# Patient Record
Sex: Female | Born: 1955 | Race: White | Hispanic: No | Smoking: Current every day smoker
Health system: Southern US, Community
[De-identification: ages and names within clinical notes are randomized; demographics above are authoritative.]

## PROBLEM LIST (undated history)

## (undated) DIAGNOSIS — D509 Iron deficiency anemia, unspecified: Secondary | ICD-10-CM

## (undated) DIAGNOSIS — J449 Chronic obstructive pulmonary disease, unspecified: Secondary | ICD-10-CM

## (undated) DIAGNOSIS — K589 Irritable bowel syndrome without diarrhea: Secondary | ICD-10-CM

## (undated) DIAGNOSIS — M545 Low back pain, unspecified: Secondary | ICD-10-CM

## (undated) DIAGNOSIS — R51 Headache: Secondary | ICD-10-CM

## (undated) DIAGNOSIS — F429 Obsessive-compulsive disorder, unspecified: Secondary | ICD-10-CM

## (undated) DIAGNOSIS — G8929 Other chronic pain: Secondary | ICD-10-CM

## (undated) DIAGNOSIS — K635 Polyp of colon: Secondary | ICD-10-CM

## (undated) DIAGNOSIS — M1990 Unspecified osteoarthritis, unspecified site: Secondary | ICD-10-CM

## (undated) DIAGNOSIS — F419 Anxiety disorder, unspecified: Secondary | ICD-10-CM

## (undated) DIAGNOSIS — Z9981 Dependence on supplemental oxygen: Secondary | ICD-10-CM

## (undated) DIAGNOSIS — R519 Headache, unspecified: Secondary | ICD-10-CM

## (undated) DIAGNOSIS — Z8489 Family history of other specified conditions: Secondary | ICD-10-CM

## (undated) DIAGNOSIS — F329 Major depressive disorder, single episode, unspecified: Secondary | ICD-10-CM

## (undated) DIAGNOSIS — K7460 Unspecified cirrhosis of liver: Secondary | ICD-10-CM

## (undated) DIAGNOSIS — J9610 Chronic respiratory failure, unspecified whether with hypoxia or hypercapnia: Secondary | ICD-10-CM

## (undated) DIAGNOSIS — I5033 Acute on chronic diastolic (congestive) heart failure: Secondary | ICD-10-CM

## (undated) DIAGNOSIS — F32A Depression, unspecified: Secondary | ICD-10-CM

## (undated) DIAGNOSIS — E119 Type 2 diabetes mellitus without complications: Secondary | ICD-10-CM

## (undated) DIAGNOSIS — F410 Panic disorder [episodic paroxysmal anxiety] without agoraphobia: Secondary | ICD-10-CM

## (undated) DIAGNOSIS — R011 Cardiac murmur, unspecified: Secondary | ICD-10-CM

## (undated) DIAGNOSIS — J189 Pneumonia, unspecified organism: Secondary | ICD-10-CM

## (undated) DIAGNOSIS — Z87891 Personal history of nicotine dependence: Secondary | ICD-10-CM

## (undated) DIAGNOSIS — E7800 Pure hypercholesterolemia, unspecified: Secondary | ICD-10-CM

## (undated) DIAGNOSIS — J42 Unspecified chronic bronchitis: Secondary | ICD-10-CM

## (undated) DIAGNOSIS — Z9289 Personal history of other medical treatment: Secondary | ICD-10-CM

## (undated) DIAGNOSIS — J45909 Unspecified asthma, uncomplicated: Secondary | ICD-10-CM

## (undated) DIAGNOSIS — K219 Gastro-esophageal reflux disease without esophagitis: Secondary | ICD-10-CM

## (undated) DIAGNOSIS — G4730 Sleep apnea, unspecified: Secondary | ICD-10-CM

## (undated) DIAGNOSIS — F4310 Post-traumatic stress disorder, unspecified: Secondary | ICD-10-CM

## (undated) DIAGNOSIS — D649 Anemia, unspecified: Secondary | ICD-10-CM

## (undated) DIAGNOSIS — I10 Essential (primary) hypertension: Secondary | ICD-10-CM

## (undated) HISTORY — DX: Depression, unspecified: F32.A

## (undated) HISTORY — DX: Anemia, unspecified: D64.9

## (undated) HISTORY — PX: UPPER GASTROINTESTINAL ENDOSCOPY: SHX188

## (undated) HISTORY — DX: Iron deficiency anemia, unspecified: D50.9

## (undated) HISTORY — DX: Irritable bowel syndrome, unspecified: K58.9

## (undated) HISTORY — DX: Personal history of nicotine dependence: Z87.891

## (undated) HISTORY — DX: Gastro-esophageal reflux disease without esophagitis: K21.9

## (undated) HISTORY — DX: Chronic obstructive pulmonary disease, unspecified: J44.9

## (undated) HISTORY — PX: BACK SURGERY: SHX140

## (undated) HISTORY — DX: Polyp of colon: K63.5

## (undated) HISTORY — DX: Unspecified cirrhosis of liver: K74.60

## (undated) HISTORY — DX: Essential (primary) hypertension: I10

## (undated) HISTORY — DX: Major depressive disorder, single episode, unspecified: F32.9

## (undated) HISTORY — PX: DILATION AND CURETTAGE OF UTERUS: SHX78

---

## 1977-02-23 HISTORY — PX: TUBAL LIGATION: SHX77

## 2003-02-24 HISTORY — PX: CATARACT EXTRACTION W/ INTRAOCULAR LENS  IMPLANT, BILATERAL: SHX1307

## 2003-11-25 ENCOUNTER — Emergency Department: Payer: Self-pay | Admitting: Emergency Medicine

## 2003-12-18 ENCOUNTER — Ambulatory Visit: Payer: Self-pay | Admitting: Ophthalmology

## 2003-12-25 ENCOUNTER — Ambulatory Visit: Payer: Self-pay

## 2004-01-12 ENCOUNTER — Emergency Department: Payer: Self-pay | Admitting: Emergency Medicine

## 2004-01-13 ENCOUNTER — Emergency Department: Payer: Self-pay | Admitting: Unknown Physician Specialty

## 2004-01-22 ENCOUNTER — Ambulatory Visit: Payer: Self-pay | Admitting: Ophthalmology

## 2007-02-24 HISTORY — PX: INCISION AND DRAINAGE OF WOUND: SHX1803

## 2007-05-26 ENCOUNTER — Other Ambulatory Visit: Payer: Self-pay

## 2007-05-26 ENCOUNTER — Inpatient Hospital Stay: Payer: Self-pay | Admitting: Internal Medicine

## 2009-04-04 ENCOUNTER — Emergency Department: Payer: Self-pay | Admitting: Unknown Physician Specialty

## 2009-11-07 ENCOUNTER — Emergency Department: Payer: Self-pay | Admitting: Unknown Physician Specialty

## 2009-12-24 ENCOUNTER — Ambulatory Visit: Payer: Self-pay | Admitting: Internal Medicine

## 2010-04-16 ENCOUNTER — Ambulatory Visit: Payer: Self-pay | Admitting: Gastroenterology

## 2010-05-16 ENCOUNTER — Emergency Department: Payer: Self-pay | Admitting: Emergency Medicine

## 2010-06-15 ENCOUNTER — Inpatient Hospital Stay: Payer: Self-pay | Admitting: Internal Medicine

## 2010-06-25 ENCOUNTER — Emergency Department: Payer: Self-pay | Admitting: Emergency Medicine

## 2010-07-09 ENCOUNTER — Ambulatory Visit: Payer: Self-pay | Admitting: Gastroenterology

## 2011-05-21 ENCOUNTER — Ambulatory Visit: Payer: Self-pay | Admitting: Internal Medicine

## 2011-08-13 ENCOUNTER — Other Ambulatory Visit: Payer: Self-pay | Admitting: Internal Medicine

## 2011-08-13 LAB — BASIC METABOLIC PANEL
Creatinine: 1.05 mg/dL (ref 0.60–1.30)
EGFR (African American): >60
Glucose: 203 mg/dL — ABNORMAL HIGH (ref 65–99)
Osmolality: 256 (ref 275–301)

## 2011-08-14 ENCOUNTER — Other Ambulatory Visit: Payer: Self-pay | Admitting: Internal Medicine

## 2011-08-14 LAB — BASIC METABOLIC PANEL
BUN: 16 mg/dL (ref 7–18)
Calcium, Total: 8.2 mg/dL — ABNORMAL LOW (ref 8.5–10.1)
Chloride: 87 mmol/L — ABNORMAL LOW (ref 98–107)
Creatinine: 1.13 mg/dL (ref 0.60–1.30)
EGFR (Non-African Amer.): 54 — ABNORMAL LOW
Glucose: 244 mg/dL — ABNORMAL HIGH (ref 65–99)
Osmolality: 255 (ref 275–301)
Potassium: 4.7 mmol/L (ref 3.5–5.1)

## 2011-08-17 ENCOUNTER — Inpatient Hospital Stay: Payer: Self-pay | Admitting: Internal Medicine

## 2011-08-17 LAB — SEDIMENTATION RATE: Erythrocyte Sed Rate: 58 mm/hr — ABNORMAL HIGH (ref 0–30)

## 2011-08-17 LAB — TSH: Thyroid Stimulating Horm: 1.9 u[IU]/mL

## 2011-08-17 LAB — URINALYSIS, COMPLETE
Bilirubin,UR: NEGATIVE
Blood: NEGATIVE
Ketone: NEGATIVE
Nitrite: NEGATIVE
Ph: 7 (ref 4.5–8.0)
Protein: NEGATIVE
RBC,UR: 1 /HPF (ref 0–5)
Squamous Epithelial: 2

## 2011-08-17 LAB — CBC
MCH: 29.4 pg (ref 26.0–34.0)
MCHC: 33.1 g/dL (ref 32.0–36.0)
MCV: 89 fL (ref 80–100)
Platelet: 208 10*3/uL (ref 150–440)
RBC: 3.77 10*6/uL — ABNORMAL LOW (ref 3.80–5.20)

## 2011-08-17 LAB — HEPATIC FUNCTION PANEL A (ARMC)
Albumin: 3.1 g/dL — ABNORMAL LOW (ref 3.4–5.0)
Bilirubin,Total: 0.7 mg/dL (ref 0.2–1.0)
SGOT(AST): 27 U/L (ref 15–37)
SGPT (ALT): 17 U/L

## 2011-08-17 LAB — BASIC METABOLIC PANEL
Anion Gap: 7 (ref 7–16)
Calcium, Total: 8.8 mg/dL (ref 8.5–10.1)
Co2: 28 mmol/L (ref 21–32)
Creatinine: 1.2 mg/dL (ref 0.60–1.30)
EGFR (African American): 58 — ABNORMAL LOW
Potassium: 4.2 mmol/L (ref 3.5–5.1)

## 2011-08-17 LAB — TROPONIN I: Troponin-I: <0.02 ng/mL

## 2011-08-17 LAB — CK TOTAL AND CKMB (NOT AT ARMC)
CK, Total: 30 U/L (ref 21–215)
CK-MB: 1 ng/mL (ref 0.5–3.6)

## 2011-08-18 LAB — MAGNESIUM: Magnesium: 1.4 mg/dL — ABNORMAL LOW

## 2011-08-18 LAB — BASIC METABOLIC PANEL
Anion Gap: 4 — ABNORMAL LOW (ref 7–16)
BUN: 14 mg/dL (ref 7–18)
Calcium, Total: 8 mg/dL — ABNORMAL LOW (ref 8.5–10.1)
Chloride: 91 mmol/L — ABNORMAL LOW (ref 98–107)
Co2: 28 mmol/L (ref 21–32)
Creatinine: 1.01 mg/dL (ref 0.60–1.30)
EGFR (African American): >60
EGFR (Non-African Amer.): >60
Glucose: 138 mg/dL — ABNORMAL HIGH (ref 65–99)
Osmolality: 250 (ref 275–301)
Potassium: 4.1 mmol/L (ref 3.5–5.1)
Sodium: 123 mmol/L — ABNORMAL LOW (ref 136–145)

## 2011-08-18 LAB — CBC WITH DIFFERENTIAL/PLATELET
Basophil #: 0 10*3/uL (ref 0.0–0.1)
Basophil %: 0.3 %
Eosinophil #: 0.1 10*3/uL (ref 0.0–0.7)
Eosinophil %: 1.2 %
HCT: 28.2 % — ABNORMAL LOW (ref 35.0–47.0)
Lymphocyte #: 0.5 10*3/uL — ABNORMAL LOW (ref 1.0–3.6)
Lymphocyte %: 12.4 %
MCHC: 34.2 g/dL (ref 32.0–36.0)
MCV: 87 fL (ref 80–100)
Monocyte #: 0.2 x10 3/mm (ref 0.2–0.9)
Monocyte %: 4.4 %
Neutrophil #: 3.6 10*3/uL (ref 1.4–6.5)
Neutrophil %: 81.7 %
WBC: 4.4 10*3/uL (ref 3.6–11.0)

## 2011-08-18 LAB — TSH: Thyroid Stimulating Horm: 0.984 u[IU]/mL

## 2011-08-18 LAB — POTASSIUM, URINE RANDOM: Potassium, Urine Random: 3 mmol/L — ABNORMAL LOW (ref 55–125)

## 2011-08-18 LAB — OSMOLALITY, URINE: Osmolality: 145 mOsm/kg

## 2011-08-18 LAB — CHLORIDE, URINE, RANDOM: Chloride, Urine Random: 42 mmol/L — ABNORMAL LOW (ref 55–125)

## 2011-08-19 LAB — BASIC METABOLIC PANEL
Anion Gap: 8 (ref 7–16)
BUN: 11 mg/dL (ref 7–18)
Calcium, Total: 8.4 mg/dL — ABNORMAL LOW (ref 8.5–10.1)
Creatinine: 0.92 mg/dL (ref 0.60–1.30)
EGFR (African American): >60
EGFR (Non-African Amer.): >60
Glucose: 128 mg/dL — ABNORMAL HIGH (ref 65–99)
Potassium: 4.6 mmol/L (ref 3.5–5.1)

## 2011-08-19 LAB — MAGNESIUM: Magnesium: 1.4 mg/dL — ABNORMAL LOW

## 2011-08-19 LAB — IRON AND TIBC: Iron Saturation: 7 %

## 2011-08-19 LAB — URIC ACID: Uric Acid: 3.4 mg/dL (ref 2.6–6.0)

## 2011-08-21 LAB — PROTEIN ELECTROPHORESIS(ARMC)

## 2011-08-21 LAB — KAPPA/LAMBDA FREE LIGHT CHAINS (ARMC)

## 2011-10-16 ENCOUNTER — Other Ambulatory Visit: Payer: Self-pay | Admitting: Internal Medicine

## 2011-10-16 ENCOUNTER — Ambulatory Visit: Payer: Self-pay | Admitting: Internal Medicine

## 2011-10-16 ENCOUNTER — Inpatient Hospital Stay: Payer: Self-pay | Admitting: Internal Medicine

## 2011-10-16 LAB — CBC WITH DIFFERENTIAL/PLATELET
Eosinophil #: 0.1 10*3/uL (ref 0.0–0.7)
HCT: 24.5 % — ABNORMAL LOW (ref 35.0–47.0)
Lymphocyte #: 1 10*3/uL (ref 1.0–3.6)
Lymphocyte %: 22.5 %
MCH: 22.7 pg — ABNORMAL LOW (ref 26.0–34.0)
MCV: 74 fL — ABNORMAL LOW (ref 80–100)
Monocyte #: 0.3 x10 3/mm (ref 0.2–0.9)
Monocyte %: 6.2 %
RBC: 3.32 10*6/uL — ABNORMAL LOW (ref 3.80–5.20)
RDW: 17.5 % — ABNORMAL HIGH (ref 11.5–14.5)

## 2011-10-16 LAB — IRON AND TIBC
Iron Bind.Cap.(Total): 412 ug/dL (ref 250–450)
Iron Saturation: 5 %
Iron: 22 ug/dL — ABNORMAL LOW (ref 50–170)
Unbound Iron-Bind.Cap.: 390 ug/dL

## 2011-10-16 LAB — RETICULOCYTES: Absolute Retic Count: 0.0809 10*6/uL (ref 0.023–0.096)

## 2011-10-17 LAB — HEMOGLOBIN
HGB: 8.2 g/dL — ABNORMAL LOW (ref 12.0–16.0)
HGB: 9.3 g/dL — ABNORMAL LOW (ref 12.0–16.0)

## 2011-10-22 ENCOUNTER — Ambulatory Visit: Payer: Self-pay | Admitting: Internal Medicine

## 2011-10-22 LAB — CREATININE, SERUM
Creatinine: 1.04 mg/dL (ref 0.60–1.30)
EGFR (Non-African Amer.): 60 — ABNORMAL LOW

## 2011-10-22 LAB — CANCER CENTER HEMOGLOBIN: HGB: 9.6 g/dL — ABNORMAL LOW (ref 12.0–16.0)

## 2011-10-23 LAB — SODIUM: Sodium: 137 mmol/L (ref 136–145)

## 2011-10-25 ENCOUNTER — Ambulatory Visit: Payer: Self-pay | Admitting: Internal Medicine

## 2011-11-24 ENCOUNTER — Ambulatory Visit: Payer: Self-pay | Admitting: Internal Medicine

## 2011-12-14 ENCOUNTER — Ambulatory Visit: Payer: Self-pay | Admitting: Internal Medicine

## 2011-12-14 LAB — CANCER CENTER HEMOGLOBIN: HGB: 9.9 g/dL — ABNORMAL LOW (ref 12.0–16.0)

## 2011-12-22 ENCOUNTER — Other Ambulatory Visit: Payer: Self-pay | Admitting: Internal Medicine

## 2011-12-22 LAB — BASIC METABOLIC PANEL
BUN: 12 mg/dL (ref 7–18)
Chloride: 99 mmol/L (ref 98–107)
Co2: 25 mmol/L (ref 21–32)
Creatinine: 1.01 mg/dL (ref 0.60–1.30)
Osmolality: 271 (ref 275–301)
Potassium: 4.6 mmol/L (ref 3.5–5.1)
Sodium: 134 mmol/L — ABNORMAL LOW (ref 136–145)

## 2011-12-25 ENCOUNTER — Ambulatory Visit: Payer: Self-pay

## 2011-12-25 ENCOUNTER — Ambulatory Visit: Payer: Self-pay | Admitting: Internal Medicine

## 2012-01-24 ENCOUNTER — Ambulatory Visit: Payer: Self-pay | Admitting: Internal Medicine

## 2012-02-08 LAB — CANCER CENTER HEMOGLOBIN: HGB: 12.4 g/dL (ref 12.0–16.0)

## 2012-02-08 LAB — FERRITIN: Ferritin (ARMC): 18 ng/mL (ref 8–388)

## 2012-02-24 ENCOUNTER — Ambulatory Visit: Payer: Self-pay | Admitting: Internal Medicine

## 2012-02-24 HISTORY — PX: COLONOSCOPY: SHX174

## 2012-02-28 ENCOUNTER — Emergency Department: Payer: Self-pay | Admitting: Internal Medicine

## 2012-02-28 LAB — CK TOTAL AND CKMB (NOT AT ARMC): CK-MB: 1.6 ng/mL (ref 0.5–3.6)

## 2012-02-28 LAB — CBC
HGB: 11.3 g/dL — ABNORMAL LOW (ref 12.0–16.0)
MCH: 28.8 pg (ref 26.0–34.0)
MCHC: 32.7 g/dL (ref 32.0–36.0)
MCV: 88 fL (ref 80–100)
Platelet: 137 10*3/uL — ABNORMAL LOW (ref 150–440)
RBC: 3.91 10*6/uL (ref 3.80–5.20)
RDW: 14.1 % (ref 11.5–14.5)
WBC: 4.3 10*3/uL (ref 3.6–11.0)

## 2012-02-28 LAB — BASIC METABOLIC PANEL
BUN: 9 mg/dL (ref 7–18)
Calcium, Total: 8.3 mg/dL — ABNORMAL LOW (ref 8.5–10.1)
EGFR (Non-African Amer.): >60
Glucose: 190 mg/dL — ABNORMAL HIGH (ref 65–99)
Osmolality: 272 (ref 275–301)

## 2012-02-28 LAB — TROPONIN I: Troponin-I: <0.02 ng/mL

## 2012-03-21 LAB — CBC CANCER CENTER
Basophil #: 0 x10 3/mm (ref 0.0–0.1)
Basophil %: 1 %
Eosinophil %: 1.1 %
HCT: 34.7 % — ABNORMAL LOW (ref 35.0–47.0)
HGB: 11.3 g/dL — ABNORMAL LOW (ref 12.0–16.0)
Lymphocyte #: 0.6 x10 3/mm — ABNORMAL LOW (ref 1.0–3.6)
Lymphocyte %: 13.1 %
MCH: 27.4 pg (ref 26.0–34.0)
MCV: 84 fL (ref 80–100)
Monocyte #: 0.2 x10 3/mm (ref 0.2–0.9)
Monocyte %: 3.9 %
Neutrophil #: 3.9 x10 3/mm (ref 1.4–6.5)
Platelet: 133 x10 3/mm — ABNORMAL LOW (ref 150–440)
RDW: 14.6 % — ABNORMAL HIGH (ref 11.5–14.5)
WBC: 4.8 x10 3/mm (ref 3.6–11.0)

## 2012-03-21 LAB — FERRITIN: Ferritin (ARMC): 11 ng/mL (ref 8–388)

## 2012-03-26 ENCOUNTER — Ambulatory Visit: Payer: Self-pay | Admitting: Internal Medicine

## 2012-04-12 ENCOUNTER — Ambulatory Visit: Payer: Self-pay | Admitting: Vascular Surgery

## 2012-04-18 LAB — CBC CANCER CENTER
Basophil %: 0.8 %
Eosinophil #: 0 x10 3/mm (ref 0.0–0.7)
Eosinophil %: 0.8 %
HCT: 39.3 % (ref 35.0–47.0)
Lymphocyte #: 0.8 x10 3/mm — ABNORMAL LOW (ref 1.0–3.6)
Lymphocyte %: 14.8 %
MCHC: 32.7 g/dL (ref 32.0–36.0)
MCV: 88 fL (ref 80–100)
Monocyte #: 0.3 x10 3/mm (ref 0.2–0.9)
Neutrophil #: 4 x10 3/mm (ref 1.4–6.5)
Platelet: 140 x10 3/mm — ABNORMAL LOW (ref 150–440)
RDW: 19 % — ABNORMAL HIGH (ref 11.5–14.5)
WBC: 5.2 x10 3/mm (ref 3.6–11.0)

## 2012-04-23 ENCOUNTER — Ambulatory Visit: Payer: Self-pay | Admitting: Internal Medicine

## 2012-05-24 ENCOUNTER — Ambulatory Visit: Payer: Self-pay | Admitting: Internal Medicine

## 2012-05-25 LAB — CBC CANCER CENTER
Eosinophil #: 0 x10 3/mm (ref 0.0–0.7)
HCT: 31.3 % — ABNORMAL LOW (ref 35.0–47.0)
Lymphocyte #: 0.7 x10 3/mm — ABNORMAL LOW (ref 1.0–3.6)
Lymphocyte %: 17.8 %
MCH: 27 pg (ref 26.0–34.0)
MCHC: 32 g/dL (ref 32.0–36.0)
MCV: 84 fL (ref 80–100)
Monocyte #: 0.2 x10 3/mm (ref 0.2–0.9)
Neutrophil #: 2.9 x10 3/mm (ref 1.4–6.5)
Neutrophil %: 74.7 %
Platelet: 115 x10 3/mm — ABNORMAL LOW (ref 150–440)
RDW: 17.1 % — ABNORMAL HIGH (ref 11.5–14.5)
WBC: 3.9 x10 3/mm (ref 3.6–11.0)

## 2012-05-25 LAB — FERRITIN: Ferritin (ARMC): 10 ng/mL (ref 8–388)

## 2012-05-26 ENCOUNTER — Emergency Department: Payer: Self-pay | Admitting: Emergency Medicine

## 2012-05-26 LAB — URINALYSIS, COMPLETE
Blood: NEGATIVE
Nitrite: NEGATIVE
Ph: 7 (ref 4.5–8.0)
Protein: 30
RBC,UR: 1 /HPF (ref 0–5)
Specific Gravity: 1.004 (ref 1.003–1.030)
WBC UR: <1 /HPF (ref 0–5)

## 2012-05-26 LAB — COMPREHENSIVE METABOLIC PANEL
Albumin: 3.1 g/dL — ABNORMAL LOW (ref 3.4–5.0)
Alkaline Phosphatase: 187 U/L — ABNORMAL HIGH (ref 50–136)
Anion Gap: 4 — ABNORMAL LOW (ref 7–16)
BUN: 10 mg/dL (ref 7–18)
Calcium, Total: 8.6 mg/dL (ref 8.5–10.1)
EGFR (Non-African Amer.): >60
Osmolality: 270 (ref 275–301)
Potassium: 4.2 mmol/L (ref 3.5–5.1)
SGOT(AST): 38 U/L — ABNORMAL HIGH (ref 15–37)
SGPT (ALT): 20 U/L (ref 12–78)
Sodium: 135 mmol/L — ABNORMAL LOW (ref 136–145)
Total Protein: 7.1 g/dL (ref 6.4–8.2)

## 2012-05-26 LAB — CBC
HCT: 34.7 % — ABNORMAL LOW (ref 35.0–47.0)
HGB: 11.3 g/dL — ABNORMAL LOW (ref 12.0–16.0)
MCH: 27.4 pg (ref 26.0–34.0)
MCV: 84 fL (ref 80–100)
Platelet: 133 10*3/uL — ABNORMAL LOW (ref 150–440)
RBC: 4.11 10*6/uL (ref 3.80–5.20)

## 2012-05-26 LAB — LIPASE, BLOOD: Lipase: 143 U/L (ref 73–393)

## 2012-06-22 LAB — CBC CANCER CENTER
Basophil %: 1 %
Eosinophil #: 0 x10 3/mm (ref 0.0–0.7)
Eosinophil %: 1.1 %
HCT: 38.5 % (ref 35.0–47.0)
Lymphocyte #: 0.6 x10 3/mm — ABNORMAL LOW (ref 1.0–3.6)
MCH: 28.4 pg (ref 26.0–34.0)
MCHC: 32.3 g/dL (ref 32.0–36.0)
MCV: 88 fL (ref 80–100)
Monocyte %: 5.8 %
Neutrophil #: 3 x10 3/mm (ref 1.4–6.5)
RBC: 4.38 10*6/uL (ref 3.80–5.20)
RDW: 19.5 % — ABNORMAL HIGH (ref 11.5–14.5)

## 2012-06-22 LAB — FERRITIN: Ferritin (ARMC): 24 ng/mL (ref 8–388)

## 2012-06-23 ENCOUNTER — Ambulatory Visit: Payer: Self-pay | Admitting: Internal Medicine

## 2012-07-13 ENCOUNTER — Ambulatory Visit: Payer: Self-pay | Admitting: Internal Medicine

## 2012-07-13 LAB — CANCER CENTER HEMOGLOBIN: HGB: 11.5 g/dL — ABNORMAL LOW (ref 12.0–16.0)

## 2012-07-24 ENCOUNTER — Ambulatory Visit: Payer: Self-pay | Admitting: Internal Medicine

## 2012-08-03 ENCOUNTER — Ambulatory Visit: Payer: Self-pay | Admitting: Internal Medicine

## 2012-08-03 LAB — FERRITIN: Ferritin (ARMC): 9 ng/mL (ref 8–388)

## 2012-08-10 ENCOUNTER — Ambulatory Visit: Payer: Self-pay | Admitting: Gastroenterology

## 2012-08-10 LAB — CBC WITH DIFFERENTIAL/PLATELET
Basophil #: 0 10*3/uL (ref 0.0–0.1)
Basophil %: 0.8 %
Eosinophil #: 0 10*3/uL (ref 0.0–0.7)
Eosinophil %: 0.9 %
HGB: 10.8 g/dL — ABNORMAL LOW (ref 12.0–16.0)
Lymphocyte #: 0.7 10*3/uL — ABNORMAL LOW (ref 1.0–3.6)
Lymphocyte %: 20.2 %
MCHC: 33.1 g/dL (ref 32.0–36.0)
MCV: 87 fL (ref 80–100)
Neutrophil #: 2.6 10*3/uL (ref 1.4–6.5)
RDW: 17.5 % — ABNORMAL HIGH (ref 11.5–14.5)
WBC: 3.5 10*3/uL — ABNORMAL LOW (ref 3.6–11.0)

## 2012-08-10 LAB — FERRITIN: Ferritin (ARMC): 140 ng/mL (ref 8–388)

## 2012-08-10 LAB — HEPATIC FUNCTION PANEL A (ARMC)
Albumin: 3 g/dL — ABNORMAL LOW (ref 3.4–5.0)
Alkaline Phosphatase: 139 U/L — ABNORMAL HIGH (ref 50–136)
Bilirubin, Direct: 0.1 mg/dL (ref 0.00–0.20)
Bilirubin,Total: 0.4 mg/dL (ref 0.2–1.0)
SGPT (ALT): 17 U/L (ref 12–78)
Total Protein: 6.2 g/dL — ABNORMAL LOW (ref 6.4–8.2)

## 2012-08-10 LAB — IRON AND TIBC
Iron Bind.Cap.(Total): 287 ug/dL (ref 250–450)
Iron Saturation: 17 %
Iron: 50 ug/dL (ref 50–170)
Unbound Iron-Bind.Cap.: 237 ug/dL

## 2012-08-15 ENCOUNTER — Ambulatory Visit: Payer: Self-pay | Admitting: Gastroenterology

## 2012-08-23 ENCOUNTER — Ambulatory Visit: Payer: Self-pay | Admitting: Internal Medicine

## 2012-08-31 ENCOUNTER — Ambulatory Visit: Payer: Self-pay | Admitting: Specialist

## 2012-08-31 LAB — BASIC METABOLIC PANEL
Anion Gap: 8 (ref 7–16)
BUN: 9 mg/dL (ref 7–18)
Chloride: 101 mmol/L (ref 98–107)
Creatinine: 1.06 mg/dL (ref 0.60–1.30)
EGFR (African American): >60
EGFR (Non-African Amer.): 58 — ABNORMAL LOW
Glucose: 176 mg/dL — ABNORMAL HIGH (ref 65–99)
Osmolality: 275 (ref 275–301)
Potassium: 3.8 mmol/L (ref 3.5–5.1)
Sodium: 136 mmol/L (ref 136–145)

## 2012-08-31 LAB — HEMOGLOBIN: HGB: 11.7 g/dL — ABNORMAL LOW (ref 12.0–16.0)

## 2012-09-05 ENCOUNTER — Ambulatory Visit: Payer: Self-pay | Admitting: Internal Medicine

## 2012-09-05 LAB — CANCER CENTER HEMOGLOBIN: HGB: 11.1 g/dL — ABNORMAL LOW (ref 12.0–16.0)

## 2012-09-05 LAB — FERRITIN: Ferritin (ARMC): 38 ng/mL (ref 8–388)

## 2012-09-06 ENCOUNTER — Ambulatory Visit: Payer: Self-pay | Admitting: Specialist

## 2012-09-23 ENCOUNTER — Ambulatory Visit: Payer: Self-pay | Admitting: Internal Medicine

## 2012-10-12 LAB — FERRITIN: Ferritin (ARMC): 10 ng/mL (ref 8–388)

## 2012-10-12 LAB — CANCER CENTER HEMOGLOBIN: HGB: 11.1 g/dL — ABNORMAL LOW (ref 12.0–16.0)

## 2012-10-24 ENCOUNTER — Ambulatory Visit: Payer: Self-pay | Admitting: Internal Medicine

## 2012-11-09 LAB — CANCER CENTER HEMOGLOBIN: HGB: 12.3 g/dL (ref 12.0–16.0)

## 2012-11-09 LAB — FERRITIN: Ferritin (ARMC): 24 ng/mL (ref 8–388)

## 2012-11-23 ENCOUNTER — Ambulatory Visit: Payer: Self-pay | Admitting: Internal Medicine

## 2012-12-05 DIAGNOSIS — F332 Major depressive disorder, recurrent severe without psychotic features: Secondary | ICD-10-CM | POA: Insufficient documentation

## 2012-12-05 DIAGNOSIS — F4310 Post-traumatic stress disorder, unspecified: Secondary | ICD-10-CM | POA: Insufficient documentation

## 2012-12-07 LAB — CANCER CENTER HEMOGLOBIN: HGB: 10.5 g/dL — ABNORMAL LOW (ref 12.0–16.0)

## 2012-12-07 LAB — FERRITIN: Ferritin (ARMC): 11 ng/mL (ref 8–388)

## 2012-12-24 ENCOUNTER — Ambulatory Visit: Payer: Self-pay | Admitting: Internal Medicine

## 2013-01-04 LAB — FERRITIN: Ferritin (ARMC): 20 ng/mL (ref 8–388)

## 2013-01-04 LAB — CANCER CENTER HEMOGLOBIN: HGB: 11.4 g/dL — ABNORMAL LOW (ref 12.0–16.0)

## 2013-01-23 ENCOUNTER — Ambulatory Visit: Payer: Self-pay | Admitting: Internal Medicine

## 2013-02-01 ENCOUNTER — Ambulatory Visit: Payer: Self-pay | Admitting: Internal Medicine

## 2013-02-01 LAB — FERRITIN: Ferritin (ARMC): 9 ng/mL (ref 8–388)

## 2013-02-01 LAB — CANCER CENTER HEMOGLOBIN: HGB: 9.5 g/dL — ABNORMAL LOW (ref 12.0–16.0)

## 2013-02-14 ENCOUNTER — Emergency Department: Payer: Self-pay | Admitting: Emergency Medicine

## 2013-02-18 ENCOUNTER — Emergency Department: Payer: Self-pay | Admitting: Emergency Medicine

## 2013-02-23 ENCOUNTER — Ambulatory Visit: Payer: Self-pay | Admitting: Internal Medicine

## 2013-02-23 HISTORY — PX: POSTERIOR FUSION CERVICAL SPINE: SUR628

## 2013-03-01 ENCOUNTER — Ambulatory Visit: Payer: Self-pay | Admitting: Internal Medicine

## 2013-03-01 LAB — FERRITIN: Ferritin (ARMC): 26 ng/mL (ref 8–388)

## 2013-03-01 LAB — CANCER CENTER HEMOGLOBIN: HGB: 9.7 g/dL — ABNORMAL LOW (ref 12.0–16.0)

## 2013-03-06 ENCOUNTER — Emergency Department: Payer: Self-pay | Admitting: Emergency Medicine

## 2013-03-06 LAB — COMPREHENSIVE METABOLIC PANEL
ALBUMIN: 2.6 g/dL — AB (ref 3.4–5.0)
Alkaline Phosphatase: 224 U/L — ABNORMAL HIGH
Anion Gap: 0 — ABNORMAL LOW (ref 7–16)
BUN: 8 mg/dL (ref 7–18)
Bilirubin,Total: 0.3 mg/dL (ref 0.2–1.0)
CALCIUM: 7.7 mg/dL — AB (ref 8.5–10.1)
CREATININE: 0.88 mg/dL (ref 0.60–1.30)
Chloride: 99 mmol/L (ref 98–107)
Co2: 38 mmol/L — ABNORMAL HIGH (ref 21–32)
EGFR (African American): >60
GLUCOSE: 61 mg/dL — AB (ref 65–99)
OSMOLALITY: 270 (ref 275–301)
Potassium: 2.7 mmol/L — ABNORMAL LOW (ref 3.5–5.1)
SGOT(AST): 30 U/L (ref 15–37)
SGPT (ALT): 13 U/L (ref 12–78)
Sodium: 137 mmol/L (ref 136–145)
Total Protein: 6.1 g/dL — ABNORMAL LOW (ref 6.4–8.2)

## 2013-03-11 DIAGNOSIS — M4802 Spinal stenosis, cervical region: Secondary | ICD-10-CM | POA: Insufficient documentation

## 2013-03-26 ENCOUNTER — Ambulatory Visit: Payer: Self-pay | Admitting: Internal Medicine

## 2013-04-24 ENCOUNTER — Ambulatory Visit: Payer: Self-pay | Admitting: Internal Medicine

## 2013-04-24 LAB — FERRITIN: FERRITIN (ARMC): 16 ng/mL (ref 8–388)

## 2013-04-24 LAB — CANCER CENTER HEMOGLOBIN: HGB: 10.6 g/dL — AB (ref 12.0–16.0)

## 2013-05-22 LAB — CBC CANCER CENTER
BASOS ABS: 0 x10 3/mm (ref 0.0–0.1)
Basophil %: 1 %
EOS ABS: 0 x10 3/mm (ref 0.0–0.7)
EOS PCT: 0.6 %
HCT: 37.1 % (ref 35.0–47.0)
HGB: 11.9 g/dL — ABNORMAL LOW (ref 12.0–16.0)
LYMPHS ABS: 0.8 x10 3/mm — AB (ref 1.0–3.6)
Lymphocyte %: 18.4 %
MCH: 30.6 pg (ref 26.0–34.0)
MCHC: 32 g/dL (ref 32.0–36.0)
MCV: 96 fL (ref 80–100)
MONOS PCT: 4.8 %
Monocyte #: 0.2 x10 3/mm (ref 0.2–0.9)
NEUTROS ABS: 3.2 x10 3/mm (ref 1.4–6.5)
Neutrophil %: 75.2 %
PLATELETS: 122 x10 3/mm — AB (ref 150–440)
RBC: 3.88 10*6/uL (ref 3.80–5.20)
RDW: 21 % — AB (ref 11.5–14.5)
WBC: 4.3 x10 3/mm (ref 3.6–11.0)

## 2013-05-24 ENCOUNTER — Ambulatory Visit: Payer: Self-pay | Admitting: Internal Medicine

## 2013-05-26 ENCOUNTER — Emergency Department: Payer: Self-pay | Admitting: Emergency Medicine

## 2013-05-27 ENCOUNTER — Ambulatory Visit: Payer: Self-pay | Admitting: Internal Medicine

## 2013-06-07 ENCOUNTER — Emergency Department: Payer: Self-pay | Admitting: Emergency Medicine

## 2013-06-21 LAB — HEMOGLOBIN: HGB: 11.7 g/dL — ABNORMAL LOW (ref 12.0–16.0)

## 2013-06-21 LAB — FERRITIN: Ferritin (ARMC): 16 ng/mL (ref 8–388)

## 2013-06-23 ENCOUNTER — Ambulatory Visit: Payer: Self-pay | Admitting: Internal Medicine

## 2013-07-04 ENCOUNTER — Ambulatory Visit: Payer: Self-pay | Admitting: Gastroenterology

## 2013-07-14 ENCOUNTER — Ambulatory Visit: Payer: Self-pay | Admitting: Internal Medicine

## 2013-07-19 LAB — CANCER CENTER HEMOGLOBIN: HGB: 12.1 g/dL (ref 12.0–16.0)

## 2013-07-19 LAB — FERRITIN: FERRITIN (ARMC): 35 ng/mL (ref 8–388)

## 2013-07-24 ENCOUNTER — Ambulatory Visit: Payer: Self-pay | Admitting: Internal Medicine

## 2013-08-14 ENCOUNTER — Ambulatory Visit: Payer: Self-pay | Admitting: Family Medicine

## 2013-08-30 ENCOUNTER — Ambulatory Visit: Payer: Self-pay | Admitting: Internal Medicine

## 2013-08-30 LAB — CANCER CENTER HEMOGLOBIN: HGB: 10.7 g/dL — ABNORMAL LOW (ref 12.0–16.0)

## 2013-08-30 LAB — FERRITIN: Ferritin (ARMC): 18 ng/mL (ref 8–388)

## 2013-09-07 ENCOUNTER — Ambulatory Visit: Payer: Self-pay | Admitting: Internal Medicine

## 2013-09-23 ENCOUNTER — Ambulatory Visit: Payer: Self-pay | Admitting: Internal Medicine

## 2013-10-21 ENCOUNTER — Emergency Department: Payer: Self-pay | Admitting: Emergency Medicine

## 2013-10-26 ENCOUNTER — Ambulatory Visit: Payer: Self-pay | Admitting: Internal Medicine

## 2013-10-26 LAB — FERRITIN: Ferritin (ARMC): 13 ng/mL (ref 8–388)

## 2013-10-26 LAB — CANCER CENTER HEMOGLOBIN: HGB: 9.5 g/dL — AB (ref 12.0–16.0)

## 2013-11-10 LAB — FERRITIN: FERRITIN (ARMC): 42 ng/mL (ref 8–388)

## 2013-11-10 LAB — CANCER CENTER HEMOGLOBIN: HGB: 9.8 g/dL — ABNORMAL LOW (ref 12.0–16.0)

## 2013-11-23 ENCOUNTER — Ambulatory Visit: Payer: Self-pay | Admitting: Internal Medicine

## 2013-12-22 LAB — COMPREHENSIVE METABOLIC PANEL
ALT: 14 U/L
ANION GAP: 6 — AB (ref 7–16)
AST: 24 U/L (ref 15–37)
Albumin: 2.8 g/dL — ABNORMAL LOW (ref 3.4–5.0)
Alkaline Phosphatase: 214 U/L — ABNORMAL HIGH
BUN: 14 mg/dL (ref 7–18)
Bilirubin,Total: 0.3 mg/dL (ref 0.2–1.0)
CHLORIDE: 95 mmol/L — AB (ref 98–107)
CREATININE: 0.98 mg/dL (ref 0.60–1.30)
Calcium, Total: 8.2 mg/dL — ABNORMAL LOW (ref 8.5–10.1)
Co2: 32 mmol/L (ref 21–32)
EGFR (African American): >60
EGFR (Non-African Amer.): >60
Glucose: 142 mg/dL — ABNORMAL HIGH (ref 65–99)
OSMOLALITY: 269 (ref 275–301)
Potassium: 3.8 mmol/L (ref 3.5–5.1)
SODIUM: 133 mmol/L — AB (ref 136–145)
Total Protein: 6.4 g/dL (ref 6.4–8.2)

## 2013-12-22 LAB — CBC CANCER CENTER
BASOS PCT: 1 %
Basophil #: 0 x10 3/mm (ref 0.0–0.1)
EOS ABS: 0.1 x10 3/mm (ref 0.0–0.7)
EOS PCT: 1.7 %
HCT: 30.1 % — AB (ref 35.0–47.0)
HGB: 9.6 g/dL — AB (ref 12.0–16.0)
LYMPHS ABS: 0.6 x10 3/mm — AB (ref 1.0–3.6)
LYMPHS PCT: 19.4 %
MCH: 30.3 pg (ref 26.0–34.0)
MCHC: 32 g/dL (ref 32.0–36.0)
MCV: 95 fL (ref 80–100)
MONOS PCT: 5.1 %
Monocyte #: 0.2 x10 3/mm (ref 0.2–0.9)
NEUTROS ABS: 2.2 x10 3/mm (ref 1.4–6.5)
Neutrophil %: 72.8 %
Platelet: 128 x10 3/mm — ABNORMAL LOW (ref 150–440)
RBC: 3.18 10*6/uL — ABNORMAL LOW (ref 3.80–5.20)
RDW: 16.9 % — AB (ref 11.5–14.5)
WBC: 3 x10 3/mm — ABNORMAL LOW (ref 3.6–11.0)

## 2013-12-22 LAB — FERRITIN: FERRITIN (ARMC): 14 ng/mL (ref 8–388)

## 2013-12-24 ENCOUNTER — Ambulatory Visit: Payer: Self-pay | Admitting: Internal Medicine

## 2014-01-12 LAB — CBC CANCER CENTER
BASOS ABS: 0 x10 3/mm (ref 0.0–0.1)
Basophil %: 0.7 %
Eosinophil #: 0 x10 3/mm (ref 0.0–0.7)
Eosinophil %: 1 %
HCT: 26.3 % — AB (ref 35.0–47.0)
HGB: 8.3 g/dL — ABNORMAL LOW (ref 12.0–16.0)
LYMPHS ABS: 0.5 x10 3/mm — AB (ref 1.0–3.6)
Lymphocyte %: 17.6 %
MCH: 28.5 pg (ref 26.0–34.0)
MCHC: 31.6 g/dL — ABNORMAL LOW (ref 32.0–36.0)
MCV: 90 fL (ref 80–100)
Monocyte #: 0.1 x10 3/mm — ABNORMAL LOW (ref 0.2–0.9)
Monocyte %: 5.4 %
Neutrophil #: 2.1 x10 3/mm (ref 1.4–6.5)
Neutrophil %: 75.3 %
PLATELETS: 113 x10 3/mm — AB (ref 150–440)
RBC: 2.91 10*6/uL — ABNORMAL LOW (ref 3.80–5.20)
RDW: 16.8 % — ABNORMAL HIGH (ref 11.5–14.5)
WBC: 2.8 x10 3/mm — AB (ref 3.6–11.0)

## 2014-01-12 LAB — FERRITIN: Ferritin (ARMC): 11 ng/mL (ref 8–388)

## 2014-01-20 ENCOUNTER — Inpatient Hospital Stay: Payer: Self-pay | Admitting: Internal Medicine

## 2014-01-20 LAB — COMPREHENSIVE METABOLIC PANEL
ALBUMIN: 2.7 g/dL — AB (ref 3.4–5.0)
AST: 17 U/L (ref 15–37)
Alkaline Phosphatase: 204 U/L — ABNORMAL HIGH
Anion Gap: 4 — ABNORMAL LOW (ref 7–16)
BILIRUBIN TOTAL: 0.5 mg/dL (ref 0.2–1.0)
BUN: 10 mg/dL (ref 7–18)
CALCIUM: 7.9 mg/dL — AB (ref 8.5–10.1)
CHLORIDE: 96 mmol/L — AB (ref 98–107)
CREATININE: 0.96 mg/dL (ref 0.60–1.30)
Co2: 34 mmol/L — ABNORMAL HIGH (ref 21–32)
Glucose: 107 mg/dL — ABNORMAL HIGH (ref 65–99)
Osmolality: 268 (ref 275–301)
Potassium: 3.4 mmol/L — ABNORMAL LOW (ref 3.5–5.1)
SGPT (ALT): 10 U/L — ABNORMAL LOW
Sodium: 134 mmol/L — ABNORMAL LOW (ref 136–145)
TOTAL PROTEIN: 6.5 g/dL (ref 6.4–8.2)

## 2014-01-20 LAB — CBC WITH DIFFERENTIAL/PLATELET
BASOS PCT: 0.9 %
Basophil #: 0.1 10*3/uL (ref 0.0–0.1)
EOS ABS: 0 10*3/uL (ref 0.0–0.7)
EOS PCT: 0.1 %
HCT: 32 % — AB (ref 35.0–47.0)
HGB: 10.1 g/dL — ABNORMAL LOW (ref 12.0–16.0)
LYMPHS PCT: 9.5 %
Lymphocyte #: 0.7 10*3/uL — ABNORMAL LOW (ref 1.0–3.6)
MCH: 28.9 pg (ref 26.0–34.0)
MCHC: 31.5 g/dL — ABNORMAL LOW (ref 32.0–36.0)
MCV: 92 fL (ref 80–100)
MONOS PCT: 4.7 %
Monocyte #: 0.4 x10 3/mm (ref 0.2–0.9)
Neutrophil #: 6.6 10*3/uL — ABNORMAL HIGH (ref 1.4–6.5)
Neutrophil %: 84.8 %
Platelet: 125 10*3/uL — ABNORMAL LOW (ref 150–440)
RBC: 3.49 10*6/uL — ABNORMAL LOW (ref 3.80–5.20)
RDW: 20.4 % — ABNORMAL HIGH (ref 11.5–14.5)
WBC: 7.7 10*3/uL (ref 3.6–11.0)

## 2014-01-20 LAB — URINALYSIS, COMPLETE
Bacteria: NONE SEEN
Bilirubin,UR: NEGATIVE
Blood: NEGATIVE
GLUCOSE, UR: NEGATIVE mg/dL (ref 0–75)
Ketone: NEGATIVE
Nitrite: NEGATIVE
PH: 8 (ref 4.5–8.0)
Protein: 30
Specific Gravity: 1.005 (ref 1.003–1.030)
WBC UR: 7 /HPF (ref 0–5)

## 2014-01-20 LAB — PRO B NATRIURETIC PEPTIDE: B-TYPE NATIURETIC PEPTID: 3900 pg/mL — AB (ref 0–125)

## 2014-01-20 LAB — TROPONIN I
Troponin-I: 0.06 ng/mL — ABNORMAL HIGH
Troponin-I: 0.07 ng/mL — ABNORMAL HIGH

## 2014-01-21 LAB — CBC WITH DIFFERENTIAL/PLATELET
Basophil #: 0 10*3/uL (ref 0.0–0.1)
Basophil %: 0.8 %
Eosinophil #: 0 10*3/uL (ref 0.0–0.7)
Eosinophil %: 0.3 %
HCT: 28.6 % — ABNORMAL LOW (ref 35.0–47.0)
HGB: 9.1 g/dL — ABNORMAL LOW (ref 12.0–16.0)
LYMPHS ABS: 0.7 10*3/uL — AB (ref 1.0–3.6)
Lymphocyte %: 15.9 %
MCH: 29.1 pg (ref 26.0–34.0)
MCHC: 32 g/dL (ref 32.0–36.0)
MCV: 91 fL (ref 80–100)
MONO ABS: 0.3 x10 3/mm (ref 0.2–0.9)
MONOS PCT: 6.4 %
NEUTROS PCT: 76.6 %
Neutrophil #: 3.4 10*3/uL (ref 1.4–6.5)
PLATELETS: 105 10*3/uL — AB (ref 150–440)
RBC: 3.14 10*6/uL — ABNORMAL LOW (ref 3.80–5.20)
RDW: 20.5 % — AB (ref 11.5–14.5)
WBC: 4.5 10*3/uL (ref 3.6–11.0)

## 2014-01-21 LAB — CK-MB
CK-MB: 0.6 ng/mL (ref 0.5–3.6)
CK-MB: 0.5 ng/mL (ref 0.5–3.6)
CK-MB: 0.6 ng/mL (ref 0.5–3.6)

## 2014-01-21 LAB — TROPONIN I: Troponin-I: 0.06 ng/mL — ABNORMAL HIGH

## 2014-01-22 LAB — BASIC METABOLIC PANEL
Anion Gap: 5 — ABNORMAL LOW (ref 7–16)
BUN: 12 mg/dL (ref 7–18)
CALCIUM: 8 mg/dL — AB (ref 8.5–10.1)
CHLORIDE: 99 mmol/L (ref 98–107)
CO2: 33 mmol/L — AB (ref 21–32)
GLUCOSE: 93 mg/dL (ref 65–99)
Osmolality: 273 (ref 275–301)
Potassium: 3.9 mmol/L (ref 3.5–5.1)
Sodium: 137 mmol/L (ref 136–145)

## 2014-01-22 LAB — CREATININE, SERUM
Creatinine: 0.95 mg/dL (ref 0.60–1.30)
EGFR (African American): >60

## 2014-01-23 ENCOUNTER — Ambulatory Visit: Payer: Self-pay | Admitting: Oncology

## 2014-01-23 ENCOUNTER — Ambulatory Visit: Payer: Self-pay | Admitting: Internal Medicine

## 2014-01-23 LAB — URINE CULTURE

## 2014-01-24 LAB — FERRITIN: Ferritin (ARMC): 82 ng/mL (ref 8–388)

## 2014-01-24 LAB — CANCER CENTER HEMOGLOBIN: HGB: 10.6 g/dL — ABNORMAL LOW (ref 12.0–16.0)

## 2014-01-25 LAB — CULTURE, BLOOD (SINGLE)

## 2014-02-07 LAB — CANCER CENTER HEMOGLOBIN: HGB: 10.7 g/dL — AB (ref 12.0–16.0)

## 2014-02-07 LAB — FERRITIN: Ferritin (ARMC): 25 ng/mL (ref 8–388)

## 2014-02-23 ENCOUNTER — Ambulatory Visit: Payer: Self-pay | Admitting: Internal Medicine

## 2014-02-28 LAB — FERRITIN: Ferritin (ARMC): 52 ng/mL (ref 8–388)

## 2014-02-28 LAB — CANCER CENTER HEMOGLOBIN: HGB: 12.1 g/dL (ref 12.0–16.0)

## 2014-03-05 ENCOUNTER — Encounter: Payer: Self-pay | Admitting: *Deleted

## 2014-03-15 ENCOUNTER — Emergency Department: Payer: Self-pay | Admitting: Emergency Medicine

## 2014-03-15 ENCOUNTER — Ambulatory Visit (INDEPENDENT_AMBULATORY_CARE_PROVIDER_SITE_OTHER): Payer: Medicare Other | Admitting: General Surgery

## 2014-03-15 ENCOUNTER — Encounter: Payer: Self-pay | Admitting: General Surgery

## 2014-03-15 VITALS — BP 132/62 | HR 82 | Resp 20 | Ht 64.0 in | Wt 222.0 lb

## 2014-03-15 DIAGNOSIS — K649 Unspecified hemorrhoids: Secondary | ICD-10-CM

## 2014-03-15 DIAGNOSIS — K7460 Unspecified cirrhosis of liver: Secondary | ICD-10-CM

## 2014-03-15 LAB — COMPREHENSIVE METABOLIC PANEL
ANION GAP: 9 (ref 7–16)
Albumin: 2.1 g/dL — ABNORMAL LOW (ref 3.4–5.0)
Alkaline Phosphatase: 221 U/L — ABNORMAL HIGH
BUN: 8 mg/dL (ref 7–18)
Bilirubin,Total: 0.4 mg/dL (ref 0.2–1.0)
CHLORIDE: 98 mmol/L (ref 98–107)
Calcium, Total: 7.7 mg/dL — ABNORMAL LOW (ref 8.5–10.1)
Co2: 29 mmol/L (ref 21–32)
Creatinine: 1.05 mg/dL (ref 0.60–1.30)
EGFR (Non-African Amer.): 57 — ABNORMAL LOW
Glucose: 57 mg/dL — ABNORMAL LOW (ref 65–99)
OSMOLALITY: 268 (ref 275–301)
POTASSIUM: 3.1 mmol/L — AB (ref 3.5–5.1)
SGOT(AST): 40 U/L — ABNORMAL HIGH (ref 15–37)
SGPT (ALT): 13 U/L — ABNORMAL LOW
Sodium: 136 mmol/L (ref 136–145)
TOTAL PROTEIN: 5.9 g/dL — AB (ref 6.4–8.2)

## 2014-03-15 LAB — CBC WITH DIFFERENTIAL/PLATELET
BASOS PCT: 0.5 %
Basophil #: 0 10*3/uL (ref 0.0–0.1)
Eosinophil #: 0 10*3/uL (ref 0.0–0.7)
Eosinophil %: 0.5 %
HCT: 37.3 % (ref 35.0–47.0)
HGB: 11.9 g/dL — ABNORMAL LOW (ref 12.0–16.0)
Lymphocyte #: 0.7 10*3/uL — ABNORMAL LOW (ref 1.0–3.6)
Lymphocyte %: 14.1 %
MCH: 30.3 pg (ref 26.0–34.0)
MCHC: 32 g/dL (ref 32.0–36.0)
MCV: 95 fL (ref 80–100)
MONO ABS: 0.2 x10 3/mm (ref 0.2–0.9)
Monocyte %: 4.7 %
NEUTROS ABS: 4 10*3/uL (ref 1.4–6.5)
Neutrophil %: 80.2 %
Platelet: 142 10*3/uL — ABNORMAL LOW (ref 150–440)
RBC: 3.93 10*6/uL (ref 3.80–5.20)
RDW: 21 % — ABNORMAL HIGH (ref 11.5–14.5)
WBC: 5 10*3/uL (ref 3.6–11.0)

## 2014-03-15 LAB — URINALYSIS, COMPLETE
Bilirubin,UR: NEGATIVE
Glucose,UR: NEGATIVE mg/dL (ref 0–75)
KETONE: NEGATIVE
Nitrite: NEGATIVE
Ph: 6 (ref 4.5–8.0)
Specific Gravity: 1.005 (ref 1.003–1.030)
Squamous Epithelial: 5
WBC UR: 9 /HPF (ref 0–5)

## 2014-03-15 LAB — TROPONIN I: Troponin-I: <0.02 ng/mL

## 2014-03-15 LAB — PRO B NATRIURETIC PEPTIDE: B-Type Natriuretic Peptide: 1311 pg/mL — ABNORMAL HIGH (ref 0–125)

## 2014-03-15 LAB — MAGNESIUM: MAGNESIUM: 1.6 mg/dL — AB

## 2014-03-15 LAB — LIPASE, BLOOD: Lipase: 74 U/L (ref 73–393)

## 2014-03-15 MED ORDER — HYDROCORTISONE ACE-PRAMOXINE 2.5-1 % EX CREA: 1.0000 "application " | TOPICAL_CREAM | Freq: Two times a day (BID) | CUTANEOUS | Status: DC

## 2014-03-15 NOTE — Progress Notes (Signed)
Patient ID: Samantha Mcmillan, female   DOB: 1955/05/08, 59 y.o.   MRN: [id number]  Chief Complaint  Patient presents with  . Rectal Bleeding    hemorrhoids    HPI Samantha Mcmillan is a 59 y.o. female.  Here today for evaluation of hemorrhoids. She states she has had hemorrhoids for many years. She noticed that around Christmas she started having some bleeding for about 4 days then it stopped. She did have bleeding at the first part of the month. She sees blood on the toilet paper. Seems to be worse with constipation. She uses Dulcolax occasionally for constipation. She occasionally has loose stools as well. She has a history of IBS in the past. She has tried preparation H and it helps some.   HPI  Past Medical History  Diagnosis Date  . GERD (gastroesophageal reflux disease)   . Bronchitis   . Anemia   . Colon polyp   . Cirrhosis of liver   . Diabetes mellitus without complication   . Hypertension   . Thyroid disease   . Depression   . COPD (chronic obstructive pulmonary disease)   . IBS (irritable bowel syndrome)     Past Surgical History  Procedure Laterality Date  . Cesarean section  1979  . Cataract extraction  2005  . Back surgery  2015    neck  . Colonoscopy  2014  . Upper gastrointestinal endoscopy      Family History  Problem Relation Age of Onset  . Cancer Father     pancreatic    Social History History  Substance Use Topics  . Smoking status: Current Every Day Smoker -- 0.50 packs/day for 49 years    Types: Cigarettes  . Smokeless tobacco: Never Used  . Alcohol Use: No    Allergies  Allergen Reactions  . Iodine Shortness Of Breath  . Latex Anaphylaxis  . Oxycodone Shortness Of Breath  . Percocet [Oxycodone-Acetaminophen] Shortness Of Breath  . Shellfish Allergy Anaphylaxis  . Amitriptyline Other (See Comments)    Mental changes  . Wellbutrin [Bupropion] Other (See Comments)    Mental changes  . Augmentin [Amoxicillin-Pot Clavulanate] Rash   . Codeine Rash  . Cortisone Rash  . Diphenhydramine Rash  . Other Rash    darvocet  . Vicodin [Hydrocodone-Acetaminophen] Rash    dizzy    Current Outpatient Prescriptions  Medication Sig Dispense Refill  . albuterol-ipratropium (COMBIVENT) 18-103 MCG/ACT inhaler Inhale 2 puffs into the lungs 2 (two) times daily.    Marland Kitchen allopurinol (ZYLOPRIM) 100 MG tablet Take 100 mg by mouth daily.    . ARIPiprazole (ABILIFY) 5 MG tablet Take 7.5 mg by mouth daily.    Marland Kitchen atorvastatin (LIPITOR) 10 MG tablet Take 10 mg by mouth daily.    . budesonide-formoterol (SYMBICORT) 80-4.5 MCG/ACT inhaler Inhale 2 puffs into the lungs 2 (two) times daily.    . cyclobenzaprine (FLEXERIL) 10 MG tablet Take 10 mg by mouth 3 (three) times daily.    . furosemide (LASIX) 40 MG tablet Take 40 mg by mouth 2 (two) times daily.    Marland Kitchen gabapentin (NEURONTIN) 300 MG capsule Take 300 mg by mouth at bedtime.    Marland Kitchen glipiZIDE (GLUCOTROL XL) 10 MG 24 hr tablet Take 10 mg by mouth 2 (two) times daily.    Marland Kitchen HYDROmorphone (DILAUDID) 2 MG tablet Take 2 mg by mouth every 6 (six) hours as needed for severe pain.    Marland Kitchen lamoTRIgine (LAMICTAL) 150 MG tablet Take 150 mg  by mouth daily.    Marland Kitchen levothyroxine (SYNTHROID, LEVOTHROID) 75 MCG tablet Take 75 mcg by mouth 2 (two) times daily.    Marland Kitchen lisinopril (PRINIVIL,ZESTRIL) 20 MG tablet Take 20 mg by mouth daily.    . metoprolol succinate (TOPROL-XL) 25 MG 24 hr tablet Take 25 mg by mouth 2 (two) times daily.    . montelukast (SINGULAIR) 10 MG tablet Take 10 mg by mouth at bedtime.    . pantoprazole (PROTONIX) 40 MG tablet Take 40 mg by mouth daily.    . potassium chloride (K-DUR,KLOR-CON) 10 MEQ tablet Take 10 mEq by mouth daily.    . saxagliptin HCl (ONGLYZA) 2.5 MG TABS tablet Take 2.5 mg by mouth 2 (two) times daily.    . sertraline (ZOLOFT) 100 MG tablet Take 100 mg by mouth daily.    . Pramoxine-HC (HYDROCORTISONE ACE-PRAMOXINE) 2.5-1 % CREA Apply 1 application topically 2 (two) times daily. 1  Tube 0   No current facility-administered medications for this visit.    Review of Systems Review of Systems  Constitutional: Negative.   Respiratory: Positive for cough and shortness of breath.   Cardiovascular: Negative.   Gastrointestinal: Positive for constipation and rectal pain.    Blood pressure 132/62, pulse 82, resp. rate 20, height 5\' 4"  (1.626 m), weight 222 lb (100.699 kg).  Physical Exam Physical Exam  Constitutional: She is oriented to person, place, and time. She appears well-developed and well-nourished.  Neck: Neck supple.  Cardiovascular: Normal rate, regular rhythm and normal heart sounds.   Pulmonary/Chest: Effort normal. She has wheezes.  Scattered wheezes bilaterally.  Abdominal: She exhibits distension.  Abdomen dull to percussion.  Genitourinary: Rectal exam shows external hemorrhoid and internal hemorrhoid.  Multiple internal and external hemorrhoids.  Lymphadenopathy:    She has no cervical adenopathy.  Neurological: She is alert and oriented to person, place, and time.  Skin: Skin is warm and dry.    Data Reviewed Office notes.  Assessment    Multiple internal and external hemorrhoids. Pt has cirrhosis and likely ascites suggesting portal hypertension. Not an ideal candidate for multiple hemorrhoidectomy. Based on her history she has had some bleeding off and on for years.     Plan    Check LFTs and Protime. Trial of Analpram cream 2.5 %. Recheck in 1 mo.        Cristiano Capri G 03/16/2014, 3:59 PM

## 2014-03-15 NOTE — Patient Instructions (Signed)
Patient to return in one month. 

## 2014-03-16 ENCOUNTER — Encounter: Payer: Self-pay | Admitting: General Surgery

## 2014-03-16 LAB — HEPATIC FUNCTION PANEL
ALBUMIN: 2.9 g/dL — AB (ref 3.6–4.8)
ALK PHOS: 212 IU/L — AB (ref 39–117)
ALT: 11 IU/L (ref 0–32)
AST: 32 IU/L (ref 0–40)
Bilirubin, Direct: 0.16 mg/dL (ref 0.00–0.40)
Total Bilirubin: 0.3 mg/dL (ref 0.0–1.2)
Total Protein: 5.7 g/dL — ABNORMAL LOW (ref 6.0–8.5)

## 2014-03-16 LAB — PT AND PTT
INR: 1 (ref 0.8–1.2)
PROTHROMBIN TIME: 10.8 s (ref 9.1–12.0)
aPTT: 31 s (ref 24–33)

## 2014-03-21 LAB — CANCER CENTER HEMOGLOBIN: HGB: 11.6 g/dL — ABNORMAL LOW (ref 12.0–16.0)

## 2014-03-21 LAB — FERRITIN: Ferritin (ARMC): 39 ng/mL (ref 8–388)

## 2014-03-26 ENCOUNTER — Ambulatory Visit: Payer: Self-pay | Admitting: Internal Medicine

## 2014-03-29 ENCOUNTER — Ambulatory Visit: Payer: Medicare Other | Admitting: General Surgery

## 2014-04-11 ENCOUNTER — Inpatient Hospital Stay: Payer: Self-pay | Admitting: Internal Medicine

## 2014-04-12 ENCOUNTER — Ambulatory Visit: Payer: Medicare Other | Admitting: General Surgery

## 2014-04-25 ENCOUNTER — Ambulatory Visit
Admit: 2014-04-25 | Disposition: A | Discharge status: Home / Self Care | Payer: Self-pay | Attending: Internal Medicine | Admitting: Internal Medicine

## 2014-05-04 ENCOUNTER — Inpatient Hospital Stay (HOSPITAL_COMMUNITY)
Admission: EM | Admit: 2014-05-04 | Discharge: 2014-05-09 | DRG: 194 | Disposition: A | Discharge status: Home Health | Payer: Medicare Other | Attending: Internal Medicine | Admitting: Internal Medicine

## 2014-05-04 ENCOUNTER — Emergency Department (HOSPITAL_COMMUNITY): Payer: Medicare Other

## 2014-05-04 ENCOUNTER — Encounter (HOSPITAL_COMMUNITY): Payer: Self-pay

## 2014-05-04 DIAGNOSIS — J189 Pneumonia, unspecified organism: Secondary | ICD-10-CM | POA: Diagnosis present

## 2014-05-04 DIAGNOSIS — R0902 Hypoxemia: Secondary | ICD-10-CM | POA: Diagnosis present

## 2014-05-04 DIAGNOSIS — E785 Hyperlipidemia, unspecified: Secondary | ICD-10-CM | POA: Diagnosis present

## 2014-05-04 DIAGNOSIS — I10 Essential (primary) hypertension: Secondary | ICD-10-CM | POA: Diagnosis present

## 2014-05-04 DIAGNOSIS — N179 Acute kidney failure, unspecified: Secondary | ICD-10-CM | POA: Diagnosis present

## 2014-05-04 DIAGNOSIS — D6959 Other secondary thrombocytopenia: Secondary | ICD-10-CM | POA: Diagnosis present

## 2014-05-04 DIAGNOSIS — M549 Dorsalgia, unspecified: Secondary | ICD-10-CM | POA: Diagnosis present

## 2014-05-04 DIAGNOSIS — Z981 Arthrodesis status: Secondary | ICD-10-CM | POA: Diagnosis not present

## 2014-05-04 DIAGNOSIS — Z888 Allergy status to other drugs, medicaments and biological substances status: Secondary | ICD-10-CM

## 2014-05-04 DIAGNOSIS — J449 Chronic obstructive pulmonary disease, unspecified: Secondary | ICD-10-CM | POA: Diagnosis present

## 2014-05-04 DIAGNOSIS — K760 Fatty (change of) liver, not elsewhere classified: Secondary | ICD-10-CM | POA: Diagnosis present

## 2014-05-04 DIAGNOSIS — E871 Hypo-osmolality and hyponatremia: Secondary | ICD-10-CM | POA: Diagnosis present

## 2014-05-04 DIAGNOSIS — I959 Hypotension, unspecified: Secondary | ICD-10-CM | POA: Diagnosis present

## 2014-05-04 DIAGNOSIS — D649 Anemia, unspecified: Secondary | ICD-10-CM | POA: Diagnosis present

## 2014-05-04 DIAGNOSIS — Z23 Encounter for immunization: Secondary | ICD-10-CM

## 2014-05-04 DIAGNOSIS — Z91013 Allergy to seafood: Secondary | ICD-10-CM | POA: Diagnosis not present

## 2014-05-04 DIAGNOSIS — Z7951 Long term (current) use of inhaled steroids: Secondary | ICD-10-CM | POA: Diagnosis not present

## 2014-05-04 DIAGNOSIS — R339 Retention of urine, unspecified: Secondary | ICD-10-CM | POA: Diagnosis present

## 2014-05-04 DIAGNOSIS — IMO0001 Reserved for inherently not codable concepts without codable children: Secondary | ICD-10-CM

## 2014-05-04 DIAGNOSIS — N390 Urinary tract infection, site not specified: Secondary | ICD-10-CM | POA: Diagnosis present

## 2014-05-04 DIAGNOSIS — Y95 Nosocomial condition: Secondary | ICD-10-CM | POA: Diagnosis present

## 2014-05-04 DIAGNOSIS — Z79891 Long term (current) use of opiate analgesic: Secondary | ICD-10-CM

## 2014-05-04 DIAGNOSIS — B9620 Unspecified Escherichia coli [E. coli] as the cause of diseases classified elsewhere: Secondary | ICD-10-CM | POA: Diagnosis present

## 2014-05-04 DIAGNOSIS — R112 Nausea with vomiting, unspecified: Secondary | ICD-10-CM | POA: Diagnosis not present

## 2014-05-04 DIAGNOSIS — R4182 Altered mental status, unspecified: Secondary | ICD-10-CM | POA: Diagnosis present

## 2014-05-04 DIAGNOSIS — G8929 Other chronic pain: Secondary | ICD-10-CM | POA: Diagnosis present

## 2014-05-04 DIAGNOSIS — Z79899 Other long term (current) drug therapy: Secondary | ICD-10-CM | POA: Diagnosis not present

## 2014-05-04 DIAGNOSIS — F17210 Nicotine dependence, cigarettes, uncomplicated: Secondary | ICD-10-CM | POA: Diagnosis present

## 2014-05-04 DIAGNOSIS — Z885 Allergy status to narcotic agent status: Secondary | ICD-10-CM

## 2014-05-04 DIAGNOSIS — E119 Type 2 diabetes mellitus without complications: Secondary | ICD-10-CM

## 2014-05-04 DIAGNOSIS — Z880 Allergy status to penicillin: Secondary | ICD-10-CM | POA: Diagnosis not present

## 2014-05-04 DIAGNOSIS — Z91040 Latex allergy status: Secondary | ICD-10-CM

## 2014-05-04 DIAGNOSIS — Z9981 Dependence on supplemental oxygen: Secondary | ICD-10-CM

## 2014-05-04 DIAGNOSIS — K7460 Unspecified cirrhosis of liver: Secondary | ICD-10-CM | POA: Diagnosis present

## 2014-05-04 DIAGNOSIS — E11649 Type 2 diabetes mellitus with hypoglycemia without coma: Secondary | ICD-10-CM | POA: Diagnosis present

## 2014-05-04 DIAGNOSIS — A419 Sepsis, unspecified organism: Secondary | ICD-10-CM | POA: Insufficient documentation

## 2014-05-04 DIAGNOSIS — E039 Hypothyroidism, unspecified: Secondary | ICD-10-CM | POA: Diagnosis present

## 2014-05-04 HISTORY — DX: Low back pain: M54.5

## 2014-05-04 HISTORY — DX: Low back pain, unspecified: M54.50

## 2014-05-04 HISTORY — DX: Anxiety disorder, unspecified: F41.9

## 2014-05-04 HISTORY — DX: Unspecified asthma, uncomplicated: J45.909

## 2014-05-04 HISTORY — DX: Personal history of other medical treatment: Z92.89

## 2014-05-04 HISTORY — DX: Unspecified chronic bronchitis: J42

## 2014-05-04 HISTORY — DX: Pure hypercholesterolemia, unspecified: E78.00

## 2014-05-04 HISTORY — DX: Sleep apnea, unspecified: G47.30

## 2014-05-04 HISTORY — DX: Dependence on supplemental oxygen: Z99.81

## 2014-05-04 HISTORY — DX: Headache, unspecified: R51.9

## 2014-05-04 HISTORY — DX: Family history of other specified conditions: Z84.89

## 2014-05-04 HISTORY — DX: Iron deficiency anemia, unspecified: D50.9

## 2014-05-04 HISTORY — DX: Cardiac murmur, unspecified: R01.1

## 2014-05-04 HISTORY — DX: Other chronic pain: G89.29

## 2014-05-04 HISTORY — DX: Unspecified osteoarthritis, unspecified site: M19.90

## 2014-05-04 HISTORY — DX: Headache: R51

## 2014-05-04 HISTORY — DX: Pneumonia, unspecified organism: J18.9

## 2014-05-04 HISTORY — DX: Type 2 diabetes mellitus without complications: E11.9

## 2014-05-04 LAB — CBC WITH DIFFERENTIAL/PLATELET
Basophils Absolute: 0 10*3/uL (ref 0.0–0.1)
Basophils Relative: 0 % (ref 0–1)
Eosinophils Absolute: 0 10*3/uL (ref 0.0–0.7)
Eosinophils Relative: 0 % (ref 0–5)
HCT: 30.7 % — ABNORMAL LOW (ref 36.0–46.0)
HEMOGLOBIN: 10.1 g/dL — AB (ref 12.0–15.0)
LYMPHS ABS: 1.7 10*3/uL (ref 0.7–4.0)
LYMPHS PCT: 30 % (ref 12–46)
MCH: 32.1 pg (ref 26.0–34.0)
MCHC: 32.9 g/dL (ref 30.0–36.0)
MCV: 97.5 fL (ref 78.0–100.0)
MONO ABS: 0.3 10*3/uL (ref 0.1–1.0)
Monocytes Relative: 5 % (ref 3–12)
NEUTROS ABS: 3.7 10*3/uL (ref 1.7–7.7)
Neutrophils Relative %: 65 % (ref 43–77)
Platelets: 110 10*3/uL — ABNORMAL LOW (ref 150–400)
RBC: 3.15 MIL/uL — ABNORMAL LOW (ref 3.87–5.11)
RDW: 14.9 % (ref 11.5–15.5)
WBC: 5.6 10*3/uL (ref 4.0–10.5)

## 2014-05-04 LAB — BASIC METABOLIC PANEL
ANION GAP: 8 (ref 5–15)
BUN: 24 mg/dL — ABNORMAL HIGH (ref 6–23)
CALCIUM: 8.2 mg/dL — AB (ref 8.4–10.5)
CO2: 33 mmol/L — ABNORMAL HIGH (ref 19–32)
Chloride: 91 mmol/L — ABNORMAL LOW (ref 96–112)
Creatinine, Ser: 1.57 mg/dL — ABNORMAL HIGH (ref 0.50–1.10)
GFR calc Af Amer: 40 mL/min — ABNORMAL LOW (ref >90)
GFR calc non Af Amer: 35 mL/min — ABNORMAL LOW (ref >90)
Glucose, Bld: 160 mg/dL — ABNORMAL HIGH (ref 70–99)
POTASSIUM: 4.8 mmol/L (ref 3.5–5.1)
Sodium: 132 mmol/L — ABNORMAL LOW (ref 135–145)

## 2014-05-04 LAB — URINALYSIS, ROUTINE W REFLEX MICROSCOPIC
BILIRUBIN URINE: NEGATIVE
Glucose, UA: NEGATIVE mg/dL
HGB URINE DIPSTICK: NEGATIVE
KETONES UR: NEGATIVE mg/dL
Nitrite: POSITIVE — AB
Protein, ur: NEGATIVE mg/dL
SPECIFIC GRAVITY, URINE: 1.012 (ref 1.005–1.030)
Urobilinogen, UA: 0.2 mg/dL (ref 0.0–1.0)
pH: 7 (ref 5.0–8.0)

## 2014-05-04 LAB — URINE MICROSCOPIC-ADD ON

## 2014-05-04 LAB — HEPATIC FUNCTION PANEL
ALT: 17 U/L (ref 0–35)
AST: 38 U/L — AB (ref 0–37)
Albumin: 2.2 g/dL — ABNORMAL LOW (ref 3.5–5.2)
Alkaline Phosphatase: 198 U/L — ABNORMAL HIGH (ref 39–117)
Bilirubin, Direct: 0.2 mg/dL (ref 0.0–0.5)
Indirect Bilirubin: 0.3 mg/dL (ref 0.3–0.9)
Total Bilirubin: 0.5 mg/dL (ref 0.3–1.2)
Total Protein: 5.3 g/dL — ABNORMAL LOW (ref 6.0–8.3)

## 2014-05-04 LAB — I-STAT TROPONIN, ED: TROPONIN I, POC: 0.01 ng/mL (ref 0.00–0.08)

## 2014-05-04 LAB — GLUCOSE, CAPILLARY
Glucose-Capillary: 124 mg/dL — ABNORMAL HIGH (ref 70–99)
Glucose-Capillary: 223 mg/dL — ABNORMAL HIGH (ref 70–99)

## 2014-05-04 LAB — I-STAT CG4 LACTIC ACID, ED
Lactic Acid, Venous: 1.64 mmol/L (ref 0.5–2.0)
Lactic Acid, Venous: 1.88 mmol/L (ref 0.5–2.0)

## 2014-05-04 LAB — I-STAT ARTERIAL BLOOD GAS, ED
ACID-BASE EXCESS: 8 mmol/L — AB (ref 0.0–2.0)
Bicarbonate: 34.5 mEq/L — ABNORMAL HIGH (ref 20.0–24.0)
O2 SAT: 98 %
PH ART: 7.41 (ref 7.350–7.450)
PO2 ART: 104 mmHg — AB (ref 80.0–100.0)
TCO2: 36 mmol/L (ref 0–100)
pCO2 arterial: 54.5 mmHg — ABNORMAL HIGH (ref 35.0–45.0)

## 2014-05-04 LAB — AMMONIA: Ammonia: 20 umol/L (ref 11–32)

## 2014-05-04 LAB — BRAIN NATRIURETIC PEPTIDE: B NATRIURETIC PEPTIDE 5: 210.9 pg/mL — AB (ref 0.0–100.0)

## 2014-05-04 LAB — PROCALCITONIN: PROCALCITONIN: 0.18 ng/mL

## 2014-05-04 MED ORDER — NALOXONE HCL 0.4 MG/ML IJ SOLN
0.4000 mg | Freq: Once | INTRAMUSCULAR | Status: DC
Start: 2014-05-04 — End: 2014-05-04

## 2014-05-04 MED ORDER — NALOXONE HCL 1 MG/ML IJ SOLN
INTRAMUSCULAR | Status: AC
Start: 2014-05-04 — End: 2014-05-05
  Filled 2014-05-04: qty 2

## 2014-05-04 MED ORDER — INSULIN ASPART 100 UNIT/ML ~~LOC~~ SOLN
0.0000 [IU] | Freq: Three times a day (TID) | SUBCUTANEOUS | Status: DC
  Administered 2014-05-04: 1 [IU] via SUBCUTANEOUS
  Administered 2014-05-05: 2 [IU] via SUBCUTANEOUS
  Administered 2014-05-05: 3 [IU] via SUBCUTANEOUS
  Administered 2014-05-06 (×2): 2 [IU] via SUBCUTANEOUS

## 2014-05-04 MED ORDER — SODIUM CHLORIDE 0.9 % IV SOLN
INTRAVENOUS | Status: DC
  Administered 2014-05-04: 19:00:00 via INTRAVENOUS

## 2014-05-04 MED ORDER — INSULIN ASPART 100 UNIT/ML ~~LOC~~ SOLN
0.0000 [IU] | Freq: Every day | SUBCUTANEOUS | Status: DC
  Administered 2014-05-05: 2 [IU] via SUBCUTANEOUS

## 2014-05-04 MED ORDER — BUDESONIDE-FORMOTEROL FUMARATE 80-4.5 MCG/ACT IN AERO
2.0000 | INHALATION_SPRAY | Freq: Two times a day (BID) | RESPIRATORY_TRACT | Status: DC
  Administered 2014-05-04 – 2014-05-09 (×8): 2 via RESPIRATORY_TRACT
  Filled 2014-05-04: qty 6.9

## 2014-05-04 MED ORDER — ATORVASTATIN CALCIUM 10 MG PO TABS
10.0000 mg | ORAL_TABLET | Freq: Every day | ORAL | Status: DC
  Administered 2014-05-04 – 2014-05-06 (×3): 10 mg via ORAL
  Filled 2014-05-04 (×4): qty 1

## 2014-05-04 MED ORDER — VANCOMYCIN HCL IN DEXTROSE 750-5 MG/150ML-% IV SOLN
750.0000 mg | Freq: Two times a day (BID) | INTRAVENOUS | Status: DC
  Administered 2014-05-05 – 2014-05-06 (×3): 750 mg via INTRAVENOUS
  Filled 2014-05-04 (×4): qty 150

## 2014-05-04 MED ORDER — VANCOMYCIN HCL IN DEXTROSE 1-5 GM/200ML-% IV SOLN
1000.0000 mg | Freq: Once | INTRAVENOUS | Status: AC
Start: 2014-05-04 — End: 2014-05-04
  Administered 2014-05-04: 1000 mg via INTRAVENOUS
  Filled 2014-05-04: qty 200

## 2014-05-04 MED ORDER — SODIUM CHLORIDE 0.9 % IV SOLN
250.0000 mL | INTRAVENOUS | Status: DC | PRN
Start: 2014-05-04 — End: 2014-05-09

## 2014-05-04 MED ORDER — LEVOTHYROXINE SODIUM 75 MCG PO TABS
75.0000 ug | ORAL_TABLET | Freq: Every day | ORAL | Status: DC
  Administered 2014-05-05 – 2014-05-09 (×5): 75 ug via ORAL
  Filled 2014-05-04 (×7): qty 1

## 2014-05-04 MED ORDER — SODIUM CHLORIDE 0.9 % IV BOLUS (SEPSIS)
1000.0000 mL | Freq: Once | INTRAVENOUS | Status: AC
  Administered 2014-05-04: 1000 mL via INTRAVENOUS

## 2014-05-04 MED ORDER — BUPRENORPHINE 10 MCG/HR TD PTWK: 10.0000 ug | MEDICATED_PATCH | TRANSDERMAL | Status: DC

## 2014-05-04 MED ORDER — SERTRALINE HCL 100 MG PO TABS
100.0000 mg | ORAL_TABLET | Freq: Every day | ORAL | Status: DC
  Administered 2014-05-04 – 2014-05-09 (×6): 100 mg via ORAL
  Filled 2014-05-04 (×6): qty 1

## 2014-05-04 MED ORDER — ARIPIPRAZOLE 5 MG PO TABS
5.0000 mg | ORAL_TABLET | Freq: Every day | ORAL | Status: DC
  Administered 2014-05-04 – 2014-05-09 (×6): 5 mg via ORAL
  Filled 2014-05-04 (×6): qty 1

## 2014-05-04 MED ORDER — ENOXAPARIN SODIUM 30 MG/0.3ML ~~LOC~~ SOLN
30.0000 mg | SUBCUTANEOUS | Status: DC
  Administered 2014-05-04: 30 mg via SUBCUTANEOUS
  Filled 2014-05-04 (×2): qty 0.3

## 2014-05-04 MED ORDER — NOREPINEPHRINE BITARTRATE 1 MG/ML IV SOLN
0.0000 ug/min | Freq: Once | INTRAVENOUS | Status: AC
  Administered 2014-05-04: 10 ug/min via INTRAVENOUS
  Filled 2014-05-04: qty 4

## 2014-05-04 MED ORDER — IPRATROPIUM-ALBUTEROL 0.5-2.5 (3) MG/3ML IN SOLN
3.0000 mL | Freq: Two times a day (BID) | RESPIRATORY_TRACT | Status: DC
  Administered 2014-05-04 – 2014-05-09 (×9): 3 mL via RESPIRATORY_TRACT
  Filled 2014-05-04 (×10): qty 3

## 2014-05-04 MED ORDER — LAMOTRIGINE 150 MG PO TABS
150.0000 mg | ORAL_TABLET | Freq: Every day | ORAL | Status: DC
  Administered 2014-05-05 – 2014-05-08 (×5): 150 mg via ORAL
  Filled 2014-05-04 (×7): qty 1

## 2014-05-04 MED ORDER — DEXTROSE 5 % IV SOLN
2.0000 g | INTRAVENOUS | Status: DC
  Administered 2014-05-05: 2 g via INTRAVENOUS
  Filled 2014-05-04 (×3): qty 2

## 2014-05-04 MED ORDER — NALOXONE HCL 1 MG/ML IJ SOLN
2.0000 mg | Freq: Once | INTRAMUSCULAR | Status: AC
  Administered 2014-05-04: 2 mg via INTRAVENOUS

## 2014-05-04 MED ORDER — SODIUM CHLORIDE 0.9 % IV BOLUS (SEPSIS)
250.0000 mL | Freq: Once | INTRAVENOUS | Status: AC
  Administered 2014-05-04: 250 mL via INTRAVENOUS

## 2014-05-04 MED ORDER — DEXTROSE 5 % IV SOLN
2.0000 g | Freq: Once | INTRAVENOUS | Status: AC
  Administered 2014-05-04: 2 g via INTRAVENOUS
  Filled 2014-05-04: qty 2

## 2014-05-04 MED ORDER — PANTOPRAZOLE SODIUM 40 MG PO TBEC
40.0000 mg | DELAYED_RELEASE_TABLET | Freq: Every day | ORAL | Status: DC
  Administered 2014-05-04 – 2014-05-09 (×6): 40 mg via ORAL
  Filled 2014-05-04 (×5): qty 1

## 2014-05-04 NOTE — ED Notes (Signed)
Pt has had no change after receiving the narcan

## 2014-05-04 NOTE — Progress Notes (Signed)
ANTIBIOTIC CONSULT NOTE - INITIAL  Pharmacy Consult for cefepime and vancomycin Indication: rule out sepsis  Allergies  Allergen Reactions  . Iodine Shortness Of Breath  . Latex Anaphylaxis  . Oxycodone Shortness Of Breath  . Percocet [Oxycodone-Acetaminophen] Shortness Of Breath  . Shellfish Allergy Anaphylaxis  . Amitriptyline Other (See Comments)    Mental changes  . Wellbutrin [Bupropion] Other (See Comments)    Mental changes  . Augmentin [Amoxicillin-Pot Clavulanate] Rash  . Codeine Rash  . Cortisone Rash  . Diphenhydramine Rash  . Other Rash    darvocet  . Vicodin [Hydrocodone-Acetaminophen] Rash    dizzy    Patient Measurements: Height: 5\' 3"  (160 cm) Weight: 185 lb 12.8 oz (84.278 kg) IBW/kg (Calculated) : 52.4   Vital Signs: Temp: 99.5 F (37.5 C) (03/11 1122) Temp Source: Rectal (03/11 1122) BP: 161/68 mmHg (03/11 1300) Pulse Rate: 75 (03/11 1300) Intake/Output from previous day:   Intake/Output from this shift: Total I/O In: 250 [I.V.:250] Out: -   Labs:  Recent Labs  05/04/14 1140  WBC 5.6  HGB 10.1*  PLT PENDING  CREATININE 1.57*   Estimated Creatinine Clearance: 39.2 mL/min (by C-G formula based on Cr of 1.57). No results for input(s): VANCOTROUGH, VANCOPEAK, VANCORANDOM, GENTTROUGH, GENTPEAK, GENTRANDOM, TOBRATROUGH, TOBRAPEAK, TOBRARND, AMIKACINPEAK, AMIKACINTROU, AMIKACIN in the last 72 hours.   Microbiology: No results found for this or any previous visit (from the past 720 hour(s)).  Medical History: Past Medical History  Diagnosis Date  . GERD (gastroesophageal reflux disease)   . Bronchitis   . Anemia   . Colon polyp   . Cirrhosis of liver   . Diabetes mellitus without complication   . Hypertension   . Thyroid disease   . Depression   . COPD (chronic obstructive pulmonary disease)   . IBS (irritable bowel syndrome)   . On home oxygen therapy      Assessment: 59 yo F in ED, brought from home by [facility] for hypotension.   On levophed drip and 4 L O2.   Pharmacy consulted to dose cefepime and vancomycin for sepsis.   Wt 84.3, creat 1.57 (was 0.75 on 2/16) WBC 5.6. Temp 99.5. LA 1.64;  Cefepime 3/11>> vanc 3/11>>  3/11 BCx2>> 3/11 Ucx>>   Goal of Therapy:  Vancomycin trough level 15-20 mcg/ml  Plan:  -cefepime 2 gm IV q24 -vancomycin 1 gm IV x 1 done, followed by vancomycin 750 mg IV q12h -f/u renal fxn, wbc, temp, culture data -steady-state vancomycin level as needed  Eudelia Bunch, Pharm.D. [phone] 05/04/2014 1:24 PM

## 2014-05-04 NOTE — ED Notes (Signed)
Per [facility], pt from home for hypotension.  BP was 100/58 by fire dept. EMS arrival it was 84/50. Pt has rales throughout and started on an Epi gtt for BP. BP spiked to 180/90 and gtt stopped. Upon arrival to the ED pressure dropped back to 84/30. 24g to LW. Oral temp was 99.4 after drinking something at home. Pt is on home O2 at 2 liters with 50 ft of tubing in the house per EMS. She was placed on 4 liters and sats came up to 97 percent.

## 2014-05-04 NOTE — ED Notes (Signed)
Dr. Tawnya Crook back in to speak with family member and pt.

## 2014-05-04 NOTE — ED Notes (Addendum)
Pt had a pain patch on her right forearm. Pain patch was removed from pt's right arm and left small skin tear. Dr. Tawnya Crook made aware. Pt very sleepy at this time.

## 2014-05-04 NOTE — H&P (Signed)
PULMONARY / CRITICAL CARE MEDICINE   Name: Samantha Mcmillan MRN: [id number] DOB: [birth year]    ADMISSION DATE:  05/04/2014 CONSULTATION DATE:  05/04/14  REFERRING MD :  EDP Tawnya Crook)  CHIEF COMPLAINT:  Abdominal pain, lethargy  INITIAL PRESENTATION: 59 y/o F w/ PMHx of DM type II, HTN, COPD, chronic back pain, and cirrhosis 2/2 NAFLD, admitted for hypotension and acute encephalopathy.  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 Per patient and significant other, patient had hard time getting out of bed this morning, had hard time sitting up, holding objects, pronunciation, etc. Per chart review, EMS was called, found patient to have low BP, started on Epi gtt in the field which was stopped prior to ED arrival. Patient states she has not been feeling that well recently, has chronic cough and subjective fevers at home. Denies SOB, chest pain, or dysuria. Does endorse some mild abdominal pain.   PAST MEDICAL HISTORY :   has a past medical history of GERD (gastroesophageal reflux disease); Bronchitis; Anemia; Colon polyp; Cirrhosis of liver; Diabetes mellitus without complication; Hypertension; Thyroid disease; Depression; COPD (chronic obstructive pulmonary disease); IBS (irritable bowel syndrome); and On home oxygen therapy.  has past surgical history that includes Cesarean section (1979); Cataract extraction (2005); Back surgery (2015); Colonoscopy (2014); and Upper gastrointestinal endoscopy.   Prior to Admission medications   Medication Sig Start Date End Date Taking? Authorizing Provider  albuterol-ipratropium (COMBIVENT) 18-103 MCG/ACT  inhaler Inhale 2 puffs into the lungs 2 (two) times daily.   Yes Historical Provider, MD  allopurinol (ZYLOPRIM) 100 MG tablet Take 100 mg by mouth 2 (two) times daily.    Yes Historical Provider, MD  ARIPiprazole (ABILIFY) 5 MG tablet Take 5 mg by mouth daily.    Yes Historical Provider, MD  atorvastatin (LIPITOR) 10 MG tablet Take 10 mg by mouth daily.   Yes Historical Provider, MD  budesonide-formoterol (SYMBICORT) 80-4.5 MCG/ACT inhaler Inhale 2 puffs into the lungs 2 (two) times daily as needed (shortness of breath).    Yes Historical Provider, MD  buprenorphine (BUTRANS - DOSED MCG/HR) 10 MCG/HR PTWK patch Place 10 mcg onto the skin once a week.   Yes Historical Provider, MD  clonazePAM (KLONOPIN) 0.5 MG tablet Take 0.5 mg by mouth daily.   Yes Historical Provider, MD  cyclobenzaprine (FLEXERIL) 10 MG tablet Take 10 mg by mouth 4 (four) times daily.    Yes Historical Provider, MD  gabapentin (NEURONTIN) 300 MG capsule Take 300 mg by mouth at bedtime.   Yes Historical Provider, MD  glipiZIDE (GLUCOTROL XL) 10 MG 24 hr tablet Take 10 mg by mouth 2 (two) times daily.   Yes Historical Provider, MD  HYDROmorphone (DILAUDID) 2 MG tablet Take 2 mg by mouth every 6 (six) hours as needed for severe pain.   Yes Historical Provider, MD  lamoTRIgine (LAMICTAL) 150 MG tablet Take 150 mg by mouth at bedtime.    Yes Historical Provider, MD  levothyroxine (SYNTHROID, LEVOTHROID) 75 MCG tablet Take 75 mcg by mouth daily.    Yes Historical Provider, MD  lisinopril (PRINIVIL,ZESTRIL) 20 MG tablet Take 10 mg by mouth 2 (two)  times daily.    Yes Historical Provider, MD  magnesium oxide (MAG-OX) 400 MG tablet Take 400 mg by mouth 2 (two) times daily.   Yes Historical Provider, MD  metoprolol tartrate (LOPRESSOR) 25 MG tablet Take 25 mg by mouth 2 (two) times daily.   Yes Historical Provider, MD  montelukast (SINGULAIR) 10 MG tablet Take 10 mg by mouth at bedtime.   Yes Historical Provider, MD  pantoprazole  (PROTONIX) 40 MG tablet Take 40 mg by mouth daily.   Yes Historical Provider, MD  potassium chloride (K-DUR) 10 MEQ tablet Take 10 mEq by mouth daily.   Yes Historical Provider, MD  Saxagliptin-Metformin 2.06-998 MG TB24 Take 1 tablet by mouth 2 (two) times daily.   Yes Historical Provider, MD  sertraline (ZOLOFT) 100 MG tablet Take 100 mg by mouth daily.   Yes Historical Provider, MD  spironolactone (ALDACTONE) 50 MG tablet Take 50 mg by mouth daily. 03/31/14 03/31/15 Yes Historical Provider, MD  Vitamin D, Ergocalciferol, (DRISDOL) 50000 UNITS CAPS capsule Take 50,000 Units by mouth every 7 (seven) days.   Yes Historical Provider, MD  Pramoxine-HC (HYDROCORTISONE ACE-PRAMOXINE) 2.5-1 % CREA Apply 1 application topically 2 (two) times daily. 03/15/14   Seeplaputhur Robinette Haines, MD   Allergies  Allergen Reactions  . Iodine Shortness Of Breath  . Latex Anaphylaxis  . Oxycodone Shortness Of Breath  . Percocet [Oxycodone-Acetaminophen] Shortness Of Breath  . Shellfish Allergy Anaphylaxis  . Amitriptyline Other (See Comments)    Mental changes  . Wellbutrin [Bupropion] Other (See Comments)    Mental changes  . Augmentin [Amoxicillin-Pot Clavulanate] Rash  . Codeine Rash  . Cortisone Rash  . Diphenhydramine Rash  . Other Rash    darvocet  . Vicodin [Hydrocodone-Acetaminophen] Rash    dizzy    FAMILY HISTORY:  indicated that her mother is deceased. She indicated that her father is deceased.  SOCIAL HISTORY:  reports that she has been smoking Cigarettes.  She has a 24.5 pack-year smoking history. She has never used smokeless tobacco. She reports that she does not drink alcohol or use illicit drugs.  Review of Systems  General: Positive for subjective fever and fatigue. Denies diaphoresis, appetite change.  Respiratory: Positive for cough. Denies SOB and wheezing.   Cardiovascular: Denies chest pain and palpitations.  Gastrointestinal: Denies nausea, vomiting, abdominal pain, and  diarrhea Musculoskeletal: Positive for back pain. Denies myalgias, arthralgias, and gait problem.  Neurological: Positive for weakness and lightheadedness. Denies dizziness, syncope, and headaches.  Psychiatric/Behavioral: Denies mood changes, sleep disturbance, and agitation.   SUBJECTIVE:   VITAL SIGNS: Temp:  [99.5 F (37.5 C)] 99.5 F (37.5 C) (03/11 1122) Pulse Rate:  [65-87] 72 (03/11 1412) Resp:  [6-27] 11 (03/11 1400) BP: (68-168)/(34-70) 81/41 mmHg (03/11 1412) SpO2:  [95 %-100 %] 98 % (03/11 1412) Weight:  [185 lb 12.8 oz (84.278 kg)] 185 lb 12.8 oz (84.278 kg) (03/11 1122) HEMODYNAMICS:   VENTILATOR SETTINGS:   INTAKE / OUTPUT:  Intake/Output Summary (Last 24 hours) at 05/04/14 1507 Last data filed at 05/04/14 1408  Gross per 24 hour  Intake   1300 ml  Output     14 ml  Net   1286 ml    PHYSICAL EXAMINATION: General: Elderly white female, alert, cooperative, NAD. HEENT: PERRL, EOMI. Moist mucus membranes Neck: Full range of motion without pain, supple, no lymphadenopathy or carotid bruits Lungs: Air entry equal bilaterally. Coarse breath sounds throughout. No wheezes or rales.  Heart: RRR, no murmurs, gallops, or rubs  Abdomen: Soft, obese, mild tenderness, non-distended, BS + Extremities: Erythema over calves, may reflect venous stasis. Trace pitting edema. Pulses intact.  Neurologic: Alert & oriented x3, cranial nerves II-XII intact, strength grossly intact, sensation intact to light touch   LABS:  CBC  Recent Labs Lab 05/04/14 1140  WBC 5.6  HGB 10.1*  HCT 30.7*  PLT 110*   BMET  Recent Labs Lab 05/04/14 1140  NA 132*  K 4.8  CL 91*  CO2 33*  BUN 24*  CREATININE 1.57*  GLUCOSE 160*   Electrolytes  Recent Labs Lab 05/04/14 1140  CALCIUM 8.2*   Sepsis Markers  Recent Labs Lab 05/04/14 1202  LATICACIDVEN 1.64   ABG  Recent Labs Lab 05/04/14 1204  PHART 7.410  PCO2ART 54.5*  PO2ART 104.0*     ASSESSMENT /  PLAN:  PULMONARY A: Bilateral infiltrates; rule out flu.  ?HCAP; unlikely. Lactic acid 1.88 COPD on Home O2 P:   Supplemental O2 Flu screen Continue HCAP coverage for now; likely deescalate tomorrow Symbicort, combivent  CARDIOVASCULAR A:  Hypotension; improved w/ fluids HTN HLD ?CHF P:  Hold HTN medications, hold diuretics IVF: NS @ 50 cc/hr Lipitor  RENAL A:   AKI (baseline ~0.75); likely pre-renal in setting of HoTN Chronic Hyponatremia; 2/2 cirrhosis P:   IVF as above Hold diuretic Repeat BMP in AM  GASTROINTESTINAL A:   GERD Cirrhosis 2/2 NAFLD P:   Protonix LFT's pending  HEMATOLOGIC A:   Chronic Normocytic Anemia; likely related to liver disease Thrombocytopenia; liver disease DVT ppx P:  Repeat CBC in AM Lovenox [place]  INFECTIOUS A:   ?HCAP; unlikely as above. No sputum production ?Flu UTI SBP unlikely given absence of abdominal tenderness Afebrile, no leukocytosis P:   BCx2 3/11 >> UC 3/11 >> Abx: Cefepime, start date 3/11 >> Abx: Vancomycin, start date 3/11 >> Flu panel pending Low threshold to deescalate ABx   ENDOCRINE A:   DM type II   Hypothyroidism P:   ISS Continue Synthroid  NEUROLOGIC A:   Mild Acute Encephalopathy; resolving Depression, PTSD Chronic Back Pain P:   Hold home Dilaudid, Clonopin, Neurontin Continue Zoloft, Abilify, Lamictal Continue Buprenorphine patch    FAMILY  - Updates: Updated family at bedside 3/11   Natasha Bence, MD PGY-2, Internal Medicine Pager: [phone]

## 2014-05-04 NOTE — ED Notes (Signed)
Dr. Docherty at the bedside.  

## 2014-05-04 NOTE — Progress Notes (Signed)
Admission note:  Arrival Method: Stretcher from ED Mental Orientation:A&O X4 Telemetry: [phone] CCMD notified Assessment: See doc flowsheet Skin: dry;intact IV: L wrist & R wrist Pain: Denies Tubes: N/A Safety Measures: bed in lowest position, call light with in reach Admission Screening: To be completed 6700 Orientation: Patient has been oriented to the unit, staff and to the room.

## 2014-05-04 NOTE — ED Notes (Signed)
Admitting MD speaking with ex-husband.

## 2014-05-04 NOTE — ED Notes (Signed)
RT and family at the bedside.

## 2014-05-05 DIAGNOSIS — N3000 Acute cystitis without hematuria: Secondary | ICD-10-CM

## 2014-05-05 DIAGNOSIS — N390 Urinary tract infection, site not specified: Secondary | ICD-10-CM | POA: Diagnosis present

## 2014-05-05 DIAGNOSIS — J441 Chronic obstructive pulmonary disease with (acute) exacerbation: Secondary | ICD-10-CM

## 2014-05-05 DIAGNOSIS — E039 Hypothyroidism, unspecified: Secondary | ICD-10-CM | POA: Diagnosis present

## 2014-05-05 DIAGNOSIS — N179 Acute kidney failure, unspecified: Secondary | ICD-10-CM | POA: Diagnosis present

## 2014-05-05 LAB — CBC
HCT: 25.9 % — ABNORMAL LOW (ref 36.0–46.0)
Hemoglobin: 8.5 g/dL — ABNORMAL LOW (ref 12.0–15.0)
MCH: 31.5 pg (ref 26.0–34.0)
MCHC: 32.8 g/dL (ref 30.0–36.0)
MCV: 95.9 fL (ref 78.0–100.0)
PLATELETS: 87 10*3/uL — AB (ref 150–400)
RBC: 2.7 MIL/uL — AB (ref 3.87–5.11)
RDW: 14.8 % (ref 11.5–15.5)
WBC: 3.1 10*3/uL — AB (ref 4.0–10.5)

## 2014-05-05 LAB — MAGNESIUM: Magnesium: 2.1 mg/dL (ref 1.5–2.5)

## 2014-05-05 LAB — GLUCOSE, CAPILLARY
GLUCOSE-CAPILLARY: 106 mg/dL — AB (ref 70–99)
Glucose-Capillary: 205 mg/dL — ABNORMAL HIGH (ref 70–99)
Glucose-Capillary: 160 mg/dL — ABNORMAL HIGH (ref 70–99)
Glucose-Capillary: 145 mg/dL — ABNORMAL HIGH (ref 70–99)

## 2014-05-05 LAB — BASIC METABOLIC PANEL
ANION GAP: 8 (ref 5–15)
BUN: 22 mg/dL (ref 6–23)
CHLORIDE: 98 mmol/L (ref 96–112)
CO2: 28 mmol/L (ref 19–32)
Calcium: 8 mg/dL — ABNORMAL LOW (ref 8.4–10.5)
Creatinine, Ser: 1.22 mg/dL — ABNORMAL HIGH (ref 0.50–1.10)
GFR calc Af Amer: 55 mL/min — ABNORMAL LOW (ref >90)
GFR, EST NON AFRICAN AMERICAN: 47 mL/min — AB (ref >90)
GLUCOSE: 78 mg/dL (ref 70–99)
POTASSIUM: 4.8 mmol/L (ref 3.5–5.1)
Sodium: 134 mmol/L — ABNORMAL LOW (ref 135–145)

## 2014-05-05 LAB — PHOSPHORUS: Phosphorus: 3.3 mg/dL (ref 2.3–4.6)

## 2014-05-05 LAB — PROCALCITONIN: Procalcitonin: 0.14 ng/mL

## 2014-05-05 LAB — INFLUENZA PANEL BY PCR (TYPE A & B)
H1N1 flu by pcr: NOT DETECTED
INFLAPCR: NEGATIVE
Influenza B By PCR: NEGATIVE

## 2014-05-05 MED ORDER — CETYLPYRIDINIUM CHLORIDE 0.05 % MT LIQD
7.0000 mL | Freq: Two times a day (BID) | OROMUCOSAL | Status: DC
  Administered 2014-05-05 – 2014-05-09 (×9): 7 mL via OROMUCOSAL

## 2014-05-05 MED ORDER — ENOXAPARIN SODIUM 40 MG/0.4ML ~~LOC~~ SOLN
40.0000 mg | SUBCUTANEOUS | Status: DC
  Administered 2014-05-05 – 2014-05-08 (×4): 40 mg via SUBCUTANEOUS
  Filled 2014-05-05 (×5): qty 0.4

## 2014-05-05 NOTE — Progress Notes (Signed)
Inserted 14 fr foley cath per MD order. Pt tolerated well. Urine output was 316ml after foley insertion. Will continue to monitor pt.   K.Ayyub Krall, RN

## 2014-05-05 NOTE — ED Provider Notes (Signed)
CSN: [id number]     Arrival date & time 05/04/14  1112 History   First MD Initiated Contact with Patient 05/04/14 1120     Chief Complaint  Patient presents with  . Hypotension     (Consider location/radiation/quality/duration/timing/severity/associated sxs/prior Treatment) HPI Comments: Pt from home w/ less than 24 hrs of AMS. Pt found to be hypotensive by EMS, was started on peripheral epi en route. Pt is listless, confused upon arrival. Denies CP, SOB, fever, chills, dysuria. She has had some d/a (at baseline).    Past Medical History  Diagnosis Date  . GERD (gastroesophageal reflux disease)   . Colon polyp   . Cirrhosis of liver   . Hypertension   . Depression   . COPD (chronic obstructive pulmonary disease)   . IBS (irritable bowel syndrome)   . On home oxygen therapy     "2L; 24/7" (05/04/2014)  . Family history of adverse reaction to anesthesia     "my sister doesn't wake up good when she's put deep to sleep"  . High cholesterol   . Heart murmur   . CHF (congestive heart failure)   . Asthma   . Pneumonia "several times"  . Chronic bronchitis     "get it pretty much q yr"   . Sleep apnea     "can't tolerate CPAP" (05/04/2014)  . Type II diabetes mellitus   . History of blood transfusion     "related to my anemia"  . Anemia   . Iron deficiency anemia   . Headache     "more than a couple times/wk" (05/04/2014)  . Arthritis     "some in my feet" (05/04/2014)  . Chronic lower back pain   . Anxiety    Past Surgical History  Procedure Laterality Date  . Cesarean section  1979  . Cataract extraction w/ intraocular lens  implant, bilateral Bilateral 2005  . Back surgery    . Colonoscopy  2014  . Upper gastrointestinal endoscopy    . Posterior fusion cervical spine  2015    "rebuilt 3 of my neck vertebrae"  . Incision and drainage of wound Right 2009    "leg mauled  by dog"  . Tubal ligation  1979  . Dilation and curettage of uterus     Family History  Problem  Relation Age of Onset  . Cancer Father     pancreatic   History  Substance Use Topics  . Smoking status: Current Every Day Smoker -- 0.50 packs/day for 48 years    Types: Cigarettes  . Smokeless tobacco: Never Used  . Alcohol Use: No   OB History    Gravida Para Term Preterm AB TAB SAB Ectopic Multiple Living   3 3              Obstetric Comments   1st Menstrual Cycle:  10  1st Pregnancy:  14     Review of Systems  Constitutional: Positive for activity change. Negative for fever, chills, diaphoresis, appetite change and fatigue.  HENT: Negative for congestion, facial swelling, rhinorrhea and sore throat.   Eyes: Negative for photophobia and discharge.  Respiratory: Negative for cough, chest tightness and shortness of breath.   Cardiovascular: Negative for chest pain, palpitations and leg swelling.  Gastrointestinal: Positive for diarrhea. Negative for nausea, vomiting and abdominal pain.  Endocrine: Negative for polydipsia and polyuria.  Genitourinary: Negative for dysuria, frequency, difficulty urinating and pelvic pain.  Musculoskeletal: Negative for back pain, arthralgias, neck pain and  neck stiffness.  Skin: Negative for color change and wound.  Allergic/Immunologic: Negative for immunocompromised state.  Neurological: Negative for facial asymmetry, weakness, numbness and headaches.  Hematological: Does not bruise/bleed easily.  Psychiatric/Behavioral: Positive for confusion. Negative for agitation.      Allergies  Iodine; Latex; Oxycodone; Percocet; Shellfish allergy; Amitriptyline; Wellbutrin; Augmentin; Codeine; Cortisone; Diphenhydramine; Other; and Vicodin  Home Medications   Prior to Admission medications   Medication Sig Start Date End Date Taking? Authorizing Provider  albuterol-ipratropium (COMBIVENT) 18-103 MCG/ACT inhaler Inhale 2 puffs into the lungs 2 (two) times daily.   Yes Historical Provider, MD  allopurinol (ZYLOPRIM) 100 MG tablet Take 100 mg by  mouth 2 (two) times daily.    Yes Historical Provider, MD  ARIPiprazole (ABILIFY) 5 MG tablet Take 5 mg by mouth daily.    Yes Historical Provider, MD  atorvastatin (LIPITOR) 10 MG tablet Take 10 mg by mouth daily.   Yes Historical Provider, MD  budesonide-formoterol (SYMBICORT) 80-4.5 MCG/ACT inhaler Inhale 2 puffs into the lungs 2 (two) times daily as needed (shortness of breath).    Yes Historical Provider, MD  buprenorphine (BUTRANS - DOSED MCG/HR) 10 MCG/HR PTWK patch Place 10 mcg onto the skin once a week.   Yes Historical Provider, MD  clonazePAM (KLONOPIN) 0.5 MG tablet Take 0.5 mg by mouth daily.   Yes Historical Provider, MD  cyclobenzaprine (FLEXERIL) 10 MG tablet Take 10 mg by mouth 4 (four) times daily.    Yes Historical Provider, MD  gabapentin (NEURONTIN) 300 MG capsule Take 300 mg by mouth at bedtime.   Yes Historical Provider, MD  glipiZIDE (GLUCOTROL XL) 10 MG 24 hr tablet Take 10 mg by mouth 2 (two) times daily.   Yes Historical Provider, MD  HYDROmorphone (DILAUDID) 2 MG tablet Take 2 mg by mouth every 6 (six) hours as needed for severe pain.   Yes Historical Provider, MD  lamoTRIgine (LAMICTAL) 150 MG tablet Take 150 mg by mouth at bedtime.    Yes Historical Provider, MD  levothyroxine (SYNTHROID, LEVOTHROID) 75 MCG tablet Take 75 mcg by mouth daily.    Yes Historical Provider, MD  lisinopril (PRINIVIL,ZESTRIL) 20 MG tablet Take 10 mg by mouth 2 (two) times daily.    Yes Historical Provider, MD  magnesium oxide (MAG-OX) 400 MG tablet Take 400 mg by mouth 2 (two) times daily.   Yes Historical Provider, MD  metoprolol tartrate (LOPRESSOR) 25 MG tablet Take 25 mg by mouth 2 (two) times daily.   Yes Historical Provider, MD  montelukast (SINGULAIR) 10 MG tablet Take 10 mg by mouth at bedtime.   Yes Historical Provider, MD  pantoprazole (PROTONIX) 40 MG tablet Take 40 mg by mouth daily.   Yes Historical Provider, MD  potassium chloride (K-DUR) 10 MEQ tablet Take 10 mEq by mouth daily.    Yes Historical Provider, MD  Saxagliptin-Metformin 2.06-998 MG TB24 Take 1 tablet by mouth 2 (two) times daily.   Yes Historical Provider, MD  sertraline (ZOLOFT) 100 MG tablet Take 100 mg by mouth daily.   Yes Historical Provider, MD  spironolactone (ALDACTONE) 50 MG tablet Take 50 mg by mouth daily. 03/31/14 03/31/15 Yes Historical Provider, MD  Vitamin D, Ergocalciferol, (DRISDOL) 50000 UNITS CAPS capsule Take 50,000 Units by mouth every 7 (seven) days.   Yes Historical Provider, MD  Pramoxine-HC (HYDROCORTISONE ACE-PRAMOXINE) 2.5-1 % CREA Apply 1 application topically 2 (two) times daily. 03/15/14   Seeplaputhur Robinette Haines, MD   BP 117/56 mmHg  Pulse 80  Temp(Src) 98.4 F (36.9 C) (Oral)  Resp 19  Ht 5\' 3"  (1.6 m)  Wt 194 lb 1.6 oz (88.043 kg)  BMI 34.39 kg/m2  SpO2 96% Physical Exam  Constitutional: She is oriented to person, place, and time. She appears listless. She is easily aroused. She appears ill. No distress.  HENT:  Head: Normocephalic and atraumatic.  Mouth/Throat: No oropharyngeal exudate.  Eyes: Pupils are equal, round, and reactive to light.  Neck: Normal range of motion. Neck supple.  Cardiovascular: Normal rate, regular rhythm and normal heart sounds.  Exam reveals no gallop and no friction rub.   No murmur heard. Pulmonary/Chest: Effort normal. No respiratory distress. She has no wheezes. She has rales in the right middle field, the right lower field, the left middle field and the left lower field.  Abdominal: Soft. Bowel sounds are normal. She exhibits ascites. She exhibits no distension and no mass. There is no tenderness. There is no rebound and no guarding.  Musculoskeletal: Normal range of motion. She exhibits edema. She exhibits no tenderness.  Neurological: She is oriented to person, place, and time and easily aroused. She appears listless.  Skin: Skin is warm and dry.  Psychiatric: She has a normal mood and affect.    ED Course  Procedures (including critical  care time) Labs Review Labs Reviewed  CBC WITH DIFFERENTIAL/PLATELET - Abnormal; Notable for the following:    RBC 3.15 (*)    Hemoglobin 10.1 (*)    HCT 30.7 (*)    Platelets 110 (*)    All other components within normal limits  BASIC METABOLIC PANEL - Abnormal; Notable for the following:    Sodium 132 (*)    Chloride 91 (*)    CO2 33 (*)    Glucose, Bld 160 (*)    BUN 24 (*)    Creatinine, Ser 1.57 (*)    Calcium 8.2 (*)    GFR calc non Af Amer 35 (*)    GFR calc Af Amer 40 (*)    All other components within normal limits  BRAIN NATRIURETIC PEPTIDE - Abnormal; Notable for the following:    B Natriuretic Peptide 210.9 (*)    All other components within normal limits  URINALYSIS, ROUTINE W REFLEX MICROSCOPIC - Abnormal; Notable for the following:    APPearance HAZY (*)    Nitrite POSITIVE (*)    Leukocytes, UA SMALL (*)    All other components within normal limits  URINE MICROSCOPIC-ADD ON - Abnormal; Notable for the following:    Squamous Epithelial / LPF FEW (*)    Bacteria, UA MANY (*)    All other components within normal limits  HEPATIC FUNCTION PANEL - Abnormal; Notable for the following:    Total Protein 5.3 (*)    Albumin 2.2 (*)    AST 38 (*)    Alkaline Phosphatase 198 (*)    All other components within normal limits  BASIC METABOLIC PANEL - Abnormal; Notable for the following:    Sodium 134 (*)    Creatinine, Ser 1.22 (*)    Calcium 8.0 (*)    GFR calc non Af Amer 47 (*)    GFR calc Af Amer 55 (*)    All other components within normal limits  CBC - Abnormal; Notable for the following:    WBC 3.1 (*)    RBC 2.70 (*)    Hemoglobin 8.5 (*)    HCT 25.9 (*)    Platelets 87 (*)    All other  components within normal limits  GLUCOSE, CAPILLARY - Abnormal; Notable for the following:    Glucose-Capillary 124 (*)    All other components within normal limits  GLUCOSE, CAPILLARY - Abnormal; Notable for the following:    Glucose-Capillary 223 (*)    All other  components within normal limits  GLUCOSE, CAPILLARY - Abnormal; Notable for the following:    Glucose-Capillary 106 (*)    All other components within normal limits  I-STAT ARTERIAL BLOOD GAS, ED - Abnormal; Notable for the following:    pCO2 arterial 54.5 (*)    pO2, Arterial 104.0 (*)    Bicarbonate 34.5 (*)    Acid-Base Excess 8.0 (*)    All other components within normal limits  CULTURE, BLOOD (ROUTINE X 2)  CULTURE, BLOOD (ROUTINE X 2)  URINE CULTURE  AMMONIA  PROCALCITONIN  MAGNESIUM  PHOSPHORUS  PROCALCITONIN  INFLUENZA PANEL BY PCR (TYPE A & B, H1N1)  I-STAT TROPOININ, ED  I-STAT CG4 LACTIC ACID, ED  I-STAT CG4 LACTIC ACID, ED    Imaging Review Dg Chest Port 1 View  05/04/2014   CLINICAL DATA:  Chest pain and hypotension.  EXAM: PORTABLE CHEST - 1 VIEW  COMPARISON:  03/15/2014  FINDINGS: Heart size is moderately enlarged. There is no pleural effusion identified. The lung volumes are low. Bilateral interstitial and airspace opacities are identified.  IMPRESSION: 1. Bilateral interstitial and airspace opacities may reflect pulmonary edema or multifocal infection.   Electronically Signed   By: Kerby Moors M.D.   On: 05/04/2014 12:19     EKG Interpretation   Date/Time:  Friday May 04 2014 11:25:13 EST Ventricular Rate:  85 PR Interval:  165 QRS Duration: 95 QT Interval:  392 QTC Calculation: 466 R Axis:   9 Text Interpretation:  Sinus rhythm no prior Confirmed by DOCHERTY  MD,  MEGAN ([id number]) on 05/04/2014 11:39:49 AM     CRITICAL CARE Performed by: Ernestina Patches, E Total critical care time: 40 Critical care time was exclusive of separately billable procedures and treating other patients. Critical care was necessary to treat or prevent imminent or life-threatening deterioration. Critical care was time spent personally by me on the following activities: development of treatment plan with patient and/or surrogate as well as nursing, discussions with consultants,  evaluation of patient's response to treatment, examination of patient, obtaining history from patient or surrogate, ordering and performing treatments and interventions, ordering and review of laboratory studies, ordering and review of radiographic studies, pulse oximetry and re-evaluation of patient's condition.  MDM   Final diagnoses:  Hypotension  Sepsis, due to unspecified organism    Pt is a 59 y.o. female with Pmhx as above who presents with AMS and hypotension starting this morning. Caregiver reports she was fine yesterday. Pt feels sleepy, Denies pain, fever, chills, cough, though has had some d/a. On PE, she has rales throughout, abdominal scites, 2+ BLLE edema. Initial BP in the 70's. I had concern for acute fluid overload & hypercarbia. 250cc fluid challenge did not improve BP.  Pt briefly started on peripheral levophed by myseself.  ABG without significantly elevated CO2. WBC nml, LA not elevated, CXR with BL interstitial and airspace opacities (edema vs multifocal infection). Though pt did not have hx suggestive of pna, given hypotension, broad spectrum abx started. Ammonia not elevated. I spoke w/ CCM who rec IVF boluses to be given despite my concern for fluid overload. UA returned and is possibly infected. She did have some improvement of BP with IVF boluses  and resp statues remained stable. CCM consulted again for admission.      Ernestina Patches, MD 05/05/14 1027

## 2014-05-05 NOTE — Progress Notes (Signed)
PROGRESS NOTE  Samantha Mcmillan YKD:[id number] DOB: Mar 24, 1955 DOA: 05/04/2014 PCP: Casilda Carls, MD  Assessment/Plan: PNA- HCAP- recent admit at [facility] per patient-- de-escalate abx in AM if still improving R/o flu -droplet precautions  UTI -urine culture -on abx  COPD- nebs/O2  Hypotension- resolved  HTN/HLD- home meds  AKI- gently IVF  Cirrhosis secondary to NAFLD  DM -SSI  Hypothyroid -synthroid  Weakness PT/OT eval   Code Status: full Family Communication:  Disposition Plan:    Consultants: PCCM admit  Procedures:      HPI/Subjective: Feeling better than yesterday Having trouble peeing  Objective: Filed Vitals:   05/05/14 0748  BP: 117/56  Pulse: 80  Temp: 98.4 F (36.9 C)  Resp: 19    Intake/Output Summary (Last 24 hours) at 05/05/14 1104 Last data filed at 05/05/14 0700  Gross per 24 hour  Intake 2294.17 ml  Output    514 ml  Net 1780.17 ml   Filed Weights   05/04/14 1122 05/04/14 2213  Weight: 84.278 kg (185 lb 12.8 oz) 88.043 kg (194 lb 1.6 oz)    Exam:   General:  awake  Cardiovascular: rrr  Respiratory: diminshed, no wheezing  Abdomen: +BS, obese, no fluid wvae  Musculoskeletal: no edema    Data Reviewed: Basic Metabolic Panel:  Recent Labs Lab 05/04/14 1140 05/05/14 0545  NA 132* 134*  K 4.8 4.8  CL 91* 98  CO2 33* 28  GLUCOSE 160* 78  BUN 24* 22  CREATININE 1.57* 1.22*  CALCIUM 8.2* 8.0*  MG  --  2.1  PHOS  --  3.3   Liver Function Tests:  Recent Labs Lab 05/04/14 1825  AST 38*  ALT 17  ALKPHOS 198*  BILITOT 0.5  PROT 5.3*  ALBUMIN 2.2*   No results for input(s): LIPASE, AMYLASE in the last 168 hours.  Recent Labs Lab 05/04/14 1219  AMMONIA 20   CBC:  Recent Labs Lab 05/04/14 1140 05/05/14 0545  WBC 5.6 3.1*  NEUTROABS 3.7  --   HGB 10.1* 8.5*  HCT 30.7* 25.9*  MCV 97.5 95.9  PLT 110* 87*   Cardiac Enzymes: No results for input(s): CKTOTAL, CKMB, CKMBINDEX,  TROPONINI in the last 168 hours. BNP (last 3 results)  Recent Labs  05/04/14 1140  BNP 210.9*    ProBNP (last 3 results) No results for input(s): PROBNP in the last 8760 hours.  CBG:  Recent Labs Lab 05/04/14 1714 05/04/14 2211 05/05/14 0746  GLUCAP 124* 223* 106*    Recent Results (from the past 240 hour(s))  Blood culture (routine x 2)     Status: None (Preliminary result)   Collection Time: 05/04/14 11:40 AM  Result Value Ref Range Status   Specimen Description BLOOD RIGHT WRIST  Final   Special Requests BOTTLES DRAWN AEROBIC AND ANAEROBIC 5MLS  Final   Culture   Final           BLOOD CULTURE RECEIVED NO GROWTH TO DATE CULTURE WILL BE HELD FOR 5 DAYS BEFORE ISSUING A FINAL NEGATIVE REPORT Performed at [facility]    Report Status PENDING  Incomplete  Blood culture (routine x 2)     Status: None (Preliminary result)   Collection Time: 05/04/14 12:19 PM  Result Value Ref Range Status   Specimen Description BLOOD BLOOD RIGHT FOREARM  Final   Special Requests BOTTLES DRAWN AEROBIC AND ANAEROBIC 5CC  Final   Culture   Final           BLOOD CULTURE  RECEIVED NO GROWTH TO DATE CULTURE WILL BE HELD FOR 5 DAYS BEFORE ISSUING A FINAL NEGATIVE REPORT Performed at [facility]    Report Status PENDING  Incomplete     Studies: Dg Chest Port 1 View  05/04/2014   CLINICAL DATA:  Chest pain and hypotension.  EXAM: PORTABLE CHEST - 1 VIEW  COMPARISON:  03/15/2014  FINDINGS: Heart size is moderately enlarged. There is no pleural effusion identified. The lung volumes are low. Bilateral interstitial and airspace opacities are identified.  IMPRESSION: 1. Bilateral interstitial and airspace opacities may reflect pulmonary edema or multifocal infection.   Electronically Signed   By: Kerby Moors M.D.   On: 05/04/2014 12:19    Scheduled Meds: . ARIPiprazole  5 mg Oral Daily  . atorvastatin  10 mg Oral Daily  . budesonide-formoterol  2 puff Inhalation BID  . ceFEPime  (MAXIPIME) IV  2 g Intravenous Q24H  . enoxaparin (LOVENOX) injection  30 mg Subcutaneous Q24H  . insulin aspart  0-5 Units Subcutaneous QHS  . insulin aspart  0-9 Units Subcutaneous TID WC  . ipratropium-albuterol  3 mL Inhalation BID  . lamoTRIgine  150 mg Oral QHS  . levothyroxine  75 mcg Oral QAC breakfast  . pantoprazole  40 mg Oral Daily  . sertraline  100 mg Oral Daily  . vancomycin  750 mg Intravenous Q12H   Continuous Infusions: . sodium chloride 50 mL/hr at 05/04/14 1855   Antibiotics Given (last 72 hours)    Date/Time Action Medication Dose Rate   05/05/14 0604 Given   vancomycin (VANCOCIN) IVPB 750 mg/150 ml premix 750 mg 150 mL/hr      Active Problems:   Hypotension   UTI (urinary tract infection)   AKI (acute kidney injury)   Hypothyroidism    Time spent: 35 min    Ethaniel Garfield  [facility] Pager [phone]. If 7PM-7AM, please contact night-coverage at [email or web address], password [facility] 05/05/2014, 11:04 AM  LOS: 1 day

## 2014-05-05 NOTE — Progress Notes (Signed)
Pt has not voided. Bladder scan showed 999 ml. Pt put on bedside commode and unable to void. MD notified. Awaiting orders.   K.Bonni Neuser, RN

## 2014-05-06 DIAGNOSIS — J189 Pneumonia, unspecified organism: Principal | ICD-10-CM

## 2014-05-06 LAB — CBC
HCT: 25.6 % — ABNORMAL LOW (ref 36.0–46.0)
Hemoglobin: 8.5 g/dL — ABNORMAL LOW (ref 12.0–15.0)
MCH: 31.6 pg (ref 26.0–34.0)
MCHC: 33.2 g/dL (ref 30.0–36.0)
MCV: 95.2 fL (ref 78.0–100.0)
Platelets: 80 10*3/uL — ABNORMAL LOW (ref 150–400)
RBC: 2.69 MIL/uL — ABNORMAL LOW (ref 3.87–5.11)
RDW: 14.7 % (ref 11.5–15.5)
WBC: 3.7 10*3/uL — AB (ref 4.0–10.5)

## 2014-05-06 LAB — BASIC METABOLIC PANEL
Anion gap: 3 — ABNORMAL LOW (ref 5–15)
BUN: 15 mg/dL (ref 6–23)
CO2: 30 mmol/L (ref 19–32)
CREATININE: 1.07 mg/dL (ref 0.50–1.10)
Calcium: 8 mg/dL — ABNORMAL LOW (ref 8.4–10.5)
Chloride: 98 mmol/L (ref 96–112)
GFR, EST AFRICAN AMERICAN: 64 mL/min — AB (ref >90)
GFR, EST NON AFRICAN AMERICAN: 55 mL/min — AB (ref >90)
GLUCOSE: 132 mg/dL — AB (ref 70–99)
Potassium: 4.6 mmol/L (ref 3.5–5.1)
Sodium: 131 mmol/L — ABNORMAL LOW (ref 135–145)

## 2014-05-06 LAB — GLUCOSE, CAPILLARY
GLUCOSE-CAPILLARY: 115 mg/dL — AB (ref 70–99)
Glucose-Capillary: 156 mg/dL — ABNORMAL HIGH (ref 70–99)
Glucose-Capillary: 158 mg/dL — ABNORMAL HIGH (ref 70–99)
Glucose-Capillary: 103 mg/dL — ABNORMAL HIGH (ref 70–99)

## 2014-05-06 MED ORDER — LINAGLIPTIN 5 MG PO TABS
5.0000 mg | ORAL_TABLET | Freq: Every day | ORAL | Status: DC
  Filled 2014-05-06 (×2): qty 1

## 2014-05-06 MED ORDER — GLIPIZIDE ER 10 MG PO TB24
10.0000 mg | ORAL_TABLET | Freq: Two times a day (BID) | ORAL | Status: DC
  Administered 2014-05-06: 10 mg via ORAL
  Filled 2014-05-06 (×4): qty 1

## 2014-05-06 MED ORDER — METFORMIN HCL 500 MG PO TABS
1000.0000 mg | ORAL_TABLET | Freq: Two times a day (BID) | ORAL | Status: DC
Start: 2014-05-06 — End: 2014-05-07
  Administered 2014-05-06: 1000 mg via ORAL
  Filled 2014-05-06 (×4): qty 2

## 2014-05-06 MED ORDER — ONDANSETRON HCL 4 MG/2ML IJ SOLN
4.0000 mg | Freq: Four times a day (QID) | INTRAMUSCULAR | Status: AC | PRN
  Administered 2014-05-06 – 2014-05-07 (×2): 4 mg via INTRAVENOUS
  Filled 2014-05-06 (×2): qty 2

## 2014-05-06 MED ORDER — LEVOFLOXACIN 750 MG PO TABS
750.0000 mg | ORAL_TABLET | Freq: Every day | ORAL | Status: DC
  Administered 2014-05-06 – 2014-05-07 (×2): 750 mg via ORAL
  Filled 2014-05-06 (×2): qty 1

## 2014-05-06 MED ORDER — SPIRONOLACTONE 50 MG PO TABS
50.0000 mg | ORAL_TABLET | Freq: Every day | ORAL | Status: DC
  Administered 2014-05-06 – 2014-05-09 (×4): 50 mg via ORAL
  Filled 2014-05-06 (×4): qty 1

## 2014-05-06 MED ORDER — MONTELUKAST SODIUM 10 MG PO TABS
10.0000 mg | ORAL_TABLET | Freq: Every day | ORAL | Status: DC
  Administered 2014-05-06 – 2014-05-08 (×3): 10 mg via ORAL
  Filled 2014-05-06 (×4): qty 1

## 2014-05-06 MED ORDER — SAXAGLIPTIN-METFORMIN ER 2.5-1000 MG PO TB24: 1.0000 | ORAL_TABLET | Freq: Two times a day (BID) | ORAL | Status: DC

## 2014-05-06 MED ORDER — MAGNESIUM OXIDE 400 (241.3 MG) MG PO TABS
400.0000 mg | ORAL_TABLET | Freq: Two times a day (BID) | ORAL | Status: DC
  Administered 2014-05-06 – 2014-05-07 (×3): 400 mg via ORAL
  Filled 2014-05-06 (×4): qty 1

## 2014-05-06 MED ORDER — GABAPENTIN 300 MG PO CAPS
300.0000 mg | ORAL_CAPSULE | Freq: Every day | ORAL | Status: DC
  Administered 2014-05-06 – 2014-05-08 (×3): 300 mg via ORAL
  Filled 2014-05-06 (×5): qty 1

## 2014-05-06 NOTE — Progress Notes (Signed)
UR Completed.  [phone]  

## 2014-05-06 NOTE — Progress Notes (Signed)
PROGRESS NOTE  Samantha Mcmillan IRC:[id number] DOB: 1955/05/31 DOA: 05/04/2014 PCP: Casilda Carls, MD  Assessment/Plan: PNA- HCAP- recent admit at [facility] per patient-- de-escalate abx  This AM  flu negative  UTI- gram neg rods -urine culture -on abx  COPD- nebs/O2  Hypotension- resolved  HTN/HLD- home meds  AKI- gently IVF  Cirrhosis secondary to NAFLD  DM -SSI  Hypothyroid -synthroid  Weakness PT/OT eval  Acute urinary retention Foley place 3/13  Code Status: full Family Communication: patient Disposition Plan:    Consultants: PCCM admit  Procedures:      HPI/Subjective: Asking about going home  Objective: Filed Vitals:   05/06/14 0502  BP: 144/58  Pulse: 95  Temp: 98.2 F (36.8 C)  Resp: 17    Intake/Output Summary (Last 24 hours) at 05/06/14 1103 Last data filed at 05/06/14 0600  Gross per 24 hour  Intake   1900 ml  Output   2901 ml  Net  -1001 ml   Filed Weights   05/04/14 1122 05/04/14 2213 05/05/14 2044  Weight: 84.278 kg (185 lb 12.8 oz) 88.043 kg (194 lb 1.6 oz) 88.497 kg (195 lb 1.6 oz)    Exam:   General:  awake  Cardiovascular: rrr  Respiratory: diminshed, no wheezing  Abdomen: +BS, obese, less distended  Musculoskeletal: no edema    Data Reviewed: Basic Metabolic Panel:  Recent Labs Lab 05/04/14 1140 05/05/14 0545 05/06/14 0039  NA 132* 134* 131*  K 4.8 4.8 4.6  CL 91* 98 98  CO2 33* 28 30  GLUCOSE 160* 78 132*  BUN 24* 22 15  CREATININE 1.57* 1.22* 1.07  CALCIUM 8.2* 8.0* 8.0*  MG  --  2.1  --   PHOS  --  3.3  --    Liver Function Tests:  Recent Labs Lab 05/04/14 1825  AST 38*  ALT 17  ALKPHOS 198*  BILITOT 0.5  PROT 5.3*  ALBUMIN 2.2*   No results for input(s): LIPASE, AMYLASE in the last 168 hours.  Recent Labs Lab 05/04/14 1219  AMMONIA 20   CBC:  Recent Labs Lab 05/04/14 1140 05/05/14 0545 05/06/14 0039  WBC 5.6 3.1* 3.7*  NEUTROABS 3.7  --   --   HGB 10.1* 8.5*  8.5*  HCT 30.7* 25.9* 25.6*  MCV 97.5 95.9 95.2  PLT 110* 87* 80*   Cardiac Enzymes: No results for input(s): CKTOTAL, CKMB, CKMBINDEX, TROPONINI in the last 168 hours. BNP (last 3 results)  Recent Labs  05/04/14 1140  BNP 210.9*    ProBNP (last 3 results) No results for input(s): PROBNP in the last 8760 hours.  CBG:  Recent Labs Lab 05/05/14 0746 05/05/14 1209 05/05/14 1710 05/05/14 2040 05/06/14 0838  GLUCAP 106* 205* 160* 145* 156*    Recent Results (from the past 240 hour(s))  Blood culture (routine x 2)     Status: None (Preliminary result)   Collection Time: 05/04/14 11:40 AM  Result Value Ref Range Status   Specimen Description BLOOD RIGHT WRIST  Final   Special Requests BOTTLES DRAWN AEROBIC AND ANAEROBIC 5MLS  Final   Culture   Final           BLOOD CULTURE RECEIVED NO GROWTH TO DATE CULTURE WILL BE HELD FOR 5 DAYS BEFORE ISSUING A FINAL NEGATIVE REPORT Performed at [facility]    Report Status PENDING  Incomplete  Blood culture (routine x 2)     Status: None (Preliminary result)   Collection Time: 05/04/14 12:19 PM  Result  Value Ref Range Status   Specimen Description BLOOD BLOOD RIGHT FOREARM  Final   Special Requests BOTTLES DRAWN AEROBIC AND ANAEROBIC 5CC  Final   Culture   Final           BLOOD CULTURE RECEIVED NO GROWTH TO DATE CULTURE WILL BE HELD FOR 5 DAYS BEFORE ISSUING A FINAL NEGATIVE REPORT Performed at [facility]    Report Status PENDING  Incomplete  Urine culture     Status: None (Preliminary result)   Collection Time: 05/04/14  1:37 PM  Result Value Ref Range Status   Specimen Description URINE, RANDOM  Final   Special Requests Normal  Final   Colony Count   Final    >=100,000 COLONIES/ML Performed at [facility]    Culture   Final    [place] Performed at [facility]    Report Status PENDING  Incomplete     Studies: Dg Chest Port 1 View  05/04/2014   CLINICAL DATA:  Chest pain  and hypotension.  EXAM: PORTABLE CHEST - 1 VIEW  COMPARISON:  03/15/2014  FINDINGS: Heart size is moderately enlarged. There is no pleural effusion identified. The lung volumes are low. Bilateral interstitial and airspace opacities are identified.  IMPRESSION: 1. Bilateral interstitial and airspace opacities may reflect pulmonary edema or multifocal infection.   Electronically Signed   By: Kerby Moors M.D.   On: 05/04/2014 12:19    Scheduled Meds: . antiseptic oral rinse  7 mL Mouth Rinse BID  . ARIPiprazole  5 mg Oral Daily  . atorvastatin  10 mg Oral Daily  . budesonide-formoterol  2 puff Inhalation BID  . ceFEPime (MAXIPIME) IV  2 g Intravenous Q24H  . enoxaparin (LOVENOX) injection  40 mg Subcutaneous Q24H  . insulin aspart  0-5 Units Subcutaneous QHS  . insulin aspart  0-9 Units Subcutaneous TID WC  . ipratropium-albuterol  3 mL Inhalation BID  . lamoTRIgine  150 mg Oral QHS  . levothyroxine  75 mcg Oral QAC breakfast  . pantoprazole  40 mg Oral Daily  . sertraline  100 mg Oral Daily  . vancomycin  750 mg Intravenous Q12H   Continuous Infusions: . sodium chloride 50 mL/hr at 05/04/14 1855   Antibiotics Given (last 72 hours)    Date/Time Action Medication Dose Rate   05/05/14 0604 Given   vancomycin (VANCOCIN) IVPB 750 mg/150 ml premix 750 mg 150 mL/hr   05/05/14 1330 Given   ceFEPIme (MAXIPIME) 2 g in dextrose 5 % 50 mL IVPB 2 g 100 mL/hr   05/05/14 1728 Given   vancomycin (VANCOCIN) IVPB 750 mg/150 ml premix 750 mg 150 mL/hr   05/06/14 0549 Given   vancomycin (VANCOCIN) IVPB 750 mg/150 ml premix 750 mg 150 mL/hr      Active Problems:   Hypotension   UTI (urinary tract infection)   AKI (acute kidney injury)   Hypothyroidism    Time spent: 25 min    Hillari Zumwalt, [facility] Pager [phone]. If 7PM-7AM, please contact night-coverage at [email or web address], password [facility] 05/06/2014, 11:03 AM  LOS: 2 days

## 2014-05-06 NOTE — Evaluation (Signed)
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  Clinical Impression  Pt admitted with above diagnosis. Pt currently with functional limitations due to the deficits listed below (see PT Problem List). At the time of PT eval pt was able to perform transfers and ambulation with min guard assist. Increased time/attempts to achieve full standing. Pt will benefit from skilled PT to increase their independence and safety with mobility to allow discharge to the venue listed below.  Pt states that she has been working with [facility] prior to admission, and would like to return home with continuation of these services.      Follow Up Recommendations Home health PT;Supervision/Assistance - 24 hour    Equipment Recommendations  None recommended by PT    Recommendations for Other Services       Precautions / Restrictions Precautions Precautions: Fall Restrictions Weight Bearing Restrictions: No      Mobility  Bed Mobility Overal bed mobility: Needs Assistance Bed Mobility: Supine to Sit     Supine to sit: Supervision     General bed mobility comments: Supervision for safety  Transfers Overall transfer level: Needs assistance Equipment used: Rolling walker (2 wheeled) Transfers: Sit to/from Stand Sit to Stand: Min guard         General transfer comment: x3 attempts to achieve full standing position. VC's for hand placement on seated surface for safety. Was able to power-up to full stand on third attempt without assistance.   Ambulation/Gait Ambulation/Gait assistance: Min guard Ambulation Distance (Feet): 30 Feet Assistive device: Rolling walker (2 wheeled) Gait Pattern/deviations: Step-through pattern;Decreased stride length;Trunk  flexed Gait velocity: Decreased Gait velocity interpretation: Below normal speed for age/gender General Gait Details: Pt was able to ambulate in room with RW and O2 donned. Unsteady at times however did not require physical assist to prevent a fall. Close guard only.   Stairs            Wheelchair Mobility    Modified Rankin (Stroke Patients Only)       Balance Overall balance assessment: Needs assistance Sitting-balance support: Feet supported;No upper extremity supported Sitting balance-Leahy Scale: Fair     Standing balance support: Bilateral upper extremity supported;During functional activity Standing balance-Leahy Scale: Fair                               Pertinent Vitals/Pain Pain Assessment: No/denies pain    Home Living Family/patient expects to be discharged to:: Private residence Living Arrangements: Non-relatives/Friends Available Help at Discharge: Family;Available 24 hours/day Type of Home: House Home Access: Stairs to enter Entrance Stairs-Rails: Right;Left;Can reach both Entrance Stairs-Number of Steps: 3 Home Layout: One level Home Equipment: Walker - 2 wheels;Bedside commode      Prior Function Level of Independence: Needs assistance   Gait / Transfers Assistance Needed: Used walker occasionally.  ADL's / Homemaking Assistance Needed: Pt states her potty chair is not a 3-in-1 and does not have a shower seat. She needs assist to wash.        Hand Dominance   Dominant Hand: Right    Extremity/Trunk Assessment   Upper Extremity Assessment: Defer to OT evaluation           Lower  Extremity Assessment: Generalized weakness      Cervical / Trunk Assessment: Normal  Communication   Communication: No difficulties  Cognition Arousal/Alertness: Lethargic Behavior During Therapy: Flat affect Overall Cognitive Status: Within Functional Limits for tasks assessed                      General Comments       Exercises        Assessment/Plan    PT Assessment Patient needs continued PT services  PT Diagnosis Difficulty walking;Generalized weakness   PT Problem List Decreased strength;Decreased range of motion;Decreased activity tolerance;Decreased balance;Decreased mobility;Decreased knowledge of use of DME;Decreased safety awareness;Decreased knowledge of precautions  PT Treatment Interventions DME instruction;Gait training;Stair training;Functional mobility training;Therapeutic activities;Therapeutic exercise;Neuromuscular re-education;Patient/family education   PT Goals (Current goals can be found in the Care Plan section) Acute Rehab PT Goals Patient Stated Goal: Home PT Goal Formulation: With patient Time For Goal Achievement: 05/13/14 Potential to Achieve Goals: Good    Frequency Min 3X/week   Barriers to discharge        Co-evaluation               End of Session Equipment Utilized During Treatment: Gait belt Activity Tolerance: Patient tolerated treatment well Patient left: in chair;with call bell/phone within reach Nurse Communication: Mobility status         Time: 1100-1130 PT Time Calculation (min) (ACUTE ONLY): 30 min   Charges:   PT Evaluation $Initial PT Evaluation Tier I: 1 Procedure PT Treatments $Therapeutic Activity: 8-22 mins   PT G Codes:        Rolinda Roan 05/15/2014, 12:59 PM   Rolinda Roan, PT, DPT Acute Rehabilitation Services Pager: [phone]

## 2014-05-07 DIAGNOSIS — R112 Nausea with vomiting, unspecified: Secondary | ICD-10-CM | POA: Diagnosis not present

## 2014-05-07 DIAGNOSIS — IMO0001 Reserved for inherently not codable concepts without codable children: Secondary | ICD-10-CM

## 2014-05-07 DIAGNOSIS — N390 Urinary tract infection, site not specified: Secondary | ICD-10-CM

## 2014-05-07 LAB — BASIC METABOLIC PANEL
Anion gap: 4 — ABNORMAL LOW (ref 5–15)
BUN: 9 mg/dL (ref 6–23)
CO2: 29 mmol/L (ref 19–32)
Calcium: 8.4 mg/dL (ref 8.4–10.5)
Chloride: 99 mmol/L (ref 96–112)
Creatinine, Ser: 0.86 mg/dL (ref 0.50–1.10)
GFR calc Af Amer: 83 mL/min — ABNORMAL LOW (ref >90)
GFR calc non Af Amer: 72 mL/min — ABNORMAL LOW (ref >90)
GLUCOSE: 53 mg/dL — AB (ref 70–99)
Potassium: 4.6 mmol/L (ref 3.5–5.1)
SODIUM: 132 mmol/L — AB (ref 135–145)

## 2014-05-07 LAB — GLUCOSE, CAPILLARY
GLUCOSE-CAPILLARY: 95 mg/dL (ref 70–99)
GLUCOSE-CAPILLARY: 52 mg/dL — AB (ref 70–99)
GLUCOSE-CAPILLARY: 55 mg/dL — AB (ref 70–99)
GLUCOSE-CAPILLARY: 132 mg/dL — AB (ref 70–99)
Glucose-Capillary: 67 mg/dL — ABNORMAL LOW (ref 70–99)
Glucose-Capillary: 112 mg/dL — ABNORMAL HIGH (ref 70–99)
Glucose-Capillary: 95 mg/dL (ref 70–99)

## 2014-05-07 LAB — CBC
HCT: 27.1 % — ABNORMAL LOW (ref 36.0–46.0)
Hemoglobin: 8.9 g/dL — ABNORMAL LOW (ref 12.0–15.0)
MCH: 31 pg (ref 26.0–34.0)
MCHC: 32.8 g/dL (ref 30.0–36.0)
MCV: 94.4 fL (ref 78.0–100.0)
Platelets: 95 10*3/uL — ABNORMAL LOW (ref 150–400)
RBC: 2.87 MIL/uL — ABNORMAL LOW (ref 3.87–5.11)
RDW: 14.6 % (ref 11.5–15.5)
WBC: 3.8 10*3/uL — ABNORMAL LOW (ref 4.0–10.5)

## 2014-05-07 LAB — URINE CULTURE
Colony Count: >=100000
SPECIAL REQUESTS: NORMAL

## 2014-05-07 MED ORDER — LEVOFLOXACIN IN D5W 500 MG/100ML IV SOLN
500.0000 mg | INTRAVENOUS | Status: DC
  Filled 2014-05-07: qty 100

## 2014-05-07 MED ORDER — METOPROLOL TARTRATE 12.5 MG HALF TABLET
12.5000 mg | ORAL_TABLET | Freq: Two times a day (BID) | ORAL | Status: DC
  Administered 2014-05-07 – 2014-05-09 (×4): 12.5 mg via ORAL
  Filled 2014-05-07 (×5): qty 1

## 2014-05-07 MED ORDER — INSULIN ASPART 100 UNIT/ML ~~LOC~~ SOLN: 0.0000 [IU] | Freq: Three times a day (TID) | SUBCUTANEOUS | Status: DC

## 2014-05-07 MED ORDER — ONDANSETRON HCL 4 MG/2ML IJ SOLN
4.0000 mg | Freq: Three times a day (TID) | INTRAMUSCULAR | Status: DC
  Administered 2014-05-07: 4 mg via INTRAVENOUS
  Filled 2014-05-07 (×2): qty 2

## 2014-05-07 MED ORDER — CLONAZEPAM 0.5 MG PO TABS
0.5000 mg | ORAL_TABLET | Freq: Every day | ORAL | Status: DC | PRN
Start: 2014-05-07 — End: 2014-05-09

## 2014-05-07 NOTE — Progress Notes (Signed)
PROGRESS NOTE  Samantha Mcmillan MCN:[id number] DOB: 06-30-1955 DOA: 05/04/2014 PCP: Casilda Carls, MD  Assessment/Plan: PNA- HCAP- now having nausea and vomiting since PO levaquin started. Will change to IV levation  UTI, e coli, pansensitive.  levaquin covers.  Nausea vomiting.  Schedule zofran. Change levaquin to IV.  COPD- nebs/O2. Still smoking. Counseled against  Chronic hypoxia: on home oxygen  Hypotension- resolved  HTN/HLD- home meds  AKI- improving  Cirrhosis secondary to NAFLD  DM with hypoglycemia. Hold po med and hs coverage. SSI only for now  Hypothyroid -synthroid  Weakness Home health  Acute urinary retention Foley place 3/13. Voiding trial soon  Code Status: full Family Communication: patient Disposition Plan:    Consultants: PCCM admit  Procedures:      HPI/Subjective: C/o nausea. Vomited last night worse after po levaquin. No dyspnea. Not eating  Objective: Filed Vitals:   05/07/14 0900  BP: 116/57  Pulse: 101  Temp: 98.2 F (36.8 C)  Resp: 16    Intake/Output Summary (Last 24 hours) at 05/07/14 1511 Last data filed at 05/07/14 1403  Gross per 24 hour  Intake    600 ml  Output   3001 ml  Net  -2401 ml   Filed Weights   05/04/14 2213 05/05/14 2044 05/06/14 2031  Weight: 88.043 kg (194 lb 1.6 oz) 88.497 kg (195 lb 1.6 oz) 88.678 kg (195 lb 8 oz)    Exam:   General:  In chair. A and o. Breathing nonlabored  Cardiovascular: rrr without mgr  Respiratory: cta without wrr  Abdomen: +BS, s, nt, nd  Musculoskeletal: no edema    Data Reviewed: Basic Metabolic Panel:  Recent Labs Lab 05/04/14 1140 05/05/14 0545 05/06/14 0039 05/07/14 0601  NA 132* 134* 131* 132*  K 4.8 4.8 4.6 4.6  CL 91* 98 98 99  CO2 33* 28 30 29   GLUCOSE 160* 78 132* 53*  BUN 24* 22 15 9   CREATININE 1.57* 1.22* 1.07 0.86  CALCIUM 8.2* 8.0* 8.0* 8.4  MG  --  2.1  --   --   PHOS  --  3.3  --   --    Liver Function Tests:  Recent  Labs Lab 05/04/14 1825  AST 38*  ALT 17  ALKPHOS 198*  BILITOT 0.5  PROT 5.3*  ALBUMIN 2.2*   No results for input(s): LIPASE, AMYLASE in the last 168 hours.  Recent Labs Lab 05/04/14 1219  AMMONIA 20   CBC:  Recent Labs Lab 05/04/14 1140 05/05/14 0545 05/06/14 0039 05/07/14 0601  WBC 5.6 3.1* 3.7* 3.8*  NEUTROABS 3.7  --   --   --   HGB 10.1* 8.5* 8.5* 8.9*  HCT 30.7* 25.9* 25.6* 27.1*  MCV 97.5 95.9 95.2 94.4  PLT 110* 87* 80* 95*   Cardiac Enzymes: No results for input(s): CKTOTAL, CKMB, CKMBINDEX, TROPONINI in the last 168 hours. BNP (last 3 results)  Recent Labs  05/04/14 1140  BNP 210.9*    ProBNP (last 3 results) No results for input(s): PROBNP in the last 8760 hours.  CBG:  Recent Labs Lab 05/06/14 1713 05/06/14 2019 05/07/14 0739 05/07/14 0837 05/07/14 1158  GLUCAP 103* 115* 67* 112* 95    Recent Results (from the past 240 hour(s))  Blood culture (routine x 2)     Status: None (Preliminary result)   Collection Time: 05/04/14 11:40 AM  Result Value Ref Range Status   Specimen Description BLOOD RIGHT WRIST  Final   Special Requests BOTTLES DRAWN AEROBIC AND  ANAEROBIC 5MLS  Final   Culture   Final           BLOOD CULTURE RECEIVED NO GROWTH TO DATE CULTURE WILL BE HELD FOR 5 DAYS BEFORE ISSUING A FINAL NEGATIVE REPORT Performed at [facility]    Report Status PENDING  Incomplete  Blood culture (routine x 2)     Status: None (Preliminary result)   Collection Time: 05/04/14 12:19 PM  Result Value Ref Range Status   Specimen Description BLOOD BLOOD RIGHT FOREARM  Final   Special Requests BOTTLES DRAWN AEROBIC AND ANAEROBIC 5CC  Final   Culture   Final           BLOOD CULTURE RECEIVED NO GROWTH TO DATE CULTURE WILL BE HELD FOR 5 DAYS BEFORE ISSUING A FINAL NEGATIVE REPORT Performed at [facility]    Report Status PENDING  Incomplete  Urine culture     Status: None   Collection Time: 05/04/14  1:37 PM  Result Value Ref  Range Status   Specimen Description URINE, RANDOM  Final   Special Requests Normal  Final   Colony Count   Final    >=100,000 COLONIES/ML Performed at [facility]    Culture   Final    ESCHERICHIA COLI Performed at [facility]    Report Status 05/07/2014 FINAL  Final   Organism ID, Bacteria ESCHERICHIA COLI  Final      Susceptibility   Escherichia coli - MIC*    AMPICILLIN <=2 SENSITIVE Sensitive     CEFAZOLIN <=4 SENSITIVE Sensitive     CEFTRIAXONE <=1 SENSITIVE Sensitive     CIPROFLOXACIN <=0.25 SENSITIVE Sensitive     GENTAMICIN <=1 SENSITIVE Sensitive     LEVOFLOXACIN <=0.12 SENSITIVE Sensitive     NITROFURANTOIN <=16 SENSITIVE Sensitive     TOBRAMYCIN <=1 SENSITIVE Sensitive     TRIMETH/SULFA <=20 SENSITIVE Sensitive     PIP/TAZO <=4 SENSITIVE Sensitive     * ESCHERICHIA COLI     Studies: No results found.  Scheduled Meds: . antiseptic oral rinse  7 mL Mouth Rinse BID  . ARIPiprazole  5 mg Oral Daily  . budesonide-formoterol  2 puff Inhalation BID  . enoxaparin (LOVENOX) injection  40 mg Subcutaneous Q24H  . gabapentin  300 mg Oral QHS  . insulin aspart  0-9 Units Subcutaneous TID WC  . ipratropium-albuterol  3 mL Inhalation BID  . lamoTRIgine  150 mg Oral QHS  . levofloxacin  750 mg Oral Daily  . levothyroxine  75 mcg Oral QAC breakfast  . metoprolol tartrate  12.5 mg Oral BID  . montelukast  10 mg Oral QHS  . ondansetron (ZOFRAN) IV  4 mg Intravenous TID AC  . pantoprazole  40 mg Oral Daily  . sertraline  100 mg Oral Daily  . spironolactone  50 mg Oral Daily   Continuous Infusions:   Antibiotics Given (last 72 hours)    Date/Time Action Medication Dose Rate   05/05/14 0604 Given   vancomycin (VANCOCIN) IVPB 750 mg/150 ml premix 750 mg 150 mL/hr   05/05/14 1330 Given   ceFEPIme (MAXIPIME) 2 g in dextrose 5 % 50 mL IVPB 2 g 100 mL/hr   05/05/14 1728 Given   vancomycin (VANCOCIN) IVPB 750 mg/150 ml premix 750 mg 150 mL/hr   05/06/14  0549 Given   vancomycin (VANCOCIN) IVPB 750 mg/150 ml premix 750 mg 150 mL/hr   05/06/14 1252 Given   levofloxacin (LEVAQUIN) tablet 750 mg 750 mg  05/07/14 1020 Given   levofloxacin (LEVAQUIN) tablet 750 mg 750 mg       Time spent: 25 min  [facility] [email or web address], password [facility] 05/07/2014, 3:11 PM  LOS: 3 days

## 2014-05-07 NOTE — Progress Notes (Signed)
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    PT Comments    Pt progressing towards physical therapy goals. Nausea was limiting mobility, however pt motivated to get OOB and transfer to the chair. Encouarged HEP throughout the day, and pt/caregiver were educated on safety and home set-up at d/c. Will continue to follow and progress as able per POC.   Follow Up Recommendations  Home health PT;Supervision/Assistance - 24 hour     Equipment Recommendations  None recommended by PT    Recommendations for Other Services       Precautions / Restrictions Precautions Precautions: Fall Restrictions Weight Bearing Restrictions: No    Mobility  Bed Mobility Overal bed mobility: Needs Assistance Bed Mobility: Supine to Sit     Supine to sit: Supervision     General bed mobility comments: Supervision for safety. Increased time to complete full scoot to edge of bed.   Transfers Overall transfer level: Needs assistance Equipment used: Rolling walker (2 wheeled) Transfers: Sit to/from [employer] Sit to Stand: Min guard Stand pivot transfers: Min guard       General transfer comment: x3 attempts to achieve full standing position. VC's for hand placement on seated surface for safety. Was able to power-up to full stand on third attempt with increased time but without assistance. Min guard assist to SPT to recliner.   Ambulation/Gait             General Gait Details: Deferred this date due to nausea. Pt had been vomiting reportedly throughout the morning. RN provided nausea medication.    Stairs            Wheelchair Mobility    Modified Rankin (Stroke Patients Only)       Balance Overall  balance assessment: Needs assistance Sitting-balance support: Feet supported;No upper extremity supported Sitting balance-Leahy Scale: Fair     Standing balance support: Bilateral upper extremity supported;During functional activity Standing balance-Leahy Scale: Poor                      Cognition Arousal/Alertness: Lethargic Behavior During Therapy: Flat affect Overall Cognitive Status: Within Functional Limits for tasks assessed                      Exercises Total Joint Exercises Quad Sets:  (12 reps bilaterally) Gluteal Sets:  (12 reps bilaterally) Hip ABduction/ADduction:  (12 reps bilaterally) Long Arc Quad:  (12 reps bilaterally)    General Comments        Pertinent Vitals/Pain Pain Assessment: No/denies pain    Home Living                      Prior Function            PT Goals (current goals can now be found in the care plan section) Acute Rehab PT Goals Patient Stated Goal: Home PT Goal Formulation: With patient Time For Goal Achievement: 05/13/14 Potential to Achieve Goals: Good Progress towards PT goals: Progressing toward goals    Frequency  Min 3X/week    PT Plan Current plan remains appropriate    Co-evaluation             End of  Session Equipment Utilized During Treatment: Gait belt;Oxygen Activity Tolerance: Treatment limited secondary to medical complications (Comment) (Signficant nausea) Patient left: in chair;with call bell/phone within reach     Time: 0936-1005 PT Time Calculation (min) (ACUTE ONLY): 29 min  Charges:  $Therapeutic Exercise: 8-22 mins $Therapeutic Activity: 8-22 mins                    G Codes:      Rolinda Roan 06-04-2014, 10:44 AM  Rolinda Roan, PT, DPT Acute Rehabilitation Services Pager: [phone]

## 2014-05-07 NOTE — Clinical Documentation Improvement (Signed)
Conflicting documentation is seen in chart regarding sepsis and encephalopathy. Neither diagnoses noted in Hospital Problem List.  Please clarify and document in your next progress note if Sepsis: ruled in or ruled out                          Encephalopathy: ruled in or ruled out  Thank You, Zoila Shutter ,RN Clinical Documentation Specialist:  [facility]

## 2014-05-07 NOTE — Progress Notes (Signed)
Pt CBG 67.  Pt asymptomatic.  Alert and oriented.  Skin dry.  Breakfast on floor, advised pt to eat breakfast and will re-check CBG.

## 2014-05-07 NOTE — Evaluation (Signed)
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   Clinical Impression   This 59 yo female admitted with above presents to acute OT with decreased endurance and decreased mobility all affecting her ability to do as much for herself as pta. She will benefit from acute OT with follow up [place] to get back to PLOF.   Follow Up Recommendations  Home health OT    Equipment Recommendations  3 in 1 bedside comode       Precautions / Restrictions Precautions Precautions: Fall Restrictions Weight Bearing Restrictions: No      Mobility Bed Mobility Overal bed mobility: Needs Assistance Bed Mobility: Supine to Sit     Supine to sit: Supervision     General bed mobility comments: Supervision for safety. Increased time to complete full scoot to edge of bed.      Balance Overall balance assessment: Needs assistance Sitting-balance support: Feet supported;No upper extremity supported Sitting balance-Leahy Scale: Fair                                      ADL Overall ADL's : Needs assistance/impaired Eating/Feeding: Independent;Sitting   Grooming: Set up;Sitting   Upper Body Bathing: Set up;Sitting   Lower Body Bathing: Total assistance (with min A sit<>stand)   Upper Body Dressing : Minimal assistance;Sitting   Lower Body Dressing: Total assistance (with min A sit<>stand)                                 Pertinent Vitals/Pain Pain Assessment: No/denies pain     Hand Dominance Right   Extremity/Trunk Assessment Upper Extremity Assessment Upper Extremity Assessment: Generalized weakness           Communication Communication Communication: No difficulties   Cognition  Arousal/Alertness: Awake/alert Behavior During Therapy: Flat affect Overall Cognitive Status: Within Functional Limits for tasks assessed                                Home Living Family/patient expects to be discharged to:: Private residence Living Arrangements: Non-relatives/Friends Available Help at Discharge: Family;Available 24 hours/day (and has a PCA who A her with bathing and some dressing) Type of Home: House Home Access: Stairs to enter [employer] of Steps: 3 Entrance Stairs-Rails: Right;Left;Can reach both Home Layout: One level     Bathroom Shower/Tub: Occupational psychologist: Standard     Home Equipment: Environmental consultant - 2 wheels (02 at 2 liters)          Prior Functioning/Environment Level of Independence: Needs assistance  Gait / Transfers Assistance Needed: Used walker occasionally. ADL's / Homemaking Assistance Needed: Pt reports her aid A her getting into and out of shower as well as bathing; aid also A with socks and sometimes underwear        OT Diagnosis: Generalized weakness   OT Problem List: Decreased strength;Decreased activity tolerance;Impaired balance (sitting and/or standing);Cardiopulmonary status limiting activity   OT Treatment/Interventions: Self-care/ADL training;Balance training;Energy conservation;DME and/or AE instruction;Patient/family education;Therapeutic activities    OT Goals(Current goals can be  found in the care plan section) Acute Rehab OT Goals Patient Stated Goal: home OT Goal Formulation: With patient Time For Goal Achievement: 05/21/14 Potential to Achieve Goals: Good  OT Frequency: Min 2X/week              End of Session Equipment Utilized During Treatment: Oxygen (2 liters)  Activity Tolerance: Patient limited by fatigue Patient left: in bed;with call bell/phone within reach;with bed alarm set   Time: 8185-6314 OT Time Calculation (min): 11 min Charges:  OT General Charges $OT  Visit: 1 Procedure OT Evaluation $Initial OT Evaluation Tier I: 1 Procedure  Almon Register [phone] 05/07/2014, 4:48 PM

## 2014-05-07 NOTE — Care Management Note (Signed)
CARE MANAGEMENT NOTE 05/07/2014  Patient:  Samantha Mcmillan, Samantha Mcmillan   Account Number:  [id number]  Date Initiated:  05/07/2014  Documentation initiated by:  Indianna Boran  Subjective/Objective Assessment:   CM following for progression and d/c planning.     Action/Plan:   Anticipated DC Date:  05/09/2014   Anticipated DC Plan:  HOME/SELF CARE         Choice offered to / List presented to:             Status of service:   Medicare Important Message given?  YES (If response is "NO", the following Medicare IM given date fields will be blank) Date Medicare IM given:  05/07/2014 Medicare IM given by:  Sabin Gibeault Date Additional Medicare IM given:   Additional Medicare IM given by:    Discharge Disposition:    Per UR Regulation:    If discussed at Long Length of Stay Meetings, dates discussed:    Comments:

## 2014-05-07 NOTE — Progress Notes (Signed)
Pt CBG 55 paged Dr. Tollie Pizza, pt eating dinner and will re-check.

## 2014-05-07 NOTE — Progress Notes (Signed)
Pt CBG 132 re-check

## 2014-05-08 LAB — GLUCOSE, CAPILLARY
GLUCOSE-CAPILLARY: 93 mg/dL (ref 70–99)
Glucose-Capillary: 76 mg/dL (ref 70–99)
Glucose-Capillary: 112 mg/dL — ABNORMAL HIGH (ref 70–99)
Glucose-Capillary: 101 mg/dL — ABNORMAL HIGH (ref 70–99)

## 2014-05-08 MED ORDER — ACETAMINOPHEN 325 MG PO TABS: 650.0000 mg | ORAL_TABLET | Freq: Four times a day (QID) | ORAL | Status: DC | PRN

## 2014-05-08 MED ORDER — CEFUROXIME AXETIL 500 MG PO TABS
500.0000 mg | ORAL_TABLET | Freq: Two times a day (BID) | ORAL | Status: DC
  Administered 2014-05-08 – 2014-05-09 (×2): 500 mg via ORAL
  Filled 2014-05-08 (×4): qty 1

## 2014-05-08 MED ORDER — AZITHROMYCIN 500 MG PO TABS
500.0000 mg | ORAL_TABLET | Freq: Every day | ORAL | Status: DC
  Administered 2014-05-08 – 2014-05-09 (×2): 500 mg via ORAL
  Filled 2014-05-08 (×2): qty 1

## 2014-05-08 MED ORDER — ONDANSETRON HCL 4 MG/2ML IJ SOLN: 4.0000 mg | Freq: Four times a day (QID) | INTRAMUSCULAR | Status: DC | PRN

## 2014-05-08 NOTE — Progress Notes (Signed)
PROGRESS NOTE  Samantha Mcmillan:[id number] DOB: 30-Mar-1955 DOA: 05/04/2014 PCP: Casilda Carls, MD  Assessment/Plan: PNA- HCAP- vomiting improved. Change zofran to PRN.  Change abx to ceftin and azithro and home tomorrow if stable  UTI, e coli, pansensitive.  Sensitive to cephalosporin  Nausea vomiting.  Likely related to PO levaquin.  Improved see above.   COPD- nebs/O2. Still smoking. Counseled against  Chronic hypoxia: on home oxygen  Hypotension- resolved  HTN/HLD- home meds  AKI- improving  Cirrhosis secondary to NAFLD  DM with hypoglycemia. Hold po med and hs coverage. SSI only for now  Hypothyroid -synthroid  Weakness Home health  Acute urinary retention Voiding trial  Code Status: full Family Communication: ex husband at bedside Disposition Plan:    Consultants: PCCM admit  Procedures:    HPI/Subjective: Vomiting resolved. Ate breakfast  Objective: Filed Vitals:   05/08/14 0912  BP:   Pulse: 95  Temp:   Resp: 20    Intake/Output Summary (Last 24 hours) at 05/08/14 1013 Last data filed at 05/08/14 0649  Gross per 24 hour  Intake    480 ml  Output   1550 ml  Net  -1070 ml   Filed Weights   05/05/14 2044 05/06/14 2031 05/07/14 2129  Weight: 88.497 kg (195 lb 1.6 oz) 88.678 kg (195 lb 8 oz) 88.498 kg (195 lb 1.6 oz)    Exam:   General:  In chair. A and o. Breathing nonlabored  Cardiovascular: rrr without mgr  Respiratory: cta without wrr  Abdomen: +BS, s, nt, nd  Musculoskeletal: no edema    Data Reviewed: Basic Metabolic Panel:  Recent Labs Lab 05/04/14 1140 05/05/14 0545 05/06/14 0039 05/07/14 0601  NA 132* 134* 131* 132*  K 4.8 4.8 4.6 4.6  CL 91* 98 98 99  CO2 33* 28 30 29   GLUCOSE 160* 78 132* 53*  BUN 24* 22 15 9   CREATININE 1.57* 1.22* 1.07 0.86  CALCIUM 8.2* 8.0* 8.0* 8.4  MG  --  2.1  --   --   PHOS  --  3.3  --   --    Liver Function Tests:  Recent Labs Lab 05/04/14 1825  AST 38*  ALT 17   ALKPHOS 198*  BILITOT 0.5  PROT 5.3*  ALBUMIN 2.2*   No results for input(s): LIPASE, AMYLASE in the last 168 hours.  Recent Labs Lab 05/04/14 1219  AMMONIA 20   CBC:  Recent Labs Lab 05/04/14 1140 05/05/14 0545 05/06/14 0039 05/07/14 0601  WBC 5.6 3.1* 3.7* 3.8*  NEUTROABS 3.7  --   --   --   HGB 10.1* 8.5* 8.5* 8.9*  HCT 30.7* 25.9* 25.6* 27.1*  MCV 97.5 95.9 95.2 94.4  PLT 110* 87* 80* 95*   Cardiac Enzymes: No results for input(s): CKTOTAL, CKMB, CKMBINDEX, TROPONINI in the last 168 hours. BNP (last 3 results)  Recent Labs  05/04/14 1140  BNP 210.9*    ProBNP (last 3 results) No results for input(s): PROBNP in the last 8760 hours.  CBG:  Recent Labs Lab 05/07/14 1647 05/07/14 1720 05/07/14 1759 05/07/14 2128 05/08/14 0756  GLUCAP 52* 55* 132* 95 76    Recent Results (from the past 240 hour(s))  Blood culture (routine x 2)     Status: None (Preliminary result)   Collection Time: 05/04/14 11:40 AM  Result Value Ref Range Status   Specimen Description BLOOD RIGHT WRIST  Final   Special Requests BOTTLES DRAWN AEROBIC AND ANAEROBIC 5MLS  Final  Culture   Final           BLOOD CULTURE RECEIVED NO GROWTH TO DATE CULTURE WILL BE HELD FOR 5 DAYS BEFORE ISSUING A FINAL NEGATIVE REPORT Performed at [facility]    Report Status PENDING  Incomplete  Blood culture (routine x 2)     Status: None (Preliminary result)   Collection Time: 05/04/14 12:19 PM  Result Value Ref Range Status   Specimen Description BLOOD BLOOD RIGHT FOREARM  Final   Special Requests BOTTLES DRAWN AEROBIC AND ANAEROBIC 5CC  Final   Culture   Final           BLOOD CULTURE RECEIVED NO GROWTH TO DATE CULTURE WILL BE HELD FOR 5 DAYS BEFORE ISSUING A FINAL NEGATIVE REPORT Performed at [facility]    Report Status PENDING  Incomplete  Urine culture     Status: None   Collection Time: 05/04/14  1:37 PM  Result Value Ref Range Status   Specimen Description URINE,  RANDOM  Final   Special Requests Normal  Final   Colony Count   Final    >=100,000 COLONIES/ML Performed at [facility]    Culture   Final    ESCHERICHIA COLI Performed at [facility]    Report Status 05/07/2014 FINAL  Final   Organism ID, Bacteria ESCHERICHIA COLI  Final      Susceptibility   Escherichia coli - MIC*    AMPICILLIN <=2 SENSITIVE Sensitive     CEFAZOLIN <=4 SENSITIVE Sensitive     CEFTRIAXONE <=1 SENSITIVE Sensitive     CIPROFLOXACIN <=0.25 SENSITIVE Sensitive     GENTAMICIN <=1 SENSITIVE Sensitive     LEVOFLOXACIN <=0.12 SENSITIVE Sensitive     NITROFURANTOIN <=16 SENSITIVE Sensitive     TOBRAMYCIN <=1 SENSITIVE Sensitive     TRIMETH/SULFA <=20 SENSITIVE Sensitive     PIP/TAZO <=4 SENSITIVE Sensitive     * ESCHERICHIA COLI     Studies: No results found.  Scheduled Meds: . antiseptic oral rinse  7 mL Mouth Rinse BID  . ARIPiprazole  5 mg Oral Daily  . budesonide-formoterol  2 puff Inhalation BID  . enoxaparin (LOVENOX) injection  40 mg Subcutaneous Q24H  . gabapentin  300 mg Oral QHS  . insulin aspart  0-9 Units Subcutaneous TID WC  . ipratropium-albuterol  3 mL Inhalation BID  . lamoTRIgine  150 mg Oral QHS  . levofloxacin (LEVAQUIN) IV  500 mg Intravenous Q24H  . levothyroxine  75 mcg Oral QAC breakfast  . metoprolol tartrate  12.5 mg Oral BID  . montelukast  10 mg Oral QHS  . ondansetron (ZOFRAN) IV  4 mg Intravenous TID AC  . pantoprazole  40 mg Oral Daily  . sertraline  100 mg Oral Daily  . spironolactone  50 mg Oral Daily   Continuous Infusions:   Antibiotics Given (last 72 hours)    Date/Time Action Medication Dose Rate   05/05/14 1330 Given   ceFEPIme (MAXIPIME) 2 g in dextrose 5 % 50 mL IVPB 2 g 100 mL/hr   05/05/14 1728 Given   vancomycin (VANCOCIN) IVPB 750 mg/150 ml premix 750 mg 150 mL/hr   05/06/14 0549 Given   vancomycin (VANCOCIN) IVPB 750 mg/150 ml premix 750 mg 150 mL/hr   05/06/14 1252 Given   levofloxacin  (LEVAQUIN) tablet 750 mg 750 mg    05/07/14 1020 Given   levofloxacin (LEVAQUIN) tablet 750 mg 750 mg       Time  spent: 25 min  [facility] [email or web address], password [facility] 05/08/2014, 10:13 AM  LOS: 4 days

## 2014-05-09 DIAGNOSIS — E119 Type 2 diabetes mellitus without complications: Secondary | ICD-10-CM

## 2014-05-09 DIAGNOSIS — J189 Pneumonia, unspecified organism: Secondary | ICD-10-CM | POA: Diagnosis present

## 2014-05-09 DIAGNOSIS — R339 Retention of urine, unspecified: Secondary | ICD-10-CM | POA: Diagnosis present

## 2014-05-09 LAB — GLUCOSE, CAPILLARY
GLUCOSE-CAPILLARY: 96 mg/dL (ref 70–99)
Glucose-Capillary: 180 mg/dL — ABNORMAL HIGH (ref 70–99)

## 2014-05-09 MED ORDER — SPIRONOLACTONE 25 MG PO TABS: 25.0000 mg | ORAL_TABLET | Freq: Every day | ORAL | Status: DC

## 2014-05-09 MED ORDER — BUMETANIDE 1 MG PO TABS: ORAL_TABLET | ORAL | Status: DC

## 2014-05-09 MED ORDER — SAXAGLIPTIN-METFORMIN ER 2.5-1000 MG PO TB24: 1.0000 | ORAL_TABLET | Freq: Two times a day (BID) | ORAL | Status: DC

## 2014-05-09 MED ORDER — PROMETHAZINE HCL 12.5 MG PO TABS: 12.5000 mg | ORAL_TABLET | Freq: Four times a day (QID) | ORAL | Status: DC | PRN

## 2014-05-09 MED ORDER — CEFUROXIME AXETIL 500 MG PO TABS: 500.0000 mg | ORAL_TABLET | Freq: Two times a day (BID) | ORAL | Status: DC

## 2014-05-09 MED ORDER — AZITHROMYCIN 500 MG PO TABS
500.0000 mg | ORAL_TABLET | Freq: Every day | ORAL | Status: DC
Start: 2014-05-10 — End: 2014-05-18

## 2014-05-09 NOTE — Patient Instructions (Signed)
Hip Flexion / Knee Extension: Straight-Leg Raise (Eccentric) Mini Squat: Double Leg   With feet shoulder width apart, reach forward for balance and do a mini squat. Keep knees in line with second toe. Knees do not go past toes. Repeat ___ times per set. Rest ___ seconds after set. Do ___ sets per session.  http://plyo.exer.us/70   Copyright  VHI. All rights reserved.    Lie on back. Lift leg with knee straight. Slowly lower leg for 3-5 seconds. ___ reps per set, ___ sets per day, ___ days per week. Lower like elevator, stopping at each floor. Add ___ lbs when you achieve ___ repetitions. Rest on elbows. Rest on straight arms.  Copyright  VHI. All rights reserved.  ABDUCTION: Supine (Active)   Lie on back, legs straight. Draw right leg out to the side as far as possible. Use ___ lbs. Complete ___ sets of ___ repetitions. Perform ___ sessions per day.  Copyright  VHI. All rights reserved.  ADDUCTION: Isometric   With ball between knees, squeeze them inward. Hold ___ seconds. Complete ___ sets of ___ repetitions. Perform ___ sessions per day.  http://gtsc.exer.us/125   Copyright  VHI. All rights reserved.  KNEE: Extension, Long Arc Quads - Sitting   Raise leg until knee is straight. ___ reps per set, ___ sets per day, ___ days per week  Copyright  VHI. All rights reserved.  Quad Sets   Squeeze pelvic floor and hold. Tighten top of left thigh. Hold for ___ seconds. Relax for ___ seconds. Repeat ___ times. Do ___ times a day. Repeat with other leg.    Copyright  VHI. All rights reserved.  Quad Sets   Squeeze pelvic floor and hold. Tighten top of left thigh. Hold for ___ seconds. Relax for ___ seconds. Repeat ___ times. Do ___ times a day. Repeat with other leg.    Copyright  VHI. All rights reserved.

## 2014-05-09 NOTE — Discharge Summary (Signed)
Physician Discharge Summary  Samantha Mcmillan QXI:[id number] DOB: 1955/06/17 DOA: 05/04/2014  PCP: Casilda Carls, MD  Admit date: 05/04/2014 Discharge date: 05/09/2014  Time spent: greater than 30 minutes  Recommendations for Outpatient Follow-up:  1.   Discharge Diagnoses:  Active Problems:   Hypotension   UTI (urinary tract infection)   AKI (acute kidney injury)   Hypothyroidism   Nausea with vomiting   HCAP (healthcare-associated pneumonia)   Urinary retention   DM2 (diabetes mellitus, type 2)   Discharge Condition: stable  Diet recommendation: diabetic  Filed Weights   05/06/14 2031 05/07/14 2129 05/08/14 2115  Weight: 88.678 kg (195 lb 8 oz) 88.498 kg (195 lb 1.6 oz) 90.03 kg (198 lb 7.7 oz)    History of present illness:  59 y/o F w/ PMHx of DM type II, HTN, COPD, chronic back pain, and cirrhosis 2/2 NAFLD, presented to the ED after her significant other found her difficult to arouse this AM. Per patient and significant other, patient had hard time getting out of bed this morning, had hard time sitting up, holding objects, pronunciation, etc. Per chart review, EMS was called, found patient to have low BP, started on Epi gtt in the field which was stopped prior to ED arrival. Patient states she has not been feeling that well recently, has chronic cough and subjective fevers at home. Denies SOB, chest pain, or dysuria. Does endorse some mild abdominal pain.  Diagnosed with HCAP and UTI  Hospital Course:  Initially admitted to ICU under PCCM. Hypotension responded to fluids and empiric antibiotics. Transferred to hospitalists following day.  PNA- HCAP- initially started on broad spectrum antibiotics. Narrowed to levaquin, which caused intractable nausea and vomiting. Switched to ceftin and azithro with improvement in GI side effects.   UTI, e coli, pansensitive. Sensitive to cephalosporin  Nausea vomiting. Likely related to PO levaquin. Improved see above.   COPD-  nebs/O2. Still smoking. Counseled against  Chronic hypoxia: on home oxygen  Hypotension- resolved  AKI- on admission. At discharge, creatinine 0.8  DM type 2. Developed hypoglycemia which improved once given only SSI.  Weakness Home health arranged  devepoped Acute urinary retention necessitating foley catheter. It has been removed and patient voiding fine   Procedures:  none  Consultations:  PCCM  Discharge Exam: Filed Vitals:   05/09/14 0930  BP: 149/64  Pulse: 89  Temp: 98.5 F (36.9 C)  Resp: 18    General: comfortable Cardiovascular: RRR Respiratory: CTA  Discharge Instructions   Discharge Instructions    Diet - low sodium heart healthy    Complete by:  As directed      Diet Carb Modified    Complete by:  As directed      Discharge instructions    Complete by:  As directed   Hold diabetes medicine until glucose greater than 150     Increase activity slowly    Complete by:  As directed           Current Discharge Medication List    START taking these medications   Details  azithromycin (ZITHROMAX) 500 MG tablet Take 1 tablet (500 mg total) by mouth daily. Qty: 2 tablet, Refills: 0    cefUROXime (CEFTIN) 500 MG tablet Take 1 tablet (500 mg total) by mouth 2 (two) times daily with a meal. Qty: 5 tablet, Refills: 0    promethazine (PHENERGAN) 12.5 MG tablet Take 1 tablet (12.5 mg total) by mouth every 6 (six) hours as needed for nausea or vomiting.  Qty: 10 tablet, Refills: 0      CONTINUE these medications which have CHANGED   Details  bumetanide (BUMEX) 1 MG tablet 1 tablet daily or as directed. Qty: 30 tablet, Refills: 0    Saxagliptin-Metformin 2.06-998 MG TB24 Take 1 tablet by mouth 2 (two) times daily. Hold until blood glucose greater than 150 Qty: 30 tablet    spironolactone (ALDACTONE) 25 MG tablet Take 1 tablet (25 mg total) by mouth daily. Qty: 30 tablet, Refills: 0      CONTINUE these medications which have NOT CHANGED    Details  albuterol-ipratropium (COMBIVENT) 18-103 MCG/ACT inhaler Inhale 2 puffs into the lungs 2 (two) times daily.    allopurinol (ZYLOPRIM) 100 MG tablet Take 100 mg by mouth 2 (two) times daily.     ARIPiprazole (ABILIFY) 5 MG tablet Take 5 mg by mouth daily.     atorvastatin (LIPITOR) 10 MG tablet Take 10 mg by mouth daily at 6 PM.     budesonide-formoterol (SYMBICORT) 80-4.5 MCG/ACT inhaler Inhale 2 puffs into the lungs 2 (two) times daily as needed (shortness of breath).     buprenorphine (BUTRANS - DOSED MCG/HR) 10 MCG/HR PTWK patch Place 10 mcg onto the skin once a week.    clonazePAM (KLONOPIN) 0.5 MG tablet Take 0.5 mg by mouth daily.    cyclobenzaprine (FLEXERIL) 10 MG tablet Take 10 mg by mouth 3 (three) times daily as needed for muscle spasms.     gabapentin (NEURONTIN) 300 MG capsule Take 300 mg by mouth at bedtime.    HYDROmorphone (DILAUDID) 2 MG tablet Take 2 mg by mouth every 6 (six) hours as needed for severe pain.    lamoTRIgine (LAMICTAL) 150 MG tablet Take 150 mg by mouth at bedtime.     levothyroxine (SYNTHROID, LEVOTHROID) 75 MCG tablet Take 75 mcg by mouth daily.     lisinopril (PRINIVIL,ZESTRIL) 20 MG tablet Take 10 mg by mouth 2 (two) times daily.     magnesium oxide (MAG-OX) 400 MG tablet Take 400 mg by mouth 2 (two) times daily.    metoprolol tartrate (LOPRESSOR) 25 MG tablet Take 25 mg by mouth 2 (two) times daily.    montelukast (SINGULAIR) 10 MG tablet Take 10 mg by mouth at bedtime.    pantoprazole (PROTONIX) 40 MG tablet Take 40 mg by mouth daily.    potassium chloride (K-DUR) 10 MEQ tablet Take 10 mEq by mouth daily.    sertraline (ZOLOFT) 100 MG tablet Take 100 mg by mouth daily.    Pramoxine-HC (HYDROCORTISONE ACE-PRAMOXINE) 2.5-1 % CREA Apply 1 application topically 2 (two) times daily. Qty: 1 Tube, Refills: 0   Associated Diagnoses: Hemorrhoids, unspecified hemorrhoid type    Vitamin D, Ergocalciferol, (DRISDOL) 50000 UNITS CAPS  capsule Take 50,000 Units by mouth every 7 (seven) days.       Allergies  Allergen Reactions  . Iodine Shortness Of Breath  . Latex Anaphylaxis  . Oxycodone Shortness Of Breath  . Percocet [Oxycodone-Acetaminophen] Shortness Of Breath  . Shellfish Allergy Anaphylaxis  . Amitriptyline Other (See Comments)    Mental changes  . Wellbutrin [Bupropion] Other (See Comments)    Mental changes  . Augmentin [Amoxicillin-Pot Clavulanate] Rash  . Codeine Rash  . Cortisone Rash  . Diphenhydramine Rash  . Other Rash    darvocet  . Vicodin [Hydrocodone-Acetaminophen] Rash    dizzy   Follow-up Information    Follow up with [facility], MD In 1 week.   Specialty:  Internal Medicine  Contact information:   [place] [id number] [phone]        The results of significant diagnostics from this hospitalization (including imaging, microbiology, ancillary and laboratory) are listed below for reference.    Significant Diagnostic Studies: Dg Chest Port 1 View  05/04/2014   CLINICAL DATA:  Chest pain and hypotension.  EXAM: PORTABLE CHEST - 1 VIEW  COMPARISON:  03/15/2014  FINDINGS: Heart size is moderately enlarged. There is no pleural effusion identified. The lung volumes are low. Bilateral interstitial and airspace opacities are identified.  IMPRESSION: 1. Bilateral interstitial and airspace opacities may reflect pulmonary edema or multifocal infection.   Electronically Signed   By: Kerby Moors M.D.   On: 05/04/2014 12:19    Microbiology: Recent Results (from the past 240 hour(s))  Blood culture (routine x 2)     Status: None (Preliminary result)   Collection Time: 05/04/14 11:40 AM  Result Value Ref Range Status   Specimen Description BLOOD RIGHT WRIST  Final   Special Requests BOTTLES DRAWN AEROBIC AND ANAEROBIC 5MLS  Final   Culture   Final           BLOOD CULTURE RECEIVED NO GROWTH TO DATE CULTURE WILL BE HELD FOR 5 DAYS BEFORE ISSUING A FINAL NEGATIVE  REPORT Performed at [facility]    Report Status PENDING  Incomplete  Blood culture (routine x 2)     Status: None (Preliminary result)   Collection Time: 05/04/14 12:19 PM  Result Value Ref Range Status   Specimen Description BLOOD BLOOD RIGHT FOREARM  Final   Special Requests BOTTLES DRAWN AEROBIC AND ANAEROBIC 5CC  Final   Culture   Final           BLOOD CULTURE RECEIVED NO GROWTH TO DATE CULTURE WILL BE HELD FOR 5 DAYS BEFORE ISSUING A FINAL NEGATIVE REPORT Performed at [facility]    Report Status PENDING  Incomplete  Urine culture     Status: None   Collection Time: 05/04/14  1:37 PM  Result Value Ref Range Status   Specimen Description URINE, RANDOM  Final   Special Requests Normal  Final   Colony Count   Final    >=100,000 COLONIES/ML Performed at [facility]    Culture   Final    ESCHERICHIA COLI Performed at [facility]    Report Status 05/07/2014 FINAL  Final   Organism ID, Bacteria ESCHERICHIA COLI  Final      Susceptibility   Escherichia coli - MIC*    AMPICILLIN <=2 SENSITIVE Sensitive     CEFAZOLIN <=4 SENSITIVE Sensitive     CEFTRIAXONE <=1 SENSITIVE Sensitive     CIPROFLOXACIN <=0.25 SENSITIVE Sensitive     GENTAMICIN <=1 SENSITIVE Sensitive     LEVOFLOXACIN <=0.12 SENSITIVE Sensitive     NITROFURANTOIN <=16 SENSITIVE Sensitive     TOBRAMYCIN <=1 SENSITIVE Sensitive     TRIMETH/SULFA <=20 SENSITIVE Sensitive     PIP/TAZO <=4 SENSITIVE Sensitive     * ESCHERICHIA COLI     Labs: Basic Metabolic Panel:  Recent Labs Lab 05/04/14 1140 05/05/14 0545 05/06/14 0039 05/07/14 0601  NA 132* 134* 131* 132*  K 4.8 4.8 4.6 4.6  CL 91* 98 98 99  CO2 33* 28 30 29   GLUCOSE 160* 78 132* 53*  BUN 24* 22 15 9   CREATININE 1.57* 1.22* 1.07 0.86  CALCIUM 8.2* 8.0* 8.0* 8.4  MG  --  2.1  --   --  PHOS  --  3.3  --   --    Liver Function Tests:  Recent Labs Lab 05/04/14 1825  AST 38*  ALT 17  ALKPHOS 198*  BILITOT  0.5  PROT 5.3*  ALBUMIN 2.2*   No results for input(s): LIPASE, AMYLASE in the last 168 hours.  Recent Labs Lab 05/04/14 1219  AMMONIA 20   CBC:  Recent Labs Lab 05/04/14 1140 05/05/14 0545 05/06/14 0039 05/07/14 0601  WBC 5.6 3.1* 3.7* 3.8*  NEUTROABS 3.7  --   --   --   HGB 10.1* 8.5* 8.5* 8.9*  HCT 30.7* 25.9* 25.6* 27.1*  MCV 97.5 95.9 95.2 94.4  PLT 110* 87* 80* 95*   Cardiac Enzymes: No results for input(s): CKTOTAL, CKMB, CKMBINDEX, TROPONINI in the last 168 hours. BNP: BNP (last 3 results)  Recent Labs  05/04/14 1140  BNP 210.9*    ProBNP (last 3 results) No results for input(s): PROBNP in the last 8760 hours.  CBG:  Recent Labs Lab 05/08/14 0756 05/08/14 1201 05/08/14 1636 05/08/14 2112 05/09/14 0759  GLUCAP 76 112* 93 101* 96       Signed:  Zaydee Aina L  [facility] 05/09/2014, 11:41 AM

## 2014-05-09 NOTE — Progress Notes (Signed)
Samantha Mcmillan to be D/C'd Home per MD order.  Discussed prescriptions and follow up appointments with the patient. Prescriptions given to patient, medication list explained in detail. Pt verbalized understanding.    Medication List    TAKE these medications        albuterol-ipratropium 18-103 MCG/ACT inhaler  Commonly known as:  COMBIVENT  Inhale 2 puffs into the lungs 2 (two) times daily.     allopurinol 100 MG tablet  Commonly known as:  ZYLOPRIM  Take 100 mg by mouth 2 (two) times daily.     ARIPiprazole 5 MG tablet  Commonly known as:  ABILIFY  Take 5 mg by mouth daily.     atorvastatin 10 MG tablet  Commonly known as:  LIPITOR  Take 10 mg by mouth daily at 6 PM.     azithromycin 500 MG tablet  Commonly known as:  ZITHROMAX  Take 1 tablet (500 mg total) by mouth daily.  Start taking on:  05/10/2014     budesonide-formoterol 80-4.5 MCG/ACT inhaler  Commonly known as:  SYMBICORT  Inhale 2 puffs into the lungs 2 (two) times daily as needed (shortness of breath).     bumetanide 1 MG tablet  Commonly known as:  BUMEX  1 tablet daily or as directed.     buprenorphine 10 MCG/HR Ptwk patch  Commonly known as:  BUTRANS - dosed mcg/hr  Place 10 mcg onto the skin once a week.     cefUROXime 500 MG tablet  Commonly known as:  CEFTIN  Take 1 tablet (500 mg total) by mouth 2 (two) times daily with a meal.     clonazePAM 0.5 MG tablet  Commonly known as:  KLONOPIN  Take 0.5 mg by mouth daily.     cyclobenzaprine 10 MG tablet  Commonly known as:  FLEXERIL  Take 10 mg by mouth 3 (three) times daily as needed for muscle spasms.     gabapentin 300 MG capsule  Commonly known as:  NEURONTIN  Take 300 mg by mouth at bedtime.     Hydrocortisone Ace-Pramoxine 2.5-1 % Crea  Apply 1 application topically 2 (two) times daily.     HYDROmorphone 2 MG tablet  Commonly known as:  DILAUDID  Take 2 mg by mouth every 6 (six) hours as needed for severe pain.     lamoTRIgine 150 MG  tablet  Commonly known as:  LAMICTAL  Take 150 mg by mouth at bedtime.     levothyroxine 75 MCG tablet  Commonly known as:  SYNTHROID, LEVOTHROID  Take 75 mcg by mouth daily.     lisinopril 20 MG tablet  Commonly known as:  PRINIVIL,ZESTRIL  Take 10 mg by mouth 2 (two) times daily.     magnesium oxide 400 MG tablet  Commonly known as:  MAG-OX  Take 400 mg by mouth 2 (two) times daily.     metoprolol tartrate 25 MG tablet  Commonly known as:  LOPRESSOR  Take 25 mg by mouth 2 (two) times daily.     montelukast 10 MG tablet  Commonly known as:  SINGULAIR  Take 10 mg by mouth at bedtime.     pantoprazole 40 MG tablet  Commonly known as:  PROTONIX  Take 40 mg by mouth daily.     potassium chloride 10 MEQ tablet  Commonly known as:  K-DUR  Take 10 mEq by mouth daily.     promethazine 12.5 MG tablet  Commonly known as:  PHENERGAN  Take 1 tablet (  12.5 mg total) by mouth every 6 (six) hours as needed for nausea or vomiting.     Saxagliptin-Metformin 2.06-998 MG Tb24  Take 1 tablet by mouth 2 (two) times daily. Hold until blood glucose greater than 150     sertraline 100 MG tablet  Commonly known as:  ZOLOFT  Take 100 mg by mouth daily.     spironolactone 25 MG tablet  Commonly known as:  ALDACTONE  Take 1 tablet (25 mg total) by mouth daily.     Vitamin D (Ergocalciferol) 50000 UNITS Caps capsule  Commonly known as:  DRISDOL  Take 50,000 Units by mouth every 7 (seven) days.        Filed Vitals:   05/09/14 0930  BP: 149/64  Pulse: 89  Temp: 98.5 F (36.9 C)  Resp: 18    Skin clean, dry and intact without evidence of skin break down, no evidence of skin tears noted. IV catheter discontinued intact. Site without signs and symptoms of complications. Dressing and pressure applied. Pt denies pain at this time. No complaints noted.  An After Visit Summary was printed and given to the patient. Patient escorted via [place], and D/C home via private auto.  Samantha Mcmillan  A 05/09/2014 1:26 PM

## 2014-05-09 NOTE — Care Management Note (Signed)
CARE MANAGEMENT NOTE 05/09/2014  Patient:  Samantha Mcmillan, Samantha Mcmillan   Account Number:  [id number]  Date Initiated:  05/07/2014  Documentation initiated by:  Emile Ringgenberg  Subjective/Objective Assessment:   CM following for progression and d/c planning.     Action/Plan:   05/09/2014 Met with pt and husband , pt active with [facility] [place] and [place] aide. They wish those services to be continued, orders faxed to [facility] and call placed to resume services.   Anticipated DC Date:  05/09/2014   Anticipated DC Plan:  [place]         Choice offered to / List presented to:          [facility] arranged  HH-1 RN  [place] agency  [place]   Status of service:  Completed, signed off Medicare Important Message given?  YES (If response is "NO", the following Medicare IM given date fields will be blank) Date Medicare IM given:  05/07/2014 Medicare IM given by:  Massimiliano Rohleder Date Additional Medicare IM given:   Additional Medicare IM given by:    Discharge Disposition:    Per UR Regulation:    If discussed at Long Length of Stay Meetings, dates discussed:    Comments:

## 2014-05-09 NOTE — Progress Notes (Signed)
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   OT comments  Pt with improved endurance and ability to perform BADLs.  She will have 24 hour supervision, and is eager to discharge home.   Follow Up Recommendations  Home health OT    Equipment Recommendations  3 in 1 bedside comode    Recommendations for Other Services      Precautions / Restrictions Precautions Precautions: Fall Restrictions Weight Bearing Restrictions: No       Mobility Bed Mobility                  Transfers Overall transfer level: Needs assistance Equipment used: Rolling walker (2 wheeled) Transfers: Sit to/from Stand;Stand Pivot Transfers Sit to Stand: Min guard Stand pivot transfers: Min guard            Balance Overall balance assessment: Needs assistance Sitting-balance support: Feet supported Sitting balance-Leahy Scale: Good     Standing balance support: During functional activity Standing balance-Leahy Scale: Fair                     ADL Overall ADL's : Needs assistance/impaired Eating/Feeding: Independent;Sitting   Grooming: Wash/dry hands;Wash/dry face;Oral care;Brushing hair;Supervision/safety;Standing   Upper Body Bathing: Set up;Sitting   Lower Body Bathing: Sit to/from stand;Min guard   Upper Body Dressing : Set up;Sitting   Lower Body Dressing: Sit to/from stand;Min guard   Toilet Transfer: Ambulation;Comfort height toilet;Min guard   Toileting- Clothing Manipulation and Hygiene: Sit to/from stand;Min Child psychotherapist Details (indicate cue type and reason): discussed options for tub seats  Functional mobility during ADLs: Supervision/safety General ADL Comments: Pt fatigues quickly  with activity requiring multiple rest breaks with ADL tasks.  02 sats 97% on 3.5L supplemental 02. HR 103      Vision                     Perception     Praxis      Cognition   Behavior During Therapy: WFL for tasks assessed/performed Overall Cognitive Status: Within Functional Limits for tasks assessed                       Extremity/Trunk Assessment               Exercises     Shoulder Instructions       General Comments      Pertinent Vitals/ Pain       Pain Assessment: No/denies pain  Home Living                                          Prior Functioning/Environment              Frequency Min 2X/week     Progress Toward Goals  OT Goals(current goals can now be found in the care plan section)     ADL Goals Pt Will Perform Grooming: with set-up;sitting (EOB 2 tasks) Pt Will Transfer to Toilet: with min guard assist;ambulating;bedside commode (over toilet) Pt Will Perform Toileting - Clothing Manipulation and hygiene: with min assist;sit to/from stand  Additional ADL Goal #1: Pt will be aware of energy conservation strategies from handout that will benefit her  Plan Discharge plan remains appropriate    Co-evaluation                 End of Session Equipment Utilized During Treatment: Oxygen;Rolling walker   Activity Tolerance Patient limited by fatigue   Patient Left in chair;with call bell/phone within reach   Nurse Communication Mobility status        Time: 1137-1200 OT Time Calculation (min): 23 min  Charges: OT General Charges $OT Visit: 1 Procedure OT Treatments $Self Care/Home Management : 23-37 mins  Mariko Nowakowski M 05/09/2014, 2:22 PM

## 2014-05-09 NOTE — Progress Notes (Signed)
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    PT Comments    Pt progressing towards physical therapy goals. Was able to ambulate in room with RW and min guard assist. Per RN, staff has been using [employer] for transfers overnight, however I feel that pt is doing well enough to transfer without the use of equipment, and therapist encouraged pt to walk into bathroom here and at home (with supervision) for increased activity. HEP handout given and pt educated on technique/frequency/reps. Will continue to follow and progress as able per POC.   Follow Up Recommendations  Home health PT;Supervision/Assistance - 24 hour     Equipment Recommendations  None recommended by PT    Recommendations for Other Services       Precautions / Restrictions Precautions Precautions: Fall Restrictions Weight Bearing Restrictions: No    Mobility  Bed Mobility Overal bed mobility: Needs Assistance Bed Mobility: Supine to Sit     Supine to sit: Min assist     General bed mobility comments: HOB flat and pt educated on log roll for ease of transition to sitting. Min assist for shoulder elevation. Pt reports that caregiver usually assists her with getting in/out of bed at baseline.   Transfers Overall transfer level: Needs assistance Equipment used: Rolling walker (2 wheeled) Transfers: Sit to/from Stand Sit to Stand: Min guard         General transfer comment: Pt able to power-up to full standing with 1 attempt this session. No physical assist was required however close guard provided for safety.   Ambulation/Gait Ambulation/Gait assistance: Min guard Ambulation Distance (Feet): 20 Feet Assistive device: Rolling walker (2 wheeled) Gait  Pattern/deviations: Step-through pattern;Decreased stride length;Trunk flexed Gait velocity: Decreased Gait velocity interpretation: Below normal speed for age/gender General Gait Details: Pt was able to ambulate around the room with RW and supplemental O2 donned. Pt mildly dyspneic after gait training and sats at 94%. Pt states that at home she would not have to ambulate any farther than that for basic needs or entrance into home.    Stairs            Wheelchair Mobility    Modified Rankin (Stroke Patients Only)       Balance Overall balance assessment: Needs assistance Sitting-balance support: Feet supported;No upper extremity supported Sitting balance-Leahy Scale: Fair     Standing balance support: Bilateral upper extremity supported Standing balance-Leahy Scale: Poor Standing balance comment: Pt requires UE support                    Cognition Arousal/Alertness: Awake/alert Behavior During Therapy: Flat affect Overall Cognitive Status: Within Functional Limits for tasks assessed                      Exercises Total Joint Exercises Quad Sets: 10 reps Hip ABduction/ADduction: 10 reps Long Arc Quad: 10 reps    General Comments General comments (skin integrity, edema, etc.): Pt was issued a HEP handout and discussed technique/frequency/reps.       Pertinent Vitals/Pain Pain Assessment: No/denies pain    Home Living                      Prior Function  PT Goals (current goals can now be found in the care plan section) Acute Rehab PT Goals Patient Stated Goal: home PT Goal Formulation: With patient Time For Goal Achievement: 05/13/14 Potential to Achieve Goals: Good Progress towards PT goals: Progressing toward goals    Frequency  Min 3X/week    PT Plan Current plan remains appropriate    Co-evaluation             End of Session Equipment Utilized During Treatment: Gait belt;Oxygen Activity Tolerance:  Patient tolerated treatment well Patient left: in chair;with call bell/phone within reach     Time: 0822-0900 PT Time Calculation (min) (ACUTE ONLY): 38 min  Charges:  $Gait Training: 8-22 mins $Therapeutic Exercise: 8-22 mins $Therapeutic Activity: 8-22 mins                    G Codes:      Rolinda Roan 2014/06/07, 9:10 AM   Rolinda Roan, PT, DPT Acute Rehabilitation Services Pager: [phone]

## 2014-05-10 LAB — CULTURE, BLOOD (ROUTINE X 2)
CULTURE: NO GROWTH
Culture: NO GROWTH

## 2014-05-15 ENCOUNTER — Inpatient Hospital Stay (HOSPITAL_COMMUNITY)
Admission: EM | Admit: 2014-05-15 | Discharge: 2014-05-18 | DRG: 193 | Disposition: A | Discharge status: Skilled Nursing Facility | Payer: Medicare Other | Attending: Internal Medicine | Admitting: Internal Medicine

## 2014-05-15 ENCOUNTER — Encounter (HOSPITAL_COMMUNITY): Payer: Self-pay | Admitting: Emergency Medicine

## 2014-05-15 ENCOUNTER — Emergency Department (HOSPITAL_COMMUNITY): Payer: Medicare Other

## 2014-05-15 DIAGNOSIS — I959 Hypotension, unspecified: Secondary | ICD-10-CM | POA: Diagnosis present

## 2014-05-15 DIAGNOSIS — R339 Retention of urine, unspecified: Secondary | ICD-10-CM | POA: Diagnosis present

## 2014-05-15 DIAGNOSIS — F42 Obsessive-compulsive disorder: Secondary | ICD-10-CM | POA: Diagnosis present

## 2014-05-15 DIAGNOSIS — Z6831 Body mass index (BMI) 31.0-31.9, adult: Secondary | ICD-10-CM | POA: Diagnosis not present

## 2014-05-15 DIAGNOSIS — F329 Major depressive disorder, single episode, unspecified: Secondary | ICD-10-CM | POA: Diagnosis present

## 2014-05-15 DIAGNOSIS — E871 Hypo-osmolality and hyponatremia: Secondary | ICD-10-CM | POA: Diagnosis not present

## 2014-05-15 DIAGNOSIS — F17210 Nicotine dependence, cigarettes, uncomplicated: Secondary | ICD-10-CM | POA: Diagnosis present

## 2014-05-15 DIAGNOSIS — Z9841 Cataract extraction status, right eye: Secondary | ICD-10-CM | POA: Diagnosis not present

## 2014-05-15 DIAGNOSIS — K760 Fatty (change of) liver, not elsewhere classified: Secondary | ICD-10-CM | POA: Diagnosis present

## 2014-05-15 DIAGNOSIS — M549 Dorsalgia, unspecified: Secondary | ICD-10-CM | POA: Diagnosis not present

## 2014-05-15 DIAGNOSIS — Z91013 Allergy to seafood: Secondary | ICD-10-CM | POA: Diagnosis not present

## 2014-05-15 DIAGNOSIS — J9611 Chronic respiratory failure with hypoxia: Secondary | ICD-10-CM | POA: Diagnosis present

## 2014-05-15 DIAGNOSIS — E780 Pure hypercholesterolemia: Secondary | ICD-10-CM | POA: Diagnosis present

## 2014-05-15 DIAGNOSIS — Z888 Allergy status to other drugs, medicaments and biological substances status: Secondary | ICD-10-CM

## 2014-05-15 DIAGNOSIS — R627 Adult failure to thrive: Secondary | ICD-10-CM | POA: Diagnosis present

## 2014-05-15 DIAGNOSIS — Z9842 Cataract extraction status, left eye: Secondary | ICD-10-CM | POA: Diagnosis not present

## 2014-05-15 DIAGNOSIS — J45909 Unspecified asthma, uncomplicated: Secondary | ICD-10-CM | POA: Diagnosis present

## 2014-05-15 DIAGNOSIS — E1142 Type 2 diabetes mellitus with diabetic polyneuropathy: Secondary | ICD-10-CM

## 2014-05-15 DIAGNOSIS — G9341 Metabolic encephalopathy: Secondary | ICD-10-CM | POA: Diagnosis not present

## 2014-05-15 DIAGNOSIS — R200 Anesthesia of skin: Secondary | ICD-10-CM

## 2014-05-15 DIAGNOSIS — Y95 Nosocomial condition: Secondary | ICD-10-CM | POA: Diagnosis present

## 2014-05-15 DIAGNOSIS — E785 Hyperlipidemia, unspecified: Secondary | ICD-10-CM | POA: Diagnosis present

## 2014-05-15 DIAGNOSIS — K589 Irritable bowel syndrome without diarrhea: Secondary | ICD-10-CM | POA: Diagnosis present

## 2014-05-15 DIAGNOSIS — Z91040 Latex allergy status: Secondary | ICD-10-CM

## 2014-05-15 DIAGNOSIS — J449 Chronic obstructive pulmonary disease, unspecified: Secondary | ICD-10-CM | POA: Diagnosis present

## 2014-05-15 DIAGNOSIS — Z961 Presence of intraocular lens: Secondary | ICD-10-CM | POA: Diagnosis present

## 2014-05-15 DIAGNOSIS — Z9981 Dependence on supplemental oxygen: Secondary | ICD-10-CM

## 2014-05-15 DIAGNOSIS — D61818 Other pancytopenia: Secondary | ICD-10-CM | POA: Diagnosis present

## 2014-05-15 DIAGNOSIS — G8929 Other chronic pain: Secondary | ICD-10-CM | POA: Diagnosis present

## 2014-05-15 DIAGNOSIS — K7290 Hepatic failure, unspecified without coma: Secondary | ICD-10-CM | POA: Diagnosis present

## 2014-05-15 DIAGNOSIS — G4730 Sleep apnea, unspecified: Secondary | ICD-10-CM | POA: Diagnosis present

## 2014-05-15 DIAGNOSIS — M1990 Unspecified osteoarthritis, unspecified site: Secondary | ICD-10-CM | POA: Diagnosis present

## 2014-05-15 DIAGNOSIS — R531 Weakness: Secondary | ICD-10-CM

## 2014-05-15 DIAGNOSIS — K7460 Unspecified cirrhosis of liver: Secondary | ICD-10-CM | POA: Diagnosis present

## 2014-05-15 DIAGNOSIS — E039 Hypothyroidism, unspecified: Secondary | ICD-10-CM | POA: Diagnosis present

## 2014-05-15 DIAGNOSIS — Z9851 Tubal ligation status: Secondary | ICD-10-CM | POA: Diagnosis not present

## 2014-05-15 DIAGNOSIS — F4310 Post-traumatic stress disorder, unspecified: Secondary | ICD-10-CM | POA: Diagnosis present

## 2014-05-15 DIAGNOSIS — F410 Panic disorder [episodic paroxysmal anxiety] without agoraphobia: Secondary | ICD-10-CM | POA: Diagnosis present

## 2014-05-15 DIAGNOSIS — E119 Type 2 diabetes mellitus without complications: Secondary | ICD-10-CM

## 2014-05-15 DIAGNOSIS — E43 Unspecified severe protein-calorie malnutrition: Secondary | ICD-10-CM | POA: Diagnosis present

## 2014-05-15 DIAGNOSIS — I5033 Acute on chronic diastolic (congestive) heart failure: Secondary | ICD-10-CM | POA: Diagnosis present

## 2014-05-15 DIAGNOSIS — Z86010 Personal history of colonic polyps: Secondary | ICD-10-CM

## 2014-05-15 DIAGNOSIS — G92 Toxic encephalopathy: Secondary | ICD-10-CM | POA: Diagnosis present

## 2014-05-15 DIAGNOSIS — Z886 Allergy status to analgesic agent status: Secondary | ICD-10-CM

## 2014-05-15 DIAGNOSIS — J189 Pneumonia, unspecified organism: Secondary | ICD-10-CM | POA: Diagnosis present

## 2014-05-15 DIAGNOSIS — Z79899 Other long term (current) drug therapy: Secondary | ICD-10-CM

## 2014-05-15 DIAGNOSIS — J441 Chronic obstructive pulmonary disease with (acute) exacerbation: Secondary | ICD-10-CM | POA: Diagnosis present

## 2014-05-15 DIAGNOSIS — I10 Essential (primary) hypertension: Secondary | ICD-10-CM | POA: Diagnosis present

## 2014-05-15 DIAGNOSIS — K7682 Hepatic encephalopathy: Secondary | ICD-10-CM | POA: Diagnosis present

## 2014-05-15 DIAGNOSIS — K219 Gastro-esophageal reflux disease without esophagitis: Secondary | ICD-10-CM | POA: Diagnosis present

## 2014-05-15 DIAGNOSIS — F40240 Claustrophobia: Secondary | ICD-10-CM | POA: Diagnosis present

## 2014-05-15 HISTORY — DX: Obsessive-compulsive disorder, unspecified: F42.9

## 2014-05-15 HISTORY — DX: Acute on chronic diastolic (congestive) heart failure: I50.33

## 2014-05-15 HISTORY — DX: Panic disorder (episodic paroxysmal anxiety): F41.0

## 2014-05-15 HISTORY — DX: Chronic respiratory failure, unspecified whether with hypoxia or hypercapnia: J96.10

## 2014-05-15 HISTORY — DX: Post-traumatic stress disorder, unspecified: F43.10

## 2014-05-15 LAB — CBC WITH DIFFERENTIAL/PLATELET
Basophils Absolute: 0 10*3/uL (ref 0.0–0.1)
Basophils Relative: 0 % (ref 0–1)
EOS PCT: 1 % (ref 0–5)
Eosinophils Absolute: 0 10*3/uL (ref 0.0–0.7)
HEMATOCRIT: 26.8 % — AB (ref 36.0–46.0)
HEMOGLOBIN: 8.9 g/dL — AB (ref 12.0–15.0)
LYMPHS ABS: 0.8 10*3/uL (ref 0.7–4.0)
LYMPHS PCT: 27 % (ref 12–46)
MCH: 31.7 pg (ref 26.0–34.0)
MCHC: 33.2 g/dL (ref 30.0–36.0)
MCV: 95.4 fL (ref 78.0–100.0)
Monocytes Absolute: 0.2 10*3/uL (ref 0.1–1.0)
Monocytes Relative: 7 % (ref 3–12)
Neutro Abs: 1.9 10*3/uL (ref 1.7–7.7)
Neutrophils Relative %: 65 % (ref 43–77)
Platelets: 142 10*3/uL — ABNORMAL LOW (ref 150–400)
RBC: 2.81 MIL/uL — AB (ref 3.87–5.11)
RDW: 13.8 % (ref 11.5–15.5)
WBC: 3 10*3/uL — ABNORMAL LOW (ref 4.0–10.5)

## 2014-05-15 LAB — CBC
HCT: 26.9 % — ABNORMAL LOW (ref 36.0–46.0)
Hemoglobin: 9 g/dL — ABNORMAL LOW (ref 12.0–15.0)
MCH: 32.1 pg (ref 26.0–34.0)
MCHC: 33.5 g/dL (ref 30.0–36.0)
MCV: 96.1 fL (ref 78.0–100.0)
PLATELETS: 129 10*3/uL — AB (ref 150–400)
RBC: 2.8 MIL/uL — ABNORMAL LOW (ref 3.87–5.11)
RDW: 13.8 % (ref 11.5–15.5)
WBC: 3.1 10*3/uL — AB (ref 4.0–10.5)

## 2014-05-15 LAB — COMPREHENSIVE METABOLIC PANEL
ALT: 26 U/L (ref 0–35)
AST: 37 U/L (ref 0–37)
Albumin: 2.9 g/dL — ABNORMAL LOW (ref 3.5–5.2)
Alkaline Phosphatase: 292 U/L — ABNORMAL HIGH (ref 39–117)
Anion gap: 9 (ref 5–15)
BUN: 18 mg/dL (ref 6–23)
CO2: 27 mmol/L (ref 19–32)
CREATININE: 1.07 mg/dL (ref 0.50–1.10)
Calcium: 8.3 mg/dL — ABNORMAL LOW (ref 8.4–10.5)
Chloride: 90 mmol/L — ABNORMAL LOW (ref 96–112)
GFR calc Af Amer: 64 mL/min — ABNORMAL LOW (ref >90)
GFR, EST NON AFRICAN AMERICAN: 55 mL/min — AB (ref >90)
GLUCOSE: 95 mg/dL (ref 70–99)
Potassium: 4.4 mmol/L (ref 3.5–5.1)
SODIUM: 126 mmol/L — AB (ref 135–145)
TOTAL PROTEIN: 6.1 g/dL (ref 6.0–8.3)
Total Bilirubin: 0.5 mg/dL (ref 0.3–1.2)

## 2014-05-15 LAB — CREATININE, SERUM
Creatinine, Ser: 1.02 mg/dL (ref 0.50–1.10)
GFR calc non Af Amer: 59 mL/min — ABNORMAL LOW (ref >90)
GFR, EST AFRICAN AMERICAN: 68 mL/min — AB (ref >90)

## 2014-05-15 LAB — BRAIN NATRIURETIC PEPTIDE: B NATRIURETIC PEPTIDE 5: 139.1 pg/mL — AB (ref 0.0–100.0)

## 2014-05-15 LAB — URINALYSIS, ROUTINE W REFLEX MICROSCOPIC
Bilirubin Urine: NEGATIVE
Glucose, UA: NEGATIVE mg/dL
Hgb urine dipstick: NEGATIVE
KETONES UR: NEGATIVE mg/dL
Leukocytes, UA: NEGATIVE
Nitrite: NEGATIVE
Protein, ur: NEGATIVE mg/dL
Specific Gravity, Urine: 1.01 (ref 1.005–1.030)
UROBILINOGEN UA: 0.2 mg/dL (ref 0.0–1.0)
pH: 6 (ref 5.0–8.0)

## 2014-05-15 LAB — GLUCOSE, CAPILLARY: Glucose-Capillary: 101 mg/dL — ABNORMAL HIGH (ref 70–99)

## 2014-05-15 LAB — I-STAT TROPONIN, ED: TROPONIN I, POC: 0.01 ng/mL (ref 0.00–0.08)

## 2014-05-15 MED ORDER — FOLIC ACID 1 MG PO TABS
1.0000 mg | ORAL_TABLET | Freq: Every day | ORAL | Status: DC
  Administered 2014-05-16 – 2014-05-18 (×3): 1 mg via ORAL
  Filled 2014-05-15 (×3): qty 1

## 2014-05-15 MED ORDER — SODIUM CHLORIDE 0.9 % IV BOLUS (SEPSIS)
1000.0000 mL | Freq: Once | INTRAVENOUS | Status: AC
  Administered 2014-05-15: 1000 mL via INTRAVENOUS

## 2014-05-15 MED ORDER — IPRATROPIUM-ALBUTEROL 18-103 MCG/ACT IN AERO
2.0000 | INHALATION_SPRAY | Freq: Two times a day (BID) | RESPIRATORY_TRACT | Status: DC
Start: 2014-05-15 — End: 2014-05-15

## 2014-05-15 MED ORDER — CLONAZEPAM 0.5 MG PO TABS
0.5000 mg | ORAL_TABLET | Freq: Every day | ORAL | Status: DC
  Administered 2014-05-17 – 2014-05-18 (×2): 0.5 mg via ORAL
  Filled 2014-05-15 (×3): qty 1

## 2014-05-15 MED ORDER — MONTELUKAST SODIUM 10 MG PO TABS
10.0000 mg | ORAL_TABLET | Freq: Every day | ORAL | Status: DC
  Administered 2014-05-15 – 2014-05-17 (×3): 10 mg via ORAL
  Filled 2014-05-15 (×3): qty 1

## 2014-05-15 MED ORDER — HYDROMORPHONE HCL 2 MG PO TABS
2.0000 mg | ORAL_TABLET | Freq: Four times a day (QID) | ORAL | Status: DC | PRN
  Administered 2014-05-15: 2 mg via ORAL
  Filled 2014-05-15: qty 1

## 2014-05-15 MED ORDER — ALLOPURINOL 100 MG PO TABS
100.0000 mg | ORAL_TABLET | Freq: Two times a day (BID) | ORAL | Status: DC
  Administered 2014-05-15 – 2014-05-18 (×6): 100 mg via ORAL
  Filled 2014-05-15 (×6): qty 1

## 2014-05-15 MED ORDER — ACETAMINOPHEN 325 MG PO TABS
650.0000 mg | ORAL_TABLET | Freq: Four times a day (QID) | ORAL | Status: DC | PRN
  Administered 2014-05-16: 650 mg via ORAL
  Filled 2014-05-15: qty 2

## 2014-05-15 MED ORDER — ADULT MULTIVITAMIN W/MINERALS CH
1.0000 | ORAL_TABLET | Freq: Every day | ORAL | Status: DC
  Administered 2014-05-16 – 2014-05-18 (×3): 1 via ORAL
  Filled 2014-05-15 (×3): qty 1

## 2014-05-15 MED ORDER — ONDANSETRON HCL 4 MG/2ML IJ SOLN
4.0000 mg | Freq: Four times a day (QID) | INTRAMUSCULAR | Status: DC | PRN
  Administered 2014-05-17: 4 mg via INTRAVENOUS
  Filled 2014-05-15: qty 2

## 2014-05-15 MED ORDER — VANCOMYCIN HCL IN DEXTROSE 750-5 MG/150ML-% IV SOLN
750.0000 mg | Freq: Two times a day (BID) | INTRAVENOUS | Status: DC
  Administered 2014-05-16 (×2): 750 mg via INTRAVENOUS
  Filled 2014-05-15 (×2): qty 150

## 2014-05-15 MED ORDER — METFORMIN HCL ER 500 MG PO TB24
1000.0000 mg | ORAL_TABLET | Freq: Every day | ORAL | Status: DC
  Administered 2014-05-16 – 2014-05-18 (×3): 1000 mg via ORAL
  Filled 2014-05-15 (×3): qty 2

## 2014-05-15 MED ORDER — ACETAMINOPHEN 650 MG RE SUPP: 650.0000 mg | Freq: Four times a day (QID) | RECTAL | Status: DC | PRN

## 2014-05-15 MED ORDER — DOCUSATE SODIUM 100 MG PO CAPS
100.0000 mg | ORAL_CAPSULE | Freq: Two times a day (BID) | ORAL | Status: DC
  Administered 2014-05-15 – 2014-05-18 (×6): 100 mg via ORAL
  Filled 2014-05-15 (×7): qty 1

## 2014-05-15 MED ORDER — SODIUM CHLORIDE 0.9 % IV SOLN
INTRAVENOUS | Status: DC
  Administered 2014-05-15: via INTRAVENOUS

## 2014-05-15 MED ORDER — VITAMIN B-1 100 MG PO TABS
100.0000 mg | ORAL_TABLET | Freq: Every day | ORAL | Status: DC
  Administered 2014-05-16 – 2014-05-18 (×3): 100 mg via ORAL
  Filled 2014-05-15 (×3): qty 1

## 2014-05-15 MED ORDER — ARIPIPRAZOLE 5 MG PO TABS
5.0000 mg | ORAL_TABLET | Freq: Every day | ORAL | Status: DC
  Administered 2014-05-16 – 2014-05-18 (×3): 5 mg via ORAL
  Filled 2014-05-15 (×3): qty 1

## 2014-05-15 MED ORDER — ONDANSETRON HCL 4 MG PO TABS: 4.0000 mg | ORAL_TABLET | Freq: Four times a day (QID) | ORAL | Status: DC | PRN

## 2014-05-15 MED ORDER — SAXAGLIPTIN-METFORMIN ER 2.5-1000 MG PO TB24: 1.0000 | ORAL_TABLET | Freq: Two times a day (BID) | ORAL | Status: DC

## 2014-05-15 MED ORDER — METOPROLOL TARTRATE 25 MG PO TABS
25.0000 mg | ORAL_TABLET | Freq: Two times a day (BID) | ORAL | Status: DC
  Administered 2014-05-15 – 2014-05-18 (×6): 25 mg via ORAL
  Filled 2014-05-15 (×6): qty 1

## 2014-05-15 MED ORDER — SERTRALINE HCL 100 MG PO TABS
100.0000 mg | ORAL_TABLET | Freq: Every day | ORAL | Status: DC
  Administered 2014-05-15 – 2014-05-17 (×3): 100 mg via ORAL
  Filled 2014-05-15 (×3): qty 1

## 2014-05-15 MED ORDER — GABAPENTIN 300 MG PO CAPS
300.0000 mg | ORAL_CAPSULE | Freq: Every day | ORAL | Status: DC
  Administered 2014-05-15: 300 mg via ORAL
  Filled 2014-05-15: qty 1

## 2014-05-15 MED ORDER — SODIUM CHLORIDE 0.9 % IJ SOLN
3.0000 mL | Freq: Two times a day (BID) | INTRAMUSCULAR | Status: DC
  Administered 2014-05-15 – 2014-05-18 (×5): 3 mL via INTRAVENOUS

## 2014-05-15 MED ORDER — LAMOTRIGINE 150 MG PO TABS
150.0000 mg | ORAL_TABLET | Freq: Every day | ORAL | Status: DC
  Administered 2014-05-15 – 2014-05-17 (×3): 150 mg via ORAL
  Filled 2014-05-15 (×3): qty 1

## 2014-05-15 MED ORDER — MAGNESIUM OXIDE 400 (241.3 MG) MG PO TABS
400.0000 mg | ORAL_TABLET | Freq: Two times a day (BID) | ORAL | Status: DC
  Administered 2014-05-15 – 2014-05-18 (×6): 400 mg via ORAL
  Filled 2014-05-15 (×6): qty 1

## 2014-05-15 MED ORDER — ATORVASTATIN CALCIUM 10 MG PO TABS
10.0000 mg | ORAL_TABLET | Freq: Every day | ORAL | Status: DC
  Administered 2014-05-16 – 2014-05-17 (×2): 10 mg via ORAL
  Filled 2014-05-15 (×3): qty 1

## 2014-05-15 MED ORDER — CYCLOBENZAPRINE HCL 10 MG PO TABS: 10.0000 mg | ORAL_TABLET | Freq: Three times a day (TID) | ORAL | Status: DC | PRN

## 2014-05-15 MED ORDER — BUPRENORPHINE 10 MCG/HR TD PTWK: 10.0000 ug | MEDICATED_PATCH | TRANSDERMAL | Status: DC

## 2014-05-15 MED ORDER — BUDESONIDE-FORMOTEROL FUMARATE 80-4.5 MCG/ACT IN AERO
2.0000 | INHALATION_SPRAY | Freq: Two times a day (BID) | RESPIRATORY_TRACT | Status: DC | PRN
  Filled 2014-05-15: qty 6.9

## 2014-05-15 MED ORDER — LEVOTHYROXINE SODIUM 75 MCG PO TABS
75.0000 ug | ORAL_TABLET | Freq: Every day | ORAL | Status: DC
  Administered 2014-05-16 – 2014-05-18 (×3): 75 ug via ORAL
  Filled 2014-05-15 (×3): qty 1

## 2014-05-15 MED ORDER — DEXTROSE 5 % IV SOLN
2.0000 g | Freq: Three times a day (TID) | INTRAVENOUS | Status: DC
  Administered 2014-05-15 – 2014-05-16 (×2): 2 g via INTRAVENOUS
  Filled 2014-05-15 (×4): qty 2

## 2014-05-15 MED ORDER — IPRATROPIUM-ALBUTEROL 0.5-2.5 (3) MG/3ML IN SOLN
3.0000 mL | Freq: Two times a day (BID) | RESPIRATORY_TRACT | Status: DC
  Administered 2014-05-16 – 2014-05-18 (×5): 3 mL via RESPIRATORY_TRACT
  Filled 2014-05-15 (×6): qty 3

## 2014-05-15 MED ORDER — HEPARIN SODIUM (PORCINE) 5000 UNIT/ML IJ SOLN
5000.0000 [IU] | Freq: Three times a day (TID) | INTRAMUSCULAR | Status: DC
  Administered 2014-05-15 – 2014-05-18 (×8): 5000 [IU] via SUBCUTANEOUS
  Filled 2014-05-15 (×9): qty 1

## 2014-05-15 MED ORDER — PANTOPRAZOLE SODIUM 40 MG PO TBEC
40.0000 mg | DELAYED_RELEASE_TABLET | Freq: Every day | ORAL | Status: DC
  Administered 2014-05-16 – 2014-05-18 (×3): 40 mg via ORAL
  Filled 2014-05-15 (×3): qty 1

## 2014-05-15 MED ORDER — LINAGLIPTIN 5 MG PO TABS
5.0000 mg | ORAL_TABLET | Freq: Every day | ORAL | Status: DC
  Administered 2014-05-16 – 2014-05-18 (×3): 5 mg via ORAL
  Filled 2014-05-15 (×3): qty 1

## 2014-05-15 NOTE — ED Provider Notes (Signed)
CSN: [id number]     Arrival date & time 05/15/14  1608 History   First MD Initiated Contact with Patient 05/15/14 1631     Chief Complaint  Patient presents with  . Urinary Retention     (Consider location/radiation/quality/duration/timing/severity/associated sxs/prior Treatment) The history is provided by the patient and a significant other. No language interpreter was used.   Samantha Mcmillan is a 59 year old white female with a history of liver cirrhosis, CHF, DM, HTN, and urinary retention presents with weakness, urinary retention, and fall on 3/16. She does not remember how she fell or if she passed out. She reports increased leg swelling and weight gain. Her home health nurse decided to call the ambulance because the patient was leaning to the side without any knowledge of what was happening to her.  Her and her ex-husband state that she did not fall or hit her head today. She denies any fever, chest pain, abdominal pain, vomiting, diarrhea.  Past Medical History  Diagnosis Date  . GERD (gastroesophageal reflux disease)   . Colon polyp   . Cirrhosis of liver   . Hypertension   . Depression   . COPD (chronic obstructive pulmonary disease)   . IBS (irritable bowel syndrome)   . On home oxygen therapy     "2L; 24/7" (05/04/2014)  . Family history of adverse reaction to anesthesia     "my sister doesn't wake up good when she's put deep to sleep"  . High cholesterol   . Heart murmur   . CHF (congestive heart failure)   . Asthma   . Pneumonia "several times"  . Chronic bronchitis     "get it pretty much q yr"   . Sleep apnea     "can't tolerate CPAP" (05/04/2014)  . Type II diabetes mellitus   . History of blood transfusion     "related to my anemia"  . Anemia   . Iron deficiency anemia   . Headache     "more than a couple times/wk" (05/04/2014)  . Arthritis     "some in my feet" (05/04/2014)  . Chronic lower back pain   . Anxiety    Past Surgical History  Procedure Laterality  Date  . Cesarean section  1979  . Cataract extraction w/ intraocular lens  implant, bilateral Bilateral 2005  . Back surgery    . Colonoscopy  2014  . Upper gastrointestinal endoscopy    . Posterior fusion cervical spine  2015    "rebuilt 3 of my neck vertebrae"  . Incision and drainage of wound Right 2009    "leg mauled  by dog"  . Tubal ligation  1979  . Dilation and curettage of uterus     Family History  Problem Relation Age of Onset  . Cancer Father     pancreatic   History  Substance Use Topics  . Smoking status: Current Every Day Smoker -- 0.50 packs/day for 48 years    Types: Cigarettes  . Smokeless tobacco: Never Used  . Alcohol Use: No   OB History    Gravida Para Term Preterm AB TAB SAB Ectopic Multiple Living   3 3              Obstetric Comments   1st Menstrual Cycle:  10  1st Pregnancy:  14     Review of Systems  Constitutional: Negative for chills.  Gastrointestinal: Negative for blood in stool.  Neurological: Negative for seizures and syncope.  All other  systems reviewed and are negative.     Allergies  Iodine; Latex; Oxycodone; Percocet; Shellfish allergy; Amitriptyline; Wellbutrin; Augmentin; Codeine; Cortisone; Diphenhydramine; Other; and Vicodin  Home Medications   Prior to Admission medications   Medication Sig Start Date End Date Taking? Authorizing Provider  acetaminophen (TYLENOL) 500 MG tablet Take 1,000 mg by mouth every 6 (six) hours as needed for moderate pain or headache.   Yes Historical Provider, MD  albuterol-ipratropium (COMBIVENT) 18-103 MCG/ACT inhaler Inhale 2 puffs into the lungs 2 (two) times daily.   Yes Historical Provider, MD  allopurinol (ZYLOPRIM) 100 MG tablet Take 100 mg by mouth 2 (two) times daily.    Yes Historical Provider, MD  ARIPiprazole (ABILIFY) 5 MG tablet Take 5 mg by mouth daily.    Yes Historical Provider, MD  atorvastatin (LIPITOR) 10 MG tablet Take 10 mg by mouth daily at 6 PM.    Yes Historical  Provider, MD  budesonide-formoterol (SYMBICORT) 80-4.5 MCG/ACT inhaler Inhale 2 puffs into the lungs 2 (two) times daily as needed (shortness of breath).    Yes Historical Provider, MD  bumetanide (BUMEX) 1 MG tablet 1 tablet daily or as directed. 05/09/14  Yes Delfina Redwood, MD  buprenorphine (BUTRANS - DOSED MCG/HR) 10 MCG/HR PTWK patch Place 10 mcg onto the skin once a week.   Yes Historical Provider, MD  clonazePAM (KLONOPIN) 0.5 MG tablet Take 0.5 mg by mouth daily.   Yes Historical Provider, MD  cyclobenzaprine (FLEXERIL) 10 MG tablet Take 10 mg by mouth 3 (three) times daily as needed for muscle spasms.    Yes Historical Provider, MD  gabapentin (NEURONTIN) 300 MG capsule Take 300 mg by mouth at bedtime.   Yes Historical Provider, MD  HYDROmorphone (DILAUDID) 2 MG tablet Take 2 mg by mouth every 6 (six) hours as needed for severe pain.   Yes Historical Provider, MD  lamoTRIgine (LAMICTAL) 150 MG tablet Take 150 mg by mouth at bedtime.    Yes Historical Provider, MD  levothyroxine (SYNTHROID, LEVOTHROID) 75 MCG tablet Take 75 mcg by mouth daily.    Yes Historical Provider, MD  lisinopril (PRINIVIL,ZESTRIL) 20 MG tablet Take 10 mg by mouth 2 (two) times daily.    Yes Historical Provider, MD  magnesium oxide (MAG-OX) 400 MG tablet Take 400 mg by mouth 2 (two) times daily.   Yes Historical Provider, MD  metoprolol tartrate (LOPRESSOR) 25 MG tablet Take 25 mg by mouth 2 (two) times daily.   Yes Historical Provider, MD  montelukast (SINGULAIR) 10 MG tablet Take 10 mg by mouth at bedtime.   Yes Historical Provider, MD  pantoprazole (PROTONIX) 40 MG tablet Take 40 mg by mouth daily.   Yes Historical Provider, MD  potassium chloride (K-DUR) 10 MEQ tablet Take 10 mEq by mouth at bedtime.    Yes Historical Provider, MD  Pramoxine-HC (HYDROCORTISONE ACE-PRAMOXINE) 2.5-1 % CREA Apply 1 application topically 2 (two) times daily. Patient taking differently: Apply 1 application topically 2 (two) times  daily as needed.  03/15/14  Yes Seeplaputhur Robinette Haines, MD  promethazine (PHENERGAN) 12.5 MG tablet Take 1 tablet (12.5 mg total) by mouth every 6 (six) hours as needed for nausea or vomiting. 05/09/14  Yes Delfina Redwood, MD  Saxagliptin-Metformin 2.06-998 MG TB24 Take 1 tablet by mouth 2 (two) times daily. Hold until blood glucose greater than 150 05/09/14  Yes Delfina Redwood, MD  sertraline (ZOLOFT) 100 MG tablet Take 100 mg by mouth at bedtime.    Yes Historical  Provider, MD  spironolactone (ALDACTONE) 25 MG tablet Take 1 tablet (25 mg total) by mouth daily. 05/09/14 05/09/15 Yes Delfina Redwood, MD  Vitamin D, Ergocalciferol, (DRISDOL) 50000 UNITS CAPS capsule Take 50,000 Units by mouth every 7 (seven) days. Saturday   Yes Historical Provider, MD  azithromycin (ZITHROMAX) 500 MG tablet Take 1 tablet (500 mg total) by mouth daily. Patient not taking: Reported on 05/15/2014 05/10/14   Delfina Redwood, MD  cefUROXime (CEFTIN) 500 MG tablet Take 1 tablet (500 mg total) by mouth 2 (two) times daily with a meal. Patient not taking: Reported on 05/15/2014 05/09/14   Delfina Redwood, MD   BP 142/60 mmHg  Pulse 67  Temp(Src) 97.9 F (36.6 C) (Oral)  Resp 16  Ht 5\' 4"  (1.626 m)  Wt 182 lb 8.7 oz (82.8 kg)  BMI 31.32 kg/m2  SpO2 100% Physical Exam  Constitutional: She is oriented to person, place, and time. She appears well-developed and well-nourished.  Obese. On 2L of oxygen at bedside.  HENT:  Head: Normocephalic and atraumatic.  Eyes: Conjunctivae are normal.  Neck: Normal range of motion. Neck supple.  Cardiovascular: Normal rate, regular rhythm and normal heart sounds.   Pulmonary/Chest: Effort normal and breath sounds normal.  She has some petechiae on the upper chest as well as an ecchymotic area on the right side of the abdomen.   Abdominal: Soft. There is no tenderness.  Musculoskeletal: Normal range of motion.  She has both upper and lower extremity weakness bilaterally.   1+ pitting edema lower extremities bilaterally  Neurological: She is alert and oriented to person, place, and time. No sensory deficit.  Skin: Skin is warm and dry.  She has a 7cm hematoma over the left tibia.  She is able flex and extend the leg without difficulty.   Nursing note and vitals reviewed.   ED Course  Procedures (including critical care time) Labs Review Labs Reviewed  URINALYSIS, ROUTINE W REFLEX MICROSCOPIC - Abnormal; Notable for the following:    APPearance CLOUDY (*)    All other components within normal limits  COMPREHENSIVE METABOLIC PANEL - Abnormal; Notable for the following:    Sodium 126 (*)    Chloride 90 (*)    Calcium 8.3 (*)    Albumin 2.9 (*)    Alkaline Phosphatase 292 (*)    GFR calc non Af Amer 55 (*)    GFR calc Af Amer 64 (*)    All other components within normal limits  CBC WITH DIFFERENTIAL/PLATELET - Abnormal; Notable for the following:    WBC 3.0 (*)    RBC 2.81 (*)    Hemoglobin 8.9 (*)    HCT 26.8 (*)    Platelets 142 (*)    All other components within normal limits  BRAIN NATRIURETIC PEPTIDE - Abnormal; Notable for the following:    B Natriuretic Peptide 139.1 (*)    All other components within normal limits  CBC - Abnormal; Notable for the following:    WBC 3.1 (*)    RBC 2.80 (*)    Hemoglobin 9.0 (*)    HCT 26.9 (*)    Platelets 129 (*)    All other components within normal limits  CREATININE, SERUM - Abnormal; Notable for the following:    GFR calc non Af Amer 59 (*)    GFR calc Af Amer 68 (*)    All other components within normal limits  GLUCOSE, CAPILLARY - Abnormal; Notable for the following:  Glucose-Capillary 101 (*)    All other components within normal limits  CULTURE, BLOOD (ROUTINE X 2)  CULTURE, BLOOD (ROUTINE X 2)  TSH  CBC WITH DIFFERENTIAL/PLATELET  HEMOGLOBIN A1C  COMPREHENSIVE METABOLIC PANEL  CBC  I-STAT TROPOININ, ED    Imaging Review Dg Chest 2 View  05/15/2014   CLINICAL DATA:  Urinary  retention, urinary tract infection, asthma  EXAM: CHEST  2 VIEW  COMPARISON:  Radiograph 05/04/2014  FINDINGS: Normal cardiac silhouette. There is patchy bilateral airspace opacities not improved compared to prior. No pleural fluid. No pneumothorax.  IMPRESSION: Patchy bilateral airspace opacity representing pulmonary edema or multifocal infection. Favor edema. Minimal improvement.   Electronically Signed   By: Suzy Bouchard M.D.   On: 05/15/2014 17:50     EKG Interpretation None     ED ECG REPORT   Date: 05/16/2014  Rate: 64  Rhythm: Sinus rhythm  QRS Axis: 100 ms  Intervals:    PR 159 ms  ST/T Wave abnormalities: none  Conduction Disturbances  Narrative Interpretation:   Old EKG Reviewed: yes I have personally reviewed the EKG tracing and agree with the computerized printout as noted.  MDM   Final diagnoses:  Weakness  Urinary retention  Hyponatremia  Patient is more weak since she left the hospital on 3/16 and unable to urinate since 11pm last night. Her cath bag has 700cc's of urine at bedside.  On exam she has some upper and lower extremity weakness bilaterally.  She has 1+pitting edema in her lower extremities.    Her vitals are stable and CBC is comparable to previous on discharge 3/16. No urinary infection. Her chest xray shows pulmonary edema or multifocal infection that is minimally improved from 3/11. She is hyponatremic today. Troponin is negative.   I have discussed this patient with Dr. Alvino Chapel who spoke to Dr. Humphrey Rolls regarding admission to tele for weakness, urinary retention, and hyponatremia.     Ottie Glazier, PA-C 05/17/14 [place], MD 05/18/14 2350

## 2014-05-15 NOTE — H&P (Signed)
[facility] History and Physical  ARIELL GUNNELS WFU:[id number] DOB: [birth year] DOA: 05/15/2014  Referring physician: Ottie Glazier, PA PCP: [facility], MD   Chief Complaint: Weakness  HPI: Samantha Mcmillan is a 59 y.o. female presents with weakness and inability to ambulate. Patient was recently in the hospital for sepsis at [facility]. She was discharged on March 16th and states taht she has not really been able to do anything since she went home. She has difficulty walking even with a walker. She has not been able to eat and she states her appetite has been very poor. She states taht she has not had any fevers and no headaches. She has chest pain Which she states is always there. She also has not had a bowel movements since Friday. In addition she has not been urinating. Her appetite has been poor. In the ED she was noted to have hyponatremia and had a low blood pressures on presentation which is responding to fluids. Patient also has a history of cirrhosis and NAFLD. During her last admission she required ICU admission and was treated with antibiotics as it was felt she had HCAP and UTI  Review of Systems:  Complete ROS performed and is unremarkable other than noted above in HPI  Past Medical History  Diagnosis Date  . GERD (gastroesophageal reflux disease)   . Colon polyp   . Cirrhosis of liver   . Hypertension   . Depression   . COPD (chronic obstructive pulmonary disease)   . IBS (irritable bowel syndrome)   . On home oxygen therapy     "2L; 24/7" (05/04/2014)  . Family history of adverse reaction to anesthesia     "my sister doesn't wake up good when she's put deep to sleep"  . High cholesterol   . Heart murmur   . CHF (congestive heart failure)   . Asthma   . Pneumonia "several times"  . Chronic bronchitis     "get it pretty much q yr"   . Sleep apnea     "can't tolerate CPAP" (05/04/2014)  . Type II diabetes mellitus   . History of blood  transfusion     "related to my anemia"  . Anemia   . Iron deficiency anemia   . Headache     "more than a couple times/wk" (05/04/2014)  . Arthritis     "some in my feet" (05/04/2014)  . Chronic lower back pain   . Anxiety    Past Surgical History  Procedure Laterality Date  . Cesarean section  1979  . Cataract extraction w/ intraocular lens  implant, bilateral Bilateral 2005  . Back surgery    . Colonoscopy  2014  . Upper gastrointestinal endoscopy    . Posterior fusion cervical spine  2015    "rebuilt 3 of my neck vertebrae"  . Incision and drainage of wound Right 2009    "leg mauled  by dog"  . Tubal ligation  1979  . Dilation and curettage of uterus     Social History:  reports that she has been smoking Cigarettes.  She has a 24 pack-year smoking history. She has never used smokeless tobacco. She reports that she does not drink alcohol or use illicit drugs.  Allergies  Allergen Reactions  . Iodine Shortness Of Breath  . Latex Anaphylaxis  . Oxycodone Shortness Of Breath  . Percocet [Oxycodone-Acetaminophen] Shortness Of Breath  . Shellfish Allergy Anaphylaxis  . Amitriptyline Other (See Comments)    Mental  changes  . Wellbutrin [Bupropion] Other (See Comments)    Mental changes  . Augmentin [Amoxicillin-Pot Clavulanate] Rash  . Codeine Rash  . Cortisone Rash  . Diphenhydramine Rash  . Other Rash    darvocet  . Vicodin [Hydrocodone-Acetaminophen] Rash    dizzy    Family History  Problem Relation Age of Onset  . Cancer Father     pancreatic     Prior to Admission medications   Medication Sig Start Date End Date Taking? Authorizing Provider  acetaminophen (TYLENOL) 500 MG tablet Take 1,000 mg by mouth every 6 (six) hours as needed for moderate pain or headache.   Yes Historical Provider, MD  albuterol-ipratropium (COMBIVENT) 18-103 MCG/ACT inhaler Inhale 2 puffs into the lungs 2 (two) times daily.   Yes Historical Provider, MD  allopurinol (ZYLOPRIM) 100 MG  tablet Take 100 mg by mouth 2 (two) times daily.    Yes Historical Provider, MD  ARIPiprazole (ABILIFY) 5 MG tablet Take 5 mg by mouth daily.    Yes Historical Provider, MD  atorvastatin (LIPITOR) 10 MG tablet Take 10 mg by mouth daily at 6 PM.    Yes Historical Provider, MD  budesonide-formoterol (SYMBICORT) 80-4.5 MCG/ACT inhaler Inhale 2 puffs into the lungs 2 (two) times daily as needed (shortness of breath).    Yes Historical Provider, MD  bumetanide (BUMEX) 1 MG tablet 1 tablet daily or as directed. 05/09/14  Yes Delfina Redwood, MD  buprenorphine (BUTRANS - DOSED MCG/HR) 10 MCG/HR PTWK patch Place 10 mcg onto the skin once a week.   Yes Historical Provider, MD  clonazePAM (KLONOPIN) 0.5 MG tablet Take 0.5 mg by mouth daily.   Yes Historical Provider, MD  cyclobenzaprine (FLEXERIL) 10 MG tablet Take 10 mg by mouth 3 (three) times daily as needed for muscle spasms.    Yes Historical Provider, MD  gabapentin (NEURONTIN) 300 MG capsule Take 300 mg by mouth at bedtime.   Yes Historical Provider, MD  HYDROmorphone (DILAUDID) 2 MG tablet Take 2 mg by mouth every 6 (six) hours as needed for severe pain.   Yes Historical Provider, MD  lamoTRIgine (LAMICTAL) 150 MG tablet Take 150 mg by mouth at bedtime.    Yes Historical Provider, MD  levothyroxine (SYNTHROID, LEVOTHROID) 75 MCG tablet Take 75 mcg by mouth daily.    Yes Historical Provider, MD  lisinopril (PRINIVIL,ZESTRIL) 20 MG tablet Take 10 mg by mouth 2 (two) times daily.    Yes Historical Provider, MD  magnesium oxide (MAG-OX) 400 MG tablet Take 400 mg by mouth 2 (two) times daily.   Yes Historical Provider, MD  metoprolol tartrate (LOPRESSOR) 25 MG tablet Take 25 mg by mouth 2 (two) times daily.   Yes Historical Provider, MD  montelukast (SINGULAIR) 10 MG tablet Take 10 mg by mouth at bedtime.   Yes Historical Provider, MD  pantoprazole (PROTONIX) 40 MG tablet Take 40 mg by mouth daily.   Yes Historical Provider, MD  potassium chloride  (K-DUR) 10 MEQ tablet Take 10 mEq by mouth at bedtime.    Yes Historical Provider, MD  Pramoxine-HC (HYDROCORTISONE ACE-PRAMOXINE) 2.5-1 % CREA Apply 1 application topically 2 (two) times daily. Patient taking differently: Apply 1 application topically 2 (two) times daily as needed.  03/15/14  Yes Seeplaputhur Robinette Haines, MD  promethazine (PHENERGAN) 12.5 MG tablet Take 1 tablet (12.5 mg total) by mouth every 6 (six) hours as needed for nausea or vomiting. 05/09/14  Yes Delfina Redwood, MD  Saxagliptin-Metformin 2.06-998 MG [phone]  Take 1 tablet by mouth 2 (two) times daily. Hold until blood glucose greater than 150 05/09/14  Yes Delfina Redwood, MD  sertraline (ZOLOFT) 100 MG tablet Take 100 mg by mouth at bedtime.    Yes Historical Provider, MD  spironolactone (ALDACTONE) 25 MG tablet Take 1 tablet (25 mg total) by mouth daily. 05/09/14 05/09/15 Yes Delfina Redwood, MD  Vitamin D, Ergocalciferol, (DRISDOL) 50000 UNITS CAPS capsule Take 50,000 Units by mouth every 7 (seven) days. Saturday   Yes Historical Provider, MD  azithromycin (ZITHROMAX) 500 MG tablet Take 1 tablet (500 mg total) by mouth daily. Patient not taking: Reported on 05/15/2014 05/10/14   Delfina Redwood, MD  cefUROXime (CEFTIN) 500 MG tablet Take 1 tablet (500 mg total) by mouth 2 (two) times daily with a meal. Patient not taking: Reported on 05/15/2014 05/09/14   Delfina Redwood, MD   Physical Exam: Filed Vitals:   05/15/14 1835 05/15/14 1900 05/15/14 2030 05/15/14 2100  BP: 101/54 125/63 141/58 135/59  Pulse: 57 67 69 66  Temp:      TempSrc:      Resp: 10 13 10 10   SpO2: 100% 98% 100% 100%    Wt Readings from Last 3 Encounters:  05/08/14 90.03 kg (198 lb 7.7 oz)  03/15/14 100.699 kg (222 lb)    General:  Appears calm and comfortable Eyes: PERRL, normal lids, irises & conjunctiva ENT: grossly normal hearing, lips & tongue Neck: no LAD, masses or thyromegaly Cardiovascular: RRR, no m/r/g. ++LE edema. Respiratory:  CTA bilaterally, no w/r/r. Normal respiratory effort. Abdomen: soft, ntnd Skin: bilateral LE erythema and has a blister noted on the left anterior lower leg from a fall Musculoskeletal: grossly normal tone BUE/BLE Psychiatric: grossly normal mood and affect Neurologic: grossly non-focal.          Labs on Admission:  Basic Metabolic Panel:  Recent Labs Lab 05/15/14 1729  NA 126*  K 4.4  CL 90*  CO2 27  GLUCOSE 95  BUN 18  CREATININE 1.07  CALCIUM 8.3*   Liver Function Tests:  Recent Labs Lab 05/15/14 1729  AST 37  ALT 26  ALKPHOS 292*  BILITOT 0.5  PROT 6.1  ALBUMIN 2.9*   No results for input(s): LIPASE, AMYLASE in the last 168 hours. No results for input(s): AMMONIA in the last 168 hours. CBC:  Recent Labs Lab 05/15/14 1845  WBC 3.0*  NEUTROABS 1.9  HGB 8.9*  HCT 26.8*  MCV 95.4  PLT 142*   Cardiac Enzymes: No results for input(s): CKTOTAL, CKMB, CKMBINDEX, TROPONINI in the last 168 hours.  BNP (last 3 results)  Recent Labs  05/04/14 1140 05/15/14 1845  BNP 210.9* 139.1*    ProBNP (last 3 results) No results for input(s): PROBNP in the last 8760 hours.  CBG:  Recent Labs Lab 05/09/14 0759 05/09/14 1153  GLUCAP 96 180*    Radiological Exams on Admission: Dg Chest 2 View  05/15/2014   CLINICAL DATA:  Urinary retention, urinary tract infection, asthma  EXAM: CHEST  2 VIEW  COMPARISON:  Radiograph 05/04/2014  FINDINGS: Normal cardiac silhouette. There is patchy bilateral airspace opacities not improved compared to prior. No pleural fluid. No pneumothorax.  IMPRESSION: Patchy bilateral airspace opacity representing pulmonary edema or multifocal infection. Favor edema. Minimal improvement.   Electronically Signed   By: Suzy Bouchard M.D.   On: 05/15/2014 17:50     Assessment/Plan Active Problems:   HCAP (healthcare-associated pneumonia)   DM2 (diabetes mellitus, type  2)   Weakness   Hyponatremia   1. Possible HCAP -will start on  empiric antibiotics as she has not really improved to baseline and there is ongoing infiltrate noted on CXR -will get cultures now -will monitor CXR  2. Type 2 DM -will monitor FSBS -will continue with oral agents  3. Hyponatremia -started on IVF with NS -repeat labs in am  4. Weakness/Hypotension -appears to be responding to fluids will continue -will hold her diuretics for now and also antihypertensive and reassess in am  5. LE erythema possible Cellulitis -will start empiric antibiotics  6. Social -she may need placement -will get social services consult  Code Status: Full Code (must indicate code status--if unknown or must be presumed, indicate so) DVT Prophylaxis:Heparin Family Communication: Ex Husband (indicate person spoken with, if applicable, with phone number if by telephone) Disposition Plan: May need placement (indicate anticipated LOS)  Time spent: 33min  Kaspian Muccio A [facility] Pager [phone]

## 2014-05-15 NOTE — ED Notes (Signed)
Bed: WA21 Expected date:  Expected time:  Means of arrival:  Comments: EMS-UTI 

## 2014-05-15 NOTE — ED Notes (Signed)
Per ems pt from home, pt co urinary retention, pt has hx UTI, was seen on 3/11. Pt denies back pain or nauea. . Pt is alert and oriented. Pt denies pain at this time.

## 2014-05-16 ENCOUNTER — Encounter (HOSPITAL_COMMUNITY): Payer: Self-pay | Admitting: Internal Medicine

## 2014-05-16 ENCOUNTER — Inpatient Hospital Stay (HOSPITAL_COMMUNITY): Payer: Medicare Other

## 2014-05-16 DIAGNOSIS — J441 Chronic obstructive pulmonary disease with (acute) exacerbation: Secondary | ICD-10-CM | POA: Diagnosis present

## 2014-05-16 DIAGNOSIS — E039 Hypothyroidism, unspecified: Secondary | ICD-10-CM

## 2014-05-16 DIAGNOSIS — M549 Dorsalgia, unspecified: Secondary | ICD-10-CM

## 2014-05-16 DIAGNOSIS — E43 Unspecified severe protein-calorie malnutrition: Secondary | ICD-10-CM | POA: Diagnosis present

## 2014-05-16 DIAGNOSIS — G8929 Other chronic pain: Secondary | ICD-10-CM | POA: Diagnosis present

## 2014-05-16 DIAGNOSIS — K7469 Other cirrhosis of liver: Secondary | ICD-10-CM

## 2014-05-16 DIAGNOSIS — E1142 Type 2 diabetes mellitus with diabetic polyneuropathy: Secondary | ICD-10-CM

## 2014-05-16 DIAGNOSIS — R627 Adult failure to thrive: Secondary | ICD-10-CM | POA: Diagnosis present

## 2014-05-16 DIAGNOSIS — D61818 Other pancytopenia: Secondary | ICD-10-CM | POA: Diagnosis present

## 2014-05-16 DIAGNOSIS — I5033 Acute on chronic diastolic (congestive) heart failure: Secondary | ICD-10-CM

## 2014-05-16 DIAGNOSIS — K7460 Unspecified cirrhosis of liver: Secondary | ICD-10-CM | POA: Diagnosis present

## 2014-05-16 HISTORY — DX: Acute on chronic diastolic (congestive) heart failure: I50.33

## 2014-05-16 LAB — TSH: TSH: 3.475 u[IU]/mL (ref 0.350–4.500)

## 2014-05-16 LAB — COMPREHENSIVE METABOLIC PANEL
ALK PHOS: 273 U/L — AB (ref 39–117)
ALT: 24 U/L (ref 0–35)
AST: 38 U/L — ABNORMAL HIGH (ref 0–37)
Albumin: 2.7 g/dL — ABNORMAL LOW (ref 3.5–5.2)
Anion gap: 9 (ref 5–15)
BUN: 16 mg/dL (ref 6–23)
CO2: 26 mmol/L (ref 19–32)
Calcium: 7.8 mg/dL — ABNORMAL LOW (ref 8.4–10.5)
Chloride: 87 mmol/L — ABNORMAL LOW (ref 96–112)
Creatinine, Ser: 0.99 mg/dL (ref 0.50–1.10)
GFR calc Af Amer: 70 mL/min — ABNORMAL LOW (ref >90)
GFR calc non Af Amer: 61 mL/min — ABNORMAL LOW (ref >90)
GLUCOSE: 125 mg/dL — AB (ref 70–99)
POTASSIUM: 4.4 mmol/L (ref 3.5–5.1)
Sodium: 122 mmol/L — ABNORMAL LOW (ref 135–145)
TOTAL PROTEIN: 5.8 g/dL — AB (ref 6.0–8.3)
Total Bilirubin: 0.6 mg/dL (ref 0.3–1.2)

## 2014-05-16 LAB — CBC
HCT: 28.9 % — ABNORMAL LOW (ref 36.0–46.0)
Hemoglobin: 9.6 g/dL — ABNORMAL LOW (ref 12.0–15.0)
MCH: 31.7 pg (ref 26.0–34.0)
MCHC: 33.2 g/dL (ref 30.0–36.0)
MCV: 95.4 fL (ref 78.0–100.0)
PLATELETS: DECREASED 10*3/uL (ref 150–400)
RBC: 3.03 MIL/uL — ABNORMAL LOW (ref 3.87–5.11)
RDW: 14 % (ref 11.5–15.5)
WBC: 2.8 10*3/uL — ABNORMAL LOW (ref 4.0–10.5)

## 2014-05-16 LAB — BLOOD GAS, ARTERIAL
ACID-BASE EXCESS: 4.4 mmol/L — AB (ref 0.0–2.0)
Bicarbonate: 28.9 mEq/L — ABNORMAL HIGH (ref 20.0–24.0)
DRAWN BY: 103701
O2 CONTENT: 2 L/min
O2 SAT: 93 %
PATIENT TEMPERATURE: 98.6
TCO2: 26.7 mmol/L (ref 0–100)
pCO2 arterial: 45 mmHg (ref 35.0–45.0)
pH, Arterial: 7.423 (ref 7.350–7.450)
pO2, Arterial: 64.5 mmHg — ABNORMAL LOW (ref 80.0–100.0)

## 2014-05-16 LAB — GLUCOSE, CAPILLARY
Glucose-Capillary: 149 mg/dL — ABNORMAL HIGH (ref 70–99)
Glucose-Capillary: 161 mg/dL — ABNORMAL HIGH (ref 70–99)
Glucose-Capillary: 158 mg/dL — ABNORMAL HIGH (ref 70–99)

## 2014-05-16 LAB — CK: Total CK: 38 U/L (ref 7–177)

## 2014-05-16 LAB — BRAIN NATRIURETIC PEPTIDE: B Natriuretic Peptide: 282.5 pg/mL — ABNORMAL HIGH (ref 0.0–100.0)

## 2014-05-16 MED ORDER — SPIRONOLACTONE 25 MG PO TABS
25.0000 mg | ORAL_TABLET | Freq: Every day | ORAL | Status: DC
Start: 2014-05-16 — End: 2014-05-18
  Administered 2014-05-16 – 2014-05-18 (×3): 25 mg via ORAL
  Filled 2014-05-16 (×3): qty 1

## 2014-05-16 MED ORDER — FUROSEMIDE 10 MG/ML IJ SOLN
40.0000 mg | Freq: Once | INTRAMUSCULAR | Status: AC
  Administered 2014-05-16: 40 mg via INTRAVENOUS
  Filled 2014-05-16: qty 4

## 2014-05-16 MED ORDER — VANCOMYCIN HCL IN DEXTROSE 1-5 GM/200ML-% IV SOLN
1000.0000 mg | Freq: Two times a day (BID) | INTRAVENOUS | Status: DC
Start: 2014-05-16 — End: 2014-05-16
  Filled 2014-05-16: qty 200

## 2014-05-16 MED ORDER — GLUCERNA SHAKE PO LIQD
237.0000 mL | Freq: Three times a day (TID) | ORAL | Status: DC
Start: 2014-05-16 — End: 2014-05-18
  Administered 2014-05-16 – 2014-05-18 (×6): 237 mL via ORAL
  Filled 2014-05-16 (×8): qty 237

## 2014-05-16 MED ORDER — LACTULOSE 10 GM/15ML PO SOLN
20.0000 g | Freq: Two times a day (BID) | ORAL | Status: DC
  Administered 2014-05-16 – 2014-05-18 (×5): 20 g via ORAL
  Filled 2014-05-16 (×5): qty 30

## 2014-05-16 MED ORDER — BUMETANIDE 1 MG PO TABS
1.0000 mg | ORAL_TABLET | Freq: Every day | ORAL | Status: DC
  Administered 2014-05-17 – 2014-05-18 (×2): 1 mg via ORAL
  Filled 2014-05-16 (×2): qty 1

## 2014-05-16 MED ORDER — DEXTROSE 5 % IV SOLN
1.0000 g | Freq: Three times a day (TID) | INTRAVENOUS | Status: DC
  Administered 2014-05-16: 1 g via INTRAVENOUS
  Filled 2014-05-16 (×2): qty 1

## 2014-05-16 NOTE — Progress Notes (Signed)
INITIAL NUTRITION ASSESSMENT  DOCUMENTATION CODES Per approved criteria  -Severe malnutrition in the context of acute illness or injury -Obesity unspecified  Pt meets criteria for severe MALNUTRITION in the context of acute illness as evidenced by energy intake <50% for >/=5 days and 18% weight loss x 2 months.  INTERVENTION: -Provide Glucerna Shake po TID, each supplement provides 220 kcal and 10 grams of protein -Provide Magic cup TID with meals, each supplement provides 290 kcal and 9 grams of protein -Placed pt on order with assist -Encourage PO intake -RD to continue to monitor PO intake and supplement acceptance  NUTRITION DIAGNOSIS: Malnutrition related to failure to thrive as evidenced by energy intake <50% for >/=5 days and 18% weight loss x 2 months.  Goal: Pt to meet >/= 90% of their estimated nutrition needs   Monitor:  PO and supplemental intake, weight, labs, I/O's  Reason for Assessment: Pt identified as at nutrition risk on the Malnutrition Screen Tool  Admitting Dx: Failure to thrive in adult and weakness  ASSESSMENT:  59 y.o. female with a PMH of cirrhosis, COPD, hypertension and depression, recent hospitalization 05/04/14-05/09/14 for evaluation of altered mental status and hypotension secondary to HCAP. She has not been able to eat and she states her appetite has been very poor.   Pt in room with brother at bedside. Per pt's brother, pt has not been communicating that well. Pt was able to state her UBW but RD was unable to get much information from her. Pt's brother states that after her discharge from [facility], pt fell twice. She has not been eating.  When asked what she would like for lunch, pt answers "nothing". Pt is not feeling hungry. Pt's brother does not trust pt to order meals for herself. RD to place pt on order with assist.   Pt has lost 40 lb since 1/21 (18% weight loss x 2 months, significant for time frame).  Nutrition focused physical exam shows no sign  of depletion of muscle mass or body fat.  Discussed nutritional supplementation options with pt's brother. Wants pt to try Glucerna shakes and magic cups. RD to order.  Labs reviewed: Low Na  Height: Ht Readings from Last 1 Encounters:  05/15/14 5\' 4"  (1.626 m)    Weight: Wt Readings from Last 1 Encounters:  05/15/14 182 lb 8.7 oz (82.8 kg)    Ideal Body Weight: 120 lb  % Ideal Body Weight: 152%  Wt Readings from Last 10 Encounters:  05/15/14 182 lb 8.7 oz (82.8 kg)  05/08/14 198 lb 7.7 oz (90.03 kg)  03/15/14 222 lb (100.699 kg)    Usual Body Weight: 215 lb -per pt  % Usual Body Weight: 85%  BMI:  Body mass index is 31.32 kg/(m^2).  Estimated Nutritional Needs: Kcal: 1600-1800 Protein: 75-85g Fluid: 1.7L/day  Skin: intact  Diet Order: Diet Carb Modified  EDUCATION NEEDS: -No education needs identified at this time   Intake/Output Summary (Last 24 hours) at 05/16/14 0909 Last data filed at 05/16/14 0700  Gross per 24 hour  Intake 609.17 ml  Output   1500 ml  Net -890.83 ml    Last BM: 3/18  Labs:   Recent Labs Lab 05/15/14 1729 05/15/14 2305 05/16/14 0055  NA 126*  --  122*  K 4.4  --  4.4  CL 90*  --  87*  CO2 27  --  26  BUN 18  --  16  CREATININE 1.07 1.02 0.99  CALCIUM 8.3*  --  7.8*  GLUCOSE 95  --  125*    CBG (last 3)   Recent Labs  05/15/14 2326 05/16/14 0735  GLUCAP 101* 149*    Scheduled Meds: . allopurinol  100 mg Oral BID  . ARIPiprazole  5 mg Oral Daily  . atorvastatin  10 mg Oral q1800  . [START ON 05/19/2014] buprenorphine  10 mcg Transdermal Weekly  . ceFEPime (MAXIPIME) IV  2 g Intravenous 3 times per day  . clonazePAM  0.5 mg Oral Daily  . docusate sodium  100 mg Oral BID  . folic acid  1 mg Oral Daily  . gabapentin  300 mg Oral QHS  . heparin  5,000 Units Subcutaneous 3 times per day  . ipratropium-albuterol  3 mL Nebulization BID  . lamoTRIgine  150 mg Oral QHS  . levothyroxine  75 mcg Oral QAC  breakfast  . linagliptin  5 mg Oral Daily  . magnesium oxide  400 mg Oral BID  . metFORMIN  1,000 mg Oral Q breakfast  . metoprolol tartrate  25 mg Oral BID  . montelukast  10 mg Oral QHS  . multivitamin with minerals  1 tablet Oral Daily  . pantoprazole  40 mg Oral Daily  . sertraline  100 mg Oral QHS  . sodium chloride  3 mL Intravenous Q12H  . thiamine  100 mg Oral Daily  . vancomycin  750 mg Intravenous BID    Continuous Infusions: . sodium chloride 50 mL/hr at 05/15/14 2349    Past Medical History  Diagnosis Date  . GERD (gastroesophageal reflux disease)   . Colon polyp   . Cirrhosis of liver   . Hypertension   . Depression   . COPD (chronic obstructive pulmonary disease)   . IBS (irritable bowel syndrome)   . On home oxygen therapy     "2L; 24/7" (05/04/2014)  . Family history of adverse reaction to anesthesia     "my sister doesn't wake up good when she's put deep to sleep"  . High cholesterol   . Heart murmur   . CHF (congestive heart failure)   . Asthma   . Pneumonia "several times"  . Chronic bronchitis     "get it pretty much q yr"   . Sleep apnea     "can't tolerate CPAP" (05/04/2014)  . Type II diabetes mellitus   . History of blood transfusion     "related to my anemia"  . Anemia   . Iron deficiency anemia   . Headache     "more than a couple times/wk" (05/04/2014)  . Arthritis     "some in my feet" (05/04/2014)  . Chronic lower back pain   . Anxiety     Past Surgical History  Procedure Laterality Date  . Cesarean section  1979  . Cataract extraction w/ intraocular lens  implant, bilateral Bilateral 2005  . Back surgery    . Colonoscopy  2014  . Upper gastrointestinal endoscopy    . Posterior fusion cervical spine  2015    "rebuilt 3 of my neck vertebrae"  . Incision and drainage of wound Right 2009    "leg mauled  by dog"  . Tubal ligation  1979  . Dilation and curettage of uterus      Clayton Bibles, MS, RD, LDN Pager: [phone] After  Hours Pager: [phone]

## 2014-05-16 NOTE — Evaluation (Signed)
Physical Therapy Evaluation Patient Details Name: Samantha Mcmillan MRN: [id number] DOB: [birth year] Today's Date: 05/16/2014   History of Present Illness  Pt is a 59 y/o F w/ PMHx of DM type II, HTN, COPD, chronic back pain, and cirrhosis admitted  05/15/14  with progressive weakness , DC from [facility] 05/09/14 after admission for sepsis.  Clinical Impression  Patient is able to get up and  Walk  5 ' to recliner. Sats on 2 l. 100%. Assisted back to bed as order for ABG's placed and RN requested. Patient will benefit from PT to address [problems listed in note below.    Follow Up Recommendations SNF - has not had success at home after last hospital stay.   Equipment Recommendations  None recommended by PT    Recommendations for Other Services       Precautions / Restrictions Precautions Precautions: Fall Precaution Comments: on oxygen      Mobility  Bed Mobility   Bed Mobility: Supine to Sit     Supine to sit: Min assist;HOB elevated     General bed mobility comments: extra time, use of rail, able to get legs over, assist to get legs onto bed.  Transfers Overall transfer level: Needs assistance Equipment used: Rolling walker (2 wheeled) Transfers: Sit to/from Stand Sit to Stand: Mod assist;+2 safety/equipment;From elevated surface Stand pivot transfers: +2 safety/equipment;Mod assist       General transfer comment: staedy assist to rise from bed and recliner, cyues for hand placement. pivot steps from bed to recliner and back to bed with use of RW  Ambulation/Gait                Stairs            Wheelchair Mobility    Modified Rankin (Stroke Patients Only)       Balance                                             Pertinent Vitals/Pain Pain Assessment: 0-10 Pain Score: 2  Pain Location: abdomen  Pain Descriptors / Indicators: Tightness Pain Intervention(s): Monitored during session    Home Living Family/patient  expects to be discharged to:: Private residence Living Arrangements: Non-relatives/Friends Available Help at Discharge: Family;Available 24 hours/day Type of Home: House Home Access: Stairs to enter Entrance Stairs-Rails: Right;Left;Can reach both   Home Layout: One level Home Equipment: Environmental consultant - 2 wheels;Wheelchair - manual;Bedside commode      Prior Function Level of Independence: Needs assistance   Gait / Transfers Assistance Needed: has barely been able to get OOB.           Hand Dominance        Extremity/Trunk Assessment   Upper Extremity Assessment: Generalized weakness           Lower Extremity Assessment: Generalized weakness      Cervical / Trunk Assessment: Normal  Communication   Communication: No difficulties  Cognition Arousal/Alertness: Awake/alert Behavior During Therapy: WFL for tasks assessed/performed Overall Cognitive Status: Within Functional Limits for tasks assessed                      General Comments      Exercises        Assessment/Plan    PT Assessment Patient needs continued PT services  PT Diagnosis Difficulty walking;Generalized weakness   PT  Problem List Decreased strength;Decreased range of motion;Decreased activity tolerance;Decreased balance;Decreased mobility;Decreased knowledge of use of DME;Decreased safety awareness;Decreased knowledge of precautions  PT Treatment Interventions DME instruction;Gait training;Functional mobility training;Therapeutic activities;Therapeutic exercise;Patient/family education   PT Goals (Current goals can be found in the Care Plan section) Acute Rehab PT Goals Patient Stated Goal: to go home and walk PT Goal Formulation: With patient/family Time For Goal Achievement: 05/30/14 Potential to Achieve Goals: Good    Frequency Min 3X/week   Barriers to discharge Decreased caregiver support      Co-evaluation               End of Session Equipment Utilized During  Treatment: Gait belt;Oxygen Activity Tolerance: Patient tolerated treatment well Patient left: in bed;with call bell/phone within reach;with family/visitor present Nurse Communication: Mobility status         Time: 8588-5027 PT Time Calculation (min) (ACUTE ONLY): 32 min   Charges:   PT Evaluation $Initial PT Evaluation Tier I: 1 Procedure PT Treatments $Gait Training: 8-22 mins   PT G Codes:        Claretha Cooper 05/16/2014, 4:39 PM Tresa Endo PT [phone]

## 2014-05-16 NOTE — Progress Notes (Signed)
ANTIBIOTIC CONSULT NOTE - FOLLOW UP  Pharmacy Consult for Vancomycin, Cefepime Indication: pneumonia  Allergies  Allergen Reactions  . Iodine Shortness Of Breath  . Latex Anaphylaxis  . Oxycodone Shortness Of Breath  . Percocet [Oxycodone-Acetaminophen] Shortness Of Breath  . Shellfish Allergy Anaphylaxis  . Amitriptyline Other (See Comments)    Mental changes  . Wellbutrin [Bupropion] Other (See Comments)    Mental changes  . Augmentin [Amoxicillin-Pot Clavulanate] Rash  . Codeine Rash  . Cortisone Rash  . Diphenhydramine Rash  . Other Rash    darvocet  . Vicodin [Hydrocodone-Acetaminophen] Rash    dizzy    Patient Measurements: Height: 5\' 4"  (162.6 cm) Weight: 182 lb 8.7 oz (82.8 kg) IBW/kg (Calculated) : 54.7  Vital Signs:   Intake/Output from previous day: 03/22 0701 - 03/23 0700 In: 609.2 [I.V.:359.2; IV Piggyback:250] Out: 1500 [Urine:1500]  Labs:  Recent Labs  05/15/14 1729 05/15/14 1845 05/15/14 2305 05/16/14 0055  WBC  --  3.0* 3.1* 2.8*  HGB  --  8.9* 9.0* 9.6*  PLT  --  142* 129* PLATELET CLUMPS NOTED ON SMEAR, COUNT APPEARS DECREASED  CREATININE 1.07  --  1.02 0.99   Estimated Creatinine Clearance: 62.9 mL/min (by C-G formula based on Cr of 0.99). No results for input(s): VANCOTROUGH, VANCOPEAK, VANCORANDOM, GENTTROUGH, GENTPEAK, GENTRANDOM, TOBRATROUGH, TOBRAPEAK, TOBRARND, AMIKACINPEAK, AMIKACINTROU, AMIKACIN in the last 72 hours.    Assessment: 59 y.o. female presents with weakness and inability to ambulate. PMH includes cirrhosis, COPD, HTN, depression and recent hospitalization (3/11-3/16) at [facility] for HCAP and Ecoli UTI. She was discharged on ceftin/azithromycin, but has had failure to thrive. Pharmacy is now consulted to dose vancomycin and cefepime HCAP.  05/16/2014 >> vanc >> 05/16/2014 >> cefepime >>  Today, 05/16/2014:  Tmax: afebrile  WBCs: 2.8  Renal: SCr 0.99, CrCl ~ 63 ml/min    Blood culture in process   Goal of Therapy:   Vancomycin trough level 15-20 mcg/ml  Plan:   Cefepime 1g IV q8h  Vancomycin 1g IV q12h.  Measure Vanc trough at steady state.  Follow up renal fxn, culture results, and clinical course.   Gretta Arab PharmD, BCPS Pager [phone] 05/16/2014 1:29 PM

## 2014-05-16 NOTE — Care Management Note (Signed)
    Page 1 of 1   05/16/2014     11:33:38 AM CARE MANAGEMENT NOTE 05/16/2014  Patient:  Samantha Mcmillan, Samantha Mcmillan   Account Number:  [id number]  Date Initiated:  05/16/2014  Documentation initiated by:  Dessa Phi  Subjective/Objective Assessment:   59 y/o f admitted w/PNA.Readmit-3/11-3/16.     Action/Plan:   From home.   Anticipated DC Date:  05/21/2014   Anticipated DC Plan:  [place]  CM consult      Choice offered to / List presented to:             Status of service:  In process, will continue to follow Medicare Important Message given?   (If response is "NO", the following Medicare IM given date fields will be blank) Date Medicare IM given:   Medicare IM given by:   Date Additional Medicare IM given:   Additional Medicare IM given by:    Discharge Disposition:    Per UR Regulation:  Reviewed for med. necessity/level of care/duration of stay  If discussed at [place] of Stay Meetings, dates discussed:    Comments:  05/16/14 Dessa Phi RN BSN NCM [phone] Pt cons-await recommendations.

## 2014-05-16 NOTE — Progress Notes (Signed)
ANTIBIOTIC CONSULT NOTE - INITIAL  Pharmacy Consult for vancomycin, cefepime Indication: HCAP  Allergies  Allergen Reactions  . Iodine Shortness Of Breath  . Latex Anaphylaxis  . Oxycodone Shortness Of Breath  . Percocet [Oxycodone-Acetaminophen] Shortness Of Breath  . Shellfish Allergy Anaphylaxis  . Amitriptyline Other (See Comments)    Mental changes  . Wellbutrin [Bupropion] Other (See Comments)    Mental changes  . Augmentin [Amoxicillin-Pot Clavulanate] Rash  . Codeine Rash  . Cortisone Rash  . Diphenhydramine Rash  . Other Rash    darvocet  . Vicodin [Hydrocodone-Acetaminophen] Rash    dizzy    Patient Measurements: Height: 5\' 4"  (162.6 cm) Weight: 182 lb 8.7 oz (82.8 kg) IBW/kg (Calculated) : 54.7 Adjusted Body Weight:   Vital Signs: Temp: 97.9 F (36.6 C) (03/22 2248) Temp Source: Oral (03/22 2248) BP: 142/60 mmHg (03/22 2248) Pulse Rate: 67 (03/22 2248) Intake/Output from previous day: 03/22 0701 - 03/23 0700 In: 200 [IV Piggyback:200] Out: 1500 [Urine:1500] Intake/Output from this shift: Total I/O In: 200 [IV Piggyback:200] Out: 1500 [Urine:1500]  Labs:  Recent Labs  05/15/14 1729 05/15/14 1845 05/15/14 2305 05/16/14 0055  WBC  --  3.0* 3.1* 2.8*  HGB  --  8.9* 9.0* 9.6*  PLT  --  142* 129* PLATELET CLUMPS NOTED ON SMEAR, COUNT APPEARS DECREASED  CREATININE 1.07  --  1.02 0.99   Estimated Creatinine Clearance: 62.9 mL/min (by C-G formula based on Cr of 0.99). No results for input(s): VANCOTROUGH, VANCOPEAK, VANCORANDOM, GENTTROUGH, GENTPEAK, GENTRANDOM, TOBRATROUGH, TOBRAPEAK, TOBRARND, AMIKACINPEAK, AMIKACINTROU, AMIKACIN in the last 72 hours.   Microbiology: Recent Results (from the past 720 hour(s))  Blood culture (routine x 2)     Status: None   Collection Time: 05/04/14 11:40 AM  Result Value Ref Range Status   Specimen Description BLOOD RIGHT WRIST  Final   Special Requests BOTTLES DRAWN AEROBIC AND ANAEROBIC 5MLS  Final   Culture    Final    NO GROWTH 5 DAYS Performed at [facility]    Report Status 05/10/2014 FINAL  Final  Blood culture (routine x 2)     Status: None   Collection Time: 05/04/14 12:19 PM  Result Value Ref Range Status   Specimen Description BLOOD BLOOD RIGHT FOREARM  Final   Special Requests BOTTLES DRAWN AEROBIC AND ANAEROBIC 5CC  Final   Culture   Final    NO GROWTH 5 DAYS Performed at [facility]    Report Status 05/10/2014 FINAL  Final  Urine culture     Status: None   Collection Time: 05/04/14  1:37 PM  Result Value Ref Range Status   Specimen Description URINE, RANDOM  Final   Special Requests Normal  Final   Colony Count   Final    >=100,000 COLONIES/ML Performed at [facility]    Culture   Final    ESCHERICHIA COLI Performed at [facility]    Report Status 05/07/2014 FINAL  Final   Organism ID, Bacteria ESCHERICHIA COLI  Final      Susceptibility   Escherichia coli - MIC*    AMPICILLIN <=2 SENSITIVE Sensitive     CEFAZOLIN <=4 SENSITIVE Sensitive     CEFTRIAXONE <=1 SENSITIVE Sensitive     CIPROFLOXACIN <=0.25 SENSITIVE Sensitive     GENTAMICIN <=1 SENSITIVE Sensitive     LEVOFLOXACIN <=0.12 SENSITIVE Sensitive     NITROFURANTOIN <=16 SENSITIVE Sensitive     TOBRAMYCIN <=1 SENSITIVE Sensitive     TRIMETH/SULFA <=  20 SENSITIVE Sensitive     PIP/TAZO <=4 SENSITIVE Sensitive     * ESCHERICHIA COLI    Medical History: Past Medical History  Diagnosis Date  . GERD (gastroesophageal reflux disease)   . Colon polyp   . Cirrhosis of liver   . Hypertension   . Depression   . COPD (chronic obstructive pulmonary disease)   . IBS (irritable bowel syndrome)   . On home oxygen therapy     "2L; 24/7" (05/04/2014)  . Family history of adverse reaction to anesthesia     "my sister doesn't wake up good when she's put deep to sleep"  . High cholesterol   . Heart murmur   . CHF (congestive heart failure)   . Asthma   . Pneumonia "several  times"  . Chronic bronchitis     "get it pretty much q yr"   . Sleep apnea     "can't tolerate CPAP" (05/04/2014)  . Type II diabetes mellitus   . History of blood transfusion     "related to my anemia"  . Anemia   . Iron deficiency anemia   . Headache     "more than a couple times/wk" (05/04/2014)  . Arthritis     "some in my feet" (05/04/2014)  . Chronic lower back pain   . Anxiety     Medications:  Anti-infectives    Start     Dose/Rate Route Frequency Ordered Stop   05/15/14 2345  vancomycin (VANCOCIN) IVPB 750 mg/150 ml premix     750 mg 150 mL/hr over 60 Minutes Intravenous 2 times daily 05/15/14 2342     05/15/14 2330  ceFEPIme (MAXIPIME) 2 g in dextrose 5 % 50 mL IVPB     2 g 100 mL/hr over 30 Minutes Intravenous 3 times per day 05/15/14 2329 05/23/14 2159     Assessment: Patient with weakness and HCAP.    Goal of Therapy:  Vancomycin trough level 15-20 mcg/ml  Cefepime dosed based on patient weight and renal function   Plan:  Measure antibiotic drug levels at steady state Follow up culture results vancomycin 750mg  iv q12hr, cefepime 2gm iv q8hr  Tyler Deis, Shekela Goodridge Crowford 05/16/2014,4:51 AM

## 2014-05-16 NOTE — Progress Notes (Addendum)
Progress Note   Samantha Mcmillan IFO:[id number] DOB: 1955/06/02 DOA: 05/15/2014 PCP: Casilda Carls, MD   Brief Narrative:   Samantha Mcmillan is an 59 y.o. female with a PMH of cirrhosis, COPD, hypertension and depression, recent hospitalization 05/04/14-05/09/14 for evaluation of altered mental status and hypotension secondary to HCAP and Escherichia coli UTI, discharged on Ceftin/azithromycin who was readmitted 05/15/14 with a chief complaint of weakness and inability to ambulate, poor appetite and failure to thrive. Labs were significant for mild pancytopenia and mild hyponatremia. She was hemodynamically stable. Chest x-ray showed patchy airspace opacity is favoring edema.  Assessment/Plan:   Principal Problem:   Failure to thrive in adult and weakness in the setting of recent treatment for HCAP and ongoing abnormal CXR - Given findings on chest x-ray and recent hospitalization with sepsis secondary to HCAP, she was placed on empiric antibiotics.  CXR findings more consistent with fluids and has already received adequate treatment for infection, D/C antibiotics. - Check BNP.  2-D Echo done at [facility] (report in care everywhere) 03/28/14, EF > 67% with diastolic dysfunction noted. - Obtain MRI brain given complaints of left-sided paresthesias. - Start lactulose for possible hepatic encephalopathy. Ammonia not elevated, but this does not correlate with encephalopathy. - Given treatment with statin, check CK levels rule out myopathy. - Questionable polypharmacy. Discontinue Neurontin and when necessary Flexeril and hydromorphone.  Active Problems:   Acute on chronic diastolic CHF - Diuresed during February hospital visit at [facility].  D/C'd home on Bumex. - Hydrated during most recent hospitalization here, likely contributing to acute diastolic CHF. - Will diurese and monitor renal function. - Check daily weights. - Resume Spironolactone and Bumex.    Chronic back pain - Appears to be on high  dose chronic narcotics.  D/C hydromorphone. - On buprenorphine patch.    Severe malnutrition - Pt meets criteria for severe MALNUTRITION in the context of acute illness as evidenced by energy intake <50% for >/=5 days and 18% weight loss x 2 months. - Supplements ordered per dietician recommendations.    Hyperlipidemia - Continue statin.chronic     Depression/anxiety/PTSD/panic attacks/OCD - Continue Abilify, Zoloft and Klonopin.    Chronic respiratory failure secondary to advanced COPD - Continue supplemental oxygen. - Continue Symbicort. - Check ABG rule out CO2 narcosis.    Hypothyroidism - Continue Synthroid. TSH WNL.    DM2 (diabetes mellitus, type 2) - Currently on metformin and Trajenta. - CBGs 101-180.    Hyponatremia - Likely secondary to cirrhosis or CHF physiology. Follow-up echocardiogram.    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 - Monitor blood counts. - Was hospitalized 03/26/14-03/31/14 at [facility] for diuresis.    DVT Prophylaxis - Continue heparin.  Code Status: Full. Family Communication: No family at the bedside. Lives with ex-husband. Salli Quarry who was updated by telephone.   Disposition Plan: Home versus SNF when stable.   IV Access:    Peripheral IV   Procedures and diagnostic studies:   Dg Chest 2 View 05/15/2014: Patchy bilateral airspace opacity representing pulmonary edema or multifocal infection. Favor edema. Minimal improvement.     Medical Consultants:    None.  Anti-Infectives:    Cefepime 05/15/14--->05/16/14  Vancomycin 05/15/14--->05/16/14  Subjective:   Samantha Mcmillan seems weak and seems confused. She reports some left-sided numbness. She is very slow to respond to questions. Seems to have some word finding difficulty. Has a blood blister on her left anterior lower leg which  she says she received when she fell.  Her ex-husband tells me she is more  sedated than normal.  Objective:    Filed Vitals:   05/15/14 2248 05/15/14 2301 05/16/14 0446 05/16/14 1032  BP: 142/60     Pulse: 67     Temp: 97.9 F (36.6 C)     TempSrc: Oral     Resp: 16     Height:  5\' 4"  (1.626 m)    Weight:  82.8 kg (182 lb 8.7 oz)    SpO2: 100%  99% 95%    Intake/Output Summary (Last 24 hours) at 05/16/14 1043 Last data filed at 05/16/14 0700  Gross per 24 hour  Intake 609.17 ml  Output   1500 ml  Net -890.83 ml    Exam: Gen:  Weak with psychomotor retardation Cardiovascular:  RRR, No M/R/G Respiratory:  Lungs CTAB Gastrointestinal:  Abdomen soft, NT/ND, + BS Extremities:  Bilateral erythema with a blood blister left anterior lower leg 2 cm   Data Reviewed:    Labs: Basic Metabolic Panel:  Recent Labs Lab 05/15/14 1729 05/15/14 2305 05/16/14 0055  NA 126*  --  122*  K 4.4  --  4.4  CL 90*  --  87*  CO2 27  --  26  GLUCOSE 95  --  125*  BUN 18  --  16  CREATININE 1.07 1.02 0.99  CALCIUM 8.3*  --  7.8*   GFR Estimated Creatinine Clearance: 62.9 mL/min (by C-G formula based on Cr of 0.99). Liver Function Tests:  Recent Labs Lab 05/15/14 1729 05/16/14 0055  AST 37 38*  ALT 26 24  ALKPHOS 292* 273*  BILITOT 0.5 0.6  PROT 6.1 5.8*  ALBUMIN 2.9* 2.7*   CBC:  Recent Labs Lab 05/15/14 1845 05/15/14 2305 05/16/14 0055  WBC 3.0* 3.1* 2.8*  NEUTROABS 1.9  --   --   HGB 8.9* 9.0* 9.6*  HCT 26.8* 26.9* 28.9*  MCV 95.4 96.1 95.4  PLT 142* 129* PLATELET CLUMPS NOTED ON SMEAR, COUNT APPEARS DECREASED  CBG:  Recent Labs Lab 05/09/14 1153 05/15/14 2326 05/16/14 0735  GLUCAP 180* 101* 149*   Thyroid function studies:  Recent Labs  05/15/14 2305  TSH 3.475   Microbiology No results found for this or any previous visit (from the past 240 hour(s)).   Medications:   . allopurinol  100 mg Oral BID  . ARIPiprazole  5 mg Oral Daily  . atorvastatin  10 mg Oral q1800  . [START ON 05/19/2014] buprenorphine  10 mcg  Transdermal Weekly  . ceFEPime (MAXIPIME) IV  2 g Intravenous 3 times per day  . clonazePAM  0.5 mg Oral Daily  . docusate sodium  100 mg Oral BID  . folic acid  1 mg Oral Daily  . gabapentin  300 mg Oral QHS  . heparin  5,000 Units Subcutaneous 3 times per day  . ipratropium-albuterol  3 mL Nebulization BID  . lactulose  20 g Oral BID  . lamoTRIgine  150 mg Oral QHS  . levothyroxine  75 mcg Oral QAC breakfast  . linagliptin  5 mg Oral Daily  . magnesium oxide  400 mg Oral BID  . metFORMIN  1,000 mg Oral Q breakfast  . metoprolol tartrate  25 mg Oral BID  . montelukast  10 mg Oral QHS  . multivitamin with minerals  1 tablet Oral Daily  . pantoprazole  40 mg Oral Daily  . sertraline  100 mg Oral QHS  .  sodium chloride  3 mL Intravenous Q12H  . thiamine  100 mg Oral Daily  . vancomycin  750 mg Intravenous BID   Continuous Infusions: . sodium chloride 50 mL/hr at 05/15/14 2349    Time spent: 35 minutes.  The patient is medically complex and requires high complexity decision making.    LOS: 1 day   RAMA,CHRISTINA  [facility] Pager [phone]. If unable to reach me by pager, please call my cell phone at [phone].  *Please refer to [email or web address], password [email or web address] to get updated schedule on who will round on this patient, as hospitalists switch teams weekly. If 7PM-7AM, please contact night-coverage at [email or web address], password [email or web address] for any overnight needs.  05/16/2014, 10:43 AM

## 2014-05-17 DIAGNOSIS — E43 Unspecified severe protein-calorie malnutrition: Secondary | ICD-10-CM

## 2014-05-17 DIAGNOSIS — G8929 Other chronic pain: Secondary | ICD-10-CM

## 2014-05-17 DIAGNOSIS — J449 Chronic obstructive pulmonary disease, unspecified: Secondary | ICD-10-CM

## 2014-05-17 DIAGNOSIS — I5033 Acute on chronic diastolic (congestive) heart failure: Secondary | ICD-10-CM

## 2014-05-17 DIAGNOSIS — M549 Dorsalgia, unspecified: Secondary | ICD-10-CM

## 2014-05-17 LAB — BASIC METABOLIC PANEL
Anion gap: 10 (ref 5–15)
BUN: 16 mg/dL (ref 6–23)
CALCIUM: 8.6 mg/dL (ref 8.4–10.5)
CHLORIDE: 93 mmol/L — AB (ref 96–112)
CO2: 29 mmol/L (ref 19–32)
CREATININE: 0.86 mg/dL (ref 0.50–1.10)
GFR calc Af Amer: 83 mL/min — ABNORMAL LOW (ref >90)
GFR, EST NON AFRICAN AMERICAN: 72 mL/min — AB (ref >90)
GLUCOSE: 131 mg/dL — AB (ref 70–99)
Potassium: 4.2 mmol/L (ref 3.5–5.1)
Sodium: 132 mmol/L — ABNORMAL LOW (ref 135–145)

## 2014-05-17 LAB — CBC
HEMATOCRIT: 29.3 % — AB (ref 36.0–46.0)
HEMOGLOBIN: 9.8 g/dL — AB (ref 12.0–15.0)
MCH: 32 pg (ref 26.0–34.0)
MCHC: 33.4 g/dL (ref 30.0–36.0)
MCV: 95.8 fL (ref 78.0–100.0)
Platelets: 125 10*3/uL — ABNORMAL LOW (ref 150–400)
RBC: 3.06 MIL/uL — AB (ref 3.87–5.11)
RDW: 14 % (ref 11.5–15.5)
WBC: 4.2 10*3/uL (ref 4.0–10.5)

## 2014-05-17 LAB — HEMOGLOBIN A1C
HEMOGLOBIN A1C: 5.3 % (ref 4.8–5.6)
Mean Plasma Glucose: 105 mg/dL

## 2014-05-17 LAB — GLUCOSE, CAPILLARY: Glucose-Capillary: 138 mg/dL — ABNORMAL HIGH (ref 70–99)

## 2014-05-17 MED ORDER — FUROSEMIDE 10 MG/ML IJ SOLN
40.0000 mg | Freq: Once | INTRAMUSCULAR | Status: AC
  Administered 2014-05-17: 40 mg via INTRAVENOUS
  Filled 2014-05-17: qty 4

## 2014-05-17 NOTE — Progress Notes (Signed)
Clinical Social Work  CSW received consult for [employer]. Pt's son, [name] at bedside who states that he will fill out the forms and contact CSW when complete. CSW also gave family bed offers for short term rehab. Family wants to come together to make a decision, and will notify CSW tomorrow when they have chosen a bed.   Glorious Peach BSW Intern

## 2014-05-17 NOTE — Progress Notes (Signed)
Progress Note   Samantha Mcmillan SEG:[id number] DOB: 07-10-1955 DOA: 05/15/2014 PCP: Casilda Carls, MD   Brief Narrative:   Samantha Mcmillan is an 59 y.o. female with a PMH of cirrhosis, COPD, hypertension and depression, recent hospitalization 05/04/14-05/09/14 for evaluation of altered mental status and hypotension secondary to HCAP and Escherichia coli UTI, discharged on Ceftin/azithromycin who was readmitted 05/15/14 with a chief complaint of weakness and inability to ambulate, poor appetite and failure to thrive. Labs were significant for mild pancytopenia and mild hyponatremia. She was hemodynamically stable. Chest x-ray showed patchy airspace opacity is favoring edema.  Assessment/Plan:   Principal Problem:   Failure to thrive in adult and weakness in the setting of recent treatment for HCAP and ongoing abnormal CXR - Given findings on chest x-ray and recent hospitalization with sepsis secondary to HCAP, she was placed on empiric antibiotics.  CXR findings more consistent with fluids and has already received adequate treatment for infection, D/C antibiotics. - Check BNP.  2-D Echo done at [facility] (report in care everywhere) 03/28/14, EF > 73% with diastolic dysfunction noted. - Unable to obtain MRI brain secondary to claustrophobic reaction. - Lactulose started 05/16/14 for possible hepatic encephalopathy. Ammonia not elevated, but this does not correlate with encephalopathy. - CK levels WNL, ruling out statin induced myopathy. - Questionable polypharmacy. Discontinued Neurontin and when necessary Flexeril and hydromorphone 05/16/14.  Active Problems:   Acute on chronic diastolic CHF - Diuresed during February hospital visit at [facility].  D/C'd home on Bumex. - Hydrated during most recent hospitalization here, likely contributing to acute diastolic CHF. - Renal function stable with diuresis. I/O- by 2.4 L. Weight down 1.7 kg. - Continue Spironolactone and Bumex.    Chronic back pain -  Appears to be on high dose chronic narcotics.  D/C hydromorphone. - On buprenorphine patch.    Severe malnutrition - Pt meets criteria for severe MALNUTRITION in the context of acute illness as evidenced by energy intake <50% for >/=5 days and 18% weight loss x 2 months. - Supplements ordered per dietician recommendations.    Hyperlipidemia - Continue statin.    Depression/anxiety/PTSD/panic attacks/OCD - Continue Abilify, Zoloft and Klonopin.    Chronic respiratory failure with hypoxia secondary to advanced COPD - Continue supplemental oxygen. - Continue Symbicort. - ABG showed pH 7.423, PCO2 45, and PO2 64.5.    Hypothyroidism - Continue Synthroid. TSH WNL.    DM2 (diabetes mellitus, type 2) - Currently on metformin and Trajenta. - CBGs 101-161.    Hyponatremia - Likely secondary to cirrhosis or CHF physiology.     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 - Monitor blood counts. - Was hospitalized 03/26/14-03/31/14 at [facility] for diuresis.    DVT Prophylaxis - Continue heparin.  Code Status: Full. Family Communication: No family at the bedside. Lives with ex-husband. Salli Quarry who was updated by telephone 05/16/14.   Disposition Plan: Home versus SNF when stable.   IV Access:    Peripheral IV   Procedures and diagnostic studies:   Dg Chest 2 View 05/15/2014: Patchy bilateral airspace opacity representing pulmonary edema or multifocal infection. Favor edema. Minimal improvement.     Medical Consultants:    None.  Anti-Infectives:    Cefepime 05/15/14--->05/16/14  Vancomycin 05/15/14--->05/16/14  Subjective:   Samantha Mcmillan is sitting up in the chair today.  Seems more alert.  Complains of some mild back pain.  Had some shortness of breath last night, says it's  not been as bad today.  No nausea or vomiting today but had some last night.  Had a small BM this a.m. And last p.m.     Objective:    Filed Vitals:   05/16/14 1954 05/16/14 2146 05/16/14 2345 05/17/14 0550  BP: 126/63  131/85 151/69  Pulse: 81   79  Temp: 97.3 F (36.3 C)   98 F (36.7 C)  TempSrc: Oral   Oral  Resp: 14   16  Height:      Weight:    81.1 kg (178 lb 12.7 oz)  SpO2: 94% 96%  94%    Intake/Output Summary (Last 24 hours) at 05/17/14 0803 Last data filed at 05/17/14 0700  Gross per 24 hour  Intake    650 ml  Output   3077 ml  Net  -2427 ml    Exam: Gen:  Weak but more alert Cardiovascular:  RRR, No M/R/G Respiratory:  Lungs CTAB Gastrointestinal:  Abdomen soft, NT/ND, + BS Extremities:  Bilateral erythema with a blood blister left anterior lower leg 2 cm   Data Reviewed:    Labs: Basic Metabolic Panel:  Recent Labs Lab 05/15/14 1729 05/15/14 2305 05/16/14 0055 05/17/14 0600  NA 126*  --  122* 132*  K 4.4  --  4.4 4.2  CL 90*  --  87* 93*  CO2 27  --  26 29  GLUCOSE 95  --  125* 131*  BUN 18  --  16 16  CREATININE 1.07 1.02 0.99 0.86  CALCIUM 8.3*  --  7.8* 8.6   GFR Estimated Creatinine Clearance: 71.7 mL/min (by C-G formula based on Cr of 0.86). Liver Function Tests:  Recent Labs Lab 05/15/14 1729 05/16/14 0055  AST 37 38*  ALT 26 24  ALKPHOS 292* 273*  BILITOT 0.5 0.6  PROT 6.1 5.8*  ALBUMIN 2.9* 2.7*   CBC:  Recent Labs Lab 05/15/14 1845 05/15/14 2305 05/16/14 0055 05/17/14 0600  WBC 3.0* 3.1* 2.8* 4.2  NEUTROABS 1.9  --   --   --   HGB 8.9* 9.0* 9.6* 9.8*  HCT 26.8* 26.9* 28.9* 29.3*  MCV 95.4 96.1 95.4 95.8  PLT 142* 129* PLATELET CLUMPS NOTED ON SMEAR, COUNT APPEARS DECREASED 125*  CBG:  Recent Labs Lab 05/15/14 2326 05/16/14 0735 05/16/14 1139 05/16/14 1704 05/17/14 0728  GLUCAP 101* 149* 161* 158* 138*   Thyroid function studies:  Recent Labs  05/15/14 2305  TSH 3.475   Microbiology No results found for this or any previous visit (from the past 240 hour(s)).   Medications:   . allopurinol  100 mg Oral BID   . ARIPiprazole  5 mg Oral Daily  . atorvastatin  10 mg Oral q1800  . bumetanide  1 mg Oral Daily  . [START ON 05/19/2014] buprenorphine  10 mcg Transdermal Weekly  . clonazePAM  0.5 mg Oral Daily  . docusate sodium  100 mg Oral BID  . feeding supplement (GLUCERNA SHAKE)  237 mL Oral TID BM  . folic acid  1 mg Oral Daily  . heparin  5,000 Units Subcutaneous 3 times per day  . ipratropium-albuterol  3 mL Nebulization BID  . lactulose  20 g Oral BID  . lamoTRIgine  150 mg Oral QHS  . levothyroxine  75 mcg Oral QAC breakfast  . linagliptin  5 mg Oral Daily  . magnesium oxide  400 mg Oral BID  . metFORMIN  1,000 mg Oral Q breakfast  . metoprolol tartrate  25 mg Oral BID  . montelukast  10 mg Oral QHS  . multivitamin with minerals  1 tablet Oral Daily  . pantoprazole  40 mg Oral Daily  . sertraline  100 mg Oral QHS  . sodium chloride  3 mL Intravenous Q12H  . spironolactone  25 mg Oral Daily  . thiamine  100 mg Oral Daily   Continuous Infusions:    Time spent: 35 minutes.  The patient is medically complex and requires high complexity decision making.    LOS: 2 days   Mahika Vanvoorhis  [facility] Pager [phone]. If unable to reach me by pager, please call my cell phone at [phone].  *Please refer to [email or web address], password [email or web address] to get updated schedule on who will round on this patient, as hospitalists switch teams weekly. If 7PM-7AM, please contact night-coverage at [email or web address], password [email or web address] for any overnight needs.  05/17/2014, 8:03 AM

## 2014-05-17 NOTE — Progress Notes (Signed)
Clinical Social Work Department BRIEF PSYCHOSOCIAL ASSESSMENT 05/17/2014  Patient:  Samantha Mcmillan, Samantha Mcmillan     Account Number:  [id number]     Admit date:  05/15/2014  Clinical Social Worker:  Glorious Peach, CLINICAL SOCIAL WORKER  Date/Time:  05/17/2014 09:32 AM  Referred by:  Physician  Date Referred:  05/17/2014 Referred for  SNF Placement   Other Referral:   Interview type:  Patient Other interview type:   Ex husband, Samantha Mcmillan at bedside    PSYCHOSOCIAL DATA Living Status:  FAMILY Admitted from facility:   Level of care:   Primary support name:  Samantha Mcmillan Primary support relationship to patient:  SPOUSE Degree of support available:   Adequate    CURRENT CONCERNS Current Concerns  Post-Acute Placement   Other Concerns:    SOCIAL WORK ASSESSMENT / PLAN CSW received consult from PT recommending SNF for short term rehab. CSW completed FL2 and faxed pt out.   Assessment/plan status:  Information/Referral to [employer] Other assessment/ plan:   Information/referral to community resources:   CSW completed FL2 and sent clinicals in [facility]. Pt and ex-husband Samantha Mcmillan wants facilities/bed offers in [employer]. Awaiting bed offers.    PATIENT'S/FAMILY'S RESPONSE TO PLAN OF CARE: CSW met with pt and ex-husband Samantha Mcmillan at bedside. CSW introduced self and explained role. CSW informed pt and Samantha Mcmillan PT recommendation for SNF. Samantha Mcmillan states that would be best for pt being that she was just at the hospital, and declined when returned home. CSW spoke briefly about the benefits of short term rehab and the process. Samantha Mcmillan and pt states they understand and wishes to be faxed out for [facility] only. CSW completed FL2 and faxed pt out, waiting on bed offers to follow up with pt and Samantha Mcmillan. CSW will continue to follow for further DC planning.       Glorious Peach BSW Intern

## 2014-05-18 DIAGNOSIS — K7290 Hepatic failure, unspecified without coma: Secondary | ICD-10-CM

## 2014-05-18 DIAGNOSIS — Z79899 Other long term (current) drug therapy: Secondary | ICD-10-CM

## 2014-05-18 DIAGNOSIS — G9341 Metabolic encephalopathy: Secondary | ICD-10-CM | POA: Diagnosis present

## 2014-05-18 DIAGNOSIS — K7682 Hepatic encephalopathy: Secondary | ICD-10-CM | POA: Diagnosis present

## 2014-05-18 LAB — GLUCOSE, CAPILLARY: GLUCOSE-CAPILLARY: 115 mg/dL — AB (ref 70–99)

## 2014-05-18 LAB — BASIC METABOLIC PANEL
ANION GAP: 9 (ref 5–15)
BUN: 17 mg/dL (ref 6–23)
CALCIUM: 8.3 mg/dL — AB (ref 8.4–10.5)
CO2: 31 mmol/L (ref 19–32)
CREATININE: 1 mg/dL (ref 0.50–1.10)
Chloride: 93 mmol/L — ABNORMAL LOW (ref 96–112)
GFR calc Af Amer: 70 mL/min — ABNORMAL LOW (ref >90)
GFR calc non Af Amer: 60 mL/min — ABNORMAL LOW (ref >90)
Glucose, Bld: 111 mg/dL — ABNORMAL HIGH (ref 70–99)
Potassium: 4.3 mmol/L (ref 3.5–5.1)
Sodium: 133 mmol/L — ABNORMAL LOW (ref 135–145)

## 2014-05-18 MED ORDER — THIAMINE HCL 100 MG PO TABS: 100.0000 mg | ORAL_TABLET | Freq: Every day | ORAL | Status: DC

## 2014-05-18 MED ORDER — CLONAZEPAM 0.5 MG PO TABS: 0.5000 mg | ORAL_TABLET | Freq: Every day | ORAL | Status: DC

## 2014-05-18 MED ORDER — FOLIC ACID 1 MG PO TABS: 1.0000 mg | ORAL_TABLET | Freq: Every day | ORAL | Status: DC

## 2014-05-18 MED ORDER — GLUCERNA SHAKE PO LIQD: 237.0000 mL | Freq: Three times a day (TID) | ORAL | Status: DC

## 2014-05-18 MED ORDER — BUPRENORPHINE 10 MCG/HR TD PTWK: 10.0000 ug | MEDICATED_PATCH | TRANSDERMAL | Status: DC

## 2014-05-18 MED ORDER — LACTULOSE 10 GM/15ML PO SOLN: 20.0000 g | Freq: Two times a day (BID) | ORAL | Status: DC

## 2014-05-18 MED ORDER — ADULT MULTIVITAMIN W/MINERALS CH: 1.0000 | ORAL_TABLET | Freq: Every day | ORAL | Status: AC

## 2014-05-18 NOTE — Progress Notes (Signed)
Report given to [place].

## 2014-05-18 NOTE — Progress Notes (Signed)
Physical Therapy Treatment Patient Details Name: Samantha Mcmillan MRN: [id number] DOB: [birth year] Today's Date: Jun 04, 2014    History of Present Illness Pt is a 59 y/o F w/ PMHx of DM type II, HTN, COPD, chronic back pain, and cirrhosis admitted  05/15/14  with progressive weakness , DC from [facility] 05/09/14 after admission for sepsis.    PT Comments    Pt sitting EOB on arrival and ready to attempt ambulation today.  Pt able to tolerate short distance with cues for rest breaks and breathing, remained on 2L O2 [place].  Pt likely to d/c to SNF today.  Follow Up Recommendations  SNF     Equipment Recommendations  None recommended by PT    Recommendations for Other Services       Precautions / Restrictions Precautions Precautions: Fall Precaution Comments: on oxygen    Mobility  Bed Mobility               General bed mobility comments: sitting EOB on arrival, reports she just received breathing tx  Transfers Overall transfer level: Needs assistance Equipment used: Rolling walker (2 wheeled) Transfers: Sit to/from Stand Sit to Stand: Min assist         General transfer comment: verbal cues for safe technique  Ambulation/Gait Ambulation/Gait assistance: Min guard Ambulation Distance (Feet): 60 Feet Assistive device: Rolling walker (2 wheeled) Gait Pattern/deviations: Step-through pattern;Decreased stride length;Trunk flexed Gait velocity: Decreased   General Gait Details: DOE with occasional standing rest breaks needed, educated on pursed lip breathing technique, remained on 2L O2    Stairs            Wheelchair Mobility    Modified Rankin (Stroke Patients Only)       Balance                                    Cognition Arousal/Alertness: Awake/alert Behavior During Therapy: WFL for tasks assessed/performed Overall Cognitive Status: Within Functional Limits for tasks assessed                      Exercises       General Comments        Pertinent Vitals/Pain Pain Assessment: No/denies pain    Home Living                      Prior Function            PT Goals (current goals can now be found in the care plan section) Progress towards PT goals: Progressing toward goals    Frequency  Min 3X/week    PT Plan Current plan remains appropriate    Co-evaluation             End of Session Equipment Utilized During Treatment: Gait belt;Oxygen Activity Tolerance: Patient tolerated treatment well Patient left: in chair;with call bell/phone within reach;with chair alarm set     Time: 9833-8250 PT Time Calculation (min) (ACUTE ONLY): 17 min  Charges:  $Gait Training: 8-22 mins                    G Codes:      Samantha Mcmillan,Samantha Mcmillan June 04, 2014, 12:22 PM Samantha Mcmillan, PT, DPT 2014-06-04 Pager: [phone]

## 2014-05-18 NOTE — Progress Notes (Signed)
[facility] called for transport.     Melizza Kanode, LCSW [facility] Clinical Social Worker cell #: [phone]  

## 2014-05-18 NOTE — Discharge Summary (Addendum)
Physician Discharge Summary  Samantha Mcmillan EXH:[id number] DOB: 1955/08/23 DOA: 05/15/2014  PCP: Casilda Carls, MD  Admit date: 05/15/2014 Discharge date: 05/18/2014   Recommendations for Outpatient Follow-Up:   1. The patient is being discharged to a SNF for rehab. To prevent exacerbations of your heart failure, it is important that her weight be checked at the same time every day, and that if she gains over 3 pounds, OR she develops worsening swelling to the legs, experiences more shortness of breath or chest pain, that her MD be called for further diuretic adjustment.  She should follow a heart healthy, low salt diet and restrict her fluid intake to 1.5 liters a day or less. Avoid polypharmacy/sedating medications. Continue supplemental oxygen.  Monitor CBC/BMET weekly.  Recommend voiding trial and discontinuation of foley over the next few days at [facility].   Discharge Diagnosis:   Principal Problem:    Metabolic & toxic encephalopathy, multifactorial Active Problems:    Hypothyroidism    Recent HCAP (healthcare-associated pneumonia)    DM2 (diabetes mellitus, type 2)    Weakness    Hyponatremia    Failure to thrive in adult    Other pancytopenia    Hepatic cirrhosis    Protein-calorie malnutrition, severe    Advanced COPD    Acute on chronic diastolic CHF (congestive heart failure)    Chronic back pain    Hepatic encephalopathy    Polypharmacy    Urinary retention   Discharge Condition: Improved.  Diet recommendation: Low sodium, heart healthy.  Carbohydrate-modified.   History of Present Illness:   Samantha Mcmillan is an 59 y.o. female with a PMH of cirrhosis, COPD, hypertension and depression, recent hospitalization 05/04/14-05/09/14 for evaluation of altered mental status and hypotension secondary to HCAP and Escherichia coli UTI, discharged on Ceftin/azithromycin who was readmitted 05/15/14 with a chief complaint of weakness and inability to  ambulate, poor appetite and failure to thrive and lethargy. Labs were significant for mild pancytopenia and mild hyponatremia. She was hemodynamically stable. Chest x-ray showed patchy airspace opacity is favoring edema.   Hospital Course by Problem:   Principal Problem:  Multifactorial toxic/metabolic encephalopathy/Failure to thrive in adult and weakness in the setting of recent treatment for HCAP and ongoing abnormal CXR, thought to be from hepatic encephalopathy and polypharmacy as well as decompensated CHF - Given findings on chest x-ray and recent hospitalization with sepsis secondary to HCAP, she was placed on empiric antibiotics. CXR findings were felt to be more consistent with CHF and since she had already received adequate treatment for infection, empiric antibiotics were D/C'd after 24 hours. - 2-D Echo done at [facility] (report in care everywhere) 03/28/14, EF > 38% with diastolic dysfunction. Diuresed for CHF. - Unable to obtain MRI brain secondary to claustrophobic reaction, but neuro exam non-focal after lethargy resolved. - Lactulose started 05/16/14 for possible hepatic encephalopathy. Ammonia not elevated, but this does not correlate with encephalopathy. Mental status improved with initiation of lactulose therapy. - CK levels WNL, ruling out statin induced myopathy. - Polypharmacy also felt to be contributory. Discontinued Neurontin and when necessary Flexeril and hydromorphone 05/16/14.  Active Problems:   Urinary retention - Maintained on foley catheter.  Voiding trial at SNF.   Acute on chronic diastolic CHF - Diuresed during February hospital visit at [facility]. D/C'd home on Bumex. - Hydrated during most recent hospitalization here, likely contributing to acute diastolic CHF. - Renal function stable with diuresis. I/O- by 3.7 L/48 hours. Weight down 3.6 kg. - Continue  Spironolactone and Bumex. - Needs strict I&O and adjustment of diuretics for fluid retention.   Chronic back  pain - Appears to be on high dose chronic narcotics. Avoid hydromorphone. - On buprenorphine patch.   Severe malnutrition - Pt met criteria for severe MALNUTRITION in the context of acute illness as evidenced by energy intake <50% for >/=5 days and 18% weight loss x 2 months. - Supplements ordered per dietician recommendations.   Hyperlipidemia - Continue statin.   Depression/anxiety/PTSD/panic attacks/OCD - Continue Abilify, Zoloft and Klonopin.   Chronic respiratory failure with hypoxia secondary to advanced COPD - Continue supplemental oxygen. - Continue Symbicort. - ABG showed pH 7.423, PCO2 45, and PO2 64.5.   Hypothyroidism - Continue Synthroid. TSH WNL.   DM2 (diabetes mellitus, type 2) - Currently on metformin and Trajenta. - CBGs 115-161.   Hyponatremia - Likely secondary to cirrhosis or CHF physiology.  - Improved with diuresis.   Cirrhosis secondary to probable NAFLD/Other pancytopenia - Continue Lactulose, Spironolactone. - Monitor blood counts. - Was hospitalized 03/26/14-03/31/14 at [facility] for diuresis.    Medical Consultants:    None.   Discharge Exam:   Filed Vitals:   05/18/14 0441  BP: 105/49  Pulse: 73  Temp: 98 F (36.7 C)  Resp: 18   Filed Vitals:   05/17/14 1027 05/17/14 2018 05/17/14 2052 05/18/14 0441  BP:   98/50 105/49  Pulse:   77 73  Temp:   98.1 F (36.7 C) 98 F (36.7 C)  TempSrc:   Oral Oral  Resp:   16 18  Height:      Weight:    79.2 kg (174 lb 9.7 oz)  SpO2: 98% 98% 99% 99%    Gen:  NAD Cardiovascular:  RRR, No M/R/G Respiratory: Lungs CTAB Gastrointestinal: Abdomen soft, NT/ND with normal active bowel sounds. Extremities: No C/E/C   The results of significant diagnostics from this hospitalization (including imaging, microbiology, ancillary and laboratory) are listed below for reference.     Procedures and Diagnostic Studies:   Dg Chest 2 View 05/15/2014: Patchy bilateral airspace opacity representing  pulmonary edema or multifocal infection. Favor edema. Minimal improvement.    Labs:   Basic Metabolic Panel:  Recent Labs Lab 05/15/14 1729 05/15/14 2305 05/16/14 0055 05/17/14 0600 05/18/14 0526  NA 126*  --  122* 132* 133*  K 4.4  --  4.4 4.2 4.3  CL 90*  --  87* 93* 93*  CO2 27  --  _0 GLUCOSE 95  --  125* 131* 111*  BUN 18  --  _1 CREATININE 1.07 1.02 0.99 0.86 1.00  CALCIUM 8.3*  --  7.8* 8.6 8.3*   GFR Estimated Creatinine Clearance: 60.9 mL/min (by C-G formula based on Cr of 1). Liver Function Tests:  Recent Labs Lab 05/15/14 1729 05/16/14 0055  AST 37 38*  ALT 26 24  ALKPHOS 292* 273*  BILITOT 0.5 0.6  PROT 6.1 5.8*  ALBUMIN 2.9* 2.7*   CBC:  Recent Labs Lab 05/15/14 1845 05/15/14 2305 05/16/14 0055 05/17/14 0600  WBC 3.0* 3.1* 2.8* 4.2  NEUTROABS 1.9  --   --   --   HGB 8.9* 9.0* 9.6* 9.8*  HCT 26.8* 26.9* 28.9* 29.3*  MCV 95.4 96.1 95.4 95.8  PLT 142* 129* PLATELET CLUMPS NOTED ON SMEAR, COUNT APPEARS DECREASED 125*   Cardiac Enzymes:  Recent Labs Lab 05/16/14 1135  CKTOTAL 38   CBG:  Recent Labs Lab 05/16/14 0735 05/16/14 1139  05/16/14 1704 05/17/14 0728 05/18/14 0732  GLUCAP 149* 161* 158* 138* 115*   Hgb A1c  Recent Labs  05/15/14 2305  HGBA1C 5.3   Thyroid function studies  Recent Labs  05/15/14 2305  TSH 3.475   Microbiology Recent Results (from the past 240 hour(s))  Culture, blood (routine x 2) Call MD if unable to obtain prior to antibiotics being given     Status: None (Preliminary result)   Collection Time: 05/16/14 12:50 AM  Result Value Ref Range Status   Specimen Description BLOOD LEFT HAND  Final   Special Requests BOTTLES DRAWN AEROBIC ONLY 2ML  Final   Culture   Final           BLOOD CULTURE RECEIVED NO GROWTH TO DATE CULTURE WILL BE HELD FOR 5 DAYS BEFORE ISSUING A FINAL NEGATIVE REPORT Note: Culture results may be compromised due to an inadequate volume of blood received in  culture bottles. Performed at [facility]    Report Status PENDING  Incomplete  Culture, blood (routine x 2) Call MD if unable to obtain prior to antibiotics being given     Status: None (Preliminary result)   Collection Time: 05/16/14 12:55 AM  Result Value Ref Range Status   Specimen Description BLOOD LEFT ANTECUBITAL  Final   Special Requests BOTTLES DRAWN AEROBIC ONLY 3ML  Final   Culture   Final           BLOOD CULTURE RECEIVED NO GROWTH TO DATE CULTURE WILL BE HELD FOR 5 DAYS BEFORE ISSUING A FINAL NEGATIVE REPORT Performed at [facility]    Report Status PENDING  Incomplete     Discharge Instructions:       Discharge Instructions    ([place]) Call MD:  Anytime you have any of the following symptoms: 1) 3 pound weight gain in 24 hours or 5 pounds in 1 week 2) shortness of breath, with or without a dry hacking cough 3) swelling in the hands, feet or stomach 4) if you have to sleep on extra pillows at night in order to breathe.    Complete by:  As directed      Call MD for:  extreme fatigue    Complete by:  As directed      Diet - low sodium heart healthy    Complete by:  As directed      Diet Carb Modified    Complete by:  As directed      Discharge instructions    Complete by:  As directed   You were treated for heart failure in the hospital.  To prevent exacerbations of your heart failure, it is important that you check your weight at the same time every day, and that if you gain over 2 pounds, OR you develop worsening swelling to the legs, experience more shortness of breath or chest pain, you call your Primary MD immediately.   Follow a heart healthy, low salt diet and restrict your fluid intake to 1.5 liters a day or less.     Increase activity slowly    Complete by:  As directed      Walk with assistance    Complete by:  As directed      Walker     Complete by:  As directed             Medication List    STOP taking these  medications        azithromycin 500 MG  tablet  Commonly known as:  ZITHROMAX     cefUROXime 500 MG tablet  Commonly known as:  CEFTIN     cyclobenzaprine 10 MG tablet  Commonly known as:  FLEXERIL     gabapentin 300 MG capsule  Commonly known as:  NEURONTIN     HYDROmorphone 2 MG tablet  Commonly known as:  DILAUDID     lisinopril 20 MG tablet  Commonly known as:  PRINIVIL,ZESTRIL     metoprolol tartrate 25 MG tablet  Commonly known as:  LOPRESSOR     promethazine 12.5 MG tablet  Commonly known as:  PHENERGAN      TAKE these medications        acetaminophen 500 MG tablet  Commonly known as:  TYLENOL  Take 1,000 mg by mouth every 6 (six) hours as needed for moderate pain or headache.     albuterol-ipratropium 18-103 MCG/ACT inhaler  Commonly known as:  COMBIVENT  Inhale 2 puffs into the lungs 2 (two) times daily.     allopurinol 100 MG tablet  Commonly known as:  ZYLOPRIM  Take 100 mg by mouth 2 (two) times daily.     ARIPiprazole 5 MG tablet  Commonly known as:  ABILIFY  Take 5 mg by mouth daily.     atorvastatin 10 MG tablet  Commonly known as:  LIPITOR  Take 10 mg by mouth daily at 6 PM.     budesonide-formoterol 80-4.5 MCG/ACT inhaler  Commonly known as:  SYMBICORT  Inhale 2 puffs into the lungs 2 (two) times daily as needed (shortness of breath).     bumetanide 1 MG tablet  Commonly known as:  BUMEX  1 tablet daily or as directed.     buprenorphine 10 MCG/HR Ptwk patch  Commonly known as:  BUTRANS - dosed mcg/hr  Place 1 patch (10 mcg total) onto the skin once a week.     clonazePAM 0.5 MG tablet  Commonly known as:  KLONOPIN  Take 1 tablet (0.5 mg total) by mouth daily.     feeding supplement (GLUCERNA SHAKE) Liqd  Take 237 mLs by mouth 3 (three) times daily between meals.     folic acid 1 MG tablet  Commonly known as:  FOLVITE  Take 1 tablet (1 mg total) by mouth daily.     Hydrocortisone Ace-Pramoxine 2.5-1 % Crea  Apply 1 application  topically 2 (two) times daily.     lactulose 10 GM/15ML solution  Commonly known as:  CHRONULAC  Take 30 mLs (20 g total) by mouth 2 (two) times daily.     lamoTRIgine 150 MG tablet  Commonly known as:  LAMICTAL  Take 150 mg by mouth at bedtime.     levothyroxine 75 MCG tablet  Commonly known as:  SYNTHROID, LEVOTHROID  Take 75 mcg by mouth daily.     magnesium oxide 400 MG tablet  Commonly known as:  MAG-OX  Take 400 mg by mouth 2 (two) times daily.     montelukast 10 MG tablet  Commonly known as:  SINGULAIR  Take 10 mg by mouth at bedtime.     multivitamin with minerals Tabs tablet  Take 1 tablet by mouth daily.     pantoprazole 40 MG tablet  Commonly known as:  PROTONIX  Take 40 mg by mouth daily.     potassium chloride 10 MEQ tablet  Commonly known as:  K-DUR  Take 10 mEq by mouth at bedtime.     Saxagliptin-Metformin 2.06-998 MG Tb24  Take 1 tablet by mouth 2 (two) times daily. Hold until blood glucose greater than 150     sertraline 100 MG tablet  Commonly known as:  ZOLOFT  Take 100 mg by mouth at bedtime.     spironolactone 25 MG tablet  Commonly known as:  ALDACTONE  Take 1 tablet (25 mg total) by mouth daily.     thiamine 100 MG tablet  Take 1 tablet (100 mg total) by mouth daily.     Vitamin D (Ergocalciferol) 50000 UNITS Caps capsule  Commonly known as:  DRISDOL  Take 50,000 Units by mouth every 7 (seven) days. Saturday       Follow-up Information    Follow up with [facility], MD. Schedule an appointment as soon as possible for a visit in 3 weeks.   Specialty:  Internal Medicine   Why:  Hospital follow up.   Contact information:   [place] [address] [phone]        Time coordinating discharge: 40 minutes.  Signed:  Tandy Lewin  Pager [phone] [facility] 05/18/2014, 9:26 AM

## 2014-05-18 NOTE — Progress Notes (Signed)
Patient is set to discharge to [facility] SN[facility] today. Patient & ex-husband, Samantha Mcmillan aware. Discharge packet given to RN, Doroteo Bradford. [facility] will be called for transport once done with lunch.   Clinical Social Work Department CLINICAL SOCIAL WORK PLACEMENT NOTE 05/18/2014  Patient:  Samantha Mcmillan, Samantha Mcmillan  Account Number:  [id number] Admit date:  05/15/2014  Clinical Social Worker:  Renold Genta  Date/time:  05/17/2014 10:27 AM  Clinical Social Work is seeking post-discharge placement for this patient at the following level of care:   SKILLED NURSING   (*CSW will update this form in Epic as items are completed)   05/17/2014  Patient/family provided with [place] Department of Clinical Social Work's list of facilities offering this level of care within the geographic area requested by the patient (or if unable, by the patient's family).  05/17/2014  Patient/family informed of their freedom to choose among providers that offer the needed level of care, that participate in Medicare, Medicaid or managed care program needed by the patient, have an available bed and are willing to accept the patient.  05/17/2014  Patient/family informed of [facility]' ownership interest in [facility], as well as of the fact that they are under no obligation to receive care at this facility.  PASARR submitted to EDS on 05/17/2014 PASARR number received on 05/17/2014  FL2 transmitted to all facilities in geographic area requested by pt/family on  05/17/2014 FL2 transmitted to all facilities within larger geographic area on   Patient informed that his/her managed care company has contracts with or will negotiate with  certain facilities, including the following:     Patient/family informed of bed offers received:  05/17/2014 Patient chooses bed at [facility] Physician recommends and patient chooses bed at    Patient to be transferred to [facility] on  05/18/2014 Patient to be transferred to  facility by [facility] Patient and family notified of transfer on 05/18/2014 Name of family member notified:  patient's ex-husband, Samantha Mcmillan at bedside  The following physician request were entered in Epic:   Additional Comments:     Raynaldo Opitz, [place] Social Worker cell #: [phone]

## 2014-05-18 NOTE — Progress Notes (Signed)
CSW assisted patient/family in completing Advance Directives. The patient designated her son, [name] as her primary healthcare agent.  Patient also completed healthcare living will.  The patient designates she does not want life prolonging measures in the situations indicated in her living will.  Clinical Social Worker notarized documents and made copies for patient/family. Clinical Social Worker placed copy on shadow chart to be scanned into patient's chart.   Clinical Social Worker encouraged patient/family to contact with any additional questions or concerns.  Raynaldo Opitz, [facility] Clinical Social Worker cell #: [phone]

## 2014-05-21 ENCOUNTER — Non-Acute Institutional Stay (SKILLED_NURSING_FACILITY): Payer: Medicare Other | Admitting: Registered Nurse

## 2014-05-21 ENCOUNTER — Encounter: Payer: Self-pay | Admitting: Registered Nurse

## 2014-05-21 DIAGNOSIS — I5033 Acute on chronic diastolic (congestive) heart failure: Secondary | ICD-10-CM

## 2014-05-21 DIAGNOSIS — G92 Toxic encephalopathy: Secondary | ICD-10-CM

## 2014-05-21 DIAGNOSIS — E43 Unspecified severe protein-calorie malnutrition: Secondary | ICD-10-CM

## 2014-05-21 DIAGNOSIS — K5900 Constipation, unspecified: Secondary | ICD-10-CM

## 2014-05-21 DIAGNOSIS — G928 Other toxic encephalopathy: Secondary | ICD-10-CM

## 2014-05-21 DIAGNOSIS — R339 Retention of urine, unspecified: Secondary | ICD-10-CM

## 2014-05-21 DIAGNOSIS — E119 Type 2 diabetes mellitus without complications: Secondary | ICD-10-CM | POA: Diagnosis not present

## 2014-05-21 DIAGNOSIS — E039 Hypothyroidism, unspecified: Secondary | ICD-10-CM | POA: Diagnosis not present

## 2014-05-21 DIAGNOSIS — K7460 Unspecified cirrhosis of liver: Secondary | ICD-10-CM

## 2014-05-21 DIAGNOSIS — Z9889 Other specified postprocedural states: Secondary | ICD-10-CM | POA: Diagnosis not present

## 2014-05-21 DIAGNOSIS — E785 Hyperlipidemia, unspecified: Secondary | ICD-10-CM

## 2014-05-21 DIAGNOSIS — J449 Chronic obstructive pulmonary disease, unspecified: Secondary | ICD-10-CM

## 2014-05-21 DIAGNOSIS — M549 Dorsalgia, unspecified: Secondary | ICD-10-CM

## 2014-05-21 DIAGNOSIS — R5381 Other malaise: Secondary | ICD-10-CM | POA: Diagnosis not present

## 2014-05-21 DIAGNOSIS — Z978 Presence of other specified devices: Secondary | ICD-10-CM

## 2014-05-21 DIAGNOSIS — Z960 Presence of urogenital implants: Secondary | ICD-10-CM

## 2014-05-21 DIAGNOSIS — G8929 Other chronic pain: Secondary | ICD-10-CM

## 2014-05-21 DIAGNOSIS — R600 Localized edema: Secondary | ICD-10-CM

## 2014-05-21 DIAGNOSIS — F418 Other specified anxiety disorders: Secondary | ICD-10-CM

## 2014-05-21 NOTE — Progress Notes (Signed)
Patient ID: Samantha Mcmillan, female   DOB: April 29, 1955, 59 y.o.   MRN: [id number]   Place of Service: [facility]  Allergies  Allergen Reactions  . Iodine Shortness Of Breath  . Latex Anaphylaxis  . Oxycodone Shortness Of Breath  . Percocet [Oxycodone-Acetaminophen] Shortness Of Breath  . Shellfish Allergy Anaphylaxis  . Amitriptyline Other (See Comments)    Mental changes  . Wellbutrin [Bupropion] Other (See Comments)    Mental changes  . Augmentin [Amoxicillin-Pot Clavulanate] Rash  . Codeine Rash  . Cortisone Rash  . Diphenhydramine Rash  . Other Rash    darvocet  . Vicodin [Hydrocodone-Acetaminophen] Rash    dizzy    Code Status: Full Code  Goals of Care: Longevity/STR  Chief Complaint  Patient presents with  . Hospitalization Follow-up    HPI 59 y.o. female with PMH of liver cirrohosis, HTN, advanced COPD, oxygen dependent, CHF, depression w/ anxiety among others is being seen for a follow-up visit post hospital admission from 05/15/14 to 05/18/14 for toxic metabolic encephalopathy thought to be from decompensated CHF, recent HCAP, liver cirrhosis and and polypharmacy. She is here for short term rehab and her goal is to return home. Seen in room today. Reported pain is adequately controlled with current regimen. Also reported having issue with constipation, last BM was yesterday. Denies any other concerns.   Review of Systems Constitutional: Negative for fever and chills HENT: Negative for ear pain and sore throat Eyes: Negative for eye pain and eye discharge Cardiovascular: Negative for chest pain and palpitation. Positive for BLE swelling.  Respiratory: Positive for productive cough. Positive for shortness of breath and wheezing (improving).  Gastrointestinal: Negative for nausea and vomiting. Positive for constipation Genitourinary: Negative for  dysuria, frequency, urgency, and hematuria Musculoskeletal: Positive for back pain (chronic) Neurological:  Negative for dizziness and headache. Positive for generalized weakness Skin: Negative for rash and wound.   Psychiatric: Positive for depression. Negative for suicidal ideation.   Past Medical History  Diagnosis Date  . GERD (gastroesophageal reflux disease)   . Colon polyp   . Cirrhosis of liver   . Hypertension   . Depression   . COPD (chronic obstructive pulmonary disease)   . IBS (irritable bowel syndrome)   . On home oxygen therapy     "2L; 24/7" (05/04/2014)  . Family history of adverse reaction to anesthesia     "my sister doesn't wake up good when she's put deep to sleep"  . High cholesterol   . Heart murmur   . Asthma   . Pneumonia "several times"  . Chronic bronchitis     "get it pretty much q yr"   . Sleep apnea     "can't tolerate CPAP" (05/04/2014)  . Type II diabetes mellitus   . History of blood transfusion     "related to my anemia"  . Anemia   . Iron deficiency anemia   . Headache     "more than a couple times/wk" (05/04/2014)  . Arthritis     "some in my feet" (05/04/2014)  . Chronic lower back pain   . Anxiety   . Acute on chronic diastolic CHF (congestive heart failure) 05/16/2014  . OCD (obsessive compulsive disorder)   . Panic attack   . PTSD (post-traumatic stress disorder)   . Chronic respiratory failure     Past Surgical History  Procedure Laterality Date  . Cesarean section  1979  . Cataract extraction w/ intraocular lens  implant, bilateral Bilateral  2005  . Back surgery    . Colonoscopy  2014  . Upper gastrointestinal endoscopy    . Posterior fusion cervical spine  2015    "rebuilt 3 of my neck vertebrae"  . Incision and drainage of wound Right 2009    "leg mauled  by dog"  . Tubal ligation  1979  . Dilation and curettage of uterus      History  Substance Use Topics  . Smoking status: Current Every Day Smoker -- 0.50 packs/day for 48 years    Types: Cigarettes  . Smokeless tobacco: Never Used  . Alcohol Use: No    Family History   Problem Relation Age of Onset  . Cancer Father     pancreatic      Medication List       This list is accurate as of: 05/21/14 11:07 PM.  Always use your most recent med list.               acetaminophen 500 MG tablet  Commonly known as:  TYLENOL  Take 1,000 mg by mouth every 6 (six) hours as needed for moderate pain or headache.     albuterol-ipratropium 18-103 MCG/ACT inhaler  Commonly known as:  COMBIVENT  Inhale 2 puffs into the lungs 2 (two) times daily.     allopurinol 100 MG tablet  Commonly known as:  ZYLOPRIM  Take 100 mg by mouth 2 (two) times daily.     ARIPiprazole 5 MG tablet  Commonly known as:  ABILIFY  Take 5 mg by mouth daily.     atorvastatin 10 MG tablet  Commonly known as:  LIPITOR  Take 10 mg by mouth daily at 6 PM.     budesonide-formoterol 80-4.5 MCG/ACT inhaler  Commonly known as:  SYMBICORT  Inhale 2 puffs into the lungs 2 (two) times daily as needed (shortness of breath).     bumetanide 1 MG tablet  Commonly known as:  BUMEX  1 tablet daily or as directed.     buprenorphine 10 MCG/HR Ptwk patch  Commonly known as:  BUTRANS - dosed mcg/hr  Place 1 patch (10 mcg total) onto the skin once a week.     clonazePAM 0.5 MG tablet  Commonly known as:  KLONOPIN  Take 1 tablet (0.5 mg total) by mouth daily.     feeding supplement (GLUCERNA SHAKE) Liqd  Take 237 mLs by mouth 3 (three) times daily between meals.     folic acid 1 MG tablet  Commonly known as:  FOLVITE  Take 1 tablet (1 mg total) by mouth daily.     Hydrocortisone Ace-Pramoxine 2.5-1 % Crea  Apply 1 application topically 2 (two) times daily.     lactulose 10 GM/15ML solution  Commonly known as:  CHRONULAC  Take 30 mLs (20 g total) by mouth 2 (two) times daily.     lamoTRIgine 150 MG tablet  Commonly known as:  LAMICTAL  Take 150 mg by mouth at bedtime.     levothyroxine 75 MCG tablet  Commonly known as:  SYNTHROID, LEVOTHROID  Take 75 mcg by mouth daily.      magnesium oxide 400 MG tablet  Commonly known as:  MAG-OX  Take 400 mg by mouth 2 (two) times daily.     montelukast 10 MG tablet  Commonly known as:  SINGULAIR  Take 10 mg by mouth at bedtime.     multivitamin with minerals Tabs tablet  Take 1 tablet by mouth daily.  pantoprazole 40 MG tablet  Commonly known as:  PROTONIX  Take 40 mg by mouth daily.     potassium chloride 10 MEQ tablet  Commonly known as:  K-DUR  Take 10 mEq by mouth at bedtime.     Saxagliptin-Metformin 2.06-998 MG Tb24  Take 1 tablet by mouth 2 (two) times daily. Hold until blood glucose greater than 150     sertraline 100 MG tablet  Commonly known as:  ZOLOFT  Take 100 mg by mouth at bedtime.     spironolactone 25 MG tablet  Commonly known as:  ALDACTONE  Take 1 tablet (25 mg total) by mouth daily.     thiamine 100 MG tablet  Take 1 tablet (100 mg total) by mouth daily.     Vitamin D (Ergocalciferol) 50000 UNITS Caps capsule  Commonly known as:  DRISDOL  Take 50,000 Units by mouth every 7 (seven) days. Saturday        Physical Exam  BP 120/62 mmHg  Pulse 76  Temp(Src) 97.9 F (36.6 C)  Resp 16  Ht 5\' 4"  (1.626 m)  Wt 182 lb 1.6 oz (82.6 kg)  BMI 31.24 kg/m2  SpO2 99%  Constitutional: Frail adult female in no acute distress. Appears older than stated age. Conversant and pleasant HEENT: Normocephalic and atraumatic. PERRL. EOM intact. No icterus. No nasal discharge or sinus tenderness. Oral mucosa moist. Posterior pharynx clear of any exudate or lesions.  Neck: Supple and nontender. No lymphadenopathy, masses, or thyromegaly. No JVD or carotid bruits. Cardiac: Normal S1, S2. RRR without appreciable murmurs, rubs, or gallops. Distal pulses intact. 1+ pitting edema of BLE Lungs: No respiratory distress. Breath sounds diminished with wheezes bilaterally. Oxygen at 2lpm via [place] in place. Abdomen: Audible bowel sounds in all quadrants. Soft, nontender, nondistended.  Musculoskeletal: able to  move all extremities with generalized weakness.  Skin: Warm and dry. Bruising noted on BUE. Petechiae noted on chest area. Small hematoma/ecchemotic area noted on LLE.  Neurological: Alert and oriented to person, place, and time. No focal deficits.  Psychiatric: Judgment and insight adequate. Appropriate mood and affect.   Labs Reviewed  CBC Latest Ref Rng 05/17/2014 05/16/2014 05/15/2014  WBC 4.0 - 10.5 K/uL 4.2 2.8(L) 3.1(L)  Hemoglobin 12.0 - 15.0 g/dL 9.8(L) 9.6(L) 9.0(L)  Hematocrit 36.0 - 46.0 % 29.3(L) 28.9(L) 26.9(L)  Platelets 150 - 400 K/uL 125(L) PLATELET CLUMPS NOTED ON SMEAR, COUNT APPEARS DECREASED 129(L)    CMP Latest Ref Rng 05/18/2014 05/17/2014 05/16/2014  Glucose 70 - 99 mg/dL 111(H) 131(H) 125(H)  BUN 6 - 23 mg/dL 17 16 16   Creatinine 0.50 - 1.10 mg/dL 1.00 0.86 0.99  Sodium 135 - 145 mmol/L 133(L) 132(L) 122(L)  Potassium 3.5 - 5.1 mmol/L 4.3 4.2 4.4  Chloride 96 - 112 mmol/L 93(L) 93(L) 87(L)  CO2 19 - 32 mmol/L 31 29 26   Calcium 8.4 - 10.5 mg/dL 8.3(L) 8.6 7.8(L)  Total Protein 6.0 - 8.3 g/dL - - 5.8(L)  Albumin 3.6 - 4.8 g/dL - - -  Total Bilirubin 0.3 - 1.2 mg/dL - - 0.6  Alkaline Phos 39 - 117 U/L - - 273(H)  AST 0 - 37 U/L - - 38(H)  ALT 0 - 35 U/L - - 24    Lab Results  Component Value Date   HGBA1C 5.3 05/15/2014    Lab Results  Component Value Date   TSH 3.475 05/15/2014   Diagnostic Studies Reviewed 05/15/14: CXR: Patchy bilateral airspace opacity representing pulmonary edema or multifocal infection.  Favor edema. Minimal improvement.  Assessment & Plan 1. Acute on chronic diastolic CHF (congestive heart failure) Appears euvolemic on exam. Continue bumex 1mg  daily and aldactone 25mg  daily with potassium 41meQ daily. Continue daily weight and monitor her status.   2. Bilateral leg edema TED hose to BLE while awake. Encourage elevating BLE as much as possible to reduce swelling. Continue bumex and aldactone.   3. Advanced COPD Continue  continuous O2 at 2lpm via [place]. Continue symbicort 80/4.77mcg 2 puffs twice daily. Will discontinue combivent and start duoneb twice daily routinely and every six hours as needed for shortness of breath and wheezing. Start mucinex 600mg  twice daily x 5 days then twice daily as needed for cough and congestion. Continue to monitor her status  4. Hepatic cirrhosis/pancytopenia Secondary to probably NAFLD. Continue lactulose 20g twice daily and aldactone 25mg  daily. Continue to monitor  5. Hypothyroidism, unspecified hypothyroidism type TSH normal. Continue levothyroxine 72mcg daily  6. Type 2 diabetes mellitus with diabetic polyneuropathy Recent a1c 5.3 on 05/15/14. Has order for saxagliptin-metfromin 2.5-1000mg  twice daily but hold until cbg >150. Hold saxagliptin-metformin for now. Continue to monitor cbg twice daily. Will restart as indicated. Continue to monitor her status.   7. Urinary retention Foley cath in place. Will d/c foley for voiding trial. If no voiding after 8 hours, will reinsert foley and initiate urology consult. Continue to monitor her status for now.   8. Constipation Start sennakot-s 8.6/50mg  2 tabs daily and miralax 17g daily as needed. Continue lactulose twice daily. Encourage adequate hydration and ambulation as tolerated.   9. Protein-calorie malnutrition, severe Continue current diet and dietary supplement. Continue to monitor her status.   10. Chronic back pain Stable. Continue tylenol 500mg  every six hours as needed for moderate pain (pt stated that she only uses tylenol when it is necessary due to liver cirrhosis)  with buprenorphine 10cmg/hr patch transdermally once a week.   11. Physical deconditioning Continue to work with PT/OT for gait/strenth/balance training to restore/maximize function. Fall risk precautions  12. Toxic metabolic encephalopathy  Resolved. Multifactorial-thought to be r/t to polypharmacy, decompensated CHF, recent HCAP and liver  encephalopathy.  13. Depression/anxiety/OCD/PTSD Mood stable. Continue lamictal 150mg  daily, abilify 5mg  daily, zoloft 100mg  daily and klonopin 0.5mg  daily. Continue to monitor for change in mood  14. Foley catheter present on admission See #7  15. HLD Continue lipitor 10mg  daily.    Diagnostic Studies/Labs Ordered: cbc, cmp, vit D  Time spent: 50 minutes on care coordination and counseling .  Family/Staff Communication Plan of care discussed with patient and nursing staff. Patient and nursing staff verbalized understanding and agree with plan of care. No additional questions or concerns reported.    Arthur Holms, MSN, AGNP-C [facility] [address] [place], [place] [address] [phone] [8am-5pm] After hours: [phone]

## 2014-05-22 ENCOUNTER — Other Ambulatory Visit: Payer: Self-pay | Admitting: *Deleted

## 2014-05-22 LAB — CULTURE, BLOOD (ROUTINE X 2)
CULTURE: NO GROWTH
Culture: NO GROWTH

## 2014-05-22 MED ORDER — CLONAZEPAM 0.5 MG PO TABS: 0.5000 mg | ORAL_TABLET | Freq: Every day | ORAL | Status: DC

## 2014-05-25 ENCOUNTER — Ambulatory Visit
Admit: 2014-05-25 | Disposition: A | Discharge status: Home / Self Care | Payer: Self-pay | Attending: Internal Medicine | Admitting: Internal Medicine

## 2014-05-25 ENCOUNTER — Non-Acute Institutional Stay (SKILLED_NURSING_FACILITY): Payer: Medicare Other | Admitting: Internal Medicine

## 2014-05-25 DIAGNOSIS — E039 Hypothyroidism, unspecified: Secondary | ICD-10-CM | POA: Diagnosis not present

## 2014-05-25 DIAGNOSIS — K7460 Unspecified cirrhosis of liver: Secondary | ICD-10-CM

## 2014-05-25 DIAGNOSIS — J449 Chronic obstructive pulmonary disease, unspecified: Secondary | ICD-10-CM

## 2014-05-25 DIAGNOSIS — K5900 Constipation, unspecified: Secondary | ICD-10-CM

## 2014-05-25 DIAGNOSIS — M549 Dorsalgia, unspecified: Secondary | ICD-10-CM

## 2014-05-25 DIAGNOSIS — E119 Type 2 diabetes mellitus without complications: Secondary | ICD-10-CM

## 2014-05-25 DIAGNOSIS — E46 Unspecified protein-calorie malnutrition: Secondary | ICD-10-CM | POA: Diagnosis not present

## 2014-05-25 DIAGNOSIS — R600 Localized edema: Secondary | ICD-10-CM

## 2014-05-25 DIAGNOSIS — F418 Other specified anxiety disorders: Secondary | ICD-10-CM | POA: Diagnosis not present

## 2014-05-25 DIAGNOSIS — I5033 Acute on chronic diastolic (congestive) heart failure: Secondary | ICD-10-CM

## 2014-05-25 DIAGNOSIS — K7581 Nonalcoholic steatohepatitis (NASH): Secondary | ICD-10-CM | POA: Diagnosis not present

## 2014-05-25 DIAGNOSIS — R5381 Other malaise: Secondary | ICD-10-CM | POA: Diagnosis not present

## 2014-05-25 DIAGNOSIS — G8929 Other chronic pain: Secondary | ICD-10-CM | POA: Diagnosis not present

## 2014-05-25 NOTE — Progress Notes (Signed)
Patient ID: Samantha Mcmillan, female   DOB: [birth year], 59 y.o.   MRN: [id number]     Facility: [facility]    PCP: [facility], [place]  Code Status: full code  Allergies  Allergen Reactions  . Iodine Shortness Of Breath  . Latex Anaphylaxis  . Oxycodone Shortness Of Breath  . Percocet [Oxycodone-Acetaminophen] Shortness Of Breath  . Shellfish Allergy Anaphylaxis  . Amitriptyline Other (See Comments)    Mental changes  . Wellbutrin [Bupropion] Other (See Comments)    Mental changes  . Augmentin [Amoxicillin-Pot Clavulanate] Rash  . Codeine Rash  . Cortisone Rash  . Diphenhydramine Rash  . Other Rash    darvocet  . Vicodin [Hydrocodone-Acetaminophen] Rash    dizzy    Chief Complaint  Patient presents with  . New Admit To SNF     HPI:  59 year old patient is here for short term rehabilitation post hospital admission from 05/15/14-05/18/14 with complaint of weakness and poor po intake. It was thought to be a multifactorial toxic/ metabolic encephalopathy  from chf exacerbation, polypharmacy and recent hospital admission with HCAP/ e.coli UTI. She has PMH of cirrhosis, chronic respiratory failure, COPD, hypertension, hypothyroidism, DM type 2 and depression. Of note, she was in the hospital recently with HCAP and E.coli UTI. She is seen in her room today. She has generalized weakness and easy fatigue. Denies any concerns this visit. No new concern from staff. Her foley has been removed and she is able to void.   Review of Systems:  Constitutional: Negative for fever, chills, diaphoresis.  HENT: Negative for headache, congestion, nasal discharge Eyes: Negative for blurred vision, double vision and discharge.  Respiratory: Negative for cough, wheezing.  breathing is improving Cardiovascular: Negative for chest pain, palpitations. Positive for chronic bilateral leg swelling.  Gastrointestinal: Negative for heartburn, nausea, vomiting, abdominal  pain Genitourinary: Negative for dysuria Musculoskeletal: Negative for back pain, falls Skin: Negative for itching, rash.  Neurological: Negative for dizziness, tingling, focal weakness Psychiatric/Behavioral: has history of depression.   Past Medical History  Diagnosis Date  . GERD (gastroesophageal reflux disease)   . Colon polyp   . Cirrhosis of liver   . Hypertension   . Depression   . COPD (chronic obstructive pulmonary disease)   . IBS (irritable bowel syndrome)   . On home oxygen therapy     "2L; 24/7" (05/04/2014)  . Family history of adverse reaction to anesthesia     "my sister doesn't wake up good when she's put deep to sleep"  . High cholesterol   . Heart murmur   . Asthma   . Pneumonia "several times"  . Chronic bronchitis     "get it pretty much q yr"   . Sleep apnea     "can't tolerate CPAP" (05/04/2014)  . Type II diabetes mellitus   . History of blood transfusion     "related to my anemia"  . Anemia   . Iron deficiency anemia   . Headache     "more than a couple times/wk" (05/04/2014)  . Arthritis     "some in my feet" (05/04/2014)  . Chronic lower back pain   . Anxiety   . Acute on chronic diastolic CHF (congestive heart failure) 05/16/2014  . OCD (obsessive compulsive disorder)   . Panic attack   . PTSD (post-traumatic stress disorder)   . Chronic respiratory failure    Past Surgical History  Procedure Laterality Date  . Cesarean section  1979  .  Cataract extraction w/ intraocular lens  implant, bilateral Bilateral 2005  . Back surgery    . Colonoscopy  2014  . Upper gastrointestinal endoscopy    . Posterior fusion cervical spine  2015    "rebuilt 3 of my neck vertebrae"  . Incision and drainage of wound Right 2009    "leg mauled  by dog"  . Tubal ligation  1979  . Dilation and curettage of uterus     Social History:   reports that she has been smoking Cigarettes.  She has a 24 pack-year smoking history. She has never used smokeless tobacco.  She reports that she does not drink alcohol or use illicit drugs.  Family History  Problem Relation Age of Onset  . Cancer Father     pancreatic    Medications: Patient's Medications  New Prescriptions   No medications on file  Previous Medications   ACETAMINOPHEN (TYLENOL) 500 MG TABLET    Take 1,000 mg by mouth every 6 (six) hours as needed for moderate pain or headache.   ALLOPURINOL (ZYLOPRIM) 100 MG TABLET    Take 100 mg by mouth 2 (two) times daily.    ARIPIPRAZOLE (ABILIFY) 5 MG TABLET    Take 5 mg by mouth daily.    ATORVASTATIN (LIPITOR) 10 MG TABLET    Take 10 mg by mouth daily at 6 PM.    BUDESONIDE-FORMOTEROL (SYMBICORT) 80-4.5 MCG/ACT INHALER    Inhale 2 puffs into the lungs 2 (two) times daily as needed (shortness of breath).    BUMETANIDE (BUMEX) 1 MG TABLET    1 tablet daily or as directed.   BUPRENORPHINE (BUTRANS - DOSED MCG/HR) 10 MCG/HR PTWK PATCH    Place 1 patch (10 mcg total) onto the skin once a week.   CLONAZEPAM (KLONOPIN) 0.5 MG TABLET    Take 1 tablet (0.5 mg total) by mouth daily.   FEEDING SUPPLEMENT, GLUCERNA SHAKE, (GLUCERNA SHAKE) LIQD    Take 237 mLs by mouth 3 (three) times daily between meals.   FOLIC ACID (FOLVITE) 1 MG TABLET    Take 1 tablet (1 mg total) by mouth daily.   GUAIFENESIN (MUCINEX) 600 MG 12 HR TABLET    Take 600 mg by mouth 2 (two) times daily.   IPRATROPIUM-ALBUTEROL (DUONEB) 0.5-2.5 (3) MG/3ML SOLN    Take 3 mLs by nebulization 2 (two) times daily. And Q6H PRN   LACTULOSE (CHRONULAC) 10 GM/15ML SOLUTION    Take 30 mLs (20 g total) by mouth 2 (two) times daily.   LAMOTRIGINE (LAMICTAL) 150 MG TABLET    Take 150 mg by mouth at bedtime.    LEVOTHYROXINE (SYNTHROID, LEVOTHROID) 75 MCG TABLET    Take 75 mcg by mouth daily.    MAGNESIUM OXIDE (MAG-OX) 400 MG TABLET    Take 400 mg by mouth 2 (two) times daily.   MONTELUKAST (SINGULAIR) 10 MG TABLET    Take 10 mg by mouth at bedtime.   MULTIPLE VITAMIN (MULTIVITAMIN WITH MINERALS) TABS  TABLET    Take 1 tablet by mouth daily.   PANTOPRAZOLE (PROTONIX) 40 MG TABLET    Take 40 mg by mouth daily.   POTASSIUM CHLORIDE (K-DUR) 10 MEQ TABLET    Take 10 mEq by mouth at bedtime.    PRAMOXINE-HC (HYDROCORTISONE ACE-PRAMOXINE) 2.5-1 % CREA    Apply 1 application topically 2 (two) times daily.   SAXAGLIPTIN-METFORMIN 2.06-998 MG TB24    Take 1 tablet by mouth 2 (two) times daily. Hold until blood glucose  greater than 150   SENNOSIDES-DOCUSATE SODIUM (SENOKOT-S) 8.6-50 MG TABLET    Take 2 tablets by mouth daily.   SERTRALINE (ZOLOFT) 100 MG TABLET    Take 100 mg by mouth at bedtime.    SPIRONOLACTONE (ALDACTONE) 25 MG TABLET    Take 1 tablet (25 mg total) by mouth daily.   THIAMINE 100 MG TABLET    Take 1 tablet (100 mg total) by mouth daily.   VITAMIN D, ERGOCALCIFEROL, (DRISDOL) 50000 UNITS CAPS CAPSULE    Take 50,000 Units by mouth every 7 (seven) days. Saturday  Modified Medications   No medications on file  Discontinued Medications   No medications on file     Physical Exam: BP 140/82 mmHg  Pulse 89  Temp(Src) 97.7 F (36.5 C)  Resp 18  SpO2 95%  General- elderly female, thin built, frail, in no acute distress Head- normocephalic, atraumatic Throat- moist mucus membrane Neck- no cervical lymphadenopathy Cardiovascular- normal s1,s2, no murmurs, palpable dorsalis pedis and radial pulses, 2+ leg edema Respiratory- bilateral clear to auscultation, no wheeze, no rhonchi, no crackles, no use of accessory muscles Abdomen- bowel sounds present, soft, non tender, distended Musculoskeletal- able to move all 4 extremities, generalized weakness, no asterexis Neurological- no focal deficit Skin- warm and dry, pallor present, bruising in both arms noted Psychiatry- alert and oriented to person, place and time, normal mood and affect    Labs reviewed: Basic Metabolic Panel:  Recent Labs  05/05/14 0545  05/16/14 0055 05/17/14 0600 05/18/14 0526  NA 134*  < > 122* 132* 133*   K 4.8  < > 4.4 4.2 4.3  CL 98  < > 87* 93* 93*  CO2 28  < > 26 29 31   GLUCOSE 78  < > 125* 131* 111*  BUN 22  < > 16 16 17   CREATININE 1.22*  < > 0.99 0.86 1.00  CALCIUM 8.0*  < > 7.8* 8.6 8.3*  MG 2.1  --   --   --   --   PHOS 3.3  --   --   --   --   < > = values in this interval not displayed. Liver Function Tests:  Recent Labs  05/04/14 1825 05/15/14 1729 05/16/14 0055  AST 38* 37 38*  ALT 17 26 24   ALKPHOS 198* 292* 273*  BILITOT 0.5 0.5 0.6  PROT 5.3* 6.1 5.8*  ALBUMIN 2.2* 2.9* 2.7*   No results for input(s): LIPASE, AMYLASE in the last 8760 hours.  Recent Labs  05/04/14 1219  AMMONIA 20   CBC:  Recent Labs  05/04/14 1140  05/15/14 1845 05/15/14 2305 05/16/14 0055 05/17/14 0600  WBC 5.6  < > 3.0* 3.1* 2.8* 4.2  NEUTROABS 3.7  --  1.9  --   --   --   HGB 10.1*  < > 8.9* 9.0* 9.6* 9.8*  HCT 30.7*  < > 26.8* 26.9* 28.9* 29.3*  MCV 97.5  < > 95.4 96.1 95.4 95.8  PLT 110*  < > 142* 129* PLATELET CLUMPS NOTED ON SMEAR, COUNT APPEARS DECREASED 125*  < > = values in this interval not displayed. Cardiac Enzymes:  Recent Labs  05/16/14 1135  CKTOTAL 38   BNP: Invalid input(s): POCBNP CBG:  Recent Labs  05/16/14 1704 05/17/14 0728 05/18/14 0732  GLUCAP 158* 138* 115*    Assessment/Plan  Physical deconditioning Will have her work with physical therapy and occupational therapy team to help with gait training and muscle strengthening exercises.fall precautions.  Skin care. Encourage to be out of bed.   Acute on chronic diastolic CHF  Appears euvolemic on exam. Continue bumex 1mg  daily and aldactone 25mg  daily with kcl supplement, monitor weight  Hepatic cirrhosis No encephalopathy at present. Continue lactulose 20g twice daily and aldactone 25mg  daily. Monitor her mental status, cbc  Bilateral leg edema Her chf and liver cirrhosis are contributing to this. Chronic, to wear ted hose and keep legs elevated. Continue bumex and aldactone.    Protein calorie malnutrition Monitor weight, encourage po intake, pressure ulcer prophylaxis  Type 2 dm a1c of 5.3. Her oral hypoglycemics are on hold at present, monitor cbg  Constipation Stable, on senna s and miralax with lactulose  COPD Chronic, stable, continue 02 by [place], symbicort and duoneb  Hypothyroidism Continue levothyroxine 42mcg daily  Chronic back pain Stable. Continue tylenol 500mg  every six hours as needed for moderate pain with buprenorphine 10cmg/hr patch transdermally once a week.   Depression with anxiety Mood stable. Continue lamictal 150mg  daily, abilify 5mg  daily, zoloft 100mg  daily and klonopin 0.5mg  daily.    Goals of care: short term rehabilitation   Family/ staff Communication: reviewed care plan with patient and nursing supervisor    Blanchie Serve, MD  [facility] [phone] (Monday-Friday 8 am - 5 pm) [phone] (afterhours)

## 2014-05-30 ENCOUNTER — Non-Acute Institutional Stay (SKILLED_NURSING_FACILITY): Payer: Medicare Other | Admitting: Registered Nurse

## 2014-05-30 DIAGNOSIS — R1030 Lower abdominal pain, unspecified: Secondary | ICD-10-CM

## 2014-05-30 DIAGNOSIS — R112 Nausea with vomiting, unspecified: Secondary | ICD-10-CM

## 2014-05-30 DIAGNOSIS — R000 Tachycardia, unspecified: Secondary | ICD-10-CM | POA: Diagnosis not present

## 2014-05-30 NOTE — Progress Notes (Signed)
Patient ID: Samantha Mcmillan, female   DOB: [birth year], 59 y.o.   MRN: [id number]   Place of Service: [facility]  Allergies  Allergen Reactions  . Iodine Shortness Of Breath  . Latex Anaphylaxis  . Oxycodone Shortness Of Breath  . Percocet [Oxycodone-Acetaminophen] Shortness Of Breath  . Shellfish Allergy Anaphylaxis  . Amitriptyline Other (See Comments)    Mental changes  . Wellbutrin [Bupropion] Other (See Comments)    Mental changes  . Augmentin [Amoxicillin-Pot Clavulanate] Rash  . Codeine Rash  . Cortisone Rash  . Diphenhydramine Rash  . Other Rash    darvocet  . Vicodin [Hydrocodone-Acetaminophen] Rash    dizzy    Code Status: Full Code  Goals of Care: Longevity/STR  Chief Complaint  Patient presents with  . Acute Visit    n/v, abd pain    HPI 59 y.o. female with PMH of liver cirrohosis, HTN, advanced COPD, oxygen dependent, CHF, depression w/ anxiety among others is being seen for an acute visit at the request of PTA and nursing staff for the evaluation of n/v and tachycardia. Seen in room today. Patient reported having n/v x 3 days. N/v usually occur after eating and with activities, especially when she's trying to get up. Stated that zofran has been helpful with n/v. Also reported having lower abdominal pain-unable to describe further detail but stated that n/v and abd pain do not occur together. Denies fever, chills, headache, dizziness, increased shortness of breath, chest pain, palpitation, or diarrhea/constipation. Last bm this morning. Other ROS unremarkable.    Past Medical History  Diagnosis Date  . GERD (gastroesophageal reflux disease)   . Colon polyp   . Cirrhosis of liver   . Hypertension   . Depression   . COPD (chronic obstructive pulmonary disease)   . IBS (irritable bowel syndrome)   . On home oxygen therapy     "2L; 24/7" (05/04/2014)  . Family history of adverse reaction to anesthesia     "my sister doesn't wake up good when she's  put deep to sleep"  . High cholesterol   . Heart murmur   . Asthma   . Pneumonia "several times"  . Chronic bronchitis     "get it pretty much q yr"   . Sleep apnea     "can't tolerate CPAP" (05/04/2014)  . Type II diabetes mellitus   . History of blood transfusion     "related to my anemia"  . Anemia   . Iron deficiency anemia   . Headache     "more than a couple times/wk" (05/04/2014)  . Arthritis     "some in my feet" (05/04/2014)  . Chronic lower back pain   . Anxiety   . Acute on chronic diastolic CHF (congestive heart failure) 05/16/2014  . OCD (obsessive compulsive disorder)   . Panic attack   . PTSD (post-traumatic stress disorder)   . Chronic respiratory failure     Past Surgical History  Procedure Laterality Date  . Cesarean section  1979  . Cataract extraction w/ intraocular lens  implant, bilateral Bilateral 2005  . Back surgery    . Colonoscopy  2014  . Upper gastrointestinal endoscopy    . Posterior fusion cervical spine  2015    "rebuilt 3 of my neck vertebrae"  . Incision and drainage of wound Right 2009    "leg mauled  by dog"  . Tubal ligation  1979  . Dilation and curettage of uterus  History  Substance Use Topics  . Smoking status: Current Every Day Smoker -- 0.50 packs/day for 48 years    Types: Cigarettes  . Smokeless tobacco: Never Used  . Alcohol Use: No    Family History  Problem Relation Age of Onset  . Cancer Father     pancreatic      Medication List       This list is accurate as of: 05/30/14  2:46 PM.  Always use your most recent med list.               acetaminophen 500 MG tablet  Commonly known as:  TYLENOL  Take 1,000 mg by mouth every 6 (six) hours as needed for moderate pain or headache.     allopurinol 100 MG tablet  Commonly known as:  ZYLOPRIM  Take 100 mg by mouth 2 (two) times daily.     ARIPiprazole 5 MG tablet  Commonly known as:  ABILIFY  Take 5 mg by mouth daily.     atorvastatin 10 MG tablet    Commonly known as:  LIPITOR  Take 10 mg by mouth daily at 6 PM.     budesonide-formoterol 80-4.5 MCG/ACT inhaler  Commonly known as:  SYMBICORT  Inhale 2 puffs into the lungs 2 (two) times daily as needed (shortness of breath).     bumetanide 1 MG tablet  Commonly known as:  BUMEX  1 tablet daily or as directed.     buprenorphine 10 MCG/HR Ptwk patch  Commonly known as:  BUTRANS - dosed mcg/hr  Place 1 patch (10 mcg total) onto the skin once a week.     clonazePAM 0.5 MG tablet  Commonly known as:  KLONOPIN  Take 1 tablet (0.5 mg total) by mouth daily.     feeding supplement (GLUCERNA SHAKE) Liqd  Take 237 mLs by mouth 3 (three) times daily between meals.     folic acid 1 MG tablet  Commonly known as:  FOLVITE  Take 1 tablet (1 mg total) by mouth daily.     guaiFENesin 600 MG 12 hr tablet  Commonly known as:  MUCINEX  Take 600 mg by mouth 2 (two) times daily.     Hydrocortisone Ace-Pramoxine 2.5-1 % Crea  Apply 1 application topically 2 (two) times daily.     ipratropium-albuterol 0.5-2.5 (3) MG/3ML Soln  Commonly known as:  DUONEB  Take 3 mLs by nebulization 2 (two) times daily. And Q6H PRN     lactulose 10 GM/15ML solution  Commonly known as:  CHRONULAC  Take 30 mLs (20 g total) by mouth 2 (two) times daily.     lamoTRIgine 150 MG tablet  Commonly known as:  LAMICTAL  Take 150 mg by mouth at bedtime.     levothyroxine 75 MCG tablet  Commonly known as:  SYNTHROID, LEVOTHROID  Take 75 mcg by mouth daily.     magnesium oxide 400 MG tablet  Commonly known as:  MAG-OX  Take 400 mg by mouth 2 (two) times daily.     montelukast 10 MG tablet  Commonly known as:  SINGULAIR  Take 10 mg by mouth at bedtime.     multivitamin with minerals Tabs tablet  Take 1 tablet by mouth daily.     pantoprazole 40 MG tablet  Commonly known as:  PROTONIX  Take 40 mg by mouth daily.     potassium chloride 10 MEQ tablet  Commonly known as:  K-DUR  Take 10 mEq by mouth at  bedtime.     Saxagliptin-Metformin 2.06-998 MG Tb24  Take 1 tablet by mouth 2 (two) times daily. Hold until blood glucose greater than 150     sennosides-docusate sodium 8.6-50 MG tablet  Commonly known as:  SENOKOT-S  Take 2 tablets by mouth daily.     sertraline 100 MG tablet  Commonly known as:  ZOLOFT  Take 100 mg by mouth at bedtime.     spironolactone 25 MG tablet  Commonly known as:  ALDACTONE  Take 1 tablet (25 mg total) by mouth daily.     thiamine 100 MG tablet  Take 1 tablet (100 mg total) by mouth daily.     Vitamin D (Ergocalciferol) 50000 UNITS Caps capsule  Commonly known as:  DRISDOL  Take 50,000 Units by mouth every 7 (seven) days. Saturday        Physical Exam  BP 119/70 mmHg  Pulse 102  Temp(Src) 97.9 F (36.6 C)  Resp 18  SpO2 97%  Constitutional: Frail adult female in no acute distress. Appears older than stated age. Conversant and pleasant HEENT: Normocephalic and atraumatic. PERRL. EOM intact. No icterus. No nasal discharge or sinus tenderness. Oral mucosa moist. Posterior pharynx clear of any exudate or lesions.  Neck: Supple and nontender. No lymphadenopathy, masses, or thyromegaly. No JVD or carotid bruits. Cardiac: Normal S1, S2. Slightly tachycardic, HR 102 without appreciable murmurs, rubs, or gallops. Distal pulses intact. 1+ pitting edema of BLE Lungs: No respiratory distress. Breath sounds diminished with wheezes bilaterally. Oxygen at 2lpm via Jolly in place. Abdomen: Diminished bowel sounds in all quadrants. Mid Lower abdomen tender to palpation. No rebound tenderness.  Musculoskeletal: able to move all extremities with generalized weakness.  Skin: Warm and dry. Bruising noted on BUE. Petechiae noted on chest area.  Neurological: Alert and oriented to person, place, and time. No focal deficits.  Psychiatric: Judgment and insight adequate. Appropriate mood and affect.   Labs Reviewed  CBC Latest Ref Rng 05/17/2014 05/16/2014 05/15/2014  WBC  4.0 - 10.5 K/uL 4.2 2.8(L) 3.1(L)  Hemoglobin 12.0 - 15.0 g/dL 9.8(L) 9.6(L) 9.0(L)  Hematocrit 36.0 - 46.0 % 29.3(L) 28.9(L) 26.9(L)  Platelets 150 - 400 K/uL 125(L) PLATELET CLUMPS NOTED ON SMEAR, COUNT APPEARS DECREASED 129(L)    CMP Latest Ref Rng 05/18/2014 05/17/2014 05/16/2014  Glucose 70 - 99 mg/dL 111(H) 131(H) 125(H)  BUN 6 - 23 mg/dL 17 16 16   Creatinine 0.50 - 1.10 mg/dL 1.00 0.86 0.99  Sodium 135 - 145 mmol/L 133(L) 132(L) 122(L)  Potassium 3.5 - 5.1 mmol/L 4.3 4.2 4.4  Chloride 96 - 112 mmol/L 93(L) 93(L) 87(L)  CO2 19 - 32 mmol/L 31 29 26   Calcium 8.4 - 10.5 mg/dL 8.3(L) 8.6 7.8(L)  Total Protein 6.0 - 8.3 g/dL - - 5.8(L)  Albumin 3.6 - 4.8 g/dL - - -  Total Bilirubin 0.3 - 1.2 mg/dL - - 0.6  Alkaline Phos 39 - 117 U/L - - 273(H)  AST 0 - 37 U/L - - 38(H)  ALT 0 - 35 U/L - - 24    Lab Results  Component Value Date   HGBA1C 5.3 05/15/2014    Lab Results  Component Value Date   TSH 3.475 05/15/2014   Diagnostic Studies Reviewed 05/15/14: CXR: Patchy bilateral airspace opacity representing pulmonary edema or multifocal infection. Favor edema. Minimal improvement.  Assessment & Plan 1. Nausea and vomiting, vomiting of unspecified type 2. Lower abdominal pain  Continue zofran 4mg  twice daily as needed for n/v and protonix  40mg  daily . Check labs cbc w/ diff, cmp and stat KUB for further evaluation. Continue to monitor her status.     3. Tachycardia with 100 - 120 beats per minute HR at baseline 60-80s. Recent TSH normal. Will check cbc w/ dif and cmp. Continue to monitor her status.  Diagnostic Studies/Labs Ordered: cbc w/ diff, cmp, KUB  Family/Staff Communication Plan of care discussed with patient and nursing staff. Patient and nursing staff verbalized understanding and agree with plan of care. No additional questions or concerns reported.    Arthur Holms, MSN, AGNP-C [facility] [address] [place], [place] [address] [phone] [8am-5pm] After  hours: [phone]

## 2014-05-31 LAB — CBC AND DIFFERENTIAL
HCT: 26 % — AB (ref 36–46)
Hemoglobin: 8.6 g/dL — AB (ref 12.0–16.0)
Platelets: 124 10*3/uL — AB (ref 150–399)
WBC: 3.9 10^3/mL

## 2014-05-31 LAB — BASIC METABOLIC PANEL
BUN: 34 mg/dL — AB (ref 4–21)
CREATININE: 1.1 mg/dL (ref 0.5–1.1)
GLUCOSE: 120 mg/dL
Potassium: 5.4 mmol/L — AB (ref 3.4–5.3)
SODIUM: 128 mmol/L — AB (ref 137–147)

## 2014-06-01 ENCOUNTER — Non-Acute Institutional Stay (SKILLED_NURSING_FACILITY): Payer: Medicare Other | Admitting: Registered Nurse

## 2014-06-01 ENCOUNTER — Encounter: Payer: Self-pay | Admitting: Registered Nurse

## 2014-06-01 DIAGNOSIS — E871 Hypo-osmolality and hyponatremia: Secondary | ICD-10-CM | POA: Diagnosis not present

## 2014-06-01 DIAGNOSIS — D649 Anemia, unspecified: Secondary | ICD-10-CM

## 2014-06-01 DIAGNOSIS — R112 Nausea with vomiting, unspecified: Secondary | ICD-10-CM | POA: Diagnosis not present

## 2014-06-01 DIAGNOSIS — E875 Hyperkalemia: Secondary | ICD-10-CM | POA: Diagnosis not present

## 2014-06-01 NOTE — Progress Notes (Signed)
Patient ID: Samantha Mcmillan, female   DOB: Sep 09, 1955, 59 y.o.   MRN: [id number]   Place of Service: [facility]  Allergies  Allergen Reactions  . Iodine Shortness Of Breath  . Latex Anaphylaxis  . Oxycodone Shortness Of Breath  . Percocet [Oxycodone-Acetaminophen] Shortness Of Breath  . Shellfish Allergy Anaphylaxis  . Amitriptyline Other (See Comments)    Mental changes  . Wellbutrin [Bupropion] Other (See Comments)    Mental changes  . Augmentin [Amoxicillin-Pot Clavulanate] Rash  . Codeine Rash  . Cortisone Rash  . Diphenhydramine Rash  . Other Rash    darvocet  . Vicodin [Hydrocodone-Acetaminophen] Rash    dizzy    Code Status: Full Code  Goals of Care: Longevity/STR  Chief Complaint  Patient presents with  . Acute Visit    f/u labs and n/v    HPI 59 y.o. female with PMH of liver cirrohosis, HTN, advanced COPD, oxygen dependent, CHF, depression w/ anxiety among others is being seen for an acute visit for f/u on recent labs and worsening of n/v. Seen in room today. Reported having emesis on and off over the past few days but about 5x last night, brown in color. Labs showed hyperkalemia (K 5.4), hypontremia (Na 128), and worsening of anemia (Hgb 8.6 from 9.8 on 05/17/14).  KUB on 05/30/14 showed nonspecific, but nonobstructive appearing bowel gas pattern; Correlate clinically for possible mild ileus or enteritis type process. Denies fever, chills, headache, dizziness, increased shortness of breath, chest pain, palpitation, or diarrhea/constipation. Last BM yesterday.   Past Medical History  Diagnosis Date  . GERD (gastroesophageal reflux disease)   . Colon polyp   . Cirrhosis of liver   . Hypertension   . Depression   . COPD (chronic obstructive pulmonary disease)   . IBS (irritable bowel syndrome)   . On home oxygen therapy     "2L; 24/7" (05/04/2014)  . Family history of adverse reaction to anesthesia     "my sister doesn't wake up good when she's put  deep to sleep"  . High cholesterol   . Heart murmur   . Asthma   . Pneumonia "several times"  . Chronic bronchitis     "get it pretty much q yr"   . Sleep apnea     "can't tolerate CPAP" (05/04/2014)  . Type II diabetes mellitus   . History of blood transfusion     "related to my anemia"  . Anemia   . Iron deficiency anemia   . Headache     "more than a couple times/wk" (05/04/2014)  . Arthritis     "some in my feet" (05/04/2014)  . Chronic lower back pain   . Anxiety   . Acute on chronic diastolic CHF (congestive heart failure) 05/16/2014  . OCD (obsessive compulsive disorder)   . Panic attack   . PTSD (post-traumatic stress disorder)   . Chronic respiratory failure     Past Surgical History  Procedure Laterality Date  . Cesarean section  1979  . Cataract extraction w/ intraocular lens  implant, bilateral Bilateral 2005  . Back surgery    . Colonoscopy  2014  . Upper gastrointestinal endoscopy    . Posterior fusion cervical spine  2015    "rebuilt 3 of my neck vertebrae"  . Incision and drainage of wound Right 2009    "leg mauled  by dog"  . Tubal ligation  1979  . Dilation and curettage of uterus  History  Substance Use Topics  . Smoking status: Current Every Day Smoker -- 0.50 packs/day for 48 years    Types: Cigarettes  . Smokeless tobacco: Never Used  . Alcohol Use: No    Family History  Problem Relation Age of Onset  . Cancer Father     pancreatic      Medication List       This list is accurate as of: 06/01/14  9:59 AM.  Always use your most recent med list.               acetaminophen 500 MG tablet  Commonly known as:  TYLENOL  Take 1,000 mg by mouth every 6 (six) hours as needed for moderate pain or headache.     allopurinol 100 MG tablet  Commonly known as:  ZYLOPRIM  Take 100 mg by mouth 2 (two) times daily.     ARIPiprazole 5 MG tablet  Commonly known as:  ABILIFY  Take 5 mg by mouth daily.     atorvastatin 10 MG tablet    Commonly known as:  LIPITOR  Take 10 mg by mouth daily at 6 PM.     budesonide-formoterol 80-4.5 MCG/ACT inhaler  Commonly known as:  SYMBICORT  Inhale 2 puffs into the lungs 2 (two) times daily as needed (shortness of breath).     bumetanide 1 MG tablet  Commonly known as:  BUMEX  1 tablet daily or as directed.     buprenorphine 10 MCG/HR Ptwk patch  Commonly known as:  BUTRANS - dosed mcg/hr  Place 1 patch (10 mcg total) onto the skin once a week.     clonazePAM 0.5 MG tablet  Commonly known as:  KLONOPIN  Take 1 tablet (0.5 mg total) by mouth daily.     feeding supplement (GLUCERNA SHAKE) Liqd  Take 237 mLs by mouth 3 (three) times daily between meals.     folic acid 1 MG tablet  Commonly known as:  FOLVITE  Take 1 tablet (1 mg total) by mouth daily.     guaiFENesin 600 MG 12 hr tablet  Commonly known as:  MUCINEX  Take 600 mg by mouth 2 (two) times daily.     Hydrocortisone Ace-Pramoxine 2.5-1 % Crea  Apply 1 application topically 2 (two) times daily.     ipratropium-albuterol 0.5-2.5 (3) MG/3ML Soln  Commonly known as:  DUONEB  Take 3 mLs by nebulization 2 (two) times daily. And Q6H PRN     lactulose 10 GM/15ML solution  Commonly known as:  CHRONULAC  Take 30 mLs (20 g total) by mouth 2 (two) times daily.     lamoTRIgine 150 MG tablet  Commonly known as:  LAMICTAL  Take 150 mg by mouth at bedtime.     levothyroxine 75 MCG tablet  Commonly known as:  SYNTHROID, LEVOTHROID  Take 75 mcg by mouth daily.     magnesium oxide 400 MG tablet  Commonly known as:  MAG-OX  Take 400 mg by mouth 2 (two) times daily.     montelukast 10 MG tablet  Commonly known as:  SINGULAIR  Take 10 mg by mouth at bedtime.     multivitamin with minerals Tabs tablet  Take 1 tablet by mouth daily.     ondansetron 4 MG tablet  Commonly known as:  ZOFRAN  Take 4 mg by mouth 2 (two) times daily as needed for nausea or vomiting.     pantoprazole 40 MG tablet  Commonly known as:  PROTONIX  Take 40 mg by mouth daily.     Saxagliptin-Metformin 2.06-998 MG Tb24  Take 1 tablet by mouth 2 (two) times daily. Hold until blood glucose greater than 150     sennosides-docusate sodium 8.6-50 MG tablet  Commonly known as:  SENOKOT-S  Take 2 tablets by mouth daily.     sertraline 100 MG tablet  Commonly known as:  ZOLOFT  Take 100 mg by mouth at bedtime.     spironolactone 25 MG tablet  Commonly known as:  ALDACTONE  Take 1 tablet (25 mg total) by mouth daily.     thiamine 100 MG tablet  Take 1 tablet (100 mg total) by mouth daily.     Vitamin D (Ergocalciferol) 50000 UNITS Caps capsule  Commonly known as:  DRISDOL  Take 50,000 Units by mouth every 7 (seven) days. Saturday        Physical Exam  BP 129/74 mmHg  Pulse 100  Temp(Src) 98 F (36.7 C)  Resp 18  SpO2 99%  Constitutional: Frail adult female in no acute distress. Appears older than stated age. Conversant and pleasant HEENT: Normocephalic and atraumatic. PERRL. EOM intact. No icterus. No nasal discharge or sinus tenderness. Oral mucosa moist. Posterior pharynx clear of any exudate or lesions.  Neck: Supple and nontender. No lymphadenopathy, masses, or thyromegaly. No JVD or carotid bruits. Cardiac: Normal S1, S2. Slightly tachycardic, HR 104 without appreciable murmurs, rubs, or gallops. Distal pulses intact. 1+ pitting edema of BLE Lungs: No respiratory distress. Breath sounds diminished with wheezes bilaterally. Abdomen: Bowel sounds present in all quadrants. Mid Lower abdomen tender to palpation. No rebound tenderness.  Musculoskeletal: able to move all extremities with generalized weakness.  Skin: Warm and dry. Bruising noted on BUE. Petechiae noted on chest area.  Neurological: Alert and oriented to person, place, and time. No focal deficits.  Psychiatric: Judgment and insight adequate. Appropriate mood and affect.   Labs Reviewed  CBC Latest Ref Rng 05/31/2014 05/17/2014 05/16/2014  WBC - 3.9  4.2 2.8(L)  Hemoglobin 12.0 - 16.0 g/dL 8.6(A) 9.8(L) 9.6(L)  Hematocrit 36 - 46 % 26(A) 29.3(L) 28.9(L)  Platelets 150 - 399 K/L 124(A) 125(L) PLATELET CLUMPS NOTED ON SMEAR, COUNT APPEARS DECREASED    CMP Latest Ref Rng 05/31/2014 05/18/2014 05/17/2014  Glucose 70 - 99 mg/dL - 111(H) 131(H)  BUN 4 - 21 mg/dL 34(A) 17 16  Creatinine 0.5 - 1.1 mg/dL 1.1 1.00 0.86  Sodium 137 - 147 mmol/L 128(A) 133(L) 132(L)  Potassium 3.4 - 5.3 mmol/L 5.4(A) 4.3 4.2  Chloride 96 - 112 mmol/L - 93(L) 93(L)  CO2 19 - 32 mmol/L - 31 29  Calcium 8.4 - 10.5 mg/dL - 8.3(L) 8.6  Total Protein 6.0 - 8.3 g/dL - - -  Albumin 3.6 - 4.8 g/dL - - -  Total Bilirubin 0.3 - 1.2 mg/dL - - -  Alkaline Phos 39 - 117 U/L - - -  AST 0 - 37 U/L - - -  ALT 0 - 35 U/L - - -    Lab Results  Component Value Date   HGBA1C 5.3 05/15/2014    Lab Results  Component Value Date   TSH 3.475 05/15/2014   Diagnostic Studies Reviewed 05/30/14: KUB: Nonspecific, but nonobstructive appearing bowel gas pattern; Correlate clinically for possible mild ileus or enteritis type process  05/15/14: CXR: Patchy bilateral airspace opacity representing pulmonary edema or multifocal infection. Favor edema. Minimal improvement.  Assessment & Plan 1. Hyperkalemia Discontinue kcl supplement. Repeat bmet  in a week. Continue to monitor  2. Hyponatremia Na 128. Continue to monitor for now  3. Anemia, unspecified anemia type Hgb dropped from 9.8 on 05/17/14 to 8.6 on 05/31/14. Guaiac stool and emesis x 3-nursing staff to document results on MAR and notify NP/MD with any positive findings. Repeat cbc in 1 week. Will also check ferritin iron studies, vit b12, folate for further evaluation.   4. Nausea and vomiting, vomiting of unspecified type Continue zofran 4mg  twice daily as needed for n/v and protonix 40mg  daily for GERD. Will start reglan 5mg  four times daily-69min before each meals and at bedtime. Continue to monitor her status.   Diagnostic  Studies/Labs Ordered: cbc, bmet, ferritin, iron panel, b12, folate  Family/Staff Communication Plan of care discussed with patient and nursing staff. Patient and nursing staff verbalized understanding and agree with plan of care. No additional questions or concerns reported.    Arthur Holms, MSN, AGNP-C [facility] [address] [place], [place] [address] [phone] [8am-5pm] After hours: [phone]

## 2014-06-06 ENCOUNTER — Non-Acute Institutional Stay (SKILLED_NURSING_FACILITY): Payer: Medicare Other | Admitting: Registered Nurse

## 2014-06-06 ENCOUNTER — Encounter: Payer: Self-pay | Admitting: Registered Nurse

## 2014-06-06 DIAGNOSIS — E871 Hypo-osmolality and hyponatremia: Secondary | ICD-10-CM

## 2014-06-06 DIAGNOSIS — E875 Hyperkalemia: Secondary | ICD-10-CM

## 2014-06-06 DIAGNOSIS — F418 Other specified anxiety disorders: Secondary | ICD-10-CM

## 2014-06-06 DIAGNOSIS — J449 Chronic obstructive pulmonary disease, unspecified: Secondary | ICD-10-CM | POA: Diagnosis not present

## 2014-06-06 DIAGNOSIS — D649 Anemia, unspecified: Secondary | ICD-10-CM

## 2014-06-06 DIAGNOSIS — R5381 Other malaise: Secondary | ICD-10-CM

## 2014-06-06 DIAGNOSIS — I5033 Acute on chronic diastolic (congestive) heart failure: Secondary | ICD-10-CM | POA: Diagnosis not present

## 2014-06-06 DIAGNOSIS — E119 Type 2 diabetes mellitus without complications: Secondary | ICD-10-CM | POA: Diagnosis not present

## 2014-06-06 DIAGNOSIS — K5900 Constipation, unspecified: Secondary | ICD-10-CM | POA: Diagnosis not present

## 2014-06-06 DIAGNOSIS — R600 Localized edema: Secondary | ICD-10-CM

## 2014-06-06 DIAGNOSIS — K7581 Nonalcoholic steatohepatitis (NASH): Secondary | ICD-10-CM

## 2014-06-06 DIAGNOSIS — E039 Hypothyroidism, unspecified: Secondary | ICD-10-CM

## 2014-06-06 DIAGNOSIS — K7460 Unspecified cirrhosis of liver: Secondary | ICD-10-CM

## 2014-06-06 DIAGNOSIS — M549 Dorsalgia, unspecified: Secondary | ICD-10-CM

## 2014-06-06 DIAGNOSIS — E785 Hyperlipidemia, unspecified: Secondary | ICD-10-CM

## 2014-06-06 DIAGNOSIS — E46 Unspecified protein-calorie malnutrition: Secondary | ICD-10-CM

## 2014-06-06 DIAGNOSIS — K7469 Other cirrhosis of liver: Secondary | ICD-10-CM

## 2014-06-06 DIAGNOSIS — G8929 Other chronic pain: Secondary | ICD-10-CM

## 2014-06-06 DIAGNOSIS — R112 Nausea with vomiting, unspecified: Secondary | ICD-10-CM | POA: Diagnosis not present

## 2014-06-06 NOTE — Progress Notes (Signed)
Patient ID: Samantha Mcmillan, female   DOB: [birth year], 59 y.o.   MRN: [id number]   Place of Service: [facility]  Allergies  Allergen Reactions  . Iodine Shortness Of Breath  . Latex Anaphylaxis  . Oxycodone Shortness Of Breath  . Percocet [Oxycodone-Acetaminophen] Shortness Of Breath  . Shellfish Allergy Anaphylaxis  . Amitriptyline Other (See Comments)    Mental changes  . Wellbutrin [Bupropion] Other (See Comments)    Mental changes  . Augmentin [Amoxicillin-Pot Clavulanate] Rash  . Codeine Rash  . Cortisone Rash  . Diphenhydramine Rash  . Other Rash    darvocet  . Vicodin [Hydrocodone-Acetaminophen] Rash    dizzy    Code Status: Full Code  Goals of Care: Longevity/STR  Chief Complaint  Patient presents with  . Discharge Note    HPI 59 y.o. female with PMH of liver cirrohosis, HTN, advanced COPD, oxygen dependent, CHF, depression w/ anxiety among others is being seen for a discharge visit. She was here for short term rehab post hospital admission from 05/15/14 to 05/18/14 for toxic metabolic encephalopathy thought to be from decompensated CHF, recent HCAP, liver cirrhosis and and polypharmacy. She has worked well with therapy team and is ready to be discharged home with [facility] PT/OT/RN/Aide and DME ([employer] and 3-1). Seen in room today. Denies any concern. Reported nausea/vomiting is much better.    Review of Systems Constitutional: Negative for fever and chills HENT: Negative for ear pain and sore throat Eyes: Negative for eye pain and eye discharge Cardiovascular: Negative for chest pain and palpitation. Positive for BLE swelling.  Respiratory: Positive for shortness of breath and wheezing (chronic, but better than before).  Gastrointestinal: Negative for nausea and vomiting. Positive for constipation Genitourinary: Negative for  dysuria, frequency, urgency, and hematuria Musculoskeletal: Positive for back pain (chronic) Neurological: Negative for dizziness  and headache. Positive for generalized weakness Skin: Negative for rash. Positive for skin tear on Left leg Psychiatric: Positive for depression. Negative for suicidal ideation.   Past Medical History  Diagnosis Date  . GERD (gastroesophageal reflux disease)   . Colon polyp   . Cirrhosis of liver   . Hypertension   . Depression   . COPD (chronic obstructive pulmonary disease)   . IBS (irritable bowel syndrome)   . On home oxygen therapy     "2L; 24/7" (05/04/2014)  . Family history of adverse reaction to anesthesia     "my sister doesn't wake up good when she's put deep to sleep"  . High cholesterol   . Heart murmur   . Asthma   . Pneumonia "several times"  . Chronic bronchitis     "get it pretty much q yr"   . Sleep apnea     "can't tolerate CPAP" (05/04/2014)  . Type II diabetes mellitus   . History of blood transfusion     "related to my anemia"  . Anemia   . Iron deficiency anemia   . Headache     "more than a couple times/wk" (05/04/2014)  . Arthritis     "some in my feet" (05/04/2014)  . Chronic lower back pain   . Anxiety   . Acute on chronic diastolic CHF (congestive heart failure) 05/16/2014  . OCD (obsessive compulsive disorder)   . Panic attack   . PTSD (post-traumatic stress disorder)   . Chronic respiratory failure     Past Surgical History  Procedure Laterality Date  . Cesarean section  1979  . Cataract extraction w/ intraocular  lens  implant, bilateral Bilateral 2005  . Back surgery    . Colonoscopy  2014  . Upper gastrointestinal endoscopy    . Posterior fusion cervical spine  2015    "rebuilt 3 of my neck vertebrae"  . Incision and drainage of wound Right 2009    "leg mauled  by dog"  . Tubal ligation  1979  . Dilation and curettage of uterus      History  Substance Use Topics  . Smoking status: Current Every Day Smoker -- 0.50 packs/day for 48 years    Types: Cigarettes  . Smokeless tobacco: Never Used  . Alcohol Use: No    Family History   Problem Relation Age of Onset  . Cancer Father     pancreatic      Medication List       This list is accurate as of: 06/06/14  5:57 PM.  Always use your most recent med list.               acetaminophen 500 MG tablet  Commonly known as:  TYLENOL  Take 1,000 mg by mouth every 6 (six) hours as needed for moderate pain or headache.     allopurinol 100 MG tablet  Commonly known as:  ZYLOPRIM  Take 100 mg by mouth 2 (two) times daily.     ARIPiprazole 5 MG tablet  Commonly known as:  ABILIFY  Take 5 mg by mouth daily.     atorvastatin 10 MG tablet  Commonly known as:  LIPITOR  Take 10 mg by mouth daily at 6 PM.     budesonide-formoterol 80-4.5 MCG/ACT inhaler  Commonly known as:  SYMBICORT  Inhale 2 puffs into the lungs 2 (two) times daily as needed (shortness of breath).     bumetanide 1 MG tablet  Commonly known as:  BUMEX  1 tablet daily or as directed.     buprenorphine 10 MCG/HR Ptwk patch  Commonly known as:  BUTRANS - dosed mcg/hr  Place 1 patch (10 mcg total) onto the skin once a week.     clonazePAM 0.5 MG tablet  Commonly known as:  KLONOPIN  Take 1 tablet (0.5 mg total) by mouth daily.     feeding supplement (GLUCERNA SHAKE) Liqd  Take 237 mLs by mouth 3 (three) times daily between meals.     folic acid 1 MG tablet  Commonly known as:  FOLVITE  Take 1 tablet (1 mg total) by mouth daily.     guaiFENesin 600 MG 12 hr tablet  Commonly known as:  MUCINEX  Take 600 mg by mouth 2 (two) times daily.     Hydrocortisone Ace-Pramoxine 2.5-1 % Crea  Apply 1 application topically 2 (two) times daily.     ipratropium-albuterol 0.5-2.5 (3) MG/3ML Soln  Commonly known as:  DUONEB  Take 3 mLs by nebulization every 4 (four) hours as needed. And Q6H PRN     lactulose 10 GM/15ML solution  Commonly known as:  CHRONULAC  Take 30 mLs (20 g total) by mouth 2 (two) times daily.     lamoTRIgine 150 MG tablet  Commonly known as:  LAMICTAL  Take 150 mg by mouth at  bedtime.     levothyroxine 75 MCG tablet  Commonly known as:  SYNTHROID, LEVOTHROID  Take 75 mcg by mouth daily.     magnesium oxide 400 MG tablet  Commonly known as:  MAG-OX  Take 400 mg by mouth 2 (two) times daily.  metoCLOPramide 5 MG tablet  Commonly known as:  REGLAN  Take 5 mg by mouth every 6 (six) hours as needed for nausea.     montelukast 10 MG tablet  Commonly known as:  SINGULAIR  Take 10 mg by mouth at bedtime.     multivitamin with minerals Tabs tablet  Take 1 tablet by mouth daily.     ondansetron 4 MG tablet  Commonly known as:  ZOFRAN  Take 4 mg by mouth 2 (two) times daily as needed for nausea or vomiting.     pantoprazole 40 MG tablet  Commonly known as:  PROTONIX  Take 40 mg by mouth daily.     sennosides-docusate sodium 8.6-50 MG tablet  Commonly known as:  SENOKOT-S  Take 2 tablets by mouth daily.     sertraline 100 MG tablet  Commonly known as:  ZOLOFT  Take 100 mg by mouth at bedtime.     spironolactone 25 MG tablet  Commonly known as:  ALDACTONE  Take 1 tablet (25 mg total) by mouth daily.     thiamine 100 MG tablet  Take 1 tablet (100 mg total) by mouth daily.     Vitamin D (Ergocalciferol) 50000 UNITS Caps capsule  Commonly known as:  DRISDOL  Take 50,000 Units by mouth every 7 (seven) days. Saturday        Physical Exam  BP 123/71 mmHg  Pulse 95  Temp(Src) 97 F (36.1 C)  Resp 17  Ht 5\' 9"  (1.753 m)  Wt 179 lb 6.4 oz (81.375 kg)  BMI 26.48 kg/m2  SpO2 99%  Constitutional: Frail adult female in no acute distress. Appears older than stated age. Conversant and pleasant HEENT: Normocephalic and atraumatic. PERRL. EOM intact. No icterus. Oral mucosa moist. Posterior pharynx clear of any exudate or lesions.  Neck: Supple and nontender. No lymphadenopathy, masses, or thyromegaly. No JVD or carotid bruits. Cardiac: Normal S1, S2. RRR without appreciable murmurs, rubs, or gallops. Distal pulses intact. 1-2+ pitting edema of  BLE Lungs: No respiratory distress. Breath sounds diminished with wheezes bilaterally. Oxygen at 2lpm via [place] in place. Abdomen: Audible bowel sounds in all quadrants. Soft, nontender, nondistended.  Musculoskeletal: able to move all extremities with generalized weakness.  Skin: Warm and dry. Bruising noted on BUE and chest area. Small ecchemotic area noted on LLE.  Neurological: Alert and oriented to person, place, and time. No focal deficits.  Psychiatric: Judgment and insight adequate. Appropriate mood and affect.   Labs Reviewed  CBC Latest Ref Rng 05/31/2014 05/17/2014 05/16/2014  WBC - 3.9 4.2 2.8(L)  Hemoglobin 12.0 - 16.0 g/dL 8.6(A) 9.8(L) 9.6(L)  Hematocrit 36 - 46 % 26(A) 29.3(L) 28.9(L)  Platelets 150 - 399 K/L 124(A) 125(L) PLATELET CLUMPS NOTED ON SMEAR, COUNT APPEARS DECREASED    CMP Latest Ref Rng 05/31/2014 05/18/2014 05/17/2014  Glucose 70 - 99 mg/dL - 111(H) 131(H)  BUN 4 - 21 mg/dL 34(A) 17 16  Creatinine 0.5 - 1.1 mg/dL 1.1 1.00 0.86  Sodium 137 - 147 mmol/L 128(A) 133(L) 132(L)  Potassium 3.4 - 5.3 mmol/L 5.4(A) 4.3 4.2  Chloride 96 - 112 mmol/L - 93(L) 93(L)  CO2 19 - 32 mmol/L - 31 29  Calcium 8.4 - 10.5 mg/dL - 8.3(L) 8.6  Total Protein 6.0 - 8.3 g/dL - - -  Albumin 3.6 - 4.8 g/dL - - -  Total Bilirubin 0.3 - 1.2 mg/dL - - -  Alkaline Phos 39 - 117 U/L - - -  AST 0 -  37 U/L - - -  ALT 0 - 35 U/L - - -    Lab Results  Component Value Date   HGBA1C 5.3 05/15/2014    Lab Results  Component Value Date   TSH 3.475 05/15/2014   Diagnostic Studies Reviewed 05/15/14: CXR: Patchy bilateral airspace opacity representing pulmonary edema or multifocal infection. Favor edema. Minimal improvement.  Assessment & Plan 1. Acute on chronic diastolic CHF (congestive heart failure) Appears euvolemic on exam. Continue bumex 1mg  daily and aldactone 25mg  daily. Continue daily weight and f/u with PCP  2. Bilateral leg edema TED hose to BLE while awake. Encourage elevating  BLE as much as possible to reduce swelling. Continue bumex and aldactone.   3. Advanced COPD Continue O2 at 2lpm via [place] as necessary to maintain 02 sat above 88%. Continue symbicort 80/4.54mcg 2 puffs twice daily with duoneb or albuterol hfa every four hours as needed for shortness of breath and wheezing. Continue to f/u with PCP  4. Hepatic cirrhosis/pancytopenia Secondary to probably NAFLD. Continue lactulose 20g twice daily and aldactone 25mg  daily.   5. Hypothyroidism, unspecified hypothyroidism type TSH normal. Continue levothyroxine 59mcg daily  6. Type 2 diabetes mellitus with diabetic polyneuropathy Recent a1c 5.3 on 05/15/14. Cbgs mostly 100s. Saxagliptin-metformin was on hold since admission to facility. Will discontinue saxagliptin-metformin.  7. Hyperkalemia K slightly elevated. Potassium discontinued. PCP to monitor lab  8. Constipation Continue sennakot-s 8.6/50mg  2 tabs daily and miralax 17g daily as needed. Continue lactulose twice daily. Encourage adequate hydration and ambulation as tolerated.   9. Protein-calorie malnutrition, severe Continue current diet and recommended dietary supplement (Glucerna)  10. Chronic back pain Stable. Continue tylenol 500mg  every six hours as needed for moderate pain (pt stated that she only uses tylenol when it is necessary due to liver cirrhosis)  with buprenorphine 10cmg/hr patch transdermally once a week.   11. Physical deconditioning Continue to work with [facility] PT/OT for gait/strenth/balance training to restore/maximize function. Fall risk precautions  12. Hyponatremia Last Na 128. PCP to monitor   13. Depression/anxiety/OCD/PTSD Mood stable. Continue lamictal 150mg  daily, abilify 5mg  daily, zoloft 100mg  daily and klonopin 0.5mg  daily. Continue to monitor for change in mood  14. Nausea and vomiting  Improves. Continue reglan 5mg  four times daily-30 min before each meals and at bedtime as needed or zofran 4mg  twice daily as needed.  Continue to f/u with PCP  15. HLD Continue lipitor 10mg  daily.   16. Anemia, unspecified Last hbg 8.6. Hemodynamically stable. Hemoccult negative. PCP to monitor h&h  Home health services: PT/OT/RN/Aide DME required: [employer], 3-1 PCP follow-up: Dr. Casilda Carls on 06/13/14 @ 10am 30-day supply of prescription medications provided. (#4 butrans 82mcg/hr patch)   Family/Staff Communication Plan of care discussed with patient and nursing staff. Patient and nursing staff verbalized understanding and agree with plan of care. No additional questions or concerns reported.    Arthur Holms, MSN, AGNP-C [facility] [address] [place], [place] [address] [phone] [8am-5pm] After hours: [phone]

## 2014-06-12 NOTE — Consult Note (Signed)
PATIENT NAME:  Samantha Mcmillan, Samantha Mcmillan MR#:  [id number] DATE OF BIRTH:  1955-09-16  DATE OF CONSULTATION:  10/16/2011  REFERRING PHYSICIAN:   CONSULTING PHYSICIAN:  Samantha Come. Kristle Wesch, MD  HISTORY OF PRESENT ILLNESS: [name] is a 59 year old patient who was seen for the first time in the [facility] and admitted to the hospital for packed red blood cell transfusion with symptomatic anemia. The patient has a history of recurring anemia, iron deficiency, and GI bleed. She was hospitalized most recently in June of 2013 with iron deficiency. She was not actively bleeding. The patient has been on oral iron which was recently changed from ferrous sulfate to ferrous gluconate polysaccharide iron. The patient sees Dr. Rosario Jacks for primary care and was evaluated on 10/14/2011. At that point hemoglobin was 8.3 from a prior level of 9.7. Hematology followup was planned. Office followup today found the hemoglobin of 7.5. The patient was referred over to the [facility].   The patient relates to me shortness of breath on walking more than 50 yards, but no shortness of breath at rest, no retrosternal chest pain, no palpitations. The patient also relates orthostasis and dizziness and unsteadiness on standing and walking so that she was fearful of falling and fearful of passing out and reported that the dizziness has been progressive but has been noticeable for at least a week. On the basis of clearly symptomatic anemia and the uncertainty about gastrointestinal blood loss and the uncertainty about how rapid iron supplement might be effective, patient is indicated for hospitalization for packed red blood cell transfusion. I consented patient to blood transfusion, discussed the risks and benefits of blood transfusions with her directly. Also discussed potential risks and benefits and side effects profile of intravenous iron which is planned as well.   Patient did not have fever, chills, or sweats. Has chronic obstructive  pulmonary disease with some intermittent and some old chronic but no significant recent cough. Is not short of breath at rest. No productive cough. No hemoptysis. No ear or jaw pain. No neck pain. Has some chronic arthritic discomfort, no reflux. Has a history of abdominal pain but no current abdominal pain. No dysuria or hematuria. There is some edema in the feet and ankles recently partially relieved by swelling. This is very mild. No dysuria, hematuria. No change in bowel habits. The stools are not dark even on oral iron.   PAST MEDICAL HISTORY: Includes hypothyroid, hyponatremia, hypomagnesemia, chronic obstructive pulmonary disease, gout, and arthritis, diabetes, hypertension. Also prior anemia. Documentation from old records include prior hospitalizations, colonoscopy in 2012, EGD in 2012. Diagnosis of AV malformations on a prior capsule endoscopy was referenced in a prior provider's note. Erosive gastritis by endoscopy in April 2012. Normal colonoscopy in February 2012. History of diverticulosis and irritable bowel and duodenal AVMs in 2009 on an endoscopy. Prior transfusions of blood for symptomatic anemia attributed to blood loss. Depression history, currently compensated. Additional medical history includes sleep apnea and overnight oxygen. Migraine headaches.   ALLERGIES: Listed to codeine, Phenergan, Benadryl, also amitriptyline, Augmentin, cortisone, Darvocet, iodine, Percocet, Wellbutrin, latex, and seafood.   CURRENT MEDICATIONS:  1. Abilify 5 mg daily.  2. Combivent 2 puffs every 6 hours p.r.n.  3. Glyburide 10 mg 1 tablet b.i.d.  4. Kombiglyze  XR 1000 mg/25 mg 1 tablet p.o. b.i.d.  5. Lamictal 150 mg 2 tablets at bedtime.  6. Lipitor 10 mg at bedtime.   7. Lisinopril 10 mg a day. 8. Lopressor 25 mg b.i.d.  9. Protonix 40 mg b.i.d.  10. Singulair 10 mg daily.  11. Symbicort 2 puffs b.i.d.  12. Zoloft 100 mg 1 tablet b.i.d.  13. Prednisone prior courses were tolerated.   SOCIAL  HISTORY: The patient smokes about a pack a day but has smoked for more than 20 years. No alcohol.   FAMILY HISTORY: The patient has 3 aunts, at least one was young in age 11s who had had breast cancer in the past and father had pancreatic cancer.   PHYSICAL EXAMINATION:   GENERAL: The patient was alert and cooperative, not in acute distress. Has pallor.   HEENT: Sclerae no jaundice. Mouth: No thrush.   LYMPH: No palpable lymph nodes in the neck, supraclavicular, or submandibular region.   LUNGS: Clear with decreased air entry symmetrically. No wheezing or rales.   HEART: Regular.   ABDOMEN: Obese, nontender. No palpable mass or organomegaly.   EXTREMITIES: Trace to 1+ edema of the feet and at the ankles. No calf tenderness.   NEUROLOGIC: Grossly nonfocal.    SKIN: No rash or bruising.   CLARIFICATION OF MEDICATIONS. Patient also takes hydromorphone 2 mg tablets at night for chronic back pain, gabapentin 300 mg at night, polysaccharide iron 150 mg daily, Lasix 20 mg either daily or p.r.n., not specified. Also Norvasc 5 mg 1 daily.  Hydromorphone 2 mg at bedtime p.r.n. for back pain.   IMPRESSION AND PLAN:  Patient with iron deficiency anemia and history of GI bleed and prior transfusions. Has hemoglobin of 7.5, dropped from 8.4 four days ago.  Patient is symptomatic with dizziness and orthostasis and shortness of breath on exertion so that the unit of packed red blood cells initially is appropriate. Consider additional transfusion if remains symptomatic. Then plan to give intravenous ferriheme as oral iron is not adequately maintaining patient. Risks and benefits of blood and side effect profile of intravenous iron were discussed with the patient.   PLAN:  1. Transfusion, continue usual medications. Assess the hemoglobin and the clinical status, assess the interval for followup later in the clinic.  2. Patient has history of splenomegaly diagnosed in April 2012 on an ultrasound.  On a  CT scan the spleen was also seen. It was measured larger on the CT. There was fatty liver at that time. There was 1 slightly enlarged lymph node but no significant adenopathy. Likely early cirrhosis from fatty liver or inflammatory spleen, unlikely significant hematology problem given the interval. It would be prudent to repeat a noncontrast CT of the abdomen and pelvis at some point.  3. Long-term smoking greater than 20 pack-years in a patient of appropriate age to screen for lung cancer so that chest CT noncontrast in addition to the abdominal scan will be appropriate at some point.  4. Family history of some malignancies including aunt with breast cancer at a young age, would be appropriate to review the patient's entire medical family history and consider further genetic advice.  5. Chronic obstructive pulmonary disease, currently compensated.  6. Medical issues including anxiety, depression, currently compensated.  7. After transfusion and iron, appropriate followup of the labs. We will plan CT scanning without contrast chest, abdomen and pelvis at some point and reviewing the family history with genetic counselor at some point in the future and establish if patient should get intravenous iron at intervals. Would then reconsult GI, and if this is any indication for additional workup it looks like patient may have been maximally worked up in the past.  ____________________________ Samantha Come Inez Pilgrim, MD rgg:vtd D: 10/16/2011 18:01:05 ET T: 10/17/2011 08:28:46 ET JOB#: [id number]  cc: Samantha Come. Inez Pilgrim, MD, <Dictator> Isla Pence, MD Dallas Schimke MD ELECTRONICALLY SIGNED 10/31/2011 20:50

## 2014-06-15 LAB — CBC CANCER CENTER
BASOS PCT: 1 %
Basophil #: 0 x10 3/mm (ref 0.0–0.1)
EOS PCT: 0.2 %
Eosinophil #: 0 x10 3/mm (ref 0.0–0.7)
HCT: 22.5 % — AB (ref 35.0–47.0)
HGB: 7.2 g/dL — AB (ref 12.0–16.0)
LYMPHS ABS: 0.5 x10 3/mm — AB (ref 1.0–3.6)
Lymphocyte %: 18.5 %
MCH: 28.3 pg (ref 26.0–34.0)
MCHC: 31.9 g/dL — ABNORMAL LOW (ref 32.0–36.0)
MCV: 89 fL (ref 80–100)
MONO ABS: 0.1 x10 3/mm — AB (ref 0.2–0.9)
Monocyte %: 4.2 %
Neutrophil #: 2 x10 3/mm (ref 1.4–6.5)
Neutrophil %: 76.1 %
Platelet: 138 x10 3/mm — ABNORMAL LOW (ref 150–440)
RBC: 2.54 10*6/uL — AB (ref 3.80–5.20)
RDW: 16.3 % — ABNORMAL HIGH (ref 11.5–14.5)
WBC: 2.6 x10 3/mm — AB (ref 3.6–11.0)

## 2014-06-15 LAB — IRON AND TIBC
IRON SATURATION: 3.3
Iron Bind.Cap.(Total): 331 (ref 250–450)
Iron: 11 ug/dL — ABNORMAL LOW
Unbound Iron-Bind.Cap.: 319.8

## 2014-06-15 LAB — FERRITIN: Ferritin (ARMC): 16 ng/mL

## 2014-06-15 NOTE — Op Note (Signed)
PATIENT NAME:  Samantha Mcmillan, Samantha Mcmillan MR#:  [id number] DATE OF BIRTH:  11-Oct-1955  DATE OF PROCEDURE:  09/06/2012  PREOPERATIVE DIAGNOSIS:  Deep volar ganglion, radial side of the left wrist.   POSTOPERATIVE DIAGNOSIS:  Deep volar ganglion, radial side of the left wrist.   PROCEDURE:  Excision of deep volar ganglion, left wrist.   SURGEON:  Park Breed, M.D.   ANESTHESIA:  General, LMA.   COMPLICATIONS:  None.   DRAINS:  None.   ESTIMATED BLOOD LOSS:  Minimal.  REPLACEMENTS:  None.   DESCRIPTION OF PROCEDURE: The patient was brought to the Operating Room where she underwent satisfactory general LMA anesthesia in the supine position. The left arm was prepped and draped in sterile fashion. An Esmarch was applied. The tourniquet was inflated to 250 mmHg. Tourniquet time was 23 minutes initially. It was let down for a short time and then reinflated for 9 minutes at the end. The transverse incision was made over the large volar radial ganglion in the left wrist. Dissection was carried out under loupe magnification carefully to protect vessels. The dissection was carried out very carefully exposing the radial artery, which was draped directly over the ganglion and intimately adherent to it. This was freed up and retracted with a vessel loop drain. The flexor tendon sheath of the radial and flexor carpi radialis was incised. The ganglion was shelled out 360 degrees and followed down to its origin at the scaphoradial joint. It was excised in its entirety. The joint capsule was debrided. The wound was irrigated. Sponges were applied to the wound and the tourniquet deflated with good return of blood flow to the hand. Radial artery was intact. There was mild bleeding, which was cauterized. The arm was elevated and the tourniquet reinflated for 9 minutes. The wound was irrigated and subcutaneous tissue closed with 4-0 Vicryl. The skin was closed with 5-0 nylon and 0.5% Marcaine was placed in the wound. Dry  sterile compression hand dressing and volar splint was applied. The tourniquet was deflated again with good return of blood flow to the hand. The patient was awakened and taken to recovery in good condition.   ____________________________ Park Breed, MD hem:dp D: 09/06/2012 11:48:03 ET T: 09/06/2012 12:27:14 ET JOB#: [id number]  cc: Park Breed, MD, <Dictator> Park Breed MD ELECTRONICALLY SIGNED 09/07/2012 8:58

## 2014-06-16 NOTE — H&P (Signed)
PATIENT NAME:  Samantha Mcmillan, Samantha Mcmillan MR#:  [id number] DATE OF BIRTH:  02-13-1956  DATE OF ADMISSION:  01/20/2014  PRIMARY CARE PHYSICIAN: Isla Pence, MD  REFERRING EMERGENCY ROOM PHYSICIAN: Randall Hiss, [place]    CHIEF COMPLAINT: Bilateral lower extremity swelling and redness.  HISTORY OF PRESENT ILLNESS: The patient is a 59 year old Caucasian female with a past medical history of COPD, diabetes, and multiple medical problems, is presenting to the ED with a chief complaint of bilateral lower extremity swelling and redness for the past 10 days. The pain has been getting worse and the patient is having difficulty with ambulation. She came into the ED, diagnosed with bilateral lower extremity cellulitis. Blood cultures were obtained, IV vancomycin was given, and hospitalist team is called to admit the patient. Bilateral lower extremity venous Dopplers are ordered to rule out DVT, which is pending at this time. No other complaints. Denies any fever. No sick contacts. No injuries or insect bites.   PAST MEDICAL HISTORY: Irritable bowel syndrome; diverticulosis; panic attacks; depression; obstructive sleep apnea, uses oxygen 2 liters at bedtime; COPD; diabetes mellitus,  non-insulin-requiring; hypertension; diagnosis of hypothyroidism.   PAST SURGICAL HISTORY: Bilateral cataract extraction, C-section, status post partial hysterectomy, right leg surgery due to a dog bite.   ALLERGIES: AMITRIPTYLINE, AUGMENTIN, BENADRYL, CODEINE, CORTISONE, DARVOCET IRON, PERCOCET, WELLBUTRIN, LASIX, AND SEAFOOD.   PSYCHOSOCIAL HISTORY: Lives at home, lives alone. Smokes 1 pack a day. Denies alcohol or illicit drug usage.   FAMILY HISTORY: Denies any history of coronary artery disease or stroke. Diabetes runs in her family.  REVIEW OF SYSTEMS: CONSTITUTIONAL: Denies any fever or fatigue. Complaining of bilateral lower extremity pain.  EYES: Denies blurry vision, double vision.  ENT: Denies epistaxis, discharge, ear pain,  hearing loss.  RESPIRATORY: Denies cough. Has chronic history of COPD, on 2 liters of oxygen at bedtime.  CARDIOVASCULAR: No chest pain or palpitations.  GASTROINTESTINAL: Denies any nausea, vomiting, diarrhea, abdominal pain. No hematemesis or melena. Has irritable bowel syndrome.  GENITOURINARY: No dysuria, hematuria, renal colic.  ENDOCRINE: Denies polyuria or nocturia. She has a history of hypothyroidism. HEMATOLOGIC AND LYMPHATICS: No anemia, easy bruising, or bleeding.  INTEGUMENTARY: No rash. Has bilateral lower extremity swelling, redness, and pain.  MUSCULOSKELETAL: No joint effusion. Denies any gout.  NEUROLOGICAL: Denies any vertigo or ataxia. No numbness.  PSYCHIATRIC: Has history of anxiety and depression.   HOME MEDICATIONS: The patient takes Abilify 5 mg 1-1/2 tablets p.o. once daily, allopurinol 100 mg 1 tablet p.o. 2 times a day, atorvastatin 10 mg p.o. at bedtime, buprenorphine transdermal film extended release 1 patch transdermally once a week, calcium with vitamin D once daily, Combivent 1 puff inhalation 2 times a day, cyclobenzaprine 10 mg p.o. once a day at bedtime, Dilaudid 2 mg 1 tablet p.o. every 6 hours as needed for pain, furosemide 40 mg p.o. 2 times a day, gabapentin 300 mg 1 tablet p.o. once daily at bedtime, glipizide 10 mg p.o. 2 times a day, Klor-Con 10 mEq p.o. once daily, kombiglyze 1000/2.5 mg extended release 2 times a day, Lamictal 150 mg p.o. once daily at bedtime, levothyroxine 75 mcg p.o. once daily, lisinopril 20 mg p.o. once daily, magnesium oxide 400 mg p.o. 2 times a day, metoprolol tartrate 50 mg p.o. 2 times a day, montelukast 10 mg p.o. once daily at bedtime,  Zoloft 100 mg p.o. 2 times a day, Symbicort 2 puff inhalation 2 times a day, vitamin D 50,000 international units 1 capsule once weekly.   PHYSICAL  EXAMINATION:  VITAL SIGNS: Temperature 97.9, pulse 86, respirations 16 to 18, blood pressure 140/63, pulse oximetry 96% on room air.  GENERAL  APPEARANCE: Not in acute distress. Moderately built and nourished.  HEENT: Normocephalic, atraumatic. Pupils are equally reacting to light and accommodation. No scleral icterus. No conjunctival injection. No sinus tenderness. No postnasal drip. Moist mucous membranes.  NECK: Supple. No JVD. No thyromegaly. Range of motion is intact.  LUNGS: Clear to auscultation bilaterally. No accessory muscle use. There is no anterior chest wall tenderness on palpation.  CARDIOVASCULAR: S1 and S2 is normal. Regular rate and rhythm. No murmur.  ABDOMEN: Soft. Bowel sounds are positive in all 4 quadrants. Nontender, nondistended. No hepatosplenomegaly. No rebound tenderness. No masses. NEUROLOGIC: Awake, alert, and oriented x 3. Cranial nerves II through XII are grossly intact. Motor and sensory are intact. Reflexes are 2+.  EXTREMITIES: Bilateral lower extremity tenderness. Edema and erythema are present. No weeping ulcers were noticed. PSYCHIATRIC: Normal mood and affect.   LABORATORIES AND IMAGING STUDIES: Chest x-ray: Interval borderline cardiomegaly, stable mild chronic interstitial lung disease. Bilateral lower extremity venous Dopplers are ordered, which are pending. Glucose 107. BNP 3900. Sodium 134, potassium 3.4, chloride 96, CO2 of 34, GFR greater than 60. Anion gap is 4. Serum osmolality normal. Calcium 7.9. LFTs are normal except albumin at 2.7 and alkaline phosphatase elevated at 204. Troponin 0.06 and repeat is 0.07. WBC 7.7, hemoglobin 10.1, hematocrit is 32.0, platelets 125,000. Urinalysis: Yellow in color, clear in appearance, nitrites negative, leukocyte esterase 3+.   ASSESSMENT AND PLAN: A 59 year old Caucasian female who presented to the Emergency Department with a chief complaint of bilateral lower extremity redness with swelling for the past 10 days, getting worse. The patient denies any chest pain or shortness of breath. Initial troponin is at 0.06, repeat 0.07.  1.  Bilateral lower extremity  cellulitis: We will admit her to a medical floor. We will put her on off-unit telemetry. Blood cultures were ordered. The patient will be on intravenous vancomycin. Bilateral lower extremity venous Dopplers were ordered, which are pending at this time. We will rule out deep venous thrombosis.  2.  BLLE edema with elevated BNP and troponin - ? rt sided CHF ? pt denies any chest pain or sob and takes lasix at home for BLLE edema. CXR - no pulm edema . Will get and echo and trend troponins. continue pO Lasix and kcl supplements - her home meds . Will get an ECHO to  evaluate LVEF, wall motion abnnormalities and valvular abnormalities 3.  Hypothyroidism: Continue Synthroid.  4.  Irritable bowel syndrome: We will resume her home medication.  5.  Obstructive sleep apnea: The patient is not on continuous positive airway pressure. We will continue home oxygen 2 liters via nasal cannula at bedtime.  6.  Chronic obstructive pulmonary disease, not in exacerbation: We will provide her nebulizer treatments and resume her home medications.  7.  Diabetes mellitus: We will continue glipizide and the patient will be on sliding scale insulin.  8.  Hypertension: Resume home medications and up titrate as needed basis. 9. Abnml Urinalysis - get urine culture and in the interim  will give levofloxacin  9.  We will provide her deep vein thrombosis prophylaxis.  10.  She is full code. Son is the medical power of attorney.  11.  Plan of care discussed with the patient. She verbalized understanding of the plan.   TOTAL TIME SPENT: 50 minutes.    ____________________________ Nicholes Mango, MD  ag:ts D: 01/20/2014 23:25:44 ET T: 01/21/2014 00:02:25 ET JOB#: [id number]  cc: Nicholes Mango, MD, <Dictator> Isla Pence, MD Nicholes Mango MD ELECTRONICALLY SIGNED 01/21/2014 1:12

## 2014-06-16 NOTE — Discharge Summary (Signed)
Dates of Admission and Diagnosis:  Date of Admission 20-Jan-2014   Date of Discharge 22-Jan-2014   Admitting Diagnosis leg cellulitis   Final Diagnosis leg cellulitis Acute on chronic Diastolic Heart failure Hypertension Diabetes mellitus Chronic obstructive Pulmonary disease    Chief Complaint/History of Present Illness a 59 year old Caucasian female with a past medical history of COPD, diabetes, and multiple medical problems, is presenting to the ED with a chief complaint of bilateral lower extremity swelling and redness for the past 10 days. The pain has been getting worse and the patient is having difficulty with ambulation. She came into the ED, diagnosed with bilateral lower extremity cellulitis. Blood cultures were obtained, IV vancomycin was given, and hospitalist team is called to admit the patient. Bilateral lower extremity venous Dopplers are ordered to rule out DVT, which is pending at this time. No other complaints. Denies any fever. No sick contacts. No injuries or insect bites.   Allergies:  Latex: Anaphylaxis, Rash  Iodine Strong: Wheezing  Benadryl: Rash  Codeine: Rash  Darvocet - N: Rash  Augmentin: Rash  Seafood: Swelling, Resp. Distress, Wheezing  Percocet: Rash, Swelling, Wheezing  Oxycodone: Rash, Wheezing, Swelling  Wellbutrin: Alt Ment Status, Agitation  Amitriptyline: Alt Ment Status, Agitation  Cortisone: Rash  Vicodin: Dizzy/Fainting, Rash  Tape: Other  Pertinent Past History:  Pertinent Past History Irritable bowel syndrome; diverticulosis; panic attacks; depression; obstructive sleep apnea, uses oxygen 2 liters at bedtime; COPD; diabetes mellitus,  non-insulin-requiring; hypertension; diagnosis of hypothyroidism.   Hospital Course:  Hospital Course 27 f with chronic obstructive pulmonary disease, Night O2, hypertension, DIABETES MELLITUS, Tobacco abuse, Anemia of Chronic Disease here with LE cellulitis  * Bilateral LE cellulitis On vancomycin.  Afebrile. negative cx results   will swich to oral on d/c.  * ac on Chronic diastolic congestive heart failure On lasix. Checked Echo   edema better now.  * Elevated troponin Minimal at 0.06 and stable on 3 checks. Likely from chf/copd  * hypertension - stable.  * DIABETES MELLITUS   Councelled aboyut better control. ISS.  * chronic obstructive pulmonary disease   no wheezing, On nebs.  * Anemia of Chronic Disease- stable.   Condition on Discharge Stable   Code Status:  Code Status Full Code   DISCHARGE INSTRUCTIONS HOME MEDS:  Medication Reconciliation: Patient's Home Medications at Discharge:     Medication Instructions  lamictal 150 mg tablet  1 tab(s) orally once a day (at bedtime)   montelukast 10 mg oral tablet  1 tab(s) orally once a day (at bedtime)   atorvastatin 10 mg oral tablet  1 tab(s) orally once a day (at bedtime)   glipizide extended release 10 mg oral tablet, extended release  1 tab(s) orally 2 times a day   lisinopril 20 mg oral tablet  1 tab(s) orally once a day   abilify 5 mg oral tablet  1.5 tab(s) orally once a day   gabapentin 300 mg/24 hours oral tablet, extended release  1 tab(s) orally once a day (at bedtime)   cyclobenzaprine 10 mg oral tablet  1 tab(s) orally once a day (at bedtime)   furosemide 40 mg oral tablet  1 tab(s) orally 2 times a day   kombiglyze xr 1000 mg-2.5 mg tablet, extended release  1 tab(s) orally 2 times a day (with meals)   levothyroxine 75 mcg (0.075 mg) oral tablet  1 tab(s) orally once a day   klor-con 10 10 meq oral tablet, extended release  1 tab(s) orally  once a day   dilaudid 2 mg oral tablet  1 tab(s) orally every 6 hours, As Needed - for Pain   vitamin d3 50,000 intl units oral capsule  1 cap(s) orally once a week   calcium 600+d 600 mg-200 units oral tablet  1 tab(s) orally once a day   allopurinol 100 mg oral tablet  1 tab(s) orally 2 times a day   buprenorphine 10 mcg/hr transdermal film, extended release  1  patch transdermal once a week   magnesium oxide 400 mg oral tablet  1 tab(s) orally 2 times a day   metoprolol tartrate 50 mg oral tablet  1 tab(s) orally 2 times a day   sertraline 100 mg oral tablet  1 tab(s) orally 2 times a day   combivent 103 mcg-18 mcg/inh aerosol with adapter  1 puff(s) inhaled 2 times a day   symbicort 80 mcg-4.5 mcg/inh inhalation aerosol  2 puff(s) inhaled 2 times a day   amoxicillin-clavulanate 875 mg-125 mg oral tablet  1 tab(s) orally every 12 hours x 6 days    STOP TAKING THE FOLLOWING MEDICATION(S):    pantoprazole 40 mg oral delayed release tablet: 1 tab(s) orally once a day  Physician's Instructions:  Diet Low Sodium  Carbohydrate Controlled (ADA) Diet   Activity Limitations As tolerated   Return to Work Not Applicable   Time frame for Follow Up Appointment 1-2 weeks  PMD     Rosario Jacks, Fayegh(Family Physician): [facility], [address], [place], [place] [address], [phone]  TIME SPENT:  Total Time: Greater than 30 minutes   Electronic Signatures: Vaughan Basta (MD)  (Signed 04-Dec-15 21:42)  Authored: ADMISSION DATE AND DIAGNOSIS, CHIEF COMPLAINT/HPI, Allergies, PERTINENT PAST HISTORY, HOSPITAL COURSE, DISCHARGE INSTRUCTIONS HOME MEDS, PATIENT INSTRUCTIONS, Follow Up Physician, TIME SPENT   Last Updated: 04-Dec-15 21:42 by Vaughan Basta (MD)

## 2014-06-16 NOTE — H&P (Signed)
PATIENT NAME:  Samantha Mcmillan, Samantha Mcmillan MR#:  [id number] DATE OF BIRTH:  Jan 15, 1956  DATE OF ADMISSION:  01/20/2014  ADDENDUM  The patient is reporting that she smokes 1 pack per day. The patient was counseled to quit smoking. Approximately 4 to 5 minutes of cessation counseling was provided. She is not considering nicotine patch at this time.   ____________________________ Nicholes Mango, MD ag:ST D: 01/20/2014 23:32:41 ET T: 01/20/2014 23:57:13 ET JOB#: [id number]  cc: Nicholes Mango, MD, <Dictator> Nicholes Mango MD ELECTRONICALLY SIGNED 01/21/2014 1:12

## 2014-06-17 NOTE — H&P (Signed)
PATIENT NAME:  Samantha Mcmillan, Samantha Mcmillan MR#:  [id number] DATE OF BIRTH:  03-Dec-1955  DATE OF ADMISSION:  08/17/2011  PRIMARY CARE PHYSICIAN: Dr. Casilda Carls. ED REFERRING PHYSICIAN: Dr. Lenise Arena.   CHIEF COMPLAINT: Headaches, weakness and difficulty concentrating, ambulatory dysfunction.   HISTORY OF PRESENT ILLNESS: The patient is a 59 year old white female with history of irritable bowel syndrome, depression, sleep apnea, chronic obstructive pulmonary disease, diabetes, hypertension, who reports that has not felt good for the past two weeks. She reports that she does have a history of migraine headaches and has had progressive headaches for the past two weeks. They come and go and sometimes they are worse, sometimes they are better. The patient was seen by her primary care provider for a swelling in her elbow. At that time was prescribed prednisone. Also, blood work was done and showed that she had a sodium of 124. Also, was noted to have an elevated potassium of 5.2. The patient continued to have the headaches. She also started having trouble with loss of balance. She reports "nothing feels right in my body". She also states that she has lack of appetite. She describes the headache as bifrontal and sharp in nature without any associated vision loss. She does have some associated nausea. She also complains of chronic shortness of breath, which is unchanged. She also has chronic cough, which is unchanged. The patient was recently prescribed prednisone for the swelling in her elbow. Today would be her last day to finish the course. She reports that she thinks that she does not eat or drink properly. She reports that she drinks about four bottles of water as well as two 12 ounce Pepsi. She has not had any diarrhea or vomiting. Denies any fevers or chills. Denies any asymmetrical weakness. Denies any visual difficulties.   PAST MEDICAL HISTORY:  1. History of urinary tract infection.  2. History of  irritable bowel syndrome.  3. Diverticulosis.  4. Panic attacks.  5. Depression.  6. History of sleep apnea, just uses O2 at nighttime.  7. Chronic obstructive pulmonary disease.  8. Diabetes.  9. Hypertension.  10. She reports a recent diagnosis of hypothyroidism, but currently not on any treatment.  11. History of gastritis requiring admission in April 2012.   PAST SURGICAL HISTORY:  1. Status post bilateral cataract extraction.  2. Status post cesarean section.  3. Status post partial hysterectomy.  4. History of right leg surgery due to dog bite.   ALLERGIES: Amitriptyline, Augmentin, Benadryl, codeine, cortisone, Darvocet, iodine, Percocet, Wellbutrin, latex, seafood.   CURRENT HOME MEDICATIONS:  1. Abilify 5 mg daily.  2. Combivent 2 puffs every six hours p.r.n.  3. Glipizide 10 mg 1 tab p.o. b.i.d.  4. Kombiglyze XR 1000 mg/2.5 mg 1 tab p.o. b.i.d.  5. Lamictal 150, two tabs at bedtime.  6. Lipitor 10 mg at bedtime.  7. Lisinopril 10 daily.  8. Lopressor 25, one tab p.o. b.i.d.  9. Prednisone which she just finished a course.  10. Protonix 40 mg 1 tab p.o. b.i.d.  11. Singular 10 mg 1 tab p.o. daily. 12. Symbicort 2 puffs b.i.d.  13. Zoloft 100, one tab p.o. b.i.d.   SOCIAL HISTORY: Continues to smoke one pack per day. Denies alcohol, tobacco or drug abuse.   FAMILY HISTORY: Negative for coronary artery disease or stroke.   REVIEW OF SYSTEMS: CONSTITUTIONAL: Denies any fevers. Complains of fatigue, weakness, headache. No weight loss. No weight gain. EYES: No blurred or double vision. No pain.  No redness. No inflammation. No glaucoma. No cataracts. ENT: No tinnitus. No ear pain. No hearing loss. No seasonal or year-round allergies. No difficulty with swallowing. RESPIRATORY: Does have chronic cough and has chronic obstructive pulmonary disease. CARDIOVASCULAR: Denies any chest pain, orthopnea, or edema. Has high blood pressure. GASTROINTESTINAL: Does complain of chronic  nausea. Denies any vomiting or diarrhea. Denies abdominal pain. No hematemesis. Does have irritable bowel syndrome. Does complain of chronic constipation. Denies any rectal bleeding. GENITOURINARY: Denies any dysuria, hematuria, renal calculus. ENDOCRINE: Denies any polydipsia, nocturia, recent diagnosis of low thyroid according to her, but not on any supplements. Denies increase in sweating, heat or cold intolerance. HEME/LYMPH: Denies anemia, easy bruisability, or bleeding. SKIN: No acne. No rash. No changes in mole, hair or skin. MUSCULOSKELETAL: Has pain related to osteoarthritis. No gout. NEUROLOGIC: No numbness. No cerebrovascular accident. No transient ischemic attack. PSYCHIATRIC: Has anxiety, depression.   PHYSICAL EXAMINATION:  GENERAL: The patient is a 59 year old white female in no acute distress.   HEENT: Head atraumatic, normocephalic. Pupils equally round, reactive to light and accommodation. Extraocular movements intact. There is no conjunctival pallor. No scleral icterus. Oropharynx is clear without any exudates.   NECK: No thyromegaly. No carotid bruits.   CARDIOVASCULAR: Regular rate and rhythm. No murmurs, rubs, clicks, or gallops.   LUNGS: No accessory muscle usage. Clear to auscultation bilaterally without any rales, rhonchi, or wheezing.   ABDOMEN: Soft, nontender, nondistended. Positive bowel sounds x4. There is no hepatosplenomegaly.   EXTREMITIES: He does have trace edema.   SKIN: There is no rash.   LYMPHATICS: No lymph nodes palpable.   NEUROLOGICAL: The patient is currently awake, alert, oriented x3. Cranial nerves II through XII grossly intact. No focal deficits.   LABORATORY, DIAGNOSTIC, AND RADIOLOGICAL DATA: Glucose 129, BUN 17, creatinine 1.20, sodium 121, potassium 4.2, chloride 86, CO2 28, calcium 8.8. LFTs showed albumin of 3.1, bilirubin direct 0.3, alkaline phosphatase 162. Troponin less than 0.02. WBC 11.5, hemoglobin 11.1, platelet count is 208. CT scan  of the head shows no acute abnormality. Chest x-ray: Official report is pending.   ASSESSMENT AND PLAN: The patient is 59 year old white female with history of chronic obstructive pulmonary disease, diabetes, hypertension, irritable bowel syndrome who presents with multiple complaints including headache, weakness unsteady gait, difficulty with concentrated, noted to have low sodium.  1. Hyponatremia, possibly due to volume depletion. At this time, we will give her IV fluids. She is not on any obvious medications that would cause hyponatremia. She is not on any diuretics. We will also check serum osmolality and urine osmolality to make sure she does not have any other pathology such as SIADH.  2. Diabetes. We will continue glipizide and Kombiglyze. Place her on sliding scale insulin.  3. Headache, likely due to tension headache, migraine headache. Will do p.r.n. pain medications.  4. Chronic obstructive pulmonary disease. We will continue Combivent, Symbicort. We will place her on p.r.n. nebulizers.  5. Sleep apnea. Continue oxygen at bedtime.  6. Depression/anxiety. Continue Lamictal as taking at home.  7. Miscellaneous. Will place her on heparin for deep vein thrombosis prophylaxis.   TIME SPENT: 35 minutes spent.  ____________________________ Lafonda Mosses. Posey Pronto, MD shp:ap D: 08/17/2011 21:09:17 ET T: 08/18/2011 07:08:20 ET JOB#: [id number]  cc: Oniyah Rohe H. Posey Pronto, MD, <Dictator> Isla Pence, MD Alric Seton MD ELECTRONICALLY SIGNED 08/20/2011 16:36

## 2014-06-17 NOTE — Discharge Summary (Signed)
PATIENT NAME:  Samantha Mcmillan, Samantha Mcmillan MR#:  [id number] DATE OF BIRTH:  October 10, 1955  DATE OF ADMISSION:  08/17/2011 DATE OF DISCHARGE:  08/19/2011  ADMISSION DIAGNOSIS: Hyponatremia.   DISCHARGE DIAGNOSES:  1. Hyponatremia, likely from HCTZ.  2. Diabetes. 3. Headache. 4. History of chronic obstructive pulmonary disease.  5. Iron deficiency anemia.  6. Hypomagnesemia.   CONSULTANTS: Murlean Iba, MD - Nephrology.   LABORATORY DATA: Discharge sodium 131, potassium 4.6, chloride 97, bicarbonate 26, BUN 11, creatinine 0.92, and glucose 128. Magnesium 1.4, which was repleted.   TSH was normal. Ferritin 64 and TIBC 284.   HOSPITAL COURSE: This is a 59 year old female who presents with headache and weakness and was found to have a low sodium level. For details please refer to the history and physical.  1. Hyponatremia, likely from HCTZ with some hypovolemia. We appreciate nephrology consult. The patient has improved with IV fluid and her sodium is up to 131. She will need follow-up with Dr. Casilda Carls in two days for repeat level. We will hold HCTZ for now.  2. Diabetes. The patient can resume her outpatient medications. 3. Headache, due to tension/migraine headaches. The patient's headaches were fine while hospitalized.  4. History of chronic obstructive pulmonary disease, which is stable. The patient will resume her outpatient inhalers. 5. Iron deficiency anemia. She has a yearly colonoscopies and no cancer per the patient. She will need follow-up for her hemoglobin as well as iron deficiency anemia.  6. Hypomagnesemia, which was repleted at discharge.    DISCHARGE MEDICATIONS:  1. Zoloft 100 mg twice a day. 2. Lipitor 10 mg at bedtime.  3. Singulair 10 mg daily.  4. Lopressor 50 mg 1/2 tablet twice a day. 5. Glipizide 10 mg twice a day. 6. Kombiglyze SR 1000 mg/2.5 mg twice a day. 7. Protonix 40 mg twice a day. 8. Combivent 2 puffs every 6 hours. 9. Lisinopril 10 mg daily.   10. Abilify 5 mg 1-1/2 tablets daily. 11. Lamictal 150 mg 1-1/2 tablets at bedtime.  12. Norvasc 5 mg daily.  13. Gabapentin 300 mg at bedtime.  14. Iron polysaccharide 150 mg daily.  15. Ciprofloxacin 250 mg twice a day for five days.  16. Nicotine patch 14 mg/24 hours.  DISCHARGE DIET: Low sodium.   DISCHARGE ACTIVITY: As tolerated.   DISCHARGE FOLLOWUP: Followup with Dr. Casilda Carls in 1 to 2 days.  TIME SPENT: 35 minutes.  ____________________________ Donell Beers. Benjie Karvonen, MD spm:slb D: 08/19/2011 11:23:46 ET T: 08/20/2011 11:19:34 ET JOB#: [id number]  cc: Shanielle Correll P. Benjie Karvonen, MD, <Dictator> Isla Pence, MD Donell Beers Halli Equihua MD ELECTRONICALLY SIGNED 08/20/2011 11:59

## 2014-06-18 LAB — CANCER CENTER HEMOGLOBIN: HGB: 7.5 g/dL — ABNORMAL LOW (ref 12.0–16.0)

## 2014-06-21 LAB — CANCER CENTER HEMOGLOBIN: HGB: 7.7 g/dL — ABNORMAL LOW (ref 12.0–16.0)

## 2014-06-24 NOTE — H&P (Signed)
PATIENT NAME:  Samantha Mcmillan, Samantha Mcmillan MR#:  [id number] DATE OF BIRTH:  1955/05/31  DATE OF ADMISSION:  04/11/2014  REFERRING PHYSICIAN: Loney Hering, MD  PRIMARY CARE PHYSICIAN: Isla Pence, MD   ADMISSION DIAGNOSIS: Weakness and rectal bleeding.   HISTORY OF PRESENT ILLNESS: This is a 59 year old Caucasian female who presents to the Emergency Department originally complaining of lower extremity edema and weakness. The patient states that she feels uncoordinated when trying to walk and notably has become so weak that she was unable to stand up after sitting on the commode. Her caregiver at home cannot lift her. These findings are shortly after a discharge from [facility] after diuresis of 12 L of ascites and lower extremity edema. She has not had any Lasix since that time, and has noted decreased urine output. (Notably, the patient has other diuretics on her medication reconciliation list but points out that she had gained at least 6 pounds since discharge less than 1 week ago). While here in the Emergency Department, the patient has had some bright red bleeding per rectum. The patient states that she noticed blood in her bowel movements approximately 3 weeks ago. She told her gastroenterologist who declined to operate at that time due to fear of exsanguination due to the patient's underlying cirrhosis of the liver. The patient states that the bleeding is painful. She has had bleeding before that required blood transfusion, but has now started IV iron infusions instead of blood transfusion. Due to her persistent weakness and continued bleeding per rectum, the Emergency Department called for admission.   REVIEW OF SYSTEMS:  CONSTITUTIONAL: The patient denies fever, but admits to extreme fatigue.  EYES: Denies blurred vision or inflammation.  EARS, NOSE AND THROAT: Denies tinnitus or sore throat.  RESPIRATORY: Admits to gradually increasing progressive shortness of breath without oxygen. (She  is now currently on oxygen at all times since her previous hospital admission). She denies cough.  CARDIOVASCULAR: The patient admits to constant chest pain, which is essentially always there. It is not associated with exercise and she does not notice it sometimes, but she always states that it is present. She denies palpitations but does admit at times to orthopnea, but denies paroxysmal nocturnal dyspnea.  GASTROINTESTINAL: Admits to nausea sometimes, but denies vomiting, diarrhea, or abdominal pain.  GENITOURINARY: The patient denies dysuria, increased frequency, or hesitancy, in fact, she notes decreased frequency of urination now.  ENDOCRINE: Denies polyuria or polydipsia.  HEMATOLOGIC AND LYMPHATIC: Admits to easy bruising and easy bleeding.  INTEGUMENTARY: Denies rashes or lesions.  MUSCULOSKELETAL: Denies arthralgias or myalgias.  NEUROLOGIC: Denies numbness in her extremities or difficulty speaking.  PSYCHIATRIC: Denies depression or suicidal ideation (although notably depression is part of the patient's past medical history).   PAST MEDICAL HISTORY: Hepatitis C, hypothyroidism, hypertension, diabetes type 2, COPD, anemia, spinal stenosis, gait instability, and depression.   PAST SURGICAL HISTORY: C-sections with subsequent uterine-packing, dilatation and curettage, I and D of the right lower extremity from a dog bite, cervical kyphoplasty.   SOCIAL HISTORY: The patient smokes 1/2 pack per day. She does not drink or do any drugs. She has personal care services at home as well as physical therapy at home.   FAMILY HISTORY: The patient's father is deceased of pancreatic cancer and her mother is deceased from complications of diabetes, coronary artery disease, and hypertension.   MEDICATIONS:  1.  Albuterol 2.5 mg/3 mL inhaled solution 1 nebulizer treatment every 6 hours as needed for coughing,  wheezing, or shortness of breath.  2.  Allopurinol 100 mg 1 tablet p.o. daily.  3.   Aripiprazole 5 mg 1-1/2 tablets p.o. daily.  4.  Atorvastatin 10 mg 1 tablet p.o. at bedtime.  5.  Budesonide and formoterol 80 mcg/4.5 mcg per inhalation 2 puffs inhaled 2 times a day.  6.  Bumetanide 1 mg take 2 tablets every morning and 1 tablet every afternoon.  7.  Buprenorphine 10 mcg/hour transdermal film, place 1 extended release patch transdermally per week.  8.  Combivent 103 mcg/18 mcg/inhalations 1 puff inhaled 2 times a day.  9.  Cyclobenzaprine 10 mg 1 tablet p.o. every 8 hours.  10.  Dilaudid 2 mg 1 tablet p.o. every 6 hours as needed for pain.  11.  Gabapentin 300 mg per 24 hours 1 tablet p.o. at bedtime.  12.  Glipizide extended release 10 mg 1 tablet p.o. b.i.d.  13.  Hemorrhoidal ointment 0.25 apply to rectum 2 times a day.  14.  Potassium chloride 20 mEq extended release 1 tablet p.o. b.i.d. for 1 week or as directed further by primary care physician.  15.  Kombiglyze extended release 1000 mg/2.5 mg tablet 1 tablet p.o. b.i.d. with meals.  16.  Lamictal 150 mg 1 tablet p.o. at bedtime.  17.  Levothyroxine 75 mcg 1 tablet p.o. daily.  18.  Lisinopril 20 mg 1 tablet p.o. daily.  19.  Metoprolol tartrate 25 mg 1 tab p.o. b.i.d.  20.  Montelukast 10 mg 1 tablet p.o. at bedtime.  21.  Pantoprazole 40 mg 1 tablet p.o. daily.  22.  Sertraline 100 mg 1 tablet p.o. daily.  23.  Spironolactone 50 mg 1 tablet p.o. daily.   ALLERGIES: AMITRIPTYLINE, AUGMENTIN, BENADRYL, CODEINE, DARVOCET, IODINE, OXYCODONE, VICODIN, WELLBUTRIN, SEAFOOD, LATEX, AND TAPE.   PERTINENT LABORATORY RESULTS AND RADIOGRAPHIC FINDINGS: Serum glucose is 54, BNP 1684, BUN 12, creatinine 1.12, sodium 129, potassium 5.3, chloride is 91, bicarbonate 32, calcium is 8.4, ammonia is 26. Serum albumin is 2.4, alkaline phosphatase 254, AST is 60, ALT is 23. Troponin is negative. White blood cell count is 4.1, hemoglobin 11, hematocrit 34.4, platelet count is 115,000, MCV is 97. INR is 1. There is no imaging.    PHYSICAL EXAMINATION:  VITAL SIGNS: Temperature is 97.6, pulse 80, respirations 16, blood pressure 111/62, pulse oximetry is 99% on 2 L of oxygen via nasal cannula.  GENERAL: The patient is alert and oriented x 3 in no apparent distress.  HEENT: Normocephalic, atraumatic. Pupils equal, round, and reactive light and accommodation. Extraocular movements are intact. Mucous membranes are moist.  NECK: Trachea is midline. No adenopathy. Thyroid is nonpalpable and nontender.  CHEST: Symmetric and atraumatic.  CARDIOVASCULAR: Regular rate and rhythm. Normal S1, S2. No rubs, clicks, or murmurs appreciated.  LUNGS: Clear to auscultation bilaterally. Normal effort and excursion. The patient is wearing oxygen via nasal cannula.  ABDOMEN: Positive bowel sounds. Diffusely tender and quite distended but soft. There is no splenomegaly, but the liver edge is firm. The patient has an ascitic fluid wave present.  GENITOURINARY: Deferred.  MUSCULOSKELETAL: The patient can move all 4 extremities, but I have not tested her gait, as she says transfer from the chair to standing position is very difficult. This has been witnessed by the nurses.  SKIN: There are no rashes, but the patient has ecchymotic lesions all over her arms. The patient has jaundice and bronzing of her skin. EXTREMITIES: No clubbing or cyanosis, but the patient does have 1+ pitting  edema of her lower extremities.  NEUROLOGIC: Cranial nerves II-XII are grossly intact.  PSYCHIATRIC: Mood is normal. Affect is congruent. The patient has excellent insight and judgment into her medical condition.   ASSESSMENT AND PLAN: This is a 59 year old female admitted for weakness and rectal bleeding.  1.  Weakness. Appears to be mostly her proximal muscles. The patient does have difficulty with transitioning from seated to standing position and will definitely need some durable medical equipment for home. I have ordered a physical therapy evaluation for a  wheelchair. This is likely all secondary deconditioning plus weight gain from fluid. The patient's thyroid function is normal at this time home. Health and personal care services already in place, but the patient may need a home safety evaluation for ramp placement or other assistive devices in the home.  2.  Rectal bleeding. There is bright red blood in the toilet in the patient's room. She states that her bleeding is painful, which indicates that she likely has external hemorrhoids. The patient states that she has documented internal hemorrhoids as well as can be expected with portosystemic anastomoses that bleed in the presence of cirrhosis. I have consulted gastroenterology for continuity care. We will continue the patient's topical hemorrhoid cream and await gastroenterology input.  3.  Cirrhosis. Continue Bumex and spironolactone. We may need to increase the dose and the proper ratio for both. Nephrology consult at the discretion of the primary care team.  4.  Hypertension. Continue lisinopril and metoprolol.  5.  Diabetes. We will hold oral medications. I have started sliding scale insulin while the patient is hospitalized.  6.  Hypothyroidism. Continue Synthroid.  7.  Chronic obstructive pulmonary disease. Continue inhalers per her home regimen.  8.  Gait instability. I have ordered physical therapy evaluation for mobility as well.  9.  Hyponatremia. This has been a concern for the patient in the past. It is likely secondary to her cirrhosis and volume overload. The patient's albumin is very low. It will difficult to correct her sodium at this time, but maybe it will improve with some diuresis.  10.  Deep vein thrombosis prophylaxis. Sequential compression devices. We will not anticoagulate the patient at this time due to her current bleeding.  11.  Gastrointestinal prophylaxis. Pantoprazole due to gastritis.   CODE STATUS: The patient is a full code at this time. Her wishes are to be a DO NOT  RESUSCITATE but her son currently healthcare power of attorney and has explicitly stated that he will not honor a DO NOT RESUSCITATE for his mom. I recommend a palliative care consult with the family that could be helpful and reconciling these issues and helping the family come to terms with her prognosis.   THIS TIME SPENT ON ADMISSION ORDERS AND PATIENT CARE: Approximately 45 minutes.     ____________________________ Norva Riffle. Marcille Blanco, MD msd:bm D: 04/11/2014 05:50:52 ET T: 04/11/2014 06:13:33 ET JOB#: [id number]  cc: Norva Riffle. Marcille Blanco, MD, <Dictator> Norva Riffle Kyleen Villatoro MD ELECTRONICALLY SIGNED 04/19/2014 0:44

## 2014-06-24 NOTE — Consult Note (Signed)
Chief Complaint:  Subjective/Chief Complaint minimal bleeding from hemorrhoids today, hydrocortisone cream started.   no nausea, mild abdominal discomfort/fullness.   VITAL SIGNS/ANCILLARY NOTES: **Vital Signs.:   18-Feb-16 15:22  Vital Signs Type Q 8hr  Temperature Temperature (F) 98  Celsius 36.6  Temperature Source oral  Pulse Pulse 84  Respirations Respirations 16  Systolic BP Systolic BP 98  Diastolic BP (mmHg) Diastolic BP (mmHg) 59  Mean BP 72  Pulse Ox % Pulse Ox % 96  Pulse Ox Activity Level  At rest  Oxygen Delivery 2L   Brief Assessment:  Cardiac Regular   Respiratory clear BS   Gastrointestinal details normal Nontender  Bowel sounds normal  obese/possible aascites.   Lab Results: Routine Chem:  18-Feb-16 05:45   Glucose, Serum 78  BUN 14  Creatinine (comp)  1.34  Sodium, Serum  133  Potassium, Serum 4.3  Chloride, Serum  94  CO2, Serum  37  Calcium (Total), Serum  8.2  Anion Gap  2  Osmolality (calc) 266  eGFR (African American)  52  eGFR (Non-African American)  43 (eGFR values <75m/min/1.73 m2 may be an indication of chronic kidney disease (CKD). Calculated eGFR, using the MRDR Study equation, is useful in  patients with stable renal function. The eGFR calculation will not be reliable in acutely ill patients when serum creatinine is changing rapidly. It is not useful in patients on dialysis. The eGFR calculation may not be applicable to patients at the low and high extremes of body sizes, pregnant women, and vegetarians.)  Routine Hem:  18-Feb-16 05:45   WBC (CBC)  2.2  RBC (CBC)  3.23  Hemoglobin (CBC)  10.0  Hematocrit (CBC)  31.4  Platelet Count (CBC)  94  MCV 97  MCH 31.0  MCHC  31.9  RDW  18.9  Neutrophil % 59.9  Lymphocyte % 29.3  Monocyte % 8.8  Eosinophil % 0.9  Basophil % 1.1  Neutrophil #  1.3  Lymphocyte #  0.7  Monocyte # 0.2  Eosinophil # 0.0  Basophil # 0.0 (Result(s) reported on 12 Apr 2014 at 06:14AM.)    Assessment/Plan:  Assessment/Plan:  Assessment 1) hemorrhoidal bleeding-chronic/recurrent.  now on treatment. continue.  if she tolerates the 1% hydrocortisone without unsuusal reaction she described, would change to 2.5% rectal cream. will need 10 days of tid application.  continue low dose citrucel daily (one tablespoon of powder daily)  Appreciated surgical assistance.   Plan will sign off, reconsult if needed.   Electronic Signatures: SLoistine Simas(MD)  (Signed 18-Feb-16 17:56)  Authored: Chief Complaint, VITAL SIGNS/ANCILLARY NOTES, Brief Assessment, Lab Results, Assessment/Plan   Last Updated: 18-Feb-16 17:56 by SLoistine Simas(MD)

## 2014-06-24 NOTE — Discharge Summary (Signed)
PATIENT NAME:  Samantha Mcmillan, WALDVOGEL MR#:  [id number] DATE OF BIRTH:  [birth year]  DATE OF ADMISSION:  04/11/2014 DATE OF DISCHARGE:  04/13/2014  DISCHARGE DIAGNOSES:  1.  Cirrhosis with ascites.  2.  Hemorrhoids with chronic bright red blood per rectum.  3.  Chronic anemia.  4.  Hyponatremia.  5.  Weakness.   DISCHARGE MEDICATIONS:  1.  Lamictal 150 mg once a day.  2.  Montelukast 10 mg once a day.  3.  Atorvastatin 10 mg daily.  4.  Lisinopril 20 mg daily.  5.  Gabapentin 300 mg daily.  6.  Levothyroxine 75 mcg daily.  7.  Dilaudid 2 mg oral every 6 hours as needed.  8.  Buprenorphine 10 mcg transdermal once a week.  9.  Combivent 1 puff inhaled 2 times a day.  10.  Allopurinol 100 mg daily.  11.  Cyclobenzaprine 10 mg every 8 hours. 12.  Sertraline 100 mg daily.  13.  Spironolactone 50 mg daily.  14.  Abilify 5 mg daily.  15.  Symbicort 2 puffs inhaled 2 times a day.  16.  Albuterol nebulizer every 6 hours.  17.  Metoprolol tartrate 25 mg 2 times a day.  18.  Protonix 40 mg daily.  19.  Bumex 1 mg oral 3 times a day.  20.  Hydrocortisone, Trimox and Pramoxine rectal cream 3 times a day for 10 days. 21.  Citrucel 2 grams oral once a day.   DISCHARGE INSTRUCTIONS: 2 liters oxygen continuous, low-sodium diet, daily fluids less than 2 liters, home health with physical therapy and nursing aide set up. Follow-up with PCP in 1 week. The patient was instructed to check weight every day, take an extra fluid pill if weight gain is more than 3 pounds and call her physician.   CONSULTANTS:  Dr. Gustavo Lah and Dr. Jamal Collin.  HISTORY AND PHYSICAL ON ADMISSION:  The patient is a 59 year old Caucasian female who was admitted from ER with weakness and some rectal bleeding. The patient was also found to have some mild hyponatremia along with her ascites and fluid overload.   HOSPITAL COURSE: 1.  Cirrhosis with ascites. The patient was not discharged on diuretics from [facility] at recent  discharge. The patient started retaining more fluid and gained weight and went to the Emergency Room with hyponatremia and fluid overload. The patient was started on Bumex, Aldactone with which she diuresed well, negative 5-1/2 liters during the hospital stay, feels significantly better. Hyponatremia resolved, and was discharged home in a stable condition on diuretics, home health nursing, daily weights and close follow-up with her primary care physician. The patient was seen by GI during the hospital stay during the hospital course.   2.  Bright red blood per rectum. The patient had chronic hemorrhoids for which she followed with Dr. Jamal Collin. She could not afford the rectal cream that she was prescribed. Here she was started on hydrocortisone rectal cream with which her pain and bleeding improved well. Hemoglobin has been stable. She was seen by GI and surgery. No further invasive procedures planned due to high risk from cirrhosis.   Prior to discharge, the patient's lungs sound clear. S1, S2 heard. Abdomen is mildly distended but improved. Bowel sounds present and nontender.   TIME SPENT ON DAY OF DISCHARGE IN DISCHARGE ACTIVITY: 40 minutes.   ____________________________ Leia Alf Kaoru Rezendes, MD srs:mc D: 04/17/2014 11:02:28 ET T: 04/17/2014 13:18:15 ET JOB#: [id number]  cc: Alveta Heimlich R. Ekta Dancer, MD, <Dictator> Neita Carp MD  ELECTRONICALLY SIGNED 04/17/2014 19:13

## 2014-06-24 NOTE — Consult Note (Signed)
PATIENT NAME:  Samantha, Mcmillan MR#:  [id number] DATE OF BIRTH:  1955/09/17  DATE OF CONSULTATION:  04/11/2014  REFERRING PHYSICIAN:   CONSULTING PHYSICIAN:  Lollie Sails, MD  A patient of Dr. Bridgett Larsson.  REASON FOR CONSULTATION: Bleeding hemorrhoid.   HISTORY OF PRESENT ILLNESS: [name] is a 59 year old Caucasian female who was brought to the Emergency Room with weakness. She stated that yesterday, she was trying to walk around in her home. She does use a walker; however, she lost control of her legs due to some weakness and nearly fell. She states that she was at [facility] about a week ago, where she was treated with diuretics for ascites. She did not undergo paracentesis. She was treated with diuretics at that time. A consult today from GI is for recurrent bleeding hemorrhoids. She has seen Dr. Jamal Collin in regards to this issue. She also follows with Dr. Allen Norris in terms of GI. She has a history of an EGD and colonoscopy being done in February 2012 by Dr. Dionne Milo. The colonoscopy was done for a personal history of colon polyps, this showing diverticulosis in the sigmoid colon. She had an EGD on that same date for abdominal pain, that showing normal esophagus, normal stomach, and duodenum normal as well. I had seen her in April 2012 for a complaint of abdominal pain, epigastric. An EGD was done, showing a normal esophagus, normal duodenum, and some erosive gastritis. She states that about 2 years ago, she was diagnosed with cirrhosis. Again, she is followed by Dr. Allen Norris in that regard. She has been evaluated at [facility] for a possible liver transplant and has been told that she is not a good candidate for this due to her COPD.   The patient states that she has had hemorrhoids for years. She states that she has had intermittent rectal bleeding, perhaps 4-5 times a week, usually small volume, seen on the toilet paper, sometimes seen in the toilet. She did see Dr. Jamal Collin in this regard, who felt that she was not a  good surgical candidate due to her comorbid illnesses. She states that she does get some daily nausea, but no vomiting. She takes Protonix daily for reflux, which works well for her. She has no dysphagia. Bowel habits are variable. She sees no black stools or slimy stools. Intermittent rectal bleeding is noted.   LIVER HISTORY: She has a history of cirrhosis, diagnosed about 2 years ago. It is thought to be possibly from either fatty liver or cryptogenic. There is a possibility of medications having affected this as well. She denies any tattoos. She has no history of hepatitis denies, any previous episodes of jaundice or hepatitis. She has not used any IV drugs or intranasal cocaine. 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 She did have a blood transfusion about a year ago. She is followed also by Dr. Inez Pilgrim for iron deficiency. She does have a history of iron deficiency anemia, and does have IV iron administered about every 4-8 weeks. She has no Careers adviser, foreign travel, or incarceration. She has never been a chronic drinker, although she will, every now and then, have some beer. She has never been a heavy drinker. Her father died from pancreatic cancer. She has a cousin that had alcohol-related cirrhosis and possibility of father having alcohol-related issues as well. Again, she has been evaluated for possible liver transplant at [facility]. She  is still followed there by Dr. Patsy Baltimore. She has been hemodynamically stable, and her labs have been stable, as well. She does have a complaint of some rectal discomfort.   PAST MEDICAL HISTORY: She has a history of hypothyroidism, hypertension, type 2 diabetes, COPD, anemia, spinal stenosis, gait instability, and depression.   PAST SURGICAL HISTORY: Includes C-sections, a D and C, infection of the right lower extremity from a dog bite requiring an I and D, as well as a  cervical kyphoplasty.   SOCIAL HISTORY: The patient continues to smoke on a daily basis, about 1/2 pack a day. She does not use alcohol or any illicits.   OUTPATIENT MEDICATIONS: Include the following:  1. Albuterol 1 puff every 6 hours.  2. Allopurinol 100 mg tablet once a day.  3. Aripiprazole 5 mg tablet 1-1/2 daily.  4. Atorvastatin 10 mg once a day.  5. Budesonide/formoterol. 1. 2 puffs twice a day.  6. Bumetanide 1 mg 2 tablets in the morning and 1 in the evening.  7. Buprenorphine 10 micrograms per hour transdermal patch.  8. Combivent 1 puff twice a day.  9. Cyclobenzaprine 10 mg every 8 hours.  10. Dilaudid 2 mg every 6 hours p.r.n. for pain.  11. Gabapentin 300 mg once a day.  12. Glipizide extended release 10 mg 1 tablet twice a day.  13. She has been on some hemorrhoidal ointment that is over-the-counter at home. This, using twice a day.  14. Kombiglyze 1000 mg/2.5 mg tablet 1 twice a day.  15. K-Tab 20 mEq twice a day.  16. Lamictal 150 mg once a day.  17. Levothyroxine 75 mcg daily.  18. Lisinopril 20 mg once a day.  19. Metoprolol tart rate 25 mg twice a day.  20. Montelukast 10 mg 1 tablet once a day.  21. Protonix 40 mg once a day.  22. Sertraline 100 mg once a day.  23. Spironolactone 50 mg once a day.   ALLERGIES: SHE IS ALLERGIC TO AMITRIPTYLINE, AUGMENTIN, BENADRYL, CODEINE, CORTISONE, DARVOCET, "IODINE STRONG," OXYCODONE, AND PERCOCET, AS WELL AS VICODIN, WELLBUTRIN, SEAFOOD, LATEX, AND TAPE.   REVIEW OF SYSTEMS: Ten systems reviewed per admission history and physical, agree with same.   PHYSICAL EXAMINATION: VITAL SIGNS: Temperature is 97.9, pulse 84, respirations 18, blood pressure 117/68, pulse oximetry 94%.  GENERAL: She is a 59 year old Caucasian female, fatigued in appearance, possible mild scleral jaundice.  HEENT: Normocephalic, atraumatic.  EYES: As noted.  NECK: No JVD.  HEART: Regular rate and rhythm.  LUNGS: Clear.  ABDOMEN: Obese. It is  difficult to palpate internal organs. It is difficult to ascertain ascites, although there is likely some amount. There is some evidence of a caput medusae.  EXTREMITIES: About 1+ lower extremity edema.  NEUROLOGICAL: Cranial nerves II through XII grossly intact. Muscle strength bilaterally equal and symmetric. She has to be very carefully transferred to the bed due to mostly general weakness.   LABORATORY DATA: Include the following: On admission to the hospital, she had a glucose of 54, BNP of 1684, BUN 12, creatinine 1.12, sodium 129, potassium 5.3, chloride 91, bicarbonate 32, calcium 8.4, ammonia 26. Of note, there was no asterixis. Total protein 6.3, albumin 2.4, total bilirubin 0.3, direct bilirubin less than 0.1, alkaline phosphatase 254, AST 60, ALT 23. She has had troponin I x 1 less than 0.02. TSH 3.54. Hemogram showing a white count of 4.1, hemoglobin and hematocrit 11.0/34.4, platelet count of 115,000. MCV 97. Urinalysis showing 2+ blood, negative protein, negative  nitrite, negative ketones, negative bilirubin, negative glucose. She had a repeat hemogram today, showing white count of 2.9, hemoglobin and hematocrit 11.1/33.5, platelet count of 108,000.   She has not had any imaging.   ASSESSMENT:  1. 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 Allen Norris, apparently either secondary to fatty liver or cryptogenic.  2. Rectal bleeding. On rectal examination, the patient has a number of large external skin tags and hemorrhoids. One of these is seen to have a small pinpoint, which bleeds a small amount. A very careful digital examination with the tip of the small finger shows internal hemorrhoids as well, quite likely or possibly rectal varices.   RECOMMENDATIONS: 1. Would use local treatment, such as Anusol HC 2.5% cream. I did discuss with the patient that the over-the-counter creams, although less expensive, are much less effective. Would also place her on a single  dose of Citrucel daily, as this will tend to allow stool to be a bit more deformable as it goes through the anal orifice. Likely, this will also allow less straining of stool.  2. I have called and discussed the patient with her surgeon, Dr. Jamal Collin, who states he will be seeing her for consultative purposes while she is in the hospital.    ____________________________ Lollie Sails, MD mus:mw D: 04/11/2014 18:24:50 ET T: 04/11/2014 18:49:08 ET JOB#: [id number]  cc: Lollie Sails, MD, <Dictator> Lollie Sails MD ELECTRONICALLY SIGNED 04/17/2014 14:07

## 2014-06-24 NOTE — Consult Note (Signed)
Brief Consult Note: Diagnosis: bleeding hemorrhoids, cirrhosis.   Patient was seen by consultant.   Consult note dictated.   Recommend further assessment or treatment.   Orders entered.   Comments: Please see full GI consult [phone].  Patietn seen and examined.  On examination, she shows multiple external hemorrhoids, one of which has a small pinhole.dot of blood seeping.  DRE showing also internal hemorrhoids and possible rectal varices.  Recommend local treatment with anusol 2.5% cream tid and a daily dose of citrucel. I have discussed case also with her outpatient surgeon, Dr Jamal Collin, who will also see her in consultation.   Following.  Electronic Signatures: Loistine Simas (MD)  (Signed 17-Feb-16 18:29)  Authored: Brief Consult Note   Last Updated: 17-Feb-16 18:29 by Loistine Simas (MD)

## 2014-06-24 NOTE — Consult Note (Signed)
Chief Complaint:  Subjective/Chief Complaint Further review of the chart finds pateitn relates that when she has used topical cortisone previously she gets short of breath/difficulty breathing.  I am uncertain of the mechanism, however, all the effective hemorrhoidal preps have about 2.5% hydrocortisone.  Paitent has been using anothe  apparently otc cream as o/p but cannot remember the name.  I have asked she have a family member bring it to the hospital so I can see what it is.  Will start the low dose citrucel.   Recheck cbc in am.   Electronic Signatures: Loistine Simas (MD)  (Signed 17-Feb-16 18:47)  Authored: Chief Complaint   Last Updated: 17-Feb-16 18:47 by Loistine Simas (MD)

## 2014-06-28 ENCOUNTER — Ambulatory Visit: Payer: Medicare Other | Admitting: Internal Medicine

## 2014-07-03 ENCOUNTER — Inpatient Hospital Stay: Payer: Medicare Other | Attending: Internal Medicine

## 2014-07-03 ENCOUNTER — Other Ambulatory Visit: Payer: Self-pay | Admitting: *Deleted

## 2014-07-03 ENCOUNTER — Other Ambulatory Visit: Payer: Self-pay | Admitting: Internal Medicine

## 2014-07-03 DIAGNOSIS — D509 Iron deficiency anemia, unspecified: Secondary | ICD-10-CM | POA: Insufficient documentation

## 2014-07-03 DIAGNOSIS — D696 Thrombocytopenia, unspecified: Secondary | ICD-10-CM | POA: Insufficient documentation

## 2014-07-03 DIAGNOSIS — K5900 Constipation, unspecified: Secondary | ICD-10-CM

## 2014-07-03 LAB — HEMOGLOBIN: Hemoglobin: 7.8 g/dL — ABNORMAL LOW (ref 12.0–16.0)

## 2014-07-06 ENCOUNTER — Other Ambulatory Visit: Payer: Self-pay | Admitting: *Deleted

## 2014-07-06 ENCOUNTER — Emergency Department: Payer: Medicare Other

## 2014-07-06 ENCOUNTER — Inpatient Hospital Stay
Admission: EM | Admit: 2014-07-06 | Discharge: 2014-07-09 | DRG: 603 | Disposition: A | Discharge status: Home / Self Care | Payer: Medicare Other | Attending: Internal Medicine | Admitting: Internal Medicine

## 2014-07-06 DIAGNOSIS — M545 Low back pain: Secondary | ICD-10-CM | POA: Diagnosis present

## 2014-07-06 DIAGNOSIS — L03115 Cellulitis of right lower limb: Secondary | ICD-10-CM | POA: Diagnosis present

## 2014-07-06 DIAGNOSIS — I10 Essential (primary) hypertension: Secondary | ICD-10-CM | POA: Diagnosis present

## 2014-07-06 DIAGNOSIS — Z9981 Dependence on supplemental oxygen: Secondary | ICD-10-CM

## 2014-07-06 DIAGNOSIS — Z9889 Other specified postprocedural states: Secondary | ICD-10-CM

## 2014-07-06 DIAGNOSIS — Z9851 Tubal ligation status: Secondary | ICD-10-CM

## 2014-07-06 DIAGNOSIS — E871 Hypo-osmolality and hyponatremia: Secondary | ICD-10-CM | POA: Diagnosis present

## 2014-07-06 DIAGNOSIS — Z808 Family history of malignant neoplasm of other organs or systems: Secondary | ICD-10-CM

## 2014-07-06 DIAGNOSIS — R011 Cardiac murmur, unspecified: Secondary | ICD-10-CM | POA: Diagnosis present

## 2014-07-06 DIAGNOSIS — L03119 Cellulitis of unspecified part of limb: Secondary | ICD-10-CM | POA: Diagnosis present

## 2014-07-06 DIAGNOSIS — D638 Anemia in other chronic diseases classified elsewhere: Secondary | ICD-10-CM | POA: Diagnosis present

## 2014-07-06 DIAGNOSIS — E119 Type 2 diabetes mellitus without complications: Secondary | ICD-10-CM

## 2014-07-06 DIAGNOSIS — Z86010 Personal history of colonic polyps: Secondary | ICD-10-CM

## 2014-07-06 DIAGNOSIS — K589 Irritable bowel syndrome without diarrhea: Secondary | ICD-10-CM | POA: Diagnosis present

## 2014-07-06 DIAGNOSIS — F329 Major depressive disorder, single episode, unspecified: Secondary | ICD-10-CM | POA: Diagnosis present

## 2014-07-06 DIAGNOSIS — E780 Pure hypercholesterolemia: Secondary | ICD-10-CM | POA: Diagnosis present

## 2014-07-06 DIAGNOSIS — F4310 Post-traumatic stress disorder, unspecified: Secondary | ICD-10-CM | POA: Diagnosis present

## 2014-07-06 DIAGNOSIS — F42 Obsessive-compulsive disorder: Secondary | ICD-10-CM | POA: Diagnosis present

## 2014-07-06 DIAGNOSIS — K7460 Unspecified cirrhosis of liver: Secondary | ICD-10-CM | POA: Diagnosis present

## 2014-07-06 DIAGNOSIS — Z885 Allergy status to narcotic agent status: Secondary | ICD-10-CM

## 2014-07-06 DIAGNOSIS — J441 Chronic obstructive pulmonary disease with (acute) exacerbation: Secondary | ICD-10-CM | POA: Diagnosis present

## 2014-07-06 DIAGNOSIS — Z91040 Latex allergy status: Secondary | ICD-10-CM

## 2014-07-06 DIAGNOSIS — J45909 Unspecified asthma, uncomplicated: Secondary | ICD-10-CM | POA: Diagnosis present

## 2014-07-06 DIAGNOSIS — K219 Gastro-esophageal reflux disease without esophagitis: Secondary | ICD-10-CM | POA: Diagnosis present

## 2014-07-06 DIAGNOSIS — J9610 Chronic respiratory failure, unspecified whether with hypoxia or hypercapnia: Secondary | ICD-10-CM | POA: Diagnosis present

## 2014-07-06 DIAGNOSIS — Z79899 Other long term (current) drug therapy: Secondary | ICD-10-CM

## 2014-07-06 DIAGNOSIS — M13879 Other specified arthritis, unspecified ankle and foot: Secondary | ICD-10-CM | POA: Diagnosis present

## 2014-07-06 DIAGNOSIS — L02419 Cutaneous abscess of limb, unspecified: Secondary | ICD-10-CM | POA: Diagnosis present

## 2014-07-06 DIAGNOSIS — F419 Anxiety disorder, unspecified: Secondary | ICD-10-CM | POA: Diagnosis present

## 2014-07-06 DIAGNOSIS — Z452 Encounter for adjustment and management of vascular access device: Secondary | ICD-10-CM

## 2014-07-06 DIAGNOSIS — I878 Other specified disorders of veins: Secondary | ICD-10-CM | POA: Diagnosis present

## 2014-07-06 DIAGNOSIS — Z888 Allergy status to other drugs, medicaments and biological substances status: Secondary | ICD-10-CM

## 2014-07-06 DIAGNOSIS — J449 Chronic obstructive pulmonary disease, unspecified: Secondary | ICD-10-CM | POA: Diagnosis present

## 2014-07-06 DIAGNOSIS — D509 Iron deficiency anemia, unspecified: Secondary | ICD-10-CM

## 2014-07-06 DIAGNOSIS — L03116 Cellulitis of left lower limb: Secondary | ICD-10-CM | POA: Diagnosis not present

## 2014-07-06 DIAGNOSIS — F17210 Nicotine dependence, cigarettes, uncomplicated: Secondary | ICD-10-CM | POA: Diagnosis present

## 2014-07-06 DIAGNOSIS — G8929 Other chronic pain: Secondary | ICD-10-CM | POA: Diagnosis present

## 2014-07-06 DIAGNOSIS — G4730 Sleep apnea, unspecified: Secondary | ICD-10-CM | POA: Diagnosis present

## 2014-07-06 LAB — URINALYSIS COMPLETE WITH MICROSCOPIC (ARMC ONLY)
Bacteria, UA: NONE SEEN
Bilirubin Urine: NEGATIVE
Glucose, UA: NEGATIVE mg/dL
Hgb urine dipstick: NEGATIVE
KETONES UR: NEGATIVE mg/dL
Nitrite: NEGATIVE
Protein, ur: 100 mg/dL — AB
SPECIFIC GRAVITY, URINE: 1.01 (ref 1.005–1.030)
pH: 9 — ABNORMAL HIGH (ref 5.0–8.0)

## 2014-07-06 LAB — COMPREHENSIVE METABOLIC PANEL
ALK PHOS: 188 U/L — AB (ref 38–126)
ALT: 17 U/L (ref 14–54)
ANION GAP: 8 (ref 5–15)
AST: 43 U/L — ABNORMAL HIGH (ref 15–41)
Albumin: 3.1 g/dL — ABNORMAL LOW (ref 3.5–5.0)
BUN: 10 mg/dL (ref 6–20)
CO2: 28 mmol/L (ref 22–32)
CREATININE: 0.71 mg/dL (ref 0.44–1.00)
Calcium: 8.2 mg/dL — ABNORMAL LOW (ref 8.9–10.3)
Chloride: 87 mmol/L — ABNORMAL LOW (ref 101–111)
Glucose, Bld: 142 mg/dL — ABNORMAL HIGH (ref 65–99)
Potassium: 4.7 mmol/L (ref 3.5–5.1)
Sodium: 123 mmol/L — ABNORMAL LOW (ref 135–145)
Total Bilirubin: 0.8 mg/dL (ref 0.3–1.2)
Total Protein: 6.2 g/dL — ABNORMAL LOW (ref 6.5–8.1)

## 2014-07-06 LAB — CBC WITH DIFFERENTIAL/PLATELET
BASOS ABS: 0 10*3/uL (ref 0–0.1)
Basophils Relative: 1 %
EOS ABS: 0 10*3/uL (ref 0–0.7)
Eosinophils Relative: 0 %
HEMATOCRIT: 29.5 % — AB (ref 35.0–47.0)
HEMOGLOBIN: 9.6 g/dL — AB (ref 12.0–16.0)
LYMPHS PCT: 4 %
Lymphs Abs: 0.3 10*3/uL — ABNORMAL LOW (ref 1.0–3.6)
MCH: 29.4 pg (ref 26.0–34.0)
MCHC: 32.6 g/dL (ref 32.0–36.0)
MCV: 90.2 fL (ref 80.0–100.0)
Monocytes Absolute: 0.3 10*3/uL (ref 0.2–0.9)
Monocytes Relative: 3 %
NEUTROS ABS: 7.1 10*3/uL — AB (ref 1.4–6.5)
Neutrophils Relative %: 92 %
PLATELETS: 122 10*3/uL — AB (ref 150–440)
RBC: 3.27 MIL/uL — ABNORMAL LOW (ref 3.80–5.20)
RDW: 19.3 % — ABNORMAL HIGH (ref 11.5–14.5)
WBC: 7.7 10*3/uL (ref 3.6–11.0)

## 2014-07-06 LAB — LACTIC ACID, PLASMA: Lactic Acid, Venous: 1.9 mmol/L (ref 0.5–2.0)

## 2014-07-06 MED ORDER — SODIUM CHLORIDE 0.9 % IV BOLUS (SEPSIS)
1000.0000 mL | Freq: Once | INTRAVENOUS | Status: AC
  Administered 2014-07-06: 1000 mL via INTRAVENOUS

## 2014-07-06 MED ORDER — PIPERACILLIN-TAZOBACTAM 3.375 G IVPB
INTRAVENOUS | Status: AC
  Administered 2014-07-06: 3.375 g via INTRAVENOUS
  Filled 2014-07-06: qty 50

## 2014-07-06 MED ORDER — IBUPROFEN 600 MG PO TABS
ORAL_TABLET | ORAL | Status: AC
  Administered 2014-07-06: 600 mg via ORAL
  Filled 2014-07-06: qty 1

## 2014-07-06 MED ORDER — VANCOMYCIN HCL IN DEXTROSE 1-5 GM/200ML-% IV SOLN
1000.0000 mg | Freq: Once | INTRAVENOUS | Status: AC
  Administered 2014-07-07: 1000 mg via INTRAVENOUS

## 2014-07-06 MED ORDER — PIPERACILLIN-TAZOBACTAM 3.375 G IVPB 30 MIN
3.3750 g | Freq: Once | INTRAVENOUS | Status: AC
  Administered 2014-07-06: 3.375 g via INTRAVENOUS

## 2014-07-06 MED ORDER — IBUPROFEN 600 MG PO TABS
600.0000 mg | ORAL_TABLET | Freq: Once | ORAL | Status: AC
  Administered 2014-07-06: 600 mg via ORAL

## 2014-07-06 NOTE — ED Notes (Signed)
Pt sent from home by home care nurse for "cellulitis" to left lower extremity. Wound X 1 month.

## 2014-07-06 NOTE — ED Notes (Signed)
Pt in room with significant other at bedside. Questions answered. Will continue to monitor.

## 2014-07-06 NOTE — ED Notes (Signed)
Pt with ultrasound.

## 2014-07-06 NOTE — ED Provider Notes (Addendum)
[facility] Emergency Department Provider Note   ____________________________________________  Time seen: 7:35 PM I have reviewed the triage vital signs and the triage nursing note.  HISTORY  Chief Complaint Cellulitis    HPI Samantha Mcmillan is a 59 y.o. female who is here for chills for 2 days subjective fever for one day and for evaluation off of a recommendation from her home health nurse. She has had a cough but is nonproductive.She's had some nausea but no vomiting or diarrhea. She's had increased lower sure he swelling for a few days. She has wounds on her anterior tibias bilaterally which are being followed by a home health nurse and her left lower extremity has had increased redness 1 day. Her home health nurse is concerned for cellulitis.    Past Medical History  Diagnosis Date  . GERD (gastroesophageal reflux disease)   . Colon polyp   . Cirrhosis of liver   . Hypertension   . Depression   . COPD (chronic obstructive pulmonary disease)   . IBS (irritable bowel syndrome)   . On home oxygen therapy     "2L; 24/7" (05/04/2014)  . Family history of adverse reaction to anesthesia     "my sister doesn't wake up good when she's put deep to sleep"  . High cholesterol   . Heart murmur   . Asthma   . Pneumonia "several times"  . Chronic bronchitis     "get it pretty much q yr"   . Sleep apnea     "can't tolerate CPAP" (05/04/2014)  . Type II diabetes mellitus   . History of blood transfusion     "related to my anemia"  . Anemia   . Iron deficiency anemia   . Headache     "more than a couple times/wk" (05/04/2014)  . Arthritis     "some in my feet" (05/04/2014)  . Chronic lower back pain   . Anxiety   . Acute on chronic diastolic CHF (congestive heart failure) 05/16/2014  . OCD (obsessive compulsive disorder)   . Panic attack   . PTSD (post-traumatic stress disorder)   . Chronic respiratory failure   . Cirrhosis of liver     Patient  Active Problem List   Diagnosis Date Noted  . Metabolic & toxic encephalopathy, multifactorial 05/18/2014  . Encephalopathy, hepatic 05/18/2014  . Polypharmacy 05/18/2014  . Failure to thrive in adult 05/16/2014  . Other pancytopenia 05/16/2014  . Hepatic cirrhosis 05/16/2014  . Protein-calorie malnutrition, severe 05/16/2014  . Advanced COPD 05/16/2014  . Acute on chronic diastolic CHF (congestive heart failure) 05/16/2014  . Chronic back pain 05/16/2014  . Weakness 05/15/2014  . Hyponatremia 05/15/2014  . HCAP (healthcare-associated pneumonia) 05/09/2014  . Urinary retention 05/09/2014  . DM2 (diabetes mellitus, type 2) 05/09/2014  . AKI (acute kidney injury) 05/05/2014  . Hypothyroidism 05/05/2014    Past Surgical History  Procedure Laterality Date  . Cesarean section  1979  . Cataract extraction w/ intraocular lens  implant, bilateral Bilateral 2005  . Back surgery    . Colonoscopy  2014  . Upper gastrointestinal endoscopy    . Posterior fusion cervical spine  2015    "rebuilt 3 of my neck vertebrae"  . Incision and drainage of wound Right 2009    "leg mauled  by dog"  . Tubal ligation  1979  . Dilation and curettage of uterus      Current Outpatient Rx  Name  Route  Sig  Dispense  Refill  . allopurinol (ZYLOPRIM) 100 MG tablet   Oral   Take 100 mg by mouth 2 (two) times daily.          . Amino Acids-Protein Hydrolys (FEEDING SUPPLEMENT, PRO-STAT SUGAR FREE 64,) LIQD   Oral   Take 30 mLs by mouth daily.          . ARIPiprazole (ABILIFY) 5 MG tablet   Oral   Take 5 mg by mouth daily.          Marland Kitchen atorvastatin (LIPITOR) 10 MG tablet   Oral   Take 10 mg by mouth daily at 6 PM.          . bumetanide (BUMEX) 1 MG tablet      1 tablet daily or as directed. Patient taking differently: Take 1 mg by mouth daily. 1 tablet daily or as directed.   30 tablet   0   . buprenorphine (BUTRANS - DOSED MCG/HR) 10 MCG/HR PTWK patch   Transdermal   Place 1 patch  (10 mcg total) onto the skin once a week.   4 patch   0   . clonazePAM (KLONOPIN) 0.5 MG tablet   Oral   Take 1 tablet (0.5 mg total) by mouth daily. Patient taking differently: Take 0.5 mg by mouth at bedtime.    60 tablet   0   . cyclobenzaprine (FLEXERIL) 10 MG tablet   Oral   Take 10 mg by mouth 3 (three) times daily as needed for muscle spasms.         . feeding supplement, GLUCERNA SHAKE, (GLUCERNA SHAKE) LIQD   Oral   Take 237 mLs by mouth 3 (three) times daily between meals.      0   . folic acid (FOLVITE) 1 MG tablet   Oral   Take 1 tablet (1 mg total) by mouth daily.         Marland Kitchen gabapentin (NEURONTIN) 300 MG capsule   Oral   Take 300 mg by mouth at bedtime.         Marland Kitchen HYDROmorphone (DILAUDID) 2 MG tablet   Oral   Take 2 mg by mouth every 6 (six) hours as needed for moderate pain or severe pain.         Marland Kitchen lactulose (CHRONULAC) 10 GM/15ML solution      TAKE 30 ML BY MOUTH 2 TIMES DAILY   480 mL   0   . lactulose, encephalopathy, (CHRONULAC) 10 GM/15ML SOLN   Oral   Take 20 g by mouth 2 (two) times daily.          Marland Kitchen lamoTRIgine (LAMICTAL) 150 MG tablet   Oral   Take 150 mg by mouth at bedtime.          Marland Kitchen levothyroxine (SYNTHROID, LEVOTHROID) 75 MCG tablet   Oral   Take 75 mcg by mouth daily.          Marland Kitchen lisinopril (PRINIVIL,ZESTRIL) 20 MG tablet   Oral   Take 20 mg by mouth daily.         . magnesium oxide (MAG-OX) 400 MG tablet   Oral   Take 400 mg by mouth 2 (two) times daily.         . metoCLOPramide (REGLAN) 5 MG tablet   Oral   Take 5 mg by mouth 4 (four) times daily -  before meals and at bedtime.          . metoprolol succinate (TOPROL-XL) 25 MG 24  hr tablet   Oral   Take 25 mg by mouth 2 (two) times daily.         . montelukast (SINGULAIR) 10 MG tablet   Oral   Take 10 mg by mouth at bedtime.         . Multiple Vitamin (MULTIVITAMIN WITH MINERALS) TABS tablet   Oral   Take 1 tablet by mouth daily.          . pantoprazole (PROTONIX) 40 MG tablet   Oral   Take 40 mg by mouth daily.         . polyethylene glycol (MIRALAX / GLYCOLAX) packet   Oral   Take 17 g by mouth daily as needed for mild constipation.         . potassium chloride (K-DUR) 10 MEQ tablet   Oral   Take 10 mEq by mouth at bedtime.         . Saxagliptin-Metformin 2.06-998 MG TB24   Oral   Take 1 tablet by mouth 2 (two) times daily.         . sennosides-docusate sodium (SENOKOT-S) 8.6-50 MG tablet   Oral   Take 2 tablets by mouth at bedtime.          . sertraline (ZOLOFT) 100 MG tablet   Oral   Take 100 mg by mouth at bedtime.          Marland Kitchen spironolactone (ALDACTONE) 25 MG tablet   Oral   Take 1 tablet (25 mg total) by mouth daily.   30 tablet   0   . thiamine 100 MG tablet   Oral   Take 1 tablet (100 mg total) by mouth daily.         . Vitamin D, Ergocalciferol, (DRISDOL) 50000 UNITS CAPS capsule   Oral   Take 50,000 Units by mouth every 7 (seven) days. Saturday         . acetaminophen (TYLENOL) 500 MG tablet   Oral   Take 1,000 mg by mouth every 6 (six) hours as needed for moderate pain or headache.         . albuterol (PROVENTIL HFA;VENTOLIN HFA) 108 (90 BASE) MCG/ACT inhaler   Inhalation   Inhale 2 puffs into the lungs every 4 (four) hours as needed for wheezing or shortness of breath.         . budesonide-formoterol (SYMBICORT) 80-4.5 MCG/ACT inhaler   Inhalation   Inhale 2 puffs into the lungs 2 (two) times daily as needed (shortness of breath).          Marland Kitchen guaiFENesin (MUCINEX) 600 MG 12 hr tablet   Oral   Take 600 mg by mouth 2 (two) times daily as needed for cough.          Marland Kitchen ipratropium-albuterol (DUONEB) 0.5-2.5 (3) MG/3ML SOLN   Nebulization   Take 3 mLs by nebulization every 4 (four) hours as needed. And Q6H PRN         . ondansetron (ZOFRAN) 4 MG tablet   Oral   Take 4 mg by mouth 2 (two) times daily as needed for nausea or vomiting.         . Pramoxine-HC  (HYDROCORTISONE ACE-PRAMOXINE) 2.5-1 % CREA   Apply externally   Apply 1 application topically 2 (two) times daily. Patient taking differently: Apply 1 application topically 2 (two) times daily as needed.    1 Tube   0     Allergies Iodine; Latex; Oxycodone; Percocet; Shellfish allergy; Amitriptyline;  Fish allergy; Tape; Wellbutrin; Augmentin; Codeine; Cortisone; Diphenhydramine; Other; and Vicodin  Family History  Problem Relation Age of Onset  . Cancer Father     pancreatic    Social History History  Substance Use Topics  . Smoking status: Current Every Day Smoker -- 0.50 packs/day for 48 years    Types: Cigarettes  . Smokeless tobacco: Never Used  . Alcohol Use: No    Review of Systems  Constitutional: Positive for generalized weakness Eyes: Negative for visual changes. ENT: Negative for sore throat. Cardiovascular: Negative for chest pain. Respiratory: Shortness of breath and 2.5 L home O2 no change from baseline Gastrointestinal: Negative for abdominal pain, vomiting and diarrhea. Genitourinary: Negative for dysuria. Musculoskeletal: Negative for back pain. Skin: Negative for rash. Neurological: Negative for headaches, focal weakness or numbness.  ____________________________________________   PHYSICAL EXAM:  VITAL SIGNS: ED Triage Vitals  Enc Vitals Group     BP 07/06/14 1802 106/64 mmHg     Pulse Rate 07/06/14 1802 105     Resp 07/06/14 1802 20     Temp 07/06/14 1802 99.8 F (37.7 C)     Temp Source 07/06/14 1802 Oral     SpO2 07/06/14 1802 93 %     Weight 07/06/14 1802 176 lb (79.833 kg)     Height 07/06/14 1802 5\' 4"  (1.626 m)     Head Cir --      Peak Flow --      Pain Score 07/06/14 1803 2     Pain Loc --      Pain Edu? --      Excl. in [place]? --      Constitutional: Alert and oriented. In no acute distress but patient is generally weak all over. Eyes: Conjunctivae are normal. PERRL. Normal extraocular movements. ENT   Head:  Normocephalic and atraumatic.   Nose: No congestion/rhinnorhea.   Mouth/Throat: Mucous membranes are moderately dry.   Neck: No stridor. Cardiovascular: Normal rate, regular rhythm.  No murmurs, rubs, or gallops. Respiratory: Normal respiratory effort without tachypnea nor retractions. Breath sounds are clear and equal bilaterally. No wheezes/rales/rhonchi. Gastrointestinal: Soft and nontender. No distention. Obese Genitourinary: Musculoskeletal: Nontender with normal range of motion in all extremities. No joint effusions. Right lower extremity 2+ pitting edema with clean dry and intact anterior tibial chronic wound. Left lower extremity with 3+ P edema and an area of redness/cellulitis around a chronic anterior tibial wound Neurologic:  Normal speech and language. No gross focal neurologic deficits are appreciated. Speech is normal.  Skin:  Skin is warm, dry and intact. Patient is jaundice. Psychiatric: Mood and affect are normal. Speech and behavior are normal. Patient exhibits appropriate insight and judgment.  ____________________________________________   EKG  97 bpm. Normal sinus rhythm. Normal QRS. Normal axis. Normal ST and T-wave ____________________________________________  LABS (pertinent positives/negatives) Sodium 123 White blood count is 7.7 and hemoglobin is 9.6 and platelets 122 Lactate 1.9 AST 43 and alkaline phosphatase 188   ____________________________________________  RADIOLOGY Radiologist results reviewed  Chest x-ray: Negative Ultrasound left and right lower extremity: No DVT __________________________________________  PROCEDURES  Procedure(s) performed:  Critical Care performed:   ____________________________________________   ED COURSE / ASSESSMENT AND PLAN  Pertinent labs & imaging results that were available during my care of the patient were reviewed by me and considered in my medical decision making (see chart for  details).   Patient is here and looks very weak as well as jaundice. She has had no fever but has had  chills. Her blood pressures low and she was given normal saline. She does have chronic hyponatremia but today is worse at 123. She's had a cough for chest x-ray is clear showing no pneumonia. Her left lower extremity is very red consistent with a cellulitis around a chronic leg wound. Recheck of her temperature rectally repeat field a temp to her of 103.  She was started on antibiotics vancomycin and Zosyn for cellulitis.[facility] for admission. ___________________________________________   FINAL CLINICAL IMPRESSION(S) / ED DIAGNOSES   Acute cellulitis left lower extremity Acute on chronic hyponatremia   Lisa Roca, MD 07/06/14 2325  Lisa Roca, MD 07/06/14 2326

## 2014-07-07 ENCOUNTER — Inpatient Hospital Stay: Payer: Medicare Other

## 2014-07-07 DIAGNOSIS — R011 Cardiac murmur, unspecified: Secondary | ICD-10-CM | POA: Diagnosis present

## 2014-07-07 DIAGNOSIS — M13879 Other specified arthritis, unspecified ankle and foot: Secondary | ICD-10-CM | POA: Diagnosis present

## 2014-07-07 DIAGNOSIS — F42 Obsessive-compulsive disorder: Secondary | ICD-10-CM | POA: Diagnosis present

## 2014-07-07 DIAGNOSIS — Z9851 Tubal ligation status: Secondary | ICD-10-CM | POA: Diagnosis not present

## 2014-07-07 DIAGNOSIS — D638 Anemia in other chronic diseases classified elsewhere: Secondary | ICD-10-CM | POA: Diagnosis present

## 2014-07-07 DIAGNOSIS — L02419 Cutaneous abscess of limb, unspecified: Secondary | ICD-10-CM | POA: Diagnosis present

## 2014-07-07 DIAGNOSIS — Z9981 Dependence on supplemental oxygen: Secondary | ICD-10-CM | POA: Diagnosis not present

## 2014-07-07 DIAGNOSIS — Z91040 Latex allergy status: Secondary | ICD-10-CM | POA: Diagnosis not present

## 2014-07-07 DIAGNOSIS — Z86010 Personal history of colonic polyps: Secondary | ICD-10-CM | POA: Diagnosis not present

## 2014-07-07 DIAGNOSIS — M545 Low back pain: Secondary | ICD-10-CM | POA: Diagnosis present

## 2014-07-07 DIAGNOSIS — G4730 Sleep apnea, unspecified: Secondary | ICD-10-CM | POA: Diagnosis present

## 2014-07-07 DIAGNOSIS — J45909 Unspecified asthma, uncomplicated: Secondary | ICD-10-CM | POA: Diagnosis present

## 2014-07-07 DIAGNOSIS — L03115 Cellulitis of right lower limb: Secondary | ICD-10-CM | POA: Diagnosis present

## 2014-07-07 DIAGNOSIS — Z808 Family history of malignant neoplasm of other organs or systems: Secondary | ICD-10-CM | POA: Diagnosis not present

## 2014-07-07 DIAGNOSIS — E871 Hypo-osmolality and hyponatremia: Secondary | ICD-10-CM | POA: Diagnosis present

## 2014-07-07 DIAGNOSIS — K7460 Unspecified cirrhosis of liver: Secondary | ICD-10-CM | POA: Diagnosis present

## 2014-07-07 DIAGNOSIS — F4310 Post-traumatic stress disorder, unspecified: Secondary | ICD-10-CM | POA: Diagnosis present

## 2014-07-07 DIAGNOSIS — L03119 Cellulitis of unspecified part of limb: Secondary | ICD-10-CM

## 2014-07-07 DIAGNOSIS — J449 Chronic obstructive pulmonary disease, unspecified: Secondary | ICD-10-CM | POA: Diagnosis present

## 2014-07-07 DIAGNOSIS — K219 Gastro-esophageal reflux disease without esophagitis: Secondary | ICD-10-CM | POA: Diagnosis present

## 2014-07-07 DIAGNOSIS — J9610 Chronic respiratory failure, unspecified whether with hypoxia or hypercapnia: Secondary | ICD-10-CM | POA: Diagnosis present

## 2014-07-07 DIAGNOSIS — L03116 Cellulitis of left lower limb: Secondary | ICD-10-CM

## 2014-07-07 DIAGNOSIS — I10 Essential (primary) hypertension: Secondary | ICD-10-CM | POA: Diagnosis present

## 2014-07-07 DIAGNOSIS — Z9889 Other specified postprocedural states: Secondary | ICD-10-CM | POA: Diagnosis not present

## 2014-07-07 DIAGNOSIS — F329 Major depressive disorder, single episode, unspecified: Secondary | ICD-10-CM | POA: Diagnosis present

## 2014-07-07 DIAGNOSIS — E119 Type 2 diabetes mellitus without complications: Secondary | ICD-10-CM | POA: Diagnosis present

## 2014-07-07 DIAGNOSIS — Z79899 Other long term (current) drug therapy: Secondary | ICD-10-CM | POA: Diagnosis not present

## 2014-07-07 DIAGNOSIS — G8929 Other chronic pain: Secondary | ICD-10-CM | POA: Diagnosis present

## 2014-07-07 DIAGNOSIS — Z885 Allergy status to narcotic agent status: Secondary | ICD-10-CM | POA: Diagnosis not present

## 2014-07-07 DIAGNOSIS — Z888 Allergy status to other drugs, medicaments and biological substances status: Secondary | ICD-10-CM | POA: Diagnosis not present

## 2014-07-07 DIAGNOSIS — I878 Other specified disorders of veins: Secondary | ICD-10-CM | POA: Diagnosis present

## 2014-07-07 DIAGNOSIS — D509 Iron deficiency anemia, unspecified: Secondary | ICD-10-CM | POA: Diagnosis present

## 2014-07-07 DIAGNOSIS — K589 Irritable bowel syndrome without diarrhea: Secondary | ICD-10-CM | POA: Diagnosis present

## 2014-07-07 DIAGNOSIS — F419 Anxiety disorder, unspecified: Secondary | ICD-10-CM | POA: Diagnosis present

## 2014-07-07 DIAGNOSIS — E780 Pure hypercholesterolemia: Secondary | ICD-10-CM | POA: Diagnosis present

## 2014-07-07 DIAGNOSIS — F17210 Nicotine dependence, cigarettes, uncomplicated: Secondary | ICD-10-CM | POA: Diagnosis present

## 2014-07-07 LAB — BASIC METABOLIC PANEL
ANION GAP: 5 (ref 5–15)
BUN: 9 mg/dL (ref 6–20)
CO2: 26 mmol/L (ref 22–32)
Calcium: 7.6 mg/dL — ABNORMAL LOW (ref 8.9–10.3)
Chloride: 93 mmol/L — ABNORMAL LOW (ref 101–111)
Creatinine, Ser: 0.74 mg/dL (ref 0.44–1.00)
GFR calc Af Amer: >60 mL/min (ref >60)
GFR calc non Af Amer: >60 mL/min (ref >60)
GLUCOSE: 156 mg/dL — AB (ref 65–99)
Potassium: 4 mmol/L (ref 3.5–5.1)
Sodium: 124 mmol/L — ABNORMAL LOW (ref 135–145)

## 2014-07-07 LAB — CBC
HCT: 24 % — ABNORMAL LOW (ref 35.0–47.0)
HEMATOCRIT: 23.4 % — AB (ref 35.0–47.0)
HEMOGLOBIN: 7.7 g/dL — AB (ref 12.0–16.0)
HEMOGLOBIN: 7.4 g/dL — AB (ref 12.0–16.0)
MCH: 28.8 pg (ref 26.0–34.0)
MCH: 28.6 pg (ref 26.0–34.0)
MCHC: 31.9 g/dL — ABNORMAL LOW (ref 32.0–36.0)
MCHC: 31.6 g/dL — ABNORMAL LOW (ref 32.0–36.0)
MCV: 90.1 fL (ref 80.0–100.0)
MCV: 90.6 fL (ref 80.0–100.0)
PLATELETS: 86 10*3/uL — AB (ref 150–440)
Platelets: 87 10*3/uL — ABNORMAL LOW (ref 150–440)
RBC: 2.66 MIL/uL — ABNORMAL LOW (ref 3.80–5.20)
RBC: 2.58 MIL/uL — ABNORMAL LOW (ref 3.80–5.20)
RDW: 18.5 % — AB (ref 11.5–14.5)
RDW: 18.6 % — ABNORMAL HIGH (ref 11.5–14.5)
WBC: 6.1 10*3/uL (ref 3.6–11.0)
WBC: 5.2 10*3/uL (ref 3.6–11.0)

## 2014-07-07 LAB — GLUCOSE, CAPILLARY
GLUCOSE-CAPILLARY: 134 mg/dL — AB (ref 65–99)
GLUCOSE-CAPILLARY: 123 mg/dL — AB (ref 65–99)
GLUCOSE-CAPILLARY: 110 mg/dL — AB (ref 65–99)
Glucose-Capillary: 121 mg/dL — ABNORMAL HIGH (ref 65–99)

## 2014-07-07 LAB — LACTIC ACID, PLASMA: Lactic Acid, Venous: 1.4 mmol/L (ref 0.5–2.0)

## 2014-07-07 MED ORDER — MAGNESIUM OXIDE 400 MG PO TABS
400.0000 mg | ORAL_TABLET | Freq: Two times a day (BID) | ORAL | Status: DC
  Administered 2014-07-07 – 2014-07-09 (×5): 400 mg via ORAL
  Filled 2014-07-07 (×11): qty 1

## 2014-07-07 MED ORDER — ALBUTEROL SULFATE HFA 108 (90 BASE) MCG/ACT IN AERS: 2.0000 | INHALATION_SPRAY | RESPIRATORY_TRACT | Status: DC | PRN

## 2014-07-07 MED ORDER — HYDROMORPHONE HCL 2 MG PO TABS: 2.0000 mg | ORAL_TABLET | Freq: Four times a day (QID) | ORAL | Status: DC | PRN

## 2014-07-07 MED ORDER — IPRATROPIUM-ALBUTEROL 0.5-2.5 (3) MG/3ML IN SOLN: 3.0000 mL | RESPIRATORY_TRACT | Status: DC | PRN

## 2014-07-07 MED ORDER — MONTELUKAST SODIUM 10 MG PO TABS
10.0000 mg | ORAL_TABLET | Freq: Every day | ORAL | Status: DC
  Administered 2014-07-07 – 2014-07-08 (×3): 10 mg via ORAL
  Filled 2014-07-07 (×3): qty 1

## 2014-07-07 MED ORDER — LEVOTHYROXINE SODIUM 75 MCG PO TABS
75.0000 ug | ORAL_TABLET | Freq: Every day | ORAL | Status: DC
Start: 2014-07-07 — End: 2014-07-09
  Administered 2014-07-07 – 2014-07-09 (×3): 75 ug via ORAL
  Filled 2014-07-07 (×3): qty 1

## 2014-07-07 MED ORDER — FOLIC ACID 1 MG PO TABS
1.0000 mg | ORAL_TABLET | Freq: Every day | ORAL | Status: DC
  Administered 2014-07-07 – 2014-07-09 (×3): 1 mg via ORAL
  Filled 2014-07-07 (×3): qty 1

## 2014-07-07 MED ORDER — SENNA-DOCUSATE SODIUM 8.6-50 MG PO TABS
2.0000 | ORAL_TABLET | Freq: Every day | ORAL | Status: DC
  Administered 2014-07-07: 2 via ORAL
  Filled 2014-07-07 (×3): qty 2

## 2014-07-07 MED ORDER — METOPROLOL SUCCINATE ER 25 MG PO TB24
25.0000 mg | ORAL_TABLET | Freq: Two times a day (BID) | ORAL | Status: DC
  Administered 2014-07-07 – 2014-07-09 (×5): 25 mg via ORAL
  Filled 2014-07-07 (×5): qty 1

## 2014-07-07 MED ORDER — VITAMIN D (ERGOCALCIFEROL) 1.25 MG (50000 UNIT) PO CAPS
50000.0000 [IU] | ORAL_CAPSULE | ORAL | Status: DC
  Filled 2014-07-07: qty 1

## 2014-07-07 MED ORDER — LACTULOSE 10 GM/15ML PO SOLN
30.0000 g | Freq: Two times a day (BID) | ORAL | Status: DC
  Administered 2014-07-07: 40 g via ORAL
  Administered 2014-07-07: 30 g via ORAL
  Filled 2014-07-07 (×4): qty 60

## 2014-07-07 MED ORDER — ACETAMINOPHEN 325 MG PO TABS
ORAL_TABLET | ORAL | Status: AC
  Administered 2014-07-07: 650 mg via ORAL
  Filled 2014-07-07: qty 2

## 2014-07-07 MED ORDER — VANCOMYCIN HCL IN DEXTROSE 1-5 GM/200ML-% IV SOLN
INTRAVENOUS | Status: AC
Start: 2014-07-07 — End: 2014-07-07
  Administered 2014-07-07: 1000 mg via INTRAVENOUS
  Filled 2014-07-07: qty 200

## 2014-07-07 MED ORDER — GUAIFENESIN ER 600 MG PO TB12: 600.0000 mg | ORAL_TABLET | Freq: Two times a day (BID) | ORAL | Status: DC | PRN

## 2014-07-07 MED ORDER — SERTRALINE HCL 100 MG PO TABS
100.0000 mg | ORAL_TABLET | Freq: Every day | ORAL | Status: DC
  Administered 2014-07-07 – 2014-07-08 (×3): 100 mg via ORAL
  Filled 2014-07-07 (×3): qty 1

## 2014-07-07 MED ORDER — VANCOMYCIN HCL IN DEXTROSE 750-5 MG/150ML-% IV SOLN
750.0000 mg | Freq: Two times a day (BID) | INTRAVENOUS | Status: DC
  Administered 2014-07-07 – 2014-07-08 (×3): 750 mg via INTRAVENOUS
  Filled 2014-07-07 (×5): qty 150

## 2014-07-07 MED ORDER — ACETAMINOPHEN 325 MG PO TABS: 650.0000 mg | ORAL_TABLET | Freq: Four times a day (QID) | ORAL | Status: DC | PRN

## 2014-07-07 MED ORDER — POTASSIUM CHLORIDE ER 10 MEQ PO TBCR
10.0000 meq | EXTENDED_RELEASE_TABLET | Freq: Every day | ORAL | Status: DC
  Administered 2014-07-07 – 2014-07-08 (×2): 10 meq via ORAL
  Filled 2014-07-07 (×5): qty 1

## 2014-07-07 MED ORDER — ENOXAPARIN SODIUM 40 MG/0.4ML ~~LOC~~ SOLN
40.0000 mg | SUBCUTANEOUS | Status: DC
  Administered 2014-07-07 – 2014-07-09 (×4): 40 mg via SUBCUTANEOUS
  Filled 2014-07-07 (×4): qty 0.4

## 2014-07-07 MED ORDER — INSULIN ASPART 100 UNIT/ML ~~LOC~~ SOLN: 0.0000 [IU] | Freq: Three times a day (TID) | SUBCUTANEOUS | Status: DC

## 2014-07-07 MED ORDER — ASPIRIN EC 81 MG PO TBEC
81.0000 mg | DELAYED_RELEASE_TABLET | Freq: Every day | ORAL | Status: DC
  Administered 2014-07-07 – 2014-07-09 (×3): 81 mg via ORAL
  Filled 2014-07-07 (×3): qty 1

## 2014-07-07 MED ORDER — ACETAMINOPHEN 500 MG PO TABS
1000.0000 mg | ORAL_TABLET | Freq: Four times a day (QID) | ORAL | Status: DC | PRN
  Administered 2014-07-08: 1000 mg via ORAL
  Filled 2014-07-07: qty 2

## 2014-07-07 MED ORDER — PIPERACILLIN-TAZOBACTAM 3.375 G IVPB
3.3750 g | Freq: Three times a day (TID) | INTRAVENOUS | Status: DC
  Administered 2014-07-07 – 2014-07-08 (×4): 3.375 g via INTRAVENOUS
  Filled 2014-07-07 (×8): qty 50

## 2014-07-07 MED ORDER — POLYETHYLENE GLYCOL 3350 17 G PO PACK: 17.0000 g | PACK | Freq: Every day | ORAL | Status: DC | PRN

## 2014-07-07 MED ORDER — ADULT MULTIVITAMIN W/MINERALS CH
1.0000 | ORAL_TABLET | Freq: Every day | ORAL | Status: DC
  Administered 2014-07-07 – 2014-07-08 (×2): 1 via ORAL
  Filled 2014-07-07 (×2): qty 1

## 2014-07-07 MED ORDER — BUMETANIDE 1 MG PO TABS
1.0000 mg | ORAL_TABLET | Freq: Every day | ORAL | Status: DC
  Administered 2014-07-07 – 2014-07-09 (×3): 1 mg via ORAL
  Filled 2014-07-07 (×4): qty 1

## 2014-07-07 MED ORDER — SODIUM CHLORIDE 0.9 % IV SOLN
INTRAVENOUS | Status: AC
  Administered 2014-07-07 (×2): via INTRAVENOUS

## 2014-07-07 MED ORDER — BUDESONIDE-FORMOTEROL FUMARATE 80-4.5 MCG/ACT IN AERO
2.0000 | INHALATION_SPRAY | Freq: Two times a day (BID) | RESPIRATORY_TRACT | Status: DC | PRN
  Administered 2014-07-07 – 2014-07-08 (×2): 2 via RESPIRATORY_TRACT
  Filled 2014-07-07: qty 6.9

## 2014-07-07 MED ORDER — SPIRONOLACTONE 25 MG PO TABS
25.0000 mg | ORAL_TABLET | Freq: Every day | ORAL | Status: DC
  Administered 2014-07-07 – 2014-07-09 (×3): 25 mg via ORAL
  Filled 2014-07-07 (×4): qty 1

## 2014-07-07 MED ORDER — ONDANSETRON HCL 4 MG PO TABS: 4.0000 mg | ORAL_TABLET | Freq: Two times a day (BID) | ORAL | Status: DC | PRN

## 2014-07-07 MED ORDER — LAMOTRIGINE 100 MG PO TABS
150.0000 mg | ORAL_TABLET | Freq: Every day | ORAL | Status: DC
  Administered 2014-07-07 – 2014-07-08 (×2): 150 mg via ORAL
  Filled 2014-07-07 (×2): qty 2

## 2014-07-07 MED ORDER — ALLOPURINOL 100 MG PO TABS
100.0000 mg | ORAL_TABLET | Freq: Two times a day (BID) | ORAL | Status: DC
  Administered 2014-07-07 – 2014-07-09 (×5): 100 mg via ORAL
  Filled 2014-07-07 (×5): qty 1

## 2014-07-07 MED ORDER — METOCLOPRAMIDE HCL 10 MG PO TABS
5.0000 mg | ORAL_TABLET | Freq: Three times a day (TID) | ORAL | Status: DC
  Administered 2014-07-07: 10 mg via ORAL
  Administered 2014-07-07 – 2014-07-09 (×7): 5 mg via ORAL
  Filled 2014-07-07: qty 1
  Filled 2014-07-07: qty 2
  Filled 2014-07-07 (×2): qty 1
  Filled 2014-07-07: qty 5
  Filled 2014-07-07 (×3): qty 1

## 2014-07-07 MED ORDER — ARIPIPRAZOLE 5 MG PO TABS
5.0000 mg | ORAL_TABLET | Freq: Every day | ORAL | Status: DC
  Administered 2014-07-07 – 2014-07-09 (×3): 5 mg via ORAL
  Filled 2014-07-07 (×4): qty 1

## 2014-07-07 MED ORDER — ACETAMINOPHEN 650 MG RE SUPP: 650.0000 mg | Freq: Four times a day (QID) | RECTAL | Status: DC | PRN

## 2014-07-07 MED ORDER — ACETAMINOPHEN 325 MG PO TABS
650.0000 mg | ORAL_TABLET | Freq: Once | ORAL | Status: AC
  Administered 2014-07-07: 650 mg via ORAL

## 2014-07-07 MED ORDER — GABAPENTIN 300 MG PO CAPS
300.0000 mg | ORAL_CAPSULE | Freq: Every day | ORAL | Status: DC
  Administered 2014-07-07 – 2014-07-08 (×3): 300 mg via ORAL
  Filled 2014-07-07 (×3): qty 1

## 2014-07-07 MED ORDER — CYCLOBENZAPRINE HCL 10 MG PO TABS: 10.0000 mg | ORAL_TABLET | Freq: Three times a day (TID) | ORAL | Status: DC | PRN

## 2014-07-07 MED ORDER — ATORVASTATIN CALCIUM 10 MG PO TABS
10.0000 mg | ORAL_TABLET | Freq: Every day | ORAL | Status: DC
  Administered 2014-07-07 – 2014-07-08 (×2): 10 mg via ORAL
  Filled 2014-07-07 (×2): qty 1

## 2014-07-07 MED ORDER — LISINOPRIL 20 MG PO TABS
20.0000 mg | ORAL_TABLET | Freq: Every day | ORAL | Status: DC
  Administered 2014-07-07 – 2014-07-09 (×3): 20 mg via ORAL
  Filled 2014-07-07 (×3): qty 1

## 2014-07-07 MED ORDER — VITAMIN B-1 100 MG PO TABS
100.0000 mg | ORAL_TABLET | Freq: Every day | ORAL | Status: DC
  Administered 2014-07-07 – 2014-07-09 (×3): 100 mg via ORAL
  Filled 2014-07-07 (×3): qty 1

## 2014-07-07 MED ORDER — ALBUTEROL SULFATE (2.5 MG/3ML) 0.083% IN NEBU: 2.5000 mg | INHALATION_SOLUTION | RESPIRATORY_TRACT | Status: DC | PRN

## 2014-07-07 MED ORDER — SENNOSIDES-DOCUSATE SODIUM 8.6-50 MG PO TABS
1.0000 | ORAL_TABLET | Freq: Two times a day (BID) | ORAL | Status: DC
  Filled 2014-07-07: qty 1

## 2014-07-07 MED ORDER — CLONAZEPAM 0.5 MG PO TABS
0.5000 mg | ORAL_TABLET | Freq: Every day | ORAL | Status: DC
  Administered 2014-07-07 – 2014-07-08 (×3): 0.5 mg via ORAL
  Filled 2014-07-07 (×3): qty 1

## 2014-07-07 MED ORDER — PANTOPRAZOLE SODIUM 40 MG PO TBEC
40.0000 mg | DELAYED_RELEASE_TABLET | Freq: Every day | ORAL | Status: DC
  Administered 2014-07-07 – 2014-07-09 (×3): 40 mg via ORAL
  Filled 2014-07-07 (×3): qty 1

## 2014-07-07 NOTE — Progress Notes (Signed)
Iv site no longer working and d/c catheter bent, intact unable to restart iv , Dr Patsi Sears notified orper received for picc line , pt informed, [place] wellness notified

## 2014-07-07 NOTE — Evaluation (Addendum)
Physical Therapy Evaluation Patient Details Name: Samantha Mcmillan MRN: [id number] DOB: December 06, 1955 Today's Date: 07/07/2014   History of Present Illness  Pt is a 59 y/o F w/ PMHx of DM type II, HTN, COPD, chronic back pain, and cirrhosis; she was brought to ED with redness in BLE and admitted with cellulitis.She also was hyponatremic with increased fever of 103 upon admission.  Clinical Impression  Pleasant 59 yo Female came to ED with redness and swelling in BLE; Patient reports having wounds on anterior lower legs along shin which were dry and scabbed. She reports that last week her scabs fell off and her legs started getting very red and weeping and swollen. She was admitted with cellulitis. She was getting home health PT/OT prior to admittance and reports needing assistance from home health nurse for some ADLs including bathing/dressing. Patient currently is modified independent in bed mobility using bedrails, CGA for sit<>stand transfers and CGA for gait tasks being able to ambulate 160+ feet with good O2 support. Pre gait vitals were 99% SPO2, HR: 81 and post gait: 99% SPO2, HR: 86 BPM; She is on 2L O2; Patient would benefit from additional skilled PT intervention to improve balance/gait safety and improve LE strength. PT recommends home health PT upon discharge. Patient agreeable.    Follow Up Recommendations Home health PT    Equipment Recommendations       Recommendations for Other Services       Precautions / Restrictions Precautions Precautions: Fall Restrictions Weight Bearing Restrictions: No      Mobility  Bed Mobility Overal bed mobility: Modified Independent             General bed mobility comments: uses bed rail and elevated head of bed for bed mobility  Transfers Overall transfer level: Needs assistance Equipment used: Rolling walker (2 wheeled) Transfers: Sit to/from Stand Sit to Stand: Min guard         General transfer comment: required min VCs for  correct hand placement for safety;  Ambulation/Gait Ambulation/Gait assistance: Min guard Ambulation Distance (Feet): 10 Feet Assistive device: Rolling walker (2 wheeled) Gait Pattern/deviations: Step-through pattern;Decreased stride length;Decreased dorsiflexion - right;Decreased dorsiflexion - left;Narrow base of support     General Gait Details: Patient ambulated to bathroom, CGA with narrow base of support, slow gait speed; Instructed patient in gait training with RW, 160 feet, CGA with cues to increase base of support, stay close to RW and increase erect posture for better gait safety (8 min)  Stairs            Wheelchair Mobility    Modified Rankin (Stroke Patients Only)       Balance                                             Pertinent Vitals/Pain Pain Assessment: No/denies pain    Home Living Family/patient expects to be discharged to:: Private residence Living Arrangements: Spouse/significant other (ex-husband) Available Help at Discharge: Family;Available 24 hours/day Type of Home: House Home Access: Stairs to enter Entrance Stairs-Rails: Right;Left;Can reach both Entrance Stairs-Number of Steps: 3 Home Layout: One level Home Equipment: Walker - 2 wheels;Wheelchair - [employer];Other (comment);Shower seat (rollator, )      Prior Function Level of Independence: Needs assistance   Gait / Transfers Assistance Needed: used AD, needed close supervision to CGA for standing tasks.  ADL's /  Homemaking Assistance Needed: Pt reports her aid A her getting into and out of shower as well as bathing; aid also A with dressing        Hand Dominance   Dominant Hand: Right    Extremity/Trunk Assessment   Upper Extremity Assessment: Overall WFL for tasks assessed           Lower Extremity Assessment: Generalized weakness;RLE deficits/detail;LLE deficits/detail RLE Deficits / Details: BLE AROM is WFL, hip strength is grossly  4/5, ankle grossly 4-/5, deferred knee strength testing due to increased redness and tenderness along anterior lower leg.       Communication   Communication: No difficulties  Cognition Arousal/Alertness: Awake/alert Behavior During Therapy: WFL for tasks assessed/performed Overall Cognitive Status: Within Functional Limits for tasks assessed                      General Comments General comments (skin integrity, edema, etc.): Static standing balance is fair (able to stand without AD, without loss of balance); dynamic standing balance is poor (requires AD and CGA for safety with gait tasks and dynamic movement)    Exercises        Assessment/Plan    PT Assessment Patient needs continued PT services  PT Diagnosis Generalized weakness   PT Problem List Decreased strength;Decreased skin integrity;Decreased activity tolerance;Decreased mobility  PT Treatment Interventions Gait training;Patient/family education;Stair training;Functional mobility training;Therapeutic activities;Therapeutic exercise;Balance training;Neuromuscular re-education   PT Goals (Current goals can be found in the Care Plan section) Acute Rehab PT Goals Patient Stated Goal: "To go back home" PT Goal Formulation: With patient Time For Goal Achievement: 07/28/14 Potential to Achieve Goals: Good    Frequency Min 2X/week   Barriers to discharge Other (comment) (co-morbidities)      Co-evaluation               End of Session Equipment Utilized During Treatment: Gait belt;Oxygen (RW) Activity Tolerance: Patient tolerated treatment well Patient left: in chair;with call bell/phone within reach;with chair alarm set;with family/visitor present           Time: 0160-1093 PT Time Calculation (min) (ACUTE ONLY): 28 min   Charges:   PT Evaluation $Initial PT Evaluation Tier I: 1 Procedure PT Treatments $Gait Training: 8-22 mins   PT G Codes:        Alessandra Grout, PT, DPT,  [phone] 07/07/2014 10:41 AM

## 2014-07-07 NOTE — ED Notes (Signed)
Report to [facility], RN att

## 2014-07-07 NOTE — Progress Notes (Signed)
[facility] NAME: Samantha Mcmillan    MR#:  [id number]  DATE OF BIRTH:  09-20-1955  SUBJECTIVE:  Bilateral leg redness improving. ambualting in the hall. Sitting in the chair. No fever. Eating well. Husband in the room  REVIEW OF SYSTEMS:    Review of Systems  Constitutional: Negative for fever, chills and weight loss.  HENT: Negative for ear discharge, ear pain and nosebleeds.   Eyes: Negative for blurred vision, pain and discharge.  Respiratory: Positive for shortness of breath. Negative for sputum production, wheezing and stridor.   Cardiovascular: Negative for chest pain, palpitations, orthopnea and PND.  Gastrointestinal: Negative for nausea, vomiting, abdominal pain and diarrhea.  Genitourinary: Negative for urgency and frequency.  Musculoskeletal: Negative for back pain and joint pain.  Skin: Positive for rash.  Neurological: Negative for sensory change, speech change, focal weakness and weakness.  Psychiatric/Behavioral: Negative for depression. The patient is not nervous/anxious.     Tolerating Diet: Tolerating PT:      DRUG ALLERGIES:   Allergies  Allergen Reactions  . Iodine Shortness Of Breath  . Latex Anaphylaxis  . Oxycodone Shortness Of Breath  . Percocet [Oxycodone-Acetaminophen] Shortness Of Breath  . Shellfish Allergy Anaphylaxis  . Amitriptyline Other (See Comments)    Mental changes  . Fish Allergy Swelling and Other (See Comments)    "respiratory distress and wheezing"  . Tape Other (See Comments)    Pulls skin off  . Wellbutrin [Bupropion] Other (See Comments)    Mental changes  . Augmentin [Amoxicillin-Pot Clavulanate] Rash  . Codeine Rash  . Cortisone Rash  . Diphenhydramine Rash  . Other Rash    darvocet  . Vicodin [Hydrocodone-Acetaminophen] Rash    dizzy    VITALS:  Blood pressure 94/44, pulse 79, temperature 98.8 F (37.1 C), temperature source Oral, resp. rate 18, height  5\' 4"  (1.626 m), weight 82.555 kg (182 lb), SpO2 100 %.  PHYSICAL EXAMINATION:   Physical Exam  Constitutional: She is oriented to person, place, and time and well-developed, well-nourished, and in no distress.  HENT:  Nose: Nose normal.  Mouth/Throat: Oropharynx is clear and moist.  Eyes: Conjunctivae and EOM are normal. Pupils are equal, round, and reactive to light.  Neck: No thyromegaly present.  Cardiovascular: Normal rate, regular rhythm and normal heart sounds.   Pulmonary/Chest: Effort normal and breath sounds normal. No respiratory distress. She has no wheezes.  Abdominal: Soft. She exhibits no distension. There is no tenderness. There is no rebound.  Musculoskeletal: Normal range of motion. She exhibits edema.  Neurological: She is alert and oriented to person, place, and time. She displays normal reflexes. No cranial nerve deficit. Gait normal. Coordination normal.  Skin: Rash noted. There is erythema.  Psychiatric: Mood and memory normal.    LABORATORY PANEL:   CBC  Recent Labs Lab 07/07/14 0704  WBC 5.2  HGB 7.4*  HCT 23.4*  PLT 86*   ------------------------------------------------------------------------------------------------------------------  Chemistries   Recent Labs Lab 07/06/14 1808 07/07/14 0203  NA 123* 124*  K 4.7 4.0  CL 87* 93*  CO2 28 26  GLUCOSE 142* 156*  BUN 10 9  CREATININE 0.71 0.74  CALCIUM 8.2* 7.6*  AST 43*  --   ALT 17  --   ALKPHOS 188*  --   BILITOT 0.8  --    ------------------------------------------------------------------------------------------------------------------  Cardiac Enzymes No results for input(s): TROPONINI in the last 168 hours. ------------------------------------------------------------------------------------------------------------------  RADIOLOGY:  [place] Extrem Low  Bilat Comp  07/06/2014   CLINICAL DATA:  Bilateral lower extremity swelling and pain below the knee for 2 weeks.  EXAM: BILATERAL  LOWER EXTREMITY VENOUS DOPPLER ULTRASOUND  TECHNIQUE: Gray-scale sonography with graded compression, as well as color Doppler and duplex ultrasound were performed to evaluate the lower extremity deep venous systems from the level of the common femoral vein and including the common femoral, femoral, profunda femoral, popliteal and calf veins including the posterior tibial, peroneal and gastrocnemius veins when visible. The superficial great saphenous vein was also interrogated. Spectral Doppler was utilized to evaluate flow at rest and with distal augmentation maneuvers in the common femoral, femoral and popliteal veins.  COMPARISON:  None.  FINDINGS: RIGHT LOWER EXTREMITY  Common Femoral Vein: No evidence of thrombus. Normal compressibility, respiratory phasicity and response to augmentation.  Saphenofemoral Junction: No evidence of thrombus. Normal compressibility and flow on color Doppler imaging.  Profunda Femoral Vein: No evidence of thrombus. Normal compressibility and flow on color Doppler imaging.  Femoral Vein: No evidence of thrombus. Normal compressibility, respiratory phasicity and response to augmentation.  Popliteal Vein: No evidence of thrombus. Normal compressibility, respiratory phasicity and response to augmentation.  Calf Veins: The posterior tibial vein and peroneal veins are not seen.  Other Findings:  None.  LEFT LOWER EXTREMITY  Common Femoral Vein: No evidence of thrombus. Normal compressibility, respiratory phasicity and response to augmentation.  Saphenofemoral Junction: No evidence of thrombus. Normal compressibility and flow on color Doppler imaging.  Profunda Femoral Vein: No evidence of thrombus. Normal compressibility and flow on color Doppler imaging.  Femoral Vein: No evidence of thrombus. Normal compressibility, respiratory phasicity and response to augmentation.  Popliteal Vein: No evidence of thrombus. Normal compressibility, respiratory phasicity and response to augmentation.   Calf Veins: The posterior tibial vein and peroneal veins are not seen.  Other Findings:  None.  IMPRESSION: No evidence of deep venous thrombosis.   Electronically Signed   By: Abelardo Diesel M.D.   On: 07/06/2014 22:09   Dg Chest Port 1 View  07/06/2014   CLINICAL DATA:  Cough, fever.  EXAM: PORTABLE CHEST - 1 VIEW  COMPARISON:  May 15, 2014.  FINDINGS: The heart size and mediastinal contours are within normal limits. Both lungs are clear. No pneumothorax or pleural effusion is noted. The visualized skeletal structures are unremarkable.  IMPRESSION: No acute cardiopulmonary abnormality seen.   Electronically Signed   By: Marijo Conception, M.D.   On: 07/06/2014 20:14     ASSESSMENT AND PLAN:   1. Bilateral lower extremity cellulitis left more than the right. Plan:  - continue IV antibiotics Zosyn and vancomycin, wound care. Follow up CBC and blood cultures. 2. Hyponatremia, acute on chronic. Likely related to her chronic liver disease and cirrhosis. Patient stable clinically, gentle IV hydration with normal saline, follow-up BMP. Consider further workup including nephrology consultation if no improvement. 3. COPD, on home O2 2 L. Stable clinically. Continue home medications including home oxygen. 4. Diabetes mellitus type 2. Patient states she is not taking any oral medications at present. Patient stable. Sliding scale insulin, follow blood sugars. 5. Hypertension stable on home medications continue same. 6 history of liver disease/cirrhosis. Patient stable 7. Anemia of Chronic disease -hgb baseline around 9.0. Down to 7.6. No active hemorrhoidal bleed. -gets IV Ferraheme from Dr Inez Pilgrim.. Will ask Hematology to see if pt can get IV iron     All the records are reviewed and case discussed with Care Management/Social Workerr. Management plans  discussed with the patient, family and they are in agreement.  CODE STATUS: Full  DVT Prophylaxis:Lovenox  TOTAL TIME TAKING CARE OF THIS PATIENT:  25 minutes.   POSSIBLE D/C IN 1-2 DAYS, DEPENDING ON CLINICAL CONDITION.   Blair Lundeen M.D on 07/07/2014 at 3:04 PM  Between 7am to 6pm - Pager - [phone]  After 6pm go to [email or web address] - password [email or web address] [facility]  Office  [phone]  CC: Primary care physician; [facility], MD

## 2014-07-07 NOTE — H&P (Signed)
[facility] [place] NAME: Samantha Mcmillan    MR#:  [id number]  DATE OF BIRTH:  1955/06/12  DATE OF ADMISSION:  07/06/2014  PRIMARY CARE PHYSICIAN: Casilda Carls, MD   REQUESTING/REFERRING PHYSICIAN: Lisa Roca  CHIEF COMPLAINT:   Chief Complaint  Patient presents with  . Cellulitis    HISTORY OF PRESENT ILLNESS:  Samantha Mcmillan  is a 59 y.o. female with a known history of below mentioned multiple medical problems presents to the emergency room with the complaints of redness of both lower extremities as noted by her home health nurse. Patient has a history of chronic bilateral leg ulcers for which she's been taking care at home by a home health nurse. Today the home health nurse noted increased redness hence advised the patient to come to the emergency room for further evaluation. He was also gives a history of low-grade fevers and chills yesterday otherwise denies any chest pain, shortness of breath, nausea, vomiting, diarrhea, abdominal pain, dysuria. In the emergency room patient was evaluated by the ED physician and was noted to have normal white blood cell count, underwent bilateral DVT studies of the lower extremities and which is negative for DVT. Her sodium was noted to be at 123 and of note she does have a chronic hyponatremia. After sending blood cultures, patient was started on IV antibiotics namely vancomycin and Zosyn and hospitalist service was consulted for further management. She was also noted to have a temperature of 10 98F on arrival for which she received some ibuprofen and Tylenol.  PAST MEDICAL HISTORY:   Past Medical History  Diagnosis Date  . GERD (gastroesophageal reflux disease)   . Colon polyp   . Cirrhosis of liver   . Hypertension   . Depression   . COPD (chronic obstructive pulmonary disease)   . IBS (irritable bowel syndrome)   . On home oxygen therapy     "2L; 24/7" (05/04/2014)  . Family history of  adverse reaction to anesthesia     "my sister doesn't wake up good when she's put deep to sleep"  . High cholesterol   . Heart murmur   . Asthma   . Pneumonia "several times"  . Chronic bronchitis     "get it pretty much q yr"   . Sleep apnea     "can't tolerate CPAP" (05/04/2014)  . Type II diabetes mellitus   . History of blood transfusion     "related to my anemia"  . Anemia   . Iron deficiency anemia   . Headache     "more than a couple times/wk" (05/04/2014)  . Arthritis     "some in my feet" (05/04/2014)  . Chronic lower back pain   . Anxiety   . Acute on chronic diastolic CHF (congestive heart failure) 05/16/2014  . OCD (obsessive compulsive disorder)   . Panic attack   . PTSD (post-traumatic stress disorder)   . Chronic respiratory failure   . Cirrhosis of liver     PAST SURGICAL HISTORY:   Past Surgical History  Procedure Laterality Date  . Cesarean section  1979  . Cataract extraction w/ intraocular lens  implant, bilateral Bilateral 2005  . Back surgery    . Colonoscopy  2014  . Upper gastrointestinal endoscopy    . Posterior fusion cervical spine  2015    "rebuilt 3 of my neck vertebrae"  . Incision and drainage of wound Right 2009    "leg mauled  by dog"  . Tubal ligation  1979  . Dilation and curettage of uterus      SOCIAL HISTORY:   History  Substance Use Topics  . Smoking status: Current Every Day Smoker -- 0.50 packs/day for 48 years    Types: Cigarettes  . Smokeless tobacco: Never Used  . Alcohol Use: No    FAMILY HISTORY:   Family History  Problem Relation Age of Onset  . Cancer Father     pancreatic    DRUG ALLERGIES:   Allergies  Allergen Reactions  . Iodine Shortness Of Breath  . Latex Anaphylaxis  . Oxycodone Shortness Of Breath  . Percocet [Oxycodone-Acetaminophen] Shortness Of Breath  . Shellfish Allergy Anaphylaxis  . Amitriptyline Other (See Comments)    Mental changes  . Fish Allergy Swelling and Other (See Comments)     "respiratory distress and wheezing"  . Tape Other (See Comments)    Pulls skin off  . Wellbutrin [Bupropion] Other (See Comments)    Mental changes  . Augmentin [Amoxicillin-Pot Clavulanate] Rash  . Codeine Rash  . Cortisone Rash  . Diphenhydramine Rash  . Other Rash    darvocet  . Vicodin [Hydrocodone-Acetaminophen] Rash    dizzy    REVIEW OF SYSTEMS:   Review of Systems  Constitutional: Positive for fever and chills. Negative for malaise/fatigue.  HENT: Negative for ear pain, hearing loss, nosebleeds, sore throat and tinnitus.   Eyes: Negative for blurred vision, double vision, pain, discharge and redness.  Respiratory: Positive for cough. Negative for hemoptysis, sputum production, shortness of breath and wheezing.   Cardiovascular: Negative for chest pain, palpitations, orthopnea and leg swelling.  Gastrointestinal: Negative for nausea, vomiting, abdominal pain, diarrhea, constipation, blood in stool and melena.  Genitourinary: Negative for dysuria, urgency, frequency and hematuria.  Musculoskeletal: Negative for back pain, joint pain and neck pain.  Skin: Negative for itching and rash.       Redness and increased swelling of both lower extremities  Neurological: Negative for dizziness, tingling, sensory change, focal weakness and seizures.  Endo/Heme/Allergies: Does not bruise/bleed easily.  Psychiatric/Behavioral: Negative for depression. The patient is not nervous/anxious.     MEDICATIONS AT HOME:   Prior to Admission medications   Medication Sig Start Date End Date Taking? Authorizing Provider  allopurinol (ZYLOPRIM) 100 MG tablet Take 100 mg by mouth 2 (two) times daily.    Yes Historical Provider, MD  Amino Acids-Protein Hydrolys (FEEDING SUPPLEMENT, PRO-STAT SUGAR FREE 64,) LIQD Take 30 mLs by mouth daily.    Yes Historical Provider, MD  ARIPiprazole (ABILIFY) 5 MG tablet Take 5 mg by mouth daily.    Yes Historical Provider, MD  atorvastatin (LIPITOR) 10 MG  tablet Take 10 mg by mouth daily at 6 PM.    Yes Historical Provider, MD  bumetanide (BUMEX) 1 MG tablet 1 tablet daily or as directed. Patient taking differently: Take 1 mg by mouth daily. 1 tablet daily or as directed. 05/09/14  Yes Delfina Redwood, MD  buprenorphine (BUTRANS - DOSED MCG/HR) 10 MCG/HR PTWK patch Place 1 patch (10 mcg total) onto the skin once a week. 05/18/14  Yes Christina P Rama, MD  clonazePAM (KLONOPIN) 0.5 MG tablet Take 1 tablet (0.5 mg total) by mouth daily. Patient taking differently: Take 0.5 mg by mouth at bedtime.  05/22/14  Yes Estill Dooms, MD  cyclobenzaprine (FLEXERIL) 10 MG tablet Take 10 mg by mouth 3 (three) times daily as needed for muscle spasms.   Yes Historical Provider,  MD  feeding supplement, GLUCERNA SHAKE, (GLUCERNA SHAKE) LIQD Take 237 mLs by mouth 3 (three) times daily between meals. 05/18/14  Yes Venetia Maxon Rama, MD  folic acid (FOLVITE) 1 MG tablet Take 1 tablet (1 mg total) by mouth daily. 05/18/14  Yes Christina P Rama, MD  gabapentin (NEURONTIN) 300 MG capsule Take 300 mg by mouth at bedtime.   Yes Historical Provider, MD  HYDROmorphone (DILAUDID) 2 MG tablet Take 2 mg by mouth every 6 (six) hours as needed for moderate pain or severe pain.   Yes Historical Provider, MD  lactulose (CHRONULAC) 10 GM/15ML solution TAKE 30 ML BY MOUTH 2 TIMES DAILY 07/06/14  Yes Dallas Schimke, MD  lactulose, encephalopathy, (CHRONULAC) 10 GM/15ML SOLN Take 20 g by mouth 2 (two) times daily.    Yes Historical Provider, MD  lamoTRIgine (LAMICTAL) 150 MG tablet Take 150 mg by mouth at bedtime.    Yes Historical Provider, MD  levothyroxine (SYNTHROID, LEVOTHROID) 75 MCG tablet Take 75 mcg by mouth daily.    Yes Historical Provider, MD  lisinopril (PRINIVIL,ZESTRIL) 20 MG tablet Take 20 mg by mouth daily.   Yes Historical Provider, MD  magnesium oxide (MAG-OX) 400 MG tablet Take 400 mg by mouth 2 (two) times daily.   Yes Historical Provider, MD  metoCLOPramide (REGLAN) 5  MG tablet Take 5 mg by mouth 4 (four) times daily -  before meals and at bedtime.    Yes Historical Provider, MD  metoprolol succinate (TOPROL-XL) 25 MG 24 hr tablet Take 25 mg by mouth 2 (two) times daily.   Yes Historical Provider, MD  montelukast (SINGULAIR) 10 MG tablet Take 10 mg by mouth at bedtime.   Yes Historical Provider, MD  Multiple Vitamin (MULTIVITAMIN WITH MINERALS) TABS tablet Take 1 tablet by mouth daily. 05/18/14  Yes Christina P Rama, MD  pantoprazole (PROTONIX) 40 MG tablet Take 40 mg by mouth daily.   Yes Historical Provider, MD  polyethylene glycol (MIRALAX / GLYCOLAX) packet Take 17 g by mouth daily as needed for mild constipation.   Yes Historical Provider, MD  potassium chloride (K-DUR) 10 MEQ tablet Take 10 mEq by mouth at bedtime.   Yes Historical Provider, MD  Saxagliptin-Metformin 2.06-998 MG TB24 Take 1 tablet by mouth 2 (two) times daily.   Yes Historical Provider, MD  sennosides-docusate sodium (SENOKOT-S) 8.6-50 MG tablet Take 2 tablets by mouth at bedtime.    Yes Historical Provider, MD  sertraline (ZOLOFT) 100 MG tablet Take 100 mg by mouth at bedtime.    Yes Historical Provider, MD  spironolactone (ALDACTONE) 25 MG tablet Take 1 tablet (25 mg total) by mouth daily. 05/09/14 05/09/15 Yes Delfina Redwood, MD  thiamine 100 MG tablet Take 1 tablet (100 mg total) by mouth daily. 05/18/14  Yes Venetia Maxon Rama, MD  Vitamin D, Ergocalciferol, (DRISDOL) 50000 UNITS CAPS capsule Take 50,000 Units by mouth every 7 (seven) days. Saturday   Yes Historical Provider, MD  acetaminophen (TYLENOL) 500 MG tablet Take 1,000 mg by mouth every 6 (six) hours as needed for moderate pain or headache.    Historical Provider, MD  albuterol (PROVENTIL HFA;VENTOLIN HFA) 108 (90 BASE) MCG/ACT inhaler Inhale 2 puffs into the lungs every 4 (four) hours as needed for wheezing or shortness of breath.    Historical Provider, MD  budesonide-formoterol (SYMBICORT) 80-4.5 MCG/ACT inhaler Inhale 2 puffs  into the lungs 2 (two) times daily as needed (shortness of breath).     Historical Provider, MD  guaiFENesin (  MUCINEX) 600 MG 12 hr tablet Take 600 mg by mouth 2 (two) times daily as needed for cough.     Historical Provider, MD  ipratropium-albuterol (DUONEB) 0.5-2.5 (3) MG/3ML SOLN Take 3 mLs by nebulization every 4 (four) hours as needed. And Q6H PRN    Historical Provider, MD  ondansetron (ZOFRAN) 4 MG tablet Take 4 mg by mouth 2 (two) times daily as needed for nausea or vomiting.    Historical Provider, MD  Pramoxine-HC (HYDROCORTISONE ACE-PRAMOXINE) 2.5-1 % CREA Apply 1 application topically 2 (two) times daily. Patient taking differently: Apply 1 application topically 2 (two) times daily as needed.  03/15/14   Seeplaputhur Robinette Haines, MD      VITAL SIGNS:  Blood pressure 139/61, pulse 92, temperature 102.4 F (39.1 C), temperature source Rectal, resp. rate 12, height 5\' 4"  (1.626 m), weight 83.144 kg (183 lb 4.8 oz), SpO2 99 %.  PHYSICAL EXAMINATION:  Physical Exam  Constitutional: She is oriented to person, place, and time. She appears well-developed and well-nourished. No distress.  HENT:  Head: Normocephalic and atraumatic.  Right Ear: External ear normal.  Left Ear: External ear normal.  Nose: Nose normal.  Mouth/Throat: Oropharynx is clear and moist. No oropharyngeal exudate.  Eyes: EOM are normal. Pupils are equal, round, and reactive to light. No scleral icterus.  Neck: Normal range of motion. Neck supple. No JVD present. No thyromegaly present.  Cardiovascular: Normal rate, regular rhythm, normal heart sounds and intact distal pulses.  Exam reveals no friction rub.   No murmur heard. Respiratory: Effort normal and breath sounds normal. No respiratory distress. She has no wheezes. She has no rales. She exhibits no tenderness.  GI: Soft. Bowel sounds are normal. She exhibits no distension and no mass. There is no tenderness. There is no rebound and no guarding.  Musculoskeletal:  Normal range of motion. She exhibits no edema.  Lymphadenopathy:    She has no cervical adenopathy.  Neurological: She is alert and oriented to person, place, and time. She has normal reflexes. She displays normal reflexes. No cranial nerve deficit. She exhibits normal muscle tone.  Skin: Skin is warm. No rash noted. There is erythema.  Chronic ulcers on both lower extremities +,  Psychiatric: She has a normal mood and affect. Her behavior is normal. Thought content normal.   LABORATORY PANEL:   CBC  Recent Labs Lab 07/06/14 1808  WBC 7.7  HGB 9.6*  HCT 29.5*  PLT 122*   ------------------------------------------------------------------------------------------------------------------  Chemistries   Recent Labs Lab 07/06/14 1808  NA 123*  K 4.7  CL 87*  CO2 28  GLUCOSE 142*  BUN 10  CREATININE 0.71  CALCIUM 8.2*  AST 43*  ALT 17  ALKPHOS 188*  BILITOT 0.8   ------------------------------------------------------------------------------------------------------------------  Cardiac Enzymes No results for input(s): TROPONINI in the last 168 hours. ------------------------------------------------------------------------------------------------------------------  RADIOLOGY:  [place] Extrem Low Bilat Comp  07/06/2014   CLINICAL DATA:  Bilateral lower extremity swelling and pain below the knee for 2 weeks.  EXAM: BILATERAL LOWER EXTREMITY VENOUS DOPPLER ULTRASOUND  TECHNIQUE: Gray-scale sonography with graded compression, as well as color Doppler and duplex ultrasound were performed to evaluate the lower extremity deep venous systems from the level of the common femoral vein and including the common femoral, femoral, profunda femoral, popliteal and calf veins including the posterior tibial, peroneal and gastrocnemius veins when visible. The superficial great saphenous vein was also interrogated. Spectral Doppler was utilized to evaluate flow at rest and with distal augmentation  maneuvers  in the common femoral, femoral and popliteal veins.  COMPARISON:  None.  FINDINGS: RIGHT LOWER EXTREMITY  Common Femoral Vein: No evidence of thrombus. Normal compressibility, respiratory phasicity and response to augmentation.  Saphenofemoral Junction: No evidence of thrombus. Normal compressibility and flow on color Doppler imaging.  Profunda Femoral Vein: No evidence of thrombus. Normal compressibility and flow on color Doppler imaging.  Femoral Vein: No evidence of thrombus. Normal compressibility, respiratory phasicity and response to augmentation.  Popliteal Vein: No evidence of thrombus. Normal compressibility, respiratory phasicity and response to augmentation.  Calf Veins: The posterior tibial vein and peroneal veins are not seen.  Other Findings:  None.  LEFT LOWER EXTREMITY  Common Femoral Vein: No evidence of thrombus. Normal compressibility, respiratory phasicity and response to augmentation.  Saphenofemoral Junction: No evidence of thrombus. Normal compressibility and flow on color Doppler imaging.  Profunda Femoral Vein: No evidence of thrombus. Normal compressibility and flow on color Doppler imaging.  Femoral Vein: No evidence of thrombus. Normal compressibility, respiratory phasicity and response to augmentation.  Popliteal Vein: No evidence of thrombus. Normal compressibility, respiratory phasicity and response to augmentation.  Calf Veins: The posterior tibial vein and peroneal veins are not seen.  Other Findings:  None.  IMPRESSION: No evidence of deep venous thrombosis.   Electronically Signed   By: Abelardo Diesel M.D.   On: 07/06/2014 22:09   Dg Chest Port 1 View  07/06/2014   CLINICAL DATA:  Cough, fever.  EXAM: PORTABLE CHEST - 1 VIEW  COMPARISON:  May 15, 2014.  FINDINGS: The heart size and mediastinal contours are within normal limits. Both lungs are clear. No pneumothorax or pleural effusion is noted. The visualized skeletal structures are unremarkable.  IMPRESSION: No acute  cardiopulmonary abnormality seen.   Electronically Signed   By: Marijo Conception, M.D.   On: 07/06/2014 20:14    EKG:   Orders placed or performed during the hospital encounter of 05/15/14  . ED EKG normal sinus rhythm with ventricular rate of 97 bpm. No acute ST-T changes   . EKG 12-Lead  . EKG 12-Lead  . EKG    IMPRESSION AND PLAN:   1. Bilateral lower extremity cellulitis left more than the right. Plan: Admit, continue IV antibiotics namely Zosyn and vancomycin, wound care. Follow up CBC and blood cultures. 2. Hyponatremia, acute on chronic. Likely related to her chronic liver disease and cirrhosis. Patient stable clinically, gentle IV hydration with normal saline, follow-up BMP. Consider further workup including nephrology consultation if no improvement. 3. COPD, on home O2 2 L. Stable clinically. Continue home medications including home oxygen. 4. Diabetes mellitus type 2. Patient states she is not taking any oral medications at present. Patient stable. Sliding scale insulin, follow blood sugars. 5. Hypertension stable on home medications continue same. 6 history of liver disease/cirrhosis. Patient stable, monitor    All the records are reviewed and case discussed with ED provider. Management plans discussed with the patient, family and they are in agreement.  CODE STATUS: Full code  TOTAL TIME TAKING CARE OF THIS PATIENT: 50 minutes.    Juluis Mire M.D on 07/07/2014 at 12:57 AM  Between 7am to 6pm - Pager - [phone]  After 6pm go to [email or web address] - password [email or web address] [facility]  Office  [phone]  CC: Primary care physician; [facility], MD

## 2014-07-07 NOTE — Progress Notes (Signed)
Spoke to Dr. Reece Levy about morning hemoglobin results. MD would like to repeat cbc.

## 2014-07-07 NOTE — Progress Notes (Signed)
Pt is alert and oriented. Started on iv antibiotics in the emergency room. No complaints of pain this shift. Able to sleep in between care. Iv infusing without difficulty.

## 2014-07-07 NOTE — Progress Notes (Signed)
ANTIBIOTIC CONSULT NOTE - INITIAL  Pharmacy Consult for Vancomycin/Zosyn Indication: Cellulitis  Allergies  Allergen Reactions  . Iodine Shortness Of Breath  . Latex Anaphylaxis  . Oxycodone Shortness Of Breath  . Percocet [Oxycodone-Acetaminophen] Shortness Of Breath  . Shellfish Allergy Anaphylaxis  . Amitriptyline Other (See Comments)    Mental changes  . Fish Allergy Swelling and Other (See Comments)    "respiratory distress and wheezing"  . Tape Other (See Comments)    Pulls skin off  . Wellbutrin [Bupropion] Other (See Comments)    Mental changes  . Augmentin [Amoxicillin-Pot Clavulanate] Rash  . Codeine Rash  . Cortisone Rash  . Diphenhydramine Rash  . Other Rash    darvocet  . Vicodin [Hydrocodone-Acetaminophen] Rash    dizzy    Patient Measurements: Height: 5\' 4"  (162.6 cm) Weight: 183 lb 4.8 oz (83.144 kg) IBW/kg (Calculated) : 54.7 Adjusted Body Weight: 66.1 kg  Vital Signs: Temp: 97.4 F (36.3 C) (05/14 0149) Temp Source: Oral (05/14 0149) BP: 109/55 mmHg (05/14 0149) Pulse Rate: 88 (05/14 0149) Intake/Output from previous day:   Intake/Output from this shift:    Labs:  Recent Labs  07/06/14 1808  WBC 7.7  HGB 9.6*  PLT 122*  CREATININE 0.71   Estimated Creatinine Clearance: 78 mL/min (by C-G formula based on Cr of 0.71). No results for input(s): VANCOTROUGH, VANCOPEAK, VANCORANDOM, GENTTROUGH, GENTPEAK, GENTRANDOM, TOBRATROUGH, TOBRAPEAK, TOBRARND, AMIKACINPEAK, AMIKACINTROU, AMIKACIN in the last 72 hours.   Microbiology: No results found for this or any previous visit (from the past 720 hour(s)).  Medical History: Past Medical History  Diagnosis Date  . GERD (gastroesophageal reflux disease)   . Colon polyp   . Cirrhosis of liver   . Hypertension   . Depression   . COPD (chronic obstructive pulmonary disease)   . IBS (irritable bowel syndrome)   . On home oxygen therapy     "2L; 24/7" (05/04/2014)  . Family history of adverse  reaction to anesthesia     "my sister doesn't wake up good when she's put deep to sleep"  . High cholesterol   . Heart murmur   . Asthma   . Pneumonia "several times"  . Chronic bronchitis     "get it pretty much q yr"   . Sleep apnea     "can't tolerate CPAP" (05/04/2014)  . Type II diabetes mellitus   . History of blood transfusion     "related to my anemia"  . Anemia   . Iron deficiency anemia   . Headache     "more than a couple times/wk" (05/04/2014)  . Arthritis     "some in my feet" (05/04/2014)  . Chronic lower back pain   . Anxiety   . Acute on chronic diastolic CHF (congestive heart failure) 05/16/2014  . OCD (obsessive compulsive disorder)   . Panic attack   . PTSD (post-traumatic stress disorder)   . Chronic respiratory failure   . Cirrhosis of liver     Medications:  Scheduled:  . allopurinol  100 mg Oral BID  . ARIPiprazole  5 mg Oral Daily  . aspirin EC  81 mg Oral Daily  . atorvastatin  10 mg Oral q1800  . bumetanide  1 mg Oral Daily  . clonazePAM  0.5 mg Oral QHS  . enoxaparin (LOVENOX) injection  40 mg Subcutaneous Q24H  . folic acid  1 mg Oral Daily  . gabapentin  300 mg Oral QHS  . insulin aspart  0-15 Units  Subcutaneous TID WC  . lactulose  30 g Oral BID  . lamoTRIgine  150 mg Oral QHS  . levothyroxine  75 mcg Oral QAC breakfast  . lisinopril  20 mg Oral Daily  . magnesium oxide  400 mg Oral BID  . metoCLOPramide  5 mg Oral TID AC & HS  . metoprolol succinate  25 mg Oral BID  . montelukast  10 mg Oral QHS  . multivitamin with minerals  1 tablet Oral Daily  . pantoprazole  40 mg Oral Daily  . piperacillin-tazobactam (ZOSYN)  IV  3.375 g Intravenous 3 times per day  . potassium chloride  10 mEq Oral QHS  . sennosides-docusate sodium  2 tablet Oral QHS  . sertraline  100 mg Oral QHS  . spironolactone  25 mg Oral Daily  . thiamine  100 mg Oral Daily  . vancomycin  750 mg Intravenous Q12H  . Vitamin D (Ergocalciferol)  50,000 Units Oral Q7 days    Infusions:  . sodium chloride     Assessment: Empiric abx for b/l LE cellulitis.   Goal of Therapy:  Vancomycin trough level 10-15 mcg/ml  Plan:  Zosyn 3.375 g iv once then 3.375 g EI q 8 h. Vancomycin 1000 mg iv once then 750 mg iv q 12 h with stacked dosing. Will check a vancomycin trough with the 4th dose.  Measure antibiotic drug levels at steady state Follow up culture results  Ulice Dash D 07/07/2014,2:15 AM

## 2014-07-08 LAB — URINE CULTURE: Culture: NO GROWTH

## 2014-07-08 LAB — GLUCOSE, CAPILLARY
GLUCOSE-CAPILLARY: 100 mg/dL — AB (ref 65–99)
Glucose-Capillary: 107 mg/dL — ABNORMAL HIGH (ref 65–99)
Glucose-Capillary: 120 mg/dL — ABNORMAL HIGH (ref 65–99)
Glucose-Capillary: 196 mg/dL — ABNORMAL HIGH (ref 65–99)

## 2014-07-08 LAB — VANCOMYCIN, TROUGH: VANCOMYCIN TR: 19 ug/mL (ref 10–20)

## 2014-07-08 MED ORDER — CLINDAMYCIN HCL 150 MG PO CAPS
600.0000 mg | ORAL_CAPSULE | Freq: Three times a day (TID) | ORAL | Status: DC
  Administered 2014-07-08 – 2014-07-09 (×3): 600 mg via ORAL
  Filled 2014-07-08 (×3): qty 4

## 2014-07-08 NOTE — Progress Notes (Signed)
ANTIBIOTIC CONSULT NOTE - INITIAL  Pharmacy Consult for Vancomycin/Zosyn Indication: Cellulitis  Allergies  Allergen Reactions  . Iodine Shortness Of Breath  . Latex Anaphylaxis  . Oxycodone Shortness Of Breath  . Percocet [Oxycodone-Acetaminophen] Shortness Of Breath  . Shellfish Allergy Anaphylaxis  . Amitriptyline Other (See Comments)    Mental changes  . Fish Allergy Swelling and Other (See Comments)    "respiratory distress and wheezing"  . Tape Other (See Comments)    Pulls skin off  . Wellbutrin [Bupropion] Other (See Comments)    Mental changes  . Augmentin [Amoxicillin-Pot Clavulanate] Rash  . Codeine Rash  . Cortisone Rash  . Diphenhydramine Rash  . Other Rash    darvocet  . Vicodin [Hydrocodone-Acetaminophen] Rash    dizzy    Patient Measurements: Height: 5\' 4"  (162.6 cm) Weight: 182 lb (82.555 kg) IBW/kg (Calculated) : 54.7 Adjusted Body Weight: 66.1 kg  Vital Signs: Temp: 98.5 F (36.9 C) (05/15 0806) Temp Source: Oral (05/15 0806) BP: 110/54 mmHg (05/15 0806) Pulse Rate: 82 (05/15 0806)  Labs:  Recent Labs  07/06/14 1808 07/07/14 0203 07/07/14 0704  WBC 7.7 6.1 5.2  HGB 9.6* 7.7* 7.4*  PLT 122* 87* 86*  CREATININE 0.71 0.74  --    Estimated Creatinine Clearance: 77.8 mL/min (by C-G formula based on Cr of 0.74).  Recent Labs  07/08/14 0501  VANCOTROUGH 19     Microbiology: Recent Results (from the past 720 hour(s))  Culture, blood (routine x 2)     Status: None (Preliminary result)   Collection Time: 07/06/14  6:08 PM  Result Value Ref Range Status   Specimen Description BLOOD  Final   Special Requests NONE  Final   Culture NO GROWTH 2 DAYS  Final   Report Status PENDING  Incomplete  Culture, blood (routine x 2)     Status: None (Preliminary result)   Collection Time: 07/06/14  7:00 PM  Result Value Ref Range Status   Specimen Description BLOOD  Final   Special Requests NONE  Final   Culture  Setup Time   Final    ANAEROBIC  BOTTLE ONLY GRAM POSITIVE COCCI IN CHAINS CRITICAL RESULT CALLED TO, READ BACK BY AND VERIFIED WITH: BRENDA BECKER AT 53 BY JEF    Culture   Final    STREPTOCOCCUS GROUP G ANAEROBIC BOTTLE ONLY There is no known Penicillin Resistant Beta Streptococcus in the [place] For patients that are Penicillin-allergic, Erythromycin is 85-94% susceptible, and Clindamycin is 80% susceptible.  Contact Microbiology within 7 days if sensitivity testing is  required.   SUSCEPTIBILITIES TO FOLLOW    Report Status PENDING  Incomplete  Urine culture     Status: None   Collection Time: 07/06/14  7:00 PM  Result Value Ref Range Status   Specimen Description URINE, CLEAN CATCH  Final   Special Requests NONE  Final   Culture NO GROWTH 2 DAYS  Final   Report Status 07/08/2014 FINAL  Final    Medications:  Scheduled:  . allopurinol  100 mg Oral BID  . ARIPiprazole  5 mg Oral Daily  . aspirin EC  81 mg Oral Daily  . atorvastatin  10 mg Oral q1800  . bumetanide  1 mg Oral Daily  . clonazePAM  0.5 mg Oral QHS  . enoxaparin (LOVENOX) injection  40 mg Subcutaneous Q24H  . folic acid  1 mg Oral Daily  . gabapentin  300 mg Oral QHS  . insulin aspart  0-15 Units Subcutaneous TID  WC  . lactulose  30 g Oral BID  . lamoTRIgine  150 mg Oral QHS  . levothyroxine  75 mcg Oral QAC breakfast  . lisinopril  20 mg Oral Daily  . magnesium oxide  400 mg Oral BID  . metoCLOPramide  5 mg Oral TID AC & HS  . metoprolol succinate  25 mg Oral BID  . montelukast  10 mg Oral QHS  . multivitamin with minerals  1 tablet Oral Daily  . pantoprazole  40 mg Oral Daily  . piperacillin-tazobactam (ZOSYN)  IV  3.375 g Intravenous 3 times per day  . potassium chloride  10 mEq Oral QHS  . senna-docusate  1 tablet Oral BID  . sennosides-docusate sodium  2 tablet Oral QHS  . sertraline  100 mg Oral QHS  . spironolactone  25 mg Oral Daily  . thiamine  100 mg Oral Daily  . vancomycin  750 mg Intravenous Q12H  . Vitamin D  (Ergocalciferol)  50,000 Units Oral Q7 days   Infusions:    Assessment: Empiric abx for b/l LE cellulitis.   Goal of Therapy:  Vancomycin trough level 10-15 mcg/ml  Plan:  Zosyn 3.375 g iv once then 3.375 g EI q 8 h.  Vancomycin "trough" drawn 6 hours after dose, not at steady state. Will continue current orders for vancomyin 750mg  IV Q12H and recheck trough 5/16 at 08:30. This should be at steady state.   Measure antibiotic drug levels at steady state Follow up culture results  Samantha Mcmillan C 07/08/2014,10:12 AM

## 2014-07-08 NOTE — Progress Notes (Signed)
[place] [facility] [place] NAME: Samantha Mcmillan    MR#:  [id number]  DATE OF BIRTH:  10/04/1955  SUBJECTIVE:  Bilateral leg redness improving. ambulating in the hall. Sitting in the chair. No fever. Eating well. Husband in the room  REVIEW OF SYSTEMS:    Review of Systems  Constitutional: Negative for fever, chills and weight loss.  HENT: Negative for ear discharge, ear pain and nosebleeds.   Eyes: Negative for blurred vision, pain and discharge.  Respiratory: Negative for sputum production, shortness of breath, wheezing and stridor.   Cardiovascular: Negative for chest pain, palpitations, orthopnea and PND.  Gastrointestinal: Negative for nausea, vomiting, abdominal pain and diarrhea.  Genitourinary: Negative for urgency and frequency.  Musculoskeletal: Negative for back pain and joint pain.  Neurological: Negative for sensory change, speech change, focal weakness and weakness.  Psychiatric/Behavioral: Negative for depression and hallucinations. The patient is not nervous/anxious.      DRUG ALLERGIES:   Allergies  Allergen Reactions  . Iodine Shortness Of Breath  . Latex Anaphylaxis  . Oxycodone Shortness Of Breath  . Percocet [Oxycodone-Acetaminophen] Shortness Of Breath  . Shellfish Allergy Anaphylaxis  . Amitriptyline Other (See Comments)    Mental changes  . Fish Allergy Swelling and Other (See Comments)    "respiratory distress and wheezing"  . Tape Other (See Comments)    Pulls skin off  . Wellbutrin [Bupropion] Other (See Comments)    Mental changes  . Augmentin [Amoxicillin-Pot Clavulanate] Rash  . Codeine Rash  . Cortisone Rash  . Diphenhydramine Rash  . Other Rash    darvocet  . Vicodin [Hydrocodone-Acetaminophen] Rash    dizzy    VITALS:  Blood pressure 122/62, pulse 76, temperature 98.5 F (36.9 C), temperature source Oral, resp. rate 18, height 5\' 4"  (1.626 m), weight 82.555 kg (182 lb), SpO2 100  %.  PHYSICAL EXAMINATION:   Physical Exam  Constitutional: She is oriented to person, place, and time and well-developed, well-nourished, and in no distress.  HENT:  Nose: Nose normal.  Mouth/Throat: Oropharynx is clear and moist.  Eyes: Conjunctivae and EOM are normal. Pupils are equal, round, and reactive to light.  Neck: No thyromegaly present.  Cardiovascular: Normal rate, regular rhythm and normal heart sounds.   Pulmonary/Chest: Effort normal and breath sounds normal. No respiratory distress. She has no wheezes.  Abdominal: Soft. She exhibits no distension. There is no tenderness. There is no rebound.  Musculoskeletal: Normal range of motion. She exhibits edema.  Neurological: She is alert and oriented to person, place, and time. She displays normal reflexes. No cranial nerve deficit. Gait normal. Coordination normal.  Skin: Rash noted. There is erythema.  Psychiatric: Mood and memory normal.    LABORATORY PANEL:   CBC  Recent Labs Lab 07/07/14 0704  WBC 5.2  HGB 7.4*  HCT 23.4*  PLT 86*   ------------------------------------------------------------------------------------------------------------------  Chemistries   Recent Labs Lab 07/06/14 1808 07/07/14 0203  NA 123* 124*  K 4.7 4.0  CL 87* 93*  CO2 28 26  GLUCOSE 142* 156*  BUN 10 9  CREATININE 0.71 0.74  CALCIUM 8.2* 7.6*  AST 43*  --   ALT 17  --   ALKPHOS 188*  --   BILITOT 0.8  --    ------------------------------------------------------------------------------------------------------------------  Cardiac Enzymes No results for input(s): TROPONINI in the last 168 hours. ------------------------------------------------------------------------------------------------------------------  RADIOLOGY:  [place] Extrem Low Bilat Comp  07/06/2014   CLINICAL DATA:  Bilateral lower extremity swelling  and pain below the knee for 2 weeks.  EXAM: BILATERAL LOWER EXTREMITY VENOUS DOPPLER ULTRASOUND  TECHNIQUE:  Gray-scale sonography with graded compression, as well as color Doppler and duplex ultrasound were performed to evaluate the lower extremity deep venous systems from the level of the common femoral vein and including the common femoral, femoral, profunda femoral, popliteal and calf veins including the posterior tibial, peroneal and gastrocnemius veins when visible. The superficial great saphenous vein was also interrogated. Spectral Doppler was utilized to evaluate flow at rest and with distal augmentation maneuvers in the common femoral, femoral and popliteal veins.  COMPARISON:  None.  FINDINGS: RIGHT LOWER EXTREMITY  Common Femoral Vein: No evidence of thrombus. Normal compressibility, respiratory phasicity and response to augmentation.  Saphenofemoral Junction: No evidence of thrombus. Normal compressibility and flow on color Doppler imaging.  Profunda Femoral Vein: No evidence of thrombus. Normal compressibility and flow on color Doppler imaging.  Femoral Vein: No evidence of thrombus. Normal compressibility, respiratory phasicity and response to augmentation.  Popliteal Vein: No evidence of thrombus. Normal compressibility, respiratory phasicity and response to augmentation.  Calf Veins: The posterior tibial vein and peroneal veins are not seen.  Other Findings:  None.  LEFT LOWER EXTREMITY  Common Femoral Vein: No evidence of thrombus. Normal compressibility, respiratory phasicity and response to augmentation.  Saphenofemoral Junction: No evidence of thrombus. Normal compressibility and flow on color Doppler imaging.  Profunda Femoral Vein: No evidence of thrombus. Normal compressibility and flow on color Doppler imaging.  Femoral Vein: No evidence of thrombus. Normal compressibility, respiratory phasicity and response to augmentation.  Popliteal Vein: No evidence of thrombus. Normal compressibility, respiratory phasicity and response to augmentation.  Calf Veins: The posterior tibial vein and peroneal veins  are not seen.  Other Findings:  None.  IMPRESSION: No evidence of deep venous thrombosis.   Electronically Signed   By: Abelardo Diesel M.D.   On: 07/06/2014 22:09   Dg Chest Port 1 View  07/07/2014   CLINICAL DATA:  Central line placement.  Initial encounter.  EXAM: PORTABLE CHEST - 1 VIEW  COMPARISON:  Chest radiograph performed 07/06/2014  FINDINGS: The patient's left PICC is noted ending about the proximal SVC.  Mildly increased interstitial markings could reflect a mild infectious process, given the patient's symptoms. No pleural effusion or pneumothorax is seen.  The cardiomediastinal silhouette is normal in size. No acute osseous abnormalities are identified. Cervical spinal fusion hardware is partially imaged.  IMPRESSION: 1. Left PICC noted ending about the proximal SVC. 2. Mildly increased interstitial markings could reflect a mild infectious process, given the patient's symptoms.   Electronically Signed   By: Garald Balding M.D.   On: 07/07/2014 22:48   Dg Chest Port 1 View  07/06/2014   CLINICAL DATA:  Cough, fever.  EXAM: PORTABLE CHEST - 1 VIEW  COMPARISON:  May 15, 2014.  FINDINGS: The heart size and mediastinal contours are within normal limits. Both lungs are clear. No pneumothorax or pleural effusion is noted. The visualized skeletal structures are unremarkable.  IMPRESSION: No acute cardiopulmonary abnormality seen.   Electronically Signed   By: Marijo Conception, M.D.   On: 07/06/2014 20:14     ASSESSMENT AND PLAN:   1. Bilateral lower extremity cellulitis left more than the right. Plan:  - continue IV antibiotics Zosyn and vancomycin, wound care. -BC 1/2 strepto G -change to po clinda 2. Hyponatremia, acute on chronic. Likely related to her chronic liver disease and cirrhosis. Patient stable clinically, gentle  IV hydration with normal saline, follow-up BMP. 3. COPD, on home O2 2 L. Stable clinically. Continue home medications including home oxygen. 4. Diabetes mellitus type 2.  Patient states she is not taking any oral medications at present. Patient stable. Sliding scale insulin, follow blood sugars. 5. Hypertension stable on home medications continue same. 6 history of liver disease/cirrhosis. Patient stable 7. Anemia of Chronic disease -hgb baseline around 9.0. Down to 7.6. No active hemorrhoidal bleed. -gets IV Ferraheme from Dr Inez Pilgrim.. Will ask Hematology to see if pt can get IV iron     All the records are reviewed and case discussed with Care Management/Social Workerr. Management plans discussed with the patient, family and they are in agreement.  CODE STATUS: Full  DVT Prophylaxis:Lovenox  TOTAL TIME TAKING CARE OF THIS PATIENT: 25 minutes.   POSSIBLE D/C IN 1-2 DAYS, DEPENDING ON CLINICAL CONDITION.   Levia Waltermire M.D on 07/08/2014 at 1:51 PM  Between 7am to 6pm - Pager - [phone]  After 6pm go to [email or web address] - password [email or web address] [facility]  Office  [phone]  CC: Primary care physician; [facility], [place]

## 2014-07-08 NOTE — Progress Notes (Signed)
Pt new picc line placed this shift. Continue on IV ABX. Needs assistance ambulating to BR with walker and SBA. Upper and lower extremities bruised. Lower extremities reddened and edematous.

## 2014-07-09 ENCOUNTER — Other Ambulatory Visit: Payer: Medicare Other

## 2014-07-09 ENCOUNTER — Ambulatory Visit: Payer: Medicare Other | Admitting: Internal Medicine

## 2014-07-09 ENCOUNTER — Ambulatory Visit: Payer: Medicare Other

## 2014-07-09 LAB — BASIC METABOLIC PANEL
Anion gap: 5 (ref 5–15)
BUN: 18 mg/dL (ref 6–20)
CHLORIDE: 94 mmol/L — AB (ref 101–111)
CO2: 28 mmol/L (ref 22–32)
CREATININE: 0.83 mg/dL (ref 0.44–1.00)
Calcium: 7.6 mg/dL — ABNORMAL LOW (ref 8.9–10.3)
GFR calc Af Amer: >60 mL/min (ref >60)
GFR calc non Af Amer: >60 mL/min (ref >60)
Glucose, Bld: 72 mg/dL (ref 65–99)
POTASSIUM: 4.2 mmol/L (ref 3.5–5.1)
Sodium: 127 mmol/L — ABNORMAL LOW (ref 135–145)

## 2014-07-09 LAB — GLUCOSE, CAPILLARY: Glucose-Capillary: 65 mg/dL (ref 65–99)

## 2014-07-09 LAB — VANCOMYCIN, TROUGH: Vancomycin Tr: 12 ug/mL (ref 10–20)

## 2014-07-09 MED ORDER — CEPHALEXIN 500 MG PO CAPS: 500.0000 mg | ORAL_CAPSULE | Freq: Two times a day (BID) | ORAL | Status: DC

## 2014-07-09 MED ORDER — SODIUM CHLORIDE 0.9 % IV SOLN
510.0000 mg | Freq: Once | INTRAVENOUS | Status: AC
  Administered 2014-07-09: 510 mg via INTRAVENOUS
  Filled 2014-07-09: qty 17

## 2014-07-09 MED ORDER — CEPHALEXIN 500 MG PO CAPS
500.0000 mg | ORAL_CAPSULE | Freq: Two times a day (BID) | ORAL | Status: DC
  Administered 2014-07-09: 500 mg via ORAL
  Filled 2014-07-09: qty 1

## 2014-07-09 NOTE — Discharge Summary (Signed)
[facility] [place] NAME: Samantha Mcmillan    MR#:  [id number]  DATE OF BIRTH:  1956-01-18  DATE OF ADMISSION:  07/06/2014 ADMITTING PHYSICIAN: Juluis Mire, MD  DATE OF DISCHARGE: 07/09/14  PRIMARY CARE PHYSICIAN: JADALI,FAYEGH, MD    ADMISSION DIAGNOSIS:  Hyponatremia [E87.1] Cellulitis of left lower extremity [L03.116]  DISCHARGE DIAGNOSIS:  Bilateral LE cellulitis Chronic venous ulcers with venous stasis  SECONDARY DIAGNOSIS:   Past Medical History  Diagnosis Date  . GERD (gastroesophageal reflux disease)   . Colon polyp   . Cirrhosis of liver   . Hypertension   . Depression   . COPD (chronic obstructive pulmonary disease)   . IBS (irritable bowel syndrome)   . On home oxygen therapy     "2L; 24/7" (05/04/2014)  . Family history of adverse reaction to anesthesia     "my sister doesn't wake up good when she's put deep to sleep"  . High cholesterol   . Heart murmur   . Asthma   . Pneumonia "several times"  . Chronic bronchitis     "get it pretty much q yr"   . Sleep apnea     "can't tolerate CPAP" (05/04/2014)  . Type II diabetes mellitus   . History of blood transfusion     "related to my anemia"  . Anemia   . Iron deficiency anemia   . Headache     "more than a couple times/wk" (05/04/2014)  . Arthritis     "some in my feet" (05/04/2014)  . Chronic lower back pain   . Anxiety   . Acute on chronic diastolic CHF (congestive heart failure) 05/16/2014  . OCD (obsessive compulsive disorder)   . Panic attack   . PTSD (post-traumatic stress disorder)   . Chronic respiratory failure   . Cirrhosis of liver     HOSPITAL COURSE:   1. Bilateral lower extremity cellulitis left more than the right.  - continue IV antibiotics Zosyn and vancomycin, wound care.--->changed to po keflex -pt had her last Cellulitis in dec 2015 and was given augmentin at d/c . Pt had rash. She tell me she has taken Keflex w/o any  issues -curbsided Dr fitzgerald. Ok to d/c with keflex, pt will f/u with him in a week. Will obtian wound cultures prior to d/c (not done in ER)  2. Hyponatremia, acute on chronic. Likely related to her chronic liver disease and cirrhosis. Patient stable clinically, received gentle IV hydration with normal saline -na upto 127 (baseline)  3. COPD, on home O2 2 L. Stable clinically. Continue home medications including home oxygen.  4. Diabetes mellitus type 2. Patient states she is not taking any oral medications at present. Patient stable. Sliding scale insulin, follow blood sugars.  5. Hypertension stable on home medications continue same.  6 history of liver disease/cirrhosis. Patient stable  7. Anemia of Chronic disease -hgb baseline around 9.0. Down to 7.6. No active hemorrhoidal bleed. -gets IV Ferraheme from Dr Inez Pilgrim..  -spoke with dr Inez Pilgrim and will give IV ferraheme prior to d/c  8.Resuem previous home health serivces  DISCHARGE CONDITIONS:   fair  CONSULTS OBTAINED:    none DRUG ALLERGIES:   Allergies  Allergen Reactions  . Iodine Shortness Of Breath  . Latex Anaphylaxis  . Oxycodone Shortness Of Breath  . Percocet [Oxycodone-Acetaminophen] Shortness Of Breath  . Shellfish Allergy Anaphylaxis  . Amitriptyline Other (See Comments)    Mental changes  . Fish  Allergy Swelling and Other (See Comments)    "respiratory distress and wheezing"  . Tape Other (See Comments)    Pulls skin off  . Wellbutrin [Bupropion] Other (See Comments)    Mental changes  . Augmentin [Amoxicillin-Pot Clavulanate] Rash  . Codeine Rash  . Cortisone Rash  . Diphenhydramine Rash  . Other Rash    darvocet  . Vicodin [Hydrocodone-Acetaminophen] Rash    dizzy    DISCHARGE MEDICATIONS:   Current Discharge Medication List    START taking these medications   Details  cephALEXin (KEFLEX) 500 MG capsule Take 1 capsule (500 mg total) by mouth 2 (two) times daily. Qty: 20 capsule,  Refills: 0      CONTINUE these medications which have NOT CHANGED   Details  allopurinol (ZYLOPRIM) 100 MG tablet Take 100 mg by mouth 2 (two) times daily.     Amino Acids-Protein Hydrolys (FEEDING SUPPLEMENT, PRO-STAT SUGAR FREE 64,) LIQD Take 30 mLs by mouth daily.     ARIPiprazole (ABILIFY) 5 MG tablet Take 5 mg by mouth daily.     atorvastatin (LIPITOR) 10 MG tablet Take 10 mg by mouth daily at 6 PM.     bumetanide (BUMEX) 1 MG tablet 1 tablet daily or as directed. Qty: 30 tablet, Refills: 0    buprenorphine (BUTRANS - DOSED MCG/HR) 10 MCG/HR PTWK patch Place 1 patch (10 mcg total) onto the skin once a week. Qty: 4 patch, Refills: 0    clonazePAM (KLONOPIN) 0.5 MG tablet Take 1 tablet (0.5 mg total) by mouth daily. Qty: 60 tablet, Refills: 0    cyclobenzaprine (FLEXERIL) 10 MG tablet Take 10 mg by mouth 3 (three) times daily as needed for muscle spasms.    feeding supplement, GLUCERNA SHAKE, (GLUCERNA SHAKE) LIQD Take 237 mLs by mouth 3 (three) times daily between meals. Refills: 0    folic acid (FOLVITE) 1 MG tablet Take 1 tablet (1 mg total) by mouth daily.    gabapentin (NEURONTIN) 300 MG capsule Take 300 mg by mouth at bedtime.    HYDROmorphone (DILAUDID) 2 MG tablet Take 2 mg by mouth every 6 (six) hours as needed for moderate pain or severe pain.    !! lactulose (CHRONULAC) 10 GM/15ML solution TAKE 30 ML BY MOUTH 2 TIMES DAILY Qty: 480 mL, Refills: 0   Associated Diagnoses: Constipation, unspecified constipation type    !! lactulose, encephalopathy, (CHRONULAC) 10 GM/15ML SOLN Take 20 g by mouth 2 (two) times daily.     lamoTRIgine (LAMICTAL) 150 MG tablet Take 150 mg by mouth at bedtime.     levothyroxine (SYNTHROID, LEVOTHROID) 75 MCG tablet Take 75 mcg by mouth daily.     lisinopril (PRINIVIL,ZESTRIL) 20 MG tablet Take 20 mg by mouth daily.    magnesium oxide (MAG-OX) 400 MG tablet Take 400 mg by mouth 2 (two) times daily.    metoCLOPramide (REGLAN) 5 MG  tablet Take 5 mg by mouth 4 (four) times daily -  before meals and at bedtime.     metoprolol succinate (TOPROL-XL) 25 MG 24 hr tablet Take 25 mg by mouth 2 (two) times daily.    montelukast (SINGULAIR) 10 MG tablet Take 10 mg by mouth at bedtime.    Multiple Vitamin (MULTIVITAMIN WITH MINERALS) TABS tablet Take 1 tablet by mouth daily.    pantoprazole (PROTONIX) 40 MG tablet Take 40 mg by mouth daily.    polyethylene glycol (MIRALAX / GLYCOLAX) packet Take 17 g by mouth daily as needed for mild constipation.  potassium chloride (K-DUR) 10 MEQ tablet Take 10 mEq by mouth at bedtime.    Saxagliptin-Metformin 2.06-998 MG TB24 Take 1 tablet by mouth 2 (two) times daily.    sennosides-docusate sodium (SENOKOT-S) 8.6-50 MG tablet Take 2 tablets by mouth at bedtime.     sertraline (ZOLOFT) 100 MG tablet Take 100 mg by mouth at bedtime.     spironolactone (ALDACTONE) 25 MG tablet Take 1 tablet (25 mg total) by mouth daily. Qty: 30 tablet, Refills: 0    thiamine 100 MG tablet Take 1 tablet (100 mg total) by mouth daily.    Vitamin D, Ergocalciferol, (DRISDOL) 50000 UNITS CAPS capsule Take 50,000 Units by mouth every 7 (seven) days. Saturday    acetaminophen (TYLENOL) 500 MG tablet Take 1,000 mg by mouth every 6 (six) hours as needed for moderate pain or headache.    albuterol (PROVENTIL HFA;VENTOLIN HFA) 108 (90 BASE) MCG/ACT inhaler Inhale 2 puffs into the lungs every 4 (four) hours as needed for wheezing or shortness of breath.    budesonide-formoterol (SYMBICORT) 80-4.5 MCG/ACT inhaler Inhale 2 puffs into the lungs 2 (two) times daily as needed (shortness of breath).     guaiFENesin (MUCINEX) 600 MG 12 hr tablet Take 600 mg by mouth 2 (two) times daily as needed for cough.     ipratropium-albuterol (DUONEB) 0.5-2.5 (3) MG/3ML SOLN Take 3 mLs by nebulization every 4 (four) hours as needed. And Q6H PRN    ondansetron (ZOFRAN) 4 MG tablet Take 4 mg by mouth 2 (two) times daily as  needed for nausea or vomiting.    Pramoxine-HC (HYDROCORTISONE ACE-PRAMOXINE) 2.5-1 % CREA Apply 1 application topically 2 (two) times daily. Qty: 1 Tube, Refills: 0   Associated Diagnoses: Hemorrhoids, unspecified hemorrhoid type     !! - Potential duplicate medications found. Please discuss with provider.       DISCHARGE INSTRUCTIONS:   As above  If you experience worsening of your admission symptoms, develop shortness of breath, life threatening emergency, suicidal or homicidal thoughts you must seek medical attention immediately by calling 911 or calling your MD immediately  if symptoms less severe.  You Must read complete instructions/literature along with all the possible adverse reactions/side effects for all the Medicines you take and that have been prescribed to you. Take any new Medicines after you have completely understood and accept all the possible adverse reactions/side effects.   Please note  You were cared for by a hospitalist during your hospital stay. If you have any questions about your discharge medications or the care you received while you were in the hospital after you are discharged, you can call the unit and asked to speak with the hospitalist on call if the hospitalist that took care of you is not available. Once you are discharged, your primary care physician will handle any further medical issues. Please note that NO REFILLS for any discharge medications will be authorized once you are discharged, as it is imperative that you return to your primary care physician (or establish a relationship with a primary care physician if you do not have one) for your aftercare needs so that they can reassess your need for medications and monitor your lab values.    Today   SUBJECTIVE  Doing well. 2 small erythema patches on right leg. No fever  VITAL SIGNS:  Blood pressure 113/66, pulse 58, temperature 97.6 F (36.4 C), temperature source Oral, resp. rate 18, height 5'  4" (1.626 m), weight 82.555 kg (182 lb), SpO2  100 %.  I/O:   Intake/Output Summary (Last 24 hours) at 07/09/14 0918 Last data filed at 07/08/14 1800  Gross per 24 hour  Intake    480 ml  Output      1 ml  Net    479 ml    PHYSICAL EXAMINATION:  GENERAL:  59 y.o.-year-old patient lying in the bed with no acute distress.  -appears chronically ill EYES: Pupils equal, round, reactive to light and accommodation. No scleral icterus. Extraocular muscles intact.  HEENT: Head atraumatic, normocephalic. Oropharynx and nasopharynx clear.  NECK:  Supple, no jugular venous distention. No thyroid enlargement, no tenderness.  LUNGS: Normal breath sounds bilaterally, no wheezing, rales,rhonchi or crepitation. No use of accessory muscles of respiration.  CARDIOVASCULAR: S1, S2 normal. No murmurs, rubs, or gallops.  ABDOMEN: Soft, non-tender, non-distended. Bowel sounds present. No organomegaly or mass.  EXTREMITIES: No pedal edema, cyanosis, or clubbing.  NEUROLOGIC: Cranial nerves II through XII are intact. Muscle strength 5/5 in all extremities. Sensation intact. Gait not checked.  PSYCHIATRIC: The patient is alert and oriented x 3.  SKIN:bilateral venous stasis changes and venous ulcers+ with mild erythema in right leg/thigh. Edema++  DATA REVIEW:   CBC  Recent Labs Lab 07/07/14 0704  WBC 5.2  HGB 7.4*  HCT 23.4*  PLT 86*    Chemistries   Recent Labs Lab 07/06/14 1808  07/09/14 0548  NA 123*  < > 127*  K 4.7  < > 4.2  CL 87*  < > 94*  CO2 28  < > 28  GLUCOSE 142*  < > 72  BUN 10  < > 18  CREATININE 0.71  < > 0.83  CALCIUM 8.2*  < > 7.6*  AST 43*  --   --   ALT 17  --   --   ALKPHOS 188*  --   --   BILITOT 0.8  --   --   < > = values in this interval not displayed.  Cardiac Enzymes No results for input(s): TROPONINI in the last 168 hours.  Microbiology Results   Recent Results (from the past 240 hour(s))  Culture, blood (routine x 2)     Status: None (Preliminary  result)   Collection Time: 07/06/14  6:08 PM  Result Value Ref Range Status   Specimen Description BLOOD  Final   Special Requests NONE  Final   Culture NO GROWTH 2 DAYS  Final   Report Status PENDING  Incomplete  Culture, blood (routine x 2)     Status: None (Preliminary result)   Collection Time: 07/06/14  7:00 PM  Result Value Ref Range Status   Specimen Description BLOOD  Final   Special Requests NONE  Final   Culture  Setup Time   Final    ANAEROBIC BOTTLE ONLY GRAM POSITIVE COCCI IN CHAINS CRITICAL RESULT CALLED TO, READ BACK BY AND VERIFIED WITH: BRENDA BECKER AT 44 BY JEF    Culture   Final    STREPTOCOCCUS GROUP G ANAEROBIC BOTTLE ONLY There is no known Penicillin Resistant Beta Streptococcus in the [place] For patients that are Penicillin-allergic, Erythromycin is 85-94% susceptible, and Clindamycin is 80% susceptible.  Contact Microbiology within 7 days if sensitivity testing is  required.   SUSCEPTIBILITIES TO FOLLOW    Report Status PENDING  Incomplete  Urine culture     Status: None   Collection Time: 07/06/14  7:00 PM  Result Value Ref Range Status   Specimen Description URINE, CLEAN CATCH  Final   Special Requests NONE  Final   Culture NO GROWTH 2 DAYS  Final   Report Status 07/08/2014 FINAL  Final    RADIOLOGY:  Dg Chest Port 1 View  07/07/2014   CLINICAL DATA:  Central line placement.  Initial encounter.  EXAM: PORTABLE CHEST - 1 VIEW  COMPARISON:  Chest radiograph performed 07/06/2014  FINDINGS: The patient's left PICC is noted ending about the proximal SVC.  Mildly increased interstitial markings could reflect a mild infectious process, given the patient's symptoms. No pleural effusion or pneumothorax is seen.  The cardiomediastinal silhouette is normal in size. No acute osseous abnormalities are identified. Cervical spinal fusion hardware is partially imaged.  IMPRESSION: 1. Left PICC noted ending about the proximal SVC. 2. Mildly increased interstitial markings  could reflect a mild infectious process, given the patient's symptoms.   Electronically Signed   By: Garald Balding M.D.   On: 07/07/2014 22:48    Management plans discussed with the patient and in agreement.  CODE STATUS:     Code Status Orders        Start     Ordered   07/07/14 0139  Full code   Continuous     07/07/14 0138    Advance Directive Documentation        Most Recent Value   Type of Advance Directive  Living will   Pre-existing out of facility DNR order (yellow form or pink MOST form)     "MOST" Form in Place?        TOTAL TIME TAKING CARE OF THIS PATIENT: 35 minutes.  D/w Dr Meyer Cory M.D on 07/09/2014 at 9:18 AM  Between 7am to 6pm - Pager - [phone] After 6pm go to [email or web address] - password [email or web address] [facility]  Office  [phone]  CC: Primary care physician; [facility], MD

## 2014-07-09 NOTE — Care Management Note (Signed)
Case Management Note  Patient Details  Name: Samantha Mcmillan MRN: [id number] Date of Birth: May 10, 1955  Subjective/Objective:                  Patient was resting in bed, comfortably. MD states patient will discharge to home today. Patient states she has a 24/7 caregiver which is her ex-husband [name]. She has applied for Medicaid. Her PCP is Dr. Rosario Jacks. She is open to [employer] [facility] home health. She has a rolling walker and rollator. She is on chronic O2 set up through [facility] but does not recall name of agency. She states her certification is about to expire for home O2.   Action/Plan: Notified Sharrie Rothman with [facility] of discharge to home today. Sharrie Rothman has access to Epic so this RNCM will not fax orders for resumption of home health unless she asks for them. Advised patient to notify Dr. Guerry Bruin office for oxygen assessment orders through [facility]- she agreed. No further RNCM needs. Case closed.   Expected Discharge Date:  07/09/14               Expected Discharge Plan:  [place]  In-House Referral:     Discharge planning Services  CM Consult  Post Acute Care Choice:  NA Choice offered to:  Patient  DME Arranged:    DME Agency:     HH Arranged:    [place]:     Status of Service:     Medicare Important Message Given:  Yes Date Medicare IM Given:  07/09/14 Medicare IM give by:  Marshell Garfinkel Date Additional Medicare IM Given:    Additional Medicare Important Message give by:     If discussed at [place] of Stay Meetings, dates discussed:    Additional Comments:  Marshell Garfinkel, RN 07/09/2014, 1:39 PM

## 2014-07-09 NOTE — Progress Notes (Signed)
Pt started on po ABX. Concerned that redness to bilateral legs has increased. There is now redness to upper leg and inner thigh.  Pt does not want to go home without feeling she is better and the cellulitis is being controlled with the medication.

## 2014-07-09 NOTE — Progress Notes (Signed)
Pt discharged with belongings

## 2014-07-09 NOTE — Progress Notes (Signed)
Pt for discharge picc line pulled, discharge instructions given to pt verbalizes understanding , discharged via wheelchair  Care of spouse

## 2014-07-11 LAB — CULTURE, BLOOD (ROUTINE X 2): CULTURE: NO GROWTH

## 2014-07-12 LAB — CULTURE, BLOOD (ROUTINE X 2)

## 2014-07-14 LAB — WOUND CULTURE: CULTURE: NO GROWTH

## 2014-07-25 ENCOUNTER — Ambulatory Visit (INDEPENDENT_AMBULATORY_CARE_PROVIDER_SITE_OTHER): Payer: Medicare Other | Admitting: Gastroenterology

## 2014-07-25 ENCOUNTER — Encounter: Payer: Self-pay | Admitting: Gastroenterology

## 2014-07-25 VITALS — BP 125/67 | HR 86 | Temp 97.9°F | Ht 64.0 in | Wt 184.0 lb

## 2014-07-25 DIAGNOSIS — R188 Other ascites: Secondary | ICD-10-CM

## 2014-07-25 DIAGNOSIS — K7460 Unspecified cirrhosis of liver: Secondary | ICD-10-CM | POA: Diagnosis not present

## 2014-07-25 MED ORDER — FOLIC ACID 1 MG PO TABS: 1.0000 mg | ORAL_TABLET | Freq: Every day | ORAL | Status: DC

## 2014-07-25 MED ORDER — SPIRONOLACTONE 25 MG PO TABS: 50.0000 mg | ORAL_TABLET | Freq: Every day | ORAL | Status: DC

## 2014-07-25 NOTE — Progress Notes (Signed)
Primary Care Physician: Casilda Carls, MD  Primary Gastroenterologist:  Dr. Lucilla Lame  Chief Complaint  Patient presents with  . Cirrhosis    HPI: Samantha Mcmillan is a 59 y.o. female here for for nonalcoholic cirrhosis. The patient has been state that she has been admitted to the hospital four times recently one of which was due to ascites and another for cellulitis. The patient reports that she has gained some weight recently and was seen by her primary care provider who suggested that she see me for adjustment of her medication due to her water retention.  Current Outpatient Prescriptions  Medication Sig Dispense Refill  . acetaminophen (TYLENOL) 500 MG tablet Take 1,000 mg by mouth every 6 (six) hours as needed for moderate pain or headache.    . albuterol (PROVENTIL HFA;VENTOLIN HFA) 108 (90 BASE) MCG/ACT inhaler Inhale 2 puffs into the lungs every 4 (four) hours as needed for wheezing or shortness of breath.    . ARIPiprazole (ABILIFY) 5 MG tablet Take 5 mg by mouth daily.     . budesonide-formoterol (SYMBICORT) 80-4.5 MCG/ACT inhaler Inhale 2 puffs into the lungs 2 (two) times daily as needed (shortness of breath).     . bumetanide (BUMEX) 1 MG tablet 1 tablet daily or as directed. (Patient taking differently: Take 1 mg by mouth daily. 1 tablet daily or as directed.) 30 tablet 0  . buprenorphine (BUTRANS - DOSED MCG/HR) 10 MCG/HR PTWK patch Place 1 patch (10 mcg total) onto the skin once a week. 4 patch 0  . cephALEXin (KEFLEX) 500 MG capsule Take 1 capsule (500 mg total) by mouth 2 (two) times daily. 20 capsule 0  . clonazePAM (KLONOPIN) 0.5 MG tablet Take 1 tablet (0.5 mg total) by mouth daily. (Patient taking differently: Take 0.5 mg by mouth at bedtime. ) 60 tablet 0  . cyclobenzaprine (FLEXERIL) 10 MG tablet Take 10 mg by mouth 3 (three) times daily as needed for muscle spasms.    . feeding supplement, GLUCERNA SHAKE, (GLUCERNA SHAKE) LIQD Take 237 mLs by mouth 3 (three)  times daily between meals.  0  . folic acid (FOLVITE) 1 MG tablet Take 1 tablet (1 mg total) by mouth daily.  5  . gabapentin (NEURONTIN) 300 MG capsule Take 300 mg by mouth at bedtime.    Marland Kitchen guaiFENesin (MUCINEX) 600 MG 12 hr tablet Take 600 mg by mouth 2 (two) times daily as needed for cough.     Marland Kitchen HYDROmorphone (DILAUDID) 2 MG tablet Take 2 mg by mouth every 6 (six) hours as needed for moderate pain or severe pain.    Marland Kitchen ipratropium-albuterol (DUONEB) 0.5-2.5 (3) MG/3ML SOLN Take 3 mLs by nebulization every 4 (four) hours as needed. And Q6H PRN    . lactulose (CHRONULAC) 10 GM/15ML solution TAKE 30 ML BY MOUTH 2 TIMES DAILY 480 mL 0  . lactulose, encephalopathy, (CHRONULAC) 10 GM/15ML SOLN Take 20 g by mouth 2 (two) times daily.     Marland Kitchen lamoTRIgine (LAMICTAL) 150 MG tablet Take 150 mg by mouth at bedtime.     Marland Kitchen levothyroxine (SYNTHROID, LEVOTHROID) 75 MCG tablet Take 75 mcg by mouth daily.     . magnesium oxide (MAG-OX) 400 MG tablet Take 400 mg by mouth 2 (two) times daily.    . metoCLOPramide (REGLAN) 5 MG tablet Take 5 mg by mouth 4 (four) times daily -  before meals and at bedtime.     . metoprolol succinate (TOPROL-XL) 25 MG 24 hr tablet Take  25 mg by mouth 2 (two) times daily.    . montelukast (SINGULAIR) 10 MG tablet Take 10 mg by mouth at bedtime.    . Multiple Vitamin (MULTIVITAMIN WITH MINERALS) TABS tablet Take 1 tablet by mouth daily.    . ondansetron (ZOFRAN) 4 MG tablet Take 4 mg by mouth 2 (two) times daily as needed for nausea or vomiting.    . pantoprazole (PROTONIX) 40 MG tablet Take 40 mg by mouth daily.    . polyethylene glycol (MIRALAX / GLYCOLAX) packet Take 17 g by mouth daily as needed for mild constipation.    . potassium chloride (K-DUR) 10 MEQ tablet Take 10 mEq by mouth at bedtime.    . Pramoxine-HC (HYDROCORTISONE ACE-PRAMOXINE) 2.5-1 % CREA Apply 1 application topically 2 (two) times daily. (Patient taking differently: Apply 1 application topically 2 (two) times daily  as needed. ) 1 Tube 0  . Saxagliptin-Metformin 2.06-998 MG TB24 Take 1 tablet by mouth 2 (two) times daily.    . sennosides-docusate sodium (SENOKOT-S) 8.6-50 MG tablet Take 2 tablets by mouth at bedtime.     . sertraline (ZOLOFT) 100 MG tablet Take 100 mg by mouth at bedtime.     Marland Kitchen spironolactone (ALDACTONE) 25 MG tablet Take 2 tablets (50 mg total) by mouth daily. 30 tablet 5  . thiamine 100 MG tablet Take 1 tablet (100 mg total) by mouth daily.    . Vitamin D, Ergocalciferol, (DRISDOL) 50000 UNITS CAPS capsule Take 50,000 Units by mouth every 7 (seven) days. Saturday    . allopurinol (ZYLOPRIM) 100 MG tablet Take 100 mg by mouth 2 (two) times daily.     . Amino Acids-Protein Hydrolys (FEEDING SUPPLEMENT, PRO-STAT SUGAR FREE 64,) LIQD Take 30 mLs by mouth daily.     Marland Kitchen atorvastatin (LIPITOR) 10 MG tablet Take 10 mg by mouth daily at 6 PM.     . lisinopril (PRINIVIL,ZESTRIL) 20 MG tablet Take 20 mg by mouth daily.     No current facility-administered medications for this visit.    Allergies as of 07/25/2014 - Review Complete 07/25/2014  Allergen Reaction Noted  . Iodine Shortness Of Breath 03/15/2014  . Latex Anaphylaxis 03/15/2014  . Oxycodone Shortness Of Breath 03/15/2014  . Percocet [oxycodone-acetaminophen] Shortness Of Breath 03/15/2014  . Shellfish allergy Anaphylaxis 03/15/2014  . Amitriptyline Other (See Comments) 03/15/2014  . Fish allergy Swelling and Other (See Comments) 06/25/2014  . Tape Other (See Comments) 06/25/2014  . Wellbutrin [bupropion] Other (See Comments) 03/15/2014  . Augmentin [amoxicillin-pot clavulanate] Rash 03/15/2014  . Codeine Rash 03/15/2014  . Cortisone Rash 03/15/2014  . Diphenhydramine Rash 03/15/2014  . Other Rash 03/15/2014  . Vicodin [hydrocodone-acetaminophen] Rash 03/15/2014    ROS:  General: Negative for anorexia, weight loss, fever, chills, fatigue, weakness. ENT: Negative for hoarseness, difficulty swallowing , nasal congestion. CV:  Negative for chest pain, angina, palpitations, dyspnea on exertion, peripheral edema.  Respiratory: Negative for dyspnea at rest, dyspnea on exertion, cough, sputum, wheezing.  GI: See history of present illness. GU:  Negative for dysuria, hematuria, urinary incontinence, urinary frequency, nocturnal urination.  Endo: Negative for unusual weight change.    Physical Examination:   BP 125/67 mmHg  Pulse 86  Temp(Src) 97.9 F (36.6 C) (Oral)  Ht 5\' 4"  (1.626 m)  Wt 184 lb (83.462 kg)  BMI 31.57 kg/m2  General: Well-nourished, well-developed in no acute distress.  Eyes: No icterus. Conjunctivae pink. Mouth: Oropharyngeal mucosa moist and pink , no lesions erythema or exudate. Extremities: The  patient has bilateral lower extremity edema. She also has ulcerations on her left leg in a scar on her right leg. Neuro: Alert and oriented x 3.  Grossly intact. Skin: Warm and dry, no jaundice.   Psych: Alert and cooperative, normal mood and affect.   Imaging Studies: [place] Extrem Low Bilat Comp  07/06/2014   CLINICAL DATA:  Bilateral lower extremity swelling and pain below the knee for 2 weeks.  EXAM: BILATERAL LOWER EXTREMITY VENOUS DOPPLER ULTRASOUND  TECHNIQUE: Gray-scale sonography with graded compression, as well as color Doppler and duplex ultrasound were performed to evaluate the lower extremity deep venous systems from the level of the common femoral vein and including the common femoral, femoral, profunda femoral, popliteal and calf veins including the posterior tibial, peroneal and gastrocnemius veins when visible. The superficial great saphenous vein was also interrogated. Spectral Doppler was utilized to evaluate flow at rest and with distal augmentation maneuvers in the common femoral, femoral and popliteal veins.  COMPARISON:  None.  FINDINGS: RIGHT LOWER EXTREMITY  Common Femoral Vein: No evidence of thrombus. Normal compressibility, respiratory phasicity and response to augmentation.   Saphenofemoral Junction: No evidence of thrombus. Normal compressibility and flow on color Doppler imaging.  Profunda Femoral Vein: No evidence of thrombus. Normal compressibility and flow on color Doppler imaging.  Femoral Vein: No evidence of thrombus. Normal compressibility, respiratory phasicity and response to augmentation.  Popliteal Vein: No evidence of thrombus. Normal compressibility, respiratory phasicity and response to augmentation.  Calf Veins: The posterior tibial vein and peroneal veins are not seen.  Other Findings:  None.  LEFT LOWER EXTREMITY  Common Femoral Vein: No evidence of thrombus. Normal compressibility, respiratory phasicity and response to augmentation.  Saphenofemoral Junction: No evidence of thrombus. Normal compressibility and flow on color Doppler imaging.  Profunda Femoral Vein: No evidence of thrombus. Normal compressibility and flow on color Doppler imaging.  Femoral Vein: No evidence of thrombus. Normal compressibility, respiratory phasicity and response to augmentation.  Popliteal Vein: No evidence of thrombus. Normal compressibility, respiratory phasicity and response to augmentation.  Calf Veins: The posterior tibial vein and peroneal veins are not seen.  Other Findings:  None.  IMPRESSION: No evidence of deep venous thrombosis.   Electronically Signed   By: Abelardo Diesel M.D.   On: 07/06/2014 22:09   Dg Chest Port 1 View  07/07/2014   CLINICAL DATA:  Central line placement.  Initial encounter.  EXAM: PORTABLE CHEST - 1 VIEW  COMPARISON:  Chest radiograph performed 07/06/2014  FINDINGS: The patient's left PICC is noted ending about the proximal SVC.  Mildly increased interstitial markings could reflect a mild infectious process, given the patient's symptoms. No pleural effusion or pneumothorax is seen.  The cardiomediastinal silhouette is normal in size. No acute osseous abnormalities are identified. Cervical spinal fusion hardware is partially imaged.  IMPRESSION: 1. Left  PICC noted ending about the proximal SVC. 2. Mildly increased interstitial markings could reflect a mild infectious process, given the patient's symptoms.   Electronically Signed   By: Garald Balding M.D.   On: 07/07/2014 22:48   Dg Chest Port 1 View  07/06/2014   CLINICAL DATA:  Cough, fever.  EXAM: PORTABLE CHEST - 1 VIEW  COMPARISON:  May 15, 2014.  FINDINGS: The heart size and mediastinal contours are within normal limits. Both lungs are clear. No pneumothorax or pleural effusion is noted. The visualized skeletal structures are unremarkable.  IMPRESSION: No acute cardiopulmonary abnormality seen.   Electronically Signed   By:  Marijo Conception, M.D.   On: 07/06/2014 20:14

## 2014-07-25 NOTE — Assessment & Plan Note (Signed)
This patient is a 59 year old woman who comes in with worsening ascites and a history of nonalcoholic liver disease. The patient has been evaluated at [facility] for a liver transplant. She has been in the hospital multiple times recently because of ascites and fluid retention. The patient was switched to Bumex and is presently on 25 mg of Aldactone. The patient will have her Aldactone increased to 50 mg per day and will contact me in one week's time if her fluid retention has not decreased.

## 2014-08-10 ENCOUNTER — Inpatient Hospital Stay: Payer: Medicare Other

## 2014-08-10 ENCOUNTER — Inpatient Hospital Stay: Payer: Medicare Other | Attending: Family Medicine | Admitting: Family Medicine

## 2014-08-10 VITALS — BP 134/79 | HR 77 | Temp 95.1°F | Wt 183.6 lb

## 2014-08-10 DIAGNOSIS — E780 Pure hypercholesterolemia: Secondary | ICD-10-CM | POA: Insufficient documentation

## 2014-08-10 DIAGNOSIS — K5790 Diverticulosis of intestine, part unspecified, without perforation or abscess without bleeding: Secondary | ICD-10-CM | POA: Diagnosis not present

## 2014-08-10 DIAGNOSIS — F329 Major depressive disorder, single episode, unspecified: Secondary | ICD-10-CM | POA: Insufficient documentation

## 2014-08-10 DIAGNOSIS — D509 Iron deficiency anemia, unspecified: Secondary | ICD-10-CM

## 2014-08-10 DIAGNOSIS — K589 Irritable bowel syndrome without diarrhea: Secondary | ICD-10-CM | POA: Insufficient documentation

## 2014-08-10 DIAGNOSIS — I5033 Acute on chronic diastolic (congestive) heart failure: Secondary | ICD-10-CM | POA: Diagnosis not present

## 2014-08-10 DIAGNOSIS — J45909 Unspecified asthma, uncomplicated: Secondary | ICD-10-CM | POA: Diagnosis not present

## 2014-08-10 DIAGNOSIS — K219 Gastro-esophageal reflux disease without esophagitis: Secondary | ICD-10-CM | POA: Diagnosis not present

## 2014-08-10 DIAGNOSIS — G8929 Other chronic pain: Secondary | ICD-10-CM | POA: Diagnosis not present

## 2014-08-10 DIAGNOSIS — M545 Low back pain: Secondary | ICD-10-CM | POA: Insufficient documentation

## 2014-08-10 DIAGNOSIS — Z79899 Other long term (current) drug therapy: Secondary | ICD-10-CM | POA: Insufficient documentation

## 2014-08-10 DIAGNOSIS — R5383 Other fatigue: Secondary | ICD-10-CM | POA: Insufficient documentation

## 2014-08-10 DIAGNOSIS — F4310 Post-traumatic stress disorder, unspecified: Secondary | ICD-10-CM | POA: Diagnosis not present

## 2014-08-10 DIAGNOSIS — E119 Type 2 diabetes mellitus without complications: Secondary | ICD-10-CM | POA: Insufficient documentation

## 2014-08-10 DIAGNOSIS — K7460 Unspecified cirrhosis of liver: Secondary | ICD-10-CM | POA: Diagnosis not present

## 2014-08-10 DIAGNOSIS — R531 Weakness: Secondary | ICD-10-CM

## 2014-08-10 DIAGNOSIS — D61818 Other pancytopenia: Secondary | ICD-10-CM

## 2014-08-10 DIAGNOSIS — G4730 Sleep apnea, unspecified: Secondary | ICD-10-CM | POA: Diagnosis not present

## 2014-08-10 DIAGNOSIS — R42 Dizziness and giddiness: Secondary | ICD-10-CM | POA: Diagnosis not present

## 2014-08-10 DIAGNOSIS — J449 Chronic obstructive pulmonary disease, unspecified: Secondary | ICD-10-CM | POA: Insufficient documentation

## 2014-08-10 DIAGNOSIS — F17210 Nicotine dependence, cigarettes, uncomplicated: Secondary | ICD-10-CM | POA: Insufficient documentation

## 2014-08-10 DIAGNOSIS — I10 Essential (primary) hypertension: Secondary | ICD-10-CM | POA: Diagnosis not present

## 2014-08-10 DIAGNOSIS — F449 Dissociative and conversion disorder, unspecified: Secondary | ICD-10-CM

## 2014-08-10 LAB — HEMOGLOBIN: HEMOGLOBIN: 8.4 g/dL — AB (ref 12.0–16.0)

## 2014-08-10 NOTE — Progress Notes (Signed)
[place]  Telephone:([phone]) [phone]  Fax:(336) [phone]     Samantha Mcmillan DOB: 1956-01-27  MR#: [id number]  EQA#:[id number]  Patient Care Team: Casilda Carls, MD as PCP - General (Internal Medicine) Casilda Carls, MD (Internal Medicine) Seeplaputhur Robinette Haines, MD (General Surgery)  CHIEF COMPLAINT:  Chief Complaint  Patient presents with  . Follow-up   Patient here for follow-up regarding iron deficiency anemia. She also has a history of duodenal AVM in 2009 seeing capsule endoscopy most recently had EGD and colonoscopy in 2012. History of diverticulosis and irritable bowel. Also noted to have cirrhosis on CT scan.  INTERVAL HISTORY:  Patient is here for continued evaluation regarding IDA. She has previously had multiple infusions of Feraheme for symptomatic anemia. Today she reports feeling very weak, fatigued with some intermittent dizziness. Otherwise she is in her normal state of health  REVIEW OF SYSTEMS:   Review of Systems  Constitutional: Positive for malaise/fatigue. Negative for fever, chills, weight loss and diaphoresis.  HENT: Negative for congestion, ear discharge, ear pain, hearing loss, nosebleeds, sore throat and tinnitus.   Eyes: Negative for blurred vision, double vision, photophobia, pain, discharge and redness.  Respiratory: Negative for cough, hemoptysis, sputum production, shortness of breath, wheezing and stridor.   Cardiovascular: Negative for chest pain, palpitations, orthopnea, claudication, leg swelling and PND.  Gastrointestinal: Negative for heartburn, nausea, vomiting, abdominal pain, diarrhea, constipation, blood in stool and melena.  Genitourinary: Negative.   Musculoskeletal: Negative.   Skin: Negative.   Neurological: Positive for dizziness and weakness. Negative for tingling, focal weakness, seizures and headaches.  Endo/Heme/Allergies: Does not bruise/bleed easily.  Psychiatric/Behavioral: Negative for depression. The patient is  not nervous/anxious and does not have insomnia.     As per HPI. Otherwise, a complete review of systems is negatve.  ONCOLOGY HISTORY:  No history exists.    PAST MEDICAL HISTORY: Past Medical History  Diagnosis Date  . GERD (gastroesophageal reflux disease)   . Colon polyp   . Cirrhosis of liver   . Hypertension   . Depression   . COPD (chronic obstructive pulmonary disease)   . IBS (irritable bowel syndrome)   . On home oxygen therapy     "2L; 24/7" (05/04/2014)  . Family history of adverse reaction to anesthesia     "my sister doesn't wake up good when she's put deep to sleep"  . High cholesterol   . Heart murmur   . Asthma   . Pneumonia "several times"  . Chronic bronchitis     "get it pretty much q yr"   . Sleep apnea     "can't tolerate CPAP" (05/04/2014)  . Type II diabetes mellitus   . History of blood transfusion     "related to my anemia"  . Anemia   . Iron deficiency anemia   . Headache     "more than a couple times/wk" (05/04/2014)  . Arthritis     "some in my feet" (05/04/2014)  . Chronic lower back pain   . Anxiety   . Acute on chronic diastolic CHF (congestive heart failure) 05/16/2014  . OCD (obsessive compulsive disorder)   . Panic attack   . PTSD (post-traumatic stress disorder)   . Chronic respiratory failure   . Cirrhosis of liver     PAST SURGICAL HISTORY: Past Surgical History  Procedure Laterality Date  . Cesarean section  1979  . Cataract extraction w/ intraocular lens  implant, bilateral Bilateral 2005  . Back surgery    .  Colonoscopy  2014  . Upper gastrointestinal endoscopy    . Posterior fusion cervical spine  2015    "rebuilt 3 of my neck vertebrae"  . Incision and drainage of wound Right 2009    "leg mauled  by dog"  . Tubal ligation  1979  . Dilation and curettage of uterus      FAMILY HISTORY Family History  Problem Relation Age of Onset  . Cancer Father     pancreatic    GYNECOLOGIC HISTORY:  No LMP recorded. Patient  is postmenopausal.     ADVANCED DIRECTIVES:    HEALTH MAINTENANCE: History  Substance Use Topics  . Smoking status: Current Every Day Smoker -- 0.50 packs/day for 48 years    Types: Cigarettes  . Smokeless tobacco: Never Used  . Alcohol Use: No     Colonoscopy:  PAP:  Bone density:  Lipid panel:  Allergies  Allergen Reactions  . Iodine Shortness Of Breath  . Latex Anaphylaxis  . Oxycodone Shortness Of Breath  . Percocet [Oxycodone-Acetaminophen] Shortness Of Breath  . Shellfish Allergy Anaphylaxis  . Amitriptyline Other (See Comments)    Mental changes  . Fish Allergy Swelling and Other (See Comments)    "respiratory distress and wheezing"  . Tape Other (See Comments)    Pulls skin off  . Wellbutrin [Bupropion] Other (See Comments)    Mental changes  . Augmentin [Amoxicillin-Pot Clavulanate] Rash  . Codeine Rash  . Cortisone Rash  . Diphenhydramine Rash  . Other Rash    darvocet  . Vicodin [Hydrocodone-Acetaminophen] Rash    dizzy    Current Outpatient Prescriptions  Medication Sig Dispense Refill  . acetaminophen (TYLENOL) 500 MG tablet Take 1,000 mg by mouth every 6 (six) hours as needed for moderate pain or headache.    . albuterol (PROVENTIL HFA;VENTOLIN HFA) 108 (90 BASE) MCG/ACT inhaler Inhale 2 puffs into the lungs every 4 (four) hours as needed for wheezing or shortness of breath.    . allopurinol (ZYLOPRIM) 100 MG tablet Take 100 mg by mouth 2 (two) times daily.     . Amino Acids-Protein Hydrolys (FEEDING SUPPLEMENT, PRO-STAT SUGAR FREE 64,) LIQD Take 30 mLs by mouth daily.     . ARIPiprazole (ABILIFY) 5 MG tablet Take 5 mg by mouth daily.     Marland Kitchen atorvastatin (LIPITOR) 10 MG tablet Take 10 mg by mouth daily at 6 PM.     . baclofen (LIORESAL) 10 MG tablet Take 10 mg by mouth 3 (three) times daily.    . budesonide-formoterol (SYMBICORT) 80-4.5 MCG/ACT inhaler Inhale 2 puffs into the lungs 2 (two) times daily as needed (shortness of breath).     .  bumetanide (BUMEX) 1 MG tablet 1 tablet daily or as directed. (Patient taking differently: Take 1 mg by mouth daily. 1 tablet daily or as directed.) 30 tablet 0  . Ergocalciferol (VITAMIN D2) 2000 UNITS TABS     . albuterol-ipratropium (COMBIVENT) 18-103 MCG/ACT inhaler Inhale into the lungs.    Marland Kitchen atorvastatin (LIPITOR) 10 MG tablet Take by mouth.    . buprenorphine (BUTRANS - DOSED MCG/HR) 10 MCG/HR PTWK patch Place 1 patch (10 mcg total) onto the skin once a week. 4 patch 0  . buprenorphine (BUTRANS) 10 MCG/HR PTWK patch Place onto the skin.    . cephALEXin (KEFLEX) 500 MG capsule Take 1 capsule (500 mg total) by mouth 2 (two) times daily. 20 capsule 0  . clonazePAM (KLONOPIN) 0.5 MG tablet Take 1 tablet (0.5 mg total)  by mouth daily. (Patient taking differently: Take 0.5 mg by mouth at bedtime. ) 60 tablet 0  . clonazePAM (KLONOPIN) 0.5 MG tablet Take by mouth.    . cyclobenzaprine (FLEXERIL) 10 MG tablet Take 10 mg by mouth 3 (three) times daily as needed for muscle spasms.    . feeding supplement, GLUCERNA SHAKE, (GLUCERNA SHAKE) LIQD Take 237 mLs by mouth 3 (three) times daily between meals.  0  . folic acid (FOLVITE) 1 MG tablet Take 1 tablet (1 mg total) by mouth daily.  5  . gabapentin (NEURONTIN) 300 MG capsule Take 300 mg by mouth at bedtime.    . gabapentin (NEURONTIN) 300 MG capsule Take by mouth.    Marland Kitchen glipiZIDE (GLUCOTROL XL) 10 MG 24 hr tablet Take by mouth.    Marland Kitchen guaiFENesin (MUCINEX) 600 MG 12 hr tablet Take 600 mg by mouth 2 (two) times daily as needed for cough.     Marland Kitchen HYDROmorphone (DILAUDID) 2 MG tablet Take 2 mg by mouth every 6 (six) hours as needed for moderate pain or severe pain.    Marland Kitchen ipratropium-albuterol (DUONEB) 0.5-2.5 (3) MG/3ML SOLN Take 3 mLs by nebulization every 4 (four) hours as needed. And Q6H PRN    . lactulose (CHRONULAC) 10 GM/15ML solution TAKE 30 ML BY MOUTH 2 TIMES DAILY 480 mL 0  . lactulose, encephalopathy, (CHRONULAC) 10 GM/15ML SOLN Take 20 g by mouth 2  (two) times daily.     Marland Kitchen lamoTRIgine (LAMICTAL) 150 MG tablet Take 150 mg by mouth at bedtime.     . lamoTRIgine (LAMICTAL) 150 MG tablet Take by mouth.    . levothyroxine (SYNTHROID, LEVOTHROID) 75 MCG tablet Take 75 mcg by mouth daily.     . Levothyroxine Sodium 75 MCG CAPS Take by mouth.    Marland Kitchen lisinopril (PRINIVIL,ZESTRIL) 10 MG tablet Take by mouth.    Marland Kitchen lisinopril (PRINIVIL,ZESTRIL) 20 MG tablet Take 20 mg by mouth daily.    . magnesium oxide (MAG-OX) 400 MG tablet Take 400 mg by mouth 2 (two) times daily.    . Magnesium Oxide, Antacid, 500 MG CAPS Take by mouth.    . metoCLOPramide (REGLAN) 5 MG tablet Take 5 mg by mouth 4 (four) times daily -  before meals and at bedtime.     . metoprolol succinate (TOPROL-XL) 25 MG 24 hr tablet Take 25 mg by mouth 2 (two) times daily.    . metoprolol tartrate (LOPRESSOR) 25 MG tablet Take by mouth.    . montelukast (SINGULAIR) 10 MG tablet Take 10 mg by mouth at bedtime.    . Multiple Vitamin (MULTIVITAMIN WITH MINERALS) TABS tablet Take 1 tablet by mouth daily.    . ondansetron (ZOFRAN) 4 MG tablet Take 4 mg by mouth 2 (two) times daily as needed for nausea or vomiting.    . pantoprazole (PROTONIX) 40 MG tablet Take 40 mg by mouth daily.    . pantoprazole (PROTONIX) 40 MG tablet Take by mouth.    . polyethylene glycol (MIRALAX / GLYCOLAX) packet Take 17 g by mouth daily as needed for mild constipation.    . potassium chloride (K-DUR) 10 MEQ tablet Take 10 mEq by mouth at bedtime.    . potassium chloride (K-DUR,KLOR-CON) 10 MEQ tablet Take by mouth.    . Pramoxine-HC (HYDROCORTISONE ACE-PRAMOXINE) 2.5-1 % CREA Apply 1 application topically 2 (two) times daily. (Patient taking differently: Apply 1 application topically 2 (two) times daily as needed. ) 1 Tube 0  . Saxagliptin-Metformin (KOMBIGLYZE XR)  2.06-998 MG TB24 Take by mouth.    . Saxagliptin-Metformin 2.06-998 MG TB24 Take 1 tablet by mouth 2 (two) times daily.    . sennosides-docusate sodium  (SENOKOT-S) 8.6-50 MG tablet Take 2 tablets by mouth at bedtime.     . sertraline (ZOLOFT) 100 MG tablet Take 100 mg by mouth at bedtime.     . sertraline (ZOLOFT) 100 MG tablet Take by mouth.    . spironolactone (ALDACTONE) 25 MG tablet Take 2 tablets (50 mg total) by mouth daily. 30 tablet 5  . thiamine 100 MG tablet Take 1 tablet (100 mg total) by mouth daily.    . Vitamin D, Ergocalciferol, (DRISDOL) 50000 UNITS CAPS capsule Take 50,000 Units by mouth every 7 (seven) days. Saturday     No current facility-administered medications for this visit.    OBJECTIVE: BP 134/79 mmHg  Pulse 77  Temp(Src) 95.1 F (35.1 C) (Tympanic)  Wt 183 lb 10.3 oz (83.3 kg)   Body mass index is 31.51 kg/(m^2).    ECOG FS:2 - Symptomatic, <50% confined to bed  General: Well-developed, well-nourished, no acute distress. Eyes: Pink conjunctiva, anicteric sclera. HEENT: Normocephalic, moist mucous membranes, clear oropharnyx. Lungs: Clear to auscultation bilaterally. Heart: Regular rate and rhythm. No rubs, murmurs, or gallops. Abdomen: Soft, nontender, nondistended. No organomegaly noted, normoactive bowel sounds. Musculoskeletal: No edema, cyanosis, or clubbing. Neuro: Alert, answering all questions appropriately. Cranial nerves grossly intact. Skin: No rashes or petechiae noted. Psych: Normal affect. Lymphatics: No cervical, calvicular, axillary or inguinal LAD.   LAB RESULTS:     Component Value Date/Time   NA 127* 07/09/2014 0548   NA 128* 05/31/2014   NA 136 03/15/2014 1336   K 4.2 07/09/2014 0548   K 3.1* 03/15/2014 1336   CL 94* 07/09/2014 0548   CL 98 03/15/2014 1336   CO2 28 07/09/2014 0548   CO2 29 03/15/2014 1336   GLUCOSE 72 07/09/2014 0548   GLUCOSE 57* 03/15/2014 1336   BUN 18 07/09/2014 0548   BUN 34* 05/31/2014   BUN 8 03/15/2014 1336   CREATININE 0.83 07/09/2014 0548   CREATININE 1.1 05/31/2014   CREATININE 1.05 03/15/2014 1336   CALCIUM 7.6* 07/09/2014 0548   CALCIUM  7.7* 03/15/2014 1336   PROT 6.2* 07/06/2014 1808   PROT 5.9* 03/15/2014 1336   PROT 5.7* 03/15/2014 1145   ALBUMIN 3.1* 07/06/2014 1808   ALBUMIN 2.1* 03/15/2014 1336   AST 43* 07/06/2014 1808   AST 40* 03/15/2014 1336   ALT 17 07/06/2014 1808   ALT 13* 03/15/2014 1336   ALKPHOS 188* 07/06/2014 1808   ALKPHOS 221* 03/15/2014 1336   BILITOT 0.8 07/06/2014 1808   GFRNONAA >60 07/09/2014 0548   GFRNONAA >60 03/06/2013 1855   GFRAA >60 07/09/2014 0548   GFRAA >60 03/06/2013 1855    No results found for: SPEP, UPEP  Lab Results  Component Value Date   WBC 5.2 07/07/2014   NEUTROABS 7.1* 07/06/2014   HGB 8.4* 08/10/2014   HCT 23.4* 07/07/2014   MCV 90.6 07/07/2014   PLT 86* 07/07/2014      Chemistry      Component Value Date/Time   NA 127* 07/09/2014 0548   NA 128* 05/31/2014   NA 136 03/15/2014 1336   K 4.2 07/09/2014 0548   K 3.1* 03/15/2014 1336   CL 94* 07/09/2014 0548   CL 98 03/15/2014 1336   CO2 28 07/09/2014 0548   CO2 29 03/15/2014 1336   BUN 18 07/09/2014 0548  BUN 34* 05/31/2014   BUN 8 03/15/2014 1336   CREATININE 0.83 07/09/2014 0548   CREATININE 1.1 05/31/2014   CREATININE 1.05 03/15/2014 1336   GLU 120 05/31/2014      Component Value Date/Time   CALCIUM 7.6* 07/09/2014 0548   CALCIUM 7.7* 03/15/2014 1336   ALKPHOS 188* 07/06/2014 1808   ALKPHOS 221* 03/15/2014 1336   AST 43* 07/06/2014 1808   AST 40* 03/15/2014 1336   ALT 17 07/06/2014 1808   ALT 13* 03/15/2014 1336   BILITOT 0.8 07/06/2014 1808       No results found for: LABCA2  No components found for: LABCA125  No results for input(s): INR in the last 168 hours.     Component Value Date/Time   COLORURINE YELLOW* 07/06/2014 1900   APPEARANCEUR CLEAR* 07/06/2014 1900   LABSPEC 1.010 07/06/2014 1900   PHURINE 9.0* 07/06/2014 1900   GLUCOSEU NEGATIVE 07/06/2014 1900   HGBUR NEGATIVE 07/06/2014 1900   BILIRUBINUR NEGATIVE 07/06/2014 1900   KETONESUR NEGATIVE 07/06/2014 1900    PROTEINUR 100* 07/06/2014 1900   UROBILINOGEN 0.2 05/15/2014 1608   NITRITE NEGATIVE 07/06/2014 1900   LEUKOCYTESUR TRACE* 07/06/2014 1900    STUDIES: No results found.  ASSESSMENT:  IDA Poor venous access  PLAN:   1. IDA. Patient most recently received Feraheme injection on 07/09/2014.she has been unable to receive subsequent doses due to poor venous access, she also reports having been taking oral iron regularly. Today hemoglobin is 8.4. As per previous plan with Dr. Inez Pilgrim goal was for hemoglobin above 11 g and ferritin above 50. If hemoglobin less than 11 and ferritin less than 50 Feraheme should be given but due to poor access she has been unable to receive such treatments on a regular basis and therefore was started on oral iron. Will reevaluate patient's hemoglobin again on Tuesday, and consider for blood or Feraheme. 2. Poor venous access. We'll refer to vascular for PICC line placement.  Patient expressed understanding and was in agreement with this plan. She also understands that She can call clinic at any time with any questions, concerns, or complaints.    Dr. Grayland Ormond was available for consultation and review of plan of care for this patient.   Evlyn Kanner, NP   08/10/2014 11:28 AM

## 2014-08-14 ENCOUNTER — Inpatient Hospital Stay: Payer: Medicare Other

## 2014-08-14 DIAGNOSIS — D61818 Other pancytopenia: Secondary | ICD-10-CM

## 2014-08-14 DIAGNOSIS — D509 Iron deficiency anemia, unspecified: Secondary | ICD-10-CM

## 2014-08-14 HISTORY — DX: Iron deficiency anemia, unspecified: D50.9

## 2014-08-14 LAB — CBC WITH DIFFERENTIAL/PLATELET
BASOS ABS: 0 10*3/uL (ref 0–0.1)
Basophils Relative: 1 %
EOS PCT: 1 %
Eosinophils Absolute: 0 10*3/uL (ref 0–0.7)
HEMATOCRIT: 28 % — AB (ref 35.0–47.0)
HEMOGLOBIN: 9.2 g/dL — AB (ref 12.0–16.0)
LYMPHS PCT: 15 %
Lymphs Abs: 0.5 10*3/uL — ABNORMAL LOW (ref 1.0–3.6)
MCH: 30.1 pg (ref 26.0–34.0)
MCHC: 32.8 g/dL (ref 32.0–36.0)
MCV: 91.8 fL (ref 80.0–100.0)
Monocytes Absolute: 0.1 10*3/uL — ABNORMAL LOW (ref 0.2–0.9)
Monocytes Relative: 4 %
Neutro Abs: 2.5 10*3/uL (ref 1.4–6.5)
Neutrophils Relative %: 79 %
Platelets: 116 10*3/uL — ABNORMAL LOW (ref 150–440)
RBC: 3.05 MIL/uL — AB (ref 3.80–5.20)
RDW: 18.9 % — ABNORMAL HIGH (ref 11.5–14.5)
WBC: 3.1 10*3/uL — ABNORMAL LOW (ref 3.6–11.0)

## 2014-08-14 LAB — SAMPLE TO BLOOD BANK

## 2014-08-15 ENCOUNTER — Telehealth: Payer: Self-pay | Admitting: *Deleted

## 2014-08-15 DIAGNOSIS — L97929 Non-pressure chronic ulcer of unspecified part of left lower leg with unspecified severity: Secondary | ICD-10-CM

## 2014-08-15 DIAGNOSIS — I87332 Chronic venous hypertension (idiopathic) with ulcer and inflammation of left lower extremity: Secondary | ICD-10-CM | POA: Insufficient documentation

## 2014-08-15 NOTE — Telephone Encounter (Signed)
Spoke with caregiver, [name], due to patient being unavailable.  Patient is to report to admitting desk at the [facility]  tomorrow @ 12:30 for PICC line placement.  Verbalized understanding

## 2014-08-16 ENCOUNTER — Ambulatory Visit
Admission: RE | Admit: 2014-08-16 | Discharge: 2014-08-16 | Disposition: A | Discharge status: Home / Self Care | Payer: Medicare Other | Source: Ambulatory Visit | Attending: Vascular Surgery | Admitting: Vascular Surgery

## 2014-08-16 ENCOUNTER — Encounter
Admission: RE | Disposition: A | Discharge status: Home / Self Care | Payer: Self-pay | Source: Ambulatory Visit | Attending: Vascular Surgery

## 2014-08-16 DIAGNOSIS — F4310 Post-traumatic stress disorder, unspecified: Secondary | ICD-10-CM | POA: Insufficient documentation

## 2014-08-16 DIAGNOSIS — G4730 Sleep apnea, unspecified: Secondary | ICD-10-CM | POA: Diagnosis not present

## 2014-08-16 DIAGNOSIS — Z9981 Dependence on supplemental oxygen: Secondary | ICD-10-CM | POA: Diagnosis not present

## 2014-08-16 DIAGNOSIS — K589 Irritable bowel syndrome without diarrhea: Secondary | ICD-10-CM | POA: Diagnosis not present

## 2014-08-16 DIAGNOSIS — Z91013 Allergy to seafood: Secondary | ICD-10-CM | POA: Diagnosis not present

## 2014-08-16 DIAGNOSIS — Z9842 Cataract extraction status, left eye: Secondary | ICD-10-CM | POA: Diagnosis not present

## 2014-08-16 DIAGNOSIS — G8929 Other chronic pain: Secondary | ICD-10-CM | POA: Insufficient documentation

## 2014-08-16 DIAGNOSIS — Z86010 Personal history of colonic polyps: Secondary | ICD-10-CM | POA: Diagnosis not present

## 2014-08-16 DIAGNOSIS — F419 Anxiety disorder, unspecified: Secondary | ICD-10-CM | POA: Insufficient documentation

## 2014-08-16 DIAGNOSIS — I5033 Acute on chronic diastolic (congestive) heart failure: Secondary | ICD-10-CM | POA: Insufficient documentation

## 2014-08-16 DIAGNOSIS — K7460 Unspecified cirrhosis of liver: Secondary | ICD-10-CM | POA: Diagnosis not present

## 2014-08-16 DIAGNOSIS — Z881 Allergy status to other antibiotic agents status: Secondary | ICD-10-CM | POA: Insufficient documentation

## 2014-08-16 DIAGNOSIS — Z800 Family history of malignant neoplasm of digestive organs: Secondary | ICD-10-CM | POA: Diagnosis not present

## 2014-08-16 DIAGNOSIS — Z91041 Radiographic dye allergy status: Secondary | ICD-10-CM | POA: Insufficient documentation

## 2014-08-16 DIAGNOSIS — Z888 Allergy status to other drugs, medicaments and biological substances status: Secondary | ICD-10-CM | POA: Diagnosis not present

## 2014-08-16 DIAGNOSIS — I10 Essential (primary) hypertension: Secondary | ICD-10-CM | POA: Diagnosis not present

## 2014-08-16 DIAGNOSIS — F17210 Nicotine dependence, cigarettes, uncomplicated: Secondary | ICD-10-CM | POA: Diagnosis not present

## 2014-08-16 DIAGNOSIS — D61818 Other pancytopenia: Secondary | ICD-10-CM | POA: Diagnosis present

## 2014-08-16 DIAGNOSIS — Z91048 Other nonmedicinal substance allergy status: Secondary | ICD-10-CM | POA: Insufficient documentation

## 2014-08-16 DIAGNOSIS — E119 Type 2 diabetes mellitus without complications: Secondary | ICD-10-CM | POA: Diagnosis not present

## 2014-08-16 DIAGNOSIS — E780 Pure hypercholesterolemia: Secondary | ICD-10-CM | POA: Diagnosis not present

## 2014-08-16 DIAGNOSIS — F329 Major depressive disorder, single episode, unspecified: Secondary | ICD-10-CM | POA: Diagnosis not present

## 2014-08-16 DIAGNOSIS — J42 Unspecified chronic bronchitis: Secondary | ICD-10-CM | POA: Diagnosis not present

## 2014-08-16 DIAGNOSIS — K219 Gastro-esophageal reflux disease without esophagitis: Secondary | ICD-10-CM | POA: Insufficient documentation

## 2014-08-16 DIAGNOSIS — F42 Obsessive-compulsive disorder: Secondary | ICD-10-CM | POA: Diagnosis not present

## 2014-08-16 DIAGNOSIS — Z79899 Other long term (current) drug therapy: Secondary | ICD-10-CM | POA: Diagnosis not present

## 2014-08-16 DIAGNOSIS — J449 Chronic obstructive pulmonary disease, unspecified: Secondary | ICD-10-CM | POA: Insufficient documentation

## 2014-08-16 DIAGNOSIS — Z7951 Long term (current) use of inhaled steroids: Secondary | ICD-10-CM | POA: Diagnosis not present

## 2014-08-16 DIAGNOSIS — Z9841 Cataract extraction status, right eye: Secondary | ICD-10-CM | POA: Diagnosis not present

## 2014-08-16 DIAGNOSIS — Z885 Allergy status to narcotic agent status: Secondary | ICD-10-CM | POA: Diagnosis not present

## 2014-08-16 DIAGNOSIS — J45909 Unspecified asthma, uncomplicated: Secondary | ICD-10-CM | POA: Diagnosis not present

## 2014-08-16 HISTORY — PX: PERIPHERAL VASCULAR CATHETERIZATION: SHX172C

## 2014-08-16 SURGERY — PICC LINE INSERTION
Anesthesia: Moderate Sedation

## 2014-08-16 MED ORDER — HEPARIN SOD (PORK) LOCK FLUSH 10 UNIT/ML IV SOLN
INTRAVENOUS | Status: AC
  Filled 2014-08-16: qty 2

## 2014-08-16 SURGICAL SUPPLY — 5 items
DRAPE BRACHIAL (DRAPES) ×3 IMPLANT
DRAPE TABLE BACK 80X90 (DRAPES) ×3 IMPLANT
KIT PICC 5FX65 DUAL (MISCELLANEOUS) ×3 IMPLANT
TOWEL OR 17X26 4PK STRL BLUE (TOWEL DISPOSABLE) ×3 IMPLANT
WIRE FLEX T 018X145 (WIRE) ×3 IMPLANT

## 2014-08-16 NOTE — Discharge Instructions (Signed)
PICC Insertion, Care After  Refer to this sheet in the next few weeks. These instructions provide you with information on caring for yourself after your procedure. Your health care provider may also give you more specific instructions. Your treatment has been planned according to current medical practices, but problems sometimes occur. Call your health care provider if you have any problems or questions after your procedure.  WHAT TO EXPECT AFTER THE PROCEDURE  After your procedure, it is typical to have the following:   Mild discomfort at the insertion site. This should not last more than a day.  HOME CARE INSTRUCTIONS   Rest at home for the remainder of the day after the procedure.   You may bend your arm and move it freely. If your PICC is near or at the bend of your elbow, avoid activity with repeated motion at the elbow.   Avoid lifting heavy objects as instructed by your health care provider.   Avoid using a crutch with the arm on the same side as your PICC. You may need to use a walker.  Bandage Care   Keep your PICC bandage (dressing) clean and dry to prevent infection.   Ask your health care provider when you may shower. To keep the dressing dry, cover the PICC with plastic wrap and tape before showering. If the dressing does become wet, replace it right after the shower.   Do not soak in the bath, swim, or use hot tubs when you have a PICC.   Change the PICC dressing as instructed by your health care provider.   Change your PICC dressing if it becomes loose or wet.  General PICC Care   Check the PICC insertion site daily for leakage, redness, swelling, or pain.   Flush the PICC as directed by your health care provider. Let your health care provider know right away if the PICC is difficult to flush or does not flush. Do not use force to flush the PICC.   Do not use a syringe that is less than 10 mL to flush the PICC.   Never pull or tug on the PICC.   Avoid blood pressure checks on the arm  with the PICC.   Keep your PICC identification card with you at all times.   Do not take the PICC out yourself. Only a trained health care professional should remove the PICC.  SEEK MEDICAL CARE IF:   You have pain in your arm, ear, face, or teeth.   You have fever or chills.   You have drainage from the PICC insertion site.   You have redness or palpate a "cord" around the PICC insertion site.   You cannot flush the catheter.  SEEK IMMEDIATE MEDICAL CARE IF:   You have swelling in the arm in which the PICC is inserted.  Document Released: 11/30/2012 Document Revised: 02/14/2013 Document Reviewed: 11/30/2012  [employer] Patient Information 2015 [employer]. This information is not intended to replace advice given to you by your health care provider. Make sure you discuss any questions you have with your health care provider.

## 2014-08-16 NOTE — H&P (Signed)
[facility] History & Physical Update  The patient was interviewed and re-examined.  The patient's previous History and Physical has been reviewed and is unchanged.  There is no change in the plan of care.  DEW,JASON, MD  08/16/2014, 12:26 PM

## 2014-08-17 ENCOUNTER — Encounter: Payer: Self-pay | Admitting: Vascular Surgery

## 2014-08-21 ENCOUNTER — Inpatient Hospital Stay: Payer: Medicare Other

## 2014-08-21 ENCOUNTER — Other Ambulatory Visit: Payer: Self-pay | Admitting: Family Medicine

## 2014-08-21 VITALS — BP 123/72 | HR 75 | Temp 95.4°F | Resp 16

## 2014-08-21 DIAGNOSIS — D61818 Other pancytopenia: Secondary | ICD-10-CM

## 2014-08-21 DIAGNOSIS — D508 Other iron deficiency anemias: Secondary | ICD-10-CM

## 2014-08-21 DIAGNOSIS — D509 Iron deficiency anemia, unspecified: Secondary | ICD-10-CM | POA: Diagnosis not present

## 2014-08-21 LAB — CBC WITH DIFFERENTIAL/PLATELET
BASOS ABS: 0 10*3/uL (ref 0–0.1)
Basophils Relative: 1 %
EOS ABS: 0 10*3/uL (ref 0–0.7)
Eosinophils Relative: 1 %
HCT: 23.5 % — ABNORMAL LOW (ref 35.0–47.0)
Hemoglobin: 7.7 g/dL — ABNORMAL LOW (ref 12.0–16.0)
Lymphocytes Relative: 17 %
Lymphs Abs: 0.5 10*3/uL — ABNORMAL LOW (ref 1.0–3.6)
MCH: 30.1 pg (ref 26.0–34.0)
MCHC: 32.6 g/dL (ref 32.0–36.0)
MCV: 92.3 fL (ref 80.0–100.0)
Monocytes Absolute: 0.1 10*3/uL — ABNORMAL LOW (ref 0.2–0.9)
Monocytes Relative: 4 %
NEUTROS PCT: 77 %
Neutro Abs: 2.1 10*3/uL (ref 1.4–6.5)
PLATELETS: 103 10*3/uL — AB (ref 150–440)
RBC: 2.55 MIL/uL — ABNORMAL LOW (ref 3.80–5.20)
RDW: 17.5 % — AB (ref 11.5–14.5)
WBC: 2.7 10*3/uL — AB (ref 3.6–11.0)

## 2014-08-21 LAB — IRON AND TIBC
Iron: 18 ug/dL — ABNORMAL LOW (ref 28–170)
Saturation Ratios: 7 % — ABNORMAL LOW (ref 10.4–31.8)
TIBC: 278 ug/dL (ref 250–450)
UIBC: 260 ug/dL

## 2014-08-21 LAB — PREPARE RBC (CROSSMATCH)

## 2014-08-21 LAB — ABO/RH: ABO/RH(D): A NEG

## 2014-08-21 LAB — FERRITIN: Ferritin: 69 ng/mL (ref 11–307)

## 2014-08-21 MED ORDER — HEPARIN SOD (PORK) LOCK FLUSH 100 UNIT/ML IV SOLN
INTRAVENOUS | Status: AC
  Filled 2014-08-21: qty 5

## 2014-08-21 MED ORDER — SODIUM CHLORIDE 0.9 % IV SOLN
250.0000 mL | Freq: Once | INTRAVENOUS | Status: AC
  Administered 2014-08-21: 250 mL via INTRAVENOUS
  Filled 2014-08-21: qty 250

## 2014-08-21 MED ORDER — ACETAMINOPHEN 325 MG PO TABS
650.0000 mg | ORAL_TABLET | Freq: Once | ORAL | Status: AC
  Administered 2014-08-21: 650 mg via ORAL
  Filled 2014-08-21: qty 2

## 2014-08-22 LAB — TYPE AND SCREEN
ABO/RH(D): A NEG
ANTIBODY SCREEN: NEGATIVE
Unit division: 0

## 2014-08-23 ENCOUNTER — Telehealth: Payer: Self-pay | Admitting: *Deleted

## 2014-08-23 NOTE — Telephone Encounter (Signed)
Called Lucina Mellow back at [facility]. Left voicemail to call back with fax number to fax PICC line orders to agency. Awaiting callback.

## 2014-08-23 NOTE — H&P (Signed)
[facility] Admission History & Physical  MRN : [id number]  Samantha Mcmillan is a 59 y.o. (1955/05/17) female who presents with chief complaint of No chief complaint on file. Marland Kitchen  History of Present Illness: Patient with an extensive past medical history including pancytopenia who has poor venous access and requires frequent IV therapy. We are asked to place a PICC line.  No current facility-administered medications for this encounter.   Current Outpatient Prescriptions  Medication Sig Dispense Refill  . acetaminophen (TYLENOL) 500 MG tablet Take 1,000 mg by mouth every 6 (six) hours as needed for moderate pain or headache.    . albuterol (PROVENTIL HFA;VENTOLIN HFA) 108 (90 BASE) MCG/ACT inhaler Inhale 2 puffs into the lungs every 4 (four) hours as needed for wheezing or shortness of breath.    Marland Kitchen albuterol-ipratropium (COMBIVENT) 18-103 MCG/ACT inhaler Inhale into the lungs.    Marland Kitchen allopurinol (ZYLOPRIM) 100 MG tablet Take 100 mg by mouth 2 (two) times daily.     . Amino Acids-Protein Hydrolys (FEEDING SUPPLEMENT, PRO-STAT SUGAR FREE 64,) LIQD Take 30 mLs by mouth daily.     . ARIPiprazole (ABILIFY) 5 MG tablet Take 5 mg by mouth daily.     Marland Kitchen atorvastatin (LIPITOR) 10 MG tablet Take 10 mg by mouth daily at 6 PM.     . atorvastatin (LIPITOR) 10 MG tablet Take by mouth.    . baclofen (LIORESAL) 10 MG tablet Take 10 mg by mouth 3 (three) times daily.    . budesonide-formoterol (SYMBICORT) 80-4.5 MCG/ACT inhaler Inhale 2 puffs into the lungs 2 (two) times daily as needed (shortness of breath).     . bumetanide (BUMEX) 1 MG tablet 1 tablet daily or as directed. (Patient taking differently: Take 1 mg by mouth daily. 1 tablet daily or as directed.) 30 tablet 0  . buprenorphine (BUTRANS - DOSED MCG/HR) 10 MCG/HR PTWK patch Place 1 patch (10 mcg total) onto the skin once a week. 4 patch 0  . buprenorphine (BUTRANS) 10 MCG/HR PTWK patch Place onto the skin.    . cephALEXin  (KEFLEX) 500 MG capsule Take 1 capsule (500 mg total) by mouth 2 (two) times daily. 20 capsule 0  . clonazePAM (KLONOPIN) 0.5 MG tablet Take 1 tablet (0.5 mg total) by mouth daily. (Patient taking differently: Take 0.5 mg by mouth at bedtime. ) 60 tablet 0  . clonazePAM (KLONOPIN) 0.5 MG tablet Take by mouth.    . cyclobenzaprine (FLEXERIL) 10 MG tablet Take 10 mg by mouth 3 (three) times daily as needed for muscle spasms.    . Ergocalciferol (VITAMIN D2) 2000 UNITS TABS     . feeding supplement, GLUCERNA SHAKE, (GLUCERNA SHAKE) LIQD Take 237 mLs by mouth 3 (three) times daily between meals.  0  . folic acid (FOLVITE) 1 MG tablet Take 1 tablet (1 mg total) by mouth daily.  5  . gabapentin (NEURONTIN) 300 MG capsule Take 300 mg by mouth at bedtime.    . gabapentin (NEURONTIN) 300 MG capsule Take by mouth.    Marland Kitchen glipiZIDE (GLUCOTROL XL) 10 MG 24 hr tablet Take by mouth.    Marland Kitchen guaiFENesin (MUCINEX) 600 MG 12 hr tablet Take 600 mg by mouth 2 (two) times daily as needed for cough.     Marland Kitchen HYDROmorphone (DILAUDID) 2 MG tablet Take 2 mg by mouth every 6 (six) hours as needed for moderate pain or severe pain.    Marland Kitchen ipratropium-albuterol (DUONEB) 0.5-2.5 (3) MG/3ML SOLN Take 3 mLs  by nebulization every 4 (four) hours as needed. And Q6H PRN    . lactulose (CHRONULAC) 10 GM/15ML solution TAKE 30 ML BY MOUTH 2 TIMES DAILY 480 mL 0  . lactulose, encephalopathy, (CHRONULAC) 10 GM/15ML SOLN Take 20 g by mouth 2 (two) times daily.     Marland Kitchen lamoTRIgine (LAMICTAL) 150 MG tablet Take 150 mg by mouth at bedtime.     . lamoTRIgine (LAMICTAL) 150 MG tablet Take by mouth.    . levothyroxine (SYNTHROID, LEVOTHROID) 75 MCG tablet Take 75 mcg by mouth daily.     . Levothyroxine Sodium 75 MCG CAPS Take by mouth.    Marland Kitchen lisinopril (PRINIVIL,ZESTRIL) 20 MG tablet Take 20 mg by mouth daily.    . magnesium oxide (MAG-OX) 400 MG tablet Take 400 mg by mouth 2 (two) times daily.    . Magnesium Oxide, Antacid, 500 MG CAPS Take by mouth.     . metoCLOPramide (REGLAN) 5 MG tablet Take 5 mg by mouth 4 (four) times daily -  before meals and at bedtime.     . metoprolol succinate (TOPROL-XL) 25 MG 24 hr tablet Take 25 mg by mouth 2 (two) times daily.    . montelukast (SINGULAIR) 10 MG tablet Take 10 mg by mouth at bedtime.    . Multiple Vitamin (MULTIVITAMIN WITH MINERALS) TABS tablet Take 1 tablet by mouth daily.    . ondansetron (ZOFRAN) 4 MG tablet Take 4 mg by mouth 2 (two) times daily as needed for nausea or vomiting.    . pantoprazole (PROTONIX) 40 MG tablet Take 40 mg by mouth daily.    . polyethylene glycol (MIRALAX / GLYCOLAX) packet Take 17 g by mouth daily as needed for mild constipation.    . potassium chloride (K-DUR) 10 MEQ tablet Take 10 mEq by mouth at bedtime.    . potassium chloride (K-DUR,KLOR-CON) 10 MEQ tablet Take by mouth.    . Pramoxine-HC (HYDROCORTISONE ACE-PRAMOXINE) 2.5-1 % CREA Apply 1 application topically 2 (two) times daily. (Patient taking differently: Apply 1 application topically 2 (two) times daily as needed. ) 1 Tube 0  . Saxagliptin-Metformin (KOMBIGLYZE XR) 2.06-998 MG TB24 Take by mouth.    . Saxagliptin-Metformin 2.06-998 MG TB24 Take 1 tablet by mouth 2 (two) times daily.    . sennosides-docusate sodium (SENOKOT-S) 8.6-50 MG tablet Take 2 tablets by mouth at bedtime.     . sertraline (ZOLOFT) 100 MG tablet Take 100 mg by mouth at bedtime.     Marland Kitchen spironolactone (ALDACTONE) 25 MG tablet Take 2 tablets (50 mg total) by mouth daily. 30 tablet 5  . thiamine 100 MG tablet Take 1 tablet (100 mg total) by mouth daily.    . Vitamin D, Ergocalciferol, (DRISDOL) 50000 UNITS CAPS capsule Take 50,000 Units by mouth every 7 (seven) days. Saturday    . lisinopril (PRINIVIL,ZESTRIL) 10 MG tablet Take by mouth.    . metoprolol tartrate (LOPRESSOR) 25 MG tablet Take by mouth.    . pantoprazole (PROTONIX) 40 MG tablet Take by mouth.    . sertraline (ZOLOFT) 100 MG tablet Take by mouth.      Past Medical History   Diagnosis Date  . GERD (gastroesophageal reflux disease)   . Colon polyp   . Cirrhosis of liver   . Hypertension   . Depression   . COPD (chronic obstructive pulmonary disease)   . IBS (irritable bowel syndrome)   . On home oxygen therapy     "2L; 24/7" (05/04/2014)  . Family history  of adverse reaction to anesthesia     "my sister doesn't wake up good when she's put deep to sleep"  . High cholesterol   . Heart murmur   . Asthma   . Pneumonia "several times"  . Chronic bronchitis     "get it pretty much q yr"   . Sleep apnea     "can't tolerate CPAP" (05/04/2014)  . Type II diabetes mellitus   . History of blood transfusion     "related to my anemia"  . Anemia   . Iron deficiency anemia   . Headache     "more than a couple times/wk" (05/04/2014)  . Arthritis     "some in my feet" (05/04/2014)  . Chronic lower back pain   . Anxiety   . Acute on chronic diastolic CHF (congestive heart failure) 05/16/2014  . OCD (obsessive compulsive disorder)   . Panic attack   . PTSD (post-traumatic stress disorder)   . Chronic respiratory failure   . Cirrhosis of liver   . IDA (iron deficiency anemia) 08/14/2014    Past Surgical History  Procedure Laterality Date  . Cesarean section  1979  . Cataract extraction w/ intraocular lens  implant, bilateral Bilateral 2005  . Back surgery    . Colonoscopy  2014  . Upper gastrointestinal endoscopy    . Posterior fusion cervical spine  2015    "rebuilt 3 of my neck vertebrae"  . Incision and drainage of wound Right 2009    "leg mauled  by dog"  . Tubal ligation  1979  . Dilation and curettage of uterus    . Peripheral vascular catheterization N/A 08/16/2014    Procedure: PICC Line Insertion;  Surgeon: Algernon Huxley, MD;  Location: [facility];  Service: Cardiovascular;  Laterality: N/A;    Social History History  Substance Use Topics  . Smoking status: Current Every Day Smoker -- 0.50 packs/day for 48 years    Types: Cigarettes   . Smokeless tobacco: Never Used  . Alcohol Use: No    Family History Family History  Problem Relation Age of Onset  . Cancer Father     pancreatic    Allergies  Allergen Reactions  . Iodine Shortness Of Breath  . Latex Anaphylaxis  . Oxycodone Shortness Of Breath  . Percocet [Oxycodone-Acetaminophen] Shortness Of Breath  . Shellfish Allergy Anaphylaxis  . Amitriptyline Other (See Comments)    Mental changes  . Fish Allergy Swelling and Other (See Comments)    "respiratory distress and wheezing"  . Tape Other (See Comments)    Pulls skin off  . Wellbutrin [Bupropion] Other (See Comments)    Mental changes  . Augmentin [Amoxicillin-Pot Clavulanate] Rash  . Codeine Rash  . Cortisone Rash  . Diphenhydramine Rash  . Other Rash    darvocet  . Vicodin [Hydrocodone-Acetaminophen] Rash    dizzy     REVIEW OF SYSTEMS (Negative unless checked)  Constitutional: [] Weight loss  [] Fever  [] Chills Cardiac: [] Chest pain   [] Chest pressure   [] Palpitations   [] Shortness of breath when laying flat   [] Shortness of breath at rest   [] Shortness of breath with exertion. Vascular:  [] Pain in legs with walking   [] Pain in legs at rest   [] Pain in legs when laying flat   [] Claudication   [] Pain in feet when walking  [] Pain in feet at rest  [] Pain in feet when laying flat   [] History of DVT   [] Phlebitis   [] Swelling  in legs   [] Varicose veins   [] Non-healing ulcers Pulmonary:   [] Uses home oxygen   [] Productive cough   [] Hemoptysis   [] Wheeze  [] COPD   [] Asthma Neurologic:  [] Dizziness  [] Blackouts   [] Seizures   [] History of stroke   [] History of TIA  [] Aphasia   [] Temporary blindness   [] Dysphagia   [] Weakness or numbness in arms   [] Weakness or numbness in legs Musculoskeletal:  [] Arthritis   [] Joint swelling   [] Joint pain   [] Low back pain Hematologic:  [] Easy bruising  [] Easy bleeding   [] Hypercoagulable state   [] Anemic  [] Hepatitis Gastrointestinal:  [] Blood in stool   [] Vomiting blood   [] Gastroesophageal reflux/heartburn   [] Difficulty swallowing. Genitourinary:  [] Chronic kidney disease   [] Difficult urination  [] Frequent urination  [] Burning with urination   [] Blood in urine Skin:  [] Rashes   [] Ulcers   [] Wounds Psychological:  [] History of anxiety   []  History of major depression.  Physical Examination  Filed Vitals:   08/16/14 1230 08/16/14 1445  BP: 115/68 126/69  Pulse: 79   Resp: 20   SpO2: 100%    There is no weight on file to calculate BMI.  Head: [place]/AT, No temporalis wasting. Prominent temp pulse not noted. Ear/Nose/Throat: Hearing grossly intact, nares w/o erythema or drainage, oropharynx w/o Erythema/Exudate,  Eyes: PERRLA, EOMI.  Neck: Supple, no nuchal rigidity.  No bruit or JVD.  Pulmonary:  Good air movement, no use of accessory muscles.  Cardiac: RRR, normal S1, S2       CBC Lab Results  Component Value Date   WBC 2.7* 08/21/2014   HGB 7.7* 08/21/2014   HCT 23.5* 08/21/2014   MCV 92.3 08/21/2014   PLT 103* 08/21/2014    BMET    Component Value Date/Time   NA 127* 07/09/2014 0548   NA 128* 05/31/2014   NA 136 03/15/2014 1336   K 4.2 07/09/2014 0548   K 3.1* 03/15/2014 1336   CL 94* 07/09/2014 0548   CL 98 03/15/2014 1336   CO2 28 07/09/2014 0548   CO2 29 03/15/2014 1336   GLUCOSE 72 07/09/2014 0548   GLUCOSE 57* 03/15/2014 1336   BUN 18 07/09/2014 0548   BUN 34* 05/31/2014   BUN 8 03/15/2014 1336   CREATININE 0.83 07/09/2014 0548   CREATININE 1.1 05/31/2014   CREATININE 1.05 03/15/2014 1336   CALCIUM 7.6* 07/09/2014 0548   CALCIUM 7.7* 03/15/2014 1336   GFRNONAA >60 07/09/2014 0548   GFRNONAA >60 03/06/2013 1855   GFRAA >60 07/09/2014 0548   GFRAA >60 03/06/2013 1855   CrCl cannot be calculated (Patient has no serum creatinine result on file.).  COAG Lab Results  Component Value Date   INR 1.0 03/15/2014   INR 1.0 08/10/2012      Assessment/Plan Pancytopenia and poor venous access. We will place a PICC  line today at the request of her primary care physician. Risks and benefits discussed.   Mccall Will, MD  08/23/2014 8:15 AM

## 2014-08-24 NOTE — Telephone Encounter (Signed)
Fax number for sending orders is [phone]

## 2014-08-24 NOTE — Telephone Encounter (Signed)
Have not received return call from [facility] agency at this time.

## 2014-08-26 ENCOUNTER — Encounter: Payer: Self-pay | Admitting: *Deleted

## 2014-08-26 ENCOUNTER — Emergency Department: Payer: Medicare Other

## 2014-08-26 ENCOUNTER — Observation Stay
Admission: EM | Admit: 2014-08-26 | Discharge: 2014-08-29 | Disposition: A | Discharge status: Home Health | Payer: Medicare Other | Attending: Internal Medicine | Admitting: Internal Medicine

## 2014-08-26 DIAGNOSIS — D509 Iron deficiency anemia, unspecified: Secondary | ICD-10-CM | POA: Diagnosis not present

## 2014-08-26 DIAGNOSIS — G8929 Other chronic pain: Secondary | ICD-10-CM | POA: Insufficient documentation

## 2014-08-26 DIAGNOSIS — D61818 Other pancytopenia: Secondary | ICD-10-CM | POA: Diagnosis not present

## 2014-08-26 DIAGNOSIS — F42 Obsessive-compulsive disorder: Secondary | ICD-10-CM | POA: Insufficient documentation

## 2014-08-26 DIAGNOSIS — J45909 Unspecified asthma, uncomplicated: Secondary | ICD-10-CM | POA: Diagnosis not present

## 2014-08-26 DIAGNOSIS — J449 Chronic obstructive pulmonary disease, unspecified: Secondary | ICD-10-CM | POA: Insufficient documentation

## 2014-08-26 DIAGNOSIS — F419 Anxiety disorder, unspecified: Secondary | ICD-10-CM | POA: Insufficient documentation

## 2014-08-26 DIAGNOSIS — Z79899 Other long term (current) drug therapy: Secondary | ICD-10-CM | POA: Diagnosis not present

## 2014-08-26 DIAGNOSIS — Z86010 Personal history of colonic polyps: Secondary | ICD-10-CM | POA: Diagnosis not present

## 2014-08-26 DIAGNOSIS — Z9981 Dependence on supplemental oxygen: Secondary | ICD-10-CM | POA: Insufficient documentation

## 2014-08-26 DIAGNOSIS — F329 Major depressive disorder, single episode, unspecified: Secondary | ICD-10-CM | POA: Diagnosis not present

## 2014-08-26 DIAGNOSIS — Z79891 Long term (current) use of opiate analgesic: Secondary | ICD-10-CM | POA: Diagnosis not present

## 2014-08-26 DIAGNOSIS — I10 Essential (primary) hypertension: Secondary | ICD-10-CM | POA: Diagnosis not present

## 2014-08-26 DIAGNOSIS — K7460 Unspecified cirrhosis of liver: Secondary | ICD-10-CM | POA: Diagnosis not present

## 2014-08-26 DIAGNOSIS — E780 Pure hypercholesterolemia: Secondary | ICD-10-CM | POA: Diagnosis not present

## 2014-08-26 DIAGNOSIS — F4310 Post-traumatic stress disorder, unspecified: Secondary | ICD-10-CM | POA: Insufficient documentation

## 2014-08-26 DIAGNOSIS — I5033 Acute on chronic diastolic (congestive) heart failure: Principal | ICD-10-CM | POA: Diagnosis present

## 2014-08-26 DIAGNOSIS — M549 Dorsalgia, unspecified: Secondary | ICD-10-CM | POA: Insufficient documentation

## 2014-08-26 DIAGNOSIS — E871 Hypo-osmolality and hyponatremia: Secondary | ICD-10-CM | POA: Insufficient documentation

## 2014-08-26 DIAGNOSIS — E8771 Transfusion associated circulatory overload: Secondary | ICD-10-CM

## 2014-08-26 DIAGNOSIS — K649 Unspecified hemorrhoids: Secondary | ICD-10-CM | POA: Diagnosis not present

## 2014-08-26 DIAGNOSIS — E039 Hypothyroidism, unspecified: Secondary | ICD-10-CM | POA: Diagnosis not present

## 2014-08-26 DIAGNOSIS — K219 Gastro-esophageal reflux disease without esophagitis: Secondary | ICD-10-CM | POA: Insufficient documentation

## 2014-08-26 DIAGNOSIS — I509 Heart failure, unspecified: Secondary | ICD-10-CM

## 2014-08-26 DIAGNOSIS — F17210 Nicotine dependence, cigarettes, uncomplicated: Secondary | ICD-10-CM | POA: Diagnosis not present

## 2014-08-26 DIAGNOSIS — J9610 Chronic respiratory failure, unspecified whether with hypoxia or hypercapnia: Secondary | ICD-10-CM | POA: Diagnosis not present

## 2014-08-26 DIAGNOSIS — E119 Type 2 diabetes mellitus without complications: Secondary | ICD-10-CM | POA: Diagnosis not present

## 2014-08-26 DIAGNOSIS — G4730 Sleep apnea, unspecified: Secondary | ICD-10-CM | POA: Diagnosis not present

## 2014-08-26 LAB — CBC
HCT: 28.5 % — ABNORMAL LOW (ref 35.0–47.0)
Hemoglobin: 9.2 g/dL — ABNORMAL LOW (ref 12.0–16.0)
MCH: 30.1 pg (ref 26.0–34.0)
MCHC: 32.2 g/dL (ref 32.0–36.0)
MCV: 93.6 fL (ref 80.0–100.0)
Platelets: 98 10*3/uL — ABNORMAL LOW (ref 150–440)
RBC: 3.05 MIL/uL — ABNORMAL LOW (ref 3.80–5.20)
RDW: 17.4 % — AB (ref 11.5–14.5)
WBC: 3.3 10*3/uL — AB (ref 3.6–11.0)

## 2014-08-26 LAB — TROPONIN I: Troponin I: <0.03 ng/mL (ref <0.031)

## 2014-08-26 LAB — HEPATIC FUNCTION PANEL
ALT: 29 U/L (ref 14–54)
AST: 65 U/L — ABNORMAL HIGH (ref 15–41)
Albumin: 3.1 g/dL — ABNORMAL LOW (ref 3.5–5.0)
Alkaline Phosphatase: 289 U/L — ABNORMAL HIGH (ref 38–126)
BILIRUBIN DIRECT: 0.2 mg/dL (ref 0.1–0.5)
Indirect Bilirubin: 0.1 mg/dL — ABNORMAL LOW (ref 0.3–0.9)
Total Bilirubin: 0.3 mg/dL (ref 0.3–1.2)
Total Protein: 6.5 g/dL (ref 6.5–8.1)

## 2014-08-26 LAB — BASIC METABOLIC PANEL
Anion gap: 9 (ref 5–15)
BUN: 13 mg/dL (ref 6–20)
CO2: 23 mmol/L (ref 22–32)
Calcium: 8 mg/dL — ABNORMAL LOW (ref 8.9–10.3)
Chloride: 91 mmol/L — ABNORMAL LOW (ref 101–111)
Creatinine, Ser: 0.97 mg/dL (ref 0.44–1.00)
GFR calc Af Amer: >60 mL/min (ref >60)
GFR calc non Af Amer: >60 mL/min (ref >60)
Glucose, Bld: 167 mg/dL — ABNORMAL HIGH (ref 65–99)
POTASSIUM: 4.8 mmol/L (ref 3.5–5.1)
Sodium: 123 mmol/L — ABNORMAL LOW (ref 135–145)

## 2014-08-26 LAB — GLUCOSE, CAPILLARY: GLUCOSE-CAPILLARY: 127 mg/dL — AB (ref 65–99)

## 2014-08-26 LAB — BRAIN NATRIURETIC PEPTIDE: B NATRIURETIC PEPTIDE 5: 90 pg/mL (ref 0.0–100.0)

## 2014-08-26 MED ORDER — POLYETHYLENE GLYCOL 3350 17 G PO PACK: 17.0000 g | PACK | Freq: Every day | ORAL | Status: DC | PRN

## 2014-08-26 MED ORDER — IPRATROPIUM-ALBUTEROL 0.5-2.5 (3) MG/3ML IN SOLN
3.0000 mL | Freq: Once | RESPIRATORY_TRACT | Status: AC
  Administered 2014-08-26: 3 mL via RESPIRATORY_TRACT

## 2014-08-26 MED ORDER — PANTOPRAZOLE SODIUM 40 MG PO TBEC
40.0000 mg | DELAYED_RELEASE_TABLET | Freq: Every day | ORAL | Status: DC
  Administered 2014-08-27 – 2014-08-29 (×3): 40 mg via ORAL
  Filled 2014-08-26 (×3): qty 1

## 2014-08-26 MED ORDER — MONTELUKAST SODIUM 10 MG PO TABS
10.0000 mg | ORAL_TABLET | Freq: Every day | ORAL | Status: DC
  Administered 2014-08-27 – 2014-08-28 (×3): 10 mg via ORAL
  Filled 2014-08-26 (×3): qty 1

## 2014-08-26 MED ORDER — METOPROLOL SUCCINATE ER 25 MG PO TB24
25.0000 mg | ORAL_TABLET | Freq: Two times a day (BID) | ORAL | Status: DC
  Administered 2014-08-28 (×2): 25 mg via ORAL
  Filled 2014-08-26 (×2): qty 1

## 2014-08-26 MED ORDER — FOLIC ACID 1 MG PO TABS
1.0000 mg | ORAL_TABLET | Freq: Every day | ORAL | Status: DC
  Administered 2014-08-27 – 2014-08-29 (×3): 1 mg via ORAL
  Filled 2014-08-26 (×3): qty 1

## 2014-08-26 MED ORDER — SERTRALINE HCL 50 MG PO TABS
100.0000 mg | ORAL_TABLET | Freq: Every day | ORAL | Status: DC
  Administered 2014-08-27 – 2014-08-28 (×3): 100 mg via ORAL
  Filled 2014-08-26 (×4): qty 2

## 2014-08-26 MED ORDER — LACTULOSE 10 GM/15ML PO SOLN
10.0000 g | Freq: Two times a day (BID) | ORAL | Status: DC
  Administered 2014-08-27 – 2014-08-29 (×6): 10 g via ORAL
  Filled 2014-08-26 (×6): qty 30

## 2014-08-26 MED ORDER — SPIRONOLACTONE 25 MG PO TABS
50.0000 mg | ORAL_TABLET | Freq: Every day | ORAL | Status: DC
  Administered 2014-08-27 – 2014-08-29 (×3): 50 mg via ORAL
  Filled 2014-08-26 (×3): qty 2

## 2014-08-26 MED ORDER — SENNOSIDES-DOCUSATE SODIUM 8.6-50 MG PO TABS
2.0000 | ORAL_TABLET | Freq: Every day | ORAL | Status: DC
  Administered 2014-08-27 – 2014-08-28 (×2): 2 via ORAL
  Filled 2014-08-26 (×4): qty 2

## 2014-08-26 MED ORDER — ATORVASTATIN CALCIUM 10 MG PO TABS
10.0000 mg | ORAL_TABLET | Freq: Every day | ORAL | Status: DC
  Administered 2014-08-27 – 2014-08-28 (×2): 10 mg via ORAL
  Filled 2014-08-26 (×2): qty 1

## 2014-08-26 MED ORDER — VITAMIN B-1 100 MG PO TABS
100.0000 mg | ORAL_TABLET | Freq: Every day | ORAL | Status: DC
  Administered 2014-08-27 – 2014-08-29 (×3): 100 mg via ORAL
  Filled 2014-08-26 (×3): qty 1

## 2014-08-26 MED ORDER — IPRATROPIUM-ALBUTEROL 0.5-2.5 (3) MG/3ML IN SOLN
3.0000 mL | Freq: Four times a day (QID) | RESPIRATORY_TRACT | Status: DC
  Administered 2014-08-27 – 2014-08-29 (×8): 3 mL via RESPIRATORY_TRACT
  Filled 2014-08-26 (×9): qty 3

## 2014-08-26 MED ORDER — BACLOFEN 10 MG PO TABS
10.0000 mg | ORAL_TABLET | Freq: Three times a day (TID) | ORAL | Status: DC
  Administered 2014-08-27 – 2014-08-29 (×8): 10 mg via ORAL
  Filled 2014-08-26 (×8): qty 1

## 2014-08-26 MED ORDER — ADULT MULTIVITAMIN W/MINERALS CH
1.0000 | ORAL_TABLET | Freq: Every day | ORAL | Status: DC
  Administered 2014-08-27 – 2014-08-29 (×3): 1 via ORAL
  Filled 2014-08-26 (×3): qty 1

## 2014-08-26 MED ORDER — GUAIFENESIN ER 600 MG PO TB12: 600.0000 mg | ORAL_TABLET | Freq: Two times a day (BID) | ORAL | Status: DC | PRN

## 2014-08-26 MED ORDER — CYCLOBENZAPRINE HCL 10 MG PO TABS: 10.0000 mg | ORAL_TABLET | Freq: Three times a day (TID) | ORAL | Status: DC | PRN

## 2014-08-26 MED ORDER — CLONAZEPAM 0.5 MG PO TABS
0.5000 mg | ORAL_TABLET | Freq: Every day | ORAL | Status: DC
  Administered 2014-08-27 – 2014-08-28 (×3): 0.5 mg via ORAL
  Filled 2014-08-26 (×3): qty 1

## 2014-08-26 MED ORDER — LEVOTHYROXINE SODIUM 75 MCG PO TABS
75.0000 ug | ORAL_TABLET | Freq: Every day | ORAL | Status: DC
  Administered 2014-08-27 – 2014-08-29 (×3): 75 ug via ORAL
  Filled 2014-08-26 (×3): qty 1

## 2014-08-26 MED ORDER — LISINOPRIL 20 MG PO TABS: 20.0000 mg | ORAL_TABLET | Freq: Every day | ORAL | Status: DC

## 2014-08-26 MED ORDER — FUROSEMIDE 10 MG/ML IJ SOLN
INTRAMUSCULAR | Status: AC
  Administered 2014-08-26: 60 mg via INTRAVENOUS
  Filled 2014-08-26: qty 10

## 2014-08-26 MED ORDER — GABAPENTIN 300 MG PO CAPS
300.0000 mg | ORAL_CAPSULE | Freq: Every day | ORAL | Status: DC
  Administered 2014-08-27 – 2014-08-28 (×3): 300 mg via ORAL
  Filled 2014-08-26 (×3): qty 1

## 2014-08-26 MED ORDER — INSULIN ASPART 100 UNIT/ML ~~LOC~~ SOLN: 0.0000 [IU] | Freq: Every day | SUBCUTANEOUS | Status: DC

## 2014-08-26 MED ORDER — INSULIN ASPART 100 UNIT/ML ~~LOC~~ SOLN
0.0000 [IU] | Freq: Three times a day (TID) | SUBCUTANEOUS | Status: DC
  Administered 2014-08-27: 3 [IU] via SUBCUTANEOUS
  Administered 2014-08-28: 2 [IU] via SUBCUTANEOUS
  Filled 2014-08-26: qty 3
  Filled 2014-08-26: qty 2

## 2014-08-26 MED ORDER — SAXAGLIPTIN-METFORMIN ER 2.5-1000 MG PO TB24: 1.0000 | ORAL_TABLET | Freq: Two times a day (BID) | ORAL | Status: DC

## 2014-08-26 MED ORDER — FUROSEMIDE 10 MG/ML IJ SOLN
40.0000 mg | Freq: Every day | INTRAMUSCULAR | Status: DC
  Administered 2014-08-27: 40 mg via INTRAVENOUS
  Filled 2014-08-26: qty 4

## 2014-08-26 MED ORDER — ALLOPURINOL 100 MG PO TABS
100.0000 mg | ORAL_TABLET | Freq: Two times a day (BID) | ORAL | Status: DC
  Administered 2014-08-27 – 2014-08-29 (×6): 100 mg via ORAL
  Filled 2014-08-26 (×6): qty 1

## 2014-08-26 MED ORDER — ONDANSETRON HCL 4 MG PO TABS: 4.0000 mg | ORAL_TABLET | Freq: Four times a day (QID) | ORAL | Status: DC | PRN

## 2014-08-26 MED ORDER — LAMOTRIGINE 100 MG PO TABS
150.0000 mg | ORAL_TABLET | Freq: Every day | ORAL | Status: DC
  Administered 2014-08-27 – 2014-08-28 (×3): 150 mg via ORAL
  Filled 2014-08-26: qty 2
  Filled 2014-08-26 (×4): qty 1.5

## 2014-08-26 MED ORDER — ONDANSETRON HCL 4 MG/2ML IJ SOLN: 4.0000 mg | Freq: Four times a day (QID) | INTRAMUSCULAR | Status: DC | PRN

## 2014-08-26 MED ORDER — HEPARIN SODIUM (PORCINE) 5000 UNIT/ML IJ SOLN
5000.0000 [IU] | Freq: Three times a day (TID) | INTRAMUSCULAR | Status: DC
  Administered 2014-08-27 – 2014-08-28 (×5): 5000 [IU] via SUBCUTANEOUS
  Filled 2014-08-26 (×4): qty 1

## 2014-08-26 MED ORDER — ARIPIPRAZOLE 5 MG PO TABS
5.0000 mg | ORAL_TABLET | Freq: Every day | ORAL | Status: DC
  Administered 2014-08-27 – 2014-08-28 (×2): 5 mg via ORAL
  Filled 2014-08-26 (×4): qty 1

## 2014-08-26 MED ORDER — IPRATROPIUM-ALBUTEROL 0.5-2.5 (3) MG/3ML IN SOLN
RESPIRATORY_TRACT | Status: AC
  Administered 2014-08-26: 3 mL via RESPIRATORY_TRACT
  Filled 2014-08-26: qty 3

## 2014-08-26 MED ORDER — METOCLOPRAMIDE HCL 5 MG PO TABS
5.0000 mg | ORAL_TABLET | Freq: Three times a day (TID) | ORAL | Status: DC
  Administered 2014-08-27 – 2014-08-29 (×9): 5 mg via ORAL
  Filled 2014-08-26 (×9): qty 1

## 2014-08-26 MED ORDER — SAXAGLIPTIN HCL 2.5 MG PO TABS
2.5000 mg | ORAL_TABLET | Freq: Two times a day (BID) | ORAL | Status: DC
  Administered 2014-08-27 – 2014-08-29 (×5): 2.5 mg via ORAL
  Filled 2014-08-26 (×6): qty 1

## 2014-08-26 MED ORDER — METFORMIN HCL ER 500 MG PO TB24
1000.0000 mg | ORAL_TABLET | Freq: Two times a day (BID) | ORAL | Status: DC
  Administered 2014-08-27 – 2014-08-29 (×5): 1000 mg via ORAL
  Filled 2014-08-26 (×5): qty 2

## 2014-08-26 MED ORDER — BUDESONIDE-FORMOTEROL FUMARATE 80-4.5 MCG/ACT IN AERO
2.0000 | INHALATION_SPRAY | Freq: Two times a day (BID) | RESPIRATORY_TRACT | Status: DC | PRN
  Filled 2014-08-26: qty 6.9

## 2014-08-26 MED ORDER — FUROSEMIDE 10 MG/ML IJ SOLN
60.0000 mg | INTRAMUSCULAR | Status: AC
  Administered 2014-08-26: 60 mg via INTRAVENOUS

## 2014-08-26 MED ORDER — MAGNESIUM OXIDE 400 (241.3 MG) MG PO TABS
400.0000 mg | ORAL_TABLET | Freq: Two times a day (BID) | ORAL | Status: DC
  Administered 2014-08-27 – 2014-08-29 (×6): 400 mg via ORAL
  Filled 2014-08-26 (×6): qty 1

## 2014-08-26 MED ORDER — HYDROMORPHONE HCL 2 MG PO TABS: 2.0000 mg | ORAL_TABLET | Freq: Four times a day (QID) | ORAL | Status: DC | PRN

## 2014-08-26 NOTE — H&P (Signed)
[facility] [facility] NAME: Janai Maudlin    MR#:  [id number]  DATE OF BIRTH:  1955/07/15   DATE OF ADMISSION:  08/26/2014  PRIMARY CARE PHYSICIAN: JADALI,FAYEGH, MD   REQUESTING/REFERRING PHYSICIAN: Quale  CHIEF COMPLAINT:   Chief Complaint  Patient presents with  . Shortness of Breath    HISTORY OF PRESENT ILLNESS:  Emma-Lee Oddo  is a 59 y.o. female with a known history of diastolic congestive heart failure, cirrhosis of liver, COPD oxygen dependent 2 L nasal cannula Baseline presenting with shortness of breath. Describes shortness of breath mainly has dyspnea on exertion for 1-2 day total duration. Of note she does have transfusion dependent anemia and had a blood transfusion about 4 days ago. Since that time she is a been complaining of worsening lower extremity edema, worsening orthopnea, dyspnea on exertion which resolves worsening. She states that she does have a nonproductive cough for 1 day total duration however denies any fevers, chills, chest pain.  PAST MEDICAL HISTORY:   Past Medical History  Diagnosis Date  . GERD (gastroesophageal reflux disease)   . Colon polyp   . Cirrhosis of liver   . Hypertension   . Depression   . COPD (chronic obstructive pulmonary disease)   . IBS (irritable bowel syndrome)   . On home oxygen therapy     "2L; 24/7" (05/04/2014)  . Family history of adverse reaction to anesthesia     "my sister doesn't wake up good when she's put deep to sleep"  . High cholesterol   . Heart murmur   . Asthma   . Pneumonia "several times"  . Chronic bronchitis     "get it pretty much q yr"   . Sleep apnea     "can't tolerate CPAP" (05/04/2014)  . Type II diabetes mellitus   . History of blood transfusion     "related to my anemia"  . Anemia   . Iron deficiency anemia   . Headache     "more than a couple times/wk" (05/04/2014)  . Arthritis     "some in my feet" (05/04/2014)  . Chronic  lower back pain   . Anxiety   . Acute on chronic diastolic CHF (congestive heart failure) 05/16/2014  . OCD (obsessive compulsive disorder)   . Panic attack   . PTSD (post-traumatic stress disorder)   . Chronic respiratory failure   . Cirrhosis of liver   . IDA (iron deficiency anemia) 08/14/2014    PAST SURGICAL HISTORY:   Past Surgical History  Procedure Laterality Date  . Cesarean section  1979  . Cataract extraction w/ intraocular lens  implant, bilateral Bilateral 2005  . Back surgery    . Colonoscopy  2014  . Upper gastrointestinal endoscopy    . Posterior fusion cervical spine  2015    "rebuilt 3 of my neck vertebrae"  . Incision and drainage of wound Right 2009    "leg mauled  by dog"  . Tubal ligation  1979  . Dilation and curettage of uterus    . Peripheral vascular catheterization N/A 08/16/2014    Procedure: PICC Line Insertion;  Surgeon: Algernon Huxley, MD;  Location: [facility];  Service: Cardiovascular;  Laterality: N/A;    SOCIAL HISTORY:   History  Substance Use Topics  . Smoking status: Current Every Day Smoker -- 0.50 packs/day for 48 years    Types: Cigarettes  . Smokeless tobacco: Never Used  .  Alcohol Use: No    FAMILY HISTORY:   Family History  Problem Relation Age of Onset  . Cancer Father     pancreatic    DRUG ALLERGIES:   Allergies  Allergen Reactions  . Iodine Shortness Of Breath  . Latex Anaphylaxis  . Oxycodone Shortness Of Breath  . Percocet [Oxycodone-Acetaminophen] Shortness Of Breath  . Shellfish Allergy Anaphylaxis  . Amitriptyline Other (See Comments)    Mental changes  . Fish Allergy Swelling and Other (See Comments)    "respiratory distress and wheezing"  . Tape Other (See Comments)    Pulls skin off  . Wellbutrin [Bupropion] Other (See Comments)    Mental changes  . Augmentin [Amoxicillin-Pot Clavulanate] Rash  . Codeine Rash  . Cortisone Rash  . Diphenhydramine Rash  . Other Rash    darvocet  . Vicodin  [Hydrocodone-Acetaminophen] Rash    dizzy    REVIEW OF SYSTEMS:  REVIEW OF SYSTEMS:  CONSTITUTIONAL: Denies fevers, chills, fatigue, weakness.  EYES: Denies blurred vision, double vision, or eye pain.  EARS, NOSE, THROAT: Denies tinnitus, ear pain, hearing loss.  RESPIRATORY: Positive cough, shortness of breath, denies wheezing  CARDIOVASCULAR: Denies chest pain, palpitations, positive edema, orthopnea GASTROINTESTINAL: Denies nausea, vomiting, diarrhea, abdominal pain. Positive abdominal distention GENITOURINARY: Denies dysuria, hematuria.  ENDOCRINE: Denies nocturia or thyroid problems. HEMATOLOGIC AND LYMPHATIC: Positive easy bruising denies bleeding.  SKIN: Denies rash or lesions.  MUSCULOSKELETAL: Denies pain in neck, back, shoulder, knees, hips, or further arthritic symptoms.  NEUROLOGIC: Denies paralysis, paresthesias.  PSYCHIATRIC: Denies anxiety or depressive symptoms. Otherwise full review of systems performed by me is negative.   MEDICATIONS AT HOME:   Prior to Admission medications   Medication Sig Start Date End Date Taking? Authorizing Provider  acetaminophen (TYLENOL) 500 MG tablet Take 1,000 mg by mouth every 6 (six) hours as needed for moderate pain or headache.    Historical Provider, MD  albuterol (PROVENTIL HFA;VENTOLIN HFA) 108 (90 BASE) MCG/ACT inhaler Inhale 2 puffs into the lungs every 4 (four) hours as needed for wheezing or shortness of breath.    Historical Provider, MD  albuterol-ipratropium (COMBIVENT) 18-103 MCG/ACT inhaler Inhale into the lungs.    Historical Provider, MD  allopurinol (ZYLOPRIM) 100 MG tablet Take 100 mg by mouth 2 (two) times daily.     Historical Provider, MD  Amino Acids-Protein Hydrolys (FEEDING SUPPLEMENT, PRO-STAT SUGAR FREE 64,) LIQD Take 30 mLs by mouth daily.     Historical Provider, MD  ARIPiprazole (ABILIFY) 5 MG tablet Take 5 mg by mouth daily.     Historical Provider, MD  atorvastatin (LIPITOR) 10 MG tablet Take 10 mg by  mouth daily at 6 PM.     Historical Provider, MD  atorvastatin (LIPITOR) 10 MG tablet Take by mouth.    Historical Provider, MD  baclofen (LIORESAL) 10 MG tablet Take 10 mg by mouth 3 (three) times daily.    Historical Provider, MD  budesonide-formoterol (SYMBICORT) 80-4.5 MCG/ACT inhaler Inhale 2 puffs into the lungs 2 (two) times daily as needed (shortness of breath).     Historical Provider, MD  bumetanide (BUMEX) 1 MG tablet 1 tablet daily or as directed. Patient taking differently: Take 1 mg by mouth daily. 1 tablet daily or as directed. 05/09/14   Delfina Redwood, MD  buprenorphine (BUTRANS - DOSED MCG/HR) 10 MCG/HR PTWK patch Place 1 patch (10 mcg total) onto the skin once a week. 05/18/14   Venetia Maxon Rama, MD  buprenorphine (BUTRANS) 10 MCG/HR PTWK  patch Place onto the skin.    Historical Provider, MD  cephALEXin (KEFLEX) 500 MG capsule Take 1 capsule (500 mg total) by mouth 2 (two) times daily. 07/09/14   Fritzi Mandes, MD  clonazePAM (KLONOPIN) 0.5 MG tablet Take 1 tablet (0.5 mg total) by mouth daily. Patient taking differently: Take 0.5 mg by mouth at bedtime.  05/22/14   Estill Dooms, MD  clonazePAM (KLONOPIN) 0.5 MG tablet Take by mouth.    Historical Provider, MD  cyclobenzaprine (FLEXERIL) 10 MG tablet Take 10 mg by mouth 3 (three) times daily as needed for muscle spasms.    Historical Provider, MD  Ergocalciferol (VITAMIN D2) 2000 UNITS TABS     Historical Provider, MD  feeding supplement, GLUCERNA SHAKE, (GLUCERNA SHAKE) LIQD Take 237 mLs by mouth 3 (three) times daily between meals. 05/18/14   Venetia Maxon Rama, MD  folic acid (FOLVITE) 1 MG tablet Take 1 tablet (1 mg total) by mouth daily. 07/25/14   Lucilla Lame, MD  gabapentin (NEURONTIN) 300 MG capsule Take 300 mg by mouth at bedtime.    Historical Provider, MD  gabapentin (NEURONTIN) 300 MG capsule Take by mouth.    Historical Provider, MD  glipiZIDE (GLUCOTROL XL) 10 MG 24 hr tablet Take by mouth.    Historical Provider, MD   guaiFENesin (MUCINEX) 600 MG 12 hr tablet Take 600 mg by mouth 2 (two) times daily as needed for cough.     Historical Provider, MD  HYDROmorphone (DILAUDID) 2 MG tablet Take 2 mg by mouth every 6 (six) hours as needed for moderate pain or severe pain.    Historical Provider, MD  ipratropium-albuterol (DUONEB) 0.5-2.5 (3) MG/3ML SOLN Take 3 mLs by nebulization every 4 (four) hours as needed. And Q6H PRN    Historical Provider, MD  lactulose (CHRONULAC) 10 GM/15ML solution TAKE 30 ML BY MOUTH 2 TIMES DAILY 07/06/14   Dallas Schimke, MD  lactulose, encephalopathy, (CHRONULAC) 10 GM/15ML SOLN Take 20 g by mouth 2 (two) times daily.     Historical Provider, MD  lamoTRIgine (LAMICTAL) 150 MG tablet Take 150 mg by mouth at bedtime.     Historical Provider, MD  lamoTRIgine (LAMICTAL) 150 MG tablet Take by mouth.    Historical Provider, MD  levothyroxine (SYNTHROID, LEVOTHROID) 75 MCG tablet Take 75 mcg by mouth daily.     Historical Provider, MD  Levothyroxine Sodium 75 MCG CAPS Take by mouth.    Historical Provider, MD  lisinopril (PRINIVIL,ZESTRIL) 10 MG tablet Take by mouth.    Historical Provider, MD  lisinopril (PRINIVIL,ZESTRIL) 20 MG tablet Take 20 mg by mouth daily.    Historical Provider, MD  magnesium oxide (MAG-OX) 400 MG tablet Take 400 mg by mouth 2 (two) times daily.    Historical Provider, MD  Magnesium Oxide, Antacid, 500 MG CAPS Take by mouth.    Historical Provider, MD  metoCLOPramide (REGLAN) 5 MG tablet Take 5 mg by mouth 4 (four) times daily -  before meals and at bedtime.     Historical Provider, MD  metoprolol succinate (TOPROL-XL) 25 MG 24 hr tablet Take 25 mg by mouth 2 (two) times daily.    Historical Provider, MD  metoprolol tartrate (LOPRESSOR) 25 MG tablet Take by mouth.    Historical Provider, MD  montelukast (SINGULAIR) 10 MG tablet Take 10 mg by mouth at bedtime.    Historical Provider, MD  Multiple Vitamin (MULTIVITAMIN WITH MINERALS) TABS tablet Take 1 tablet by mouth  daily. 05/18/14   Christina  P Rama, MD  ondansetron (ZOFRAN) 4 MG tablet Take 4 mg by mouth 2 (two) times daily as needed for nausea or vomiting.    Historical Provider, MD  pantoprazole (PROTONIX) 40 MG tablet Take 40 mg by mouth daily.    Historical Provider, MD  pantoprazole (PROTONIX) 40 MG tablet Take by mouth.    Historical Provider, MD  polyethylene glycol (MIRALAX / GLYCOLAX) packet Take 17 g by mouth daily as needed for mild constipation.    Historical Provider, MD  potassium chloride (K-DUR) 10 MEQ tablet Take 10 mEq by mouth at bedtime.    Historical Provider, MD  potassium chloride (K-DUR,KLOR-CON) 10 MEQ tablet Take by mouth.    Historical Provider, MD  Pramoxine-HC (HYDROCORTISONE ACE-PRAMOXINE) 2.5-1 % CREA Apply 1 application topically 2 (two) times daily. Patient taking differently: Apply 1 application topically 2 (two) times daily as needed.  03/15/14   Seeplaputhur Robinette Haines, MD  Saxagliptin-Metformin (KOMBIGLYZE XR) 2.06-998 MG TB24 Take by mouth.    Historical Provider, MD  Saxagliptin-Metformin 2.06-998 MG TB24 Take 1 tablet by mouth 2 (two) times daily.    Historical Provider, MD  sennosides-docusate sodium (SENOKOT-S) 8.6-50 MG tablet Take 2 tablets by mouth at bedtime.     Historical Provider, MD  sertraline (ZOLOFT) 100 MG tablet Take 100 mg by mouth at bedtime.     Historical Provider, MD  sertraline (ZOLOFT) 100 MG tablet Take by mouth.    Historical Provider, MD  spironolactone (ALDACTONE) 25 MG tablet Take 2 tablets (50 mg total) by mouth daily. 07/25/14 07/25/15  Lucilla Lame, MD  thiamine 100 MG tablet Take 1 tablet (100 mg total) by mouth daily. 05/18/14   Venetia Maxon Rama, MD  Vitamin D, Ergocalciferol, (DRISDOL) 50000 UNITS CAPS capsule Take 50,000 Units by mouth every 7 (seven) days. Saturday    Historical Provider, MD      VITAL SIGNS:  Blood pressure 131/65, pulse 79, temperature 98.1 F (36.7 C), temperature source Oral, resp. rate 18, height 5\' 4"  (1.626 m),  weight 199 lb (90.266 kg), SpO2 100 %.  PHYSICAL EXAMINATION:  VITAL SIGNS: Filed Vitals:   08/26/14 2100  BP: 131/65  Pulse: 79  Temp:   Resp:    GENERAL:58 y.o.female currently in no acute distress. However, chronically ill-appearing HEAD: Normocephalic, atraumatic.  EYES: Pupils equal, round, reactive to light. Extraocular muscles intact. No scleral icterus.  MOUTH: Moist mucosal membrane. Dentition intact. No abscess noted.  EAR, NOSE, THROAT: Clear without exudates. No external lesions.  NECK: Supple. No thyromegaly. No nodules. No JVD.  PULMONARY: Bibasilar crackles without wheeze rails . No use of accessory muscles, Good respiratory effort. good air entry bilaterally CHEST: Nontender to palpation.  CARDIOVASCULAR: S1 and S2. Regular rate and rhythm. No murmurs, rubs, or gallops. 2+ edema. Pedal pulses 2+ bilaterally.  GASTROINTESTINAL: Soft, nontender, positive distention with fluid wave. No masses. Positive bowel sounds. No hepatosplenomegaly.  MUSCULOSKELETAL: No swelling, clubbing, or edema. Range of motion full in all extremities.  NEUROLOGIC: Cranial nerves II through XII are intact. No gross focal neurological deficits. Sensation intact. Reflexes intact.  SKIN: No ulceration, lesions, rashes, or cyanosis. Skin warm and dry. Turgor intact.  PSYCHIATRIC: Mood, affect within normal limits. The patient is awake, alert and oriented x 3. Insight, judgment intact.    LABORATORY PANEL:   CBC  Recent Labs Lab 08/26/14 1834  WBC 3.3*  HGB 9.2*  HCT 28.5*  PLT 98*   ------------------------------------------------------------------------------------------------------------------  Chemistries   Recent Labs Lab 08/26/14  1834  NA 123*  K 4.8  CL 91*  CO2 23  GLUCOSE 167*  BUN 13  CREATININE 0.97  CALCIUM 8.0*   ------------------------------------------------------------------------------------------------------------------  Cardiac Enzymes  Recent Labs Lab  08/26/14 1834  TROPONINI <0.03   ------------------------------------------------------------------------------------------------------------------  RADIOLOGY:  Dg Chest 2 View  08/26/2014   CLINICAL DATA:  Two day history of productive cough and shortness of breath. Oxygen dependent COPD. Current history of hepatic cirrhosis, diabetes and hypertension. Prior CHF.  EXAM: CHEST  2 VIEW  COMPARISON:  None.  FINDINGS: Cardiac silhouette upper normal in size to slightly enlarged, unchanged. Hilar and mediastinal contours otherwise unremarkable. Development of mild diffuse interstitial pulmonary opacities since the most recent prior examination. No confluent airspace consolidation. No pleural effusions. Mild degenerative changes involving the thoracic spine. Right arm midline catheter tip in the central portion of the right subclavian vein.  IMPRESSION: Diffuse interstitial opacities which may reflect acute bronchitis or mild diffuse interstitial pulmonary edema, superimposed upon COPD. Absence of pleural effusions and current productive cough favors acute bronchitis. No evidence of focal airspace pneumonia.   Electronically Signed   By: Evangeline Dakin M.D.   On: 08/26/2014 20:24    EKG:   Orders placed or performed during the hospital encounter of 08/26/14  . ED EKG (<33mins upon arrival to the ED)  . ED EKG (<29mins upon arrival to the ED)    IMPRESSION AND PLAN:   59 year old Caucasian female with diastolic congestive heart care, COPD oxygen dependent, cirrhosis of liver presenting with shortness of breath.  1. Acute on chronic diastolic congestive heart failure: Admit to telemetry, continue with diuresis, follow daily weights and ins and outs, supplemental oxygen, DuoNeb treatments 2. COPD, chronic respiratory failure: Continue oxygen as well as DuoNeb treatments continue Symbicort 3. Cirrhosis of liver: Continue the spironolactone with concurrent diuresis, check liver function tests  including albumin 4. Type 2 diabetes uncomplicated: Hold oral agents at insulin sliding scale 5. Hyperlipidemia unspecified: Lipitor 6. Venous thromboembolism prophylactic: Heparin subcutaneous (as always platelets greater than 50)  All the records are reviewed and case discussed with ED provider. Management plans discussed with the patient, family and they are in agreement.  CODE STATUS: Full code  TOTAL TIME TAKING CARE OF THIS PATIENT: 45 minutes.    Caitlynne Harbeck,  Karenann Cai.D on 08/26/2014 at 10:09 PM  Between 7am to 6pm - Pager - [phone]  After 6pm: House Pager: - [phone]  [facility]  Office  [phone]  CC: Primary care physician; [facility], [place]

## 2014-08-26 NOTE — ED Notes (Signed)
Pt reports receiving blood transfusion on Wednesday. SOB beginning yesterday and a reported weight gain of 20 lbs in one week. Bilateral pedal edema noted. Pt reports feeling "heavy" in abd. And tenderness upon palpation. Hx CHF.

## 2014-08-26 NOTE — ED Notes (Signed)
Pt has CHF, pt reports a weight increased of 10 pounds in 3 days with increased shortness of breath today

## 2014-08-26 NOTE — ED Notes (Signed)
Catheter not inserted in pt upon arrival to ED. Removed from pts record.

## 2014-08-26 NOTE — ED Provider Notes (Addendum)
[facility] Emergency Department Provider Note  ____________________________________________  Time seen: Approximately 7:57 PM  I have reviewed the triage vital signs and the nursing notes.   HISTORY  Chief Complaint Shortness of Breath    HPI Samantha Mcmillan is a 59 y.o. female a very extensive past medical history including COPD, CHF, lower extremity cellulitis being followed by wound care clinic, cirrhosis.  Patient states that for the last few days she is started feeling slight shortness of breath. This has been increasing over the last several days. She states she has felt this way before and feels that she is swollen in her abdomen and both legs. She notes a history of congestive heart failure with similar symptoms passed. He does have COPD history, but denies wheezing. She did try a nebulizer at home but this did not seem to improve her symptoms any.  Patient states she is taking Bumex 1 mg once a day for congestive heart failure. She does feel her shortness of breath is worse with laying flat. She states that she rarely is active, and doesn't really know if her symptoms are much worse with getting up and walking. She is on 2 L of oxygen at home at baseline.  No pain. She reports she had cellulitis on her legs, but this is improved significantly. No redness swelling or fevers. She has a slight nonproductive cough at times.   Past Medical History  Diagnosis Date  . GERD (gastroesophageal reflux disease)   . Colon polyp   . Cirrhosis of liver   . Hypertension   . Depression   . COPD (chronic obstructive pulmonary disease)   . IBS (irritable bowel syndrome)   . On home oxygen therapy     "2L; 24/7" (05/04/2014)  . Family history of adverse reaction to anesthesia     "my sister doesn't wake up good when she's put deep to sleep"  . High cholesterol   . Heart murmur   . Asthma   . Pneumonia "several times"  . Chronic bronchitis     "get it pretty  much q yr"   . Sleep apnea     "can't tolerate CPAP" (05/04/2014)  . Type II diabetes mellitus   . History of blood transfusion     "related to my anemia"  . Anemia   . Iron deficiency anemia   . Headache     "more than a couple times/wk" (05/04/2014)  . Arthritis     "some in my feet" (05/04/2014)  . Chronic lower back pain   . Anxiety   . Acute on chronic diastolic CHF (congestive heart failure) 05/16/2014  . OCD (obsessive compulsive disorder)   . Panic attack   . PTSD (post-traumatic stress disorder)   . Chronic respiratory failure   . Cirrhosis of liver   . IDA (iron deficiency anemia) 08/14/2014    Patient Active Problem List   Diagnosis Date Noted  . IDA (iron deficiency anemia) 08/14/2014  . Ascites 07/25/2014  . Cirrhosis, nonalcoholic 80/04/4915  . Cellulitis of both lower extremities 07/07/2014  . Cellulitis and abscess of lower extremity 07/07/2014  . Metabolic & toxic encephalopathy, multifactorial 05/18/2014  . Encephalopathy, hepatic 05/18/2014  . Polypharmacy 05/18/2014  . Failure to thrive in adult 05/16/2014  . Other pancytopenia 05/16/2014  . Hepatic cirrhosis 05/16/2014  . Protein-calorie malnutrition, severe 05/16/2014  . Advanced COPD 05/16/2014  . Acute on chronic diastolic CHF (congestive heart failure) 05/16/2014  . Chronic back pain 05/16/2014  .  Weakness 05/15/2014  . Hyponatremia 05/15/2014  . HCAP (healthcare-associated pneumonia) 05/09/2014  . Urinary retention 05/09/2014  . DM2 (diabetes mellitus, type 2) 05/09/2014  . AKI (acute kidney injury) 05/05/2014  . Hypothyroidism 05/05/2014    Past Surgical History  Procedure Laterality Date  . Cesarean section  1979  . Cataract extraction w/ intraocular lens  implant, bilateral Bilateral 2005  . Back surgery    . Colonoscopy  2014  . Upper gastrointestinal endoscopy    . Posterior fusion cervical spine  2015    "rebuilt 3 of my neck vertebrae"  . Incision and drainage of wound Right 2009     "leg mauled  by dog"  . Tubal ligation  1979  . Dilation and curettage of uterus    . Peripheral vascular catheterization N/A 08/16/2014    Procedure: PICC Line Insertion;  Surgeon: Algernon Huxley, MD;  Location: [place] [facility];  Service: Cardiovascular;  Laterality: N/A;    Current Outpatient Rx  Name  Route  Sig  Dispense  Refill  . acetaminophen (TYLENOL) 500 MG tablet   Oral   Take 1,000 mg by mouth every 6 (six) hours as needed for moderate pain or headache.         . albuterol (PROVENTIL HFA;VENTOLIN HFA) 108 (90 BASE) MCG/ACT inhaler   Inhalation   Inhale 2 puffs into the lungs every 4 (four) hours as needed for wheezing or shortness of breath.         Marland Kitchen albuterol-ipratropium (COMBIVENT) 18-103 MCG/ACT inhaler   Inhalation   Inhale into the lungs.         Marland Kitchen allopurinol (ZYLOPRIM) 100 MG tablet   Oral   Take 100 mg by mouth 2 (two) times daily.          . Amino Acids-Protein Hydrolys (FEEDING SUPPLEMENT, PRO-STAT SUGAR FREE 64,) LIQD   Oral   Take 30 mLs by mouth daily.          . ARIPiprazole (ABILIFY) 5 MG tablet   Oral   Take 5 mg by mouth daily.          Marland Kitchen atorvastatin (LIPITOR) 10 MG tablet   Oral   Take 10 mg by mouth daily at 6 PM.          . atorvastatin (LIPITOR) 10 MG tablet   Oral   Take by mouth.         . baclofen (LIORESAL) 10 MG tablet   Oral   Take 10 mg by mouth 3 (three) times daily.         . budesonide-formoterol (SYMBICORT) 80-4.5 MCG/ACT inhaler   Inhalation   Inhale 2 puffs into the lungs 2 (two) times daily as needed (shortness of breath).          . bumetanide (BUMEX) 1 MG tablet      1 tablet daily or as directed. Patient taking differently: Take 1 mg by mouth daily. 1 tablet daily or as directed.   30 tablet   0   . buprenorphine (BUTRANS - DOSED MCG/HR) 10 MCG/HR PTWK patch   Transdermal   Place 1 patch (10 mcg total) onto the skin once a week.   4 patch   0   . buprenorphine (BUTRANS) 10 MCG/HR  PTWK patch   Transdermal   Place onto the skin.         . cephALEXin (KEFLEX) 500 MG capsule   Oral   Take 1 capsule (500 mg total) by  mouth 2 (two) times daily.   20 capsule   0   . clonazePAM (KLONOPIN) 0.5 MG tablet   Oral   Take 1 tablet (0.5 mg total) by mouth daily. Patient taking differently: Take 0.5 mg by mouth at bedtime.    60 tablet   0   . clonazePAM (KLONOPIN) 0.5 MG tablet   Oral   Take by mouth.         . cyclobenzaprine (FLEXERIL) 10 MG tablet   Oral   Take 10 mg by mouth 3 (three) times daily as needed for muscle spasms.         . Ergocalciferol (VITAMIN D2) 2000 UNITS TABS               . feeding supplement, GLUCERNA SHAKE, (GLUCERNA SHAKE) LIQD   Oral   Take 237 mLs by mouth 3 (three) times daily between meals.      0   . folic acid (FOLVITE) 1 MG tablet   Oral   Take 1 tablet (1 mg total) by mouth daily.      5   . gabapentin (NEURONTIN) 300 MG capsule   Oral   Take 300 mg by mouth at bedtime.         . gabapentin (NEURONTIN) 300 MG capsule   Oral   Take by mouth.         Marland Kitchen glipiZIDE (GLUCOTROL XL) 10 MG 24 hr tablet   Oral   Take by mouth.         Marland Kitchen guaiFENesin (MUCINEX) 600 MG 12 hr tablet   Oral   Take 600 mg by mouth 2 (two) times daily as needed for cough.          Marland Kitchen HYDROmorphone (DILAUDID) 2 MG tablet   Oral   Take 2 mg by mouth every 6 (six) hours as needed for moderate pain or severe pain.         Marland Kitchen ipratropium-albuterol (DUONEB) 0.5-2.5 (3) MG/3ML SOLN   Nebulization   Take 3 mLs by nebulization every 4 (four) hours as needed. And Q6H PRN         . lactulose (CHRONULAC) 10 GM/15ML solution      TAKE 30 ML BY MOUTH 2 TIMES DAILY   480 mL   0   . lactulose, encephalopathy, (CHRONULAC) 10 GM/15ML SOLN   Oral   Take 20 g by mouth 2 (two) times daily.          Marland Kitchen lamoTRIgine (LAMICTAL) 150 MG tablet   Oral   Take 150 mg by mouth at bedtime.          . lamoTRIgine (LAMICTAL) 150 MG tablet    Oral   Take by mouth.         . levothyroxine (SYNTHROID, LEVOTHROID) 75 MCG tablet   Oral   Take 75 mcg by mouth daily.          . Levothyroxine Sodium 75 MCG CAPS   Oral   Take by mouth.         Marland Kitchen lisinopril (PRINIVIL,ZESTRIL) 10 MG tablet   Oral   Take by mouth.         Marland Kitchen lisinopril (PRINIVIL,ZESTRIL) 20 MG tablet   Oral   Take 20 mg by mouth daily.         . magnesium oxide (MAG-OX) 400 MG tablet   Oral   Take 400 mg by mouth 2 (two) times daily.         Marland Kitchen  Magnesium Oxide, Antacid, 500 MG CAPS   Oral   Take by mouth.         . metoCLOPramide (REGLAN) 5 MG tablet   Oral   Take 5 mg by mouth 4 (four) times daily -  before meals and at bedtime.          . metoprolol succinate (TOPROL-XL) 25 MG 24 hr tablet   Oral   Take 25 mg by mouth 2 (two) times daily.         . metoprolol tartrate (LOPRESSOR) 25 MG tablet   Oral   Take by mouth.         . montelukast (SINGULAIR) 10 MG tablet   Oral   Take 10 mg by mouth at bedtime.         . Multiple Vitamin (MULTIVITAMIN WITH MINERALS) TABS tablet   Oral   Take 1 tablet by mouth daily.         . ondansetron (ZOFRAN) 4 MG tablet   Oral   Take 4 mg by mouth 2 (two) times daily as needed for nausea or vomiting.         . pantoprazole (PROTONIX) 40 MG tablet   Oral   Take 40 mg by mouth daily.         . pantoprazole (PROTONIX) 40 MG tablet   Oral   Take by mouth.         . polyethylene glycol (MIRALAX / GLYCOLAX) packet   Oral   Take 17 g by mouth daily as needed for mild constipation.         . potassium chloride (K-DUR) 10 MEQ tablet   Oral   Take 10 mEq by mouth at bedtime.         . potassium chloride (K-DUR,KLOR-CON) 10 MEQ tablet   Oral   Take by mouth.         . Pramoxine-HC (HYDROCORTISONE ACE-PRAMOXINE) 2.5-1 % CREA   Apply externally   Apply 1 application topically 2 (two) times daily. Patient taking differently: Apply 1 application topically 2 (two) times daily  as needed.    1 Tube   0   . Saxagliptin-Metformin (KOMBIGLYZE XR) 2.06-998 MG TB24   Oral   Take by mouth.         . Saxagliptin-Metformin 2.06-998 MG TB24   Oral   Take 1 tablet by mouth 2 (two) times daily.         . sennosides-docusate sodium (SENOKOT-S) 8.6-50 MG tablet   Oral   Take 2 tablets by mouth at bedtime.          . sertraline (ZOLOFT) 100 MG tablet   Oral   Take 100 mg by mouth at bedtime.          . sertraline (ZOLOFT) 100 MG tablet   Oral   Take by mouth.         . spironolactone (ALDACTONE) 25 MG tablet   Oral   Take 2 tablets (50 mg total) by mouth daily.   30 tablet   5   . thiamine 100 MG tablet   Oral   Take 1 tablet (100 mg total) by mouth daily.         . Vitamin D, Ergocalciferol, (DRISDOL) 50000 UNITS CAPS capsule   Oral   Take 50,000 Units by mouth every 7 (seven) days. Saturday           Allergies Iodine; Latex; Oxycodone; Percocet; Shellfish allergy; Amitriptyline; Fish allergy; Tape;  Wellbutrin; Augmentin; Codeine; Cortisone; Diphenhydramine; Other; and Vicodin  Family History  Problem Relation Age of Onset  . Cancer Father     pancreatic    Social History History  Substance Use Topics  . Smoking status: Current Every Day Smoker -- 0.50 packs/day for 48 years    Types: Cigarettes  . Smokeless tobacco: Never Used  . Alcohol Use: No    Review of Systems Constitutional: No fever/chills Eyes: No visual changes. ENT: No sore throat. Cardiovascular: Denies chest pain. Respiratory: See history of present illness. Mild feeling of shortness of breath. Gastrointestinal: No abdominal pain.  No nausea, no vomiting.  No diarrhea.  No constipation. Genitourinary: Negative for dysuria. Musculoskeletal: Negative for back pain. Skin: Negative for rash. Neurological: Negative for headaches, focal weakness or numbness.  Patient reports gaining about 20 pounds over the last 1-2 weeks and 10 pounds in the last 3 days. She  had a blood transfusion Wednesday, and her symptoms started slightly worse after that.  10-point ROS otherwise negative.  ____________________________________________   PHYSICAL EXAM:  VITAL SIGNS: ED Triage Vitals  Enc Vitals Group     BP --      Pulse Rate 08/26/14 1828 66     Resp --      Temp 08/26/14 1828 98.1 F (36.7 C)     Temp Source 08/26/14 1828 Oral     SpO2 08/26/14 1828 100 %     Weight 08/26/14 1828 199 lb (90.266 kg)     Height 08/26/14 1828 5\' 4"  (1.626 m)     Head Cir --      Peak Flow --      Pain Score 08/26/14 1829 3     Pain Loc --      Pain Edu? --      Excl. in [place]? --     Constitutional: Alert and oriented. Well appearing and in no acute distress. Speaks in full sentences. On 2 L nasal cannula with no evidence of distress. Eyes: Conjunctivae are normal. PERRL. EOMI. Head: Atraumatic. Nose: No congestion/rhinnorhea. Mouth/Throat: Mucous membranes are moist.  Oropharynx non-erythematous. Neck: No stridor.   Cardiovascular: Normal rate, regular rhythm. Grossly normal heart sounds.  Good peripheral circulation. Respiratory: Normal respiratory effort.  No retractions. Lungs with slight crackles in the bases bilaterally. No wheezing. Speaking in full sentences in no distress. Gastrointestinal: Soft and nontender. No distention. No abdominal bruits. No CVA tenderness. Musculoskeletal: Bilateral lower extremity edema 3+. Patient has Unna boot wraps on both legs, but there is no evidence of extending redness or cellulitis. She does have pitting edema in the legs bilaterally. Good peripheral pulses and capillary refill. Neurologic:  Normal speech and language. No gross focal neurologic deficits are appreciated. Speech is normal.  Skin:  Skin is warm, dry and intact. No rash noted. Psychiatric: Mood and affect are normal. Speech and behavior are normal.  ____________________________________________   LABS (all labs ordered are listed, but only abnormal  results are displayed)  Labs Reviewed  CBC - Abnormal; Notable for the following:    WBC 3.3 (*)    RBC 3.05 (*)    Hemoglobin 9.2 (*)    HCT 28.5 (*)    RDW 17.4 (*)    Platelets 98 (*)    All other components within normal limits  BASIC METABOLIC PANEL - Abnormal; Notable for the following:    Sodium 123 (*)    Chloride 91 (*)    Glucose, Bld 167 (*)    Calcium  8.0 (*)    All other components within normal limits  BRAIN NATRIURETIC PEPTIDE  TROPONIN I  HEPATIC FUNCTION PANEL   ____________________________________________  EKG  ED ECG REPORT I, Cattaleya Wien, the attending physician, personally viewed and interpreted this ECG.  Date: 08/26/2014 EKG Time: 1835 Rate: 90 Rhythm: normal sinus rhythm QRS Axis: normal Intervals: normal ST/T Wave abnormalities: normal Conduction Disutrbances: none Narrative Interpretation: unremarkable  ____________________________________________  RADIOLOGY  DG Chest 2 View (Final result) Result time: 08/26/14 20:24:43   Final result by Rad Results In Interface (08/26/14 20:24:43)   Narrative:   CLINICAL DATA: Two day history of productive cough and shortness of breath. Oxygen dependent COPD. Current history of hepatic cirrhosis, diabetes and hypertension. Prior CHF.  EXAM: CHEST 2 VIEW  COMPARISON: None.  FINDINGS: Cardiac silhouette upper normal in size to slightly enlarged, unchanged. Hilar and mediastinal contours otherwise unremarkable. Development of mild diffuse interstitial pulmonary opacities since the most recent prior examination. No confluent airspace consolidation. No pleural effusions. Mild degenerative changes involving the thoracic spine. Right arm midline catheter tip in the central portion of the right subclavian vein.  IMPRESSION: Diffuse interstitial opacities which may reflect acute bronchitis or mild diffuse interstitial pulmonary edema, superimposed upon COPD. Absence of pleural effusions and  current productive cough favors acute bronchitis. No evidence of focal airspace pneumonia.    ____________________________________________   PROCEDURES  Procedure(s) performed: None  Critical Care performed: No  ____________________________________________   INITIAL IMPRESSION / ASSESSMENT AND PLAN / ED COURSE  Pertinent labs & imaging results that were available during my care of the patient were reviewed by me and considered in my medical decision making (see chart for details).  Patient with a very complex medical history presents with dyspnea. Based on her symptomatology has suspect this is likely due to CHF, however her BNP is not elevated and her chest x-ray suggests that this may be bronchitic change versus interstitial edema. Given her weight gain and recent blood transfusions I favor this is still likely related to congestive heart failure. She is afebrile she is not having elevated white blood cell count and really has no acute infectious symptoms.  No evidence of acute coronary syndrome. No cardiac complaints.  Because the complexity of this patient's medical history and frequent recent admissions, I have discussed with Dr. Lavetta Nielsen of the hospitalist service who will see the patient in consultation in the ER to further assist in development of management plan.  ----------------------------------------- 9:45 PM on 08/26/2014 -----------------------------------------  I reevaluated the patient. She is awake and alert with stable vital signs. She is not hypoxic. We'll await consultation from Dr. Lavetta Nielsen regarding management recommendations. I do feel the patient is likely stable to be discharged, but again I have requested medical consult. Disposition pending, consult and in by Dr. Lavetta Nielsen. Plan of care assigned to Dr. Rip Harbour at this time. ____________________________________________   FINAL CLINICAL IMPRESSION(S) / ED DIAGNOSES  Final diagnoses:  Transfusion associated  circulatory overload  Congestive heart failure, unspecified congestive heart failure chronicity, unspecified congestive heart failure type   ----------------------------------------- 9:58 PM on 08/26/2014 -----------------------------------------  Patient was seen by Dr. Lavetta Nielsen who recommends admission to the hospital. We will admit for further treatment under the hospitalist service. Dr. Rip Harbour did not participate in the care of this patient.   Delman Kitten, MD 08/26/14 2147  Delman Kitten, MD 08/26/14 2159

## 2014-08-27 LAB — BASIC METABOLIC PANEL
Anion gap: 7 (ref 5–15)
BUN: 16 mg/dL (ref 6–20)
CALCIUM: 7.9 mg/dL — AB (ref 8.9–10.3)
CO2: 28 mmol/L (ref 22–32)
Chloride: 87 mmol/L — ABNORMAL LOW (ref 101–111)
Creatinine, Ser: 1.2 mg/dL — ABNORMAL HIGH (ref 0.44–1.00)
GFR calc Af Amer: 56 mL/min — ABNORMAL LOW (ref >60)
GFR, EST NON AFRICAN AMERICAN: 48 mL/min — AB (ref >60)
Glucose, Bld: 139 mg/dL — ABNORMAL HIGH (ref 65–99)
Potassium: 4.2 mmol/L (ref 3.5–5.1)
Sodium: 122 mmol/L — ABNORMAL LOW (ref 135–145)

## 2014-08-27 LAB — GLUCOSE, CAPILLARY
GLUCOSE-CAPILLARY: 140 mg/dL — AB (ref 65–99)
Glucose-Capillary: 87 mg/dL (ref 65–99)
Glucose-Capillary: 151 mg/dL — ABNORMAL HIGH (ref 65–99)
Glucose-Capillary: 130 mg/dL — ABNORMAL HIGH (ref 65–99)

## 2014-08-27 MED ORDER — PHENYLEPH-SHARK LIV OIL-MO-PET 0.25-3-14-71.9 % RE OINT
TOPICAL_OINTMENT | Freq: Two times a day (BID) | RECTAL | Status: DC | PRN
  Filled 2014-08-27: qty 28.4

## 2014-08-27 MED ORDER — WITCH HAZEL-GLYCERIN EX PADS
MEDICATED_PAD | CUTANEOUS | Status: DC | PRN
  Filled 2014-08-27 (×2): qty 100

## 2014-08-27 MED ORDER — PRAMOXINE-ZINC OXIDE IN MO 1-12.5 % RE OINT
TOPICAL_OINTMENT | Freq: Three times a day (TID) | RECTAL | Status: DC | PRN
  Filled 2014-08-27: qty 28.3

## 2014-08-27 NOTE — Consult Note (Signed)
WOC wound consult note Reason for Consult: LE ulcers Met with patient and her husband at the bedside. She is managed by wound care at [facility] per patient. She has no open wounds currently. She has one scabbed area on the right pretibial area. She is seen weekly for Unna's boot changes bilaterally in the MD office and is scheduled for Wednesday. The current Unna's boots are intact and clean. Per the patient's report being used for edema control at this point.   [place] team will follow along with you, if the patient is still inpatient Wednesday July 6th, we will change. If she is planned for DC this date as well we will change.  If dc prior to Wednesday she will keep her follow up appointment as scheduled to keep her changes on the appropriate schedule.  Pt in agreement with this plan. Supplies ordered and placed in the room.  Siler Mavis Goliad RN,CWOCN [phone]

## 2014-08-27 NOTE — Progress Notes (Signed)
Per patient, PICC line dressing on right upper arm was changed two days ago. 08/25/2014.

## 2014-08-27 NOTE — Progress Notes (Signed)
Dr. Posey Pronto paged regarding resulted sodium, which is 122. Given verbal order to page Dr. Holley Raring for consult for hyponatremia. No further orders received.

## 2014-08-27 NOTE — Progress Notes (Addendum)
Pt has bilateral Unna dressings running from knee to ankle. Pt reports to have a diabetic ulcer to left leg and cellulitis to right leg. Pt also has the following. Scratches to both lower arms, redness to both groins, ecchymosis to upper chest, arms, and abdomen, and bilateral brown discoloration to both arms r/t cirrhosis. Witnessed by, Lattie Haw, RN

## 2014-08-27 NOTE — Progress Notes (Signed)
[facility] [place] NAME: Samantha Mcmillan    MR#:  [id number]  DATE OF BIRTH:  17-Nov-1955  SUBJECTIVE:  Came in with SOB. Feels better today  REVIEW OF SYSTEMS:   Review of Systems  Constitutional: Negative for fever, chills and weight loss.  HENT: Negative for ear discharge, ear pain and nosebleeds.   Eyes: Negative for blurred vision, pain and discharge.  Respiratory: Positive for shortness of breath. Negative for sputum production, wheezing and stridor.   Cardiovascular: Negative for chest pain, palpitations, orthopnea and PND.  Gastrointestinal: Negative for nausea, vomiting, abdominal pain and diarrhea.  Genitourinary: Negative for urgency and frequency.  Musculoskeletal: Negative for back pain and joint pain.  Neurological: Positive for weakness. Negative for sensory change, speech change and focal weakness.  Psychiatric/Behavioral: Negative for depression. The patient is not nervous/anxious.   All other systems reviewed and are negative.  Tolerating Diet:yes Tolerating PT: not needed  DRUG ALLERGIES:   Allergies  Allergen Reactions  . Iodine Shortness Of Breath  . Latex Anaphylaxis  . Oxycodone Shortness Of Breath  . Percocet [Oxycodone-Acetaminophen] Shortness Of Breath  . Shellfish Allergy Anaphylaxis  . Amitriptyline Other (See Comments)    Mental changes  . Fish Allergy Swelling and Other (See Comments)    "respiratory distress and wheezing"  . Tape Other (See Comments)    Pulls skin off  . Wellbutrin [Bupropion] Other (See Comments)    Mental changes  . Augmentin [Amoxicillin-Pot Clavulanate] Rash  . Codeine Rash  . Cortisone Rash  . Diphenhydramine Rash  . Other Rash    darvocet  . Vicodin [Hydrocodone-Acetaminophen] Rash    dizzy    VITALS:  Blood pressure 114/48, pulse 75, temperature 97.7 F (36.5 C), temperature source Oral, resp. rate 20, height 5\' 4"  (1.626 m), weight 88.769 kg (195 lb 11.2  oz), SpO2 100 %.  PHYSICAL EXAMINATION:   Physical Exam  GENERAL:58 y.o.female currently in no acute distress. However, chronically ill-appearing HEAD: Normocephalic, atraumatic.  EYES: Pupils equal, round, reactive to light. Extraocular muscles intact. No scleral icterus.  MOUTH: Moist mucosal membrane. Dentition intact. No abscess noted.  EAR, NOSE, THROAT: Clear without exudates. No external lesions.  NECK: Supple. No thyromegaly. No nodules. No JVD.  PULMONARY: Bibasilar crackles without wheeze. No use of accessory muscles, Good respiratory effort. good air entry bilaterally CHEST: Nontender to palpation.  CARDIOVASCULAR: S1 and S2. Regular rate and rhythm. No murmurs, rubs, or gallops. 2+ edema. Pedal pulses 2+ bilaterally.  GASTROINTESTINAL: Soft, nontender, positive distention with fluid wave. No masses. Positive bowel sounds. No hepatosplenomegaly.  MUSCULOSKELETAL: No swelling, clubbing, or edema. Range of motion full in all extremities.  NEUROLOGIC: Cranial nerves II through XII are intact. No gross focal neurological deficits. Sensation intact. Reflexes intact.  SKIN: No ulceration, lesions, rashes, or cyanosis. Skin warm and dry. Turgor intact.  PSYCHIATRIC: Mood, affect within normal limits. The patient is awake, alert and oriented x 3. Insight, judgment intact.  LABORATORY PANEL:   CBC  Recent Labs Lab 08/26/14 1834  WBC 3.3*  HGB 9.2*  HCT 28.5*  PLT 98*    Chemistries   Recent Labs Lab 08/26/14 1834  NA 123*  K 4.8  CL 91*  CO2 23  GLUCOSE 167*  BUN 13  CREATININE 0.97  CALCIUM 8.0*  AST 65*  ALT 29  ALKPHOS 289*  BILITOT 0.3    Cardiac Enzymes  Recent Labs Lab 08/26/14 1834  TROPONINI <0.03  RADIOLOGY:  Dg Chest 2 View  08/26/2014   CLINICAL DATA:  Two day history of productive cough and shortness of breath. Oxygen dependent COPD. Current history of hepatic cirrhosis, diabetes and hypertension. Prior CHF.  EXAM: CHEST  2 VIEW   COMPARISON:  None.  FINDINGS: Cardiac silhouette upper normal in size to slightly enlarged, unchanged. Hilar and mediastinal contours otherwise unremarkable. Development of mild diffuse interstitial pulmonary opacities since the most recent prior examination. No confluent airspace consolidation. No pleural effusions. Mild degenerative changes involving the thoracic spine. Right arm midline catheter tip in the central portion of the right subclavian vein.  IMPRESSION: Diffuse interstitial opacities which may reflect acute bronchitis or mild diffuse interstitial pulmonary edema, superimposed upon COPD. Absence of pleural effusions and current productive cough favors acute bronchitis. No evidence of focal airspace pneumonia.   Electronically Signed   By: Evangeline Dakin M.D.   On: 08/26/2014 20:24   ASSESSMENT AND PLAN:   59 year old Caucasian female with diastolic congestive heart care, COPD oxygen dependent, cirrhosis of liver presenting with shortness of breath.  1. Acute on chronic diastolic congestive heart failure: -  continue with diuresis, follow daily weights and ins and outs, supplemental oxygen, DuoNeb treatments 2. COPD, chronic respiratory failure: Continue oxygen as well as DuoNeb treatments continue Symbicort 3. Cirrhosis of liver: Continue the spironolactone with concurrent diuresis, check liver function tests including albumin 4. Type 2 diabetes uncomplicated: Hold oral agents at insulin sliding scale 5. Hyperlipidemia unspecified: Lipitor 6. Venous thromboembolism prophylactic: Heparin subcutaneous (as always platelets greater  Case discussed with Care Management/Social Worker. Management plans discussed with the patient, family and they are in agreement.  CODE STATUS:full  DVT Prophylaxis: heparin  TOTAL TIME TAKING CARE OF THIS PATIENT: 35 minutes.  >50% time spent on counselling and coordination of care  POSSIBLE D/C IN 1-2 DAYS, DEPENDING ON CLINICAL  CONDITION.   Naheem Mosco M.D on 08/27/2014 at 1:17 PM  Between 7am to 6pm - Pager - [phone]  After 6pm go to [email or web address] - password [email or web address] [facility]  Office  [phone]  CC: Primary care physician; [facility], [place]

## 2014-08-27 NOTE — Progress Notes (Signed)
Paged Dr. Posey Pronto regarding low sodium, 123. Pt is drowsy and reports being unable to stay awake, will drift off to sleep mid sentence. Received verbal order to get stat BMP and discontinue IV lasix. Dr. Posey Pronto requests to be paged with the results.

## 2014-08-28 LAB — BASIC METABOLIC PANEL
ANION GAP: 5 (ref 5–15)
BUN: 16 mg/dL (ref 6–20)
CALCIUM: 8 mg/dL — AB (ref 8.9–10.3)
CHLORIDE: 90 mmol/L — AB (ref 101–111)
CO2: 29 mmol/L (ref 22–32)
CREATININE: 0.93 mg/dL (ref 0.44–1.00)
GFR calc Af Amer: >60 mL/min (ref >60)
GFR calc non Af Amer: >60 mL/min (ref >60)
Glucose, Bld: 138 mg/dL — ABNORMAL HIGH (ref 65–99)
Potassium: 4.6 mmol/L (ref 3.5–5.1)
Sodium: 124 mmol/L — ABNORMAL LOW (ref 135–145)

## 2014-08-28 LAB — GLUCOSE, CAPILLARY
GLUCOSE-CAPILLARY: 145 mg/dL — AB (ref 65–99)
GLUCOSE-CAPILLARY: 107 mg/dL — AB (ref 65–99)
GLUCOSE-CAPILLARY: 123 mg/dL — AB (ref 65–99)
Glucose-Capillary: 82 mg/dL (ref 65–99)

## 2014-08-28 LAB — OSMOLALITY, URINE: Osmolality, Ur: 206 mOsm/kg — ABNORMAL LOW (ref 300–900)

## 2014-08-28 LAB — URIC ACID: Uric Acid, Serum: 4 mg/dL (ref 2.3–6.6)

## 2014-08-28 LAB — SODIUM, URINE, RANDOM: SODIUM UR: 74 mmol/L

## 2014-08-28 LAB — CORTISOL: Cortisol, Plasma: 13.1 ug/dL

## 2014-08-28 LAB — TSH: TSH: 3.772 u[IU]/mL (ref 0.350–4.500)

## 2014-08-28 LAB — OSMOLALITY: OSMOLALITY: 262 mosm/kg — AB (ref 275–295)

## 2014-08-28 MED ORDER — LISINOPRIL 5 MG PO TABS
5.0000 mg | ORAL_TABLET | Freq: Two times a day (BID) | ORAL | Status: DC
  Administered 2014-08-28 – 2014-08-29 (×3): 5 mg via ORAL
  Filled 2014-08-28 (×3): qty 1

## 2014-08-28 MED ORDER — ENOXAPARIN SODIUM 40 MG/0.4ML ~~LOC~~ SOLN
40.0000 mg | SUBCUTANEOUS | Status: DC
  Administered 2014-08-28: 40 mg via SUBCUTANEOUS
  Filled 2014-08-28: qty 0.4

## 2014-08-28 MED ORDER — BUMETANIDE 1 MG PO TABS
1.0000 mg | ORAL_TABLET | Freq: Every day | ORAL | Status: DC
  Administered 2014-08-28 – 2014-08-29 (×2): 1 mg via ORAL
  Filled 2014-08-28 (×2): qty 1

## 2014-08-28 NOTE — Consult Note (Signed)
[facility] CONSULT NOTE    Date: 08/28/2014                  Patient Name:  Samantha Mcmillan  MRN: [id number]  DOB: 02-16-1956  Age / Sex: 59 y.o., female         PCP: [facility], MD                 Service Requesting Consult: Fritzi Mandes, MD                 Reason for Consult: hyponatremia            History of Present Illness: Patient is a 59 y.o. female with a PMHx of cirrhosis of the liver, diastolic heart failure, COPD, hypertension, depression, irritable bowel syndrome hyperlipidemia, sleep apnea, diabetes mellitus type 2 who was admitted to [facility] on 08/26/2014 for evaluation of shortness of breath.  As above the patient has known history of liver cirrhosis as well as heart failure.we are asked to see the patient for evaluation management of hyponatremia.  Upon presentation serum sodium was low at 122.  Today's serum sodium was a bit higher at 124.  In review of records here at [facility] and it appears that serum sodium has ranged from 123-127 recently.the patient has been on Bumex as well as spironolactone at home for underlying edema and ascites.  Patient apparently has not been deemed to be a liver transplant candidate.   Medications: Outpatient medications: Prescriptions prior to admission  Medication Sig Dispense Refill Last Dose  . acetaminophen (TYLENOL) 500 MG tablet Take 1,000 mg by mouth every 6 (six) hours as needed for moderate pain or headache.   Past Week at Unknown time  . albuterol (PROVENTIL HFA;VENTOLIN HFA) 108 (90 BASE) MCG/ACT inhaler Inhale 2 puffs into the lungs every 4 (four) hours as needed for wheezing or shortness of breath.   08/15/2014 at Unknown time  . albuterol-ipratropium (COMBIVENT) 18-103 MCG/ACT inhaler Inhale into the lungs.   08/15/2014 at Unknown time  . allopurinol (ZYLOPRIM) 100 MG tablet Take 100 mg by mouth 2 (two) times daily.    08/15/2014 at Unknown time  . Amino Acids-Protein Hydrolys  (FEEDING SUPPLEMENT, PRO-STAT SUGAR FREE 64,) LIQD Take 30 mLs by mouth daily.    08/15/2014 at Unknown time  . ARIPiprazole (ABILIFY) 5 MG tablet Take 5 mg by mouth daily.    08/15/2014 at Unknown time  . atorvastatin (LIPITOR) 10 MG tablet Take 10 mg by mouth daily at 6 PM.    08/15/2014 at Unknown time  . atorvastatin (LIPITOR) 10 MG tablet Take by mouth.   08/15/2014 at Unknown time  . baclofen (LIORESAL) 10 MG tablet Take 10 mg by mouth 3 (three) times daily.   08/15/2014 at Unknown time  . budesonide-formoterol (SYMBICORT) 80-4.5 MCG/ACT inhaler Inhale 2 puffs into the lungs 2 (two) times daily as needed (shortness of breath).    08/15/2014 at Unknown time  . bumetanide (BUMEX) 1 MG tablet 1 tablet daily or as directed. (Patient taking differently: Take 1 mg by mouth daily. 1 tablet daily or as directed.) 30 tablet 0 08/15/2014 at Unknown time  . buprenorphine (BUTRANS - DOSED MCG/HR) 10 MCG/HR PTWK patch Place 1 patch (10 mcg total) onto the skin once a week. 4 patch 0 08/15/2014 at Unknown time  . buprenorphine (BUTRANS) 10 MCG/HR PTWK patch Place onto the skin.   08/15/2014 at Unknown time  . cephALEXin (KEFLEX) 500  MG capsule Take 1 capsule (500 mg total) by mouth 2 (two) times daily. 20 capsule 0 08/15/2014 at Unknown time  . clonazePAM (KLONOPIN) 0.5 MG tablet Take 1 tablet (0.5 mg total) by mouth daily. (Patient taking differently: Take 0.5 mg by mouth at bedtime. ) 60 tablet 0 08/15/2014 at Unknown time  . clonazePAM (KLONOPIN) 0.5 MG tablet Take by mouth.   08/15/2014 at Unknown time  . cyclobenzaprine (FLEXERIL) 10 MG tablet Take 10 mg by mouth 3 (three) times daily as needed for muscle spasms.   08/15/2014 at Unknown time  . Ergocalciferol (VITAMIN D2) 2000 UNITS TABS    08/15/2014 at Unknown time  . feeding supplement, GLUCERNA SHAKE, (GLUCERNA SHAKE) LIQD Take 237 mLs by mouth 3 (three) times daily between meals.  0 08/15/2014 at Unknown time  . folic acid (FOLVITE) 1 MG tablet Take 1 tablet (1 mg  total) by mouth daily.  5 08/15/2014 at Unknown time  . gabapentin (NEURONTIN) 300 MG capsule Take 300 mg by mouth at bedtime.   08/15/2014 at Unknown time  . gabapentin (NEURONTIN) 300 MG capsule Take by mouth.   08/15/2014 at Unknown time  . glipiZIDE (GLUCOTROL XL) 10 MG 24 hr tablet Take by mouth.   08/15/2014 at Unknown time  . guaiFENesin (MUCINEX) 600 MG 12 hr tablet Take 600 mg by mouth 2 (two) times daily as needed for cough.    08/15/2014 at Unknown time  . HYDROmorphone (DILAUDID) 2 MG tablet Take 2 mg by mouth every 6 (six) hours as needed for moderate pain or severe pain.   08/15/2014 at Unknown time  . ipratropium-albuterol (DUONEB) 0.5-2.5 (3) MG/3ML SOLN Take 3 mLs by nebulization every 4 (four) hours as needed. And Q6H PRN   Past Week at Unknown time  . lactulose (CHRONULAC) 10 GM/15ML solution TAKE 30 ML BY MOUTH 2 TIMES DAILY 480 mL 0 08/15/2014 at Unknown time  . lactulose, encephalopathy, (CHRONULAC) 10 GM/15ML SOLN Take 20 g by mouth 2 (two) times daily.    08/15/2014 at Unknown time  . lamoTRIgine (LAMICTAL) 150 MG tablet Take 150 mg by mouth at bedtime.    08/15/2014 at Unknown time  . lamoTRIgine (LAMICTAL) 150 MG tablet Take by mouth.   08/15/2014 at Unknown time  . levothyroxine (SYNTHROID, LEVOTHROID) 75 MCG tablet Take 75 mcg by mouth daily.    08/15/2014 at Unknown time  . Levothyroxine Sodium 75 MCG CAPS Take by mouth.   08/15/2014 at Unknown time  . lisinopril (PRINIVIL,ZESTRIL) 10 MG tablet Take by mouth.   Not Taking at Unknown time  . lisinopril (PRINIVIL,ZESTRIL) 20 MG tablet Take 20 mg by mouth daily.   08/15/2014 at Unknown time  . magnesium oxide (MAG-OX) 400 MG tablet Take 400 mg by mouth 2 (two) times daily.   08/15/2014 at Unknown time  . Magnesium Oxide, Antacid, 500 MG CAPS Take by mouth.   08/15/2014 at Unknown time  . metoCLOPramide (REGLAN) 5 MG tablet Take 5 mg by mouth 4 (four) times daily -  before meals and at bedtime.    08/15/2014 at Unknown time  . metoprolol  succinate (TOPROL-XL) 25 MG 24 hr tablet Take 25 mg by mouth 2 (two) times daily.   08/15/2014 at Unknown time  . metoprolol tartrate (LOPRESSOR) 25 MG tablet Take by mouth.   Not Taking at Unknown time  . montelukast (SINGULAIR) 10 MG tablet Take 10 mg by mouth at bedtime.   08/15/2014 at Unknown time  . Multiple Vitamin (  MULTIVITAMIN WITH MINERALS) TABS tablet Take 1 tablet by mouth daily.   08/15/2014 at Unknown time  . ondansetron (ZOFRAN) 4 MG tablet Take 4 mg by mouth 2 (two) times daily as needed for nausea or vomiting.   Past Week at Unknown time  . pantoprazole (PROTONIX) 40 MG tablet Take 40 mg by mouth daily.   08/15/2014 at Unknown time  . pantoprazole (PROTONIX) 40 MG tablet Take by mouth.   Not Taking at Unknown time  . polyethylene glycol (MIRALAX / GLYCOLAX) packet Take 17 g by mouth daily as needed for mild constipation.   08/15/2014 at Unknown time  . potassium chloride (K-DUR) 10 MEQ tablet Take 10 mEq by mouth at bedtime.   08/15/2014 at Unknown time  . potassium chloride (K-DUR,KLOR-CON) 10 MEQ tablet Take by mouth.   08/15/2014 at Unknown time  . Pramoxine-HC (HYDROCORTISONE ACE-PRAMOXINE) 2.5-1 % CREA Apply 1 application topically 2 (two) times daily. (Patient taking differently: Apply 1 application topically 2 (two) times daily as needed. ) 1 Tube 0 Past Month at Unknown time  . Saxagliptin-Metformin (KOMBIGLYZE XR) 2.06-998 MG TB24 Take by mouth.   08/15/2014 at Unknown time  . Saxagliptin-Metformin 2.06-998 MG TB24 Take 1 tablet by mouth 2 (two) times daily.   08/15/2014 at Unknown time  . sennosides-docusate sodium (SENOKOT-S) 8.6-50 MG tablet Take 2 tablets by mouth at bedtime.    Past Week at Unknown time  . sertraline (ZOLOFT) 100 MG tablet Take 100 mg by mouth at bedtime.    08/15/2014 at Unknown time  . sertraline (ZOLOFT) 100 MG tablet Take by mouth.   Not Taking at Unknown time  . spironolactone (ALDACTONE) 25 MG tablet Take 2 tablets (50 mg total) by mouth daily. 30 tablet 5  08/15/2014 at Unknown time  . thiamine 100 MG tablet Take 1 tablet (100 mg total) by mouth daily.   Past Week at Unknown time  . Vitamin D, Ergocalciferol, (DRISDOL) 50000 UNITS CAPS capsule Take 50,000 Units by mouth every 7 (seven) days. Saturday   Past Week at Unknown time    Current medications: Current Facility-Administered Medications  Medication Dose Route Frequency Provider Last Rate Last Dose  . allopurinol (ZYLOPRIM) tablet 100 mg  100 mg Oral BID Lytle Butte, MD   100 mg at 08/28/14 1011  . ARIPiprazole (ABILIFY) tablet 5 mg  5 mg Oral Daily Lytle Butte, MD   5 mg at 08/27/14 2256  . atorvastatin (LIPITOR) tablet 10 mg  10 mg Oral q1800 Lytle Butte, MD   10 mg at 08/27/14 1709  . baclofen (LIORESAL) tablet 10 mg  10 mg Oral TID Lytle Butte, MD   10 mg at 08/28/14 1012  . clonazePAM (KLONOPIN) tablet 0.5 mg  0.5 mg Oral QHS Lytle Butte, MD   0.5 mg at 08/27/14 2237  . cyclobenzaprine (FLEXERIL) tablet 10 mg  10 mg Oral TID PRN Lytle Butte, MD      . enoxaparin (LOVENOX) injection 40 mg  40 mg Subcutaneous Q24H Fritzi Mandes, MD      . folic acid (FOLVITE) tablet 1 mg  1 mg Oral Daily Lytle Butte, MD   1 mg at 08/28/14 1012  . gabapentin (NEURONTIN) capsule 300 mg  300 mg Oral QHS Lytle Butte, MD   300 mg at 08/27/14 2236  . guaiFENesin (MUCINEX) 12 hr tablet 600 mg  600 mg Oral BID PRN Lytle Butte, MD      .  HYDROmorphone (DILAUDID) tablet 2 mg  2 mg Oral Q6H PRN Lytle Butte, MD      . insulin aspart (novoLOG) injection 0-15 Units  0-15 Units Subcutaneous TID WC Lytle Butte, MD   2 Units at 08/28/14 1159  . insulin aspart (novoLOG) injection 0-5 Units  0-5 Units Subcutaneous QHS Lytle Butte, MD   0 Units at 08/26/14 2215  . ipratropium-albuterol (DUONEB) 0.5-2.5 (3) MG/3ML nebulizer solution 3 mL  3 mL Nebulization Q6H Lytle Butte, MD   3 mL at 08/28/14 0731  . lactulose (CHRONULAC) 10 GM/15ML solution 10 g  10 g Oral BID Lytle Butte, MD   10 g at 08/28/14 1012   . lamoTRIgine (LAMICTAL) tablet 150 mg  150 mg Oral QHS Lytle Butte, MD   150 mg at 08/27/14 2329  . levothyroxine (SYNTHROID, LEVOTHROID) tablet 75 mcg  75 mcg Oral QAC breakfast Lytle Butte, MD   75 mcg at 08/28/14 0815  . lisinopril (PRINIVIL,ZESTRIL) tablet 5 mg  5 mg Oral BID Fritzi Mandes, MD   5 mg at 08/28/14 1012  . magnesium oxide (MAG-OX) tablet 400 mg  400 mg Oral BID Lytle Butte, MD   400 mg at 08/28/14 1013  . saxagliptin HCl (ONGLYZA) tablet 2.5 mg  2.5 mg Oral BID Lytle Butte, MD   2.5 mg at 08/28/14 1013   And  . metFORMIN (GLUCOPHAGE-XR) 24 hr tablet 1,000 mg  1,000 mg Oral BID Lytle Butte, MD   1,000 mg at 08/28/14 1012  . metoCLOPramide (REGLAN) tablet 5 mg  5 mg Oral TID AC & HS Lytle Butte, MD   5 mg at 08/28/14 1159  . metoprolol succinate (TOPROL-XL) 24 hr tablet 25 mg  25 mg Oral BID Lytle Butte, MD   25 mg at 08/28/14 1159  . montelukast (SINGULAIR) tablet 10 mg  10 mg Oral QHS Lytle Butte, MD   10 mg at 08/27/14 2238  . multivitamin with minerals tablet 1 tablet  1 tablet Oral Daily Lytle Butte, MD   1 tablet at 08/28/14 1159  . ondansetron (ZOFRAN) tablet 4 mg  4 mg Oral Q6H PRN Lytle Butte, MD       Or  . ondansetron [facility]) injection 4 mg  4 mg Intravenous Q6H PRN Lytle Butte, MD      . pantoprazole (PROTONIX) EC tablet 40 mg  40 mg Oral Daily Lytle Butte, MD   40 mg at 08/28/14 1012  . phenylephrine-shark liver oil-mineral oil-petrolatum (PREPARATION H) rectal ointment   Rectal BID PRN Fritzi Mandes, MD      . polyethylene glycol (MIRALAX / GLYCOLAX) packet 17 g  17 g Oral Daily PRN Lytle Butte, MD      . senna-docusate (Senokot-S) tablet 2 tablet  2 tablet Oral QHS Lytle Butte, MD   2 tablet at 08/27/14 2237  . sertraline (ZOLOFT) tablet 100 mg  100 mg Oral QHS Lytle Butte, MD   100 mg at 08/27/14 2237  . spironolactone (ALDACTONE) tablet 50 mg  50 mg Oral Daily Lytle Butte, MD   50 mg at 08/28/14 1019  . thiamine (VITAMIN B-1) tablet  100 mg  100 mg Oral Daily Lytle Butte, MD   100 mg at 08/28/14 1013  . witch hazel-glycerin (TUCKS) pad   Topical PRN Fritzi Mandes, MD          Allergies: Allergies  Allergen Reactions  . Iodine Shortness Of Breath  . Latex Anaphylaxis  . Oxycodone Shortness Of Breath  . Percocet [Oxycodone-Acetaminophen] Shortness Of Breath  . Shellfish Allergy Anaphylaxis  . Amitriptyline Other (See Comments)    Mental changes  . Fish Allergy Swelling and Other (See Comments)    "respiratory distress and wheezing"  . Tape Other (See Comments)    Pulls skin off  . Wellbutrin [Bupropion] Other (See Comments)    Mental changes  . Augmentin [Amoxicillin-Pot Clavulanate] Rash  . Codeine Rash  . Cortisone Rash  . Diphenhydramine Rash  . Other Rash    darvocet  . Vicodin [Hydrocodone-Acetaminophen] Rash    dizzy      Past Medical History: Past Medical History  Diagnosis Date  . GERD (gastroesophageal reflux disease)   . Colon polyp   . Cirrhosis of liver   . Hypertension   . Depression   . COPD (chronic obstructive pulmonary disease)   . IBS (irritable bowel syndrome)   . On home oxygen therapy     "2L; 24/7" (05/04/2014)  . Family history of adverse reaction to anesthesia     "my sister doesn't wake up good when she's put deep to sleep"  . High cholesterol   . Heart murmur   . Asthma   . Pneumonia "several times"  . Chronic bronchitis     "get it pretty much q yr"   . Sleep apnea     "can't tolerate CPAP" (05/04/2014)  . Type II diabetes mellitus   . History of blood transfusion     "related to my anemia"  . Anemia   . Iron deficiency anemia   . Headache     "more than a couple times/wk" (05/04/2014)  . Arthritis     "some in my feet" (05/04/2014)  . Chronic lower back pain   . Anxiety   . Acute on chronic diastolic CHF (congestive heart failure) 05/16/2014  . OCD (obsessive compulsive disorder)   . Panic attack   . PTSD (post-traumatic stress disorder)   . Chronic  respiratory failure   . Cirrhosis of liver   . IDA (iron deficiency anemia) 08/14/2014     Past Surgical History: Past Surgical History  Procedure Laterality Date  . Cesarean section  1979  . Cataract extraction w/ intraocular lens  implant, bilateral Bilateral 2005  . Back surgery    . Colonoscopy  2014  . Upper gastrointestinal endoscopy    . Posterior fusion cervical spine  2015    "rebuilt 3 of my neck vertebrae"  . Incision and drainage of wound Right 2009    "leg mauled  by dog"  . Tubal ligation  1979  . Dilation and curettage of uterus    . Peripheral vascular catheterization N/A 08/16/2014    Procedure: PICC Line Insertion;  Surgeon: Algernon Huxley, MD;  Location: [facility];  Service: Cardiovascular;  Laterality: N/A;     Family History: Family History  Problem Relation Age of Onset  . Cancer Father     pancreatic     Social History: History   Social History  . Marital Status: Divorced    Spouse Name: N/A  . Number of Children: N/A  . Years of Education: N/A   Occupational History  . Not on file.   Social History Main Topics  . Smoking status: Current Every Day Smoker -- 0.50 packs/day for 48 years    Types: Cigarettes  . Smokeless tobacco: Never Used  .  Alcohol Use: No  . Drug Use: No  . Sexual Activity: Not Currently   Other Topics Concern  . Not on file   Social History Narrative     Review of Systems: Review of Systems  Constitutional: Positive for malaise/fatigue. Negative for fever, chills and weight loss.  HENT: Negative for ear pain, hearing loss and tinnitus.   Eyes: Negative for blurred vision, double vision and photophobia.  Respiratory: Positive for cough and shortness of breath.   Cardiovascular: Positive for orthopnea and leg swelling. Negative for chest pain.  Gastrointestinal: Positive for abdominal pain. Negative for heartburn, nausea, vomiting and diarrhea.  Genitourinary: Negative for dysuria, urgency and frequency.   Musculoskeletal: Positive for back pain and neck pain.  Skin: Positive for itching. Negative for rash.  Neurological: Positive for weakness. Negative for dizziness, speech change, focal weakness and headaches.  Endo/Heme/Allergies: Negative for environmental allergies. Bruises/bleeds easily.  Psychiatric/Behavioral: Negative for hallucinations. The patient is nervous/anxious.      Vital Signs: Blood pressure 120/48, pulse 79, temperature 97.9 F (36.6 C), temperature source Oral, resp. rate 21, height 5\' 4"  (1.626 m), weight 87.952 kg (193 lb 14.4 oz), SpO2 100 %.  Weight trends: Filed Weights   08/26/14 1828 08/27/14 0500 08/28/14 0500  Weight: 90.266 kg (199 lb) 88.769 kg (195 lb 11.2 oz) 87.952 kg (193 lb 14.4 oz)    Physical Exam: General: NAD, chronically ill appearing  Head: Normocephalic, atraumatic.  Eyes: Anicteric, EOMI  Nose: Mucous membranes moist, not inflammed, nonerythematous.  Throat: Oropharynx nonerythematous, no exudate appreciated.   Neck: No deformities, masses, or tenderness noted.Supple, No carotid Bruits, no JVD.  Lungs:  Normal respiratory effort. Clear to auscultation BL without crackles or wheezes.  Heart: RRR. S1 and S2 normal without gallop, murmur, or rubs.  Abdomen:  Distended, soft, NT, bowel sounds present  Extremities: Bilateral LE's wrapped  Neurologic: A&O X3, Motor strength is 5/5 in the all 4 extremities  Skin: Ecchymoses noted on both UE's    Lab results: Basic Metabolic Panel:  Recent Labs Lab 08/26/14 1834 08/27/14 1739 08/28/14 1021  NA 123* 122* 124*  K 4.8 4.2 4.6  CL 91* 87* 90*  CO2 23 28 29   GLUCOSE 167* 139* 138*  BUN 13 16 16   CREATININE 0.97 1.20* 0.93  CALCIUM 8.0* 7.9* 8.0*    Liver Function Tests:  Recent Labs Lab 08/26/14 1834  AST 65*  ALT 29  ALKPHOS 289*  BILITOT 0.3  PROT 6.5  ALBUMIN 3.1*   No results for input(s): LIPASE, AMYLASE in the last 168 hours. No results for input(s): AMMONIA in the  last 168 hours.  CBC:  Recent Labs Lab 08/26/14 1834  WBC 3.3*  HGB 9.2*  HCT 28.5*  MCV 93.6  PLT 98*    Cardiac Enzymes:  Recent Labs Lab 08/26/14 1834  TROPONINI <0.03    BNP: Invalid input(s): POCBNP  CBG:  Recent Labs Lab 08/27/14 1121 08/27/14 1611 08/27/14 2019 08/28/14 0754 08/28/14 1147  GLUCAP 151* 140* 130* 11 145*    Microbiology: Results for orders placed or performed during the hospital encounter of 07/06/14  Culture, blood (routine x 2)     Status: None   Collection Time: 07/06/14  6:08 PM  Result Value Ref Range Status   Specimen Description BLOOD  Final   Special Requests NONE  Final   Culture NO GROWTH 5 DAYS  Final   Report Status 07/11/2014 FINAL  Final  Culture, blood (routine x 2)  Status: None   Collection Time: 07/06/14  7:00 PM  Result Value Ref Range Status   Specimen Description BLOOD  Final   Special Requests NONE  Final   Culture  Setup Time   Final    ANAEROBIC BOTTLE ONLY GRAM POSITIVE COCCI IN CHAINS CRITICAL RESULT CALLED TO, READ BACK BY AND VERIFIED WITH: BRENDA BECKER AT 75 BY JEF    Culture   Final    STREPTOCOCCUS GROUP G ANAEROBIC BOTTLE ONLY There is no known Penicillin Resistant Beta Streptococcus in the [place] For patients that are Penicillin-allergic, Erythromycin is 85-94% susceptible, and Clindamycin is 80% susceptible.  Contact Microbiology within 7 days if sensitivity testing is  required.      Report Status 07/12/2014 FINAL  Final   Organism ID, Bacteria STREPTOCOCCUS GROUP G  Final      Susceptibility   Streptococcus group g - MIC (ETEST)*    PENICILLIN Value in next row Sensitive      SENSITIVE0.094    VANCOMYCIN Value in next row Sensitive      SENSITIVE0.50    CEFTRIAXONE Value in next row Sensitive      SENSITIVE0.064    LEVOFLOXACIN Value in next row Sensitive      SENSITIVE0.75    * STREPTOCOCCUS GROUP G  Urine culture     Status: None   Collection Time: 07/06/14  7:00 PM  Result  Value Ref Range Status   Specimen Description URINE, CLEAN CATCH  Final   Special Requests NONE  Final   Culture NO GROWTH 2 DAYS  Final   Report Status 07/08/2014 FINAL  Final  Wound culture     Status: None   Collection Time: 07/09/14 11:08 AM  Result Value Ref Range Status   Specimen Description ULCER  Final   Special Requests NONE  Final   Gram Stain FEW WBC SEEN RARE GRAM NEGATIVE RODS   Final   Culture NO GROWTH 4 DAYS  Final   Report Status 07/14/2014 FINAL  Final    Coagulation Studies: No results for input(s): LABPROT, INR in the last 72 hours.  Urinalysis: No results for input(s): COLORURINE, LABSPEC, PHURINE, GLUCOSEU, HGBUR, BILIRUBINUR, KETONESUR, PROTEINUR, UROBILINOGEN, NITRITE, LEUKOCYTESUR in the last 72 hours.  Invalid input(s): APPERANCEUR    Imaging: Dg Chest 2 View  08/26/2014   CLINICAL DATA:  Two day history of productive cough and shortness of breath. Oxygen dependent COPD. Current history of hepatic cirrhosis, diabetes and hypertension. Prior CHF.  EXAM: CHEST  2 VIEW  COMPARISON:  None.  FINDINGS: Cardiac silhouette upper normal in size to slightly enlarged, unchanged. Hilar and mediastinal contours otherwise unremarkable. Development of mild diffuse interstitial pulmonary opacities since the most recent prior examination. No confluent airspace consolidation. No pleural effusions. Mild degenerative changes involving the thoracic spine. Right arm midline catheter tip in the central portion of the right subclavian vein.  IMPRESSION: Diffuse interstitial opacities which may reflect acute bronchitis or mild diffuse interstitial pulmonary edema, superimposed upon COPD. Absence of pleural effusions and current productive cough favors acute bronchitis. No evidence of focal airspace pneumonia.   Electronically Signed   By: Evangeline Dakin M.D.   On: 08/26/2014 20:24      Assessment & Plan: Pt is a 59 y.o. yo female with a PMHx of cirrhosis of the liver, diastolic  heart failure, COPD, hypertension, depression, irritable bowel syndrome hyperlipidemia, sleep apnea, diabetes mellitus type 2 who was admitted to [facility] on 08/26/2014 for evaluation of shortness of breath.  1.  Hyponatremia, likely related to cirrhosis and diastolic heart failure. 2.  Lower extremity edema. 3.  Anemia unspecified. 4.  Cirrhosis of the liver.  Plan:  We were consulted for the evaluation management of hyponatremia in the setting of liver cirrhosis and diastolic heart failure.  The patient's primary complaint was shortness of breath.  At this point in time we recommend continued diuretic therapy. We will check serum and urine electrolytes and also check serum and urine osmolality as well as serum uric acid level.  We will hold off on using aquaretics such as vaprisol and tolvaptan for now.  Recently the patient' sodium has ranged from 123-127.  We are unlikely to be able to normalize the patient's sodium.  Thanks for consult, will follow.

## 2014-08-28 NOTE — Care Management (Signed)
Patient presents from home.  Has a live in caregiver.  Has home 02 from [place].   Patient is currently open to [facility] PT and OT.  Patient says her caregiver will transport

## 2014-08-28 NOTE — Progress Notes (Signed)
[facility] [place] NAME: Samantha Mcmillan    MR#:  [id number]  DATE OF BIRTH:  10/22/1955  SUBJECTIVE:  Came in with SOB. Feels better today Hemorrhoids bleeding some  REVIEW OF SYSTEMS:   Review of Systems  Constitutional: Negative for fever, chills and weight loss.  HENT: Negative for ear discharge, ear pain and nosebleeds.   Eyes: Negative for blurred vision, pain and discharge.  Respiratory: Positive for shortness of breath. Negative for sputum production, wheezing and stridor.   Cardiovascular: Negative for chest pain, palpitations, orthopnea and PND.  Gastrointestinal: Negative for nausea, vomiting, abdominal pain and diarrhea.  Genitourinary: Negative for urgency and frequency.  Musculoskeletal: Negative for back pain and joint pain.  Neurological: Positive for weakness. Negative for sensory change, speech change and focal weakness.  Psychiatric/Behavioral: Negative for depression. The patient is not nervous/anxious.   All other systems reviewed and are negative.  Tolerating Diet:yes Tolerating PT: not needed  DRUG ALLERGIES:   Allergies  Allergen Reactions  . Iodine Shortness Of Breath  . Latex Anaphylaxis  . Oxycodone Shortness Of Breath  . Percocet [Oxycodone-Acetaminophen] Shortness Of Breath  . Shellfish Allergy Anaphylaxis  . Amitriptyline Other (See Comments)    Mental changes  . Fish Allergy Swelling and Other (See Comments)    "respiratory distress and wheezing"  . Tape Other (See Comments)    Pulls skin off  . Wellbutrin [Bupropion] Other (See Comments)    Mental changes  . Augmentin [Amoxicillin-Pot Clavulanate] Rash  . Codeine Rash  . Cortisone Rash  . Diphenhydramine Rash  . Other Rash    darvocet  . Vicodin [Hydrocodone-Acetaminophen] Rash    dizzy    VITALS:  Blood pressure 120/48, pulse 79, temperature 97.9 F (36.6 C), temperature source Oral, resp. rate 21, height 5\' 4"  (1.626 m),  weight 87.952 kg (193 lb 14.4 oz), SpO2 100 %.  PHYSICAL EXAMINATION:   Physical Exam  GENERAL:59 y.o.female currently in no acute distress. However, chronically ill-appearing HEAD: Normocephalic, atraumatic.  EYES: Pupils equal, round, reactive to light. Extraocular muscles intact. No scleral icterus.  MOUTH: Moist mucosal membrane. Dentition intact. No abscess noted.  EAR, NOSE, THROAT: Clear without exudates. No external lesions.  NECK: Supple. No thyromegaly. No nodules. No JVD.  PULMONARY: Bibasilar crackles without wheeze. No use of accessory muscles, Good respiratory effort. good air entry bilaterally CHEST: Nontender to palpation.  CARDIOVASCULAR: S1 and S2. Regular rate and rhythm. No murmurs, rubs, or gallops. 2+ edema. Pedal pulses 2+ bilaterally.  GASTROINTESTINAL: Soft, nontender, positive distention with fluid wave. No masses. Positive bowel sounds. No hepatosplenomegaly.  MUSCULOSKELETAL: No swelling, clubbing, or edema. Range of motion full in all extremities.  NEUROLOGIC: Cranial nerves II through XII are intact. No gross focal neurological deficits. Sensation intact. Reflexes intact.  SKIN: No ulceration, lesions, rashes, or cyanosis. Skin warm and dry. Turgor intact.  PSYCHIATRIC: Mood, affect within normal limits. The patient is awake, alert and oriented x 3. Insight, judgment intact.  LABORATORY PANEL:   CBC  Recent Labs Lab 08/26/14 1834  WBC 3.3*  HGB 9.2*  HCT 28.5*  PLT 98*    Chemistries   Recent Labs Lab 08/26/14 1834  08/28/14 1021  NA 123*  < > 124*  K 4.8  < > 4.6  CL 91*  < > 90*  CO2 23  < > 29  GLUCOSE 167*  < > 138*  BUN 13  < > 16  CREATININE 0.97  < >  0.93  CALCIUM 8.0*  < > 8.0*  AST 65*  --   --   ALT 29  --   --   ALKPHOS 289*  --   --   BILITOT 0.3  --   --   < > = values in this interval not displayed.  Cardiac Enzymes  Recent Labs Lab 08/26/14 1834  TROPONINI <0.03    RADIOLOGY:  Dg Chest 2  View  08/26/2014   CLINICAL DATA:  Two day history of productive cough and shortness of breath. Oxygen dependent COPD. Current history of hepatic cirrhosis, diabetes and hypertension. Prior CHF.  EXAM: CHEST  2 VIEW  COMPARISON:  None.  FINDINGS: Cardiac silhouette upper normal in size to slightly enlarged, unchanged. Hilar and mediastinal contours otherwise unremarkable. Development of mild diffuse interstitial pulmonary opacities since the most recent prior examination. No confluent airspace consolidation. No pleural effusions. Mild degenerative changes involving the thoracic spine. Right arm midline catheter tip in the central portion of the right subclavian vein.  IMPRESSION: Diffuse interstitial opacities which may reflect acute bronchitis or mild diffuse interstitial pulmonary edema, superimposed upon COPD. Absence of pleural effusions and current productive cough favors acute bronchitis. No evidence of focal airspace pneumonia.   Electronically Signed   By: Evangeline Dakin M.D.   On: 08/26/2014 20:24   ASSESSMENT AND PLAN:   59 year old Caucasian female with diastolic congestive heart care, COPD oxygen dependent, cirrhosis of liver presenting with shortness of breath.  1. Acute on chronic diastolic congestive heart failure: -  continue with diuresis, follow daily weights and ins and outs, supplemental oxygen, DuoNeb treatments -received IV lasix 100 mg in the ER. Hold off more lasix Oxygen at baseline -sodium down 2. COPD, chronic respiratory failure: Continue oxygen as well as DuoNeb treatments continue Symbicort 3. Cirrhosis of liver: Continue the spironolactone with concurrent diuresis, check liver function tests including albumin 4. Type 2 diabetes uncomplicated: Hold oral agents at insulin sliding scale 5. Hyperlipidemia unspecified: Lipitor 6. Venous thromboembolism prophylactic: Heparin subcutaneous (as always platelets greater 7.Acute on chronic hyponatremia -pt asymptomatic Suspect  diuresis vs due to CHF and cirrhosis -spoke with Dr Holley Raring  To see pt -123-->122--->124  Case discussed with Care Management/Social Worker. Management plans discussed with the patient, family and they are in agreement.  CODE STATUS:full  DVT Prophylaxis: heparin  TOTAL TIME TAKING CARE OF THIS PATIENT: 35 minutes.  >50% time spent on counselling and coordination of care  POSSIBLE D/C IN 1-2 DAYS, DEPENDING ON CLINICAL CONDITION.   Braley Luckenbaugh M.D on 08/28/2014 at 1:57 PM  Between 7am to 6pm - Pager - [phone]  After 6pm go to [email or web address] - password [email or web address] [facility]  Office  [phone]  CC: Primary care physician; [facility], [place]

## 2014-08-28 NOTE — Progress Notes (Signed)
Spoke with dr. Posey Pronto regarding bp 115/51 and scheduled medication. Per md modify lisinopril to 5mg  po bid and reschedule metoprolol for noon [employer]

## 2014-08-28 NOTE — Progress Notes (Signed)
   08/28/14 0950  Clinical Encounter Type  Visited With Patient and family together  Visit Type Spiritual support;Initial  Referral From Patient  Consult/Referral To Chaplain  Spiritual Encounters  Spiritual Needs Prayer;Emotional  Stress Factors  Patient Stress Factors Exhausted;Health changes;Major life changes;Family relationships  Family Stress Factors Family relationships  Met w/patient and provided pastoral care & prayer. Chap. Damel Querry G. [place]

## 2014-08-28 NOTE — Telephone Encounter (Signed)
Orders faxed to fax number listed

## 2014-08-29 LAB — GLUCOSE, CAPILLARY
GLUCOSE-CAPILLARY: 73 mg/dL (ref 65–99)
GLUCOSE-CAPILLARY: 90 mg/dL (ref 65–99)

## 2014-08-29 MED ORDER — PHENYLEPH-SHARK LIV OIL-MO-PET 0.25-3-14-71.9 % RE OINT: TOPICAL_OINTMENT | Freq: Two times a day (BID) | RECTAL | Status: DC | PRN

## 2014-08-29 MED ORDER — BUMETANIDE 1 MG PO TABS
1.0000 mg | ORAL_TABLET | Freq: Every day | ORAL | Status: DC
Start: 2014-08-29 — End: 2015-04-17

## 2014-08-29 NOTE — Progress Notes (Signed)
[facility]  ROUNDING NOTE   Subjective:   Laying in bed. 2 L O2 [place] UOP 2700 No new sodium today.   Objective:  Vital signs in last 24 hours:  Temp:  [97.8 F (36.6 C)-98.1 F (36.7 C)] 97.9 F (36.6 C) (07/06 0402) Pulse Rate:  [71-79] 71 (07/06 0402) Resp:  [18-22] 18 (07/06 0402) BP: (105-145)/(42-63) 105/42 mmHg (07/06 0402) SpO2:  [96 %-100 %] 100 % (07/06 0402) Weight:  [86.728 kg (191 lb 3.2 oz)] 86.728 kg (191 lb 3.2 oz) (07/06 0402)  Weight change: -1.225 kg (-2 lb 11.2 oz) Filed Weights   08/27/14 0500 08/28/14 0500 08/29/14 0402  Weight: 88.769 kg (195 lb 11.2 oz) 87.952 kg (193 lb 14.4 oz) 86.728 kg (191 lb 3.2 oz)    Intake/Output: I/O last 3 completed shifts: In: -  Out: 2700 [Urine:2700]   Intake/Output this shift:  Total I/O In: 120 [P.O.:120] Out: 0   Physical Exam: General: NAD, laying in bed  Head: Normocephalic, atraumatic. Moist oral mucosal membranes  Eyes: Anicteric, PERRL  Neck: Supple, trachea midline  Lungs:  Bilateral wheezes, 2 L [place] O2  Heart: Regular rate and rhythm  Abdomen:  Soft, nontender,   Extremities:  bilateral wrapping on lower extremities for peripheral edema.  Neurologic: Nonfocal, moving all four extremities  Skin: No lesions       Basic Metabolic Panel:  Recent Labs Lab 08/26/14 1834 08/27/14 1739 08/28/14 1021  NA 123* 122* 124*  K 4.8 4.2 4.6  CL 91* 87* 90*  CO2 23 28 29   GLUCOSE 167* 139* 138*  BUN 13 16 16   CREATININE 0.97 1.20* 0.93  CALCIUM 8.0* 7.9* 8.0*    Liver Function Tests:  Recent Labs Lab 08/26/14 1834  AST 65*  ALT 29  ALKPHOS 289*  BILITOT 0.3  PROT 6.5  ALBUMIN 3.1*   No results for input(s): LIPASE, AMYLASE in the last 168 hours. No results for input(s): AMMONIA in the last 168 hours.  CBC:  Recent Labs Lab 08/26/14 1834  WBC 3.3*  HGB 9.2*  HCT 28.5*  MCV 93.6  PLT 98*    Cardiac Enzymes:  Recent Labs Lab 08/26/14 1834  TROPONINI <0.03     BNP: Invalid input(s): POCBNP  CBG:  Recent Labs Lab 08/28/14 0754 08/28/14 1147 08/28/14 1643 08/28/14 1932 08/29/14 0732  GLUCAP 82 145* 107* 123* 26    Microbiology: Results for orders placed or performed during the hospital encounter of 07/06/14  Culture, blood (routine x 2)     Status: None   Collection Time: 07/06/14  6:08 PM  Result Value Ref Range Status   Specimen Description BLOOD  Final   Special Requests NONE  Final   Culture NO GROWTH 5 DAYS  Final   Report Status 07/11/2014 FINAL  Final  Culture, blood (routine x 2)     Status: None   Collection Time: 07/06/14  7:00 PM  Result Value Ref Range Status   Specimen Description BLOOD  Final   Special Requests NONE  Final   Culture  Setup Time   Final    ANAEROBIC BOTTLE ONLY GRAM POSITIVE COCCI IN CHAINS CRITICAL RESULT CALLED TO, READ BACK BY AND VERIFIED WITH: BRENDA BECKER AT 82 BY JEF    Culture   Final    STREPTOCOCCUS GROUP G ANAEROBIC BOTTLE ONLY There is no known Penicillin Resistant Beta Streptococcus in the [place] For patients that are Penicillin-allergic, Erythromycin is 85-94% susceptible, and Clindamycin is 80% susceptible.  Contact Microbiology within 7 days if sensitivity testing is  required.      Report Status 07/12/2014 FINAL  Final   Organism ID, Bacteria STREPTOCOCCUS GROUP G  Final      Susceptibility   Streptococcus group g - MIC (ETEST)*    PENICILLIN Value in next row Sensitive      SENSITIVE0.094    VANCOMYCIN Value in next row Sensitive      SENSITIVE0.50    CEFTRIAXONE Value in next row Sensitive      SENSITIVE0.064    LEVOFLOXACIN Value in next row Sensitive      SENSITIVE0.75    * STREPTOCOCCUS GROUP G  Urine culture     Status: None   Collection Time: 07/06/14  7:00 PM  Result Value Ref Range Status   Specimen Description URINE, CLEAN CATCH  Final   Special Requests NONE  Final   Culture NO GROWTH 2 DAYS  Final   Report Status 07/08/2014 FINAL  Final  Wound culture      Status: None   Collection Time: 07/09/14 11:08 AM  Result Value Ref Range Status   Specimen Description ULCER  Final   Special Requests NONE  Final   Gram Stain FEW WBC SEEN RARE GRAM NEGATIVE RODS   Final   Culture NO GROWTH 4 DAYS  Final   Report Status 07/14/2014 FINAL  Final    Coagulation Studies: No results for input(s): LABPROT, INR in the last 72 hours.  Urinalysis: No results for input(s): COLORURINE, LABSPEC, PHURINE, GLUCOSEU, HGBUR, BILIRUBINUR, KETONESUR, PROTEINUR, UROBILINOGEN, NITRITE, LEUKOCYTESUR in the last 72 hours.  Invalid input(s): APPERANCEUR    Imaging: No results found.   Medications:     . allopurinol  100 mg Oral BID  . ARIPiprazole  5 mg Oral Daily  . atorvastatin  10 mg Oral q1800  . baclofen  10 mg Oral TID  . bumetanide  1 mg Oral Daily  . clonazePAM  0.5 mg Oral QHS  . enoxaparin (LOVENOX) injection  40 mg Subcutaneous Q24H  . folic acid  1 mg Oral Daily  . gabapentin  300 mg Oral QHS  . insulin aspart  0-15 Units Subcutaneous TID WC  . insulin aspart  0-5 Units Subcutaneous QHS  . ipratropium-albuterol  3 mL Nebulization Q6H  . lactulose  10 g Oral BID  . lamoTRIgine  150 mg Oral QHS  . levothyroxine  75 mcg Oral QAC breakfast  . lisinopril  5 mg Oral BID  . magnesium oxide  400 mg Oral BID  . saxagliptin HCl  2.5 mg Oral BID   And  . metFORMIN  1,000 mg Oral BID  . metoCLOPramide  5 mg Oral TID AC & HS  . metoprolol succinate  25 mg Oral BID  . montelukast  10 mg Oral QHS  . multivitamin with minerals  1 tablet Oral Daily  . pantoprazole  40 mg Oral Daily  . senna-docusate  2 tablet Oral QHS  . sertraline  100 mg Oral QHS  . spironolactone  50 mg Oral Daily  . thiamine  100 mg Oral Daily   cyclobenzaprine, guaiFENesin, HYDROmorphone, ondansetron **OR** ondansetron (ZOFRAN) IV, phenylephrine-shark liver oil-mineral oil-petrolatum, polyethylene glycol, witch hazel-glycerin  Assessment/ Plan:  [name] is a  59 y.o. white female with cirrhosis of the liver, diastolic heart failure, COPD, hypertension, depression, irritable bowel syndrome hyperlipidemia, sleep apnea, diabetes mellitus type 2 who was admitted to [facility] on 08/26/2014   1. Hyponatremia: secondary  to diastolic heart failure and hepatic cirrhosis. Volume status has now improved. Continue bumex and fluid restriction.   2. Edema: improved with bilateral lower extremity wrappings, fluid restriction and bumex.   3. Anemia: Hemoglobin 9.2   4. Proteinuria: subnephrotic range in the past. Serologic work up negative as outpatient. Thought to be due secondary to diabetic nephropathy.    [place], Lurena Nida 7/6/20168:51 AM

## 2014-08-29 NOTE — Progress Notes (Signed)
Discharge instructions along with home medication list and follow up gone over with patient and caregiver. Both verbalized that they understood instructions. picc line remains intacted, dressing to lower legs dry and intact. No rx to give to patient, informed caregiver to pick up rx at pharmacy. Patient to be discharge on o2, personal tank at bedside. Caregiver at bedside to transport patient home [employer]

## 2014-08-29 NOTE — Discharge Summary (Signed)
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 GERD (gastroesophageal reflux disease)   . Colon polyp   . Cirrhosis of liver   . Hypertension   . Depression   . COPD (chronic obstructive pulmonary disease)   . IBS (irritable bowel syndrome)   . On home oxygen therapy     "2L; 24/7" (05/04/2014)  . Family history of adverse reaction to anesthesia     "my sister doesn't wake up good when she's put deep to sleep"  . High cholesterol   . Heart murmur   . Asthma   . Pneumonia "several times"  . Chronic bronchitis     "get it pretty much q yr"   . Sleep apnea     "can't tolerate CPAP" (05/04/2014)  . Type II diabetes mellitus   . History of blood transfusion     "related to my anemia"  . Anemia   . Iron deficiency anemia   . Headache     "more than a couple times/wk" (05/04/2014)  . Arthritis     "some in my feet" (05/04/2014)  . Chronic lower back pain   . Anxiety   . Acute on chronic diastolic CHF (congestive heart failure) 05/16/2014  . OCD (obsessive compulsive disorder)   . Panic attack   . PTSD (post-traumatic stress disorder)   . Chronic respiratory failure   . Cirrhosis of liver   . IDA (iron deficiency anemia) 08/14/2014    HOSPITAL COURSE:   59 year old Caucasian female with diastolic  congestive heart care, COPD oxygen dependent, cirrhosis of liver presenting with shortness of breath.  1. Acute on chronic diastolic congestive heart failure: - continue diuresis now with pt's bumex - follow daily weights and ins and outs, supplemental oxygen, DuoNeb treatments -received IV lasix 100 mg in the ER. Hold off more lasix Oxygen at baseline -sodium down 2. COPD, chronic respiratory failure: Continue oxygen as well as DuoNeb treatments continue Symbicort 3. Cirrhosis of liver: Continue the spironolactone with concurrent diuresis. Pt follows with Dr Allen Norris 4. Type 2 diabetes uncomplicated:  -resume home meds -pt encouraged to eat bedtime snack to avoid hypoglycemia in am 5. Hyperlipidemia unspecified: Lipitor 6. Venous thromboembolism prophylactic: Heparin subcutaneous (as always platelets greater 7.Acute on chronic hyponatremia -pt asymptomatic Suspect diuresis vs due to CHF and cirrhosis -spoke with Dr Holley Raring appreciate input -123-->122--->124 -pt has seen dr Juleen China in the past and will f/u with him as out pt -overall appears at baseline -resume [facility] services -d/c home  DISCHARGE CONDITIONS:  fair  CONSULTS OBTAINED:   Munsoor Lateef, MD  DRUG ALLERGIES:   Allergies  Allergen Reactions  . Iodine Shortness Of Breath  . Latex Anaphylaxis  . Oxycodone Shortness Of Breath  . Percocet [Oxycodone-Acetaminophen] Shortness Of Breath  . Shellfish Allergy Anaphylaxis  . Amitriptyline Other (See Comments)    Mental changes  . Fish Allergy Swelling  and Other (See Comments)    "respiratory distress and wheezing"  . Tape Other (See Comments)    Pulls skin off  . Wellbutrin [Bupropion] Other (See Comments)    Mental changes  . Augmentin [Amoxicillin-Pot Clavulanate] Rash  . Codeine Rash  . Cortisone Rash  . Diphenhydramine Rash  . Other Rash    darvocet  . Vicodin [Hydrocodone-Acetaminophen] Rash    dizzy    DISCHARGE MEDICATIONS:   Current Discharge Medication  List    START taking these medications   Details  !! bumetanide (BUMEX) 1 MG tablet Take 1 tablet (1 mg total) by mouth daily. Qty: 30 tablet, Refills: 0    phenylephrine-shark liver oil-mineral oil-petrolatum (PREPARATION H) 0.25-3-14-71.9 % rectal ointment Place rectally 2 (two) times daily as needed for hemorrhoids. Qty: 30 g, Refills: 0     !! - Potential duplicate medications found. Please discuss with provider.    CONTINUE these medications which have NOT CHANGED   Details  acetaminophen (TYLENOL) 500 MG tablet Take 1,000 mg by mouth every 6 (six) hours as needed for moderate pain or headache.    albuterol (PROVENTIL HFA;VENTOLIN HFA) 108 (90 BASE) MCG/ACT inhaler Inhale 2 puffs into the lungs every 4 (four) hours as needed for wheezing or shortness of breath.    albuterol-ipratropium (COMBIVENT) 18-103 MCG/ACT inhaler Inhale into the lungs.    allopurinol (ZYLOPRIM) 100 MG tablet Take 100 mg by mouth 2 (two) times daily.     Amino Acids-Protein Hydrolys (FEEDING SUPPLEMENT, PRO-STAT SUGAR FREE 64,) LIQD Take 30 mLs by mouth daily.     ARIPiprazole (ABILIFY) 5 MG tablet Take 5 mg by mouth daily.     !! atorvastatin (LIPITOR) 10 MG tablet Take 10 mg by mouth daily at 6 PM.     !! atorvastatin (LIPITOR) 10 MG tablet Take by mouth.    baclofen (LIORESAL) 10 MG tablet Take 10 mg by mouth 3 (three) times daily.    budesonide-formoterol (SYMBICORT) 80-4.5 MCG/ACT inhaler Inhale 2 puffs into the lungs 2 (two) times daily as needed (shortness of breath).     !! bumetanide (BUMEX) 1 MG tablet 1 tablet daily or as directed. Qty: 30 tablet, Refills: 0    !! buprenorphine (BUTRANS - DOSED MCG/HR) 10 MCG/HR PTWK patch Place 1 patch (10 mcg total) onto the skin once a week. Qty: 4 patch, Refills: 0    !! buprenorphine (BUTRANS) 10 MCG/HR PTWK patch Place onto the skin.    !! clonazePAM (KLONOPIN) 0.5 MG tablet Take 1 tablet (0.5 mg total) by mouth daily. Qty: 60 tablet, Refills: 0     !! clonazePAM (KLONOPIN) 0.5 MG tablet Take by mouth.    cyclobenzaprine (FLEXERIL) 10 MG tablet Take 10 mg by mouth 3 (three) times daily as needed for muscle spasms.    Ergocalciferol (VITAMIN D2) 2000 UNITS TABS     feeding supplement, GLUCERNA SHAKE, (GLUCERNA SHAKE) LIQD Take 237 mLs by mouth 3 (three) times daily between meals. Refills: 0    folic acid (FOLVITE) 1 MG tablet Take 1 tablet (1 mg total) by mouth daily. Refills: 5    gabapentin (NEURONTIN) 300 MG capsule Take 300 mg by mouth at bedtime.    glipiZIDE (GLUCOTROL XL) 10 MG 24 hr tablet Take by mouth.    guaiFENesin (MUCINEX) 600 MG 12 hr tablet Take 600 mg by mouth 2 (two) times daily as needed for cough.     HYDROmorphone (DILAUDID) 2 MG tablet Take 2 mg by mouth every 6 (  six) hours as needed for moderate pain or severe pain.    ipratropium-albuterol (DUONEB) 0.5-2.5 (3) MG/3ML SOLN Take 3 mLs by nebulization every 4 (four) hours as needed. And Q6H PRN    !! lactulose (CHRONULAC) 10 GM/15ML solution TAKE 30 ML BY MOUTH 2 TIMES DAILY Qty: 480 mL, Refills: 0   Associated Diagnoses: Constipation, unspecified constipation type    !! lactulose, encephalopathy, (CHRONULAC) 10 GM/15ML SOLN Take 20 g by mouth 2 (two) times daily.     !! lamoTRIgine (LAMICTAL) 150 MG tablet Take 150 mg by mouth at bedtime.     !! lamoTRIgine (LAMICTAL) 150 MG tablet Take by mouth.    levothyroxine (SYNTHROID, LEVOTHROID) 75 MCG tablet Take 75 mcg by mouth daily.     Levothyroxine Sodium 75 MCG CAPS Take by mouth.    !! lisinopril (PRINIVIL,ZESTRIL) 10 MG tablet Take by mouth.    !! lisinopril (PRINIVIL,ZESTRIL) 20 MG tablet Take 20 mg by mouth daily.    magnesium oxide (MAG-OX) 400 MG tablet Take 400 mg by mouth 2 (two) times daily.    Magnesium Oxide, Antacid, 500 MG CAPS Take by mouth.    metoCLOPramide (REGLAN) 5 MG tablet Take 5 mg by mouth 4 (four) times daily -  before meals and at bedtime.     metoprolol succinate  (TOPROL-XL) 25 MG 24 hr tablet Take 25 mg by mouth 2 (two) times daily.    metoprolol tartrate (LOPRESSOR) 25 MG tablet Take by mouth.    montelukast (SINGULAIR) 10 MG tablet Take 10 mg by mouth at bedtime.    Multiple Vitamin (MULTIVITAMIN WITH MINERALS) TABS tablet Take 1 tablet by mouth daily.    ondansetron (ZOFRAN) 4 MG tablet Take 4 mg by mouth 2 (two) times daily as needed for nausea or vomiting.    !! pantoprazole (PROTONIX) 40 MG tablet Take 40 mg by mouth daily.    !! pantoprazole (PROTONIX) 40 MG tablet Take by mouth.    polyethylene glycol (MIRALAX / GLYCOLAX) packet Take 17 g by mouth daily as needed for mild constipation.    potassium chloride (K-DUR) 10 MEQ tablet Take 10 mEq by mouth at bedtime.    potassium chloride (K-DUR,KLOR-CON) 10 MEQ tablet Take by mouth.    Pramoxine-HC (HYDROCORTISONE ACE-PRAMOXINE) 2.5-1 % CREA Apply 1 application topically 2 (two) times daily. Qty: 1 Tube, Refills: 0   Associated Diagnoses: Hemorrhoids, unspecified hemorrhoid type    !! Saxagliptin-Metformin (KOMBIGLYZE XR) 2.06-998 MG TB24 Take by mouth.    !! Saxagliptin-Metformin 2.06-998 MG TB24 Take 1 tablet by mouth 2 (two) times daily.    sennosides-docusate sodium (SENOKOT-S) 8.6-50 MG tablet Take 2 tablets by mouth at bedtime.     !! sertraline (ZOLOFT) 100 MG tablet Take 100 mg by mouth at bedtime.     !! sertraline (ZOLOFT) 100 MG tablet Take by mouth.    spironolactone (ALDACTONE) 25 MG tablet Take 2 tablets (50 mg total) by mouth daily. Qty: 30 tablet, Refills: 5    thiamine 100 MG tablet Take 1 tablet (100 mg total) by mouth daily.    Vitamin D, Ergocalciferol, (DRISDOL) 50000 UNITS CAPS capsule Take 50,000 Units by mouth every 7 (seven) days. Saturday     !! - Potential duplicate medications found. Please discuss with provider.    STOP taking these medications     cephALEXin (KEFLEX) 500 MG capsule         If you experience worsening of your admission  symptoms, develop shortness of breath, life  threatening emergency, suicidal or homicidal thoughts you must seek medical attention immediately by calling 911 or calling your MD immediately  if symptoms less severe.  You Must read complete instructions/literature along with all the possible adverse reactions/side effects for all the Medicines you take and that have been prescribed to you. Take any new Medicines after you have completely understood and accept all the possible adverse reactions/side effects.   Please note  You were cared for by a hospitalist during your hospital stay. If you have any questions about your discharge medications or the care you received while you were in the hospital after you are discharged, you can call the unit and asked to speak with the hospitalist on call if the hospitalist that took care of you is not available. Once you are discharged, your primary care physician will handle any further medical issues. Please note that NO REFILLS for any discharge medications will be authorized once you are discharged, as it is imperative that you return to your primary care physician (or establish a relationship with a primary care physician if you do not have one) for your aftercare needs so that they can reassess your need for medications and monitor your lab values. Today   SUBJECTIVE  No new complaints. Not hungry this am although trying to eat banana  VITAL SIGNS:  Blood pressure 105/42, pulse 71, temperature 97.9 F (36.6 C), temperature source Oral, resp. rate 18, height 5\' 4"  (1.626 m), weight 86.728 kg (191 lb 3.2 oz), SpO2 100 %.  I/O:   Intake/Output Summary (Last 24 hours) at 08/29/14 0900 Last data filed at 08/29/14 0817  Gross per 24 hour  Intake    120 ml  Output   2700 ml  Net  -2580 ml    PHYSICAL EXAMINATION:  GENERAL:58 y.o.female currently in no acute distress. However, chronically ill-appearing HEAD: Normocephalic, atraumatic.  EYES: Pupils equal,  round, reactive to light. Extraocular muscles intact. No scleral icterus.  MOUTH: Moist mucosal membrane. Dentition intact. No abscess noted.  EAR, NOSE, THROAT: Clear without exudates. No external lesions.  NECK: Supple. No thyromegaly. No nodules. No JVD.  PULMONARY: Bibasilar crackles without wheeze. No use of accessory muscles, Good respiratory effort. good air entry bilaterally CHEST: Nontender to palpation.  CARDIOVASCULAR: S1 and S2. Regular rate and rhythm. No murmurs, rubs, or gallops. 2+ edema. Pedal pulses 2+ bilaterally.  GASTROINTESTINAL: Soft, nontender, positive distention with fluid wave. No masses. Positive bowel sounds. No hepatosplenomegaly.  MUSCULOSKELETAL: No swelling, clubbing, or edema. Range of motion full in all extremities.  NEUROLOGIC: Cranial nerves II through XII are intact. No gross focal neurological deficits. Sensation intact. Reflexes intact.  SKIN: No ulceration, lesions, rashes, or cyanosis. Skin warm and dry. Turgor intact.  PSYCHIATRIC: Mood, affect within normal limits.   patient is awake, alert and oriented x 3. Insight, judgment intact. DATA REVIEW:   CBC   Recent Labs Lab 08/26/14 1834  WBC 3.3*  HGB 9.2*  HCT 28.5*  PLT 98*    Chemistries   Recent Labs Lab 08/26/14 1834  08/28/14 1021  NA 123*  < > 124*  K 4.8  < > 4.6  CL 91*  < > 90*  CO2 23  < > 29  GLUCOSE 167*  < > 138*  BUN 13  < > 16  CREATININE 0.97  < > 0.93  CALCIUM 8.0*  < > 8.0*  AST 65*  --   --   ALT 29  --   --  ALKPHOS 289*  --   --   BILITOT 0.3  --   --   < > = values in this interval not displayed.  Microbiology Results   No results found for this or any previous visit (from the past 240 hour(s)).  RADIOLOGY:  No results found.   Management plans discussed with the patient, family and they are in agreement.  CODE STATUS:     Code Status Orders        Start     Ordered   08/26/14 2201  Full code   Continuous     08/26/14 2201       TOTAL TIME TAKING CARE OF THIS PATIENT: 26minutes.    Samantha Mcmillan M.D on 08/29/2014 at 9:00 AM  Between 7am to 6pm - Pager - [phone] After 6pm go to [email or web address] - password [email or web address] [facility]  Office  [phone]  CC: Primary care physician; [facility], MD

## 2014-08-29 NOTE — Discharge Instructions (Signed)
Use your oxygen as before °

## 2014-09-06 ENCOUNTER — Inpatient Hospital Stay
Admission: EM | Admit: 2014-09-06 | Discharge: 2014-09-08 | DRG: 640 | Disposition: A | Discharge status: Home / Self Care | Payer: Medicare Other | Attending: Internal Medicine | Admitting: Internal Medicine

## 2014-09-06 ENCOUNTER — Inpatient Hospital Stay (HOSPITAL_COMMUNITY)
Admit: 2014-09-06 | Discharge: 2014-09-06 | Disposition: A | Discharge status: Home / Self Care | Payer: Medicare Other | Attending: Internal Medicine | Admitting: Internal Medicine

## 2014-09-06 ENCOUNTER — Encounter: Payer: Self-pay | Admitting: Emergency Medicine

## 2014-09-06 ENCOUNTER — Emergency Department: Payer: Medicare Other

## 2014-09-06 DIAGNOSIS — F410 Panic disorder [episodic paroxysmal anxiety] without agoraphobia: Secondary | ICD-10-CM | POA: Diagnosis present

## 2014-09-06 DIAGNOSIS — J9610 Chronic respiratory failure, unspecified whether with hypoxia or hypercapnia: Secondary | ICD-10-CM | POA: Diagnosis present

## 2014-09-06 DIAGNOSIS — I5033 Acute on chronic diastolic (congestive) heart failure: Secondary | ICD-10-CM | POA: Diagnosis present

## 2014-09-06 DIAGNOSIS — Z9841 Cataract extraction status, right eye: Secondary | ICD-10-CM

## 2014-09-06 DIAGNOSIS — I10 Essential (primary) hypertension: Secondary | ICD-10-CM | POA: Diagnosis present

## 2014-09-06 DIAGNOSIS — Z961 Presence of intraocular lens: Secondary | ICD-10-CM | POA: Diagnosis present

## 2014-09-06 DIAGNOSIS — J45909 Unspecified asthma, uncomplicated: Secondary | ICD-10-CM | POA: Diagnosis present

## 2014-09-06 DIAGNOSIS — G4730 Sleep apnea, unspecified: Secondary | ICD-10-CM | POA: Diagnosis present

## 2014-09-06 DIAGNOSIS — Z888 Allergy status to other drugs, medicaments and biological substances status: Secondary | ICD-10-CM | POA: Diagnosis not present

## 2014-09-06 DIAGNOSIS — I350 Nonrheumatic aortic (valve) stenosis: Secondary | ICD-10-CM | POA: Diagnosis not present

## 2014-09-06 DIAGNOSIS — E780 Pure hypercholesterolemia: Secondary | ICD-10-CM | POA: Diagnosis present

## 2014-09-06 DIAGNOSIS — Z91013 Allergy to seafood: Secondary | ICD-10-CM | POA: Diagnosis not present

## 2014-09-06 DIAGNOSIS — Z885 Allergy status to narcotic agent status: Secondary | ICD-10-CM | POA: Diagnosis not present

## 2014-09-06 DIAGNOSIS — Z9842 Cataract extraction status, left eye: Secondary | ICD-10-CM

## 2014-09-06 DIAGNOSIS — Z7951 Long term (current) use of inhaled steroids: Secondary | ICD-10-CM | POA: Diagnosis not present

## 2014-09-06 DIAGNOSIS — Z9981 Dependence on supplemental oxygen: Secondary | ICD-10-CM

## 2014-09-06 DIAGNOSIS — K7460 Unspecified cirrhosis of liver: Secondary | ICD-10-CM | POA: Diagnosis present

## 2014-09-06 DIAGNOSIS — Z716 Tobacco abuse counseling: Secondary | ICD-10-CM | POA: Diagnosis present

## 2014-09-06 DIAGNOSIS — M1990 Unspecified osteoarthritis, unspecified site: Secondary | ICD-10-CM | POA: Diagnosis present

## 2014-09-06 DIAGNOSIS — Z791 Long term (current) use of non-steroidal anti-inflammatories (NSAID): Secondary | ICD-10-CM

## 2014-09-06 DIAGNOSIS — E871 Hypo-osmolality and hyponatremia: Secondary | ICD-10-CM | POA: Diagnosis present

## 2014-09-06 DIAGNOSIS — D61818 Other pancytopenia: Secondary | ICD-10-CM | POA: Diagnosis present

## 2014-09-06 DIAGNOSIS — F17210 Nicotine dependence, cigarettes, uncomplicated: Secondary | ICD-10-CM | POA: Diagnosis present

## 2014-09-06 DIAGNOSIS — Z809 Family history of malignant neoplasm, unspecified: Secondary | ICD-10-CM

## 2014-09-06 DIAGNOSIS — Z86010 Personal history of colonic polyps: Secondary | ICD-10-CM | POA: Diagnosis not present

## 2014-09-06 DIAGNOSIS — K219 Gastro-esophageal reflux disease without esophagitis: Secondary | ICD-10-CM | POA: Diagnosis present

## 2014-09-06 DIAGNOSIS — K589 Irritable bowel syndrome without diarrhea: Secondary | ICD-10-CM | POA: Diagnosis present

## 2014-09-06 DIAGNOSIS — E039 Hypothyroidism, unspecified: Secondary | ICD-10-CM | POA: Diagnosis present

## 2014-09-06 DIAGNOSIS — F42 Obsessive-compulsive disorder: Secondary | ICD-10-CM | POA: Diagnosis present

## 2014-09-06 DIAGNOSIS — F4310 Post-traumatic stress disorder, unspecified: Secondary | ICD-10-CM | POA: Diagnosis present

## 2014-09-06 DIAGNOSIS — Z981 Arthrodesis status: Secondary | ICD-10-CM | POA: Diagnosis not present

## 2014-09-06 DIAGNOSIS — J449 Chronic obstructive pulmonary disease, unspecified: Secondary | ICD-10-CM | POA: Diagnosis present

## 2014-09-06 DIAGNOSIS — Z91040 Latex allergy status: Secondary | ICD-10-CM

## 2014-09-06 DIAGNOSIS — Z794 Long term (current) use of insulin: Secondary | ICD-10-CM

## 2014-09-06 LAB — CBC WITH DIFFERENTIAL/PLATELET
Basophils Absolute: 0 10*3/uL (ref 0–0.1)
Basophils Relative: 0 %
Eosinophils Absolute: 0 10*3/uL (ref 0–0.7)
Eosinophils Relative: 1 %
HEMATOCRIT: 25.4 % — AB (ref 35.0–47.0)
HEMOGLOBIN: 8.4 g/dL — AB (ref 12.0–16.0)
Lymphocytes Relative: 12 %
Lymphs Abs: 0.3 10*3/uL — ABNORMAL LOW (ref 1.0–3.6)
MCH: 29.4 pg (ref 26.0–34.0)
MCHC: 33.3 g/dL (ref 32.0–36.0)
MCV: 88.3 fL (ref 80.0–100.0)
MONO ABS: 0.1 10*3/uL — AB (ref 0.2–0.9)
MONOS PCT: 5 %
NEUTROS ABS: 2.2 10*3/uL (ref 1.4–6.5)
Neutrophils Relative %: 82 %
Platelets: 115 10*3/uL — ABNORMAL LOW (ref 150–440)
RBC: 2.87 MIL/uL — ABNORMAL LOW (ref 3.80–5.20)
RDW: 16 % — ABNORMAL HIGH (ref 11.5–14.5)
WBC: 2.6 10*3/uL — AB (ref 3.6–11.0)

## 2014-09-06 LAB — BASIC METABOLIC PANEL
Anion gap: 5 (ref 5–15)
Anion gap: 9 (ref 5–15)
Anion gap: 8 (ref 5–15)
BUN: 11 mg/dL (ref 6–20)
BUN: 11 mg/dL (ref 6–20)
BUN: 12 mg/dL (ref 6–20)
CALCIUM: 7.7 mg/dL — AB (ref 8.9–10.3)
CHLORIDE: 82 mmol/L — AB (ref 101–111)
CHLORIDE: 87 mmol/L — AB (ref 101–111)
CO2: 28 mmol/L (ref 22–32)
CO2: 30 mmol/L (ref 22–32)
CO2: 29 mmol/L (ref 22–32)
CREATININE: 0.7 mg/dL (ref 0.44–1.00)
Calcium: 7.6 mg/dL — ABNORMAL LOW (ref 8.9–10.3)
Calcium: 8.2 mg/dL — ABNORMAL LOW (ref 8.9–10.3)
Chloride: 83 mmol/L — ABNORMAL LOW (ref 101–111)
Creatinine, Ser: 0.75 mg/dL (ref 0.44–1.00)
Creatinine, Ser: 0.82 mg/dL (ref 0.44–1.00)
GFR calc Af Amer: >60 mL/min (ref >60)
GFR calc non Af Amer: >60 mL/min (ref >60)
GFR calc non Af Amer: >60 mL/min (ref >60)
GLUCOSE: 132 mg/dL — AB (ref 65–99)
Glucose, Bld: 124 mg/dL — ABNORMAL HIGH (ref 65–99)
Glucose, Bld: 129 mg/dL — ABNORMAL HIGH (ref 65–99)
Potassium: 3.9 mmol/L (ref 3.5–5.1)
Potassium: 3.9 mmol/L (ref 3.5–5.1)
Potassium: 3.5 mmol/L (ref 3.5–5.1)
SODIUM: 121 mmol/L — AB (ref 135–145)
Sodium: 116 mmol/L — CL (ref 135–145)
Sodium: 124 mmol/L — ABNORMAL LOW (ref 135–145)

## 2014-09-06 LAB — BRAIN NATRIURETIC PEPTIDE: B NATRIURETIC PEPTIDE 5: 99 pg/mL (ref 0.0–100.0)

## 2014-09-06 LAB — GLUCOSE, CAPILLARY
Glucose-Capillary: 80 mg/dL (ref 65–99)
Glucose-Capillary: 139 mg/dL — ABNORMAL HIGH (ref 65–99)

## 2014-09-06 MED ORDER — IPRATROPIUM-ALBUTEROL 0.5-2.5 (3) MG/3ML IN SOLN: 3.0000 mL | RESPIRATORY_TRACT | Status: DC | PRN

## 2014-09-06 MED ORDER — ATORVASTATIN CALCIUM 10 MG PO TABS
10.0000 mg | ORAL_TABLET | Freq: Every day | ORAL | Status: DC
  Administered 2014-09-06 – 2014-09-07 (×2): 10 mg via ORAL
  Filled 2014-09-06 (×2): qty 1

## 2014-09-06 MED ORDER — ALBUTEROL SULFATE (2.5 MG/3ML) 0.083% IN NEBU: 2.5000 mg | INHALATION_SOLUTION | RESPIRATORY_TRACT | Status: DC | PRN

## 2014-09-06 MED ORDER — HYDROMORPHONE HCL 2 MG PO TABS: 2.0000 mg | ORAL_TABLET | Freq: Four times a day (QID) | ORAL | Status: DC | PRN

## 2014-09-06 MED ORDER — ACETAMINOPHEN 500 MG PO TABS
1000.0000 mg | ORAL_TABLET | Freq: Four times a day (QID) | ORAL | Status: DC | PRN
  Administered 2014-09-07: 17:00:00 1000 mg via ORAL
  Filled 2014-09-06: qty 2

## 2014-09-06 MED ORDER — METOPROLOL SUCCINATE ER 25 MG PO TB24
25.0000 mg | ORAL_TABLET | Freq: Two times a day (BID) | ORAL | Status: DC
  Administered 2014-09-06 – 2014-09-08 (×4): 25 mg via ORAL
  Filled 2014-09-06 (×4): qty 1

## 2014-09-06 MED ORDER — ONDANSETRON HCL 4 MG PO TABS: 4.0000 mg | ORAL_TABLET | Freq: Four times a day (QID) | ORAL | Status: DC | PRN

## 2014-09-06 MED ORDER — LEVOTHYROXINE SODIUM 75 MCG PO TABS
75.0000 ug | ORAL_TABLET | Freq: Every day | ORAL | Status: DC
  Administered 2014-09-07 – 2014-09-08 (×2): 75 ug via ORAL
  Filled 2014-09-06 (×2): qty 1

## 2014-09-06 MED ORDER — INSULIN ASPART 100 UNIT/ML ~~LOC~~ SOLN: 0.0000 [IU] | Freq: Every day | SUBCUTANEOUS | Status: DC

## 2014-09-06 MED ORDER — HEPARIN SODIUM (PORCINE) 5000 UNIT/ML IJ SOLN
5000.0000 [IU] | Freq: Three times a day (TID) | INTRAMUSCULAR | Status: DC
  Administered 2014-09-06 – 2014-09-08 (×6): 5000 [IU] via SUBCUTANEOUS
  Filled 2014-09-06 (×6): qty 1

## 2014-09-06 MED ORDER — CLONAZEPAM 0.5 MG PO TABS
0.5000 mg | ORAL_TABLET | Freq: Every day | ORAL | Status: DC
  Administered 2014-09-06 – 2014-09-07 (×2): 0.5 mg via ORAL
  Filled 2014-09-06 (×4): qty 1

## 2014-09-06 MED ORDER — PANTOPRAZOLE SODIUM 40 MG PO TBEC
40.0000 mg | DELAYED_RELEASE_TABLET | Freq: Every day | ORAL | Status: DC
  Administered 2014-09-06 – 2014-09-08 (×3): 40 mg via ORAL
  Filled 2014-09-06 (×3): qty 1

## 2014-09-06 MED ORDER — ALLOPURINOL 100 MG PO TABS
100.0000 mg | ORAL_TABLET | Freq: Two times a day (BID) | ORAL | Status: DC
  Administered 2014-09-06 – 2014-09-08 (×4): 100 mg via ORAL
  Filled 2014-09-06 (×4): qty 1

## 2014-09-06 MED ORDER — BUDESONIDE-FORMOTEROL FUMARATE 80-4.5 MCG/ACT IN AERO
2.0000 | INHALATION_SPRAY | Freq: Two times a day (BID) | RESPIRATORY_TRACT | Status: DC | PRN
  Administered 2014-09-07: 08:00:00 2 via RESPIRATORY_TRACT
  Filled 2014-09-06: qty 6.9

## 2014-09-06 MED ORDER — FUROSEMIDE 10 MG/ML IJ SOLN
40.0000 mg | Freq: Two times a day (BID) | INTRAMUSCULAR | Status: DC
  Administered 2014-09-06 – 2014-09-07 (×2): 40 mg via INTRAVENOUS
  Filled 2014-09-06 (×2): qty 4

## 2014-09-06 MED ORDER — LAMOTRIGINE 25 MG PO TABS
150.0000 mg | ORAL_TABLET | Freq: Every day | ORAL | Status: DC
  Administered 2014-09-06 – 2014-09-07 (×2): 150 mg via ORAL
  Filled 2014-09-06 (×2): qty 2

## 2014-09-06 MED ORDER — POLYETHYLENE GLYCOL 3350 17 G PO PACK: 17.0000 g | PACK | Freq: Every day | ORAL | Status: DC | PRN

## 2014-09-06 MED ORDER — PRO-STAT SUGAR FREE PO LIQD
30.0000 mL | Freq: Every day | ORAL | Status: DC
  Administered 2014-09-08: 09:00:00 30 mL via ORAL

## 2014-09-06 MED ORDER — ONDANSETRON HCL 4 MG/2ML IJ SOLN: 4.0000 mg | Freq: Four times a day (QID) | INTRAMUSCULAR | Status: DC | PRN

## 2014-09-06 MED ORDER — SENNOSIDES-DOCUSATE SODIUM 8.6-50 MG PO TABS
2.0000 | ORAL_TABLET | Freq: Every day | ORAL | Status: DC
  Administered 2014-09-06 – 2014-09-07 (×2): 2 via ORAL
  Filled 2014-09-06 (×2): qty 2

## 2014-09-06 MED ORDER — SERTRALINE HCL 50 MG PO TABS
100.0000 mg | ORAL_TABLET | Freq: Every day | ORAL | Status: DC
  Administered 2014-09-06 – 2014-09-07 (×2): 100 mg via ORAL
  Filled 2014-09-06 (×2): qty 2

## 2014-09-06 MED ORDER — INSULIN ASPART 100 UNIT/ML ~~LOC~~ SOLN
0.0000 [IU] | Freq: Three times a day (TID) | SUBCUTANEOUS | Status: DC
Start: 2014-09-06 — End: 2014-09-08
  Administered 2014-09-07: 13:00:00 3 [IU] via SUBCUTANEOUS
  Filled 2014-09-06: qty 3

## 2014-09-06 MED ORDER — GLUCERNA SHAKE PO LIQD: 237.0000 mL | Freq: Three times a day (TID) | ORAL | Status: DC

## 2014-09-06 MED ORDER — MONTELUKAST SODIUM 10 MG PO TABS
10.0000 mg | ORAL_TABLET | Freq: Every day | ORAL | Status: DC
  Administered 2014-09-06 – 2014-09-07 (×2): 10 mg via ORAL
  Filled 2014-09-06 (×2): qty 1

## 2014-09-06 MED ORDER — LISINOPRIL 20 MG PO TABS
20.0000 mg | ORAL_TABLET | Freq: Every day | ORAL | Status: DC
  Administered 2014-09-06 – 2014-09-08 (×3): 20 mg via ORAL
  Filled 2014-09-06 (×3): qty 1

## 2014-09-06 MED ORDER — MAGNESIUM OXIDE 400 (241.3 MG) MG PO TABS
400.0000 mg | ORAL_TABLET | Freq: Two times a day (BID) | ORAL | Status: DC
  Administered 2014-09-06 – 2014-09-08 (×4): 400 mg via ORAL
  Filled 2014-09-06 (×4): qty 1

## 2014-09-06 MED ORDER — ALBUTEROL SULFATE HFA 108 (90 BASE) MCG/ACT IN AERS: 2.0000 | INHALATION_SPRAY | RESPIRATORY_TRACT | Status: DC | PRN

## 2014-09-06 MED ORDER — ADULT MULTIVITAMIN W/MINERALS CH
1.0000 | ORAL_TABLET | Freq: Every day | ORAL | Status: DC
  Administered 2014-09-06 – 2014-09-08 (×3): 1 via ORAL
  Filled 2014-09-06 (×3): qty 1

## 2014-09-06 MED ORDER — POTASSIUM CHLORIDE ER 10 MEQ PO TBCR
10.0000 meq | EXTENDED_RELEASE_TABLET | Freq: Every day | ORAL | Status: DC
  Administered 2014-09-06 – 2014-09-07 (×2): 10 meq via ORAL
  Filled 2014-09-06 (×5): qty 1

## 2014-09-06 MED ORDER — CYCLOBENZAPRINE HCL 10 MG PO TABS: 10.0000 mg | ORAL_TABLET | Freq: Three times a day (TID) | ORAL | Status: DC | PRN

## 2014-09-06 MED ORDER — METOCLOPRAMIDE HCL 5 MG PO TABS
5.0000 mg | ORAL_TABLET | Freq: Three times a day (TID) | ORAL | Status: DC
  Administered 2014-09-06 – 2014-09-08 (×7): 5 mg via ORAL
  Filled 2014-09-06 (×7): qty 1

## 2014-09-06 MED ORDER — GUAIFENESIN ER 600 MG PO TB12
600.0000 mg | ORAL_TABLET | Freq: Two times a day (BID) | ORAL | Status: DC | PRN
Start: 2014-09-06 — End: 2014-09-08

## 2014-09-06 MED ORDER — NICOTINE 7 MG/24HR TD PT24
7.0000 mg | MEDICATED_PATCH | Freq: Every day | TRANSDERMAL | Status: DC
  Administered 2014-09-06 – 2014-09-08 (×3): 7 mg via TRANSDERMAL
  Filled 2014-09-06 (×3): qty 1

## 2014-09-06 MED ORDER — LACTULOSE 10 GM/15ML PO SOLN
10.0000 g | Freq: Two times a day (BID) | ORAL | Status: DC
  Administered 2014-09-06 – 2014-09-08 (×4): 10 g via ORAL
  Filled 2014-09-06 (×4): qty 30

## 2014-09-06 MED ORDER — MAGNESIUM OXIDE 400 MG PO TABS: 400.0000 mg | ORAL_TABLET | Freq: Two times a day (BID) | ORAL | Status: DC

## 2014-09-06 MED ORDER — SPIRONOLACTONE 100 MG PO TABS
50.0000 mg | ORAL_TABLET | Freq: Every day | ORAL | Status: DC
  Administered 2014-09-06 – 2014-09-08 (×3): 50 mg via ORAL
  Filled 2014-09-06 (×3): qty 1

## 2014-09-06 MED ORDER — GABAPENTIN 100 MG PO CAPS
300.0000 mg | ORAL_CAPSULE | Freq: Every day | ORAL | Status: DC
  Administered 2014-09-06 – 2014-09-07 (×2): 300 mg via ORAL
  Filled 2014-09-06 (×2): qty 3

## 2014-09-06 MED ORDER — SODIUM CHLORIDE 3 % IV SOLN
INTRAVENOUS | Status: DC
  Administered 2014-09-06: 20 mL/h via INTRAVENOUS
  Filled 2014-09-06 (×2): qty 500

## 2014-09-06 MED ORDER — ARIPIPRAZOLE 10 MG PO TABS
5.0000 mg | ORAL_TABLET | Freq: Every day | ORAL | Status: DC
  Administered 2014-09-06 – 2014-09-07 (×2): 5 mg via ORAL
  Filled 2014-09-06 (×4): qty 1

## 2014-09-06 MED ORDER — VITAMIN B-1 100 MG PO TABS
100.0000 mg | ORAL_TABLET | Freq: Every day | ORAL | Status: DC
  Administered 2014-09-06 – 2014-09-08 (×3): 100 mg via ORAL
  Filled 2014-09-06 (×3): qty 1

## 2014-09-06 MED ORDER — SENNA-DOCUSATE SODIUM 8.6-50 MG PO TABS: 2.0000 | ORAL_TABLET | Freq: Every day | ORAL | Status: DC

## 2014-09-06 NOTE — ED Notes (Signed)
Pt comes into ED as instructed by her PCP Dr. Rosario Jacks due to low sodium levels.  Patient also states she is having headaches, shortness of breath, and swelling of her legs.  Patients Dr. Hulen Skains her this morning to tell her that it was low and that she should come to the ER.

## 2014-09-06 NOTE — Plan of Care (Signed)
Problem: Discharge Progression Outcomes Goal: Discharge plan in place and appropriate Outcome: Progressing Pt lives at home, she states her ex-husband is her care-taker that helps her. She has home care services set up, PT, OT. Pt has a hx of DM, CHF, Cirrhosis of liver, htn, copd, asthma, and IDA. She is on home medications.  Goal: Other Discharge Outcomes/Goals Outcome: Progressing Pt alert and oriented, 98% on 2.5L O2 per [facility]. Double lumen picc was present on admission, dressing changed. Una boots on bilateral lower extremities, pt is a moderate fall risk, does not complain of pain at this time. IV fluids infusing, will continue to monitor.

## 2014-09-06 NOTE — ED Provider Notes (Signed)
[facility] Emergency Department Provider Note    ____________________________________________  Time seen: 1225  I have reviewed the triage vital signs and the nursing notes.   HISTORY  Chief Complaint Shortness of Breath; Leg Swelling; and Headache   History limited by: Not Limited   HPI Samantha Mcmillan is a 59 y.o. female who was sent to the emergency department today by her doctor because of concerns for low sodium found on blood work done yesterday.She states that she was recently hospitalized because of his CHF exacerbation but during the hospitalization she was found to be hyponatremic. The blood work done yesterday was a follow-up blood draw for her hyponatremia. In addition the patient does also complain of feeling worse for the past 2 days. This includes worsening shortness of breath and general fatigue. Patient has not noticed any fevers but states she has had some chills.   Past Medical History  Diagnosis Date  . GERD (gastroesophageal reflux disease)   . Colon polyp   . Cirrhosis of liver   . Hypertension   . Depression   . COPD (chronic obstructive pulmonary disease)   . IBS (irritable bowel syndrome)   . On home oxygen therapy     "2L; 24/7" (05/04/2014)  . Family history of adverse reaction to anesthesia     "my sister doesn't wake up good when she's put deep to sleep"  . High cholesterol   . Heart murmur   . Asthma   . Pneumonia "several times"  . Chronic bronchitis     "get it pretty much q yr"   . Sleep apnea     "can't tolerate CPAP" (05/04/2014)  . Type II diabetes mellitus   . History of blood transfusion     "related to my anemia"  . Anemia   . Iron deficiency anemia   . Headache     "more than a couple times/wk" (05/04/2014)  . Arthritis     "some in my feet" (05/04/2014)  . Chronic lower back pain   . Anxiety   . Acute on chronic diastolic CHF (congestive heart failure) 05/16/2014  . OCD (obsessive compulsive  disorder)   . Panic attack   . PTSD (post-traumatic stress disorder)   . Chronic respiratory failure   . Cirrhosis of liver   . IDA (iron deficiency anemia) 08/14/2014    Patient Active Problem List   Diagnosis Date Noted  . IDA (iron deficiency anemia) 08/14/2014  . Ascites 07/25/2014  . Cirrhosis, nonalcoholic 16/11/9602  . Cellulitis of both lower extremities 07/07/2014  . Cellulitis and abscess of lower extremity 07/07/2014  . Metabolic & toxic encephalopathy, multifactorial 05/18/2014  . Encephalopathy, hepatic 05/18/2014  . Polypharmacy 05/18/2014  . Failure to thrive in adult 05/16/2014  . Other pancytopenia 05/16/2014  . Hepatic cirrhosis 05/16/2014  . Protein-calorie malnutrition, severe 05/16/2014  . Advanced COPD 05/16/2014  . Acute on chronic diastolic CHF (congestive heart failure) 05/16/2014  . Chronic back pain 05/16/2014  . Weakness 05/15/2014  . Hyponatremia 05/15/2014  . HCAP (healthcare-associated pneumonia) 05/09/2014  . Urinary retention 05/09/2014  . DM2 (diabetes mellitus, type 2) 05/09/2014  . AKI (acute kidney injury) 05/05/2014  . Hypothyroidism 05/05/2014    Past Surgical History  Procedure Laterality Date  . Cesarean section  1979  . Cataract extraction w/ intraocular lens  implant, bilateral Bilateral 2005  . Back surgery    . Colonoscopy  2014  . Upper gastrointestinal endoscopy    . Posterior fusion cervical  spine  2015    "rebuilt 3 of my neck vertebrae"  . Incision and drainage of wound Right 2009    "leg mauled  by dog"  . Tubal ligation  1979  . Dilation and curettage of uterus    . Peripheral vascular catheterization N/A 08/16/2014    Procedure: PICC Line Insertion;  Surgeon: Algernon Huxley, MD;  Location: [facility];  Service: Cardiovascular;  Laterality: N/A;    Current Outpatient Rx  Name  Route  Sig  Dispense  Refill  . acetaminophen (TYLENOL) 500 MG tablet   Oral   Take 1,000 mg by mouth every 6 (six) hours as needed  for moderate pain or headache.         . albuterol (PROVENTIL HFA;VENTOLIN HFA) 108 (90 BASE) MCG/ACT inhaler   Inhalation   Inhale 2 puffs into the lungs every 4 (four) hours as needed for wheezing or shortness of breath.         Marland Kitchen albuterol-ipratropium (COMBIVENT) 18-103 MCG/ACT inhaler   Inhalation   Inhale into the lungs.         Marland Kitchen allopurinol (ZYLOPRIM) 100 MG tablet   Oral   Take 100 mg by mouth 2 (two) times daily.          . Amino Acids-Protein Hydrolys (FEEDING SUPPLEMENT, PRO-STAT SUGAR FREE 64,) LIQD   Oral   Take 30 mLs by mouth daily.          . ARIPiprazole (ABILIFY) 5 MG tablet   Oral   Take 5 mg by mouth daily.          Marland Kitchen atorvastatin (LIPITOR) 10 MG tablet   Oral   Take 10 mg by mouth daily at 6 PM.          . baclofen (LIORESAL) 10 MG tablet   Oral   Take 10 mg by mouth 3 (three) times daily.         . budesonide-formoterol (SYMBICORT) 80-4.5 MCG/ACT inhaler   Inhalation   Inhale 2 puffs into the lungs 2 (two) times daily as needed (shortness of breath).          . bumetanide (BUMEX) 1 MG tablet   Oral   Take 1 tablet (1 mg total) by mouth daily.   30 tablet   0   . buprenorphine (BUTRANS - DOSED MCG/HR) 10 MCG/HR PTWK patch   Transdermal   Place 1 patch (10 mcg total) onto the skin once a week.   4 patch   0   . clonazePAM (KLONOPIN) 0.5 MG tablet   Oral   Take 1 tablet (0.5 mg total) by mouth daily. Patient taking differently: Take 0.5 mg by mouth at bedtime.    60 tablet   0   . cyclobenzaprine (FLEXERIL) 10 MG tablet   Oral   Take 10 mg by mouth 3 (three) times daily as needed for muscle spasms.         . Ergocalciferol (VITAMIN D2) 2000 UNITS TABS               . feeding supplement, GLUCERNA SHAKE, (GLUCERNA SHAKE) LIQD   Oral   Take 237 mLs by mouth 3 (three) times daily between meals.      0   . folic acid (FOLVITE) 1 MG tablet   Oral   Take 1 tablet (1 mg total) by mouth daily.      5   .  gabapentin (NEURONTIN) 300 MG capsule  Oral   Take 300 mg by mouth at bedtime.         Marland Kitchen glipiZIDE (GLUCOTROL XL) 10 MG 24 hr tablet   Oral   Take by mouth.         Marland Kitchen guaiFENesin (MUCINEX) 600 MG 12 hr tablet   Oral   Take 600 mg by mouth 2 (two) times daily as needed for cough.          Marland Kitchen HYDROmorphone (DILAUDID) 2 MG tablet   Oral   Take 2 mg by mouth every 6 (six) hours as needed for moderate pain or severe pain.         Marland Kitchen ipratropium-albuterol (DUONEB) 0.5-2.5 (3) MG/3ML SOLN   Nebulization   Take 3 mLs by nebulization every 4 (four) hours as needed. And Q6H PRN         . lactulose (CHRONULAC) 10 GM/15ML solution      TAKE 30 ML BY MOUTH 2 TIMES DAILY   480 mL   0   . lamoTRIgine (LAMICTAL) 150 MG tablet   Oral   Take 150 mg by mouth at bedtime.          Marland Kitchen levothyroxine (SYNTHROID, LEVOTHROID) 75 MCG tablet   Oral   Take 75 mcg by mouth daily.          Marland Kitchen lisinopril (PRINIVIL,ZESTRIL) 20 MG tablet   Oral   Take 20 mg by mouth daily.         . magnesium oxide (MAG-OX) 400 MG tablet   Oral   Take 400 mg by mouth 2 (two) times daily.         . metoCLOPramide (REGLAN) 5 MG tablet   Oral   Take 5 mg by mouth 4 (four) times daily -  before meals and at bedtime.          . metoprolol succinate (TOPROL-XL) 25 MG 24 hr tablet   Oral   Take 25 mg by mouth 2 (two) times daily.         . montelukast (SINGULAIR) 10 MG tablet   Oral   Take 10 mg by mouth at bedtime.         . Multiple Vitamin (MULTIVITAMIN WITH MINERALS) TABS tablet   Oral   Take 1 tablet by mouth daily.         . ondansetron (ZOFRAN) 4 MG tablet   Oral   Take 4 mg by mouth 2 (two) times daily as needed for nausea or vomiting.         . pantoprazole (PROTONIX) 40 MG tablet   Oral   Take 40 mg by mouth daily.         . phenylephrine-shark liver oil-mineral oil-petrolatum (PREPARATION H) 0.25-3-14-71.9 % rectal ointment   Rectal   Place rectally 2 (two) times daily  as needed for hemorrhoids.   30 g   0   . polyethylene glycol (MIRALAX / GLYCOLAX) packet   Oral   Take 17 g by mouth daily as needed for mild constipation.         . potassium chloride (K-DUR) 10 MEQ tablet   Oral   Take 10 mEq by mouth at bedtime.         . Pramoxine-HC (HYDROCORTISONE ACE-PRAMOXINE) 2.5-1 % CREA   Apply externally   Apply 1 application topically 2 (two) times daily. Patient taking differently: Apply 1 application topically 2 (two) times daily as needed.    1 Tube   0   .  Saxagliptin-Metformin 2.06-998 MG TB24   Oral   Take 1 tablet by mouth 2 (two) times daily.         . sennosides-docusate sodium (SENOKOT-S) 8.6-50 MG tablet   Oral   Take 2 tablets by mouth at bedtime.          . sertraline (ZOLOFT) 100 MG tablet   Oral   Take 100 mg by mouth at bedtime.          Marland Kitchen spironolactone (ALDACTONE) 25 MG tablet   Oral   Take 2 tablets (50 mg total) by mouth daily.   30 tablet   5   . thiamine 100 MG tablet   Oral   Take 1 tablet (100 mg total) by mouth daily.         . Vitamin D, Ergocalciferol, (DRISDOL) 50000 UNITS CAPS capsule   Oral   Take 50,000 Units by mouth every 7 (seven) days. Saturday           Allergies Iodine; Latex; Oxycodone; Percocet; Shellfish allergy; Amitriptyline; Fish allergy; Tape; Wellbutrin; Augmentin; Codeine; Cortisone; Diphenhydramine; Other; and Vicodin  Family History  Problem Relation Age of Onset  . Cancer Father     pancreatic    Social History History  Substance Use Topics  . Smoking status: Current Every Day Smoker -- 0.50 packs/day for 48 years    Types: Cigarettes  . Smokeless tobacco: Never Used  . Alcohol Use: No    Review of Systems  Constitutional: Negative for fever. Cardiovascular: Negative for chest pain. Respiratory: Positive for shortness of breath. Gastrointestinal: Negative for abdominal pain, vomiting and diarrhea. Genitourinary: Negative for dysuria. Musculoskeletal:  Negative for back pain. Skin: Negative for rash. Neurological: Negative for headaches, focal weakness or numbness.   10-point ROS otherwise negative.  ____________________________________________   PHYSICAL EXAM:  VITAL SIGNS: ED Triage Vitals  Enc Vitals Group     BP 09/06/14 1141 140/59 mmHg     Pulse Rate 09/06/14 1141 80     Resp 09/06/14 1141 18     Temp 09/06/14 1141 98.1 F (36.7 C)     Temp Source 09/06/14 1141 Oral     SpO2 09/06/14 1141 100 %     Weight 09/06/14 1141 190 lb (86.183 kg)     Height 09/06/14 1141 5\' 4"  (1.626 m)     Head Cir --      Peak Flow --      Pain Score 09/06/14 1201 0   Constitutional: Alert and oriented. Well appearing and in no distress. Eyes: Conjunctivae are normal. PERRL. Normal extraocular movements. ENT   Head: Normocephalic and atraumatic.   Nose: No congestion/rhinnorhea.   Mouth/Throat: Mucous membranes are moist.   Neck: No stridor. Hematological/Lymphatic/Immunilogical: No cervical lymphadenopathy. Cardiovascular: Normal rate, regular rhythm.  No murmurs, rubs, or gallops. Respiratory: Normal respiratory effort without tachypnea nor retractions. Breath sounds are clear and equal bilaterally. No wheezes/rales/rhonchi. Gastrointestinal: Soft and nontender. No distention.  Genitourinary: Deferred Musculoskeletal: Normal range of motion in all extremities. No joint effusions.  Lower extremities wrapped. PICC in right upper arm. Neurologic:  Normal speech and language. No gross focal neurologic deficits are appreciated. Speech is normal.  Skin:  Skin is warm, dry and intact. No rash noted. Psychiatric: Mood and affect are normal. Speech and behavior are normal. Patient exhibits appropriate insight and judgment.  ____________________________________________    LABS (pertinent positives/negatives)  Labs Reviewed  BASIC METABOLIC PANEL - Abnormal; Notable for the following:    Sodium 116 (*)  Chloride 83 (*)     Glucose, Bld 124 (*)    Calcium 7.6 (*)    All other components within normal limits  CBC WITH DIFFERENTIAL/PLATELET - Abnormal; Notable for the following:    WBC 2.6 (*)    RBC 2.87 (*)    Hemoglobin 8.4 (*)    HCT 25.4 (*)    RDW 16.0 (*)    Platelets 115 (*)    Lymphs Abs 0.3 (*)    Monocytes Absolute 0.1 (*)    All other components within normal limits  BRAIN NATRIURETIC PEPTIDE     ____________________________________________   EKG  None  ____________________________________________    RADIOLOGY  Chest x-ray  IMPRESSION: 1. Findings concerning for mild pulmonary edema. ____________________________________________   PROCEDURES  Procedure(s) performed: None  Critical Care performed: Yes, see critical care note(s)   CRITICAL CARE Performed by: Nance Pear   Total critical care time: 30  Critical care time was exclusive of separately billable procedures and treating other patients.  Critical care was necessary to treat or prevent imminent or life-threatening deterioration.  Critical care was time spent personally by me on the following activities: development of treatment plan with patient and/or surrogate as well as nursing, discussions with consultants, evaluation of patient's response to treatment, examination of patient, obtaining history from patient or surrogate, ordering and performing treatments and interventions, ordering and review of laboratory studies, ordering and review of radiographic studies, pulse oximetry and re-evaluation of patient's condition.   ____________________________________________   INITIAL IMPRESSION / ASSESSMENT AND PLAN / ED COURSE  Pertinent labs & imaging results that were available during my care of the patient were reviewed by me and considered in my medical decision making (see chart for details).  Patient presents to the emergency department today because of concern for hyponatremia. Lab work here does show  the patient has severe hyponatremia. Additionally however patient has some mild pulmonary edema seen on her chest x-ray. Thus I'm concerned about giving her large volumes of sodium chloride to correct her hyponatremia. Hence I will initiate treatment with hyper tonic saline. I discussed with patient findings of the lab work in need for admission.  ____________________________________________   FINAL CLINICAL IMPRESSION(S) / ED DIAGNOSES  Final diagnoses:  Hyponatremia     Nance Pear, MD 09/06/14 1343

## 2014-09-06 NOTE — H&P (Addendum)
[facility] NAME: Samantha Mcmillan    MR#:  [id number]  DATE OF BIRTH:  1955-09-03  DATE OF ADMISSION:  09/06/2014  PRIMARY CARE PHYSICIAN: Casilda Carls, MD   REQUESTING/REFERRING PHYSICIAN: Nance Pear, MD  CHIEF COMPLAINT:   Chief Complaint  Patient presents with  . Shortness of Breath  . Leg Swelling  . Headache  worsening shortness of breath and generalized weakness 2-3 days.  HISTORY OF PRESENT ILLNESS:  Samantha Mcmillan  is a 59 y.o. female with a known history of diastolic congestive heart failure, cirrhosis of liver, COPD oxygen dependent 2 L nasal cannula.  The patient has had worsening shortness of breath and generalized weakness 2-3 days. In addition, she complains of headache and dizziness. She was just discharged from the hospital after treatment of CHF one week ago. She said she can wait about 9 pounds for the past one week. She has chronic hyponatremia but today's sodium level is low at 116. Chest x-ray show pulmonary edema. The patient denies any fever or chills. She denies any chest pain, palpitation or orthopnea or nocturnal dyspnea.  PAST MEDICAL HISTORY:   Past Medical History  Diagnosis Date  . GERD (gastroesophageal reflux disease)   . Colon polyp   . Cirrhosis of liver   . Hypertension   . Depression   . COPD (chronic obstructive pulmonary disease)   . IBS (irritable bowel syndrome)   . On home oxygen therapy     "2L; 24/7" (05/04/2014)  . Family history of adverse reaction to anesthesia     "my sister doesn't wake up good when she's put deep to sleep"  . High cholesterol   . Heart murmur   . Asthma   . Pneumonia "several times"  . Chronic bronchitis     "get it pretty much q yr"   . Sleep apnea     "can't tolerate CPAP" (05/04/2014)  . Type II diabetes mellitus   . History of blood transfusion     "related to my anemia"  . Anemia   . Iron deficiency anemia   . Headache     "more  than a couple times/wk" (05/04/2014)  . Arthritis     "some in my feet" (05/04/2014)  . Chronic lower back pain   . Anxiety   . Acute on chronic diastolic CHF (congestive heart failure) 05/16/2014  . OCD (obsessive compulsive disorder)   . Panic attack   . PTSD (post-traumatic stress disorder)   . Chronic respiratory failure   . Cirrhosis of liver   . IDA (iron deficiency anemia) 08/14/2014    PAST SURGICAL HISTORY:   Past Surgical History  Procedure Laterality Date  . Cesarean section  1979  . Cataract extraction w/ intraocular lens  implant, bilateral Bilateral 2005  . Back surgery    . Colonoscopy  2014  . Upper gastrointestinal endoscopy    . Posterior fusion cervical spine  2015    "rebuilt 3 of my neck vertebrae"  . Incision and drainage of wound Right 2009    "leg mauled  by dog"  . Tubal ligation  1979  . Dilation and curettage of uterus    . Peripheral vascular catheterization N/A 08/16/2014    Procedure: PICC Line Insertion;  Surgeon: Algernon Huxley, MD;  Location: [facility];  Service: Cardiovascular;  Laterality: N/A;    SOCIAL HISTORY:   History  Substance Use Topics  . Smoking status: Current Every  Day Smoker -- 0.50 packs/day for 48 years    Types: Cigarettes  . Smokeless tobacco: Never Used  . Alcohol Use: No    FAMILY HISTORY:   Family History  Problem Relation Age of Onset  . Cancer Father     pancreatic    DRUG ALLERGIES:   Allergies  Allergen Reactions  . Fish Allergy Anaphylaxis and Swelling  . Iodine Shortness Of Breath  . Latex Anaphylaxis  . Percocet [Oxycodone-Acetaminophen] Shortness Of Breath  . Shellfish Allergy Anaphylaxis and Swelling  . Amitriptyline Other (See Comments)    Reaction:  Mental changes   . Tape Other (See Comments)    Pt states that it pulls her skin off.   . Wellbutrin [Bupropion] Other (See Comments)    Reaction:  Mental changes   . Augmentin [Amoxicillin-Pot Clavulanate] Rash  . Codeine Rash  .  Cortisone Rash  . Diphenhydramine Rash  . Other Rash and Other (See Comments)    Pt states that she is allergic to Darvocet.   . Vicodin [Hydrocodone-Acetaminophen] Rash and Other (See Comments)    Reaction:  Dizziness      REVIEW OF SYSTEMS:  CONSTITUTIONAL: No fever, but has headache, dizziness and generalized weakness. Weight gain. EYES: No blurred or double vision.  EARS, NOSE, AND THROAT: No tinnitus or ear pain.  RESPIRATORY: Has cough, shortness of breath, no wheezing or hemoptysis.  CARDIOVASCULAR: No chest pain, orthopnea, but has leg edema.  GASTROINTESTINAL: No nausea, vomiting, diarrhea or abdominal pain.  GENITOURINARY: No dysuria, hematuria.  ENDOCRINE: No polyuria, nocturia,  HEMATOLOGY: No anemia, easy bruising or bleeding SKIN: No rash or lesion. MUSCULOSKELETAL: No joint pain or arthritis.   NEUROLOGIC: No tingling, numbness, no focal weakness.  PSYCHIATRY: No anxiety or depression.   MEDICATIONS AT HOME:   Prior to Admission medications   Medication Sig Start Date End Date Taking? Authorizing Provider  bumetanide (BUMEX) 1 MG tablet Take 1 tablet (1 mg total) by mouth daily. 08/29/14  Yes Fritzi Mandes, MD  ondansetron (ZOFRAN) 4 MG tablet Take 4 mg by mouth every 8 (eight) hours as needed for nausea or vomiting.    Yes Historical Provider, MD  Vitamin D, Ergocalciferol, (DRISDOL) 50000 UNITS CAPS capsule Take 50,000 Units by mouth every 7 (seven) days. Pt takes on Saturday.   Yes Historical Provider, MD  acetaminophen (TYLENOL) 500 MG tablet Take 1,000 mg by mouth every 6 (six) hours as needed for moderate pain or headache.    Historical Provider, MD  albuterol (PROVENTIL HFA;VENTOLIN HFA) 108 (90 BASE) MCG/ACT inhaler Inhale 2 puffs into the lungs every 4 (four) hours as needed for wheezing or shortness of breath.    Historical Provider, MD  albuterol-ipratropium (COMBIVENT) 18-103 MCG/ACT inhaler Inhale into the lungs.    Historical Provider, MD  allopurinol  (ZYLOPRIM) 100 MG tablet Take 100 mg by mouth 2 (two) times daily.     Historical Provider, MD  Amino Acids-Protein Hydrolys (FEEDING SUPPLEMENT, PRO-STAT SUGAR FREE 64,) LIQD Take 30 mLs by mouth daily.     Historical Provider, MD  ARIPiprazole (ABILIFY) 5 MG tablet Take 5 mg by mouth daily.     Historical Provider, MD  atorvastatin (LIPITOR) 10 MG tablet Take 10 mg by mouth daily at 6 PM.     Historical Provider, MD  baclofen (LIORESAL) 10 MG tablet Take 10 mg by mouth 3 (three) times daily.    Historical Provider, MD  budesonide-formoterol (SYMBICORT) 80-4.5 MCG/ACT inhaler Inhale 2 puffs into the lungs  2 (two) times daily as needed (shortness of breath).     Historical Provider, MD  buprenorphine (BUTRANS - DOSED MCG/HR) 10 MCG/HR PTWK patch Place 1 patch (10 mcg total) onto the skin once a week. 05/18/14   Venetia Maxon Rama, MD  clonazePAM (KLONOPIN) 0.5 MG tablet Take 1 tablet (0.5 mg total) by mouth daily. Patient taking differently: Take 0.5 mg by mouth at bedtime.  05/22/14   Estill Dooms, MD  cyclobenzaprine (FLEXERIL) 10 MG tablet Take 10 mg by mouth 3 (three) times daily as needed for muscle spasms.    Historical Provider, MD  Ergocalciferol (VITAMIN D2) 2000 UNITS TABS     Historical Provider, MD  feeding supplement, GLUCERNA SHAKE, (GLUCERNA SHAKE) LIQD Take 237 mLs by mouth 3 (three) times daily between meals. 05/18/14   Venetia Maxon Rama, MD  folic acid (FOLVITE) 1 MG tablet Take 1 tablet (1 mg total) by mouth daily. 07/25/14   Lucilla Lame, MD  gabapentin (NEURONTIN) 300 MG capsule Take 300 mg by mouth at bedtime.    Historical Provider, MD  glipiZIDE (GLUCOTROL XL) 10 MG 24 hr tablet Take by mouth.    Historical Provider, MD  guaiFENesin (MUCINEX) 600 MG 12 hr tablet Take 600 mg by mouth 2 (two) times daily as needed for cough.     Historical Provider, MD  HYDROmorphone (DILAUDID) 2 MG tablet Take 2 mg by mouth every 6 (six) hours as needed for moderate pain or severe pain.    Historical  Provider, MD  ipratropium-albuterol (DUONEB) 0.5-2.5 (3) MG/3ML SOLN Take 3 mLs by nebulization every 4 (four) hours as needed. And Q6H PRN    Historical Provider, MD  lactulose (CHRONULAC) 10 GM/15ML solution TAKE 30 ML BY MOUTH 2 TIMES DAILY 07/06/14   Dallas Schimke, MD  lamoTRIgine (LAMICTAL) 150 MG tablet Take 150 mg by mouth at bedtime.     Historical Provider, MD  levothyroxine (SYNTHROID, LEVOTHROID) 75 MCG tablet Take 75 mcg by mouth daily.     Historical Provider, MD  lisinopril (PRINIVIL,ZESTRIL) 20 MG tablet Take 20 mg by mouth daily.    Historical Provider, MD  magnesium oxide (MAG-OX) 400 MG tablet Take 400 mg by mouth 2 (two) times daily.    Historical Provider, MD  metoCLOPramide (REGLAN) 5 MG tablet Take 5 mg by mouth 4 (four) times daily -  before meals and at bedtime.     Historical Provider, MD  metoprolol succinate (TOPROL-XL) 25 MG 24 hr tablet Take 25 mg by mouth 2 (two) times daily.    Historical Provider, MD  montelukast (SINGULAIR) 10 MG tablet Take 10 mg by mouth at bedtime.    Historical Provider, MD  Multiple Vitamin (MULTIVITAMIN WITH MINERALS) TABS tablet Take 1 tablet by mouth daily. 05/18/14   Christina P Rama, MD  pantoprazole (PROTONIX) 40 MG tablet Take 40 mg by mouth daily.    Historical Provider, MD  phenylephrine-shark liver oil-mineral oil-petrolatum (PREPARATION H) 0.25-3-14-71.9 % rectal ointment Place rectally 2 (two) times daily as needed for hemorrhoids. 08/29/14   Fritzi Mandes, MD  polyethylene glycol (MIRALAX / GLYCOLAX) packet Take 17 g by mouth daily as needed for mild constipation.    Historical Provider, MD  potassium chloride (K-DUR) 10 MEQ tablet Take 10 mEq by mouth at bedtime.    Historical Provider, MD  Pramoxine-HC (HYDROCORTISONE ACE-PRAMOXINE) 2.5-1 % CREA Apply 1 application topically 2 (two) times daily. Patient taking differently: Apply 1 application topically 2 (two) times daily as needed.  03/15/14   Seeplaputhur Robinette Haines, MD   Saxagliptin-Metformin 2.06-998 MG TB24 Take 1 tablet by mouth 2 (two) times daily.    Historical Provider, MD  sennosides-docusate sodium (SENOKOT-S) 8.6-50 MG tablet Take 2 tablets by mouth at bedtime.     Historical Provider, MD  sertraline (ZOLOFT) 100 MG tablet Take 100 mg by mouth at bedtime.     Historical Provider, MD  spironolactone (ALDACTONE) 25 MG tablet Take 2 tablets (50 mg total) by mouth daily. 07/25/14 07/25/15  Lucilla Lame, MD  thiamine 100 MG tablet Take 1 tablet (100 mg total) by mouth daily. 05/18/14   Venetia Maxon Rama, MD      VITAL SIGNS:  Blood pressure 140/59, pulse 80, temperature 98.1 F (36.7 C), temperature source Oral, resp. rate 18, height 5\' 4"  (1.626 m), weight 86.183 kg (190 lb), SpO2 100 %.  PHYSICAL EXAMINATION:  GENERAL:  59 y.o.-year-old patient lying in the bed with no acute distress.  EYES: Pupils equal, round, reactive to light and accommodation. No scleral icterus. Extraocular muscles intact.  HEENT: Head atraumatic, normocephalic. Oropharynx and nasopharynx clear. Dry oral mucosa. NECK:  Supple, no jugular venous distention. No thyroid enlargement, no tenderness.  LUNGS: Normal breath sounds bilaterally, no wheezing, but has bilateral rales. No use of accessory muscles of respiration.  CARDIOVASCULAR: S1, S2 normal. No murmurs, rubs, or gallops.  ABDOMEN: Soft, nontender, nondistended. Bowel sounds present. No organomegaly or mass.  EXTREMITIES: Bilateral lower extremity edema with dressing, no cyanosis, or clubbing.  NEUROLOGIC: Cranial nerves II through XII are intact. Muscle strength 4/5 in all extremities. Sensation intact. Gait not checked.  PSYCHIATRIC: The patient is alert and oriented x 3.  SKIN: No obvious rash, lesion, or ulcer. But has chronic bruises on chest and bilateral upper extremities.  LABORATORY PANEL:   CBC  Recent Labs Lab 09/06/14 1215  WBC 2.6*  HGB 8.4*  HCT 25.4*  PLT 115*    ------------------------------------------------------------------------------------------------------------------  Chemistries   Recent Labs Lab 09/06/14 1215  NA 116*  K 3.9  CL 83*  CO2 28  GLUCOSE 124*  BUN 11  CREATININE 0.70  CALCIUM 7.6*   ------------------------------------------------------------------------------------------------------------------  Cardiac Enzymes No results for input(s): TROPONINI in the last 168 hours. ------------------------------------------------------------------------------------------------------------------  RADIOLOGY:  Dg Chest Portable 1 View  09/06/2014   CLINICAL DATA:  Low sodium levels, headache, shortness of breath  EXAM: PORTABLE CHEST - 1 VIEW  COMPARISON:  08/26/2014  FINDINGS: No focal consolidation or pneumothorax. Bilateral perihilar interstitial thickening and indistinctness of the central pulmonary vasculature. No significant pleural effusion. Stable cardiomediastinal silhouette. No acute osseous abnormality. Posterior cervical fusion is noted.  IMPRESSION: 1. Findings concerning for mild pulmonary edema.   Electronically Signed   By: Kathreen Devoid   On: 09/06/2014 13:10    EKG:   Orders placed or performed during the hospital encounter of 08/26/14  . ED EKG (<29mins upon arrival to the ED)  . ED EKG (<55mins upon arrival to the ED)  . EKG 12-Lead  . EKG 12-Lead  . EKG    IMPRESSION AND PLAN:   Severe hyponatremia Acute on chronic diastolic CHF Pancytopenia Liver cirrhosis Chronic respiratory failure on home oxygen 2 L. COPD Diabetes2 Tobacco abuse.  The patient will be admitted to medical floor. I will start CHF protocol. Hold Bumex, start a Lasix IV every 12 hours and continue spironolactone. In addition, I will get echocardiogram and a follow-up BMP.  Continue oxygen by nasal cannular and nebulizer treatment.  For diabetes,  I will hold glipizide and start a sliding scale. Pancytopenia, chronic, possible  due to liver cirrhosis. Follow-up CBC. For tobacco abuse, smoking cessation was counseled for 3 minutes and I will give nicotine patch. The patient is on lactulose for liver cirrhosis.  All the records are reviewed and case discussed with ED provider. Management plans discussed with the patient, caregiver and they are in agreement.  CODE STATUS: Full code  TOTAL TIME TAKING CARE OF THIS PATIENT: 56 minutes.    Demetrios Loll M.D on 09/06/2014 at 2:03 PM  Between 7am to 6pm - Pager - [phone]  After 6pm go to [email or web address] - password [email or web address] [facility]  Office  [phone]  CC: Primary care physician; [facility], MD

## 2014-09-06 NOTE — ED Notes (Signed)
Notified Dr. Archie Balboa of sodium value.

## 2014-09-07 LAB — CBC
HCT: 23.7 % — ABNORMAL LOW (ref 35.0–47.0)
HEMOGLOBIN: 7.8 g/dL — AB (ref 12.0–16.0)
MCH: 29.2 pg (ref 26.0–34.0)
MCHC: 33 g/dL (ref 32.0–36.0)
MCV: 88.5 fL (ref 80.0–100.0)
Platelets: 124 10*3/uL — ABNORMAL LOW (ref 150–440)
RBC: 2.68 MIL/uL — AB (ref 3.80–5.20)
RDW: 16.4 % — ABNORMAL HIGH (ref 11.5–14.5)
WBC: 2.4 10*3/uL — ABNORMAL LOW (ref 3.6–11.0)

## 2014-09-07 LAB — BASIC METABOLIC PANEL
ANION GAP: 9 (ref 5–15)
ANION GAP: 8 (ref 5–15)
BUN: 14 mg/dL (ref 6–20)
BUN: 14 mg/dL (ref 6–20)
CALCIUM: 8.1 mg/dL — AB (ref 8.9–10.3)
CO2: 31 mmol/L (ref 22–32)
CO2: 31 mmol/L (ref 22–32)
CREATININE: 0.83 mg/dL (ref 0.44–1.00)
Calcium: 8.2 mg/dL — ABNORMAL LOW (ref 8.9–10.3)
Chloride: 89 mmol/L — ABNORMAL LOW (ref 101–111)
Chloride: 89 mmol/L — ABNORMAL LOW (ref 101–111)
Creatinine, Ser: 0.84 mg/dL (ref 0.44–1.00)
GFR calc Af Amer: >60 mL/min (ref >60)
GLUCOSE: 94 mg/dL (ref 65–99)
Glucose, Bld: 153 mg/dL — ABNORMAL HIGH (ref 65–99)
POTASSIUM: 3.4 mmol/L — AB (ref 3.5–5.1)
Potassium: 3.6 mmol/L (ref 3.5–5.1)
SODIUM: 128 mmol/L — AB (ref 135–145)
Sodium: 129 mmol/L — ABNORMAL LOW (ref 135–145)

## 2014-09-07 LAB — GLUCOSE, CAPILLARY
GLUCOSE-CAPILLARY: 132 mg/dL — AB (ref 65–99)
Glucose-Capillary: 89 mg/dL (ref 65–99)
Glucose-Capillary: 224 mg/dL — ABNORMAL HIGH (ref 65–99)
Glucose-Capillary: 81 mg/dL (ref 65–99)

## 2014-09-07 LAB — URIC ACID: URIC ACID, SERUM: 4.1 mg/dL (ref 2.3–6.6)

## 2014-09-07 LAB — TSH: TSH: 2.174 u[IU]/mL (ref 0.350–4.500)

## 2014-09-07 MED ORDER — GLUCERNA SHAKE PO LIQD
237.0000 mL | Freq: Two times a day (BID) | ORAL | Status: DC
  Administered 2014-09-07 – 2014-09-08 (×2): 237 mL via ORAL

## 2014-09-07 NOTE — Consult Note (Signed)
WOC wound consult note Reason for Consult: BIlateral Unnas boots.   Wound type: Venous insufficiency.  Partial thickness ulcers to bilateral lower legs.  Pressure Ulcer POA: N/A Measurement:Left dorsal calf 1 cm x 0.5 cm x 0.1 cm ruptured blister, per patient.  Right lateral calf 0.5 cm x 0.5 cm x 0.1 cm ruptured blister Wound bed:100% pink and moist Drainage (amount, consistency, odor) None noted Periwound:Generalized edema lower extremities.  Dressing procedure/placement/frequency:CLeanse bilateral legs with soap and water and pat dry.  Apply zinc layer, secured with Coban for compression.  Change weekly.  (Changed 09/07/14) Will not follow at this time.  Please re-consult if needed.  Domenic Moras RN BSN [place] Pager [phone]

## 2014-09-07 NOTE — Care Management Note (Signed)
Case Management Note  Patient Details  Name: Samantha Mcmillan MRN: [id number] Date of Birth: 1955/10/31  Subjective/Objective:   Readmit for CHF. Recently at [facility] 07/03-07/06 for CHF. PMH: cirrhosis of the liver, diastolic heart failure, COPD, hypertension, depression, irritable bowel syndrome hyperlipidemia, sleep apnea, diabetes mellitus type 2. Lives at home with her grandson and has a private live in care giver (ex husband). Patient active with [facility] for nursing, PT, OT. She was seen by her PCP, Wednesday this week. She has had 1 visit by the home health agency since discharge. Notified Emmit Pomfret at [facility] of admission and requested patient be made [place]. Also requested that she have a  home health nursing visit after discharge daily for a minimum of 14 days and longer if needed to prevent readmission. Agency is agreeable. Spoke with patient and discussed discharge plan and she agrees with POC.  Will follow progress.            Action/Plan:   Expected Discharge Date:                  Expected Discharge Plan:  [place]  In-House Referral:     Discharge planning Services  CM Consult  Post Acute Care Choice:    Choice offered to:     DME Arranged:    DME Agency:  [facility]  HH Arranged:    [place] Agency:  [facility]  Status of Service:  In process, will continue to follow  Medicare Important Message Given:    Date Medicare IM Given:    Medicare IM give by:    Date Additional Medicare IM Given:    Additional Medicare Important Message give by:     If discussed at [place] of Stay Meetings, dates discussed:    Additional Comments:  Jolly Mango, RN 09/07/2014, 9:54 AM

## 2014-09-07 NOTE — Progress Notes (Addendum)
Initial Nutrition Assessment  INTERVENTION:   Meals and Snacks: Cater to patient preferences on newly modified diet order; pt may benefit from carb modified diet order as well Medical Food Supplement Therapy: Will recommend decreasing Glucerna to BID with recent change to diet order including fluid restriction. RD also notes order for Pro-stat po once daily.  Education: pt reports in conversation that she is on 'no salt' diet and has been because of cirrhosis, pt also reports watching intake of concentrated sweets. RD will educate/review again on follow if appropriate as pt reports previous knowledge and/or practice PTA on visit.  NUTRITION DIAGNOSIS:   Increased nutrient needs related to chronic illness as evidenced by  (diabetic foot ulcer with h/o liver cirrhosis and DM as well).   GOAL:   Patient will meet greater than or equal to 90% of their needs  MONITOR:    (Energy Intake, Electrolyte and Renal Profile, Glucose Profile, Anthropometrics)  REASON FOR ASSESSMENT:    (RD Screen, Renal Diet Order)    ASSESSMENT:   Pt admitted with diabetic foot ulcer and hyponatremia. Pt with recent admission one week ago with CHF. Pt also with h/o liver cirrhosis and DM.  Past Medical History  Diagnosis Date  . GERD (gastroesophageal reflux disease)   . Colon polyp   . Cirrhosis of liver   . Hypertension   . Depression   . COPD (chronic obstructive pulmonary disease)   . IBS (irritable bowel syndrome)   . On home oxygen therapy     "2L; 24/7" (05/04/2014)  . Family history of adverse reaction to anesthesia     "my sister doesn't wake up good when she's put deep to sleep"  . High cholesterol   . Heart murmur   . Asthma   . Pneumonia "several times"  . Chronic bronchitis     "get it pretty much q yr"   . Sleep apnea     "can't tolerate CPAP" (05/04/2014)  . Type II diabetes mellitus   . History of blood transfusion     "related to my anemia"  . Anemia   . Iron deficiency anemia    . Headache     "more than a couple times/wk" (05/04/2014)  . Arthritis     "some in my feet" (05/04/2014)  . Chronic lower back pain   . Anxiety   . Acute on chronic diastolic CHF (congestive heart failure) 05/16/2014  . OCD (obsessive compulsive disorder)   . Panic attack   . PTSD (post-traumatic stress disorder)   . Chronic respiratory failure   . Cirrhosis of liver   . IDA (iron deficiency anemia) 08/14/2014    Diet Order:  Diet renal with fluid restriction Fluid restriction:: 1200 mL Fluid; Room service appropriate?: Yes; Fluid consistency:: Thin   Current Nutrition: Pt ate 100% of waffle this am on visit. Pt reports now on fluid restriction per nephrology so she has adjusted intake of liquids.  Food/Nutrition-Related History: Pt reports PTA eating 2 meals per day usually 2 eggs scrambled on toast and sometimes fruit, peanut butter crackers for lunch and then vegetables for dinner. Pt reports not really wanting to eat meats recently. Pt reports eating fruit throughout the day. Pt reports having had Glucerna previously at rehab facility and drinks 'sometimes.' Pt reports having been on low sodium diet order.    Medications: Novolog, Lactulose, Mag-Ox, Reglan, MVI, Senokot, thiamine, Protonix  Electrolyte/Renal Profile and Glucose Profile:   Recent Labs Lab 09/06/14 2204 09/07/14 0610 09/07/14 0933  NA 124* 129* 128*  K 3.5 3.6 3.4*  CL 87* 89* 89*  CO2 $Re'29 31 31  'ofB$ BUN $R'12 14 14  'Rt$ CREATININE 0.82 0.84 0.83  CALCIUM 7.7* 8.1* 8.2*  GLUCOSE 132* 94 153*   Protein Profile: No results for input(s): ALBUMIN in the last 168 hours.  Hepatic Function Latest Ref Rng 08/26/2014 07/06/2014 05/16/2014  Total Protein 6.5 - 8.1 g/dL 6.5 6.2(L) 5.8(L)  Albumin 3.5 - 5.0 g/dL 3.1(L) 3.1(L) 2.7(L)  AST 15 - 41 U/L 65(H) 43(H) 38(H)  ALT 14 - 54 U/L $Remo'29 17 24  'AjKfF$ Alk Phosphatase 38 - 126 U/L 289(H) 188(H) 273(H)  Total Bilirubin 0.3 - 1.2 mg/dL 0.3 0.8 0.6  Bilirubin, Direct 0.1 - 0.5 mg/dL 0.2  - -   Gastrointestinal Profile: Last BM:  09/06/2014   Nutrition-Focused Physical Exam Findings: Nutrition-Focused physical exam completed. Findings are WDL for fat depletion, muscle depletion, and moderate edema of b/l lower extremities (2+) per Nsg.     Weight Change: Pt reports at the end of June (while she was at rehab facility) her weight decreased to 167lbs from 220lbs previously; however per [facility] pt weight gain since last month. Pt reports weight fluctuates greatly with fluid retention.   Skin:   diabetic leg ulcers, wound care following  Height:   Ht Readings from Last 1 Encounters:  09/06/14 $RemoveB'5\' 4"'jyYyerUQ$  (1.626 m)    Weight:   Wt Readings from Last 1 Encounters:  09/07/14 198 lb (89.812 kg)    Ideal Body Weight:   54.5kg  Wt Readings from Last 10 Encounters:  09/07/14 198 lb (89.812 kg)  08/29/14 191 lb 3.2 oz (86.728 kg)  08/10/14 183 lb 10.3 oz (83.3 kg)  07/25/14 184 lb (83.462 kg)  07/07/14 182 lb (82.555 kg)  06/06/14 179 lb 6.4 oz (81.375 kg)  05/21/14 182 lb 1.6 oz (82.6 kg)  05/18/14 174 lb 9.7 oz (79.2 kg)  05/16/14 182 lb (82.555 kg)  05/08/14 198 lb 7.7 oz (90.03 kg)    BMI:  Body mass index is 33.97 kg/(m^2).  Estimated Nutritional Needs:   Kcal:  1452-1716kcals, BEE: 1100kcals, TEE: (IF 1.1-1.3)(AF 1.2) using IBW of 54.5kg  Protein:  55-65g protein (1.0-1.2g/kg) using IBW of 54.5kg  Fluid:  1363-1639mL of fluid (25-30mL/kg) using IBW of 54.5kg  EDUCATION NEEDS:   No education needs identified at this time   [place], RD, LDN Pager [phone]

## 2014-09-07 NOTE — Plan of Care (Signed)
Problem: Discharge Progression Outcomes Goal: Other Discharge Outcomes/Goals Outcome: Progressing - No complaints of pain this shift. - Continues on IV fluids. - Assist to BR.  - Continues Q6hr sodium. Will continue to monitor.

## 2014-09-07 NOTE — Progress Notes (Signed)
Subjective:   Patient is known to our practice from outpatient. She is followed by Dr. Juleen China. She has known history of diastolic congestive heart failure, cirrhosis of the liver, COPD with home oxygen 2 L, , type 2 diabetes, chronic lower extremity edema, chronic hyponatremia. She presents for shortness of breath. Chest x-ray shows volume overload. At the time of admission, she was found to have a sodium of 116. She was treated with 3% saline. This morning her sodium had improved to 129. 3% saline has been stopped Patient is sitting up getting ready to eat breakfast. She has almost 90 ounce of fluid sitting on her breakfast tray including free water, milk, Pepsi, etc   Objective:  Vital signs in last 24 hours:  Temp:  [97.7 F (36.5 C)-98.4 F (36.9 C)] 98 F (36.7 C) (07/15 0817) Pulse Rate:  [77-88] 77 (07/15 0817) Resp:  [8-18] 10 (07/14 1542) BP: (103-159)/(45-79) 108/49 mmHg (07/15 0817) SpO2:  [98 %-100 %] 100 % (07/15 0817) Weight:  [86.183 kg (190 lb)-89.812 kg (198 lb)] 89.812 kg (198 lb) (07/15 0510)  Weight change:  Filed Weights   09/06/14 1141 09/07/14 0510  Weight: 86.183 kg (190 lb) 89.812 kg (198 lb)    Intake/Output: I/O last 3 completed shifts: In: -  Out: 1700 [Urine:1700]     Physical Exam: General:  no acute distress, sitting up in the bed   HEENT  anicteric, moist mucous membranes   Neck  supple   Pulm/lungs  mild fine crackles at bases, otherwise clear, normal effort   CVS/Heart  regular rhythm, no rub or gallop   Abdomen:   mildly distended, non tender   Extremities:  2+ peripheral edema, Ace wrap's   Neurologic:  alert, able to answer questions   Skin:  no acute rashes   Access:        Basic Metabolic Panel:  Recent Labs Lab 09/06/14 1215 09/06/14 1712 09/06/14 2204 09/07/14 0610 09/07/14 0933  NA 116* 121* 124* 129* 128*  K 3.9 3.9 3.5 3.6 3.4*  CL 83* 82* 87* 89* 89*  CO2 28 30 29 31 31   GLUCOSE 124* 129* 132* 94 153*  BUN 11  11 12 14 14   CREATININE 0.70 0.75 0.82 0.84 0.83  CALCIUM 7.6* 8.2* 7.7* 8.1* 8.2*     CBC:  Recent Labs Lab 09/06/14 1215 09/07/14 0500  WBC 2.6* 2.4*  NEUTROABS 2.2  --   HGB 8.4* 7.8*  HCT 25.4* 23.7*  MCV 88.3 88.5  PLT 115* 124*      Microbiology: Results for orders placed or performed during the hospital encounter of 07/06/14  Culture, blood (routine x 2)     Status: None   Collection Time: 07/06/14  6:08 PM  Result Value Ref Range Status   Specimen Description BLOOD  Final   Special Requests NONE  Final   Culture NO GROWTH 5 DAYS  Final   Report Status 07/11/2014 FINAL  Final  Culture, blood (routine x 2)     Status: None   Collection Time: 07/06/14  7:00 PM  Result Value Ref Range Status   Specimen Description BLOOD  Final   Special Requests NONE  Final   Culture  Setup Time   Final    ANAEROBIC BOTTLE ONLY GRAM POSITIVE COCCI IN CHAINS CRITICAL RESULT CALLED TO, READ BACK BY AND VERIFIED WITH: BRENDA BECKER AT 59 BY JEF    Culture   Final    STREPTOCOCCUS GROUP G ANAEROBIC BOTTLE ONLY There  is no known Penicillin Resistant Beta Streptococcus in the [place] For patients that are Penicillin-allergic, Erythromycin is 85-94% susceptible, and Clindamycin is 80% susceptible.  Contact Microbiology within 7 days if sensitivity testing is  required.      Report Status 07/12/2014 FINAL  Final   Organism ID, Bacteria STREPTOCOCCUS GROUP G  Final      Susceptibility   Streptococcus group g - MIC (ETEST)*    PENICILLIN Value in next row Sensitive      SENSITIVE0.094    VANCOMYCIN Value in next row Sensitive      SENSITIVE0.50    CEFTRIAXONE Value in next row Sensitive      SENSITIVE0.064    LEVOFLOXACIN Value in next row Sensitive      SENSITIVE0.75    * STREPTOCOCCUS GROUP G  Urine culture     Status: None   Collection Time: 07/06/14  7:00 PM  Result Value Ref Range Status   Specimen Description URINE, CLEAN CATCH  Final   Special Requests NONE  Final    Culture NO GROWTH 2 DAYS  Final   Report Status 07/08/2014 FINAL  Final  Wound culture     Status: None   Collection Time: 07/09/14 11:08 AM  Result Value Ref Range Status   Specimen Description ULCER  Final   Special Requests NONE  Final   Gram Stain FEW WBC SEEN RARE GRAM NEGATIVE RODS   Final   Culture NO GROWTH 4 DAYS  Final   Report Status 07/14/2014 FINAL  Final    Coagulation Studies: No results for input(s): LABPROT, INR in the last 72 hours.  Urinalysis: No results for input(s): COLORURINE, LABSPEC, PHURINE, GLUCOSEU, HGBUR, BILIRUBINUR, KETONESUR, PROTEINUR, UROBILINOGEN, NITRITE, LEUKOCYTESUR in the last 72 hours.  Invalid input(s): APPERANCEUR    Imaging: Dg Chest Portable 1 View  09/06/2014   CLINICAL DATA:  Low sodium levels, headache, shortness of breath  EXAM: PORTABLE CHEST - 1 VIEW  COMPARISON:  08/26/2014  FINDINGS: No focal consolidation or pneumothorax. Bilateral perihilar interstitial thickening and indistinctness of the central pulmonary vasculature. No significant pleural effusion. Stable cardiomediastinal silhouette. No acute osseous abnormality. Posterior cervical fusion is noted.  IMPRESSION: 1. Findings concerning for mild pulmonary edema.   Electronically Signed   By: Kathreen Devoid   On: 09/06/2014 13:10     Medications:     . allopurinol  100 mg Oral BID  . ARIPiprazole  5 mg Oral Daily  . atorvastatin  10 mg Oral q1800  . clonazePAM  0.5 mg Oral Daily  . feeding supplement (GLUCERNA SHAKE)  237 mL Oral TID BM  . feeding supplement (PRO-STAT SUGAR FREE 64)  30 mL Oral Daily  . gabapentin  300 mg Oral QHS  . heparin  5,000 Units Subcutaneous 3 times per day  . insulin aspart  0-5 Units Subcutaneous QHS  . insulin aspart  0-9 Units Subcutaneous TID WC  . lactulose  10 g Oral BID  . lamoTRIgine  150 mg Oral QHS  . levothyroxine  75 mcg Oral QAC breakfast  . lisinopril  20 mg Oral Daily  . magnesium oxide  400 mg Oral BID  . metoCLOPramide  5 mg  Oral TID AC & HS  . metoprolol succinate  25 mg Oral BID  . montelukast  10 mg Oral QHS  . multivitamin with minerals  1 tablet Oral Daily  . nicotine  7 mg Transdermal Daily  . pantoprazole  40 mg Oral Daily  . potassium chloride  10 mEq Oral QHS  . senna-docusate  2 tablet Oral QHS  . sertraline  100 mg Oral QHS  . spironolactone  50 mg Oral Daily  . thiamine  100 mg Oral Daily   acetaminophen, albuterol, budesonide-formoterol, cyclobenzaprine, guaiFENesin, HYDROmorphone, ipratropium-albuterol, ondansetron **OR** ondansetron (ZOFRAN) IV, polyethylene glycol  Assessment/ Plan:  59 y.o. female with cirrhosis of the liver, COPD, diastolic congestive heart failure, type 2 diabetes, GERD, chronic peripheral edema, chronic anemia, hypothyroidism, presented with shortness of breath and is found to have hyponatremia  1. Hyponatremia. Admission sodium was 116. Treatment with 3% saline, it has improved to 129->128 this morning. 3% saline has been stopped. Urine osmolality is 206 Hyponatremia is likely related to excessive fluid intake in the setting of cirrhosis of liver. Counseled patient regarding strict fluid restriction to 30 ounces per day of free water and may be 1-2 cups of other fluid. We will get her restarted on home dose of  Bumex . Monitor Na closely  Check TSH, Uric acid 2. Peripheral edema - chronic - ACE- wraps   LOS: 1 Benno Brensinger 7/15/201610:03 AM

## 2014-09-07 NOTE — Progress Notes (Signed)
[facility] [place] NAME: Samantha Mcmillan    MR#:  [id number]  DATE OF BIRTH:  [birth year]  SUBJECTIVE:  Feels better today. Came in with weakness and sob and was found to have sodium of 116  REVIEW OF SYSTEMS:   Review of Systems  Constitutional: Positive for malaise/fatigue. Negative for fever, chills and weight loss.  HENT: Negative for ear discharge, ear pain and nosebleeds.   Eyes: Negative for blurred vision, pain and discharge.  Respiratory: Positive for shortness of breath. Negative for sputum production, wheezing and stridor.   Cardiovascular: Negative for chest pain, palpitations, orthopnea and PND.  Gastrointestinal: Negative for nausea, vomiting, abdominal pain and diarrhea.  Genitourinary: Negative for urgency and frequency.  Musculoskeletal: Negative for back pain and joint pain.  Neurological: Positive for weakness. Negative for sensory change, speech change and focal weakness.  Psychiatric/Behavioral: Negative for depression. The patient is not nervous/anxious.   All other systems reviewed and are negative.  Tolerating Diet:yes  DRUG ALLERGIES:   Allergies  Allergen Reactions  . Fish Allergy Anaphylaxis and Swelling  . Iodine Shortness Of Breath  . Latex Anaphylaxis  . Percocet [Oxycodone-Acetaminophen] Shortness Of Breath  . Shellfish Allergy Anaphylaxis and Swelling  . Amitriptyline Other (See Comments)    Reaction:  Mental changes   . Tape Other (See Comments)    Pt states that it pulls her skin off.   . Wellbutrin [Bupropion] Other (See Comments)    Reaction:  Mental changes   . Augmentin [Amoxicillin-Pot Clavulanate] Rash  . Codeine Rash  . Cortisone Rash  . Diphenhydramine Rash  . Other Rash and Other (See Comments)    Pt states that she is allergic to Darvocet.   . Vicodin [Hydrocodone-Acetaminophen] Rash and Other (See Comments)    Reaction:  Dizziness      VITALS:  Blood pressure 111/58,  pulse 79, temperature 98 F (36.7 C), temperature source Oral, resp. rate 19, height 5\' 4"  (1.626 m), weight 89.812 kg (198 lb), SpO2 100 %.  PHYSICAL EXAMINATION:   Physical Exam  GENERAL:  59 y.o.-year-old patient lying in the bed with no acute distress.looks chronically ill  EYES: Pupils equal, round, reactive to light and accommodation. No scleral icterus. Extraocular muscles intact.  HEENT: Head atraumatic, normocephalic. Oropharynx and nasopharynx clear.  NECK:  Supple, no jugular venous distention. No thyroid enlargement, no tenderness.  LUNGS: decreased breath sounds bilaterally, no wheezing, rales, rhonchi. No use of accessory muscles of respiration.  CARDIOVASCULAR: S1, S2 normal. No murmurs, rubs, or gallops.  ABDOMEN: Soft, nontender, nondistended. Bowel sounds present. No organomegaly or mass.  EXTREMITIES: No cyanosis, clubbing or edema b/l.    NEUROLOGIC: Cranial nerves II through XII are intact. No focal Motor or sensory deficits b/l.   PSYCHIATRIC: alert and oriented x 3.  SKIN: No obvious rash, lesion, or ulcer.    LABORATORY PANEL:   CBC  Recent Labs Lab 09/07/14 0500  WBC 2.4*  HGB 7.8*  HCT 23.7*  PLT 124*    Chemistries   Recent Labs Lab 09/07/14 0933  NA 128*  K 3.4*  CL 89*  CO2 31  GLUCOSE 153*  BUN 14  CREATININE 0.83  CALCIUM 8.2*    Cardiac Enzymes No results for input(s): TROPONINI in the last 168 hours.  RADIOLOGY:  Dg Chest Portable 1 View  09/06/2014   CLINICAL DATA:  Low sodium levels, headache, shortness of breath  EXAM: PORTABLE CHEST - 1 VIEW  COMPARISON:  08/26/2014  FINDINGS: No focal consolidation or pneumothorax. Bilateral perihilar interstitial thickening and indistinctness of the central pulmonary vasculature. No significant pleural effusion. Stable cardiomediastinal silhouette. No acute osseous abnormality. Posterior cervical fusion is noted.  IMPRESSION: 1. Findings concerning for mild pulmonary edema.   Electronically  Signed   By: Samantha Mcmillan   On: 09/06/2014 13:10     ASSESSMENT AND PLAN:  59 y.o. female with a known history of diastolic congestive heart failure, cirrhosis of liver, COPD oxygen dependent 2 L nasal cannula. The patient has had worsening shortness of breath and generalized weakness 2-3 days.   * Severe hyponatremia acute on chronic suspect de to chronic cirrhosis and CHF chronic diastolic -restrict fluids -received 3% hypertonic saline  -116-->124-->129  *Acute on chronic diastolic CHF  -doing well -cont IV lasix today and change to po home meds from tomorrow -pt has chronic home oxygen  * Pancytopenia Chronic due to liver dz  *Liver cirrhosis stable  * Chronic respiratory failure on home oxygen 2 L.    Case discussed with Care Management/Social Worker. Management plans discussed with the patient, family and they are in agreement.  CODE STATUS: full  DVT Prophylaxis: TEDS/SCD/Heparin  TOTAL TIME TAKING CARE OF THIS PATIENT: 35 minutes.  >50% time spent on counselling and coordination of care  POSSIBLE D/C IN 1-2 DAYS, DEPENDING ON CLINICAL CONDITION.   Samantha Mcmillan M.D on 09/07/2014 at 1:22 PM  Between 7am to 6pm - Pager - [phone]  After 6pm go to [email or web address] - password [email or web address] [facility]  Office  [phone]  CC: Primary care physician; [facility], MD

## 2014-09-07 NOTE — Plan of Care (Signed)
Problem: Discharge Progression Outcomes Goal: Other Discharge Outcomes/Goals Outcome: Progressing Monitoring lab work, NA- 128  O2 per [place] 100% on 2.5L Does not complain of pain or discomfort at this time. Continuing to monitor.

## 2014-09-08 LAB — BASIC METABOLIC PANEL
ANION GAP: 6 (ref 5–15)
BUN: 18 mg/dL (ref 6–20)
CHLORIDE: 92 mmol/L — AB (ref 101–111)
CO2: 32 mmol/L (ref 22–32)
Calcium: 8 mg/dL — ABNORMAL LOW (ref 8.9–10.3)
Creatinine, Ser: 0.78 mg/dL (ref 0.44–1.00)
GFR calc non Af Amer: >60 mL/min (ref >60)
Glucose, Bld: 83 mg/dL (ref 65–99)
POTASSIUM: 3.8 mmol/L (ref 3.5–5.1)
Sodium: 130 mmol/L — ABNORMAL LOW (ref 135–145)

## 2014-09-08 LAB — GLUCOSE, CAPILLARY: Glucose-Capillary: 77 mg/dL (ref 65–99)

## 2014-09-08 LAB — MAGNESIUM: Magnesium: 1.6 mg/dL — ABNORMAL LOW (ref 1.7–2.4)

## 2014-09-08 NOTE — Progress Notes (Signed)
Resume home health orders with [facility] for PT and OT. Add RN for monitoring and observation of wound. Aaron Edelman Dr Lavonda Jumbo / Braxton Feathers, RN, BSN. On 09/08/14 @ 10:30am.

## 2014-09-08 NOTE — Discharge Instructions (Signed)
Low sodium and ADA diet. Activity as tolerated. Fluid restriction. Continue home O2 [place]. Resume home health.

## 2014-09-08 NOTE — Progress Notes (Signed)
Subjective:   Sodium improved to 130. Husband at bedside.    Objective:  Vital signs in last 24 hours:  Temp:  [97.8 F (36.6 C)-98.3 F (36.8 C)] 97.8 F (36.6 C) (07/16 0501) Pulse Rate:  [72-79] 75 (07/16 0503) Resp:  [18-20] 18 (07/16 0501) BP: (106-120)/(43-58) 106/43 mmHg (07/16 0503) SpO2:  [100 %] 100 % (07/16 0501) Weight:  [87.272 kg (192 lb 6.4 oz)] 87.272 kg (192 lb 6.4 oz) (07/16 0501)  Weight change: 1.089 kg (2 lb 6.4 oz) Filed Weights   09/06/14 1141 09/07/14 0510 09/08/14 0501  Weight: 86.183 kg (190 lb) 89.812 kg (198 lb) 87.272 kg (192 lb 6.4 oz)    Intake/Output: I/O last 3 completed shifts: In: 360 [P.O.:360] Out: 2700 [Urine:2700]     Physical Exam: General:  no acute distress, sitting up in the bed   HEENT  anicteric, moist mucous membranes   Neck  supple   Pulm/lungs  mild fine crackles at bases, otherwise clear, normal effort   CVS/Heart  regular rhythm, no rub or gallop   Abdomen:   mildly distended, non tender   Extremities:  1+ peripheral edema, Ace wrap's   Neurologic:  alert, able to answer questions   Skin:  no acute rashes   Access:        Basic Metabolic Panel:  Recent Labs Lab 09/06/14 1712 09/06/14 2204 09/07/14 0610 09/07/14 0933 09/08/14 0516  NA 121* 124* 129* 128* 130*  K 3.9 3.5 3.6 3.4* 3.8  CL 82* 87* 89* 89* 92*  CO2 30 29 31 31  32  GLUCOSE 129* 132* 94 153* 83  BUN 11 12 14 14 18   CREATININE 0.75 0.82 0.84 0.83 0.78  CALCIUM 8.2* 7.7* 8.1* 8.2* 8.0*  MG  --   --   --   --  1.6*     CBC:  Recent Labs Lab 09/06/14 1215 09/07/14 0500  WBC 2.6* 2.4*  NEUTROABS 2.2  --   HGB 8.4* 7.8*  HCT 25.4* 23.7*  MCV 88.3 88.5  PLT 115* 124*      Microbiology: Results for orders placed or performed during the hospital encounter of 07/06/14  Culture, blood (routine x 2)     Status: None   Collection Time: 07/06/14  6:08 PM  Result Value Ref Range Status   Specimen Description BLOOD  Final   Special  Requests NONE  Final   Culture NO GROWTH 5 DAYS  Final   Report Status 07/11/2014 FINAL  Final  Culture, blood (routine x 2)     Status: None   Collection Time: 07/06/14  7:00 PM  Result Value Ref Range Status   Specimen Description BLOOD  Final   Special Requests NONE  Final   Culture  Setup Time   Final    ANAEROBIC BOTTLE ONLY GRAM POSITIVE COCCI IN CHAINS CRITICAL RESULT CALLED TO, READ BACK BY AND VERIFIED WITH: BRENDA BECKER AT 47 BY JEF    Culture   Final    STREPTOCOCCUS GROUP G ANAEROBIC BOTTLE ONLY There is no known Penicillin Resistant Beta Streptococcus in the [place] For patients that are Penicillin-allergic, Erythromycin is 85-94% susceptible, and Clindamycin is 80% susceptible.  Contact Microbiology within 7 days if sensitivity testing is  required.      Report Status 07/12/2014 FINAL  Final   Organism ID, Bacteria STREPTOCOCCUS GROUP G  Final      Susceptibility   Streptococcus group g - MIC (ETEST)*    PENICILLIN Value in next  row Sensitive      SENSITIVE0.094    VANCOMYCIN Value in next row Sensitive      SENSITIVE0.50    CEFTRIAXONE Value in next row Sensitive      SENSITIVE0.064    LEVOFLOXACIN Value in next row Sensitive      SENSITIVE0.75    * STREPTOCOCCUS GROUP G  Urine culture     Status: None   Collection Time: 07/06/14  7:00 PM  Result Value Ref Range Status   Specimen Description URINE, CLEAN CATCH  Final   Special Requests NONE  Final   Culture NO GROWTH 2 DAYS  Final   Report Status 07/08/2014 FINAL  Final  Wound culture     Status: None   Collection Time: 07/09/14 11:08 AM  Result Value Ref Range Status   Specimen Description ULCER  Final   Special Requests NONE  Final   Gram Stain FEW WBC SEEN RARE GRAM NEGATIVE RODS   Final   Culture NO GROWTH 4 DAYS  Final   Report Status 07/14/2014 FINAL  Final    Coagulation Studies: No results for input(s): LABPROT, INR in the last 72 hours.  Urinalysis: No results for input(s): COLORURINE,  LABSPEC, PHURINE, GLUCOSEU, HGBUR, BILIRUBINUR, KETONESUR, PROTEINUR, UROBILINOGEN, NITRITE, LEUKOCYTESUR in the last 72 hours.  Invalid input(s): APPERANCEUR    Imaging: Dg Chest Portable 1 View  09/06/2014   CLINICAL DATA:  Low sodium levels, headache, shortness of breath  EXAM: PORTABLE CHEST - 1 VIEW  COMPARISON:  08/26/2014  FINDINGS: No focal consolidation or pneumothorax. Bilateral perihilar interstitial thickening and indistinctness of the central pulmonary vasculature. No significant pleural effusion. Stable cardiomediastinal silhouette. No acute osseous abnormality. Posterior cervical fusion is noted.  IMPRESSION: 1. Findings concerning for mild pulmonary edema.   Electronically Signed   By: Kathreen Devoid   On: 09/06/2014 13:10     Medications:     . allopurinol  100 mg Oral BID  . ARIPiprazole  5 mg Oral Daily  . atorvastatin  10 mg Oral q1800  . clonazePAM  0.5 mg Oral Daily  . feeding supplement (GLUCERNA SHAKE)  237 mL Oral BID BM  . feeding supplement (PRO-STAT SUGAR FREE 64)  30 mL Oral Daily  . gabapentin  300 mg Oral QHS  . heparin  5,000 Units Subcutaneous 3 times per day  . insulin aspart  0-5 Units Subcutaneous QHS  . insulin aspart  0-9 Units Subcutaneous TID WC  . lactulose  10 g Oral BID  . lamoTRIgine  150 mg Oral QHS  . levothyroxine  75 mcg Oral QAC breakfast  . lisinopril  20 mg Oral Daily  . magnesium oxide  400 mg Oral BID  . metoCLOPramide  5 mg Oral TID AC & HS  . metoprolol succinate  25 mg Oral BID  . montelukast  10 mg Oral QHS  . multivitamin with minerals  1 tablet Oral Daily  . nicotine  7 mg Transdermal Daily  . pantoprazole  40 mg Oral Daily  . potassium chloride  10 mEq Oral QHS  . senna-docusate  2 tablet Oral QHS  . sertraline  100 mg Oral QHS  . spironolactone  50 mg Oral Daily  . thiamine  100 mg Oral Daily   acetaminophen, albuterol, budesonide-formoterol, cyclobenzaprine, guaiFENesin, HYDROmorphone, ipratropium-albuterol,  ondansetron **OR** ondansetron (ZOFRAN) IV, polyethylene glycol  Assessment/ Plan:  59 y.o. female with cirrhosis of the liver, COPD, diastolic congestive heart failure, type 2 diabetes, GERD, chronic peripheral edema, chronic  anemia, hypothyroidism, presented with shortness of breath and is found to have hyponatremia  1. Hyponatremia. Admission sodium was 116 on admission. Treatment with 3% saline,  Hyponatremia is likely related to excessive fluid intake in the setting of cirrhosis of liver. Counseled patient regarding strict fluid restriction to 30 ounces per day of free water and may be 1-2 cups of other fluid. home dose of  Bumex . Monitor Na closely   2. Peripheral edema: secondary to hepatic cirrhosis.  - chronic - ACE- wraps, bumex and spironolactone  3. Diabetes Mellitus type II Insulin dependent: with chronic kidney disease and Proteinuria: at baseline, Will continue to monitor.  - Continue glucose control  4. Hypertension: lisinopril, metoprolol, and diuretics.    LOS: [place], Jobin Montelongo 7/16/201611:12 AM

## 2014-09-08 NOTE — Plan of Care (Signed)
Problem: Discharge Progression Outcomes Goal: Other Discharge Outcomes/Goals - No complaints of pain this shift.  - Assist to BR.   - Continue to monitor sodium levels. Will continue to monitor.

## 2014-09-08 NOTE — Discharge Summary (Signed)
[facility] [place] NAME: Samantha Mcmillan    MR#:  [id number]  DATE OF BIRTH:  Jul 21, 1955  DATE OF ADMISSION:  09/06/2014 ADMITTING PHYSICIAN: Demetrios Loll, MD  DATE OF DISCHARGE: 09/08/2014 11:14 AM  PRIMARY CARE PHYSICIAN: JADALI,FAYEGH, MD    ADMISSION DIAGNOSIS:  Hyponatremia [E87.1]   DISCHARGE DIAGNOSIS:  Hyponatremia Acute on chronic diastolic CHF  Pancytopenia  SECONDARY DIAGNOSIS:   Past Medical History  Diagnosis Date  . GERD (gastroesophageal reflux disease)   . Colon polyp   . Cirrhosis of liver   . Hypertension   . Depression   . COPD (chronic obstructive pulmonary disease)   . IBS (irritable bowel syndrome)   . On home oxygen therapy     "2L; 24/7" (05/04/2014)  . Family history of adverse reaction to anesthesia     "my sister doesn't wake up good when she's put deep to sleep"  . High cholesterol   . Heart murmur   . Asthma   . Pneumonia "several times"  . Chronic bronchitis     "get it pretty much q yr"   . Sleep apnea     "can't tolerate CPAP" (05/04/2014)  . Type II diabetes mellitus   . History of blood transfusion     "related to my anemia"  . Anemia   . Iron deficiency anemia   . Headache     "more than a couple times/wk" (05/04/2014)  . Arthritis     "some in my feet" (05/04/2014)  . Chronic lower back pain   . Anxiety   . Acute on chronic diastolic CHF (congestive heart failure) 05/16/2014  . OCD (obsessive compulsive disorder)   . Panic attack   . PTSD (post-traumatic stress disorder)   . Chronic respiratory failure   . Cirrhosis of liver   . IDA (iron deficiency anemia) 08/14/2014    HOSPITAL COURSE:    * Severe hyponatremia acute on chronic suspect de to chronic cirrhosis and CHF chronic diastolic The patient is on fluid restriction and received 3% hypertonic saline. Sodium increased to 130, near her baseline.  *Acute on chronic diastolic CHF  Was on lasix and we'll change to po  home meds after discharge.  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  *Liver cirrhosis stable  * Chronic respiratory failure on home oxygen 2 L.     DISCHARGE CONDITIONS:   Stable. Discharged to home today and resume home health.  CONSULTS OBTAINED:  Treatment Team:  Murlean Iba, MD  DRUG ALLERGIES:   Allergies  Allergen Reactions  . Fish Allergy Anaphylaxis and Swelling  . Iodine Shortness Of Breath  . Latex Anaphylaxis  . Percocet [Oxycodone-Acetaminophen] Shortness Of Breath  . Shellfish Allergy Anaphylaxis and Swelling  . Amitriptyline Other (See Comments)    Reaction:  Mental changes   . Tape Other (See Comments)    Pt states that it pulls her skin off.   . Wellbutrin [Bupropion] Other (See Comments)    Reaction:  Mental changes   . Augmentin [Amoxicillin-Pot Clavulanate] Rash  . Codeine Rash  . Cortisone Rash  . Diphenhydramine Rash  . Other Rash and Other (See Comments)    Pt states that she is allergic to Darvocet.   . Vicodin [Hydrocodone-Acetaminophen] Rash and Other (See Comments)    Reaction:  Dizziness      DISCHARGE MEDICATIONS:   Discharge Medication List as of 09/08/2014 10:59 AM    CONTINUE these medications which have NOT  CHANGED   Details  acetaminophen (TYLENOL) 500 MG tablet Take 1,000 mg by mouth every 6 (six) hours as needed for mild pain or headache. , Until Discontinued, Historical Med    albuterol (PROVENTIL HFA;VENTOLIN HFA) 108 (90 BASE) MCG/ACT inhaler Inhale 2 puffs into the lungs every 4 (four) hours as needed for wheezing or shortness of breath., Until Discontinued, Historical Med    albuterol (PROVENTIL) (2.5 MG/3ML) 0.083% nebulizer solution Take 2.5 mg by nebulization every 6 (six) hours as needed for wheezing or shortness of breath., Until Discontinued, Historical Med    albuterol-ipratropium (COMBIVENT) 18-103 MCG/ACT inhaler Inhale 2 puffs into the lungs daily., Until Discontinued, Historical Med    allopurinol  (ZYLOPRIM) 100 MG tablet Take 100 mg by mouth 2 (two) times daily. , Until Discontinued, Historical Med    ARIPiprazole (ABILIFY) 5 MG tablet Take 5 mg by mouth daily. , Until Discontinued, Historical Med    atorvastatin (LIPITOR) 10 MG tablet Take 10 mg by mouth daily. , Until Discontinued, Historical Med    budesonide-formoterol (SYMBICORT) 80-4.5 MCG/ACT inhaler Inhale 2 puffs into the lungs 2 (two) times daily. , Until Discontinued, Historical Med    bumetanide (BUMEX) 1 MG tablet Take 1 tablet (1 mg total) by mouth daily., Starting 08/29/2014, Until Discontinued, Normal    buprenorphine (BUTRANS - DOSED MCG/HR) 10 MCG/HR PTWK patch Place 1 patch (10 mcg total) onto the skin once a week., Starting 05/18/2014, Until Discontinued, Print    clonazePAM (KLONOPIN) 0.5 MG tablet Take 1 tablet (0.5 mg total) by mouth daily., Starting 05/22/2014, Until Discontinued, Print    cyclobenzaprine (FLEXERIL) 10 MG tablet Take 10 mg by mouth 3 (three) times daily as needed for muscle spasms., Until Discontinued, Historical Med    feeding supplement, GLUCERNA SHAKE, (GLUCERNA SHAKE) LIQD Take 237 mLs by mouth 3 (three) times daily between meals., Starting 05/18/2014, Until Discontinued, No Print    gabapentin (NEURONTIN) 300 MG capsule Take 300 mg by mouth daily. , Until Discontinued, Historical Med    guaiFENesin (MUCINEX) 600 MG 12 hr tablet Take 600 mg by mouth daily. , Until Discontinued, Historical Med    HYDROmorphone (DILAUDID) 2 MG tablet Take 2 mg by mouth every 6 (six) hours as needed for severe pain. , Until Discontinued, Historical Med    lactulose (CHRONULAC) 10 GM/15ML solution Take 20 g by mouth 2 (two) times daily as needed for mild constipation., Until Discontinued, Historical Med    lamoTRIgine (LAMICTAL) 150 MG tablet Take 150 mg by mouth at bedtime. , Until Discontinued, Historical Med    levothyroxine (SYNTHROID, LEVOTHROID) 88 MCG tablet Take 88 mcg by mouth daily., Until  Discontinued, Historical Med    lisinopril (PRINIVIL,ZESTRIL) 20 MG tablet Take 20 mg by mouth daily., Until Discontinued, Historical Med    magnesium oxide (MAG-OX) 400 MG tablet Take 400 mg by mouth 2 (two) times daily., Until Discontinued, Historical Med    metoprolol succinate (TOPROL-XL) 25 MG 24 hr tablet Take 25 mg by mouth 2 (two) times daily., Until Discontinued, Historical Med    montelukast (SINGULAIR) 10 MG tablet Take 10 mg by mouth daily. , Until Discontinued, Historical Med    Multiple Vitamin (MULTIVITAMIN WITH MINERALS) TABS tablet Take 1 tablet by mouth daily., Starting 05/18/2014, Until Discontinued, No Print    ondansetron (ZOFRAN) 4 MG tablet Take 4 mg by mouth every 8 (eight) hours as needed for nausea or vomiting. , Until Discontinued, Historical Med    pantoprazole (PROTONIX) 40 MG tablet Take  40 mg by mouth daily., Until Discontinued, Historical Med    psyllium (HYDROCIL/METAMUCIL) 95 % PACK Take 1 packet by mouth daily as needed for mild constipation., Until Discontinued, Historical Med    sennosides-docusate sodium (SENOKOT-S) 8.6-50 MG tablet Take 2 tablets by mouth at bedtime as needed for constipation. , Until Discontinued, Historical Med    sertraline (ZOLOFT) 100 MG tablet Take 100 mg by mouth daily. , Until Discontinued, Historical Med    spironolactone (ALDACTONE) 25 MG tablet Take 2 tablets (50 mg total) by mouth daily., Starting 07/25/2014, Until Thu 07/25/15, Normal    thiamine 100 MG tablet Take 1 tablet (100 mg total) by mouth daily., Starting 05/18/2014, Until Discontinued, No Print    Vitamin D, Ergocalciferol, (DRISDOL) 50000 UNITS CAPS capsule Take 50,000 Units by mouth every 7 (seven) days. Pt takes on Saturday., Until Discontinued, Historical Med      STOP taking these medications     Amino Acids-Protein Hydrolys (FEEDING SUPPLEMENT, PRO-STAT SUGAR FREE 64,) LIQD      ipratropium-albuterol (DUONEB) 0.5-2.5 (3) MG/3ML SOLN      metoCLOPramide  (REGLAN) 5 MG tablet      polyethylene glycol (MIRALAX / GLYCOLAX) packet      potassium chloride (K-DUR) 10 MEQ tablet          DISCHARGE INSTRUCTIONS:     If you experience worsening of your admission symptoms, develop shortness of breath, life threatening emergency, suicidal or homicidal thoughts you must seek medical attention immediately by calling 911 or calling your MD immediately  if symptoms less severe.  You Must read complete instructions/literature along with all the possible adverse reactions/side effects for all the Medicines you take and that have been prescribed to you. Take any new Medicines after you have completely understood and accept all the possible adverse reactions/side effects.   Please note  You were cared for by a hospitalist during your hospital stay. If you have any questions about your discharge medications or the care you received while you were in the hospital after you are discharged, you can call the unit and asked to speak with the hospitalist on call if the hospitalist that took care of you is not available. Once you are discharged, your primary care physician will handle any further medical issues. Please note that NO REFILLS for any discharge medications will be authorized once you are discharged, as it is imperative that you return to your primary care physician (or establish a relationship with a primary care physician if you do not have one) for your aftercare needs so that they can reassess your need for medications and monitor your lab values.    Today   SUBJECTIVE  No complaint and on oxygen by nasal cannular 2 L    VITAL SIGNS:  Blood pressure 106/43, pulse 75, temperature 97.8 F (36.6 C), temperature source Oral, resp. rate 18, height 5\' 4"  (1.626 m), weight 87.272 kg (192 lb 6.4 oz), SpO2 100 %.  I/O:   Intake/Output Summary (Last 24 hours) at 09/08/14 1452 Last data filed at 09/08/14 0800  Gross per 24 hour  Intake    360 ml   Output    700 ml  Net   -340 ml    PHYSICAL EXAMINATION:  GENERAL:  59 y.o.-year-old patient lying in the bed with no acute distress.  EYES: Pupils equal, round, reactive to light and accommodation. No scleral icterus. Extraocular muscles intact.  HEENT: Head atraumatic, normocephalic. Oropharynx and nasopharynx clear.  NECK:  Supple,  no jugular venous distention. No thyroid enlargement, no tenderness.  LUNGS: Normal breath sounds bilaterally, no wheezing, rales,rhonchi or crepitation. No use of accessory muscles of respiration.  CARDIOVASCULAR: S1, S2 normal. No murmurs, rubs, or gallops.  ABDOMEN: Soft, non-tender, non-distended. Bowel sounds present. No organomegaly or mass.  EXTREMITIES: bilateral lower extremity in dressing, no cyanosis, or clubbing.  NEUROLOGIC: Cranial nerves II through XII are intact. Muscle strength 5/5 in all extremities. Sensation intact. Gait not checked.  PSYCHIATRIC: The patient is alert and oriented x 3.  SKIN: No obvious rash, lesion, or ulcer.   DATA REVIEW:   CBC  Recent Labs Lab 09/07/14 0500  WBC 2.4*  HGB 7.8*  HCT 23.7*  PLT 124*    Chemistries   Recent Labs Lab 09/08/14 0516  NA 130*  K 3.8  CL 92*  CO2 32  GLUCOSE 83  BUN 18  CREATININE 0.78  CALCIUM 8.0*  MG 1.6*    Cardiac Enzymes No results for input(s): TROPONINI in the last 168 hours.  Microbiology Results  Results for orders placed or performed during the hospital encounter of 07/06/14  Culture, blood (routine x 2)     Status: None   Collection Time: 07/06/14  6:08 PM  Result Value Ref Range Status   Specimen Description BLOOD  Final   Special Requests NONE  Final   Culture NO GROWTH 5 DAYS  Final   Report Status 07/11/2014 FINAL  Final  Culture, blood (routine x 2)     Status: None   Collection Time: 07/06/14  7:00 PM  Result Value Ref Range Status   Specimen Description BLOOD  Final   Special Requests NONE  Final   Culture  Setup Time   Final     ANAEROBIC BOTTLE ONLY GRAM POSITIVE COCCI IN CHAINS CRITICAL RESULT CALLED TO, READ BACK BY AND VERIFIED WITH: BRENDA BECKER AT 84 BY JEF    Culture   Final    STREPTOCOCCUS GROUP G ANAEROBIC BOTTLE ONLY There is no known Penicillin Resistant Beta Streptococcus in the [place] For patients that are Penicillin-allergic, Erythromycin is 85-94% susceptible, and Clindamycin is 80% susceptible.  Contact Microbiology within 7 days if sensitivity testing is  required.      Report Status 07/12/2014 FINAL  Final   Organism ID, Bacteria STREPTOCOCCUS GROUP G  Final      Susceptibility   Streptococcus group g - MIC (ETEST)*    PENICILLIN Value in next row Sensitive      SENSITIVE0.094    VANCOMYCIN Value in next row Sensitive      SENSITIVE0.50    CEFTRIAXONE Value in next row Sensitive      SENSITIVE0.064    LEVOFLOXACIN Value in next row Sensitive      SENSITIVE0.75    * STREPTOCOCCUS GROUP G  Urine culture     Status: None   Collection Time: 07/06/14  7:00 PM  Result Value Ref Range Status   Specimen Description URINE, CLEAN CATCH  Final   Special Requests NONE  Final   Culture NO GROWTH 2 DAYS  Final   Report Status 07/08/2014 FINAL  Final  Wound culture     Status: None   Collection Time: 07/09/14 11:08 AM  Result Value Ref Range Status   Specimen Description ULCER  Final   Special Requests NONE  Final   Gram Stain FEW WBC SEEN RARE GRAM NEGATIVE RODS   Final   Culture NO GROWTH 4 DAYS  Final   Report Status  07/14/2014 FINAL  Final    RADIOLOGY:  No results found.      Management plans discussed with the patient, family and they are in agreement.  CODE STATUS:   TOTAL TIME TAKING CARE OF THIS PATIENT: 35 minutes.    Demetrios Loll M.D on 09/08/2014 at 2:52 PM  Between 7am to 6pm - Pager - [phone]  After 6pm go to [email or web address] - password [email or web address] [facility]  Office  [phone]  CC: Primary care physician; [facility], MD

## 2014-09-08 NOTE — Progress Notes (Signed)
Dr. Marcille Blanco notified of pt output, In and Out cath once per MD order.

## 2014-09-08 NOTE — Progress Notes (Signed)
Discharge note:  Patient discharged home. PICC line in place in right upper arm, dressing intact. Instructions given to patient, verbalized understanding. Husband providing transportation to home.

## 2014-09-08 NOTE — Care Management Note (Signed)
Case Management Note  Patient Details  Name: Samantha Mcmillan MRN: [id number] Date of Birth: [birth year]  Subjective/Objective:      Referral to resume home health OT and PT and to add RN faxed and called to Emmit Pomfret at [place] .              Action/Plan:   Expected Discharge Date:                  Expected Discharge Plan:  [place]  In-House Referral:     Discharge planning Services  CM Consult  Post Acute Care Choice:    Choice offered to:     DME Arranged:    DME Agency:  [facility]  HH Arranged:    [place] Agency:  [facility]  Status of Service:  In process, will continue to follow  Medicare Important Message Given:    Date Medicare IM Given:    Medicare IM give by:    Date Additional Medicare IM Given:    Additional Medicare Important Message give by:     If discussed at [place] of Stay Meetings, dates discussed:    Additional Comments:  [name] A, RN 09/08/2014, 10:39 AM

## 2014-09-19 ENCOUNTER — Inpatient Hospital Stay: Payer: Medicare Other | Attending: Hematology and Oncology | Admitting: Hematology and Oncology

## 2014-09-19 ENCOUNTER — Telehealth: Payer: Self-pay

## 2014-09-19 ENCOUNTER — Ambulatory Visit: Payer: Medicare Other

## 2014-09-19 ENCOUNTER — Encounter: Payer: Self-pay | Admitting: Hematology and Oncology

## 2014-09-19 ENCOUNTER — Other Ambulatory Visit: Payer: Medicare Other

## 2014-09-19 VITALS — BP 160/65 | HR 83 | Temp 96.5°F | Resp 18 | Ht 64.0 in | Wt 192.6 lb

## 2014-09-19 DIAGNOSIS — K766 Portal hypertension: Secondary | ICD-10-CM | POA: Diagnosis not present

## 2014-09-19 DIAGNOSIS — Z809 Family history of malignant neoplasm, unspecified: Secondary | ICD-10-CM | POA: Diagnosis not present

## 2014-09-19 DIAGNOSIS — F329 Major depressive disorder, single episode, unspecified: Secondary | ICD-10-CM | POA: Diagnosis not present

## 2014-09-19 DIAGNOSIS — D72819 Decreased white blood cell count, unspecified: Secondary | ICD-10-CM

## 2014-09-19 DIAGNOSIS — Z79899 Other long term (current) drug therapy: Secondary | ICD-10-CM | POA: Insufficient documentation

## 2014-09-19 DIAGNOSIS — M129 Arthropathy, unspecified: Secondary | ICD-10-CM | POA: Insufficient documentation

## 2014-09-19 DIAGNOSIS — D6959 Other secondary thrombocytopenia: Secondary | ICD-10-CM | POA: Diagnosis not present

## 2014-09-19 DIAGNOSIS — Z800 Family history of malignant neoplasm of digestive organs: Secondary | ICD-10-CM | POA: Insufficient documentation

## 2014-09-19 DIAGNOSIS — D696 Thrombocytopenia, unspecified: Secondary | ICD-10-CM | POA: Insufficient documentation

## 2014-09-19 DIAGNOSIS — Z8719 Personal history of other diseases of the digestive system: Secondary | ICD-10-CM | POA: Insufficient documentation

## 2014-09-19 DIAGNOSIS — E119 Type 2 diabetes mellitus without complications: Secondary | ICD-10-CM | POA: Diagnosis not present

## 2014-09-19 DIAGNOSIS — I5033 Acute on chronic diastolic (congestive) heart failure: Secondary | ICD-10-CM | POA: Insufficient documentation

## 2014-09-19 DIAGNOSIS — J449 Chronic obstructive pulmonary disease, unspecified: Secondary | ICD-10-CM | POA: Insufficient documentation

## 2014-09-19 DIAGNOSIS — Z803 Family history of malignant neoplasm of breast: Secondary | ICD-10-CM | POA: Diagnosis not present

## 2014-09-19 DIAGNOSIS — F4310 Post-traumatic stress disorder, unspecified: Secondary | ICD-10-CM | POA: Insufficient documentation

## 2014-09-19 DIAGNOSIS — K589 Irritable bowel syndrome without diarrhea: Secondary | ICD-10-CM | POA: Insufficient documentation

## 2014-09-19 DIAGNOSIS — R531 Weakness: Secondary | ICD-10-CM | POA: Diagnosis not present

## 2014-09-19 DIAGNOSIS — J45909 Unspecified asthma, uncomplicated: Secondary | ICD-10-CM

## 2014-09-19 DIAGNOSIS — D509 Iron deficiency anemia, unspecified: Secondary | ICD-10-CM | POA: Insufficient documentation

## 2014-09-19 DIAGNOSIS — G4730 Sleep apnea, unspecified: Secondary | ICD-10-CM | POA: Insufficient documentation

## 2014-09-19 DIAGNOSIS — I5032 Chronic diastolic (congestive) heart failure: Secondary | ICD-10-CM | POA: Insufficient documentation

## 2014-09-19 DIAGNOSIS — F419 Anxiety disorder, unspecified: Secondary | ICD-10-CM | POA: Insufficient documentation

## 2014-09-19 DIAGNOSIS — Z8701 Personal history of pneumonia (recurrent): Secondary | ICD-10-CM | POA: Insufficient documentation

## 2014-09-19 DIAGNOSIS — K7460 Unspecified cirrhosis of liver: Secondary | ICD-10-CM | POA: Insufficient documentation

## 2014-09-19 DIAGNOSIS — Z86010 Personal history of colonic polyps: Secondary | ICD-10-CM | POA: Diagnosis not present

## 2014-09-19 DIAGNOSIS — Z9981 Dependence on supplemental oxygen: Secondary | ICD-10-CM | POA: Diagnosis not present

## 2014-09-19 DIAGNOSIS — I10 Essential (primary) hypertension: Secondary | ICD-10-CM

## 2014-09-19 DIAGNOSIS — F17210 Nicotine dependence, cigarettes, uncomplicated: Secondary | ICD-10-CM | POA: Diagnosis not present

## 2014-09-19 DIAGNOSIS — F42 Obsessive-compulsive disorder: Secondary | ICD-10-CM | POA: Diagnosis not present

## 2014-09-19 DIAGNOSIS — R5383 Other fatigue: Secondary | ICD-10-CM | POA: Diagnosis not present

## 2014-09-19 DIAGNOSIS — E871 Hypo-osmolality and hyponatremia: Secondary | ICD-10-CM | POA: Diagnosis not present

## 2014-09-19 DIAGNOSIS — R161 Splenomegaly, not elsewhere classified: Secondary | ICD-10-CM | POA: Diagnosis not present

## 2014-09-19 DIAGNOSIS — K649 Unspecified hemorrhoids: Secondary | ICD-10-CM | POA: Insufficient documentation

## 2014-09-19 DIAGNOSIS — K219 Gastro-esophageal reflux disease without esophagitis: Secondary | ICD-10-CM | POA: Insufficient documentation

## 2014-09-19 DIAGNOSIS — D649 Anemia, unspecified: Secondary | ICD-10-CM | POA: Insufficient documentation

## 2014-09-19 LAB — IRON AND TIBC
Iron: 19 ug/dL — ABNORMAL LOW (ref 28–170)
Saturation Ratios: 6 % — ABNORMAL LOW (ref 10.4–31.8)
TIBC: 345 ug/dL (ref 250–450)
UIBC: 326 ug/dL

## 2014-09-19 LAB — CBC WITH DIFFERENTIAL/PLATELET
Basophils Absolute: 0 10*3/uL (ref 0–0.1)
Basophils Relative: 1 %
Eosinophils Absolute: 0 10*3/uL (ref 0–0.7)
Eosinophils Relative: 1 %
HCT: 24.9 % — ABNORMAL LOW (ref 35.0–47.0)
Hemoglobin: 8.2 g/dL — ABNORMAL LOW (ref 12.0–16.0)
Lymphocytes Relative: 21 %
Lymphs Abs: 0.6 10*3/uL — ABNORMAL LOW (ref 1.0–3.6)
MCH: 28.9 pg (ref 26.0–34.0)
MCHC: 32.8 g/dL (ref 32.0–36.0)
MCV: 88 fL (ref 80.0–100.0)
Monocytes Absolute: 0.2 10*3/uL (ref 0.2–0.9)
Monocytes Relative: 7 %
Neutro Abs: 2.1 10*3/uL (ref 1.4–6.5)
Neutrophils Relative %: 70 %
Platelets: 114 10*3/uL — ABNORMAL LOW (ref 150–440)
RBC: 2.83 MIL/uL — ABNORMAL LOW (ref 3.80–5.20)
RDW: 15.1 % — ABNORMAL HIGH (ref 11.5–14.5)
WBC: 3 10*3/uL — ABNORMAL LOW (ref 3.6–11.0)

## 2014-09-19 LAB — VITAMIN B12: Vitamin B-12: 505 pg/mL (ref 180–914)

## 2014-09-19 LAB — FOLATE: Folate: 19.8 ng/mL (ref >5.9)

## 2014-09-19 LAB — PREPARE RBC (CROSSMATCH)

## 2014-09-19 LAB — RETICULOCYTES
RBC.: 2.83 MIL/uL — ABNORMAL LOW (ref 3.80–5.20)
Retic Count, Absolute: 36.8 10*3/uL (ref 19.0–183.0)
Retic Ct Pct: 1.3 % (ref 0.4–3.1)

## 2014-09-19 LAB — SEDIMENTATION RATE: Sed Rate: 44 mm/hr — ABNORMAL HIGH (ref 0–30)

## 2014-09-19 LAB — FERRITIN: Ferritin: 14 ng/mL (ref 11–307)

## 2014-09-19 MED ORDER — SODIUM CHLORIDE 0.9 % IJ SOLN
10.0000 mL | INTRAMUSCULAR | Status: AC | PRN
  Administered 2014-09-19: 10 mL
  Filled 2014-09-19: qty 10

## 2014-09-19 MED ORDER — HEPARIN SOD (PORK) LOCK FLUSH 100 UNIT/ML IV SOLN
250.0000 [IU] | INTRAVENOUS | Status: AC | PRN
  Administered 2014-09-19: 250 [IU]

## 2014-09-19 NOTE — Telephone Encounter (Signed)
Pt verbalized understanding of having an appointment in [place] [facility] for blood transfusion at 0900; instructed pt to arrive about 30 minutes prior to appointment

## 2014-09-20 ENCOUNTER — Inpatient Hospital Stay: Payer: Medicare Other

## 2014-09-20 VITALS — BP 123/73 | HR 69 | Temp 96.0°F | Resp 18

## 2014-09-20 DIAGNOSIS — D649 Anemia, unspecified: Secondary | ICD-10-CM

## 2014-09-20 DIAGNOSIS — D509 Iron deficiency anemia, unspecified: Secondary | ICD-10-CM | POA: Diagnosis not present

## 2014-09-20 LAB — ANA W/REFLEX: Anti Nuclear Antibody(ANA): NEGATIVE

## 2014-09-20 MED ORDER — SODIUM CHLORIDE 0.9 % IJ SOLN
10.0000 mL | INTRAMUSCULAR | Status: AC | PRN
  Administered 2014-09-20: 10 mL
  Filled 2014-09-20: qty 10

## 2014-09-20 MED ORDER — ACETAMINOPHEN 325 MG PO TABS
650.0000 mg | ORAL_TABLET | Freq: Once | ORAL | Status: AC
  Administered 2014-09-20: 650 mg via ORAL
  Filled 2014-09-20: qty 2

## 2014-09-20 MED ORDER — HEPARIN SOD (PORK) LOCK FLUSH 100 UNIT/ML IV SOLN
250.0000 [IU] | INTRAVENOUS | Status: AC | PRN
  Administered 2014-09-20: 250 [IU]
  Filled 2014-09-20: qty 5

## 2014-09-20 MED ORDER — SODIUM CHLORIDE 0.9 % IV SOLN
250.0000 mL | Freq: Once | INTRAVENOUS | Status: AC
  Administered 2014-09-20: 250 mL via INTRAVENOUS
  Filled 2014-09-20: qty 250

## 2014-09-21 ENCOUNTER — Ambulatory Visit: Payer: Medicare Other | Attending: Family | Admitting: Family

## 2014-09-21 ENCOUNTER — Encounter: Payer: Self-pay | Admitting: Family

## 2014-09-21 VITALS — BP 128/53 | HR 65 | Resp 20 | Ht 64.0 in | Wt 196.0 lb

## 2014-09-21 DIAGNOSIS — F410 Panic disorder [episodic paroxysmal anxiety] without agoraphobia: Secondary | ICD-10-CM | POA: Diagnosis not present

## 2014-09-21 DIAGNOSIS — F42 Obsessive-compulsive disorder: Secondary | ICD-10-CM | POA: Insufficient documentation

## 2014-09-21 DIAGNOSIS — R0602 Shortness of breath: Secondary | ICD-10-CM | POA: Diagnosis present

## 2014-09-21 DIAGNOSIS — K219 Gastro-esophageal reflux disease without esophagitis: Secondary | ICD-10-CM | POA: Diagnosis not present

## 2014-09-21 DIAGNOSIS — E780 Pure hypercholesterolemia: Secondary | ICD-10-CM | POA: Diagnosis not present

## 2014-09-21 DIAGNOSIS — D509 Iron deficiency anemia, unspecified: Secondary | ICD-10-CM | POA: Insufficient documentation

## 2014-09-21 DIAGNOSIS — I5032 Chronic diastolic (congestive) heart failure: Secondary | ICD-10-CM | POA: Insufficient documentation

## 2014-09-21 DIAGNOSIS — F4310 Post-traumatic stress disorder, unspecified: Secondary | ICD-10-CM | POA: Insufficient documentation

## 2014-09-21 DIAGNOSIS — J449 Chronic obstructive pulmonary disease, unspecified: Secondary | ICD-10-CM | POA: Insufficient documentation

## 2014-09-21 DIAGNOSIS — K7460 Unspecified cirrhosis of liver: Secondary | ICD-10-CM | POA: Insufficient documentation

## 2014-09-21 DIAGNOSIS — G8929 Other chronic pain: Secondary | ICD-10-CM | POA: Insufficient documentation

## 2014-09-21 DIAGNOSIS — K589 Irritable bowel syndrome without diarrhea: Secondary | ICD-10-CM | POA: Insufficient documentation

## 2014-09-21 DIAGNOSIS — E119 Type 2 diabetes mellitus without complications: Secondary | ICD-10-CM

## 2014-09-21 DIAGNOSIS — Z9981 Dependence on supplemental oxygen: Secondary | ICD-10-CM | POA: Diagnosis not present

## 2014-09-21 DIAGNOSIS — F329 Major depressive disorder, single episode, unspecified: Secondary | ICD-10-CM | POA: Insufficient documentation

## 2014-09-21 DIAGNOSIS — F17210 Nicotine dependence, cigarettes, uncomplicated: Secondary | ICD-10-CM | POA: Insufficient documentation

## 2014-09-21 DIAGNOSIS — G4730 Sleep apnea, unspecified: Secondary | ICD-10-CM | POA: Diagnosis not present

## 2014-09-21 DIAGNOSIS — I10 Essential (primary) hypertension: Secondary | ICD-10-CM | POA: Diagnosis not present

## 2014-09-21 DIAGNOSIS — R011 Cardiac murmur, unspecified: Secondary | ICD-10-CM | POA: Diagnosis not present

## 2014-09-21 DIAGNOSIS — Z720 Tobacco use: Secondary | ICD-10-CM

## 2014-09-21 LAB — TYPE AND SCREEN
ABO/RH(D): A NEG
Antibody Screen: NEGATIVE
Unit division: 0

## 2014-09-21 NOTE — Patient Instructions (Addendum)
Continue to follow a 2000mg  sodium diet.   1.800.QUIT.NOW  Smoking Cessation Quitting smoking is important to your health and has many advantages. However, it is not always easy to quit since nicotine is a very addictive drug. Oftentimes, people try 3 times or more before being able to quit. This document explains the best ways for you to prepare to quit smoking. Quitting takes hard work and a lot of effort, but you can do it. ADVANTAGES OF QUITTING SMOKING  You will live longer, feel better, and live better.  Your body will feel the impact of quitting smoking almost immediately.  Within 20 minutes, blood pressure decreases. Your pulse returns to its normal level.  After 8 hours, carbon monoxide levels in the blood return to normal. Your oxygen level increases.  After 24 hours, the chance of having a heart attack starts to decrease. Your breath, hair, and body stop smelling like smoke.  After 48 hours, damaged nerve endings begin to recover. Your sense of taste and smell improve.  After 72 hours, the body is virtually free of nicotine. Your bronchial tubes relax and breathing becomes easier.  After 2 to 12 weeks, lungs can hold more air. Exercise becomes easier and circulation improves.  The risk of having a heart attack, stroke, cancer, or lung disease is greatly reduced.  After 1 year, the risk of coronary heart disease is cut in half.  After 5 years, the risk of stroke falls to the same as a nonsmoker.  After 10 years, the risk of lung cancer is cut in half and the risk of other cancers decreases significantly.  After 15 years, the risk of coronary heart disease drops, usually to the level of a nonsmoker.  If you are pregnant, quitting smoking will improve your chances of having a healthy baby.  The people you live with, especially any children, will be healthier.  You will have extra money to spend on things other than cigarettes. QUESTIONS TO THINK ABOUT BEFORE ATTEMPTING  TO QUIT You may want to talk about your answers with your health care provider.  Why do you want to quit?  If you tried to quit in the past, what helped and what did not?  What will be the most difficult situations for you after you quit? How will you plan to handle them?  Who can help you through the tough times? Your family? Friends? A health care provider?  What pleasures do you get from smoking? What ways can you still get pleasure if you quit? Here are some questions to ask your health care provider:  How can you help me to be successful at quitting?  What medicine do you think would be best for me and how should I take it?  What should I do if I need more help?  What is smoking withdrawal like? How can I get information on withdrawal? GET READY  Set a quit date.  Change your environment by getting rid of all cigarettes, ashtrays, matches, and lighters in your home, car, or work. Do not let people smoke in your home.  Review your past attempts to quit. Think about what worked and what did not. GET SUPPORT AND ENCOURAGEMENT You have a better chance of being successful if you have help. You can get support in many ways.  Tell your family, friends, and coworkers that you are going to quit and need their support. Ask them not to smoke around you.  Get individual, group, or telephone counseling and  support. Programs are available at [employer] and health centers. Call your local health department for information about programs in your area.  Spiritual beliefs and practices may help some smokers quit.  Download a "quit meter" on your computer to keep track of quit statistics, such as how long you have gone without smoking, cigarettes not smoked, and money saved.  Get a self-help book about quitting smoking and staying off tobacco. [place] yourself from urges to smoke. Talk to someone, go for a walk, or occupy your time with a  task.  Change your normal routine. Take a different route to work. Drink tea instead of coffee. Eat breakfast in a different place.  Reduce your stress. Take a hot bath, exercise, or read a book.  Plan something enjoyable to do every day. Reward yourself for not smoking.  Explore interactive web-based programs that specialize in helping you quit. GET MEDICINE AND USE IT CORRECTLY Medicines can help you stop smoking and decrease the urge to smoke. Combining medicine with the above behavioral methods and support can greatly increase your chances of successfully quitting smoking.  Nicotine replacement therapy helps deliver nicotine to your body without the negative effects and risks of smoking. Nicotine replacement therapy includes nicotine gum, lozenges, inhalers, nasal sprays, and skin patches. Some may be available over-the-counter and others require a prescription.  Antidepressant medicine helps people abstain from smoking, but how this works is unknown. This medicine is available by prescription.  Nicotinic receptor partial agonist medicine simulates the effect of nicotine in your brain. This medicine is available by prescription. Ask your health care provider for advice about which medicines to use and how to use them based on your health history. Your health care provider will tell you what side effects to look out for if you choose to be on a medicine or therapy. Carefully read the information on the package. Do not use any other product containing nicotine while using a nicotine replacement product.  RELAPSE OR DIFFICULT SITUATIONS Most relapses occur within the first 3 months after quitting. Do not be discouraged if you start smoking again. Remember, most people try several times before finally quitting. You may have symptoms of withdrawal because your body is used to nicotine. You may crave cigarettes, be irritable, feel very hungry, cough often, get headaches, or have difficulty  concentrating. The withdrawal symptoms are only temporary. They are strongest when you first quit, but they will go away within 10-14 days. To reduce the chances of relapse, try to:  Avoid drinking alcohol. Drinking lowers your chances of successfully quitting.  Reduce the amount of caffeine you consume. Once you quit smoking, the amount of caffeine in your body increases and can give you symptoms, such as a rapid heartbeat, sweating, and anxiety.  Avoid smokers because they can make you want to smoke.  Do not let weight gain distract you. Many smokers will gain weight when they quit, usually less than 10 pounds. Eat a healthy diet and stay active. You can always lose the weight gained after you quit.  Find ways to improve your mood other than smoking. FOR MORE INFORMATION  www.smokefree.gov  Document Released: 02/03/2001 Document Revised: 06/26/2013 Document Reviewed: 05/21/2011 [facility] Patient Information 2015 [place], [place]. This information is not intended to replace advice given to you by your health care provider. Make sure you discuss any questions you have with your health care provider.

## 2014-09-21 NOTE — Progress Notes (Signed)
Subjective:    Patient ID: Samantha Mcmillan, female    DOB: 04-Aug-1955, 59 y.o.   MRN: [id number]  Shortness of Breath This is a chronic problem. The current episode started more than 1 year ago. The problem occurs daily. The problem has been gradually worsening. Associated symptoms include abdominal pain (left lateral side), chest pain (nagging "all my life"), headaches (at times), leg swelling and neck pain. Pertinent negatives include no PND, sore throat or wheezing. The symptoms are aggravated by any activity. Risk factors include smoking. She has tried steroid inhalers and beta agonist inhalers for the symptoms. The treatment provided mild relief. Her past medical history is significant for chronic lung disease and a heart failure.  Congestive Heart Failure Presents for initial visit. The disease course has been stable. Associated symptoms include abdominal pain (left lateral side), chest pain (nagging "all my life"), edema (swelling is worsening), fatigue, palpitations (sometimes) and shortness of breath (wears oxygen at 2L around the clock). Pertinent negatives include no orthopnea. The pain is at a severity of 4/10. The pain is mild. The pain is present in the epigastric region. The quality of the pain is described as pressure. The pain does not radiate. Past treatments include salt and fluid restriction, beta blockers and oxygen. The treatment provided moderate relief. Compliance with prior treatments has been good. Her past medical history is significant for chronic lung disease and DM. Compliance with diet is 51-75%.  Other This is a chronic (edema) problem. The current episode started more than 1 year ago. The problem occurs constantly. The problem has been gradually worsening. Associated symptoms include abdominal pain (left lateral side), chest pain (nagging "all my life"), coughing, fatigue, headaches (at times) and neck pain. Pertinent negatives include no congestion, nausea or sore  throat. Nothing aggravates the symptoms. She has tried position changes for the symptoms. The treatment provided mild relief.   Past Medical History  Diagnosis Date  . GERD (gastroesophageal reflux disease)   . Colon polyp   . Cirrhosis of liver   . Hypertension   . Depression   . COPD (chronic obstructive pulmonary disease)   . IBS (irritable bowel syndrome)   . On home oxygen therapy     "2L; 24/7" (05/04/2014)  . Family history of adverse reaction to anesthesia     "my sister doesn't wake up good when she's put deep to sleep"  . High cholesterol   . Heart murmur   . Asthma   . Pneumonia "several times"  . Chronic bronchitis     "get it pretty much q yr"   . Sleep apnea     "can't tolerate CPAP" (05/04/2014)  . Type II diabetes mellitus   . History of blood transfusion     "related to my anemia"  . Anemia   . Iron deficiency anemia   . Headache     "more than a couple times/wk" (05/04/2014)  . Arthritis     "some in my feet" (05/04/2014)  . Chronic lower back pain   . Anxiety   . Acute on chronic diastolic CHF (congestive heart failure) 05/16/2014  . OCD (obsessive compulsive disorder)   . Panic attack   . PTSD (post-traumatic stress disorder)   . Chronic respiratory failure   . Cirrhosis of liver   . IDA (iron deficiency anemia) 08/14/2014    Past Surgical History  Procedure Laterality Date  . Cesarean section  1979  . Cataract extraction w/ intraocular lens  implant, bilateral Bilateral  2005  . Back surgery    . Colonoscopy  2014  . Upper gastrointestinal endoscopy    . Posterior fusion cervical spine  2015    "rebuilt 3 of my neck vertebrae"  . Incision and drainage of wound Right 2009    "leg mauled  by dog"  . Tubal ligation  1979  . Dilation and curettage of uterus    . Peripheral vascular catheterization N/A 08/16/2014    Procedure: PICC Line Insertion;  Surgeon: Algernon Huxley, MD;  Location: [facility];  Service: Cardiovascular;  Laterality: N/A;     Family History  Problem Relation Age of Onset  . Cancer Father     pancreatic  . Cancer Brother   . Cancer Maternal Aunt     History  Substance Use Topics  . Smoking status: Current Every Day Smoker -- 0.50 packs/day for 48 years    Types: Cigarettes  . Smokeless tobacco: Never Used  . Alcohol Use: No    Allergies  Allergen Reactions  . Fish Allergy Anaphylaxis and Swelling  . Iodine Shortness Of Breath  . Latex Anaphylaxis  . Percocet [Oxycodone-Acetaminophen] Shortness Of Breath  . Shellfish Allergy Anaphylaxis and Swelling  . Amitriptyline Other (See Comments)    Reaction:  Mental changes   . Tape Other (See Comments)    Pt states that it pulls her skin off.   . Wellbutrin [Bupropion] Other (See Comments)    Reaction:  Mental changes   . Augmentin [Amoxicillin-Pot Clavulanate] Rash  . Codeine Rash  . Cortisone Rash  . Diphenhydramine Rash  . Other Rash and Other (See Comments)    Pt states that she is allergic to Darvocet.   . Vicodin [Hydrocodone-Acetaminophen] Rash and Other (See Comments)    Reaction:  Dizziness      Prior to Admission medications   Medication Sig Start Date End Date Taking? Authorizing Provider  acetaminophen (TYLENOL) 500 MG tablet Take 1,000 mg by mouth every 6 (six) hours as needed for mild pain or headache.    Yes Historical Provider, MD  albuterol (PROVENTIL HFA;VENTOLIN HFA) 108 (90 BASE) MCG/ACT inhaler Inhale 2 puffs into the lungs every 4 (four) hours as needed for wheezing or shortness of breath.   Yes Historical Provider, MD  albuterol (PROVENTIL) (2.5 MG/3ML) 0.083% nebulizer solution Take 2.5 mg by nebulization every 6 (six) hours as needed for wheezing or shortness of breath.   Yes Historical Provider, MD  albuterol-ipratropium (COMBIVENT) 18-103 MCG/ACT inhaler Inhale 2 puffs into the lungs daily.   Yes Historical Provider, MD  allopurinol (ZYLOPRIM) 100 MG tablet Take 100 mg by mouth 2 (two) times daily.    Yes Historical  Provider, MD  ARIPiprazole (ABILIFY) 5 MG tablet Take 5 mg by mouth daily.    Yes Historical Provider, MD  atorvastatin (LIPITOR) 10 MG tablet Take 10 mg by mouth daily.    Yes Historical Provider, MD  budesonide-formoterol (SYMBICORT) 80-4.5 MCG/ACT inhaler Inhale 2 puffs into the lungs 2 (two) times daily.    Yes Historical Provider, MD  bumetanide (BUMEX) 1 MG tablet Take 1 tablet (1 mg total) by mouth daily. 08/29/14  Yes Fritzi Mandes, MD  buprenorphine (BUTRANS - DOSED MCG/HR) 10 MCG/HR PTWK patch Place 1 patch (10 mcg total) onto the skin once a week. 05/18/14  Yes Christina P Rama, MD  clonazePAM (KLONOPIN) 0.5 MG tablet Take 1 tablet (0.5 mg total) by mouth daily. 05/22/14  Yes Estill Dooms, MD  cyclobenzaprine [facility])  10 MG tablet Take 10 mg by mouth 3 (three) times daily as needed for muscle spasms.   Yes Historical Provider, MD  feeding supplement, GLUCERNA SHAKE, (GLUCERNA SHAKE) LIQD Take 237 mLs by mouth 3 (three) times daily between meals. 05/18/14  Yes Christina P Rama, MD  gabapentin (NEURONTIN) 300 MG capsule Take 300 mg by mouth daily.    Yes Historical Provider, MD  guaiFENesin (MUCINEX) 600 MG 12 hr tablet Take 600 mg by mouth daily.    Yes Historical Provider, MD  HYDROmorphone (DILAUDID) 2 MG tablet Take 2 mg by mouth every 6 (six) hours as needed for severe pain.    Yes Historical Provider, MD  lactulose (CHRONULAC) 10 GM/15ML solution Take 20 g by mouth 2 (two) times daily as needed for mild constipation.   Yes Historical Provider, MD  lamoTRIgine (LAMICTAL) 150 MG tablet Take 150 mg by mouth at bedtime.    Yes Historical Provider, MD  levothyroxine (SYNTHROID, LEVOTHROID) 88 MCG tablet Take 88 mcg by mouth daily.   Yes Historical Provider, MD  lisinopril (PRINIVIL,ZESTRIL) 20 MG tablet Take 20 mg by mouth daily.   Yes Historical Provider, MD  magnesium oxide (MAG-OX) 400 MG tablet Take 400 mg by mouth 2 (two) times daily.   Yes Historical Provider, MD  metoprolol succinate  (TOPROL-XL) 25 MG 24 hr tablet Take 25 mg by mouth 2 (two) times daily.   Yes Historical Provider, MD  montelukast (SINGULAIR) 10 MG tablet Take 10 mg by mouth daily.    Yes Historical Provider, MD  Multiple Vitamin (MULTIVITAMIN WITH MINERALS) TABS tablet Take 1 tablet by mouth daily. 05/18/14  Yes Christina P Rama, MD  ondansetron (ZOFRAN) 4 MG tablet Take 4 mg by mouth every 8 (eight) hours as needed for nausea or vomiting.    Yes Historical Provider, MD  pantoprazole (PROTONIX) 40 MG tablet Take 40 mg by mouth daily.   Yes Historical Provider, MD  psyllium (HYDROCIL/METAMUCIL) 95 % PACK Take 1 packet by mouth daily as needed for mild constipation.   Yes Historical Provider, MD  sennosides-docusate sodium (SENOKOT-S) 8.6-50 MG tablet Take 2 tablets by mouth at bedtime as needed for constipation.    Yes Historical Provider, MD  sertraline (ZOLOFT) 100 MG tablet Take 100 mg by mouth daily.    Yes Historical Provider, MD  spironolactone (ALDACTONE) 25 MG tablet Take 2 tablets (50 mg total) by mouth daily. 07/25/14 07/25/15 Yes Lucilla Lame, MD  thiamine 100 MG tablet Take 1 tablet (100 mg total) by mouth daily. 05/18/14  Yes Venetia Maxon Rama, MD  Vitamin D, Ergocalciferol, (DRISDOL) 50000 UNITS CAPS capsule Take 50,000 Units by mouth every 7 (seven) days. Pt takes on Saturday.   Yes Historical Provider, MD      Review of Systems  Constitutional: Positive for fatigue. Negative for appetite change.  HENT: Negative for congestion, postnasal drip, sore throat and trouble swallowing.   Eyes: Negative.   Respiratory: Positive for cough and shortness of breath (wears oxygen at 2L around the clock). Negative for chest tightness and wheezing.   Cardiovascular: Positive for chest pain (nagging "all my life"), palpitations (sometimes) and leg swelling. Negative for PND.  Gastrointestinal: Positive for abdominal pain (left lateral side). Negative for nausea and abdominal distention.  Endocrine: Negative.    Genitourinary: Positive for difficulty urinating (with fluid restriction). Negative for hematuria.  Musculoskeletal: Positive for back pain (chronic back pain) and neck pain.  Skin: Negative.   Allergic/Immunologic: Negative.   Neurological: Positive for  dizziness (when bending over too far or too long) and headaches (at times). Negative for light-headedness.  Hematological: Negative for adenopathy. Bruises/bleeds easily.  Psychiatric/Behavioral: Positive for sleep disturbance (sleeping on 3 pillows under the head and 2 pillows under the feet). Negative for dysphoric mood. The patient is nervous/anxious (mini panic attacks daily).        Objective:   Physical Exam  Constitutional: She is oriented to person, place, and time. She appears well-developed and well-nourished.  HENT:  Head: Normocephalic and atraumatic.  Eyes: Conjunctivae are normal.  Neck: Normal range of motion. Neck supple.  Cardiovascular: Normal rate and regular rhythm.   Pulmonary/Chest: Effort normal. She has rhonchi in the right upper field and the left lower field.  Abdominal: Soft. She exhibits distension. There is no tenderness.  Musculoskeletal: She exhibits edema (3+ bilateral lower legs). She exhibits no tenderness.  Neurological: She is alert and oriented to person, place, and time.  Skin: Skin is warm and dry. Bruising (both arms) noted.  Psychiatric: She has a normal mood and affect. Her behavior is normal. Thought content normal.  Nursing note and vitals reviewed.   BP 128/53 mmHg  Pulse 65  Resp 20  Ht 5\' 4"  (1.626 m)  Wt 196 lb (88.905 kg)  BMI 33.63 kg/m2  SpO2 100%  LMP  (LMP Unknown)       Assessment & Plan:  1: Chronic heart failure with preserved ejection fraction- Patient presents with shortness of breath and fatigue upon exertion. She says that when she does experience symptoms, she will try to stop and rest until she feels better. She is already weighing herself daily and says that her  weight has been fluctuating from 189-195. She already takes Bumex 1mg  daily along with spironolactone 50mg  daily with taking an additional 1mg  bumex if her weight goes up. She has chronic edema in her lower legs but does feel like the swelling is worse in her legs over the last few days. She does elevate her legs when she's at home. She normally wears UNA boots on her legs but they are currently off as she sees the infectious disease provider later today. She and her son are already reading food labels so as to follow a low sodium diet. She does say that she uses salt on tomato and cucumber sandwiches. She is trying to reduce her salt usage and she was encouraged to not add any additional salt to her food. She is trying to follow her fluid restriction but admits that it's difficult as she likes to keep ice in her mouth. She does not have a cardiologist so will make that referral per her preference.  2: COPD- Currently wears her oxygen at 1.5-2L around the clock. She does use her inhalers as prescribed.  3: Diabetes- She says that her glucose levels have been between120-125. She is being followed closely by her PCP. Currently being diet controlled. 4: Tobacco use- She continues to smoke about 5 cigarettes daily and is trying to quit. She does remove her oxygen as well as goes to a different part of the house to smoke. Cessation discussed for 3 minutes. 5: Panic attacks- She says that she experiences these on a daily basis but they are usually mild in nature. Seeing a psychiatrist for these, her PTSD and her depression.   Return in 1 month or sooner for any questions/problems before then.

## 2014-09-25 ENCOUNTER — Ambulatory Visit: Payer: Medicare Other

## 2014-09-26 ENCOUNTER — Inpatient Hospital Stay: Payer: Medicare Other | Attending: Hematology and Oncology

## 2014-09-26 ENCOUNTER — Other Ambulatory Visit: Payer: Self-pay | Admitting: Family Medicine

## 2014-09-26 DIAGNOSIS — M129 Arthropathy, unspecified: Secondary | ICD-10-CM | POA: Insufficient documentation

## 2014-09-26 DIAGNOSIS — Z86010 Personal history of colonic polyps: Secondary | ICD-10-CM | POA: Insufficient documentation

## 2014-09-26 DIAGNOSIS — M545 Low back pain: Secondary | ICD-10-CM | POA: Insufficient documentation

## 2014-09-26 DIAGNOSIS — Z9181 History of falling: Secondary | ICD-10-CM | POA: Insufficient documentation

## 2014-09-26 DIAGNOSIS — K7460 Unspecified cirrhosis of liver: Secondary | ICD-10-CM | POA: Insufficient documentation

## 2014-09-26 DIAGNOSIS — R609 Edema, unspecified: Secondary | ICD-10-CM | POA: Insufficient documentation

## 2014-09-26 DIAGNOSIS — D509 Iron deficiency anemia, unspecified: Secondary | ICD-10-CM | POA: Insufficient documentation

## 2014-09-26 DIAGNOSIS — Q2733 Arteriovenous malformation of digestive system vessel: Secondary | ICD-10-CM | POA: Insufficient documentation

## 2014-09-26 DIAGNOSIS — F410 Panic disorder [episodic paroxysmal anxiety] without agoraphobia: Secondary | ICD-10-CM | POA: Insufficient documentation

## 2014-09-26 DIAGNOSIS — F419 Anxiety disorder, unspecified: Secondary | ICD-10-CM | POA: Insufficient documentation

## 2014-09-26 DIAGNOSIS — K5790 Diverticulosis of intestine, part unspecified, without perforation or abscess without bleeding: Secondary | ICD-10-CM | POA: Insufficient documentation

## 2014-09-26 DIAGNOSIS — F4310 Post-traumatic stress disorder, unspecified: Secondary | ICD-10-CM | POA: Insufficient documentation

## 2014-09-26 DIAGNOSIS — R161 Splenomegaly, not elsewhere classified: Secondary | ICD-10-CM | POA: Insufficient documentation

## 2014-09-26 DIAGNOSIS — K589 Irritable bowel syndrome without diarrhea: Secondary | ICD-10-CM | POA: Insufficient documentation

## 2014-09-26 DIAGNOSIS — G8929 Other chronic pain: Secondary | ICD-10-CM | POA: Insufficient documentation

## 2014-09-26 DIAGNOSIS — Z79899 Other long term (current) drug therapy: Secondary | ICD-10-CM | POA: Insufficient documentation

## 2014-09-26 DIAGNOSIS — F329 Major depressive disorder, single episode, unspecified: Secondary | ICD-10-CM | POA: Insufficient documentation

## 2014-09-26 DIAGNOSIS — R011 Cardiac murmur, unspecified: Secondary | ICD-10-CM | POA: Insufficient documentation

## 2014-09-26 DIAGNOSIS — E785 Hyperlipidemia, unspecified: Secondary | ICD-10-CM | POA: Insufficient documentation

## 2014-09-26 DIAGNOSIS — I10 Essential (primary) hypertension: Secondary | ICD-10-CM | POA: Insufficient documentation

## 2014-09-26 DIAGNOSIS — Z8669 Personal history of other diseases of the nervous system and sense organs: Secondary | ICD-10-CM | POA: Insufficient documentation

## 2014-09-26 DIAGNOSIS — E119 Type 2 diabetes mellitus without complications: Secondary | ICD-10-CM | POA: Insufficient documentation

## 2014-09-26 DIAGNOSIS — K219 Gastro-esophageal reflux disease without esophagitis: Secondary | ICD-10-CM | POA: Insufficient documentation

## 2014-09-26 DIAGNOSIS — J9610 Chronic respiratory failure, unspecified whether with hypoxia or hypercapnia: Secondary | ICD-10-CM | POA: Insufficient documentation

## 2014-09-26 DIAGNOSIS — D696 Thrombocytopenia, unspecified: Secondary | ICD-10-CM | POA: Insufficient documentation

## 2014-09-26 DIAGNOSIS — F42 Obsessive-compulsive disorder: Secondary | ICD-10-CM | POA: Insufficient documentation

## 2014-09-26 DIAGNOSIS — G4730 Sleep apnea, unspecified: Secondary | ICD-10-CM | POA: Insufficient documentation

## 2014-09-26 DIAGNOSIS — I509 Heart failure, unspecified: Secondary | ICD-10-CM | POA: Insufficient documentation

## 2014-09-26 DIAGNOSIS — F17210 Nicotine dependence, cigarettes, uncomplicated: Secondary | ICD-10-CM | POA: Insufficient documentation

## 2014-09-27 DIAGNOSIS — F422 Mixed obsessional thoughts and acts: Secondary | ICD-10-CM | POA: Insufficient documentation

## 2014-09-28 ENCOUNTER — Telehealth: Payer: Self-pay | Admitting: *Deleted

## 2014-09-28 NOTE — Telephone Encounter (Signed)
Patient has Picc line.  Unable to afford Heparin and NA to flush.  Medicare costs- $50 per month. Needs to know where they can get supplies for patient at a lesser cost.

## 2014-10-02 ENCOUNTER — Emergency Department
Admission: EM | Admit: 2014-10-02 | Discharge: 2014-10-02 | Disposition: A | Discharge status: Home / Self Care | Payer: Medicare Other | Attending: Emergency Medicine | Admitting: Emergency Medicine

## 2014-10-02 ENCOUNTER — Emergency Department: Payer: Medicare Other

## 2014-10-02 ENCOUNTER — Telehealth: Payer: Self-pay | Admitting: *Deleted

## 2014-10-02 DIAGNOSIS — W06XXXA Fall from bed, initial encounter: Secondary | ICD-10-CM | POA: Diagnosis not present

## 2014-10-02 DIAGNOSIS — Z7951 Long term (current) use of inhaled steroids: Secondary | ICD-10-CM | POA: Insufficient documentation

## 2014-10-02 DIAGNOSIS — S0990XA Unspecified injury of head, initial encounter: Secondary | ICD-10-CM

## 2014-10-02 DIAGNOSIS — E119 Type 2 diabetes mellitus without complications: Secondary | ICD-10-CM | POA: Insufficient documentation

## 2014-10-02 DIAGNOSIS — Y9289 Other specified places as the place of occurrence of the external cause: Secondary | ICD-10-CM | POA: Diagnosis not present

## 2014-10-02 DIAGNOSIS — Z91040 Latex allergy status: Secondary | ICD-10-CM | POA: Diagnosis not present

## 2014-10-02 DIAGNOSIS — Y9389 Activity, other specified: Secondary | ICD-10-CM | POA: Insufficient documentation

## 2014-10-02 DIAGNOSIS — I10 Essential (primary) hypertension: Secondary | ICD-10-CM | POA: Insufficient documentation

## 2014-10-02 DIAGNOSIS — Y998 Other external cause status: Secondary | ICD-10-CM | POA: Diagnosis not present

## 2014-10-02 DIAGNOSIS — S0083XA Contusion of other part of head, initial encounter: Secondary | ICD-10-CM | POA: Insufficient documentation

## 2014-10-02 DIAGNOSIS — R600 Localized edema: Secondary | ICD-10-CM

## 2014-10-02 DIAGNOSIS — Z79899 Other long term (current) drug therapy: Secondary | ICD-10-CM | POA: Insufficient documentation

## 2014-10-02 DIAGNOSIS — R609 Edema, unspecified: Secondary | ICD-10-CM

## 2014-10-02 LAB — BASIC METABOLIC PANEL
Anion gap: 8 (ref 5–15)
BUN: 17 mg/dL (ref 6–20)
CO2: 30 mmol/L (ref 22–32)
Calcium: 8.2 mg/dL — ABNORMAL LOW (ref 8.9–10.3)
Chloride: 89 mmol/L — ABNORMAL LOW (ref 101–111)
Creatinine, Ser: 0.8 mg/dL (ref 0.44–1.00)
GFR calc Af Amer: >60 mL/min (ref >60)
GLUCOSE: 135 mg/dL — AB (ref 65–99)
Potassium: 3.9 mmol/L (ref 3.5–5.1)
SODIUM: 127 mmol/L — AB (ref 135–145)

## 2014-10-02 LAB — PROTIME-INR
INR: 1.01
Prothrombin Time: 13.5 seconds (ref 11.4–15.0)

## 2014-10-02 LAB — CBC
HEMATOCRIT: 28.1 % — AB (ref 35.0–47.0)
HEMOGLOBIN: 9.2 g/dL — AB (ref 12.0–16.0)
MCH: 29.4 pg (ref 26.0–34.0)
MCHC: 32.7 g/dL (ref 32.0–36.0)
MCV: 89.7 fL (ref 80.0–100.0)
PLATELETS: 106 10*3/uL — AB (ref 150–440)
RBC: 3.13 MIL/uL — ABNORMAL LOW (ref 3.80–5.20)
RDW: 17.7 % — AB (ref 11.5–14.5)
WBC: 2.4 10*3/uL — ABNORMAL LOW (ref 3.6–11.0)

## 2014-10-02 MED ORDER — BUMETANIDE 1 MG PO TABS
1.0000 mg | ORAL_TABLET | ORAL | Status: AC
  Administered 2014-10-02: 1 mg via ORAL
  Filled 2014-10-02 (×2): qty 1

## 2014-10-02 NOTE — Telephone Encounter (Signed)
Hi Rodena Piety The Medicare cost would be the same no matter where she gets the supplies.  I'll call her to see if we can help another way. Naylah Cork

## 2014-10-02 NOTE — Discharge Instructions (Signed)
Please contact your primary care doctor tomorrow for follow-up. Please increase your Bumex dose to 1 mg twice daily for the next 3 days to help with your lower leg swelling/edema.  Return to the emergency room right away if you develop difficulty breathing, have a severe headache, develop any numbness or tingling or weakness in an arm or leg, have trouble speaking, or other new concerns arise.  Your PICC line likely needs to be removed, please discuss this with your doctor. There are notes in our computer system that indicate that your nursing and physician team are working to get in touch with you regarding management of this. Please recheck with them tomorrow.  Head Injury You have received a head injury. It does not appear serious at this time. Headaches and vomiting are common following head injury. It should be easy to awaken from sleeping. Sometimes it is necessary for you to stay in the emergency department for a while for observation. Sometimes admission to the hospital may be needed. After injuries such as yours, most problems occur within the first 24 hours, but side effects may occur up to 7-10 days after the injury. It is important for you to carefully monitor your condition and contact your health care provider or seek immediate medical care if there is a change in your condition. WHAT ARE THE TYPES OF HEAD INJURIES? Head injuries can be as minor as a bump. Some head injuries can be more severe. More severe head injuries include:  A jarring injury to the brain (concussion).  A bruise of the brain (contusion). This mean there is bleeding in the brain that can cause swelling.  A cracked skull (skull fracture).  Bleeding in the brain that collects, clots, and forms a bump (hematoma). WHAT CAUSES A HEAD INJURY? A serious head injury is most likely to happen to someone who is in a car wreck and is not wearing a seat belt. Other causes of major head injuries include bicycle or motorcycle  accidents, sports injuries, and falls. HOW ARE HEAD INJURIES DIAGNOSED? A complete history of the event leading to the injury and your current symptoms will be helpful in diagnosing head injuries. Many times, pictures of the brain, such as CT or MRI are needed to see the extent of the injury. Often, an overnight hospital stay is necessary for observation.  WHEN SHOULD I SEEK IMMEDIATE MEDICAL CARE?  You should get help right away if:  You have confusion or drowsiness.  You feel sick to your stomach (nauseous) or have continued, forceful vomiting.  You have dizziness or unsteadiness that is getting worse.  You have severe, continued headaches not relieved by medicine. Only take over-the-counter or prescription medicines for pain, fever, or discomfort as directed by your health care provider.  You do not have normal function of the arms or legs or are unable to walk.  You notice changes in the black spots in the center of the colored part of your eye (pupil).  You have a clear or bloody fluid coming from your nose or ears.  You have a loss of vision. During the next 24 hours after the injury, you must stay with someone who can watch you for the warning signs. This person should contact local emergency services (911 in the [place] if you have seizures, you become unconscious, or you are unable to wake up. HOW CAN I PREVENT A HEAD INJURY IN THE FUTURE? The most important factor for preventing major head injuries is avoiding motor vehicle  accidents. To minimize the potential for damage to your head, it is crucial to wear seat belts while riding in motor vehicles. Wearing helmets while bike riding and playing collision sports (like football) is also helpful. Also, avoiding dangerous activities around the house will further help reduce your risk of head injury.  WHEN CAN I RETURN TO NORMAL ACTIVITIES AND ATHLETICS? You should be reevaluated by your health care provider before returning to these  activities. If you have any of the following symptoms, you should not return to activities or contact sports until 1 week after the symptoms have stopped:  Persistent headache.  Dizziness or vertigo.  Poor attention and concentration.  Confusion.  Memory problems.  Nausea or vomiting.  Fatigue or tire easily.  Irritability.  Intolerant of bright lights or loud noises.  Anxiety or depression.  Disturbed sleep. MAKE SURE YOU:   Understand these instructions.  Will watch your condition.  Will get help right away if you are not doing well or get wors Edema Edema is an abnormal buildup of fluids in your bodytissues. Edema is somewhatdependent on gravity to pull the fluid to the lowest place in your body. That makes the condition more common in the legs and thighs (lower extremities). Painless swelling of the feet and ankles is common and becomes more likely as you get older. It is also common in looser tissues, like around your eyes.  When the affected area is squeezed, the fluid may move out of that spot and leave a dent for a few moments. This dent is called pitting.  CAUSES  There are many possible causes of edema. Eating too much salt and being on your feet or sitting for a long time can cause edema in your legs and ankles. Hot weather may make edema worse. Common medical causes of edema include: Heart failure. Liver disease. Kidney disease. Weak blood vessels in your legs. Cancer. An injury. Pregnancy. Some medications. Obesity. SYMPTOMS  Edema is usually painless.Your skin may look swollen or shiny.  DIAGNOSIS  Your health care provider may be able to diagnose edema by asking about your medical history and doing a physical exam. You may need to have tests such as X-rays, an electrocardiogram, or blood tests to check for medical conditions that may cause edema.  TREATMENT  Edema treatment depends on the cause. If you have heart, liver, or kidney disease, you need  the treatment appropriate for these conditions. General treatment may include: Elevation of the affected body part above the level of your heart. Compression of the affected body part. Pressure from elastic bandages or support stockings squeezes the tissues and forces fluid back into the blood vessels. This keeps fluid from entering the tissues. Restriction of fluid and salt intake. Use of a water pill (diuretic). These medications are appropriate only for some types of edema. They pull fluid out of your body and make you urinate more often. This gets rid of fluid and reduces swelling, but diuretics can have side effects. Only use diuretics as directed by your health care provider. HOME CARE INSTRUCTIONS  Keep the affected body part above the level of your heart when you are lying down.  Do not sit still or stand for prolonged periods.  Do not put anything directly under your knees when lying down. Do not wear constricting clothing or garters on your upper legs.  Exercise your legs to work the fluid back into your blood vessels. This may help the swelling go down.  Wear elastic  bandages or support stockings to reduce ankle swelling as directed by your health care provider.  Eat a low-salt diet to reduce fluid if your health care provider recommends it.  Only take medicines as directed by your health care provider. SEEK MEDICAL CARE IF:  Your edema is not responding to treatment. You have heart, liver, or kidney disease and notice symptoms of edema. You have edema in your legs that does not improve after elevating them.  You have sudden and unexplained weight gain. SEEK IMMEDIATE MEDICAL CARE IF:  You develop shortness of breath or chest pain.  You cannot breathe when you lie down. You develop pain, redness, or warmth in the swollen areas.  You have heart, liver, or kidney disease and suddenly get edema. You have a fever and your symptoms suddenly get worse. MAKE SURE YOU:    Understand these instructions. Will watch your condition. Will get help right away if you are not doing well or get worse. Document Released: 02/09/2005 Document Revised: 06/26/2013 Document Reviewed: 12/02/2012 [employer] Patient Information 2015 [place], [place]. This information is not intended to replace advice given to you by your health care provider. Make sure you discuss any questions you have with your health care provider.  e. Document Released: 02/09/2005 Document Revised: 02/14/2013 Document Reviewed: 10/17/2012 [facility] Patient Information 2015 [place], [place]. This information is not intended to replace advice given to you by your health care provider. Make sure you discuss any questions you have with your health care provider.

## 2014-10-02 NOTE — Telephone Encounter (Signed)
Called to report that they are having difficulty flushing her port andf that blood is always backed up in the line. Suggests it would be best to have line removed

## 2014-10-02 NOTE — ED Provider Notes (Signed)
[facility] Emergency Department Provider Note  ____________________________________________  Time seen: Approximately 6:21 PM  I have reviewed the triage vital signs and the nursing notes.   HISTORY  Chief Complaint Fall and Headache    HPI Samantha Mcmillan is a 59 y.o. female with a very extensive medical history including cirrhosis, COPD, anemia, coagulopathy.  Samantha Mcmillan comes today for evaluation of the headache for which she hit her head on Tuesday a week ago, and is been having some tenderness over the forehead and a little bruising around her right eye since that time. She talked to her doctor, and because she has a history of low platelets they recommended she come get a CAT scan.  She describes a mild and throbbing headache across her forehead where she fell while getting out of bed striking her head on the nightstand without loss of consciousness. She did not have any neck pain or other concerns except for a small skin tear on the left forearm which she has been bandaging. This is not been red or draining. She reports being up-to-date on her tetanus.  She also notes that she's been having swelling in her legs for the last month, she is to have a report with cardiology but did not make it to her appointment today. She is been working closely with her primary on this and is been taking Bumex. She denies any shortness of breath. No wheezing. No trouble breathing. No chest pain or abdominal pain.     Past Medical History  Diagnosis Date  . GERD (gastroesophageal reflux disease)   . Colon polyp   . Cirrhosis of liver   . Hypertension   . Depression   . COPD (chronic obstructive pulmonary disease)   . IBS (irritable bowel syndrome)   . On home oxygen therapy     "2L; 24/7" (05/04/2014)  . Family history of adverse reaction to anesthesia     "my sister doesn't wake up good when she's put deep to sleep"  . High cholesterol   . Heart murmur   .  Asthma   . Pneumonia "several times"  . Chronic bronchitis     "get it pretty much q yr"   . Sleep apnea     "can't tolerate CPAP" (05/04/2014)  . Type II diabetes mellitus   . History of blood transfusion     "related to my anemia"  . Anemia   . Iron deficiency anemia   . Headache     "more than a couple times/wk" (05/04/2014)  . Arthritis     "some in my feet" (05/04/2014)  . Chronic lower back pain   . Anxiety   . Acute on chronic diastolic CHF (congestive heart failure) 05/16/2014  . OCD (obsessive compulsive disorder)   . Panic attack   . PTSD (post-traumatic stress disorder)   . Chronic respiratory failure   . Cirrhosis of liver   . IDA (iron deficiency anemia) 08/14/2014    Patient Active Problem List   Diagnosis Date Noted  . Chronic diastolic heart failure 56/43/3295  . Tobacco use 09/21/2014  . Thrombocytopenia 09/19/2014  . Leukopenia 09/19/2014  . Pancytopenia 09/06/2014  . IDA (iron deficiency anemia) 08/14/2014  . Ascites 07/25/2014  . Cellulitis and abscess of lower extremity 07/07/2014  . Encephalopathy, hepatic 05/18/2014  . Polypharmacy 05/18/2014  . Failure to thrive in adult 05/16/2014  . Hepatic cirrhosis 05/16/2014  . Protein-calorie malnutrition, severe 05/16/2014  . Advanced COPD 05/16/2014  . Chronic  back pain 05/16/2014  . Weakness 05/15/2014  . Hyponatremia 05/15/2014  . HCAP (healthcare-associated pneumonia) 05/09/2014  . Urinary retention 05/09/2014  . DM2 (diabetes mellitus, type 2) 05/09/2014  . Hypothyroidism 05/05/2014    Past Surgical History  Procedure Laterality Date  . Cesarean section  1979  . Cataract extraction w/ intraocular lens  implant, bilateral Bilateral 2005  . Back surgery    . Colonoscopy  2014  . Upper gastrointestinal endoscopy    . Posterior fusion cervical spine  2015    "rebuilt 3 of my neck vertebrae"  . Incision and drainage of wound Right 2009    "leg mauled  by dog"  . Tubal ligation  1979  . Dilation  and curettage of uterus    . Peripheral vascular catheterization N/A 08/16/2014    Procedure: PICC Line Insertion;  Surgeon: Algernon Huxley, MD;  Location: [facility];  Service: Cardiovascular;  Laterality: N/A;    Current Outpatient Rx  Name  Route  Sig  Dispense  Refill  . acetaminophen (TYLENOL) 500 MG tablet   Oral   Take 1,000 mg by mouth every 6 (six) hours as needed for mild pain or headache.          . albuterol (PROVENTIL HFA;VENTOLIN HFA) 108 (90 BASE) MCG/ACT inhaler   Inhalation   Inhale 2 puffs into the lungs every 4 (four) hours as needed for wheezing or shortness of breath.         Marland Kitchen albuterol (PROVENTIL) (2.5 MG/3ML) 0.083% nebulizer solution   Nebulization   Take 2.5 mg by nebulization every 6 (six) hours as needed for wheezing or shortness of breath.         Marland Kitchen albuterol-ipratropium (COMBIVENT) 18-103 MCG/ACT inhaler   Inhalation   Inhale 2 puffs into the lungs daily.         Marland Kitchen allopurinol (ZYLOPRIM) 100 MG tablet   Oral   Take 100 mg by mouth 2 (two) times daily.          . ARIPiprazole (ABILIFY) 5 MG tablet   Oral   Take 5 mg by mouth daily.          Marland Kitchen atorvastatin (LIPITOR) 10 MG tablet   Oral   Take 10 mg by mouth daily.          . budesonide-formoterol (SYMBICORT) 80-4.5 MCG/ACT inhaler   Inhalation   Inhale 2 puffs into the lungs 2 (two) times daily.          . bumetanide (BUMEX) 1 MG tablet   Oral   Take 1 tablet (1 mg total) by mouth daily.   30 tablet   0   . buprenorphine (BUTRANS - DOSED MCG/HR) 10 MCG/HR PTWK patch   Transdermal   Place 1 patch (10 mcg total) onto the skin once a week.   4 patch   0   . clonazePAM (KLONOPIN) 0.5 MG tablet   Oral   Take 1 tablet (0.5 mg total) by mouth daily.   60 tablet   0   . cyclobenzaprine (FLEXERIL) 10 MG tablet   Oral   Take 10 mg by mouth 3 (three) times daily as needed for muscle spasms.         . feeding supplement, GLUCERNA SHAKE, (GLUCERNA SHAKE) LIQD    Oral   Take 237 mLs by mouth 3 (three) times daily between meals.      0   . gabapentin (NEURONTIN) 300 MG capsule   Oral  Take 300 mg by mouth daily.          Marland Kitchen guaiFENesin (MUCINEX) 600 MG 12 hr tablet   Oral   Take 600 mg by mouth daily.          Marland Kitchen HYDROmorphone (DILAUDID) 2 MG tablet   Oral   Take 2 mg by mouth every 6 (six) hours as needed for severe pain.          Marland Kitchen lactulose (CHRONULAC) 10 GM/15ML solution   Oral   Take 20 g by mouth 2 (two) times daily as needed for mild constipation.         Marland Kitchen lamoTRIgine (LAMICTAL) 150 MG tablet   Oral   Take 150 mg by mouth at bedtime.          Marland Kitchen levothyroxine (SYNTHROID, LEVOTHROID) 88 MCG tablet   Oral   Take 88 mcg by mouth daily.         Marland Kitchen lisinopril (PRINIVIL,ZESTRIL) 20 MG tablet   Oral   Take 20 mg by mouth daily.         . magnesium oxide (MAG-OX) 400 MG tablet   Oral   Take 400 mg by mouth 2 (two) times daily.         . metoprolol succinate (TOPROL-XL) 25 MG 24 hr tablet   Oral   Take 25 mg by mouth 2 (two) times daily.         . montelukast (SINGULAIR) 10 MG tablet   Oral   Take 10 mg by mouth daily.          . Multiple Vitamin (MULTIVITAMIN WITH MINERALS) TABS tablet   Oral   Take 1 tablet by mouth daily.         . ondansetron (ZOFRAN) 4 MG tablet   Oral   Take 4 mg by mouth every 8 (eight) hours as needed for nausea or vomiting.          . pantoprazole (PROTONIX) 40 MG tablet   Oral   Take 40 mg by mouth daily.         . psyllium (HYDROCIL/METAMUCIL) 95 % PACK   Oral   Take 1 packet by mouth daily as needed for mild constipation.         . sennosides-docusate sodium (SENOKOT-S) 8.6-50 MG tablet   Oral   Take 2 tablets by mouth at bedtime as needed for constipation.          . sertraline (ZOLOFT) 100 MG tablet   Oral   Take 100 mg by mouth daily.          Marland Kitchen spironolactone (ALDACTONE) 25 MG tablet   Oral   Take 2 tablets (50 mg total) by mouth daily.   30  tablet   5   . thiamine 100 MG tablet   Oral   Take 1 tablet (100 mg total) by mouth daily.         . Vitamin D, Ergocalciferol, (DRISDOL) 50000 UNITS CAPS capsule   Oral   Take 50,000 Units by mouth every 7 (seven) days. Pt takes on Saturday.           Allergies Fish allergy; Iodine; Latex; Percocet; Shellfish allergy; Amitriptyline; Tape; Wellbutrin; Augmentin; Codeine; Cortisone; Diphenhydramine; Other; and Vicodin  Family History  Problem Relation Age of Onset  . Cancer Father     pancreatic  . Cancer Brother   . Cancer Maternal Aunt     Social History History  Substance Use Topics  .  Smoking status: Current Every Day Smoker -- 0.50 packs/day for 48 years    Types: Cigarettes  . Smokeless tobacco: Never Used  . Alcohol Use: No    Review of Systems Constitutional: No fever/chills Eyes: No visual changes. No eye pain. No pain over the sides of the scalp. ENT: No sore throat. Cardiovascular: Denies chest pain. Respiratory: Denies shortness of breath. Gastrointestinal: No abdominal pain.  No nausea, no vomiting.  No diarrhea.  No constipation. Genitourinary: Negative for dysuria. Musculoskeletal: Negative for back pain. See history of present illness regarding edema in the legs. Skin: Negative for rash suffer a small tear on her left forearm which she has been bandaging. Neurological: Negative for focal weakness or numbness. No trouble walking.  10-point ROS otherwise negative.  ____________________________________________   PHYSICAL EXAM:  VITAL SIGNS: ED Triage Vitals  Enc Vitals Group     BP 10/02/14 1515 144/49 mmHg     Pulse Rate 10/02/14 1515 60     Resp 10/02/14 1515 16     Temp 10/02/14 1515 97.7 F (36.5 C)     Temp Source 10/02/14 1515 Oral     SpO2 10/02/14 1515 100 %     Weight 10/02/14 1515 196 lb (88.905 kg)     Height 10/02/14 1515 5\' 4"  (1.626 m)     Head Cir --      Peak Flow --      Pain Score 10/02/14 1516 5     Pain Loc --       Pain Edu? --      Excl. in [place]? --     Constitutional: Alert and oriented. Well appearing and in no acute distress. She does have appearance of significant chronic illness and cirrhosis stigmata. Eyes: Conjunctivae are normal. PERRL. EOMI. Head: Atraumatic except for some mild tenderness over the frontal lobe without edema or bruising, also a slight amount of ecchymosis around the right superior orbit without any ocular injury or proptosis. Extraocular movements are intact. No discharge from the nose. Nose: No congestion/rhinnorhea. Mouth/Throat: Mucous membranes are moist.  Oropharynx non-erythematous. Neck: No stridor.   Cardiovascular: Normal rate, regular rhythm. Grossly normal heart sounds.  Good peripheral circulation. Respiratory: Normal respiratory effort.  No retractions. Lungs CTAB. No Rales, no crackles. No wheezing. Gastrointestinal: Soft and nontender. With mild distention which the patient reports is normal.. No abdominal bruits. No CVA tenderness. Musculoskeletal: 2+ lower extremity edema bilaterally. No erythema or evidence of infection.  No joint effusions. Neurologic:  Normal speech and language. No gross focal neurologic deficits are appreciated. No gait instability. Skin:  Skin is warm, dry and intact. Left upper extremity has a small skin tear which is bandage and healing without evidence of infection. She does have multiple ecchymoses on her hands and legs, states these are common for her because of low platelets. Psychiatric: Mood and affect are normal. Speech and behavior are normal.  ____________________________________________   LABS (all labs ordered are listed, but only abnormal results are displayed)  Labs Reviewed  CBC - Abnormal; Notable for the following:    WBC 2.4 (*)    RBC 3.13 (*)    Hemoglobin 9.2 (*)    HCT 28.1 (*)    RDW 17.7 (*)    Platelets 106 (*)    All other components within normal limits  BASIC METABOLIC PANEL - Abnormal; Notable for  the following:    Sodium 127 (*)    Chloride 89 (*)    Glucose, Bld 135 (*)  Calcium 8.2 (*)    All other components within normal limits  PROTIME-INR   ____________________________________________  EKG   ____________________________________________  RADIOLOGY   ____________________________________________   PROCEDURES  Procedure(s) performed: None  Critical Care performed: No  ____________________________________________   INITIAL IMPRESSION / ASSESSMENT AND PLAN / ED COURSE  Pertinent labs & imaging results that were available during my care of the patient were reviewed by me and considered in my medical decision making (see chart for details).  1 headache: Regarding this, the patient had a fall and is having tenderness across the area where she struck her head for a week. Her CAT scan is reassuring with no evidence intracranial injury. I believe she is likely experiencing some mild residual headache from soreness and contusion to the face and around the lower right orbit. No evidence of injury to the eye. No evidence of skull fracture. No symptoms to suggest concerning headache such as temporal arteritis, glaucoma, intracranial hemorrhage, stroke, venous sinus thrombosis.  2 lower extremity edema: Patient working with PCP to improve this. Missed cardiology appointment today. Currently taking Bumex and states she's limiting intake to 1/2 L daily. Refer her back to cardiology regarding this but will check a basic metabolic panel and may advise increasing Bumex for the next couple of days. She is seeing her PCP and has home nursing also working with this. No signs or symptoms of congestive heart failure or pulmonary edema based on examination or history. Satting normally. I will increase her Bumex to twice daily for 3 days and encourage her to follow-up with cardiology and her primary care doctor this week. Return precautions advised.  3 PICC line: Patient's right upper  extremity PICC line is noted to be nonfunctional. She has home nursing who is also aware of this, I have discussed with her that she needs to contact her PCP and contact them and let them know about PICC line dysfunction so she can have this repaired. No evidence of infection in the area.  Care instructions and return precautions discussed with the patient. She will follow-up closely with her primary care doctor in reschedule her cardiology appointment. She has in home nursing will also be following up with her. ____________________________________________   FINAL CLINICAL IMPRESSION(S) / ED DIAGNOSES  Final diagnoses:  Closed head injury, initial encounter  Peripheral edema      Delman Kitten, MD 10/02/14 [phone]

## 2014-10-02 NOTE — ED Notes (Signed)
Pt states she fell last Tuesday and hit her head, states she had bruising come up around the right eye on Saturday with no new injury and has had a HA since the fall with blurred vision,.the patient  Has a PICC line in right upper arm on arrival. Denies being on blood thinners but has liver cirrhosis.

## 2014-10-02 NOTE — ED Notes (Signed)
Pt reports that picc line is abnormal, she states that bandage came off and line appears black and it was clear before

## 2014-10-03 ENCOUNTER — Ambulatory Visit: Payer: Medicare Other | Admitting: Hematology and Oncology

## 2014-10-03 ENCOUNTER — Other Ambulatory Visit: Payer: Medicare Other

## 2014-10-03 NOTE — Telephone Encounter (Signed)
  Did you get cc'd on this message about where to fax request?  M

## 2014-10-03 NOTE — Telephone Encounter (Signed)
  Who put her PICC line in? They should probably be the ones who take it out.  M

## 2014-10-03 NOTE — Telephone Encounter (Signed)
Patient called today to state her PICC line is clotted and she needs to have it removed

## 2014-10-03 NOTE — Telephone Encounter (Signed)
An order needs to be faxed to [facility] (Dr Lucky Cowboy) Willy Eddy to remove PICC line

## 2014-10-03 NOTE — Telephone Encounter (Signed)
Order Faxed in to [facility].

## 2014-10-08 DIAGNOSIS — I272 Pulmonary hypertension, unspecified: Secondary | ICD-10-CM | POA: Insufficient documentation

## 2014-10-08 DIAGNOSIS — R600 Localized edema: Secondary | ICD-10-CM | POA: Insufficient documentation

## 2014-10-08 NOTE — Telephone Encounter (Signed)
Since this time a request was sent to Dr. Mike Gip for order to have PICC removed as it has clotted off due to not being flushed.

## 2014-10-10 ENCOUNTER — Inpatient Hospital Stay: Payer: Medicare Other

## 2014-10-10 ENCOUNTER — Inpatient Hospital Stay (HOSPITAL_BASED_OUTPATIENT_CLINIC_OR_DEPARTMENT_OTHER): Payer: Medicare Other | Admitting: Family Medicine

## 2014-10-10 ENCOUNTER — Other Ambulatory Visit: Payer: Self-pay | Admitting: Family Medicine

## 2014-10-10 VITALS — BP 146/76 | HR 69 | Wt 203.4 lb

## 2014-10-10 DIAGNOSIS — Q2733 Arteriovenous malformation of digestive system vessel: Secondary | ICD-10-CM | POA: Diagnosis not present

## 2014-10-10 DIAGNOSIS — F410 Panic disorder [episodic paroxysmal anxiety] without agoraphobia: Secondary | ICD-10-CM | POA: Diagnosis not present

## 2014-10-10 DIAGNOSIS — M129 Arthropathy, unspecified: Secondary | ICD-10-CM | POA: Diagnosis not present

## 2014-10-10 DIAGNOSIS — G8929 Other chronic pain: Secondary | ICD-10-CM | POA: Diagnosis not present

## 2014-10-10 DIAGNOSIS — I10 Essential (primary) hypertension: Secondary | ICD-10-CM

## 2014-10-10 DIAGNOSIS — I509 Heart failure, unspecified: Secondary | ICD-10-CM

## 2014-10-10 DIAGNOSIS — R011 Cardiac murmur, unspecified: Secondary | ICD-10-CM

## 2014-10-10 DIAGNOSIS — E119 Type 2 diabetes mellitus without complications: Secondary | ICD-10-CM

## 2014-10-10 DIAGNOSIS — Z86010 Personal history of colonic polyps: Secondary | ICD-10-CM | POA: Diagnosis not present

## 2014-10-10 DIAGNOSIS — F4310 Post-traumatic stress disorder, unspecified: Secondary | ICD-10-CM

## 2014-10-10 DIAGNOSIS — K7460 Unspecified cirrhosis of liver: Secondary | ICD-10-CM

## 2014-10-10 DIAGNOSIS — Z8669 Personal history of other diseases of the nervous system and sense organs: Secondary | ICD-10-CM | POA: Diagnosis not present

## 2014-10-10 DIAGNOSIS — R609 Edema, unspecified: Secondary | ICD-10-CM | POA: Diagnosis not present

## 2014-10-10 DIAGNOSIS — F42 Obsessive-compulsive disorder: Secondary | ICD-10-CM

## 2014-10-10 DIAGNOSIS — D72819 Decreased white blood cell count, unspecified: Secondary | ICD-10-CM

## 2014-10-10 DIAGNOSIS — E785 Hyperlipidemia, unspecified: Secondary | ICD-10-CM | POA: Diagnosis not present

## 2014-10-10 DIAGNOSIS — K219 Gastro-esophageal reflux disease without esophagitis: Secondary | ICD-10-CM

## 2014-10-10 DIAGNOSIS — D509 Iron deficiency anemia, unspecified: Secondary | ICD-10-CM

## 2014-10-10 DIAGNOSIS — G4730 Sleep apnea, unspecified: Secondary | ICD-10-CM | POA: Diagnosis not present

## 2014-10-10 DIAGNOSIS — K5790 Diverticulosis of intestine, part unspecified, without perforation or abscess without bleeding: Secondary | ICD-10-CM

## 2014-10-10 DIAGNOSIS — Z9181 History of falling: Secondary | ICD-10-CM | POA: Diagnosis not present

## 2014-10-10 DIAGNOSIS — F419 Anxiety disorder, unspecified: Secondary | ICD-10-CM | POA: Diagnosis not present

## 2014-10-10 DIAGNOSIS — D696 Thrombocytopenia, unspecified: Secondary | ICD-10-CM

## 2014-10-10 DIAGNOSIS — J9610 Chronic respiratory failure, unspecified whether with hypoxia or hypercapnia: Secondary | ICD-10-CM | POA: Diagnosis not present

## 2014-10-10 DIAGNOSIS — F329 Major depressive disorder, single episode, unspecified: Secondary | ICD-10-CM

## 2014-10-10 DIAGNOSIS — R161 Splenomegaly, not elsewhere classified: Secondary | ICD-10-CM

## 2014-10-10 DIAGNOSIS — Z79899 Other long term (current) drug therapy: Secondary | ICD-10-CM | POA: Diagnosis not present

## 2014-10-10 DIAGNOSIS — K589 Irritable bowel syndrome without diarrhea: Secondary | ICD-10-CM | POA: Diagnosis not present

## 2014-10-10 DIAGNOSIS — F17210 Nicotine dependence, cigarettes, uncomplicated: Secondary | ICD-10-CM | POA: Diagnosis not present

## 2014-10-10 DIAGNOSIS — M545 Low back pain: Secondary | ICD-10-CM | POA: Diagnosis not present

## 2014-10-10 LAB — CBC WITH DIFFERENTIAL/PLATELET
BASOS ABS: 0 10*3/uL (ref 0–0.1)
BASOS PCT: 1 %
Eosinophils Absolute: 0 10*3/uL (ref 0–0.7)
Eosinophils Relative: 1 %
HEMATOCRIT: 27.1 % — AB (ref 35.0–47.0)
HEMOGLOBIN: 9 g/dL — AB (ref 12.0–16.0)
Lymphocytes Relative: 18 %
Lymphs Abs: 0.6 10*3/uL — ABNORMAL LOW (ref 1.0–3.6)
MCH: 29.6 pg (ref 26.0–34.0)
MCHC: 33.1 g/dL (ref 32.0–36.0)
MCV: 89.4 fL (ref 80.0–100.0)
MONOS PCT: 6 %
Monocytes Absolute: 0.2 10*3/uL (ref 0.2–0.9)
NEUTROS ABS: 2.4 10*3/uL (ref 1.4–6.5)
NEUTROS PCT: 74 %
Platelets: 119 10*3/uL — ABNORMAL LOW (ref 150–440)
RBC: 3.03 MIL/uL — ABNORMAL LOW (ref 3.80–5.20)
RDW: 18.4 % — ABNORMAL HIGH (ref 11.5–14.5)
WBC: 3.3 10*3/uL — ABNORMAL LOW (ref 3.6–11.0)

## 2014-10-10 LAB — BASIC METABOLIC PANEL
ANION GAP: 3 — AB (ref 5–15)
BUN: 14 mg/dL (ref 6–20)
CHLORIDE: 92 mmol/L — AB (ref 101–111)
CO2: 30 mmol/L (ref 22–32)
Calcium: 7.7 mg/dL — ABNORMAL LOW (ref 8.9–10.3)
Creatinine, Ser: 0.84 mg/dL (ref 0.44–1.00)
GFR calc non Af Amer: >60 mL/min (ref >60)
Glucose, Bld: 164 mg/dL — ABNORMAL HIGH (ref 65–99)
POTASSIUM: 4.4 mmol/L (ref 3.5–5.1)
Sodium: 125 mmol/L — ABNORMAL LOW (ref 135–145)

## 2014-10-10 MED ORDER — FERROUS FUM-IRON POLYSACCH 162-115.2 MG PO CAPS: 1.0000 | ORAL_CAPSULE | Freq: Two times a day (BID) | ORAL | Status: DC

## 2014-10-10 NOTE — Progress Notes (Signed)
[place]  [phone]  [phone]     Samantha Mcmillan DOB: 1955-07-11  MR#: [id number]  NKN#:[id number]  Patient Care Team: Casilda Carls, MD as PCP - General (Internal Medicine) Christene Lye, MD (General Surgery) Alisa Graff, FNP as Nurse Practitioner (Family Medicine) Lavonia Dana, MD as Consulting Physician (Nephrology) Nathanial Rancher, MD as Referring Physician (Anesthesiology) Adrian Prows, MD as Consulting Physician (Infectious Diseases)  CHIEF COMPLAINT:  Chief Complaint  Patient presents with  . Follow-up  . Anemia   Patient with significant past medical history of IDA, duodenal AVM, diverticulosis and irritable bowel syndrome, splenomegaly, thrombocytopenia, and cirrhosis. She is also recently diagnosed with congestive heart failure.  INTERVAL HISTORY:  Patient is here for continued evaluation regarding iron deficiency anemia. Patient has been in the hospital several times for variety of different reasons. Most recently she was in the ER with congestive diastolic heart failure, a recent fall, altered mental status with hyponatremia. She has bilateral lower extremity edema with unna boots in place. She reports no active bleeding or acute blood loss to her knowledge. She is in a wheelchair today and is on oxygen.  REVIEW OF SYSTEMS:   Review of Systems  Constitutional: Positive for malaise/fatigue. Negative for fever, chills, weight loss and diaphoresis.  HENT: Negative for congestion, ear discharge, ear pain, hearing loss, nosebleeds, sore throat and tinnitus.   Eyes: Negative for blurred vision, double vision, photophobia, pain, discharge and redness.  Respiratory: Positive for cough and shortness of breath. Negative for hemoptysis, sputum production, wheezing and stridor.   Cardiovascular: Positive for leg swelling. Negative for chest pain, palpitations, orthopnea, claudication and PND.  Gastrointestinal: Negative for  heartburn, nausea, vomiting, abdominal pain, diarrhea, constipation, blood in stool and melena.  Genitourinary: Negative.   Musculoskeletal: Negative.   Skin: Negative.   Neurological: Positive for weakness. Negative for dizziness, tingling, focal weakness, seizures and headaches.  Endo/Heme/Allergies: Does not bruise/bleed easily.  Psychiatric/Behavioral: Negative for depression. The patient is not nervous/anxious and does not have insomnia.     As per HPI. Otherwise, a complete review of systems is negatve.  ONCOLOGY HISTORY:  No history exists.    PAST MEDICAL HISTORY: Past Medical History  Diagnosis Date  . GERD (gastroesophageal reflux disease)   . Colon polyp   . Cirrhosis of liver   . Hypertension   . Depression   . COPD (chronic obstructive pulmonary disease)   . IBS (irritable bowel syndrome)   . On home oxygen therapy     "2L; 24/7" (05/04/2014)  . Family history of adverse reaction to anesthesia     "my sister doesn't wake up good when she's put deep to sleep"  . High cholesterol   . Heart murmur   . Asthma   . Pneumonia "several times"  . Chronic bronchitis     "get it pretty much q yr"   . Sleep apnea     "can't tolerate CPAP" (05/04/2014)  . Type II diabetes mellitus   . History of blood transfusion     "related to my anemia"  . Anemia   . Iron deficiency anemia   . Headache     "more than a couple times/wk" (05/04/2014)  . Arthritis     "some in my feet" (05/04/2014)  . Chronic lower back pain   . Anxiety   . Acute on chronic diastolic CHF (congestive heart failure) 05/16/2014  . OCD (obsessive compulsive disorder)   . Panic attack   .  PTSD (post-traumatic stress disorder)   . Chronic respiratory failure   . Cirrhosis of liver   . IDA (iron deficiency anemia) 08/14/2014    PAST SURGICAL HISTORY: Past Surgical History  Procedure Laterality Date  . Cesarean section  1979  . Cataract extraction w/ intraocular lens  implant, bilateral Bilateral 2005    . Back surgery    . Colonoscopy  2014  . Upper gastrointestinal endoscopy    . Posterior fusion cervical spine  2015    "rebuilt 3 of my neck vertebrae"  . Incision and drainage of wound Right 2009    "leg mauled  by dog"  . Tubal ligation  1979  . Dilation and curettage of uterus    . Peripheral vascular catheterization N/A 08/16/2014    Procedure: PICC Line Insertion;  Surgeon: Algernon Huxley, MD;  Location: [facility];  Service: Cardiovascular;  Laterality: N/A;    FAMILY HISTORY Family History  Problem Relation Age of Onset  . Cancer Father     pancreatic  . Cancer Brother   . Cancer Maternal Aunt     GYNECOLOGIC HISTORY:  No LMP recorded (lmp unknown). Patient is postmenopausal.     ADVANCED DIRECTIVES:    HEALTH MAINTENANCE: Social History  Substance Use Topics  . Smoking status: Current Every Day Smoker -- 0.50 packs/day for 48 years    Types: Cigarettes  . Smokeless tobacco: Never Used  . Alcohol Use: No     Colonoscopy:  PAP:  Bone density:  Lipid panel:  Allergies  Allergen Reactions  . Fish Allergy Anaphylaxis and Swelling  . Iodine Shortness Of Breath  . Latex Anaphylaxis  . Percocet [Oxycodone-Acetaminophen] Shortness Of Breath  . Shellfish Allergy Anaphylaxis and Swelling  . Amitriptyline Other (See Comments)    Reaction:  Mental changes   . Tape Other (See Comments)    Pt states that it pulls her skin off.   . Wellbutrin [Bupropion] Other (See Comments)    Reaction:  Mental changes   . Augmentin [Amoxicillin-Pot Clavulanate] Rash  . Codeine Rash  . Cortisone Rash  . Diphenhydramine Rash  . Other Rash and Other (See Comments)    Pt states that she is allergic to Darvocet.   . Vicodin [Hydrocodone-Acetaminophen] Rash and Other (See Comments)    Reaction:  Dizziness      Current Outpatient Prescriptions  Medication Sig Dispense Refill  . acetaminophen (TYLENOL) 500 MG tablet Take 1,000 mg by mouth every 6 (six) hours as needed for  mild pain or headache.     . albuterol (PROVENTIL HFA;VENTOLIN HFA) 108 (90 BASE) MCG/ACT inhaler Inhale 2 puffs into the lungs every 4 (four) hours as needed for wheezing or shortness of breath.    Marland Kitchen albuterol (PROVENTIL) (2.5 MG/3ML) 0.083% nebulizer solution Take 2.5 mg by nebulization every 6 (six) hours as needed for wheezing or shortness of breath.    Marland Kitchen albuterol-ipratropium (COMBIVENT) 18-103 MCG/ACT inhaler Inhale 2 puffs into the lungs daily.    Marland Kitchen allopurinol (ZYLOPRIM) 100 MG tablet Take 100 mg by mouth 2 (two) times daily.     . ARIPiprazole (ABILIFY) 5 MG tablet Take 5 mg by mouth daily.     Marland Kitchen atorvastatin (LIPITOR) 10 MG tablet Take 10 mg by mouth daily.     . budesonide-formoterol (SYMBICORT) 80-4.5 MCG/ACT inhaler Inhale 2 puffs into the lungs 2 (two) times daily.     . bumetanide (BUMEX) 1 MG tablet Take 1 tablet (1 mg total) by mouth  daily. 30 tablet 0  . buprenorphine (BUTRANS - DOSED MCG/HR) 10 MCG/HR PTWK patch Place 1 patch (10 mcg total) onto the skin once a week. 4 patch 0  . clonazePAM (KLONOPIN) 0.5 MG tablet Take 1 tablet (0.5 mg total) by mouth daily. 60 tablet 0  . cyclobenzaprine (FLEXERIL) 10 MG tablet Take 10 mg by mouth 3 (three) times daily as needed for muscle spasms.    . feeding supplement, GLUCERNA SHAKE, (GLUCERNA SHAKE) LIQD Take 237 mLs by mouth 3 (three) times daily between meals.  0  . ferrous fumarate-iron polysaccharide complex (TANDEM) 162-115.2 MG CAPS Take 1 capsule by mouth 2 (two) times daily after a meal. 60 capsule 5  . gabapentin (NEURONTIN) 300 MG capsule Take 300 mg by mouth daily.     Marland Kitchen guaiFENesin (MUCINEX) 600 MG 12 hr tablet Take 600 mg by mouth daily.     Marland Kitchen HYDROmorphone (DILAUDID) 2 MG tablet Take 2 mg by mouth every 6 (six) hours as needed for severe pain.     Marland Kitchen lactulose (CHRONULAC) 10 GM/15ML solution Take 20 g by mouth 2 (two) times daily as needed for mild constipation.    Marland Kitchen lamoTRIgine (LAMICTAL) 150 MG tablet Take 150 mg by mouth at  bedtime.     Marland Kitchen levothyroxine (SYNTHROID, LEVOTHROID) 88 MCG tablet Take 88 mcg by mouth daily.    Marland Kitchen lisinopril (PRINIVIL,ZESTRIL) 20 MG tablet Take 20 mg by mouth daily.    . magnesium oxide (MAG-OX) 400 MG tablet Take 400 mg by mouth 2 (two) times daily.    . metoprolol succinate (TOPROL-XL) 25 MG 24 hr tablet Take 25 mg by mouth 2 (two) times daily.    . metoprolol tartrate (LOPRESSOR) 25 MG tablet     . montelukast (SINGULAIR) 10 MG tablet Take 10 mg by mouth daily.     . Multiple Vitamin (MULTIVITAMIN WITH MINERALS) TABS tablet Take 1 tablet by mouth daily.    . ondansetron (ZOFRAN) 4 MG tablet Take 4 mg by mouth every 8 (eight) hours as needed for nausea or vomiting.     . pantoprazole (PROTONIX) 40 MG tablet Take 40 mg by mouth daily.    . psyllium (HYDROCIL/METAMUCIL) 95 % PACK Take 1 packet by mouth daily as needed for mild constipation.    . sennosides-docusate sodium (SENOKOT-S) 8.6-50 MG tablet Take 2 tablets by mouth at bedtime as needed for constipation.     . sertraline (ZOLOFT) 100 MG tablet Take 100 mg by mouth daily.     Marland Kitchen spironolactone (ALDACTONE) 25 MG tablet Take 2 tablets (50 mg total) by mouth daily. 30 tablet 5  . thiamine 100 MG tablet Take 1 tablet (100 mg total) by mouth daily.    . Vitamin D, Ergocalciferol, (DRISDOL) 50000 UNITS CAPS capsule Take 50,000 Units by mouth every 7 (seven) days. Pt takes on Saturday.     No current facility-administered medications for this visit.    OBJECTIVE: BP 146/76 mmHg  Pulse 69  Wt 203 lb 6.4 oz (92.262 kg)  SpO2 3%  LMP  (LMP Unknown)   Body mass index is 34.9 kg/(m^2).    ECOG FS:3 - Symptomatic, >50% confined to bed  General: Well-developed, well-nourished, no acute distress. Eyes: Pink conjunctiva, anicteric sclera. HEENT: Normocephalic, moist mucous membranes, clear oropharnyx. Lungs: Clear to auscultation bilaterally. Heart: Regular rate and rhythm. No rubs, murmurs, or gallops. Musculoskeletal: No edema, cyanosis,  or clubbing. Neuro: Alert, answering all questions appropriately. Cranial nerves grossly intact. Skin: No rashes  or petechiae noted. Psych: Normal affect.   LAB RESULTS:  Appointment on 10/10/2014  Component Date Value Ref Range Status  . WBC 10/10/2014 3.3* 3.6 - 11.0 K/uL Final  . RBC 10/10/2014 3.03* 3.80 - 5.20 MIL/uL Final  . Hemoglobin 10/10/2014 9.0* 12.0 - 16.0 g/dL Final  . HCT 10/10/2014 27.1* 35.0 - 47.0 % Final  . MCV 10/10/2014 89.4  80.0 - 100.0 fL Final  . MCH 10/10/2014 29.6  26.0 - 34.0 pg Final  . MCHC 10/10/2014 33.1  32.0 - 36.0 g/dL Final  . RDW 10/10/2014 18.4* 11.5 - 14.5 % Final  . Platelets 10/10/2014 119* 150 - 440 K/uL Final  . Neutrophils Relative % 10/10/2014 74   Final  . Neutro Abs 10/10/2014 2.4  1.4 - 6.5 K/uL Final  . Lymphocytes Relative 10/10/2014 18   Final  . Lymphs Abs 10/10/2014 0.6* 1.0 - 3.6 K/uL Final  . Monocytes Relative 10/10/2014 6   Final  . Monocytes Absolute 10/10/2014 0.2  0.2 - 0.9 K/uL Final  . Eosinophils Relative 10/10/2014 1   Final  . Eosinophils Absolute 10/10/2014 0.0  0 - 0.7 K/uL Final  . Basophils Relative 10/10/2014 1   Final  . Basophils Absolute 10/10/2014 0.0  0 - 0.1 K/uL Final  . Sodium 10/10/2014 125* 135 - 145 mmol/L Final  . Potassium 10/10/2014 4.4  3.5 - 5.1 mmol/L Final  . Chloride 10/10/2014 92* 101 - 111 mmol/L Final  . CO2 10/10/2014 30  22 - 32 mmol/L Final  . Glucose, Bld 10/10/2014 164* 65 - 99 mg/dL Final  . BUN 10/10/2014 14  6 - 20 mg/dL Final  . Creatinine, Ser 10/10/2014 0.84  0.44 - 1.00 mg/dL Final  . Calcium 10/10/2014 7.7* 8.9 - 10.3 mg/dL Final  . GFR calc non Af Amer 10/10/2014 >60  >60 mL/min Final  . GFR calc Af Amer 10/10/2014 >60  >60 mL/min Final   Comment: (NOTE) The eGFR has been calculated using the CKD EPI equation. This calculation has not been validated in all clinical situations. eGFR's persistently <60 mL/min signify possible Chronic Kidney Disease.   . Anion gap  10/10/2014 3* 5 - 15 Final    STUDIES: No results found.  ASSESSMENT:  Iron deficiency anemia  PLAN:   1. IDA. Patient has very poor IV access and had previously had a PICC line in place. PICC line has clotted and home health nurse was able to remove line without complication. Patient's hemoglobin today is 9.0 but does not want to go through multiple IV sticks to receive Feraheme today. Dr. Upland Bing previously established a regimen for patients received Feraheme if hemoglobin below 11 g or ferritin below 50. At this time patient wishes to hold off and she has a myriad of other issues.  Patient has appointment scheduled for follow-up in September. Advised patient that if she needed she could be seen sooner if she became symptomatic.  Patient expressed understanding and was in agreement with this plan. She also understands that She can call clinic at any time with any questions, concerns, or complaints.   Dr. Oliva Bustard was available for consultation and review of plan of care for this patient.   Evlyn Kanner, NP   10/10/2014 11:45 AM

## 2014-10-17 ENCOUNTER — Ambulatory Visit
Admission: RE | Admit: 2014-10-17 | Discharge: 2014-10-17 | Disposition: A | Discharge status: Home / Self Care | Payer: Medicare Other | Source: Ambulatory Visit | Attending: Internal Medicine | Admitting: Internal Medicine

## 2014-10-17 ENCOUNTER — Other Ambulatory Visit: Payer: Self-pay | Admitting: Internal Medicine

## 2014-10-17 DIAGNOSIS — R601 Generalized edema: Secondary | ICD-10-CM

## 2014-10-17 DIAGNOSIS — R600 Localized edema: Secondary | ICD-10-CM | POA: Insufficient documentation

## 2014-10-24 DIAGNOSIS — L03119 Cellulitis of unspecified part of limb: Secondary | ICD-10-CM | POA: Insufficient documentation

## 2014-11-02 ENCOUNTER — Ambulatory Visit: Payer: Medicare Other | Admitting: Cardiovascular Disease

## 2014-11-14 ENCOUNTER — Ambulatory Visit: Payer: Medicare Other | Admitting: Family

## 2014-11-14 ENCOUNTER — Inpatient Hospital Stay (HOSPITAL_BASED_OUTPATIENT_CLINIC_OR_DEPARTMENT_OTHER): Payer: Medicare Other | Admitting: Internal Medicine

## 2014-11-14 ENCOUNTER — Encounter: Payer: Self-pay | Admitting: Internal Medicine

## 2014-11-14 ENCOUNTER — Ambulatory Visit: Payer: Medicare Other

## 2014-11-14 ENCOUNTER — Inpatient Hospital Stay: Payer: Medicare Other | Attending: Internal Medicine

## 2014-11-14 VITALS — BP 163/75 | HR 69 | Temp 98.0°F | Resp 18 | Ht 64.0 in | Wt 205.0 lb

## 2014-11-14 DIAGNOSIS — N189 Chronic kidney disease, unspecified: Secondary | ICD-10-CM | POA: Diagnosis not present

## 2014-11-14 DIAGNOSIS — Z79899 Other long term (current) drug therapy: Secondary | ICD-10-CM | POA: Diagnosis not present

## 2014-11-14 DIAGNOSIS — I509 Heart failure, unspecified: Secondary | ICD-10-CM | POA: Insufficient documentation

## 2014-11-14 DIAGNOSIS — M7989 Other specified soft tissue disorders: Secondary | ICD-10-CM

## 2014-11-14 DIAGNOSIS — I129 Hypertensive chronic kidney disease with stage 1 through stage 4 chronic kidney disease, or unspecified chronic kidney disease: Secondary | ICD-10-CM | POA: Diagnosis not present

## 2014-11-14 DIAGNOSIS — D649 Anemia, unspecified: Secondary | ICD-10-CM

## 2014-11-14 DIAGNOSIS — J449 Chronic obstructive pulmonary disease, unspecified: Secondary | ICD-10-CM | POA: Insufficient documentation

## 2014-11-14 DIAGNOSIS — Z9181 History of falling: Secondary | ICD-10-CM | POA: Diagnosis not present

## 2014-11-14 DIAGNOSIS — K7460 Unspecified cirrhosis of liver: Secondary | ICD-10-CM | POA: Insufficient documentation

## 2014-11-14 DIAGNOSIS — R161 Splenomegaly, not elsewhere classified: Secondary | ICD-10-CM

## 2014-11-14 DIAGNOSIS — D696 Thrombocytopenia, unspecified: Secondary | ICD-10-CM

## 2014-11-14 DIAGNOSIS — D509 Iron deficiency anemia, unspecified: Secondary | ICD-10-CM

## 2014-11-14 DIAGNOSIS — L03119 Cellulitis of unspecified part of limb: Secondary | ICD-10-CM

## 2014-11-14 DIAGNOSIS — D61818 Other pancytopenia: Secondary | ICD-10-CM

## 2014-11-14 DIAGNOSIS — E871 Hypo-osmolality and hyponatremia: Secondary | ICD-10-CM | POA: Diagnosis not present

## 2014-11-14 DIAGNOSIS — Z8719 Personal history of other diseases of the digestive system: Secondary | ICD-10-CM | POA: Insufficient documentation

## 2014-11-14 LAB — CBC WITH DIFFERENTIAL/PLATELET
BASOS ABS: 0 10*3/uL (ref 0–0.1)
Basophils Relative: 1 %
EOS ABS: 0.1 10*3/uL (ref 0–0.7)
EOS PCT: 2 %
HCT: 33 % — ABNORMAL LOW (ref 35.0–47.0)
Hemoglobin: 11 g/dL — ABNORMAL LOW (ref 12.0–16.0)
LYMPHS PCT: 23 %
Lymphs Abs: 0.7 10*3/uL — ABNORMAL LOW (ref 1.0–3.6)
MCH: 30.9 pg (ref 26.0–34.0)
MCHC: 33.4 g/dL (ref 32.0–36.0)
MCV: 92.5 fL (ref 80.0–100.0)
MONO ABS: 0.2 10*3/uL (ref 0.2–0.9)
Monocytes Relative: 7 %
Neutro Abs: 2.1 10*3/uL (ref 1.4–6.5)
Neutrophils Relative %: 67 %
PLATELETS: 133 10*3/uL — AB (ref 150–440)
RBC: 3.56 MIL/uL — ABNORMAL LOW (ref 3.80–5.20)
RDW: 17.1 % — AB (ref 11.5–14.5)
WBC: 3.1 10*3/uL — AB (ref 3.6–11.0)

## 2014-11-14 LAB — IRON AND TIBC
Iron: 55 ug/dL (ref 28–170)
Saturation Ratios: 17 % (ref 10.4–31.8)
TIBC: 327 ug/dL (ref 250–450)
UIBC: 273 ug/dL

## 2014-11-14 LAB — FERRITIN: FERRITIN: 40 ng/mL (ref 11–307)

## 2014-11-14 NOTE — Progress Notes (Signed)
[facility] OFFICE PROGRESS NOTE  Patient Care Team: Casilda Carls, MD as PCP - General (Internal Medicine) Seeplaputhur Robinette Haines, MD (General Surgery) Alisa Graff, FNP as Nurse Practitioner (Family Medicine) Lavonia Dana, MD as Consulting Physician (Nephrology) Nathanial Rancher, MD as Referring Physician (Anesthesiology) Adrian Prows, MD as Consulting Physician (Infectious Diseases)  HPI  SUMMARY of HEMATOLOGIC HISTORY:  # PANCYTOPENIA  sec to cirrhosis/spleenomegaly; Anemia sec to IDA [?duodenal AVMs] s/p IV iron; s/p EGD [2014]; s/p Colo  # COPD on 02; CHF/diastolic; CKD/Hyponaremia     INTERVAL HISTORY:  This is my first interaction with the patient since I joined the practice September 2016. I reviewed the patient's prior chart/pertinent labs/imaging in detail; findings are summarized.  59 year old female patient with a history of cirrhosis/splenomegaly of unclear etiology- is here for follow-up of pancytopenia.  Patient was recently diagnosed with lower extremity cellulitis; treated with antibiotics improving. She also has swelling in the legs from a diagnosis of CHF.  She denies any blood in stools or black stools. Her appetite is fair. Her energy levels are fairly adequate. She does have mechanical falls. She also has easy bruising.    REVIEW OF SYSTEMS:  Otherwise no fevers or chills. Complete 10 point review of system is done which is negative except mentioned above in history of present illness .   Social Hx- denies any current smoking or alcohol. Patient walks with a rolling walker. She lives with her ex-husband who is her primary caregiver.  I have reviewed the past medical history, past surgical history, social history and family history with the patient and they are unchanged from previous note unless stated above.  ALLERGIES:  is allergic to fish allergy; iodine; latex; percocet; shellfish allergy; amitriptyline; tape; wellbutrin; augmentin;  codeine; cortisone; diphenhydramine; other; and vicodin.  MEDICATIONS:  Current Outpatient Prescriptions  Medication Sig Dispense Refill  . albuterol (PROVENTIL HFA;VENTOLIN HFA) 108 (90 BASE) MCG/ACT inhaler Inhale 2 puffs into the lungs every 4 (four) hours as needed for wheezing or shortness of breath.    Marland Kitchen albuterol (PROVENTIL) (2.5 MG/3ML) 0.083% nebulizer solution Take 2.5 mg by nebulization every 6 (six) hours as needed for wheezing or shortness of breath.    . allopurinol (ZYLOPRIM) 100 MG tablet Take 100 mg by mouth 2 (two) times daily.     . ARIPiprazole (ABILIFY) 15 MG tablet Take 7.5 mg by mouth daily.    Marland Kitchen atorvastatin (LIPITOR) 10 MG tablet Take 10 mg by mouth daily.     . budesonide-formoterol (SYMBICORT) 80-4.5 MCG/ACT inhaler Inhale 2 puffs into the lungs 2 (two) times daily.     . bumetanide (BUMEX) 1 MG tablet Take 1 tablet (1 mg total) by mouth daily. (Patient taking differently: Take 1 mg by mouth 2 (two) times daily. At bedtime as needed for edema and increased fluid retention) 30 tablet 0  . buprenorphine (BUTRANS - DOSED MCG/HR) 10 MCG/HR PTWK patch Place 1 patch (10 mcg total) onto the skin once a week. (Patient taking differently: Place 10 mcg onto the skin once a week. ) 4 patch 0  . clonazePAM (KLONOPIN) 0.5 MG tablet Take 1 tablet (0.5 mg total) by mouth daily. 60 tablet 0  . cyclobenzaprine (FLEXERIL) 10 MG tablet Take 10 mg by mouth at bedtime.     . feeding supplement, GLUCERNA SHAKE, (GLUCERNA SHAKE) LIQD Take 237 mLs by mouth 3 (three) times daily between meals.  0  . ferrous fumarate-iron polysaccharide complex (TANDEM) 162-115.2 MG CAPS Take 1  capsule by mouth 2 (two) times daily after a meal. 60 capsule 5  . gabapentin (NEURONTIN) 300 MG capsule Take 300 mg by mouth daily.     Marland Kitchen guaiFENesin (MUCINEX) 600 MG 12 hr tablet Take 600 mg by mouth daily.     Marland Kitchen HYDROmorphone (DILAUDID) 2 MG tablet Take 2 mg by mouth every 6 (six) hours as needed for severe pain (neck  pain).     Marland Kitchen lactulose (CHRONULAC) 10 GM/15ML solution Take 20 g by mouth 2 (two) times daily as needed for mild constipation.    Marland Kitchen lamoTRIgine (LAMICTAL) 150 MG tablet Take 150 mg by mouth at bedtime.     Marland Kitchen levothyroxine (SYNTHROID, LEVOTHROID) 88 MCG tablet Take 88 mcg by mouth daily.    Marland Kitchen lisinopril (PRINIVIL,ZESTRIL) 20 MG tablet Take 20 mg by mouth daily.    . magnesium oxide (MAG-OX) 400 MG tablet Take 400 mg by mouth 2 (two) times daily.    . metoprolol succinate (TOPROL-XL) 25 MG 24 hr tablet Take 25 mg by mouth 2 (two) times daily.    . Misc Natural Products (FIBER 7) POWD Take by mouth daily.    . montelukast (SINGULAIR) 10 MG tablet Take 10 mg by mouth daily.     . Multiple Vitamin (MULTIVITAMIN WITH MINERALS) TABS tablet Take 1 tablet by mouth daily.    . pantoprazole (PROTONIX) 40 MG tablet Take 40 mg by mouth daily.    . sennosides-docusate sodium (SENOKOT-S) 8.6-50 MG tablet Take 2 tablets by mouth at bedtime as needed for constipation.     . sertraline (ZOLOFT) 100 MG tablet Take 100 mg by mouth daily.     Marland Kitchen spironolactone (ALDACTONE) 25 MG tablet Take 2 tablets (50 mg total) by mouth daily. 30 tablet 5  . thiamine 100 MG tablet Take 1 tablet (100 mg total) by mouth daily.    Marland Kitchen albuterol-ipratropium (COMBIVENT) 18-103 MCG/ACT inhaler Inhale 2 puffs into the lungs daily.    . ondansetron (ZOFRAN) 4 MG tablet Take 4 mg by mouth every 8 (eight) hours as needed for nausea or vomiting.      No current facility-administered medications for this visit.    PHYSICAL EXAMINATION:   BP 163/75 mmHg  Pulse 69  Temp(Src) 98 F (36.7 C) (Tympanic)  Resp 18  Ht 5\' 4"  (1.626 m)  SpO2 99%  LMP  (LMP Unknown)  There were no vitals filed for this visit.  GENERAL:alert, no distress and comfortable.Patient alone. She is walking with a rolling walker. She is able to sit on the exam table with some help.  EYES: normal, Conjunctiva are pink and non-injected, sclera clear OROPHARYNX: Poor  dentition ulceration  NECK: No thyromegaly LYMPH:  no palpable lymphadenopathy in the cervical, axillary or inguinal LUNGS: clear to auscultation with normal breathing effort; No Wheeze or crackles  Cardio-vascular- Regular Rate & Rythm and no murmurs and no lower extremity edema ABDOMEN:abdomen soft, non-tender and normal bowel sounds;positive for splenomegaly  Musculoskeletal:no cyanosis of digits and no clubbing  NEURO: alert & oriented x 3 with fluent speech, no focal motor/sensory deficits. SKIN:Multiple bruises noted on the anterior chest and bilateral upper extremities  LABORATORY DATA:  I have reviewed the data as listed    Component Value Date/Time   NA 125* 10/10/2014 1028   NA 128* 05/31/2014   NA 136 03/15/2014 1336   K 4.4 10/10/2014 1028   K 3.1* 03/15/2014 1336   CL 92* 10/10/2014 1028   CL 98 03/15/2014 1336  CO2 30 10/10/2014 1028   CO2 29 03/15/2014 1336   GLUCOSE 164* 10/10/2014 1028   GLUCOSE 57* 03/15/2014 1336   BUN 14 10/10/2014 1028   BUN 34* 05/31/2014   BUN 8 03/15/2014 1336   CREATININE 0.84 10/10/2014 1028   CREATININE 1.1 05/31/2014   CREATININE 1.05 03/15/2014 1336   CALCIUM 7.7* 10/10/2014 1028   CALCIUM 7.7* 03/15/2014 1336   PROT 6.5 08/26/2014 1834   PROT 5.9* 03/15/2014 1336   PROT 5.7* 03/15/2014 1145   ALBUMIN 3.1* 08/26/2014 1834   ALBUMIN 2.1* 03/15/2014 1336   AST 65* 08/26/2014 1834   AST 40* 03/15/2014 1336   ALT 29 08/26/2014 1834   ALT 13* 03/15/2014 1336   ALKPHOS 289* 08/26/2014 1834   ALKPHOS 221* 03/15/2014 1336   BILITOT 0.3 08/26/2014 1834   BILITOT 0.4 03/15/2014 1336   GFRNONAA >60 10/10/2014 1028   GFRNONAA 57* 03/15/2014 1336   GFRNONAA >60 03/06/2013 1855   GFRAA >60 10/10/2014 1028   GFRAA >60 03/15/2014 1336   GFRAA >60 03/06/2013 1855    No results found for: SPEP, UPEP  Lab Results  Component Value Date   WBC 3.1* 11/14/2014   NEUTROABS 2.1 11/14/2014   HGB 11.0* 11/14/2014   HCT 33.0* 11/14/2014    MCV 92.5 11/14/2014   PLT 133* 11/14/2014      Chemistry      Component Value Date/Time   NA 125* 10/10/2014 1028   NA 128* 05/31/2014   NA 136 03/15/2014 1336   K 4.4 10/10/2014 1028   K 3.1* 03/15/2014 1336   CL 92* 10/10/2014 1028   CL 98 03/15/2014 1336   CO2 30 10/10/2014 1028   CO2 29 03/15/2014 1336   BUN 14 10/10/2014 1028   BUN 34* 05/31/2014   BUN 8 03/15/2014 1336   CREATININE 0.84 10/10/2014 1028   CREATININE 1.1 05/31/2014   CREATININE 1.05 03/15/2014 1336   GLU 120 05/31/2014      Component Value Date/Time   CALCIUM 7.7* 10/10/2014 1028   CALCIUM 7.7* 03/15/2014 1336   ALKPHOS 289* 08/26/2014 1834   ALKPHOS 221* 03/15/2014 1336   AST 65* 08/26/2014 1834   AST 40* 03/15/2014 1336   ALT 29 08/26/2014 1834   ALT 13* 03/15/2014 1336   BILITOT 0.3 08/26/2014 1834   BILITOT 0.4 03/15/2014 1336       RADIOGRAPHIC STUDIES: I have personally reviewed the radiological images as listed and agreed with the findings in the report. No results found.   ASSESSMENT & PLAN:   # Chronic pancytopenia white count 3.0/hemoglobin today is 11 platelets 133; this is fairly improved from her baseline blood counts. Anemia is likely from chronic GI blood loss from her cirrhosis/question varices. No blood transfusion needed; no IV iron needed.  # Easy bruising- likely from liver disease; mildly low thrombocytopenia. I would recommend checking PT PTT at next visit.  Patient will follow up with me in approximately 6 weeks with labs. The above plan of care was discussed the patient in detail. She agrees.  Orders Placed This Encounter  Procedures  . CBC with Differential    Standing Status: Future     Number of Occurrences:      Standing Expiration Date: 11/14/2015  . Lactate dehydrogenase    Standing Status: Future     Number of Occurrences:      Standing Expiration Date: 11/14/2015  . Reticulocytes    Standing Status: Future     Number of Occurrences:  Standing  Expiration Date: 11/14/2015  . Ferritin    Standing Status: Future     Number of Occurrences:      Standing Expiration Date: 11/14/2015  . Soluble transferrin receptor    Standing Status: Future     Number of Occurrences:      Standing Expiration Date: 11/14/2015  . APTT    Standing Status: Future     Number of Occurrences:      Standing Expiration Date: 11/14/2015  . Protime-INR    Standing Status: Future     Number of Occurrences:      Standing Expiration Date: 11/14/2015  . Comprehensive metabolic panel    Standing Status: Future     Number of Occurrences:      Standing Expiration Date: 11/14/2015   All questions were answered. The patient knows to call the clinic with any problems, questions or concerns. No barriers to learning was detected. & I spent 20 minutes counseling the patient face to face. The total time spent in the appointment was 30 minutes and more than 50% was on counseling and review of test results     Cammie Sickle, MD 11/14/2014 12:19 PM

## 2014-11-14 NOTE — Progress Notes (Signed)
Patient declines influenza vaccine. States she will receive it at the primary care Samantha Mcmillan's office

## 2014-12-26 ENCOUNTER — Inpatient Hospital Stay: Payer: Medicare Other | Attending: Internal Medicine

## 2014-12-26 ENCOUNTER — Inpatient Hospital Stay (HOSPITAL_BASED_OUTPATIENT_CLINIC_OR_DEPARTMENT_OTHER): Payer: Medicare Other | Admitting: Internal Medicine

## 2014-12-26 ENCOUNTER — Encounter: Payer: Self-pay | Admitting: Internal Medicine

## 2014-12-26 VITALS — BP 157/60 | HR 62 | Temp 97.1°F | Resp 18 | Ht 64.0 in

## 2014-12-26 DIAGNOSIS — K7460 Unspecified cirrhosis of liver: Secondary | ICD-10-CM | POA: Diagnosis not present

## 2014-12-26 DIAGNOSIS — D696 Thrombocytopenia, unspecified: Secondary | ICD-10-CM

## 2014-12-26 DIAGNOSIS — R5383 Other fatigue: Secondary | ICD-10-CM | POA: Diagnosis not present

## 2014-12-26 DIAGNOSIS — Z79899 Other long term (current) drug therapy: Secondary | ICD-10-CM

## 2014-12-26 DIAGNOSIS — E871 Hypo-osmolality and hyponatremia: Secondary | ICD-10-CM | POA: Insufficient documentation

## 2014-12-26 DIAGNOSIS — D509 Iron deficiency anemia, unspecified: Secondary | ICD-10-CM

## 2014-12-26 DIAGNOSIS — D61818 Other pancytopenia: Secondary | ICD-10-CM

## 2014-12-26 DIAGNOSIS — I129 Hypertensive chronic kidney disease with stage 1 through stage 4 chronic kidney disease, or unspecified chronic kidney disease: Secondary | ICD-10-CM

## 2014-12-26 DIAGNOSIS — J449 Chronic obstructive pulmonary disease, unspecified: Secondary | ICD-10-CM | POA: Insufficient documentation

## 2014-12-26 DIAGNOSIS — N189 Chronic kidney disease, unspecified: Secondary | ICD-10-CM | POA: Diagnosis not present

## 2014-12-26 DIAGNOSIS — L0390 Cellulitis, unspecified: Secondary | ICD-10-CM | POA: Diagnosis not present

## 2014-12-26 DIAGNOSIS — R161 Splenomegaly, not elsewhere classified: Secondary | ICD-10-CM

## 2014-12-26 DIAGNOSIS — I5030 Unspecified diastolic (congestive) heart failure: Secondary | ICD-10-CM | POA: Diagnosis not present

## 2014-12-26 LAB — COMPREHENSIVE METABOLIC PANEL
ALT: 22 U/L (ref 14–54)
AST: 31 U/L (ref 15–41)
Albumin: 3.7 g/dL (ref 3.5–5.0)
Alkaline Phosphatase: 148 U/L — ABNORMAL HIGH (ref 38–126)
Anion gap: 5 (ref 5–15)
BUN: 26 mg/dL — ABNORMAL HIGH (ref 6–20)
CO2: 31 mmol/L (ref 22–32)
Calcium: 7.8 mg/dL — ABNORMAL LOW (ref 8.9–10.3)
Chloride: 86 mmol/L — ABNORMAL LOW (ref 101–111)
Creatinine, Ser: 0.85 mg/dL (ref 0.44–1.00)
Glucose, Bld: 181 mg/dL — ABNORMAL HIGH (ref 65–99)
POTASSIUM: 4.6 mmol/L (ref 3.5–5.1)
Sodium: 122 mmol/L — ABNORMAL LOW (ref 135–145)
Total Bilirubin: 0.7 mg/dL (ref 0.3–1.2)
Total Protein: 7 g/dL (ref 6.5–8.1)

## 2014-12-26 LAB — CBC WITH DIFFERENTIAL/PLATELET
Basophils Absolute: 0 10*3/uL (ref 0–0.1)
Basophils Relative: 1 %
EOS PCT: 2 %
Eosinophils Absolute: 0 10*3/uL (ref 0–0.7)
HEMATOCRIT: 37.1 % (ref 35.0–47.0)
Hemoglobin: 12.3 g/dL (ref 12.0–16.0)
LYMPHS ABS: 0.7 10*3/uL — AB (ref 1.0–3.6)
LYMPHS PCT: 20 %
MCH: 30.2 pg (ref 26.0–34.0)
MCHC: 33.1 g/dL (ref 32.0–36.0)
MCV: 91.2 fL (ref 80.0–100.0)
Monocytes Absolute: 0.2 10*3/uL (ref 0.2–0.9)
Monocytes Relative: 6 %
Neutro Abs: 2.3 10*3/uL (ref 1.4–6.5)
Neutrophils Relative %: 71 %
PLATELETS: 94 10*3/uL — AB (ref 150–440)
RBC: 4.06 MIL/uL (ref 3.80–5.20)
RDW: 14.6 % — ABNORMAL HIGH (ref 11.5–14.5)
WBC: 3.2 10*3/uL — AB (ref 3.6–11.0)

## 2014-12-26 LAB — PROTIME-INR
INR: 1.04
PROTHROMBIN TIME: 13.8 s (ref 11.4–15.0)

## 2014-12-26 LAB — FERRITIN: FERRITIN: 37 ng/mL (ref 11–307)

## 2014-12-26 LAB — APTT: aPTT: 32 seconds (ref 24–36)

## 2014-12-26 LAB — RETICULOCYTES
RBC.: 4.06 MIL/uL (ref 3.80–5.20)
RETIC COUNT ABSOLUTE: 60.9 10*3/uL (ref 19.0–183.0)
Retic Ct Pct: 1.5 % (ref 0.4–3.1)

## 2014-12-26 LAB — LACTATE DEHYDROGENASE: LDH: 93 U/L — ABNORMAL LOW (ref 98–192)

## 2014-12-26 NOTE — Progress Notes (Signed)
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 Patient continues to have easy bruising but no obvious bleeding. She has mild fatigue. Not any worse.  Cellulitis in the extremity is improved. She is off antibiotics.    REVIEW OF SYSTEMS:  Otherwise no fevers or chills. Complete 10 point review of system is done which is negative except mentioned above in history of present illness .   Social Hx- denies any current smoking or alcohol. Patient walks with a rolling walker. She lives with her ex-husband who is her primary caregiver.  I have reviewed the past medical history, past surgical history, social history and family history with the patient and they are unchanged from previous note unless stated above.  ALLERGIES:  is allergic to fish allergy; iodine; latex; percocet; shellfish allergy; amitriptyline; tape; wellbutrin; augmentin; codeine; cortisone; diphenhydramine; other; and vicodin.  MEDICATIONS:  Current Outpatient Prescriptions  Medication Sig Dispense Refill  . albuterol (PROVENTIL HFA;VENTOLIN HFA) 108 (90 BASE) MCG/ACT inhaler Inhale 2 puffs into the lungs every 4 (four) hours as needed for wheezing or shortness of breath.    Marland Kitchen albuterol (PROVENTIL) (2.5  MG/3ML) 0.083% nebulizer solution Take 2.5 mg by nebulization every 6 (six) hours as needed for wheezing or shortness of breath.    . ARIPiprazole (ABILIFY) 5 MG tablet Take 5 mg by mouth daily.    Marland Kitchen atorvastatin (LIPITOR) 10 MG tablet Take 10 mg by mouth daily.     . budesonide-formoterol (SYMBICORT) 80-4.5 MCG/ACT inhaler Inhale 2 puffs into the lungs 2 (two) times daily.     . bumetanide (BUMEX) 1 MG tablet Take 1 tablet (1 mg total) by mouth daily. (Patient taking differently: Take 1 mg by mouth 2 (two) times daily. At bedtime as needed for edema and increased fluid retention) 30 tablet 0  . buprenorphine (BUTRANS - DOSED MCG/HR) 10 MCG/HR PTWK patch Place 1 patch (10 mcg total) onto the skin once a week. (Patient taking differently: Place 10 mcg onto the skin once a week. ) 4 patch 0  . cyclobenzaprine (FLEXERIL) 10 MG tablet Take 10 mg by mouth at bedtime.     . feeding supplement, GLUCERNA SHAKE, (GLUCERNA SHAKE) LIQD Take 237 mLs by mouth 3 (three) times daily between meals.  0  . ferrous fumarate-iron polysaccharide complex (TANDEM) 162-115.2 MG CAPS Take 1 capsule by mouth 2 (two) times daily after a meal. 60 capsule 5  . gabapentin (NEURONTIN) 300 MG capsule Take 300 mg by mouth daily.     Marland Kitchen guaiFENesin (MUCINEX) 600 MG 12 hr tablet Take 600 mg by mouth daily.     Marland Kitchen HYDROmorphone (DILAUDID) 2 MG tablet Take 2 mg by mouth every 6 (six) hours as needed for severe pain (neck pain).     Marland Kitchen lactulose (CHRONULAC) 10 GM/15ML solution Take 20 g by mouth 2 (two) times daily as needed  for mild constipation.    Marland Kitchen lamoTRIgine (LAMICTAL) 150 MG tablet Take 150 mg by mouth at bedtime.     Marland Kitchen levothyroxine (SYNTHROID, LEVOTHROID) 88 MCG tablet Take 88 mcg by mouth daily.    Marland Kitchen lisinopril (PRINIVIL,ZESTRIL) 20 MG tablet Take 20 mg by mouth daily.    . magnesium oxide (MAG-OX) 400 MG tablet Take 400 mg by mouth 2 (two) times daily.    . metoCLOPramide (REGLAN) 10 MG tablet Take 10 mg by mouth 4 (four) times  daily as needed for nausea.    . metoprolol succinate (TOPROL-XL) 25 MG 24 hr tablet Take 25 mg by mouth 2 (two) times daily.    . montelukast (SINGULAIR) 10 MG tablet Take 10 mg by mouth daily.     . Multiple Vitamin (MULTIVITAMIN WITH MINERALS) TABS tablet Take 1 tablet by mouth daily.    . ondansetron (ZOFRAN) 4 MG tablet Take 4 mg by mouth every 8 (eight) hours as needed for nausea or vomiting.     . pantoprazole (PROTONIX) 40 MG tablet Take 40 mg by mouth daily.    . sennosides-docusate sodium (SENOKOT-S) 8.6-50 MG tablet Take 2 tablets by mouth at bedtime as needed for constipation.     . sertraline (ZOLOFT) 100 MG tablet Take 100 mg by mouth 2 (two) times daily.     Marland Kitchen spironolactone (ALDACTONE) 25 MG tablet Take 2 tablets (50 mg total) by mouth daily. 30 tablet 5  . allopurinol (ZYLOPRIM) 100 MG tablet Take 100 mg by mouth 2 (two) times daily.      No current facility-administered medications for this visit.    PHYSICAL EXAMINATION:   BP 157/60 mmHg  Pulse 62  Temp(Src) 97.1 F (36.2 C) (Tympanic)  Resp 18  Ht 5\' 4"  (1.626 m)  LMP  (LMP Unknown)  There were no vitals filed for this visit.  GENERAL:alert, no distress and comfortable.Patient alone. She is walking with a rolling walker. She is able to sit on the exam table with some help.  EYES: normal, Conjunctiva are pink and non-injected, sclera clear OROPHARYNX: Poor dentition ulceration  NECK: No thyromegaly LYMPH:  no palpable lymphadenopathy in the cervical, axillary or inguinal LUNGS: clear to auscultation with normal breathing effort; No Wheeze or crackles  Cardio-vascular- Regular Rate & Rythm and no murmurs and no lower extremity edema ABDOMEN:abdomen soft, non-tender and normal bowel sounds;positive for splenomegaly  Musculoskeletal:no cyanosis of digits and no clubbing  NEURO: alert & oriented x 3 with fluent speech, no focal motor/sensory deficits. SKIN:Multiple bruises noted on the anterior chest and bilateral  upper extremities  LABORATORY DATA:  I have reviewed the data as listed    Component Value Date/Time   NA 122* 12/26/2014 1042   NA 128* 05/31/2014   NA 136 03/15/2014 1336   K 4.6 12/26/2014 1042   K 3.1* 03/15/2014 1336   CL 86* 12/26/2014 1042   CL 98 03/15/2014 1336   CO2 31 12/26/2014 1042   CO2 29 03/15/2014 1336   GLUCOSE 181* 12/26/2014 1042   GLUCOSE 57* 03/15/2014 1336   BUN 26* 12/26/2014 1042   BUN 34* 05/31/2014   BUN 8 03/15/2014 1336   CREATININE 0.85 12/26/2014 1042   CREATININE 1.1 05/31/2014   CREATININE 1.05 03/15/2014 1336   CALCIUM 7.8* 12/26/2014 1042   CALCIUM 7.7* 03/15/2014 1336   PROT 7.0 12/26/2014 1042   PROT 5.9* 03/15/2014 1336   PROT 5.7* 03/15/2014 1145   ALBUMIN 3.7 12/26/2014 1042   ALBUMIN 2.1*  03/15/2014 1336   ALBUMIN 2.9* 03/15/2014 1145   AST 31 12/26/2014 1042   AST 40* 03/15/2014 1336   ALT 22 12/26/2014 1042   ALT 13* 03/15/2014 1336   ALKPHOS 148* 12/26/2014 1042   ALKPHOS 221* 03/15/2014 1336   BILITOT 0.7 12/26/2014 1042   BILITOT 0.4 03/15/2014 1336   GFRNONAA >60 12/26/2014 1042   GFRNONAA 57* 03/15/2014 1336   GFRNONAA >60 03/06/2013 1855   GFRAA >60 12/26/2014 1042   GFRAA >60 03/15/2014 1336   GFRAA >60 03/06/2013 1855    No results found for: SPEP, UPEP  Lab Results  Component Value Date   WBC 3.2* 12/26/2014   NEUTROABS 2.3 12/26/2014   HGB 12.3 12/26/2014   HCT 37.1 12/26/2014   MCV 91.2 12/26/2014   PLT 94* 12/26/2014      Chemistry      Component Value Date/Time   NA 122* 12/26/2014 1042   NA 128* 05/31/2014   NA 136 03/15/2014 1336   K 4.6 12/26/2014 1042   K 3.1* 03/15/2014 1336   CL 86* 12/26/2014 1042   CL 98 03/15/2014 1336   CO2 31 12/26/2014 1042   CO2 29 03/15/2014 1336   BUN 26* 12/26/2014 1042   BUN 34* 05/31/2014   BUN 8 03/15/2014 1336   CREATININE 0.85 12/26/2014 1042   CREATININE 1.1 05/31/2014   CREATININE 1.05 03/15/2014 1336   GLU 120 05/31/2014      Component Value  Date/Time   CALCIUM 7.8* 12/26/2014 1042   CALCIUM 7.7* 03/15/2014 1336   ALKPHOS 148* 12/26/2014 1042   ALKPHOS 221* 03/15/2014 1336   AST 31 12/26/2014 1042   AST 40* 03/15/2014 1336   ALT 22 12/26/2014 1042   ALT 13* 03/15/2014 1336   BILITOT 0.7 12/26/2014 1042   BILITOT 0.4 03/15/2014 1336       RADIOGRAPHIC STUDIES: I have personally reviewed the radiological images as listed and agreed with the findings in the report. No results found.   ASSESSMENT & PLAN:   # Chronic pancytopenia-splenomegaly and cirrhosis; iron deficiency question AVMs on by mouth iron. white count 3.0/hemoglobin today is 12 platelets 93. Recommend continued by mouth iron.  # Easy bruising- likely from liver disease; mildly low thrombocytopenia. Awaiting PT PTT from today.  # Chronic hyponatremia sodium 123 today/chronic secondary to cirrhosis/CHF. A symptomatic. Patient was given a copy of her labs.  Repeat CBC ferritin in approximately 3 months; follow-up with me in 6 months with the same labs.  No orders of the defined types were placed in this encounter.   All questions were answered. The patient knows to call the clinic with any problems, questions or concerns. No barriers to learning was detected. & I spent 15 minutes counseling the patient face to face. The total time spent in the appointment was 30 minutes and more than 50% was on counseling and review of test results     Cammie Sickle, MD 12/26/2014 11:39 AM

## 2014-12-26 NOTE — Progress Notes (Signed)
No bleeding noted, pt lost a lot of wt after being in hosp. And had fluid removed off her due to CHF.

## 2014-12-27 LAB — SOLUBLE TRANSFERRIN RECEPTOR: Transferrin Receptor: 17.7 nmol/L (ref 12.2–27.3)

## 2015-01-09 ENCOUNTER — Encounter: Payer: Self-pay | Admitting: Hematology and Oncology

## 2015-01-09 NOTE — Progress Notes (Signed)
[facility] day:  09/19/2014  Chief Complaint: Samantha Mcmillan is a 59 y.o. 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  HPI: The patient states that she has been anemic her whole life . She has received 15-16 transfusions. She has received IV iron over the past 2 years depending on her "iron level".   Notes indicate that the patient had documented iron deficiency anemia in 03/2010. She underwent an EGD and colonoscopy in the 03/2010 and 05/2010. She has a history of a duodenal AVM in 2009 noted on capsule enteroscopy. She required 2 units of packed red blood cells and Feraheme in 09/2011.    She has a history of splenomegaly she has a history of cirrhosis due to medications and resultant splenomegaly.  Platelet count has waxed and waned since 06/2010. Hepatitis C and HIV testing were negative in 02/2012. She was also noted to have negative CLL profile.  RA serologies were negative.  She has been seen by Dr. Richarda Overlie at [facility] who deferred liver transplant.  The patient was hospitalized at [facility] from 02/17- 04/13/2014 with bright red blood per rectum secondary to bleeding hemorrhoids.  She was seen by GI.  Invasive procedure were deferred secondary to her high risk from cirrhosis.  Hemoglobin range between 8.8 and 9.8.   Patient was last seen in medical oncology clinic on 06/21/2014 by Dr. Inez Pilgrim. At that time stools were dark on oral iron. There was no bleeding. She was started on oral iron.  Labs included a hemoglobin of 7.7  Labs from 09/17/2014 revealed a hematocrit 23.5,hemoglobin 7.6, MCV 88, and white count 2700. Comprehensive metabolic panel revealed a sodium of 131.  Sodium was 116 on 09/06/2014.  Chest CT scan without contrast on 12/26/2013 revealed chronic bilateral lung changes with  no acute infiltrate. Liiver ultrasound on 03/27/2014 was limited due to large volume ascites and the patient's body habitus. There was cirrhosis with portal hypertension, splenomegaly (18 cm), and ascites. Ultrasound on 08/08/2014 revealed a heterogeneous echotexture to the liver impression.  Liver was nodular and measured 16.3 cm. Spleen measured 16.5 cm.  Symptomatically, the patient notes bad hemorrhoids and occasional black stools. She is not taking oral iron. She denies any vaginal bleeding. She symptomatically she feels a little tired and sleepy. Weight fluctuates secondary to fluid issues. He has a waxing and waning hyponatremia. She also has thyroid problems. She is short of breath with exertion. She denies any nausea vomiting or diarrhea.  Patient states her diet is low in sodium. She tries to eat heart healthy with lots of vegetables and not much meat. She is on 1200 cc fluid restriction.  She is working with OT for balance issues,  She is able to use of the restroom and eat independently. She needs help with her pants.   Past Medical History  Diagnosis Date  . GERD (gastroesophageal reflux disease)   . Colon polyp   . Cirrhosis of liver ([place])   . Hypertension   . Depression   . COPD (chronic obstructive pulmonary disease) ([place])   . IBS (irritable bowel syndrome)   . On home oxygen therapy     "2L; 24/7" (05/04/2014)  . Family history of adverse reaction to anesthesia     "my sister doesn't wake up good when she's put deep to sleep"  . High cholesterol   .  Heart murmur   . Asthma   . Pneumonia "several times"  . Chronic bronchitis ([place])     "get it pretty much q yr"   . Sleep apnea     "can't tolerate CPAP" (05/04/2014)  . Type II diabetes mellitus ([place])   . History of blood transfusion     "related to my anemia"  . Anemia   . Iron deficiency anemia   . Headache     "more than a couple times/wk" (05/04/2014)  . Arthritis     "some in my feet" (05/04/2014)  . Chronic lower  back pain   . Anxiety   . Acute on chronic diastolic CHF (congestive heart failure) ([place]) 05/16/2014  . OCD (obsessive compulsive disorder)   . Panic attack   . PTSD (post-traumatic stress disorder)   . Chronic respiratory failure ([place])   . Cirrhosis of liver ([place])   . IDA (iron deficiency anemia) 08/14/2014    Past Surgical History  Procedure Laterality Date  . Cesarean section  1979  . Cataract extraction w/ intraocular lens  implant, bilateral Bilateral 2005  . Back surgery    . Colonoscopy  2014  . Upper gastrointestinal endoscopy    . Posterior fusion cervical spine  2015    "rebuilt 3 of my neck vertebrae"  . Incision and drainage of wound Right 2009    "leg mauled  by dog"  . Tubal ligation  1979  . Dilation and curettage of uterus    . Peripheral vascular catheterization N/A 08/16/2014    Procedure: PICC Line Insertion;  Surgeon: Algernon Huxley, MD;  Location: [facility];  Service: Cardiovascular;  Laterality: N/A;    Family History  Problem Relation Age of Onset  . Cancer Father     pancreatic  . Cancer Brother   . Breast cancer Maternal Aunt     Social History:  reports that she has been smoking Cigarettes.  She has a 24 pack-year smoking history. She has never used smokeless tobacco. She reports that she does not drink alcohol or use illicit drugs.  The patient is alone today.  Allergies:  Allergies  Allergen Reactions  . Fish Allergy Anaphylaxis and Swelling  . Iodine Shortness Of Breath  . Latex Anaphylaxis  . Percocet [Oxycodone-Acetaminophen] Shortness Of Breath  . Shellfish Allergy Anaphylaxis and Swelling  . Amitriptyline Other (See Comments)    Reaction:  Mental changes   . Tape Other (See Comments)    Pt states that it pulls her skin off.   . Wellbutrin [Bupropion] Other (See Comments)    Reaction:  Mental changes   . Augmentin [Amoxicillin-Pot Clavulanate] Rash  . Codeine Rash  . Cortisone Rash  . Diphenhydramine Rash  . Other Rash and  Other (See Comments)    Pt states that she is allergic to Darvocet.   . Vicodin [Hydrocodone-Acetaminophen] Rash and Other (See Comments)    Reaction:  Dizziness      Current Medications: Current Outpatient Prescriptions  Medication Sig Dispense Refill  . albuterol (PROVENTIL HFA;VENTOLIN HFA) 108 (90 BASE) MCG/ACT inhaler Inhale 2 puffs into the lungs every 4 (four) hours as needed for wheezing or shortness of breath.    Marland Kitchen albuterol (PROVENTIL) (2.5 MG/3ML) 0.083% nebulizer solution Take 2.5 mg by nebulization every 6 (six) hours as needed for wheezing or shortness of breath.    . allopurinol (ZYLOPRIM) 100 MG tablet Take 100 mg by mouth 2 (two) times daily.     . budesonide-formoterol (  SYMBICORT) 80-4.5 MCG/ACT inhaler Inhale 2 puffs into the lungs 2 (two) times daily.     . bumetanide (BUMEX) 1 MG tablet Take 1 tablet (1 mg total) by mouth daily. (Patient taking differently: Take 1 mg by mouth 2 (two) times daily. At bedtime as needed for edema and increased fluid retention) 30 tablet 0  . buprenorphine (BUTRANS - DOSED MCG/HR) 10 MCG/HR PTWK patch Place 1 patch (10 mcg total) onto the skin once a week. (Patient taking differently: Place 10 mcg onto the skin once a week. ) 4 patch 0  . cyclobenzaprine (FLEXERIL) 10 MG tablet Take 10 mg by mouth at bedtime.     . feeding supplement, GLUCERNA SHAKE, (GLUCERNA SHAKE) LIQD Take 237 mLs by mouth 3 (three) times daily between meals.  0  . gabapentin (NEURONTIN) 300 MG capsule Take 300 mg by mouth daily.     Marland Kitchen guaiFENesin (MUCINEX) 600 MG 12 hr tablet Take 600 mg by mouth daily.     Marland Kitchen HYDROmorphone (DILAUDID) 2 MG tablet Take 2 mg by mouth every 6 (six) hours as needed for severe pain (neck pain).     Marland Kitchen lactulose (CHRONULAC) 10 GM/15ML solution Take 20 g by mouth 2 (two) times daily as needed for mild constipation.    Marland Kitchen lamoTRIgine (LAMICTAL) 150 MG tablet Take 150 mg by mouth at bedtime.     Marland Kitchen levothyroxine (SYNTHROID, LEVOTHROID) 88 MCG tablet  Take 88 mcg by mouth daily.    Marland Kitchen lisinopril (PRINIVIL,ZESTRIL) 20 MG tablet Take 20 mg by mouth daily.    . metoprolol succinate (TOPROL-XL) 25 MG 24 hr tablet Take 25 mg by mouth 2 (two) times daily.    . montelukast (SINGULAIR) 10 MG tablet Take 10 mg by mouth daily.     . Multiple Vitamin (MULTIVITAMIN WITH MINERALS) TABS tablet Take 1 tablet by mouth daily.    . ondansetron (ZOFRAN) 4 MG tablet Take 4 mg by mouth every 8 (eight) hours as needed for nausea or vomiting.     . pantoprazole (PROTONIX) 40 MG tablet Take 40 mg by mouth daily.    . sennosides-docusate sodium (SENOKOT-S) 8.6-50 MG tablet Take 2 tablets by mouth at bedtime as needed for constipation.     . sertraline (ZOLOFT) 100 MG tablet Take 100 mg by mouth 2 (two) times daily.     Marland Kitchen spironolactone (ALDACTONE) 25 MG tablet Take 2 tablets (50 mg total) by mouth daily. 30 tablet 5  . ARIPiprazole (ABILIFY) 5 MG tablet Take 5 mg by mouth daily.    Marland Kitchen atorvastatin (LIPITOR) 10 MG tablet Take 10 mg by mouth daily.     . ferrous fumarate-iron polysaccharide complex (TANDEM) 162-115.2 MG CAPS Take 1 capsule by mouth 2 (two) times daily after a meal. 60 capsule 5  . magnesium oxide (MAG-OX) 400 MG tablet Take 400 mg by mouth 2 (two) times daily.    . metoCLOPramide (REGLAN) 10 MG tablet Take 10 mg by mouth 4 (four) times daily as needed for nausea.     No current facility-administered medications for this visit.    Review of Systems:  GENERAL:  Tied and sleepy.  "Whole body is one big mass". No fevers, sweats.  Weight fluctuates depending on fluids. PERFORMANCE STATUS (ECOG):  3 HEENT:  No visual changes, runny nose, sore throat, mouth sores or tenderness. Lungs: No shortness of breath or cough.  No hemoptysis. 2 liter oxygen. Cardiac:  No chest pain, palpitations, orthopnea, or PND. GI:  No nausea,  vomiting, diarrhea, constipation, melena or hematochezia. GU:  No urgency, frequency, dysuria, or hematuria. Musculoskeletal:  No back  pain.  No joint pain.  No muscle tenderness. Extremities:  No pain or swelling. Skin:  No rashes or skin changes. Neuro:  No headache, numbness or weakness, balance or coordination issues. Endocrine:  No diabetes, thyroid issues, hot flashes or night sweats. Psych:  No mood changes, depression or anxiety. Pain:  No focal pain. Review of systems:  All other systems reviewed and found to be negative.  Physical Exam: Blood pressure 160/65, pulse 83, temperature 96.5 F (35.8 C), temperature source Tympanic, resp. rate 18, height 5\' 4"  (1.626 m), weight 192 lb 9.2 oz (87.35 kg). GENERAL:  Chronically ill appearing woman sitting comfortably on a seated rolling walker in the exam room in no acute distress.  MENTAL STATUS:  Alert and oriented to person, place and time. HEAD:  Long brown hair.  Normocephalic, atraumatic, face symmetric, no Cushingoid features. EYES:  Pupils equal round and reactive to light and accomodation.  No conjunctivitis or scleral icterus. ENT:  Oropharynx clear without lesion.  Tongue normal. Mucous membranes moist.  RESPIRATORY:  Clear to auscultation without rales, wheezes or rhonchi. CARDIOVASCULAR:  Regular rate and rhythm without murmur, rub or gallop. ABDOMEN:  Soft, non-tender, with active bowel sounds, and no hepatomegaly.  Spleen palpable 5 FB below left costal margin. No masses. SKIN:  Scarring upper extremities.  Upper extremity bruises.  No rashes, ulcers or lesions. EXTREMITIES: Legs wrapped.  Lower extremity swelling.  No skin discoloration or tenderness.  No palpable cords. LYMPH NODES: No palpable cervical, supraclavicular, axillary or inguinal adenopathy  NEUROLOGICAL: Unremarkable. PSYCH:  Appropriate.  Office Visit on 09/19/2014  Component Date Value Ref Range Status  . Order Confirmation 09/19/2014 ORDER PROCESSED BY BLOOD BANK   Final  . WBC 09/19/2014 3.0* 3.6 - 11.0 K/uL Final  . RBC 09/19/2014 2.83* 3.80 - 5.20 MIL/uL Final  . Hemoglobin  09/19/2014 8.2* 12.0 - 16.0 g/dL Final  . HCT 09/19/2014 24.9* 35.0 - 47.0 % Final  . MCV 09/19/2014 88.0  80.0 - 100.0 fL Final  . MCH 09/19/2014 28.9  26.0 - 34.0 pg Final  . MCHC 09/19/2014 32.8  32.0 - 36.0 g/dL Final  . RDW 09/19/2014 15.1* 11.5 - 14.5 % Final  . Platelets 09/19/2014 114* 150 - 440 K/uL Final  . Neutrophils Relative % 09/19/2014 70%   Final  . Neutro Abs 09/19/2014 2.1  1.4 - 6.5 K/uL Final  . Lymphocytes Relative 09/19/2014 21%   Final  . Lymphs Abs 09/19/2014 0.6* 1.0 - 3.6 K/uL Final  . Monocytes Relative 09/19/2014 7%   Final  . Monocytes Absolute 09/19/2014 0.2  0.2 - 0.9 K/uL Final  . Eosinophils Relative 09/19/2014 1%   Final  . Eosinophils Absolute 09/19/2014 0.0  0 - 0.7 K/uL Final  . Basophils Relative 09/19/2014 1%   Final  . Basophils Absolute 09/19/2014 0.0  0 - 0.1 K/uL Final  . Ferritin 09/19/2014 14  11 - 307 ng/mL Final  . Iron 09/19/2014 19* 28 - 170 ug/dL Final  . TIBC 09/19/2014 345  250 - 450 ug/dL Final  . Saturation Ratios 09/19/2014 6* 10.4 - 31.8 % Final  . UIBC 09/19/2014 326   Final  . Sed Rate 09/19/2014 44* 0 - 30 mm/hr Final  . Vitamin B-12 09/19/2014 505  180 - 914 pg/mL Final   Comment: (NOTE) This assay is not validated for testing neonatal or myeloproliferative syndrome  specimens for Vitamin B12 levels. Performed at [facility]   . Folate 09/19/2014 19.8  >5.9 ng/mL Final  . Retic Ct Pct 09/19/2014 1.3  0.4 - 3.1 % Final  . RBC. 09/19/2014 2.83* 3.80 - 5.20 MIL/uL Final  . Retic Count, Manual 09/19/2014 36.8  19.0 - 183.0 K/uL Final  . Anit Nuclear Antibody(ANA) 09/19/2014 Negative  Negative Final   Comment: (NOTE) Performed At: [facility] [place], [place] [id number] Lindon Romp MD [id number]   . ABO/RH(D) 09/19/2014 A NEG   Final  . Antibody Screen 09/19/2014 NEG   Final  . Sample Expiration 09/19/2014 09/22/2014   Final  . Unit Number 09/19/2014 [id number]   Final  . Blood  Component Type 09/19/2014 RBC LR PHER2   Final  . Unit division 09/19/2014 00   Final  . Status of Unit 09/19/2014 ISSUED,FINAL   Final  . Transfusion Status 09/19/2014 OK TO TRANSFUSE   Final  . Crossmatch Result 09/19/2014 Compatible   Final    Assessment:  Samantha Mcmillan is a 59 y.o. female with a history of iron deficiency anemia as well as thrombocytopenia secondary to cirrhosis and resultant splenomegaly.  She has received numerous packed red blood cells years. She has received IV iron if hemoglobin is below 11 or ferritin is below 50.  She underwent an EGD and colonoscopy in the 03/2010 and 05/2010. She has a history of a duodenal AVM in 2009 noted on capsule enteroscopy. She is felt to be high risk for future procedure secondary to her cirrhosis.  She is felt not to be a candidate for liver transplant.  The patient's diet is modest. She does not eat much meat..  She is fatigued.  Hematocrit is 24.9 with a ferritin of 14.  Plan: 1. Discuss entire medical history, diagnosis and management of the anemia and thrombocytopenia. 2. Labs today:  CBC with differential, ferritin, iron studies, sedimentation rate, B12, folate, reticulocyte count, and ANA. 3. This patient's last about transfusion or IV iron. Decision was made to return tomorrow for transfusion. 4. RTC tomorrow for a transfusion of packed red blood cells 5. RTC in 2 weeks for MD assess and labs.   Lequita Asal, MD  09/19/2014

## 2015-03-28 ENCOUNTER — Telehealth: Payer: Self-pay | Admitting: *Deleted

## 2015-03-28 ENCOUNTER — Inpatient Hospital Stay: Payer: Medicare Other | Attending: Internal Medicine

## 2015-03-28 DIAGNOSIS — D509 Iron deficiency anemia, unspecified: Secondary | ICD-10-CM | POA: Insufficient documentation

## 2015-03-28 LAB — CBC WITH DIFFERENTIAL/PLATELET
Basophils Absolute: 0.1 10*3/uL (ref 0–0.1)
Basophils Relative: 1 %
Eosinophils Absolute: 0.1 10*3/uL (ref 0–0.7)
Eosinophils Relative: 2 %
HEMATOCRIT: 35.3 % (ref 35.0–47.0)
Hemoglobin: 12 g/dL (ref 12.0–16.0)
LYMPHS PCT: 23 %
Lymphs Abs: 1.4 10*3/uL (ref 1.0–3.6)
MCH: 31.5 pg (ref 26.0–34.0)
MCHC: 34.1 g/dL (ref 32.0–36.0)
MCV: 92.2 fL (ref 80.0–100.0)
MONOS PCT: 7 %
Monocytes Absolute: 0.4 10*3/uL (ref 0.2–0.9)
NEUTROS ABS: 4 10*3/uL (ref 1.4–6.5)
Neutrophils Relative %: 67 %
Platelets: 136 10*3/uL — ABNORMAL LOW (ref 150–440)
RBC: 3.83 MIL/uL (ref 3.80–5.20)
RDW: 15.6 % — AB (ref 11.5–14.5)
WBC: 5.9 10*3/uL (ref 3.6–11.0)

## 2015-03-28 LAB — FERRITIN: Ferritin: 44 ng/mL (ref 11–307)

## 2015-03-28 NOTE — Telephone Encounter (Signed)
-----   Message from Cammie Sickle, MD sent at 03/28/2015  3:21 PM EST ----- Please inform patient that her labs are stable; continue by mouth iron. Follow-up as planned.

## 2015-04-01 NOTE — Telephone Encounter (Signed)
Spoke with husband. Husband will give the patient the message.

## 2015-04-08 ENCOUNTER — Telehealth: Payer: Self-pay | Admitting: *Deleted

## 2015-04-08 NOTE — Telephone Encounter (Signed)
Attempted to call patient as prior attempts in the past have been unsuccessful to schedule annual follow up lung cancer screening scan. Unable to reach patient or leave message.

## 2015-04-15 ENCOUNTER — Emergency Department: Payer: Medicare Other

## 2015-04-15 ENCOUNTER — Inpatient Hospital Stay
Admission: EM | Admit: 2015-04-15 | Discharge: 2015-04-17 | DRG: 640 | Disposition: A | Discharge status: Home / Self Care | Payer: Medicare Other | Attending: Internal Medicine | Admitting: Internal Medicine

## 2015-04-15 DIAGNOSIS — N179 Acute kidney failure, unspecified: Secondary | ICD-10-CM

## 2015-04-15 DIAGNOSIS — Z23 Encounter for immunization: Secondary | ICD-10-CM

## 2015-04-15 DIAGNOSIS — Z888 Allergy status to other drugs, medicaments and biological substances status: Secondary | ICD-10-CM

## 2015-04-15 DIAGNOSIS — E039 Hypothyroidism, unspecified: Secondary | ICD-10-CM | POA: Diagnosis present

## 2015-04-15 DIAGNOSIS — Z79899 Other long term (current) drug therapy: Secondary | ICD-10-CM

## 2015-04-15 DIAGNOSIS — J449 Chronic obstructive pulmonary disease, unspecified: Secondary | ICD-10-CM | POA: Diagnosis present

## 2015-04-15 DIAGNOSIS — S51811A Laceration without foreign body of right forearm, initial encounter: Secondary | ICD-10-CM | POA: Diagnosis present

## 2015-04-15 DIAGNOSIS — R4182 Altered mental status, unspecified: Secondary | ICD-10-CM

## 2015-04-15 DIAGNOSIS — Z803 Family history of malignant neoplasm of breast: Secondary | ICD-10-CM

## 2015-04-15 DIAGNOSIS — J45909 Unspecified asthma, uncomplicated: Secondary | ICD-10-CM | POA: Diagnosis present

## 2015-04-15 DIAGNOSIS — J9610 Chronic respiratory failure, unspecified whether with hypoxia or hypercapnia: Secondary | ICD-10-CM | POA: Diagnosis present

## 2015-04-15 DIAGNOSIS — I5032 Chronic diastolic (congestive) heart failure: Secondary | ICD-10-CM | POA: Diagnosis present

## 2015-04-15 DIAGNOSIS — M1990 Unspecified osteoarthritis, unspecified site: Secondary | ICD-10-CM | POA: Diagnosis present

## 2015-04-15 DIAGNOSIS — E871 Hypo-osmolality and hyponatremia: Principal | ICD-10-CM | POA: Diagnosis present

## 2015-04-15 DIAGNOSIS — Z7951 Long term (current) use of inhaled steroids: Secondary | ICD-10-CM

## 2015-04-15 DIAGNOSIS — Z885 Allergy status to narcotic agent status: Secondary | ICD-10-CM

## 2015-04-15 DIAGNOSIS — E860 Dehydration: Secondary | ICD-10-CM | POA: Diagnosis present

## 2015-04-15 DIAGNOSIS — I10 Essential (primary) hypertension: Secondary | ICD-10-CM | POA: Diagnosis present

## 2015-04-15 DIAGNOSIS — Z961 Presence of intraocular lens: Secondary | ICD-10-CM | POA: Diagnosis present

## 2015-04-15 DIAGNOSIS — Z9842 Cataract extraction status, left eye: Secondary | ICD-10-CM

## 2015-04-15 DIAGNOSIS — E7800 Pure hypercholesterolemia, unspecified: Secondary | ICD-10-CM | POA: Diagnosis present

## 2015-04-15 DIAGNOSIS — Z9981 Dependence on supplemental oxygen: Secondary | ICD-10-CM

## 2015-04-15 DIAGNOSIS — Z9841 Cataract extraction status, right eye: Secondary | ICD-10-CM

## 2015-04-15 DIAGNOSIS — G9340 Encephalopathy, unspecified: Secondary | ICD-10-CM

## 2015-04-15 DIAGNOSIS — F4310 Post-traumatic stress disorder, unspecified: Secondary | ICD-10-CM | POA: Diagnosis present

## 2015-04-15 DIAGNOSIS — F17210 Nicotine dependence, cigarettes, uncomplicated: Secondary | ICD-10-CM | POA: Diagnosis present

## 2015-04-15 DIAGNOSIS — Z9851 Tubal ligation status: Secondary | ICD-10-CM

## 2015-04-15 DIAGNOSIS — N170 Acute kidney failure with tubular necrosis: Secondary | ICD-10-CM | POA: Diagnosis present

## 2015-04-15 DIAGNOSIS — Z9889 Other specified postprocedural states: Secondary | ICD-10-CM

## 2015-04-15 DIAGNOSIS — G8929 Other chronic pain: Secondary | ICD-10-CM | POA: Diagnosis present

## 2015-04-15 DIAGNOSIS — L03116 Cellulitis of left lower limb: Secondary | ICD-10-CM | POA: Diagnosis present

## 2015-04-15 DIAGNOSIS — R296 Repeated falls: Secondary | ICD-10-CM | POA: Diagnosis present

## 2015-04-15 DIAGNOSIS — Z86010 Personal history of colonic polyps: Secondary | ICD-10-CM

## 2015-04-15 DIAGNOSIS — K7460 Unspecified cirrhosis of liver: Secondary | ICD-10-CM | POA: Diagnosis present

## 2015-04-15 DIAGNOSIS — K219 Gastro-esophageal reflux disease without esophagitis: Secondary | ICD-10-CM | POA: Diagnosis present

## 2015-04-15 DIAGNOSIS — K589 Irritable bowel syndrome without diarrhea: Secondary | ICD-10-CM | POA: Diagnosis present

## 2015-04-15 DIAGNOSIS — L03115 Cellulitis of right lower limb: Secondary | ICD-10-CM | POA: Diagnosis present

## 2015-04-15 DIAGNOSIS — R269 Unspecified abnormalities of gait and mobility: Secondary | ICD-10-CM | POA: Diagnosis present

## 2015-04-15 DIAGNOSIS — Z91040 Latex allergy status: Secondary | ICD-10-CM

## 2015-04-15 DIAGNOSIS — R011 Cardiac murmur, unspecified: Secondary | ICD-10-CM | POA: Diagnosis present

## 2015-04-15 DIAGNOSIS — E119 Type 2 diabetes mellitus without complications: Secondary | ICD-10-CM | POA: Diagnosis present

## 2015-04-15 DIAGNOSIS — Z886 Allergy status to analgesic agent status: Secondary | ICD-10-CM

## 2015-04-15 DIAGNOSIS — W19XXXA Unspecified fall, initial encounter: Secondary | ICD-10-CM | POA: Diagnosis present

## 2015-04-15 LAB — CBC
HCT: 33.5 % — ABNORMAL LOW (ref 35.0–47.0)
HEMOGLOBIN: 11.2 g/dL — AB (ref 12.0–16.0)
MCH: 31.1 pg (ref 26.0–34.0)
MCHC: 33.4 g/dL (ref 32.0–36.0)
MCV: 93.2 fL (ref 80.0–100.0)
PLATELETS: 120 10*3/uL — AB (ref 150–440)
RBC: 3.59 MIL/uL — ABNORMAL LOW (ref 3.80–5.20)
RDW: 15.6 % — AB (ref 11.5–14.5)
WBC: 6.8 10*3/uL (ref 3.6–11.0)

## 2015-04-15 LAB — URINALYSIS COMPLETE WITH MICROSCOPIC (ARMC ONLY)
Bacteria, UA: NONE SEEN
Bilirubin Urine: NEGATIVE
GLUCOSE, UA: NEGATIVE mg/dL
Hgb urine dipstick: NEGATIVE
Ketones, ur: NEGATIVE mg/dL
Leukocytes, UA: NEGATIVE
Nitrite: NEGATIVE
Protein, ur: NEGATIVE mg/dL
SPECIFIC GRAVITY, URINE: 1.006 (ref 1.005–1.030)
pH: 6 (ref 5.0–8.0)

## 2015-04-15 LAB — HEPATIC FUNCTION PANEL
ALT: 14 U/L (ref 14–54)
AST: 28 U/L (ref 15–41)
Albumin: 3.6 g/dL (ref 3.5–5.0)
Alkaline Phosphatase: 108 U/L (ref 38–126)
BILIRUBIN DIRECT: 0.2 mg/dL (ref 0.1–0.5)
Indirect Bilirubin: 0.4 mg/dL (ref 0.3–0.9)
Total Bilirubin: 0.6 mg/dL (ref 0.3–1.2)
Total Protein: 6.8 g/dL (ref 6.5–8.1)

## 2015-04-15 LAB — BASIC METABOLIC PANEL
Anion gap: 9 (ref 5–15)
BUN: 51 mg/dL — AB (ref 6–20)
CALCIUM: 8.9 mg/dL (ref 8.9–10.3)
CO2: 30 mmol/L (ref 22–32)
CREATININE: 1.53 mg/dL — AB (ref 0.44–1.00)
Chloride: 87 mmol/L — ABNORMAL LOW (ref 101–111)
GFR calc Af Amer: 41 mL/min — ABNORMAL LOW (ref >60)
GFR calc non Af Amer: 36 mL/min — ABNORMAL LOW (ref >60)
GLUCOSE: 203 mg/dL — AB (ref 65–99)
Potassium: 4.2 mmol/L (ref 3.5–5.1)
Sodium: 126 mmol/L — ABNORMAL LOW (ref 135–145)

## 2015-04-15 LAB — PROTIME-INR
INR: 1.07
Prothrombin Time: 14.1 seconds (ref 11.4–15.0)

## 2015-04-15 LAB — LIPASE, BLOOD: Lipase: 19 U/L (ref 11–51)

## 2015-04-15 LAB — AMMONIA: Ammonia: 28 umol/L (ref 9–35)

## 2015-04-15 MED ORDER — SODIUM CHLORIDE 0.9 % IV BOLUS (SEPSIS)
500.0000 mL | Freq: Once | INTRAVENOUS | Status: AC
  Administered 2015-04-15: 500 mL via INTRAVENOUS

## 2015-04-15 MED ORDER — VANCOMYCIN HCL IN DEXTROSE 1-5 GM/200ML-% IV SOLN
1000.0000 mg | Freq: Once | INTRAVENOUS | Status: AC
  Administered 2015-04-15: 1000 mg via INTRAVENOUS
  Filled 2015-04-15: qty 200

## 2015-04-15 NOTE — ED Notes (Signed)
Pt states she has been having increased weakness/dizziness, states she had labs drawn and PCP last week and showed low Na+ levels and was suppose to go back for repeats this week.. States she fell last week and again last night, states she hit her head last night.. Denies LOC.. Pt has bandages on BL arms from skin tears.Marland Kitchen

## 2015-04-15 NOTE — ED Provider Notes (Signed)
[facility] Emergency Department Provider Note  ____________________________________________  Time seen: Approximately 6:33 PM  I have reviewed the triage vital signs and the nursing notes.   HISTORY  Chief Complaint Weakness and Fall  EM caveat: Some limitations due to patient slight confusion  HPI Samantha Mcmillan is a 60 y.o. female presents for evaluation for confusion and frequent falls.  Patient's husband reports that for the last couple days she's been somewhat weak, dizzy and tired. Is concerned she may have "cellulitis" of her legs that she has had this previously a few months ago. In addition he states that she has cirrhosis, low sodium and has been fairly confused, occasionally disoriented, and fell last night. She is not able to stand or sit up well without feeling lightheaded as though she is going to pass out or fall over. There is not been any facial droop, slurred speech. She feels "weak all over". Husband reports this has happened 3 or 4 times in the past and she's required hospitalization.  She did strike her head last night has a little bruise over the forehead, but did not lose consciousness per the husband. Patient denies headache. Past Medical History  Diagnosis Date  . GERD (gastroesophageal reflux disease)   . Colon polyp   . Cirrhosis of liver ([place])   . Hypertension   . Depression   . COPD (chronic obstructive pulmonary disease) ([place])   . IBS (irritable bowel syndrome)   . On home oxygen therapy     "2L; 24/7" (05/04/2014)  . Family history of adverse reaction to anesthesia     "my sister doesn't wake up good when she's put deep to sleep"  . High cholesterol   . Heart murmur   . Asthma   . Pneumonia "several times"  . Chronic bronchitis ([place])     "get it pretty much q yr"   . Sleep apnea     "can't tolerate CPAP" (05/04/2014)  . Type II diabetes mellitus ([place])   . History of blood transfusion     "related to my anemia"  .  Anemia   . Iron deficiency anemia   . Headache     "more than a couple times/wk" (05/04/2014)  . Arthritis     "some in my feet" (05/04/2014)  . Chronic lower back pain   . Anxiety   . Acute on chronic diastolic CHF (congestive heart failure) ([place]) 05/16/2014  . OCD (obsessive compulsive disorder)   . Panic attack   . PTSD (post-traumatic stress disorder)   . Chronic respiratory failure ([place])   . Cirrhosis of liver ([place])   . IDA (iron deficiency anemia) 08/14/2014    Patient Active Problem List   Diagnosis Date Noted  . Fall 04/16/2015  . Bacterial skin infection of leg 10/24/2014  . Edema leg 10/08/2014  . Hypertensive pulmonary vascular disease ([place]) 10/08/2014  . Chronic diastolic heart failure ([place]) 09/21/2014  . Tobacco use 09/21/2014  . Thrombocytopenia ([place]) 09/19/2014  . Leukopenia 09/19/2014  . Pancytopenia ([place]) 09/06/2014  . IDA (iron deficiency anemia) 08/14/2014  . Ascites 07/25/2014  . Cellulitis and abscess of lower extremity 07/07/2014  . Encephalopathy, hepatic ([place]) 05/18/2014  . Polypharmacy 05/18/2014  . Failure to thrive in adult 05/16/2014  . Hepatic cirrhosis ([place]) 05/16/2014  . Protein-calorie malnutrition, severe ([place]) 05/16/2014  . Advanced COPD ([place]) 05/16/2014  . Chronic back pain 05/16/2014  . Weakness 05/15/2014  . Hyponatremia 05/15/2014  . HCAP (healthcare-associated pneumonia) 05/09/2014  .  Urinary retention 05/09/2014  . DM2 (diabetes mellitus, type 2) ([place]) 05/09/2014  . Hypothyroidism 05/05/2014    Past Surgical History  Procedure Laterality Date  . Cesarean section  1979  . Cataract extraction w/ intraocular lens  implant, bilateral Bilateral 2005  . Back surgery    . Colonoscopy  2014  . Upper gastrointestinal endoscopy    . Posterior fusion cervical spine  2015    "rebuilt 3 of my neck vertebrae"  . Incision and drainage of wound Right 2009    "leg mauled  by dog"  . Tubal ligation  1979  . Dilation and curettage of uterus     . Peripheral vascular catheterization N/A 08/16/2014    Procedure: PICC Line Insertion;  Surgeon: Algernon Huxley, MD;  Location: [facility];  Service: Cardiovascular;  Laterality: N/A;    Current Outpatient Rx  Name  Route  Sig  Dispense  Refill  . albuterol (PROVENTIL HFA;VENTOLIN HFA) 108 (90 BASE) MCG/ACT inhaler   Inhalation   Inhale 2 puffs into the lungs every 4 (four) hours as needed for wheezing or shortness of breath.         Marland Kitchen albuterol (PROVENTIL) (2.5 MG/3ML) 0.083% nebulizer solution   Nebulization   Take 2.5 mg by nebulization every 6 (six) hours as needed for wheezing or shortness of breath.         . allopurinol (ZYLOPRIM) 100 MG tablet   Oral   Take 100 mg by mouth 2 (two) times daily.          . ARIPiprazole (ABILIFY) 5 MG tablet   Oral   Take 5 mg by mouth at bedtime.          Marland Kitchen atorvastatin (LIPITOR) 10 MG tablet   Oral   Take 10 mg by mouth at bedtime.          . budesonide-formoterol (SYMBICORT) 80-4.5 MCG/ACT inhaler   Inhalation   Inhale 2 puffs into the lungs 2 (two) times daily.          . Buprenorphine (BUTRANS) 15 MCG/HR PTWK   Transdermal   Place 15 mcg onto the skin once a week. Pt applies on Sunday.         . cyclobenzaprine (FLEXERIL) 10 MG tablet   Oral   Take 10 mg by mouth 3 (three) times daily as needed for muscle spasms.          . ferrous fumarate-iron polysaccharide complex (TANDEM) 162-115.2 MG CAPS   Oral   Take 1 capsule by mouth 2 (two) times daily after a meal.   60 capsule   5   . fluticasone (FLONASE) 50 MCG/ACT nasal spray   Each Nare   Place 1-2 sprays into both nostrils daily as needed for rhinitis.         Marland Kitchen gabapentin (NEURONTIN) 300 MG capsule   Oral   Take 300 mg by mouth at bedtime.          Marland Kitchen guaiFENesin (MUCINEX) 600 MG 12 hr tablet   Oral   Take 600 mg by mouth 2 (two) times daily as needed for cough or to loosen phlegm.          Marland Kitchen HYDROmorphone (DILAUDID) 2 MG tablet    Oral   Take 2 mg by mouth every 6 (six) hours as needed for severe pain.          Marland Kitchen lactulose (CHRONULAC) 10 GM/15ML solution   Oral   Take 20  g by mouth 2 (two) times daily as needed for mild constipation.         Marland Kitchen lamoTRIgine (LAMICTAL) 150 MG tablet   Oral   Take 150 mg by mouth at bedtime.          Marland Kitchen levothyroxine (SYNTHROID, LEVOTHROID) 88 MCG tablet   Oral   Take 88 mcg by mouth daily before breakfast.          . magnesium oxide (MAG-OX) 400 MG tablet   Oral   Take 400 mg by mouth 2 (two) times daily.         . metoCLOPramide (REGLAN) 10 MG tablet   Oral   Take 10 mg by mouth 4 (four) times daily as needed for nausea or vomiting.          . metoprolol tartrate (LOPRESSOR) 25 MG tablet   Oral   Take 25 mg by mouth 2 (two) times daily.         . montelukast (SINGULAIR) 10 MG tablet   Oral   Take 10 mg by mouth at bedtime.          . Multiple Vitamin (MULTIVITAMIN WITH MINERALS) TABS tablet   Oral   Take 1 tablet by mouth daily.         . ondansetron (ZOFRAN) 4 MG tablet   Oral   Take 4 mg by mouth every 8 (eight) hours as needed for nausea or vomiting.          . pantoprazole (PROTONIX) 40 MG tablet   Oral   Take 40 mg by mouth daily.         Marland Kitchen senna-docusate (SENOKOT-S) 8.6-50 MG tablet   Oral   Take 1 tablet by mouth 2 (two) times daily.         . sertraline (ZOLOFT) 100 MG tablet   Oral   Take 100 mg by mouth 2 (two) times daily.          Marland Kitchen spironolactone (ALDACTONE) 25 MG tablet   Oral   Take 2 tablets (50 mg total) by mouth daily.   30 tablet   5   . lisinopril (ZESTRIL) 10 MG tablet   Oral   Take 1 tablet (10 mg total) by mouth daily.   30 tablet   0     Allergies Fish allergy; Iodine; Latex; Percocet; Shellfish allergy; Amitriptyline; Tape; Wellbutrin; Augmentin; Codeine; Cortisone; Diphenhydramine; Other; and Vicodin  Family History  Problem Relation Age of Onset  . Cancer Father     pancreatic  . Cancer  Brother   . Breast cancer Maternal Aunt     Social History Social History  Substance Use Topics  . Smoking status: Current Every Day Smoker -- 0.50 packs/day for 48 years    Types: Cigarettes  . Smokeless tobacco: Never Used  . Alcohol Use: No    Review of Systems Constitutional: No fever/chills Eyes: No visual changes. ENT: No sore throat. Cardiovascular: Denies chest pain. Respiratory: Denies shortness of breath. Gastrointestinal: No abdominal pain.  No diarrhea.  No constipation. Genitourinary: Unknown per husband Musculoskeletal: Negative for back pain. Skin: Negative for rash except she did "tear the skin" of her right forearm when she fell as well as has redness over both shins which he states similar to "cellulitis". Neurological: Negative for headaches, focal weakness or numbness.  10-point ROS otherwise negative.  ____________________________________________   PHYSICAL EXAM:  VITAL SIGNS: ED Triage Vitals  Enc Vitals Group     BP  04/15/15 1407 102/62 mmHg     Pulse Rate 04/15/15 1407 73     Resp 04/15/15 1407 20     Temp 04/15/15 1407 97.8 F (36.6 C)     Temp Source 04/15/15 1407 Oral     SpO2 04/15/15 1407 97 %     Weight 04/15/15 1407 180 lb (81.647 kg)     Height 04/15/15 1407 5\' 1"  (1.549 m)     Head Cir --      Peak Flow --      Pain Score 04/15/15 1420 4     Pain Loc --      Pain Edu? --      Excl. in [place]? --    Constitutional: Alert and oriented to self, slightly confused to date. Generally ill appearing and in no acute distress. Eyes: Conjunctivae are normal. PERRL. EOMI. Head: Atraumatic. Nose: No congestion/rhinnorhea. Mouth/Throat: Mucous membranes are dry.  Oropharynx non-erythematous. Neck: No stridor.   Cardiovascular: Normal rate, regular rhythm. Grossly normal heart sounds.  Good peripheral circulation. Respiratory: Normal respiratory effort.  No retractions. Lungs CTAB. Gastrointestinal: Soft and nontender. Minimal distention  questionable firm minimal ascites. No abdominal bruits. No CVA tenderness. Musculoskeletal: No lower extremity tenderness nor edema.  There is anterior erythema over the shins bilaterally, the skin is slightly warm shiny and blanching. Neurologic:  Normal speech and language. Generalized weakness. No pronator drift, no obvious focal deficits. No facial droop.  Skin:  Skin is warm, dry and intact except for 2 superficial skin tears which are now bandage over the right forearm without deep laceration or involvement of the deeper dermis. No rash noted except as noted over the extremities lower bilateral. Psychiatric: Mood and affect are calm ____________________________________________   LABS (all labs ordered are listed, but only abnormal results are displayed)  Labs Reviewed  BASIC METABOLIC PANEL - Abnormal; Notable for the following:    Sodium 126 (*)    Chloride 87 (*)    Glucose, Bld 203 (*)    BUN 51 (*)    Creatinine, Ser 1.53 (*)    GFR calc non Af Amer 36 (*)    GFR calc Af Amer 41 (*)    All other components within normal limits  CBC - Abnormal; Notable for the following:    RBC 3.59 (*)    Hemoglobin 11.2 (*)    HCT 33.5 (*)    RDW 15.6 (*)    Platelets 120 (*)    All other components within normal limits  URINALYSIS COMPLETEWITH MICROSCOPIC ([facility] ONLY) - Abnormal; Notable for the following:    Color, Urine YELLOW (*)    APPearance CLEAR (*)    Squamous Epithelial / LPF 0-5 (*)    All other components within normal limits  CBC - Abnormal; Notable for the following:    RBC 3.55 (*)    Hemoglobin 11.4 (*)    HCT 33.7 (*)    RDW 15.3 (*)    Platelets 115 (*)    All other components within normal limits  BASIC METABOLIC PANEL - Abnormal; Notable for the following:    Sodium 129 (*)    Chloride 92 (*)    Glucose, Bld 164 (*)    BUN 49 (*)    Creatinine, Ser 1.31 (*)    Calcium 8.8 (*)    GFR calc non Af Amer 43 (*)    GFR calc Af Amer 50 (*)    All other  components within normal limits  BASIC  METABOLIC PANEL - Abnormal; Notable for the following:    Sodium 130 (*)    Chloride 96 (*)    Glucose, Bld 149 (*)    BUN 41 (*)    Creatinine, Ser 1.09 (*)    Calcium 8.4 (*)    GFR calc non Af Amer 54 (*)    All other components within normal limits  HEMOGLOBIN A1C - Abnormal; Notable for the following:    Hgb A1c MFr Bld 6.1 (*)    All other components within normal limits  CULTURE, BLOOD (ROUTINE X 2)  CULTURE, BLOOD (ROUTINE X 2)  AMMONIA  HEPATIC FUNCTION PANEL  LIPASE, BLOOD  PROTIME-INR  CBG MONITORING, ED   ____________________________________________  EKG  His reviewed and interpreted by me at 1415 Ventricular rate 70 QRS 90 PR 150 QTc 440 No evidence of acute ST elevation MI. Probable left ventricular hypertrophy and T waves normal.  ____________________________________________  RADIOLOGY  CT Head Wo Contrast (Final result) Result time: 04/15/15 14:47:41   Final result by Rad Results In Interface (04/15/15 14:47:41)   Narrative:   CLINICAL DATA: Fall last night, hematoma to left frontal area, hx of cirrhosis  EXAM: CT HEAD WITHOUT CONTRAST  TECHNIQUE: Contiguous axial images were obtained from the base of the skull through the vertex without intravenous contrast.  COMPARISON: None.  FINDINGS: Brain: No evidence of acute infarction, hemorrhage, extra-axial collection, ventriculomegaly, or mass effect.  Vascular: No hyperdense vessel or unexpected calcification.  Skull: Negative for fracture or focal lesion.  Sinuses/Orbits: No acute findings.  Other: Left frontal scalp hematoma.  IMPRESSION: 1. Negative for bleed or other acute intracranial process. 2. Left frontal scalp hematoma   Electronically Signed By: Lucrezia Europe M.D. On: 04/15/2015 14:47    ____________________________________________   PROCEDURES  Procedure(s) performed: None  Critical Care performed:  No  ____________________________________________   INITIAL IMPRESSION / ASSESSMENT AND PLAN / ED COURSE  Pertinent labs & imaging results that were available during my care of the patient were reviewed by me and considered in my medical decision making (see chart for details).  Patient resents for weakness, mild confusion, falls. Minimal contusion over the left frontal scalp. No cervical tenderness. CT the head negative for bleed. Labs reviewed the place as mild thrombocytopenia. Creatinine is elevated GFR is reduced. She is notably hyponatremia which appears to be somewhat chronic in nature. I will hydrate her gently with 500 mL normal saline as she does appear slightly dehydrated. In addition the patient does have apparent bilateral lower extremity anterior cellulitis over the shins. Have initiated antibiotics. Suspect confusion likely due to his slight dehydration, infection, and sending ammonia. Consider hepatic encephalopathy or other etiology like mile dehydration or secondary to hyponatremia.  ____________________________________________   FINAL CLINICAL IMPRESSION(S) / ED DIAGNOSES  Final diagnoses:  Altered mental status, unspecified altered mental status type  Bilateral cellulitis of lower leg  Encephalopathy acute  Skin tear of right forearm without complication, initial encounter  Chronic hyponatremia  Acute kidney injury (HCC)      Delman Kitten, MD 04/19/15 2209

## 2015-04-16 ENCOUNTER — Encounter: Payer: Self-pay | Admitting: Internal Medicine

## 2015-04-16 DIAGNOSIS — Z803 Family history of malignant neoplasm of breast: Secondary | ICD-10-CM | POA: Diagnosis not present

## 2015-04-16 DIAGNOSIS — L03116 Cellulitis of left lower limb: Secondary | ICD-10-CM | POA: Diagnosis not present

## 2015-04-16 DIAGNOSIS — M1990 Unspecified osteoarthritis, unspecified site: Secondary | ICD-10-CM | POA: Diagnosis not present

## 2015-04-16 DIAGNOSIS — J45909 Unspecified asthma, uncomplicated: Secondary | ICD-10-CM | POA: Diagnosis not present

## 2015-04-16 DIAGNOSIS — Z9981 Dependence on supplemental oxygen: Secondary | ICD-10-CM | POA: Diagnosis not present

## 2015-04-16 DIAGNOSIS — Z9841 Cataract extraction status, right eye: Secondary | ICD-10-CM | POA: Diagnosis not present

## 2015-04-16 DIAGNOSIS — Z79899 Other long term (current) drug therapy: Secondary | ICD-10-CM | POA: Diagnosis not present

## 2015-04-16 DIAGNOSIS — W19XXXA Unspecified fall, initial encounter: Secondary | ICD-10-CM | POA: Diagnosis not present

## 2015-04-16 DIAGNOSIS — Z86010 Personal history of colonic polyps: Secondary | ICD-10-CM | POA: Diagnosis not present

## 2015-04-16 DIAGNOSIS — L03115 Cellulitis of right lower limb: Secondary | ICD-10-CM | POA: Diagnosis not present

## 2015-04-16 DIAGNOSIS — I5032 Chronic diastolic (congestive) heart failure: Secondary | ICD-10-CM | POA: Diagnosis not present

## 2015-04-16 DIAGNOSIS — Z888 Allergy status to other drugs, medicaments and biological substances status: Secondary | ICD-10-CM | POA: Diagnosis not present

## 2015-04-16 DIAGNOSIS — R011 Cardiac murmur, unspecified: Secondary | ICD-10-CM | POA: Diagnosis not present

## 2015-04-16 DIAGNOSIS — F4310 Post-traumatic stress disorder, unspecified: Secondary | ICD-10-CM | POA: Diagnosis not present

## 2015-04-16 DIAGNOSIS — E860 Dehydration: Secondary | ICD-10-CM | POA: Diagnosis not present

## 2015-04-16 DIAGNOSIS — J9610 Chronic respiratory failure, unspecified whether with hypoxia or hypercapnia: Secondary | ICD-10-CM | POA: Diagnosis not present

## 2015-04-16 DIAGNOSIS — F17210 Nicotine dependence, cigarettes, uncomplicated: Secondary | ICD-10-CM | POA: Diagnosis not present

## 2015-04-16 DIAGNOSIS — J449 Chronic obstructive pulmonary disease, unspecified: Secondary | ICD-10-CM | POA: Diagnosis not present

## 2015-04-16 DIAGNOSIS — I10 Essential (primary) hypertension: Secondary | ICD-10-CM | POA: Diagnosis not present

## 2015-04-16 DIAGNOSIS — Z9889 Other specified postprocedural states: Secondary | ICD-10-CM | POA: Diagnosis not present

## 2015-04-16 DIAGNOSIS — K7460 Unspecified cirrhosis of liver: Secondary | ICD-10-CM | POA: Diagnosis not present

## 2015-04-16 DIAGNOSIS — S51811A Laceration without foreign body of right forearm, initial encounter: Secondary | ICD-10-CM | POA: Diagnosis not present

## 2015-04-16 DIAGNOSIS — G9340 Encephalopathy, unspecified: Secondary | ICD-10-CM | POA: Diagnosis not present

## 2015-04-16 DIAGNOSIS — E871 Hypo-osmolality and hyponatremia: Secondary | ICD-10-CM | POA: Diagnosis present

## 2015-04-16 DIAGNOSIS — Z23 Encounter for immunization: Secondary | ICD-10-CM | POA: Diagnosis not present

## 2015-04-16 DIAGNOSIS — K219 Gastro-esophageal reflux disease without esophagitis: Secondary | ICD-10-CM | POA: Diagnosis not present

## 2015-04-16 DIAGNOSIS — Z886 Allergy status to analgesic agent status: Secondary | ICD-10-CM | POA: Diagnosis not present

## 2015-04-16 DIAGNOSIS — G8929 Other chronic pain: Secondary | ICD-10-CM | POA: Diagnosis not present

## 2015-04-16 DIAGNOSIS — Z885 Allergy status to narcotic agent status: Secondary | ICD-10-CM | POA: Diagnosis not present

## 2015-04-16 DIAGNOSIS — Z7951 Long term (current) use of inhaled steroids: Secondary | ICD-10-CM | POA: Diagnosis not present

## 2015-04-16 DIAGNOSIS — Z91040 Latex allergy status: Secondary | ICD-10-CM | POA: Diagnosis not present

## 2015-04-16 DIAGNOSIS — E039 Hypothyroidism, unspecified: Secondary | ICD-10-CM | POA: Diagnosis not present

## 2015-04-16 DIAGNOSIS — K589 Irritable bowel syndrome without diarrhea: Secondary | ICD-10-CM | POA: Diagnosis not present

## 2015-04-16 DIAGNOSIS — R269 Unspecified abnormalities of gait and mobility: Secondary | ICD-10-CM | POA: Diagnosis not present

## 2015-04-16 DIAGNOSIS — Z961 Presence of intraocular lens: Secondary | ICD-10-CM | POA: Diagnosis not present

## 2015-04-16 DIAGNOSIS — Z9851 Tubal ligation status: Secondary | ICD-10-CM | POA: Diagnosis not present

## 2015-04-16 DIAGNOSIS — Z9842 Cataract extraction status, left eye: Secondary | ICD-10-CM | POA: Diagnosis not present

## 2015-04-16 DIAGNOSIS — E119 Type 2 diabetes mellitus without complications: Secondary | ICD-10-CM | POA: Diagnosis not present

## 2015-04-16 DIAGNOSIS — R296 Repeated falls: Secondary | ICD-10-CM | POA: Diagnosis not present

## 2015-04-16 DIAGNOSIS — N170 Acute kidney failure with tubular necrosis: Secondary | ICD-10-CM | POA: Diagnosis not present

## 2015-04-16 DIAGNOSIS — E7800 Pure hypercholesterolemia, unspecified: Secondary | ICD-10-CM | POA: Diagnosis not present

## 2015-04-16 LAB — BASIC METABOLIC PANEL
ANION GAP: 6 (ref 5–15)
BUN: 49 mg/dL — AB (ref 6–20)
CHLORIDE: 92 mmol/L — AB (ref 101–111)
CO2: 31 mmol/L (ref 22–32)
Calcium: 8.8 mg/dL — ABNORMAL LOW (ref 8.9–10.3)
Creatinine, Ser: 1.31 mg/dL — ABNORMAL HIGH (ref 0.44–1.00)
GFR calc Af Amer: 50 mL/min — ABNORMAL LOW (ref >60)
GFR, EST NON AFRICAN AMERICAN: 43 mL/min — AB (ref >60)
GLUCOSE: 164 mg/dL — AB (ref 65–99)
POTASSIUM: 4.8 mmol/L (ref 3.5–5.1)
Sodium: 129 mmol/L — ABNORMAL LOW (ref 135–145)

## 2015-04-16 LAB — CBC
HEMATOCRIT: 33.7 % — AB (ref 35.0–47.0)
HEMOGLOBIN: 11.4 g/dL — AB (ref 12.0–16.0)
MCH: 32.1 pg (ref 26.0–34.0)
MCHC: 33.8 g/dL (ref 32.0–36.0)
MCV: 94.9 fL (ref 80.0–100.0)
Platelets: 115 10*3/uL — ABNORMAL LOW (ref 150–440)
RBC: 3.55 MIL/uL — AB (ref 3.80–5.20)
RDW: 15.3 % — ABNORMAL HIGH (ref 11.5–14.5)
WBC: 8 10*3/uL (ref 3.6–11.0)

## 2015-04-16 MED ORDER — INFLUENZA VAC SPLIT QUAD 0.5 ML IM SUSY
0.5000 mL | PREFILLED_SYRINGE | INTRAMUSCULAR | Status: AC
  Administered 2015-04-17: 0.5 mL via INTRAMUSCULAR
  Filled 2015-04-16: qty 0.5

## 2015-04-16 MED ORDER — SENNOSIDES-DOCUSATE SODIUM 8.6-50 MG PO TABS
1.0000 | ORAL_TABLET | Freq: Two times a day (BID) | ORAL | Status: DC
  Administered 2015-04-16 – 2015-04-17 (×3): 1 via ORAL
  Filled 2015-04-16 (×3): qty 1

## 2015-04-16 MED ORDER — ALBUTEROL SULFATE (2.5 MG/3ML) 0.083% IN NEBU: 2.5000 mg | INHALATION_SOLUTION | Freq: Four times a day (QID) | RESPIRATORY_TRACT | Status: DC | PRN

## 2015-04-16 MED ORDER — FE FUMARATE-B12-VIT C-FA-IFC PO CAPS
1.0000 | ORAL_CAPSULE | Freq: Two times a day (BID) | ORAL | Status: DC
  Administered 2015-04-16 – 2015-04-17 (×3): 1 via ORAL
  Filled 2015-04-16 (×8): qty 1

## 2015-04-16 MED ORDER — LEVOTHYROXINE SODIUM 88 MCG PO TABS
88.0000 ug | ORAL_TABLET | Freq: Every day | ORAL | Status: DC
  Administered 2015-04-16 – 2015-04-17 (×2): 88 ug via ORAL
  Filled 2015-04-16 (×2): qty 1

## 2015-04-16 MED ORDER — ONDANSETRON HCL 4 MG PO TABS: 4.0000 mg | ORAL_TABLET | Freq: Three times a day (TID) | ORAL | Status: DC | PRN

## 2015-04-16 MED ORDER — ADULT MULTIVITAMIN W/MINERALS CH
1.0000 | ORAL_TABLET | Freq: Every day | ORAL | Status: DC
  Administered 2015-04-16 – 2015-04-17 (×2): 1 via ORAL
  Filled 2015-04-16 (×2): qty 1

## 2015-04-16 MED ORDER — PNEUMOCOCCAL VAC POLYVALENT 25 MCG/0.5ML IJ INJ
0.5000 mL | INJECTION | INTRAMUSCULAR | Status: AC
  Administered 2015-04-17: 09:00:00 0.5 mL via INTRAMUSCULAR
  Filled 2015-04-16: qty 0.5

## 2015-04-16 MED ORDER — ATORVASTATIN CALCIUM 10 MG PO TABS
10.0000 mg | ORAL_TABLET | Freq: Every day | ORAL | Status: DC
  Administered 2015-04-16: 10 mg via ORAL
  Filled 2015-04-16: qty 1

## 2015-04-16 MED ORDER — ARIPIPRAZOLE 5 MG PO TABS
5.0000 mg | ORAL_TABLET | Freq: Every day | ORAL | Status: DC
  Administered 2015-04-16: 21:00:00 5 mg via ORAL
  Filled 2015-04-16: qty 1

## 2015-04-16 MED ORDER — ENOXAPARIN SODIUM 40 MG/0.4ML ~~LOC~~ SOLN
40.0000 mg | SUBCUTANEOUS | Status: DC
  Administered 2015-04-16: 40 mg via SUBCUTANEOUS
  Filled 2015-04-16: qty 0.4

## 2015-04-16 MED ORDER — MAGNESIUM OXIDE 400 (241.3 MG) MG PO TABS
400.0000 mg | ORAL_TABLET | Freq: Two times a day (BID) | ORAL | Status: DC
  Administered 2015-04-16 – 2015-04-17 (×3): 400 mg via ORAL
  Filled 2015-04-16 (×3): qty 1

## 2015-04-16 MED ORDER — FERROUS FUM-IRON POLYSACCH 162-115.2 MG PO CAPS: 1.0000 | ORAL_CAPSULE | Freq: Two times a day (BID) | ORAL | Status: DC

## 2015-04-16 MED ORDER — GUAIFENESIN ER 600 MG PO TB12: 600.0000 mg | ORAL_TABLET | Freq: Two times a day (BID) | ORAL | Status: DC | PRN

## 2015-04-16 MED ORDER — ALBUTEROL SULFATE (2.5 MG/3ML) 0.083% IN NEBU: 3.0000 mL | INHALATION_SOLUTION | RESPIRATORY_TRACT | Status: DC | PRN

## 2015-04-16 MED ORDER — METOPROLOL TARTRATE 25 MG PO TABS
25.0000 mg | ORAL_TABLET | Freq: Two times a day (BID) | ORAL | Status: DC
  Administered 2015-04-16 – 2015-04-17 (×3): 25 mg via ORAL
  Filled 2015-04-16 (×3): qty 1

## 2015-04-16 MED ORDER — SODIUM CHLORIDE 0.9 % IV SOLN: INTRAVENOUS | Status: DC

## 2015-04-16 MED ORDER — METOCLOPRAMIDE HCL 10 MG PO TABS: 10.0000 mg | ORAL_TABLET | Freq: Four times a day (QID) | ORAL | Status: DC | PRN

## 2015-04-16 MED ORDER — SERTRALINE HCL 50 MG PO TABS
100.0000 mg | ORAL_TABLET | Freq: Two times a day (BID) | ORAL | Status: DC
  Administered 2015-04-16 – 2015-04-17 (×3): 100 mg via ORAL
  Filled 2015-04-16 (×3): qty 2

## 2015-04-16 MED ORDER — BUDESONIDE-FORMOTEROL FUMARATE 80-4.5 MCG/ACT IN AERO
2.0000 | INHALATION_SPRAY | Freq: Two times a day (BID) | RESPIRATORY_TRACT | Status: DC
  Administered 2015-04-16 – 2015-04-17 (×3): 2 via RESPIRATORY_TRACT
  Filled 2015-04-16: qty 6.9

## 2015-04-16 MED ORDER — GABAPENTIN 300 MG PO CAPS
300.0000 mg | ORAL_CAPSULE | Freq: Every day | ORAL | Status: DC
  Administered 2015-04-16: 21:00:00 300 mg via ORAL
  Filled 2015-04-16: qty 1

## 2015-04-16 MED ORDER — LISINOPRIL 20 MG PO TABS: 20.0000 mg | ORAL_TABLET | Freq: Every day | ORAL | Status: DC

## 2015-04-16 MED ORDER — MONTELUKAST SODIUM 10 MG PO TABS
10.0000 mg | ORAL_TABLET | Freq: Every day | ORAL | Status: DC
  Administered 2015-04-16: 21:00:00 10 mg via ORAL
  Filled 2015-04-16: qty 1

## 2015-04-16 MED ORDER — HYDROMORPHONE HCL 2 MG PO TABS: 2.0000 mg | ORAL_TABLET | Freq: Four times a day (QID) | ORAL | Status: DC | PRN

## 2015-04-16 MED ORDER — ALLOPURINOL 100 MG PO TABS
100.0000 mg | ORAL_TABLET | Freq: Two times a day (BID) | ORAL | Status: DC
  Administered 2015-04-16 – 2015-04-17 (×3): 100 mg via ORAL
  Filled 2015-04-16 (×3): qty 1

## 2015-04-16 MED ORDER — LACTULOSE 10 GM/15ML PO SOLN: 20.0000 g | Freq: Two times a day (BID) | ORAL | Status: DC | PRN

## 2015-04-16 MED ORDER — PANTOPRAZOLE SODIUM 40 MG PO TBEC
40.0000 mg | DELAYED_RELEASE_TABLET | Freq: Every day | ORAL | Status: DC
  Administered 2015-04-16 – 2015-04-17 (×2): 40 mg via ORAL
  Filled 2015-04-16 (×2): qty 1

## 2015-04-16 MED ORDER — LAMOTRIGINE 100 MG PO TABS
150.0000 mg | ORAL_TABLET | Freq: Every day | ORAL | Status: DC
  Administered 2015-04-16: 150 mg via ORAL
  Filled 2015-04-16 (×2): qty 1.5

## 2015-04-16 NOTE — ED Notes (Signed)
Pt in bed with eyes closed, pt easily awakened. No needs identified at this time

## 2015-04-16 NOTE — Care Management (Signed)
Admitted to this facility post fall in the home. Lives with ex-husband [name]. Ex-husband helps with activities of daily living. Son is [name] [phone]).  Sees Dr. Rosario Jacks as primary care physician. Followed by [facility] in the home. Home oxygen per [employer].  Fair appetite. Fell prior to admission.  Shelbie Ammons RN MSN CCM Care Management [phone]

## 2015-04-16 NOTE — ED Notes (Signed)
Admitting MD in room, no needs identifed at this time

## 2015-04-16 NOTE — Progress Notes (Signed)
[place] [facility] [place] NAME: Samantha Mcmillan    MR#:  [id number]  DATE OF BIRTH:  [birth year]  SUBJECTIVE: Seen and admitted because of dizziness and fall. She is feeling a little better. No changes in the medication dosages.   CHIEF COMPLAINT:   Chief Complaint  Patient presents with  . Weakness  . Fall    REVIEW OF SYSTEMS:    Review of Systems  Constitutional: Negative for fever and chills.  HENT: Negative for hearing loss.   Eyes: Negative for blurred vision, double vision and photophobia.  Respiratory: Negative for cough, hemoptysis and shortness of breath.   Cardiovascular: Negative for chest pain, palpitations, orthopnea and leg swelling.  Gastrointestinal: Negative for vomiting, abdominal pain and diarrhea.  Genitourinary: Negative for dysuria and urgency.  Musculoskeletal: Negative for myalgias and neck pain.  Skin: Negative for rash.  Neurological: Negative for dizziness, focal weakness, seizures, weakness and headaches.  Psychiatric/Behavioral: Negative for memory loss. The patient does not have insomnia.     Nutrition: Tolerating Diet: Tolerating PT:      DRUG ALLERGIES:   Allergies  Allergen Reactions  . Fish Allergy Anaphylaxis and Swelling  . Iodine Shortness Of Breath  . Latex Anaphylaxis  . Percocet [Oxycodone-Acetaminophen] Shortness Of Breath  . Shellfish Allergy Anaphylaxis and Swelling  . Amitriptyline Other (See Comments)    Reaction:  Mental changes   . Tape Other (See Comments)    Pt states that it pulls her skin off.   . Wellbutrin [Bupropion] Other (See Comments)    Reaction:  Mental changes   . Augmentin [Amoxicillin-Pot Clavulanate] Rash and Other (See Comments)    Has patient had a PCN reaction causing immediate rash, facial/tongue/throat swelling, SOB or lightheadedness with hypotension: No Has patient had a PCN reaction causing severe rash involving mucus membranes or skin necrosis:  No Has patient had a PCN reaction that required hospitalization No Has patient had a PCN reaction occurring within the last 10 years: No If all of the above answers are "NO", then may proceed with Cephalosporin use.  . Codeine Rash  . Cortisone Rash  . Diphenhydramine Rash  . Other Rash and Other (See Comments)    Pt states that she is allergic to Darvocet.   . Vicodin [Hydrocodone-Acetaminophen] Rash and Other (See Comments)    Reaction:  Dizziness      VITALS:  Blood pressure 114/57, pulse 85, temperature 98.7 F (37.1 C), temperature source Oral, resp. rate 18, height 5\' 1"  (1.549 m), weight 81.647 kg (180 lb), SpO2 93 %.  PHYSICAL EXAMINATION:   Physical Exam  GENERAL:  60 y.o.-year-old patient lying in the bed with no acute distress.  EYES: Pupils equal, round, reactive to light and accommodation. No scleral icterus. Extraocular muscles intact.  HEENT: Head atraumatic, normocephalic. Oropharynx and nasopharynx clear.  NECK:  Supple, no jugular venous distention. No thyroid enlargement, no tenderness.  LUNGS: Normal breath sounds bilaterally, no wheezing, rales,rhonchi or crepitation. No use of accessory muscles of respiration.  CARDIOVASCULAR: S1, S2 normal. No murmurs, rubs, or gallops.  ABDOMEN: Soft, nontender, nondistended. Bowel sounds present. No organomegaly or mass.  EXTREMITIES: No pedal edema, cyanosis, or clubbing.  NEUROLOGIC: Cranial nerves II through XII are intact. Muscle strength 5/5 in all extremities. Sensation intact. Gait not checked.  PSYCHIATRIC: The patient is alert and oriented x 3.  SKIN: No obvious rash, lesion, or ulcer.    LABORATORY PANEL:   CBC  Recent Labs  Lab 04/15/15 1425  WBC 6.8  HGB 11.2*  HCT 33.5*  PLT 120*   ------------------------------------------------------------------------------------------------------------------  Chemistries   Recent Labs Lab 04/15/15 1425  NA 126*  K 4.2  CL 87*  CO2 30  GLUCOSE 203*  BUN  51*  CREATININE 1.53*  CALCIUM 8.9  AST 28  ALT 14  ALKPHOS 108  BILITOT 0.6   ------------------------------------------------------------------------------------------------------------------  Cardiac Enzymes No results for input(s): TROPONINI in the last 168 hours. ------------------------------------------------------------------------------------------------------------------  RADIOLOGY:  Ct Head Wo Contrast  04/15/2015  CLINICAL DATA:  Fall last night, hematoma to left frontal area, hx of cirrhosis EXAM: CT HEAD WITHOUT CONTRAST TECHNIQUE: Contiguous axial images were obtained from the base of the skull through the vertex without intravenous contrast. COMPARISON:  None. FINDINGS: Brain: No evidence of acute infarction, hemorrhage, extra-axial collection, ventriculomegaly, or mass effect. Vascular: No hyperdense vessel or unexpected calcification. Skull: Negative for fracture or focal lesion. Sinuses/Orbits: No acute findings. Other: Left frontal scalp hematoma. IMPRESSION: 1. Negative for bleed or other acute intracranial process. 2. Left frontal scalp hematoma Electronically Signed   By: Lucrezia Europe M.D.   On: 04/15/2015 14:47   Dg Chest Port 1 View  04/15/2015  CLINICAL DATA:  Weakness and dizziness since this morning. EXAM: PORTABLE CHEST 1 VIEW COMPARISON:  09/06/2014 FINDINGS: The cardiac silhouette, mediastinal and hilar contours are within normal limits and stable. Low lung volumes with vascular crowding and atelectasis. There is also central vascular congestion without overt pulmonary edema. No pleural effusions. The bony thorax is intact. IMPRESSION: Low lung volumes with vascular crowding atelectasis. Central vascular congestion without overt pulmonary edema. Electronically Signed   By: Marijo Sanes M.D.   On: 04/15/2015 20:52     ASSESSMENT AND PLAN:   Principal Problem:   Fall Active Problems:   Hyponatremia  #1 dizziness and fall secondary to hyponatremia: Continue IV  hydration with normal saline, follow precautions, hold Lasix. #2 acute renal failure due to ATN: Continue IV hydration, hold Lasix, lisinopril.  #3 depression: Continue home medications with Zoloft, Abilify.  #4 chronic pain and PTSD: Patient takes Lamictal.  #6.  GI Prophylaxis, DVT prophylaxis  #5 hypothyroidism 6.copd;stable,no wheezing  All the records are reviewed and case discussed with Care Management/Social Workerr. Management plans discussed with the patient, family and they are in agreement.  CODE STATUS: full  TOTAL TIME TAKING CARE OF THIS PATIENT:35  minutes.   POSSIBLE D/C IN 1-2 DAYS, DEPENDING ON CLINICAL CONDITION.   Epifanio Lesches M.D on 04/16/2015 at 7:59 AM  Between 7am to 6pm - Pager - [phone]  After 6pm go to [email or web address] - password [email or web address] [place]  Office  [phone]  CC: Primary care physician; Casilda Carls, MD

## 2015-04-16 NOTE — Evaluation (Signed)
Physical Therapy Evaluation Patient Details Name: Samantha Mcmillan MRN: [id number] DOB: [birth year] Today's Date: 04/16/2015   History of Present Illness  Pt is admitted for complaints of weakness/falls. Pt has been having increased falls at home. Pt with history of GERD, cirrhosis of liver, COPD, and HTN. Pt currently lives with ex-husband who has been providing 24/7 care.  Clinical Impression  Pt is a pleasant 60 year old female who was admitted for complaints of weakness/falls. Pt performs bed mobility independently and transfers/ambulation with cga and rw. Pt desats quickly on room air with exertion. RN notified. Pt demonstrates deficits with balance/strength/endurance. Would benefit from skilled PT to address above deficits and promote optimal return to PLOF. Recommend transition to [place] upon discharge from acute hospitalization.       Follow Up Recommendations Home health PT    Equipment Recommendations       Recommendations for Other Services       Precautions / Restrictions Precautions Precautions: Fall Restrictions Weight Bearing Restrictions: No      Mobility  Bed Mobility Overal bed mobility: Independent             General bed mobility comments: safe technique with bed mobility  Transfers Overall transfer level: Needs assistance Equipment used: Rolling walker (2 wheeled) Transfers: Sit to/from Stand Sit to Stand: Min guard         General transfer comment: Prior to transfer, O2 sats at 88% while on room air. Once standing with RW, sats increase to 90%. Safe technique performed with correct hand placement  Ambulation/Gait Ambulation/Gait assistance: Min guard Ambulation Distance (Feet): 30 Feet Assistive device: Rolling walker (2 wheeled) Gait Pattern/deviations: Step-through pattern     General Gait Details: ambulated in room with rw, safe technique using reciprocal gait pattern. Pt becomes SOB and requested to sit. O2 sats decreased to 84% on  room air with exertion. Further ambulation deferred as no O2 cannula in room.   Stairs            Wheelchair Mobility    Modified Rankin (Stroke Patients Only)       Balance Overall balance assessment: History of Falls;Needs assistance Sitting-balance support: Feet supported;No upper extremity supported Sitting balance-Leahy Scale: Normal     Standing balance support: Bilateral upper extremity supported Standing balance-Leahy Scale: Fair                               Pertinent Vitals/Pain Pain Assessment: No/denies pain    Home Living Family/patient expects to be discharged to:: Private residence Living Arrangements:  (lives with ex-husband) Available Help at Discharge: Family;Available 24 hours/day Type of Home: House Home Access: Stairs to enter Entrance Stairs-Rails: Can reach both Entrance Stairs-Number of Steps: 3 Home Layout: One level Home Equipment: Walker - 2 wheels;Wheelchair - [employer];Other (comment);Shower seat;Walker - 4 wheels      Prior Function Level of Independence: Independent with assistive device(s)         Comments: walk household distance with rw     Hand Dominance        Extremity/Trunk Assessment   Upper Extremity Assessment: Overall WFL for tasks assessed           Lower Extremity Assessment: Overall WFL for tasks assessed         Communication   Communication: No difficulties  Cognition Arousal/Alertness: Awake/alert Behavior During Therapy: WFL for tasks assessed/performed Overall Cognitive Status: Within Functional Limits for tasks assessed  General Comments      Exercises Other Exercises Other Exercises: Supine/seated ther-ex performed including B LE SLRs, heel slides, LAQ, and B UE shoulder flexion. All ther-ex performed x 10 reps with supervision and cues for correct technique. Pt reports she will continue to do these exercises throughout the day to  maintain strength      Assessment/Plan    PT Assessment Patient needs continued PT services  PT Diagnosis Difficulty walking   PT Problem List Decreased balance;Decreased mobility;Cardiopulmonary status limiting activity  PT Treatment Interventions Gait training;Therapeutic exercise   PT Goals (Current goals can be found in the Care Plan section) Acute Rehab PT Goals Patient Stated Goal: to walk household distance PT Goal Formulation: With patient Time For Goal Achievement: 04/30/15 Potential to Achieve Goals: Good    Frequency Min 2X/week   Barriers to discharge        Co-evaluation               End of Session Equipment Utilized During Treatment: Gait belt Activity Tolerance: Patient tolerated treatment well Patient left: in bed;with bed alarm set Nurse Communication: Mobility status;Precautions    Functional Assessment Tool Used: clinical judgement Functional Limitation: Mobility: Walking and moving around Mobility: Walking and Moving Around Current Status [id number]): At least 1 percent but less than 20 percent impaired, limited or restricted Mobility: Walking and Moving Around Goal Status [phone]): 0 percent impaired, limited or restricted    Time: 0912-0934 PT Time Calculation (min) (ACUTE ONLY): 22 min   Charges:   PT Evaluation $PT Eval Moderate Complexity: 1 Procedure PT Treatments $Therapeutic Exercise: 8-22 mins   PT G Codes:   PT G-Codes **NOT FOR INPATIENT CLASS** Functional Assessment Tool Used: clinical judgement Functional Limitation: Mobility: Walking and moving around Mobility: Walking and Moving Around Current Status [id number]): At least 1 percent but less than 20 percent impaired, limited or restricted Mobility: Walking and Moving Around Goal Status [phone]): 0 percent impaired, limited or restricted    Amilyah Nack 04/16/2015, 11:42 AM  Greggory Stallion, PT, DPT [phone]

## 2015-04-16 NOTE — ED Notes (Signed)
Pt uprite on stretcher in exam room, eyes closed with no distress noted; assisted to repos in bed for comfort; pt with no c/o at present; awaiting admission--pt voices understanding plan of care

## 2015-04-16 NOTE — H&P (Signed)
[facility] [place] NAME: Samantha Mcmillan    MR#:  [id number]  DATE OF BIRTH:  May 17, 1955  DATE OF ADMISSION:  04/15/2015  PRIMARY CARE PHYSICIAN: Casilda Carls, MD   REQUESTING/REFERRING PHYSICIAN:   CHIEF COMPLAINT:   Chief Complaint  Patient presents with  . Weakness  . Fall    HISTORY OF PRESENT ILLNESS: Samantha Mcmillan  is a 60 y.o. female with a known history of GERD, cirrhosis of liver, COPD, hypertension presented to the emergency room with fall. Generalized weakness has been falling frequently. No history of any head injury. Has history of chronic hyponatremia. Today the sodium level is 126. No history of any confusion or any seizure. No complaints of chest pain or shortness of breath. Uses a walker at home but still loses balance and falls. Been feeling dizzy since yesterday. Patient has a caregiver at home who helps her and takes care of her.No history of hempotysis, hematemesis and rectal bleed.  PAST MEDICAL HISTORY:   Past Medical History  Diagnosis Date  . GERD (gastroesophageal reflux disease)   . Colon polyp   . Cirrhosis of liver ([place])   . Hypertension   . Depression   . COPD (chronic obstructive pulmonary disease) ([place])   . IBS (irritable bowel syndrome)   . On home oxygen therapy     "2L; 24/7" (05/04/2014)  . Family history of adverse reaction to anesthesia     "my sister doesn't wake up good when she's put deep to sleep"  . High cholesterol   . Heart murmur   . Asthma   . Pneumonia "several times"  . Chronic bronchitis ([place])     "get it pretty much q yr"   . Sleep apnea     "can't tolerate CPAP" (05/04/2014)  . Type II diabetes mellitus ([place])   . History of blood transfusion     "related to my anemia"  . Anemia   . Iron deficiency anemia   . Headache     "more than a couple times/wk" (05/04/2014)  . Arthritis     "some in my feet" (05/04/2014)  . Chronic lower back pain   . Anxiety   . Acute on  chronic diastolic CHF (congestive heart failure) ([place]) 05/16/2014  . OCD (obsessive compulsive disorder)   . Panic attack   . PTSD (post-traumatic stress disorder)   . Chronic respiratory failure ([place])   . Cirrhosis of liver ([place])   . IDA (iron deficiency anemia) 08/14/2014    PAST SURGICAL HISTORY: Past Surgical History  Procedure Laterality Date  . Cesarean section  1979  . Cataract extraction w/ intraocular lens  implant, bilateral Bilateral 2005  . Back surgery    . Colonoscopy  2014  . Upper gastrointestinal endoscopy    . Posterior fusion cervical spine  2015    "rebuilt 3 of my neck vertebrae"  . Incision and drainage of wound Right 2009    "leg mauled  by dog"  . Tubal ligation  1979  . Dilation and curettage of uterus    . Peripheral vascular catheterization N/A 08/16/2014    Procedure: PICC Line Insertion;  Surgeon: Algernon Huxley, MD;  Location: [facility];  Service: Cardiovascular;  Laterality: N/A;    SOCIAL HISTORY:  Social History  Substance Use Topics  . Smoking status: Current Every Day Smoker -- 0.50 packs/day for 48 years    Types: Cigarettes  . Smokeless tobacco: Never Used  .  Alcohol Use: No    FAMILY HISTORY:  Family History  Problem Relation Age of Onset  . Cancer Father     pancreatic  . Cancer Brother   . Breast cancer Maternal Aunt     DRUG ALLERGIES:  Allergies  Allergen Reactions  . Fish Allergy Anaphylaxis and Swelling  . Iodine Shortness Of Breath  . Latex Anaphylaxis  . Percocet [Oxycodone-Acetaminophen] Shortness Of Breath  . Shellfish Allergy Anaphylaxis and Swelling  . Amitriptyline Other (See Comments)    Reaction:  Mental changes   . Tape Other (See Comments)    Pt states that it pulls her skin off.   . Wellbutrin [Bupropion] Other (See Comments)    Reaction:  Mental changes   . Augmentin [Amoxicillin-Pot Clavulanate] Rash and Other (See Comments)    Has patient had a PCN reaction causing immediate rash,  facial/tongue/throat swelling, SOB or lightheadedness with hypotension: No Has patient had a PCN reaction causing severe rash involving mucus membranes or skin necrosis: No Has patient had a PCN reaction that required hospitalization No Has patient had a PCN reaction occurring within the last 10 years: No If all of the above answers are "NO", then may proceed with Cephalosporin use.  . Codeine Rash  . Cortisone Rash  . Diphenhydramine Rash  . Other Rash and Other (See Comments)    Pt states that she is allergic to Darvocet.   . Vicodin [Hydrocodone-Acetaminophen] Rash and Other (See Comments)    Reaction:  Dizziness      REVIEW OF SYSTEMS:   CONSTITUTIONAL: No fever, has weakness.  EYES: No blurred or double vision.  EARS, NOSE, AND THROAT: No tinnitus or ear pain.  RESPIRATORY: No cough, shortness of breath, wheezing or hemoptysis.  CARDIOVASCULAR: No chest pain, orthopnea, edema.  GASTROINTESTINAL: Has nausea, no vomiting, diarrhea or abdominal pain.  GENITOURINARY: No dysuria, hematuria.  ENDOCRINE: No polyuria, nocturia,  HEMATOLOGY: No anemia, easy bruising or bleeding SKIN: No rash or lesion. MUSCULOSKELETAL: No joint pain or arthritis.   NEUROLOGIC: No tingling, numbness, weakness.  PSYCHIATRY: No anxiety or depression.   MEDICATIONS AT HOME:  Prior to Admission medications   Medication Sig Start Date End Date Taking? Authorizing Provider  albuterol (PROVENTIL HFA;VENTOLIN HFA) 108 (90 BASE) MCG/ACT inhaler Inhale 2 puffs into the lungs every 4 (four) hours as needed for wheezing or shortness of breath.   Yes Historical Provider, MD  albuterol (PROVENTIL) (2.5 MG/3ML) 0.083% nebulizer solution Take 2.5 mg by nebulization every 6 (six) hours as needed for wheezing or shortness of breath.   Yes Historical Provider, MD  allopurinol (ZYLOPRIM) 100 MG tablet Take 100 mg by mouth 2 (two) times daily.    Yes Historical Provider, MD  ARIPiprazole (ABILIFY) 5 MG tablet Take 5 mg by  mouth at bedtime.    Yes Historical Provider, MD  atorvastatin (LIPITOR) 10 MG tablet Take 10 mg by mouth at bedtime.    Yes Historical Provider, MD  budesonide-formoterol (SYMBICORT) 80-4.5 MCG/ACT inhaler Inhale 2 puffs into the lungs 2 (two) times daily.    Yes Historical Provider, MD  bumetanide (BUMEX) 1 MG tablet Take 1 tablet (1 mg total) by mouth daily. 08/29/14  Yes Fritzi Mandes, MD  Buprenorphine (BUTRANS) 15 MCG/HR PTWK Place 15 mcg onto the skin once a week. Pt applies on Sunday.   Yes Historical Provider, MD  cyclobenzaprine (FLEXERIL) 10 MG tablet Take 10 mg by mouth 3 (three) times daily as needed for muscle spasms.    Yes  Historical Provider, MD  ferrous fumarate-iron polysaccharide complex (TANDEM) 162-115.2 MG CAPS Take 1 capsule by mouth 2 (two) times daily after a meal. 10/10/14  Yes Evlyn Kanner, NP  fluticasone (FLONASE) 50 MCG/ACT nasal spray Place 1-2 sprays into both nostrils daily as needed for rhinitis.   Yes Historical Provider, MD  gabapentin (NEURONTIN) 300 MG capsule Take 300 mg by mouth at bedtime.    Yes Historical Provider, MD  guaiFENesin (MUCINEX) 600 MG 12 hr tablet Take 600 mg by mouth 2 (two) times daily as needed for cough or to loosen phlegm.    Yes Historical Provider, MD  HYDROmorphone (DILAUDID) 2 MG tablet Take 2 mg by mouth every 6 (six) hours as needed for severe pain.    Yes Historical Provider, MD  lactulose (CHRONULAC) 10 GM/15ML solution Take 20 g by mouth 2 (two) times daily as needed for mild constipation.   Yes Historical Provider, MD  lamoTRIgine (LAMICTAL) 150 MG tablet Take 150 mg by mouth at bedtime.    Yes Historical Provider, MD  levothyroxine (SYNTHROID, LEVOTHROID) 88 MCG tablet Take 88 mcg by mouth daily before breakfast.    Yes Historical Provider, MD  lisinopril (PRINIVIL,ZESTRIL) 20 MG tablet Take 20 mg by mouth daily.   Yes Historical Provider, MD  magnesium oxide (MAG-OX) 400 MG tablet Take 400 mg by mouth 2 (two) times daily.   Yes  Historical Provider, MD  metoCLOPramide (REGLAN) 10 MG tablet Take 10 mg by mouth 4 (four) times daily as needed for nausea or vomiting.    Yes Historical Provider, MD  metoprolol tartrate (LOPRESSOR) 25 MG tablet Take 25 mg by mouth 2 (two) times daily.   Yes Historical Provider, MD  montelukast (SINGULAIR) 10 MG tablet Take 10 mg by mouth at bedtime.    Yes Historical Provider, MD  Multiple Vitamin (MULTIVITAMIN WITH MINERALS) TABS tablet Take 1 tablet by mouth daily. 05/18/14  Yes Christina P Rama, MD  ondansetron (ZOFRAN) 4 MG tablet Take 4 mg by mouth every 8 (eight) hours as needed for nausea or vomiting.    Yes Historical Provider, MD  pantoprazole (PROTONIX) 40 MG tablet Take 40 mg by mouth daily.   Yes Historical Provider, MD  senna-docusate (SENOKOT-S) 8.6-50 MG tablet Take 1 tablet by mouth 2 (two) times daily.   Yes Historical Provider, MD  sertraline (ZOLOFT) 100 MG tablet Take 100 mg by mouth 2 (two) times daily.    Yes Historical Provider, MD  spironolactone (ALDACTONE) 25 MG tablet Take 2 tablets (50 mg total) by mouth daily. 07/25/14 07/25/15 Yes Darren Allen Norris, MD      PHYSICAL EXAMINATION:   VITAL SIGNS: Blood pressure 107/68, pulse 69, temperature 97.8 F (36.6 C), temperature source Oral, resp. rate 21, height 5\' 1"  (1.549 m), weight 81.647 kg (180 lb), SpO2 95 %.  GENERAL:  60 y.o.-year-old patient lying in the bed with no acute distress.  EYES: Pupils equal, round, reactive to light and accommodation. No scleral icterus. Extraocular muscles intact.  HEENT: Head atraumatic, normocephalic. Oropharynx dry and nasopharynx clear.  NECK:  Supple, no jugular venous distention. No thyroid enlargement, no tenderness.  LUNGS: Normal breath sounds bilaterally, no wheezing, rales,rhonchi or crepitation. No use of accessory muscles of respiration.  CARDIOVASCULAR: S1, S2 normal. No murmurs, rubs, or gallops.  ABDOMEN: Soft, nontender, nondistended. Bowel sounds present. No organomegaly or  mass.  EXTREMITIES: No pedal edema, cyanosis, or clubbing.  NEUROLOGIC: Cranial nerves II through XII are intact. Muscle strength 5/5 in all  extremities. Sensation intact. No cerebellar signs noted. PSYCHIATRIC: The patient is alert and oriented x 3.  SKIN: No obvious rash, lesion, or ulcer.   LABORATORY PANEL:   CBC  Recent Labs Lab 04/15/15 1425  WBC 6.8  HGB 11.2*  HCT 33.5*  PLT 120*  MCV 93.2  MCH 31.1  MCHC 33.4  RDW 15.6*   ------------------------------------------------------------------------------------------------------------------  Chemistries   Recent Labs Lab 04/15/15 1425  NA 126*  K 4.2  CL 87*  CO2 30  GLUCOSE 203*  BUN 51*  CREATININE 1.53*  CALCIUM 8.9  AST 28  ALT 14  ALKPHOS 108  BILITOT 0.6   ------------------------------------------------------------------------------------------------------------------ estimated creatinine clearance is 37.4 mL/min (by C-G formula based on Cr of 1.53). ------------------------------------------------------------------------------------------------------------------ No results for input(s): TSH, T4TOTAL, T3FREE, THYROIDAB in the last 72 hours.  Invalid input(s): FREET3   Coagulation profile  Recent Labs Lab 04/15/15 1425  INR 1.07   ------------------------------------------------------------------------------------------------------------------- No results for input(s): DDIMER in the last 72 hours. -------------------------------------------------------------------------------------------------------------------  Cardiac Enzymes No results for input(s): CKMB, TROPONINI, MYOGLOBIN in the last 168 hours.  Invalid input(s): CK ------------------------------------------------------------------------------------------------------------------ Invalid input(s): POCBNP  ---------------------------------------------------------------------------------------------------------------  Urinalysis     Component Value Date/Time   COLORURINE YELLOW* 04/15/2015 2103   COLORURINE Yellow 03/15/2014 1658   APPEARANCEUR CLEAR* 04/15/2015 2103   APPEARANCEUR Clear 03/15/2014 1658   LABSPEC 1.006 04/15/2015 2103   LABSPEC 1.005 03/15/2014 1658   PHURINE 6.0 04/15/2015 2103   PHURINE 6.0 03/15/2014 1658   GLUCOSEU NEGATIVE 04/15/2015 2103   GLUCOSEU Negative 03/15/2014 1658   HGBUR NEGATIVE 04/15/2015 2103   HGBUR 1+ 03/15/2014 1658   BILIRUBINUR NEGATIVE 04/15/2015 2103   BILIRUBINUR Negative 03/15/2014 1658   KETONESUR NEGATIVE 04/15/2015 2103   KETONESUR Negative 03/15/2014 1658   PROTEINUR NEGATIVE 04/15/2015 2103   PROTEINUR 30 mg/dL 03/15/2014 1658   UROBILINOGEN 0.2 05/15/2014 1608   NITRITE NEGATIVE 04/15/2015 2103   NITRITE Negative 03/15/2014 1658   LEUKOCYTESUR NEGATIVE 04/15/2015 2103   LEUKOCYTESUR 2+ 03/15/2014 1658     RADIOLOGY: Ct Head Wo Contrast  04/15/2015  CLINICAL DATA:  Fall last night, hematoma to left frontal area, hx of cirrhosis EXAM: CT HEAD WITHOUT CONTRAST TECHNIQUE: Contiguous axial images were obtained from the base of the skull through the vertex without intravenous contrast. COMPARISON:  None. FINDINGS: Brain: No evidence of acute infarction, hemorrhage, extra-axial collection, ventriculomegaly, or mass effect. Vascular: No hyperdense vessel or unexpected calcification. Skull: Negative for fracture or focal lesion. Sinuses/Orbits: No acute findings. Other: Left frontal scalp hematoma. IMPRESSION: 1. Negative for bleed or other acute intracranial process. 2. Left frontal scalp hematoma Electronically Signed   By: Lucrezia Europe M.D.   On: 04/15/2015 14:47   Dg Chest Port 1 View  04/15/2015  CLINICAL DATA:  Weakness and dizziness since this morning. EXAM: PORTABLE CHEST 1 VIEW COMPARISON:  09/06/2014 FINDINGS: The cardiac silhouette, mediastinal and hilar contours are within normal limits and stable. Low lung volumes with vascular crowding and atelectasis. There  is also central vascular congestion without overt pulmonary edema. No pleural effusions. The bony thorax is intact. IMPRESSION: Low lung volumes with vascular crowding atelectasis. Central vascular congestion without overt pulmonary edema. Electronically Signed   By: Marijo Sanes M.D.   On: 04/15/2015 20:52    EKG: Orders placed or performed during the hospital encounter of 04/15/15  . EKG 12-Lead  . EKG 12-Lead  . ED EKG  . ED EKG    IMPRESSION AND PLAN: 60 year old female patient with history  of cirrhosis of liver, hypertension, GERD, COPD, hyperlipidemia presented to the emergency room with generalized weakness and frequent falls. Admitting diagnosis 1. Gait instability 2. Accidental fall 3. Hyponatremia 4. Cirrhosis of liver 5. Hypertension Treatment Plan : 1.IV fluids 2.Follow up sodium level 3.PT evaluation 4.Hold diuretics 5.Supportive care.   All the records are reviewed and case discussed with ED provider. Management plans discussed with the patient, family and they are in agreement.  CODE STATUS:FALL Code Status History    Date Active Date Inactive Code Status Order ID Comments User Context   09/06/2014  4:01 PM 09/08/2014  2:39 PM Full Code [id number]  Demetrios Loll, MD Inpatient   08/26/2014 10:01 PM 08/29/2014  3:33 PM Full Code [id number]  Lytle Butte, MD ED   07/07/2014  1:38 AM 07/09/2014  3:57 PM Full Code [id number]  Juluis Mire, MD Inpatient   05/15/2014 10:45 PM 05/18/2014  6:07 PM Full Code [id number]  Allyne Gee, MD Inpatient   05/04/2014  4:03 PM 05/09/2014  4:55 PM Full Code [id number]  Rigoberto Noel, MD ED    Advance Directive Documentation        Most Recent Value   Type of Advance Directive  Healthcare Power of [place], Living will   Pre-existing out of facility DNR order (yellow form or pink MOST form)     "MOST" Form in Place?         TOTAL TIME TAKING CARE OF THIS PATIENT: 50 minutes.    Saundra Shelling M.D on 04/16/2015 at 1:50 AM  Between 7am  to 6pm - Pager - [phone]  After 6pm go to [email or web address] - password [email or web address] [facility]  Office  [phone]  CC: Primary care physician; Casilda Carls, MD

## 2015-04-16 NOTE — Progress Notes (Signed)
Assisted to bathroom using walker. tol well. Dosing throughout the day but alert and responds  Appropriately when spoken to.  Spouse at bedside.

## 2015-04-16 NOTE — ED Notes (Signed)
Pt in bed with eyes closed, no needs identified at this time

## 2015-04-17 DIAGNOSIS — E871 Hypo-osmolality and hyponatremia: Secondary | ICD-10-CM | POA: Diagnosis not present

## 2015-04-17 LAB — BASIC METABOLIC PANEL
Anion gap: 6 (ref 5–15)
BUN: 41 mg/dL — AB (ref 6–20)
CALCIUM: 8.4 mg/dL — AB (ref 8.9–10.3)
CO2: 28 mmol/L (ref 22–32)
CREATININE: 1.09 mg/dL — AB (ref 0.44–1.00)
Chloride: 96 mmol/L — ABNORMAL LOW (ref 101–111)
GFR calc non Af Amer: 54 mL/min — ABNORMAL LOW (ref >60)
Glucose, Bld: 149 mg/dL — ABNORMAL HIGH (ref 65–99)
Potassium: 4.8 mmol/L (ref 3.5–5.1)
Sodium: 130 mmol/L — ABNORMAL LOW (ref 135–145)

## 2015-04-17 LAB — HEMOGLOBIN A1C: Hgb A1c MFr Bld: 6.1 % — ABNORMAL HIGH (ref 4.0–6.0)

## 2015-04-17 MED ORDER — LISINOPRIL 10 MG PO TABS: 10.0000 mg | ORAL_TABLET | Freq: Every day | ORAL | Status: DC

## 2015-04-17 NOTE — Discharge Instructions (Signed)
Acute Kidney Injury °Acute kidney injury is any condition in which there is sudden (acute) damage to the kidneys. Acute kidney injury was previously known as acute kidney failure or acute renal failure. The kidneys are two organs that lie on either side of the spine between the middle of the back and the front of the abdomen. The kidneys: °· Remove wastes and extra water from the blood.   °· Produce important hormones. These help keep bones strong, regulate blood pressure, and help create red blood cells.   °· Balance the fluids and chemicals in the blood and tissues. °A small amount of kidney damage may not cause problems, but a large amount of damage may make it difficult or impossible for the kidneys to work the way they should. Acute kidney injury may develop into long-lasting (chronic) kidney disease. It may also develop into a life-threatening disease called end-stage kidney disease. Acute kidney injury can get worse very quickly, so it should be treated right away. Early treatment may prevent other kidney diseases from developing. °CAUSES  °· A problem with blood flow to the kidneys. This may be caused by:   °¨ Blood loss.   °¨ Heart disease.   °¨ Severe burns.   °¨ Liver disease. °· Direct damage to the kidneys. This may be caused by: °¨ Some medicines.   °¨ A kidney infection.   °¨ Poisoning or consuming toxic substances.   °¨ A surgical wound.   °¨ A blow to the kidney area.   °· A problem with urine flow. This may be caused by:   °¨ Cancer.   °¨ Kidney stones.   °¨ An enlarged prostate. °SIGNS AND SYMPTOMS  °· Swelling (edema) of the legs, ankles, or feet.   °· Tiredness (lethargy).   °· Nausea or vomiting.   °· Confusion.   °· Problems with urination, such as:   °¨ Painful or burning feeling during urination.   °¨ Decreased urine production.   °¨ Frequent accidents in children who are potty trained.   °¨ Bloody urine.   °· Muscle twitches and cramps.   °· Shortness of breath.   °· Seizures.   °· Chest  pain or pressure. °Sometimes, no symptoms are present.  °DIAGNOSIS °Acute kidney injury may be detected and diagnosed by tests, including blood, urine, imaging, or kidney biopsy tests.  °TREATMENT °Treatment of acute kidney injury varies depending on the cause and severity of the kidney damage. In mild cases, no treatment may be needed. The kidneys may heal on their own. If acute kidney injury is more severe, your health care provider will treat the cause of the kidney damage, help the kidneys heal, and prevent complications from occurring. Severe cases may require a procedure to remove toxic wastes from the body (dialysis) or surgery to repair kidney damage. Surgery may involve:  °· Repair of a torn kidney.   °· Removal of an obstruction. °HOME CARE INSTRUCTIONS °· Follow your prescribed diet. °· Take medicines only as directed by your health care provider.  °· Do not take any new medicines (prescription, over-the-counter, or nutritional supplements) unless approved by your health care provider. Many medicines can worsen your kidney damage or may need to have the dose adjusted.   °· Keep all follow-up visits as directed by your health care provider. This is important. °· Observe your condition to make sure you are healing as expected. °SEEK IMMEDIATE MEDICAL CARE IF: °· You are feeling ill or have severe pain in the back or side.   °· Your symptoms return or you have new symptoms. °· You have any symptoms of end-stage kidney disease. These include:   °¨ Persistent itchiness.   °¨   Loss of appetite.   °¨ Headaches.   °¨ Abnormally dark or light skin. °¨ Numbness in the hands or feet.   °¨ Easy bruising.   °¨ Frequent hiccups.   °¨ Menstruation stops.   °· You have a fever. °· You have increased urine production. °· You have pain or bleeding when urinating. °MAKE SURE YOU:  °· Understand these instructions. °· Will watch your condition. °· Will get help right away if you are not doing well or get worse. °  °This  information is not intended to replace advice given to you by your health care provider. Make sure you discuss any questions you have with your health care provider. °  °Document Released: 08/25/2010 Document Revised: 03/02/2014 Document Reviewed: 10/09/2011 °Elsevier Interactive Patient Education ©2016 [employer] ° °

## 2015-04-17 NOTE — Progress Notes (Signed)
Discharge instructions reviewed with patient and son. Patient verbalized understanding. Patient discharged via wheelchair with family.

## 2015-04-17 NOTE — Care Management (Signed)
Discharge to home today per Dr. Vianne Bulls. Face sheet, face-to-face, History & Physical and orders faxed to [facility]. Family will transport.  Shelbie Ammons RN MSN CCM Care Management [phone]

## 2015-04-20 LAB — CULTURE, BLOOD (ROUTINE X 2)
CULTURE: NO GROWTH
Culture: NO GROWTH

## 2015-04-20 NOTE — Discharge Summary (Signed)
Samantha Mcmillan, is a 60 y.o. female  DOB 1956/01/16  MRN [id number].  Admission date:  04/15/2015  Admitting Physician  Saundra Shelling, MD  Discharge Date:  04/17/2015   Primary MD  Casilda Carls, MD  Recommendations for primary care physician for things to follow:  Follow up with PMD in one week   Admission Diagnosis  Chronic hyponatremia [E87.1] Encephalopathy acute [G93.40] Acute kidney injury ([place]) [N17.9] Bilateral cellulitis of lower leg [L03.116, L03.115] Skin tear of right forearm without complication, initial encounter [S51.801A] Altered mental status, unspecified altered mental status type [R41.82]   Discharge Diagnosis  Chronic hyponatremia [E87.1] Encephalopathy acute [G93.40] Acute kidney injury ([place]) [N17.9] Bilateral cellulitis of lower leg [L03.116, L03.115] Skin tear of right forearm without complication, initial encounter [S51.801A] Altered mental status, unspecified altered mental status type [R41.82]    Principal Problem:   Fall Active Problems:   Hyponatremia      Past Medical History  Diagnosis Date  . GERD (gastroesophageal reflux disease)   . Colon polyp   . Cirrhosis of liver ([place])   . Hypertension   . Depression   . COPD (chronic obstructive pulmonary disease) ([place])   . IBS (irritable bowel syndrome)   . On home oxygen therapy     "2L; 24/7" (05/04/2014)  . Family history of adverse reaction to anesthesia     "my sister doesn't wake up good when she's put deep to sleep"  . High cholesterol   . Heart murmur   . Asthma   . Pneumonia "several times"  . Chronic bronchitis ([place])     "get it pretty much q yr"   . Sleep apnea     "can't tolerate CPAP" (05/04/2014)  . Type II diabetes mellitus ([place])   . History of blood transfusion     "related to my anemia"  . Anemia   . Iron  deficiency anemia   . Headache     "more than a couple times/wk" (05/04/2014)  . Arthritis     "some in my feet" (05/04/2014)  . Chronic lower back pain   . Anxiety   . Acute on chronic diastolic CHF (congestive heart failure) ([place]) 05/16/2014  . OCD (obsessive compulsive disorder)   . Panic attack   . PTSD (post-traumatic stress disorder)   . Chronic respiratory failure ([place])   . Cirrhosis of liver ([place])   . IDA (iron deficiency anemia) 08/14/2014    Past Surgical History  Procedure Laterality Date  . Cesarean section  1979  . Cataract extraction w/ intraocular lens  implant, bilateral Bilateral 2005  . Back surgery    . Colonoscopy  2014  . Upper gastrointestinal endoscopy    . Posterior fusion cervical spine  2015    "rebuilt 3 of my neck vertebrae"  . Incision and drainage of wound Right 2009    "leg mauled  by dog"  . Tubal ligation  1979  . Dilation and curettage of uterus    . Peripheral vascular catheterization N/A 08/16/2014    Procedure: PICC Line Insertion;  Surgeon: Algernon Huxley, MD;  Location: [facility];  Service: Cardiovascular;  Laterality: N/A;       History of present illness and  Hospital Course:     Kindly see H&P for history of present illness and admission details, please review complete Labs, Consult reports and Test reports for all details in brief  HPI  from the history and physical done on the day of admission  60 yr old female with cirrhosis of liver,copd admtted for acute on chronic hyponatremia.   Hospital Course   Acute on chronic hyponatremia;sodium 126 on admission .improved to 130 with iv fluids.lisinopril dose is decreased from 20 mg to 10 mg 2.dizziness /fall due to hyponatremia;PT recommends home PT. 3.acute renal failure due to ATN;from dehydration;improved with fluids, 4.chronic pan,PTSD;continue lamictal 5.depression 6.hypothyroidism\   Discharge Condition:stable   Follow UP      Discharge Instructions  and   Discharge Medications     Discharge Instructions    Face-to-face encounter (required for Medicare/Medicaid patients)    Complete by:  As directed   I Esperansa Sarabia certify that this patient is under my care and that I, or a nurse practitioner or physician's assistant working with me, had a face-to-face encounter that meets the physician face-to-face encounter requirements with this patient on 04/17/2015. The encounter with the patient was in whole, or in part for the following medical condition(s) which is the primary reason for home health care Copd htn Hyponatremia Chronic pain Acute renal failure deconditioning  The encounter with the patient was in whole, or in part, for the following medical condition, which is the primary reason for home health care:  whole  I certify that, based on my findings, the following services are medically necessary home health services:  Physical therapy  Reason for Medically Necessary Home Health Services:  Therapy- Personnel officer, Public librarian  My clinical findings support the need for the above services:  Unsafe ambulation due to balance issues  Further, I certify that my clinical findings support that this patient is homebound due to:  Unsafe ambulation due to balance issues     Home Health    Complete by:  As directed   To provide the following care/treatments:  PT            Medication List    STOP taking these medications        bumetanide 1 MG tablet  Commonly known as:  BUMEX      TAKE these medications        albuterol 108 (90 Base) MCG/ACT inhaler  Commonly known as:  PROVENTIL HFA;VENTOLIN HFA  Inhale 2 puffs into the lungs every 4 (four) hours as needed for wheezing or shortness of breath.     albuterol (2.5 MG/3ML) 0.083% nebulizer solution  Commonly known as:  PROVENTIL  Take 2.5 mg by nebulization every 6 (six) hours as needed for wheezing or shortness of breath.     allopurinol 100 MG tablet   Commonly known as:  ZYLOPRIM  Take 100 mg by mouth 2 (two) times daily.     ARIPiprazole 5 MG tablet  Commonly known as:  ABILIFY  Take 5 mg by mouth at bedtime.     atorvastatin 10 MG tablet  Commonly known as:  LIPITOR  Take 10 mg by mouth at bedtime.     budesonide-formoterol 80-4.5 MCG/ACT inhaler  Commonly known as:  SYMBICORT  Inhale 2 puffs into the lungs 2 (two) times daily.     BUTRANS 15 MCG/HR Ptwk  Generic drug:  Buprenorphine  Place 15 mcg onto the skin once a week. Pt applies on Sunday.     cyclobenzaprine 10 MG tablet  Commonly known as:  FLEXERIL  Take 10 mg by mouth 3 (three) times daily as needed for muscle spasms.     ferrous fumarate-iron polysaccharide complex 162-115.2 MG Caps capsule  Commonly known as:  TANDEM  Take 1 capsule by mouth 2 (two) times daily after a meal.     fluticasone 50 MCG/ACT nasal spray  Commonly known as:  FLONASE  Place 1-2 sprays into both nostrils daily as needed for rhinitis.     gabapentin 300 MG capsule  Commonly known as:  NEURONTIN  Take 300 mg by mouth at bedtime.     guaiFENesin 600 MG 12 hr tablet  Commonly known as:  MUCINEX  Take 600 mg by mouth 2 (two) times daily as needed for cough or to loosen phlegm.     HYDROmorphone 2 MG tablet  Commonly known as:  DILAUDID  Take 2 mg by mouth every 6 (six) hours as needed for severe pain.     lactulose 10 GM/15ML solution  Commonly known as:  CHRONULAC  Take 20 g by mouth 2 (two) times daily as needed for mild constipation.     lamoTRIgine 150 MG tablet  Commonly known as:  LAMICTAL  Take 150 mg by mouth at bedtime.     levothyroxine 88 MCG tablet  Commonly known as:  SYNTHROID, LEVOTHROID  Take 88 mcg by mouth daily before breakfast.     lisinopril 10 MG tablet  Commonly known as:  ZESTRIL  Take 1 tablet (10 mg total) by mouth daily.     magnesium oxide 400 MG tablet  Commonly known as:  MAG-OX  Take 400 mg by mouth 2 (two) times daily.      metoCLOPramide 10 MG tablet  Commonly known as:  REGLAN  Take 10 mg by mouth 4 (four) times daily as needed for nausea or vomiting.     metoprolol tartrate 25 MG tablet  Commonly known as:  LOPRESSOR  Take 25 mg by mouth 2 (two) times daily.     montelukast 10 MG tablet  Commonly known as:  SINGULAIR  Take 10 mg by mouth at bedtime.     multivitamin with minerals Tabs tablet  Take 1 tablet by mouth daily.     ondansetron 4 MG tablet  Commonly known as:  ZOFRAN  Take 4 mg by mouth every 8 (eight) hours as needed for nausea or vomiting.     pantoprazole 40 MG tablet  Commonly known as:  PROTONIX  Take 40 mg by mouth daily.     senna-docusate 8.6-50 MG tablet  Commonly known as:  Senokot-S  Take 1 tablet by mouth 2 (two) times daily.     sertraline 100 MG tablet  Commonly known as:  ZOLOFT  Take 100 mg by mouth 2 (two) times daily.     spironolactone 25 MG tablet  Commonly known as:  ALDACTONE  Take 2 tablets (50 mg total) by mouth daily.          Diet and Activity recommendation: See Discharge Instructions above   Consults obtained -none  Major procedures and Radiology Reports - PLEASE review detailed and final reports for all details, in brief -     Ct Head Wo Contrast  04/15/2015  CLINICAL DATA:  Fall last night, hematoma to left frontal area, hx of cirrhosis EXAM: CT HEAD WITHOUT CONTRAST TECHNIQUE: Contiguous axial images were obtained from the base of the skull through the vertex without intravenous contrast. COMPARISON:  None. FINDINGS: Brain: No evidence of acute infarction, hemorrhage, extra-axial collection, ventriculomegaly, or mass effect. Vascular: No hyperdense vessel or unexpected calcification. Skull: Negative for fracture or focal lesion. Sinuses/Orbits: No acute findings. Other: Left frontal scalp hematoma. IMPRESSION: 1. Negative for bleed or  other acute intracranial process. 2. Left frontal scalp hematoma Electronically Signed   By: Lucrezia Europe M.D.    On: 04/15/2015 14:47   Dg Chest Port 1 View  04/15/2015  CLINICAL DATA:  Weakness and dizziness since this morning. EXAM: PORTABLE CHEST 1 VIEW COMPARISON:  09/06/2014 FINDINGS: The cardiac silhouette, mediastinal and hilar contours are within normal limits and stable. Low lung volumes with vascular crowding and atelectasis. There is also central vascular congestion without overt pulmonary edema. No pleural effusions. The bony thorax is intact. IMPRESSION: Low lung volumes with vascular crowding atelectasis. Central vascular congestion without overt pulmonary edema. Electronically Signed   By: Marijo Sanes M.D.   On: 04/15/2015 20:52    Micro Results    Recent Results (from the past 240 hour(s))  Culture, blood (Routine X 2) w Reflex to ID Panel     Status: None   Collection Time: 04/15/15  6:02 PM  Result Value Ref Range Status   Specimen Description BLOOD RIGHT FATTY CASTS  Final   Special Requests BOTTLES DRAWN AEROBIC AND ANAEROBIC 6CC  Final   Culture NO GROWTH 5 DAYS  Final   Report Status 04/20/2015 FINAL  Final  Culture, blood (Routine X 2) w Reflex to ID Panel     Status: None   Collection Time: 04/15/15  6:03 PM  Result Value Ref Range Status   Specimen Description BLOOD RIGHT HAND  Final   Special Requests BOTTLES DRAWN AEROBIC AND ANAEROBIC [facility]  Final   Culture NO GROWTH 5 DAYS  Final   Report Status 04/20/2015 FINAL  Final       Today   Subjective:   Samantha Mcmillan today has no headache,no chest abdominal pain,no new weakness tingling or numbness, feels much better wants to go home today.   Objective:   Blood pressure 132/57, pulse 82, temperature 98.3 F (36.8 C), temperature source Oral, resp. rate 17, height 5\' 1"  (1.549 m), weight 81.647 kg (180 lb), SpO2 99 %.  No intake or output data in the 24 hours ending 04/20/15 2234  Exam Awake Alert, Oriented x 3, No new F.N deficits, Normal affect [place].AT,PERRAL Supple Neck,No JVD, No cervical lymphadenopathy  appriciated.  Symmetrical Chest wall movement, Good air movement bilaterally, CTAB RRR,No Gallops,Rubs or new Murmurs, No Parasternal Heave +ve B.Sounds, Abd Soft, Non tender, No organomegaly appriciated, No rebound -guarding or rigidity. No Cyanosis, Clubbing or edema, No new Rash or bruise  Data Review   CBC w Diff:  Lab Results  Component Value Date   WBC 8.0 04/16/2015   WBC 2.6* 06/15/2014   WBC 3.9 05/31/2014   HGB 11.4* 04/16/2015   HGB 7.7* 06/21/2014   HCT 33.7* 04/16/2015   HCT 22.5* 06/15/2014   PLT 115* 04/16/2015   PLT 138* 06/15/2014   LYMPHOPCT 23 03/28/2015   LYMPHOPCT 18.5 06/15/2014   MONOPCT 7 03/28/2015   MONOPCT 4.2 06/15/2014   EOSPCT 2 03/28/2015   EOSPCT 0.2 06/15/2014   BASOPCT 1 03/28/2015   BASOPCT 1.0 06/15/2014    CMP:  Lab Results  Component Value Date   NA 130* 04/17/2015   NA 128* 05/31/2014   NA 136 03/15/2014   K 4.8 04/17/2015   K 3.1* 03/15/2014   CL 96* 04/17/2015   CL 98 03/15/2014   CO2 28 04/17/2015   CO2 29 03/15/2014   BUN 41* 04/17/2015   BUN 34* 05/31/2014   BUN 8 03/15/2014   CREATININE 1.09* 04/17/2015   CREATININE 1.1 05/31/2014  CREATININE 1.05 03/15/2014   GLU 120 05/31/2014   PROT 6.8 04/15/2015   PROT 5.9* 03/15/2014   PROT 5.7* 03/15/2014   ALBUMIN 3.6 04/15/2015   ALBUMIN 2.1* 03/15/2014   ALBUMIN 2.9* 03/15/2014   BILITOT 0.6 04/15/2015   BILITOT 0.4 03/15/2014   ALKPHOS 108 04/15/2015   ALKPHOS 221* 03/15/2014   AST 28 04/15/2015   AST 40* 03/15/2014   ALT 14 04/15/2015   ALT 13* 03/15/2014  .   Total Time in preparing paper work, data evaluation and todays exam - 59 minutes  Ebonye Reade M.D on 04/17/2015 at 10:34 PM    Note: This dictation was prepared with Dragon dictation along with smaller phrase technology. Any transcriptional errors that result from this process are unintentional.

## 2015-06-04 DIAGNOSIS — I6523 Occlusion and stenosis of bilateral carotid arteries: Secondary | ICD-10-CM | POA: Insufficient documentation

## 2015-06-04 DIAGNOSIS — E782 Mixed hyperlipidemia: Secondary | ICD-10-CM | POA: Insufficient documentation

## 2015-06-17 ENCOUNTER — Telehealth: Payer: Self-pay | Admitting: *Deleted

## 2015-06-17 NOTE — Telephone Encounter (Signed)
Notified patient that her 1 year follow up lung cancer screening low dose CT scan is due. Confirmed that patient is within age range of 55-77, asymptomatic of lung cancer, and no other serious disease processes that would make treatment of lung cancer not possible. The patient is a current smoker or with a 94.5 pack year history. The shared decision making visit was completed 08/14/13. The patient is agreeable for CT scan to be scheduled. Patient is worried about a co-pay. Discussed that we have a fund that will assist with billing. Instructed patient to bring her bill after the CT scan to the cancer center [facility]. She said she sees Dr Rogue Bussing at the Cancer center,

## 2015-06-21 ENCOUNTER — Encounter: Payer: Self-pay | Admitting: Family Medicine

## 2015-06-21 ENCOUNTER — Other Ambulatory Visit: Payer: Self-pay | Admitting: Family Medicine

## 2015-06-21 DIAGNOSIS — Z87891 Personal history of nicotine dependence: Secondary | ICD-10-CM

## 2015-06-21 HISTORY — DX: Personal history of nicotine dependence: Z87.891

## 2015-06-25 ENCOUNTER — Emergency Department
Admission: EM | Admit: 2015-06-25 | Discharge: 2015-06-25 | Disposition: A | Discharge status: Home / Self Care | Payer: Medicare Other | Attending: Emergency Medicine | Admitting: Emergency Medicine

## 2015-06-25 ENCOUNTER — Emergency Department: Payer: Medicare Other

## 2015-06-25 ENCOUNTER — Encounter: Payer: Self-pay | Admitting: Emergency Medicine

## 2015-06-25 DIAGNOSIS — M25561 Pain in right knee: Secondary | ICD-10-CM | POA: Diagnosis present

## 2015-06-25 DIAGNOSIS — I5032 Chronic diastolic (congestive) heart failure: Secondary | ICD-10-CM | POA: Diagnosis not present

## 2015-06-25 DIAGNOSIS — Y999 Unspecified external cause status: Secondary | ICD-10-CM | POA: Insufficient documentation

## 2015-06-25 DIAGNOSIS — E119 Type 2 diabetes mellitus without complications: Secondary | ICD-10-CM | POA: Diagnosis not present

## 2015-06-25 DIAGNOSIS — Z79899 Other long term (current) drug therapy: Secondary | ICD-10-CM | POA: Diagnosis not present

## 2015-06-25 DIAGNOSIS — F17210 Nicotine dependence, cigarettes, uncomplicated: Secondary | ICD-10-CM | POA: Insufficient documentation

## 2015-06-25 DIAGNOSIS — F329 Major depressive disorder, single episode, unspecified: Secondary | ICD-10-CM | POA: Diagnosis not present

## 2015-06-25 DIAGNOSIS — Y92012 Bathroom of single-family (private) house as the place of occurrence of the external cause: Secondary | ICD-10-CM | POA: Insufficient documentation

## 2015-06-25 DIAGNOSIS — S51811A Laceration without foreign body of right forearm, initial encounter: Secondary | ICD-10-CM | POA: Diagnosis not present

## 2015-06-25 DIAGNOSIS — S82202A Unspecified fracture of shaft of left tibia, initial encounter for closed fracture: Secondary | ICD-10-CM

## 2015-06-25 DIAGNOSIS — Y939 Activity, unspecified: Secondary | ICD-10-CM | POA: Diagnosis not present

## 2015-06-25 DIAGNOSIS — J449 Chronic obstructive pulmonary disease, unspecified: Secondary | ICD-10-CM | POA: Insufficient documentation

## 2015-06-25 DIAGNOSIS — Z7951 Long term (current) use of inhaled steroids: Secondary | ICD-10-CM | POA: Diagnosis not present

## 2015-06-25 DIAGNOSIS — E039 Hypothyroidism, unspecified: Secondary | ICD-10-CM | POA: Insufficient documentation

## 2015-06-25 DIAGNOSIS — S51812A Laceration without foreign body of left forearm, initial encounter: Secondary | ICD-10-CM | POA: Diagnosis not present

## 2015-06-25 DIAGNOSIS — J45909 Unspecified asthma, uncomplicated: Secondary | ICD-10-CM | POA: Diagnosis not present

## 2015-06-25 DIAGNOSIS — W010XXA Fall on same level from slipping, tripping and stumbling without subsequent striking against object, initial encounter: Secondary | ICD-10-CM | POA: Diagnosis not present

## 2015-06-25 DIAGNOSIS — I110 Hypertensive heart disease with heart failure: Secondary | ICD-10-CM | POA: Diagnosis not present

## 2015-06-25 MED ORDER — MORPHINE SULFATE 30 MG PO TABS: 30.0000 mg | ORAL_TABLET | ORAL | Status: DC | PRN

## 2015-06-25 MED ORDER — MORPHINE SULFATE 15 MG PO TABS
15.0000 mg | ORAL_TABLET | ORAL | Status: DC | PRN
  Administered 2015-06-25: 15 mg via ORAL
  Filled 2015-06-25: qty 1

## 2015-06-25 NOTE — ED Provider Notes (Signed)
[facility] Emergency Department Provider Note ____________________________________________  Time seen: Approximately 3:54 PM  I have reviewed the triage vital signs and the nursing notes.   HISTORY  Chief Complaint Fall    HPI Samantha Mcmillan is a 60 y.o. female who presents to the emergency department for evaluation of knee pain and skin tears post fall. Patient states she sustained a mechanical, non syncopal fall this afternoon after standing up from the toilet and swayed backward then slid down the wall. She now has pain in the left knee and is no longer able to bear weight. Skin tears are on bilateral forearms are no longer bleeding and have been covered with Band Aids.   Past Medical History  Diagnosis Date  . GERD (gastroesophageal reflux disease)   . Colon polyp   . Cirrhosis of liver ([place])   . Hypertension   . Depression   . COPD (chronic obstructive pulmonary disease) ([place])   . IBS (irritable bowel syndrome)   . On home oxygen therapy     "2L; 24/7" (05/04/2014)  . Family history of adverse reaction to anesthesia     "my sister doesn't wake up good when she's put deep to sleep"  . High cholesterol   . Heart murmur   . Asthma   . Pneumonia "several times"  . Chronic bronchitis ([place])     "get it pretty much q yr"   . Sleep apnea     "can't tolerate CPAP" (05/04/2014)  . Type II diabetes mellitus ([place])   . History of blood transfusion     "related to my anemia"  . Anemia   . Iron deficiency anemia   . Headache     "more than a couple times/wk" (05/04/2014)  . Arthritis     "some in my feet" (05/04/2014)  . Chronic lower back pain   . Anxiety   . Acute on chronic diastolic CHF (congestive heart failure) ([place]) 05/16/2014  . OCD (obsessive compulsive disorder)   . Panic attack   . PTSD (post-traumatic stress disorder)   . Chronic respiratory failure ([place])   . Cirrhosis of liver ([place])   . IDA (iron deficiency anemia) 08/14/2014  . Personal  history of tobacco use, presenting hazards to health 06/21/2015    Patient Active Problem List   Diagnosis Date Noted  . Personal history of tobacco use, presenting hazards to health 06/21/2015  . Fall 04/16/2015  . Bacterial skin infection of leg 10/24/2014  . Edema leg 10/08/2014  . Hypertensive pulmonary vascular disease ([place]) 10/08/2014  . Chronic diastolic heart failure ([place]) 09/21/2014  . Tobacco use 09/21/2014  . Thrombocytopenia ([place]) 09/19/2014  . Leukopenia 09/19/2014  . Pancytopenia ([place]) 09/06/2014  . IDA (iron deficiency anemia) 08/14/2014  . Ascites 07/25/2014  . Cellulitis and abscess of lower extremity 07/07/2014  . Encephalopathy, hepatic ([place]) 05/18/2014  . Polypharmacy 05/18/2014  . Failure to thrive in adult 05/16/2014  . Hepatic cirrhosis ([place]) 05/16/2014  . Protein-calorie malnutrition, severe ([place]) 05/16/2014  . Advanced COPD ([place]) 05/16/2014  . Chronic back pain 05/16/2014  . Weakness 05/15/2014  . Hyponatremia 05/15/2014  . HCAP (healthcare-associated pneumonia) 05/09/2014  . Urinary retention 05/09/2014  . DM2 (diabetes mellitus, type 2) ([place]) 05/09/2014  . Hypothyroidism 05/05/2014    Past Surgical History  Procedure Laterality Date  . Cesarean section  1979  . Cataract extraction w/ intraocular lens  implant, bilateral Bilateral 2005  . Back surgery    . Colonoscopy  2014  .  Upper gastrointestinal endoscopy    . Posterior fusion cervical spine  2015    "rebuilt 3 of my neck vertebrae"  . Incision and drainage of wound Right 2009    "leg mauled  by dog"  . Tubal ligation  1979  . Dilation and curettage of uterus    . Peripheral vascular catheterization N/A 08/16/2014    Procedure: PICC Line Insertion;  Surgeon: Algernon Huxley, MD;  Location: [facility];  Service: Cardiovascular;  Laterality: N/A;    Current Outpatient Rx  Name  Route  Sig  Dispense  Refill  . albuterol (PROVENTIL HFA;VENTOLIN HFA) 108 (90 BASE) MCG/ACT inhaler    Inhalation   Inhale 2 puffs into the lungs every 4 (four) hours as needed for wheezing or shortness of breath.         Marland Kitchen albuterol (PROVENTIL) (2.5 MG/3ML) 0.083% nebulizer solution   Nebulization   Take 2.5 mg by nebulization every 6 (six) hours as needed for wheezing or shortness of breath.         . allopurinol (ZYLOPRIM) 100 MG tablet   Oral   Take 100 mg by mouth 2 (two) times daily.          . ARIPiprazole (ABILIFY) 5 MG tablet   Oral   Take 5 mg by mouth at bedtime.          Marland Kitchen atorvastatin (LIPITOR) 10 MG tablet   Oral   Take 10 mg by mouth at bedtime.          . budesonide-formoterol (SYMBICORT) 80-4.5 MCG/ACT inhaler   Inhalation   Inhale 2 puffs into the lungs 2 (two) times daily.          . Buprenorphine (BUTRANS) 15 MCG/HR PTWK   Transdermal   Place 15 mcg onto the skin once a week. Pt applies on Sunday.         . cyclobenzaprine (FLEXERIL) 10 MG tablet   Oral   Take 10 mg by mouth 3 (three) times daily as needed for muscle spasms.          . ferrous fumarate-iron polysaccharide complex (TANDEM) 162-115.2 MG CAPS   Oral   Take 1 capsule by mouth 2 (two) times daily after a meal.   60 capsule   5   . fluticasone (FLONASE) 50 MCG/ACT nasal spray   Each Nare   Place 1-2 sprays into both nostrils daily as needed for rhinitis.         Marland Kitchen gabapentin (NEURONTIN) 300 MG capsule   Oral   Take 300 mg by mouth at bedtime.          Marland Kitchen guaiFENesin (MUCINEX) 600 MG 12 hr tablet   Oral   Take 600 mg by mouth 2 (two) times daily as needed for cough or to loosen phlegm.          Marland Kitchen HYDROmorphone (DILAUDID) 2 MG tablet   Oral   Take 2 mg by mouth every 6 (six) hours as needed for severe pain.          Marland Kitchen lactulose (CHRONULAC) 10 GM/15ML solution   Oral   Take 20 g by mouth 2 (two) times daily as needed for mild constipation.         Marland Kitchen lamoTRIgine (LAMICTAL) 150 MG tablet   Oral   Take 150 mg by mouth at bedtime.          Marland Kitchen levothyroxine  (SYNTHROID, LEVOTHROID) 88 MCG tablet   Oral  Take 88 mcg by mouth daily before breakfast.          . lisinopril (ZESTRIL) 10 MG tablet   Oral   Take 1 tablet (10 mg total) by mouth daily.   30 tablet   0   . magnesium oxide (MAG-OX) 400 MG tablet   Oral   Take 400 mg by mouth 2 (two) times daily.         . metoCLOPramide (REGLAN) 10 MG tablet   Oral   Take 10 mg by mouth 4 (four) times daily as needed for nausea or vomiting.          . metoprolol tartrate (LOPRESSOR) 25 MG tablet   Oral   Take 25 mg by mouth 2 (two) times daily.         . montelukast (SINGULAIR) 10 MG tablet   Oral   Take 10 mg by mouth at bedtime.          . Multiple Vitamin (MULTIVITAMIN WITH MINERALS) TABS tablet   Oral   Take 1 tablet by mouth daily.         . ondansetron (ZOFRAN) 4 MG tablet   Oral   Take 4 mg by mouth every 8 (eight) hours as needed for nausea or vomiting.          . pantoprazole (PROTONIX) 40 MG tablet   Oral   Take 40 mg by mouth daily.         Marland Kitchen senna-docusate (SENOKOT-S) 8.6-50 MG tablet   Oral   Take 1 tablet by mouth 2 (two) times daily.         . sertraline (ZOLOFT) 100 MG tablet   Oral   Take 100 mg by mouth 2 (two) times daily.          Marland Kitchen spironolactone (ALDACTONE) 25 MG tablet   Oral   Take 2 tablets (50 mg total) by mouth daily.   30 tablet   5     Allergies Fish allergy; Iodine; Latex; Percocet; Shellfish allergy; Amitriptyline; Tape; Wellbutrin; Augmentin; Codeine; Cortisone; Diphenhydramine; Other; and Vicodin  Family History  Problem Relation Age of Onset  . Cancer Father     pancreatic  . Cancer Brother   . Breast cancer Maternal Aunt     Social History Social History  Substance Use Topics  . Smoking status: Current Every Day Smoker -- 0.50 packs/day for 48 years    Types: Cigarettes  . Smokeless tobacco: Never Used  . Alcohol Use: No    Review of Systems Constitutional: Chronically ill. Musculoskeletal: Pain in  left knee. Skin: Positive for skin tears. Neurological: Negative for focal weakness or numbness. ____________________________________________   PHYSICAL EXAM:  VITAL SIGNS: ED Triage Vitals  Enc Vitals Group     BP 06/25/15 1545 153/62 mmHg     Pulse Rate 06/25/15 1545 67     Resp 06/25/15 1545 18     Temp 06/25/15 1545 97.6 F (36.4 C)     Temp Source 06/25/15 1545 Oral     SpO2 06/25/15 1545 95 %     Weight 06/25/15 1545 179 lb (81.194 kg)     Height 06/25/15 1545 5\' 4"  (1.626 m)     Head Cir --      Peak Flow --      Pain Score 06/25/15 1546 9     Pain Loc --      Pain Edu? --      Excl. in [place]? --  Constitutional: Alert and oriented. Chronically ill appearing and in no acute distress. Head: Atraumatic. Respiratory: Normal respiratory effort.   Musculoskeletal: Tenderness on the lateral upper tibia/lateral left knee to palpation. Limited ROM due to pain. Neurologic:  Normal speech and language. No gross focal neurologic deficits are appreciated. Speech is normal. No gait instability. Skin:  Small skin tears on exposed areas of forearms. Psychiatric: Mood and affect are normal. Speech and behavior are normal.  ____________________________________________   LABS (all labs ordered are listed, but only abnormal results are displayed)  Labs Reviewed - No data to display ____________________________________________  RADIOLOGY  Plain film was suspicious for nondisplaced tibial fracture. CT was ordered and nondisplaced tibial fracture confirmed. ____________________________________________   PROCEDURES  Procedure(s) performed:  Knee immobilizer was placed on left and instructions were given to the patient and her husband. The patient tolerated the application well with some immediate relief of pain.   ____________________________________________   INITIAL IMPRESSION / ASSESSMENT AND PLAN / ED COURSE  Pertinent labs & imaging results that were available during  my care of the patient were reviewed by me and considered in my medical decision making (see chart for details).  The patient is to follow-up with Dr. Mack Guise. She is to call tomorrow to schedule an appointment. She was given an additional prescription for morphine 30 mg as needed for breakthrough pain. The husband is to call her pain management provider tomorrow to advise them of the prescription and of her fracture care. The patient uses a walker at home, but she is considerably inactive due to chronic illness. She is to wear the knee immobilizer at all times, even during the night. They were both advised to return to the emergency department for symptoms that change or worsen if they're unable to see the primary care provider or orthopedics right away. ____________________________________________   FINAL CLINICAL IMPRESSION(S) / ED DIAGNOSES  Final diagnoses:  None       Victorino Dike, FNP 06/25/15 1934  Orbie Pyo, MD 06/25/15 2232

## 2015-06-25 NOTE — ED Notes (Signed)
States she fell this am  Having pain to right knee   Also has several skin tears to arms  Unable to bear full wt

## 2015-06-26 ENCOUNTER — Inpatient Hospital Stay: Payer: Medicare Other

## 2015-06-26 ENCOUNTER — Inpatient Hospital Stay: Payer: Medicare Other | Admitting: Internal Medicine

## 2015-07-01 ENCOUNTER — Ambulatory Visit: Admission: RE | Admit: 2015-07-01 | Payer: Medicare Other | Source: Ambulatory Visit

## 2015-07-05 ENCOUNTER — Ambulatory Visit: Payer: Medicare Other | Admitting: Internal Medicine

## 2015-07-05 ENCOUNTER — Other Ambulatory Visit: Payer: Medicare Other

## 2015-07-06 ENCOUNTER — Encounter: Payer: Self-pay | Admitting: Emergency Medicine

## 2015-07-06 ENCOUNTER — Emergency Department: Payer: Medicare Other

## 2015-07-06 ENCOUNTER — Inpatient Hospital Stay
Admission: EM | Admit: 2015-07-06 | Discharge: 2015-07-09 | DRG: 917 | Disposition: A | Discharge status: Skilled Nursing Facility | Payer: Medicare Other | Attending: Internal Medicine | Admitting: Internal Medicine

## 2015-07-06 DIAGNOSIS — S82144D Nondisplaced bicondylar fracture of right tibia, subsequent encounter for closed fracture with routine healing: Secondary | ICD-10-CM

## 2015-07-06 DIAGNOSIS — K589 Irritable bowel syndrome without diarrhea: Secondary | ICD-10-CM | POA: Diagnosis present

## 2015-07-06 DIAGNOSIS — I110 Hypertensive heart disease with heart failure: Secondary | ICD-10-CM | POA: Diagnosis present

## 2015-07-06 DIAGNOSIS — J449 Chronic obstructive pulmonary disease, unspecified: Secondary | ICD-10-CM | POA: Diagnosis present

## 2015-07-06 DIAGNOSIS — Z886 Allergy status to analgesic agent status: Secondary | ICD-10-CM

## 2015-07-06 DIAGNOSIS — N179 Acute kidney failure, unspecified: Secondary | ICD-10-CM | POA: Diagnosis present

## 2015-07-06 DIAGNOSIS — R4182 Altered mental status, unspecified: Secondary | ICD-10-CM

## 2015-07-06 DIAGNOSIS — E875 Hyperkalemia: Secondary | ICD-10-CM | POA: Diagnosis present

## 2015-07-06 DIAGNOSIS — E860 Dehydration: Secondary | ICD-10-CM | POA: Diagnosis present

## 2015-07-06 DIAGNOSIS — K7460 Unspecified cirrhosis of liver: Secondary | ICD-10-CM | POA: Diagnosis present

## 2015-07-06 DIAGNOSIS — M109 Gout, unspecified: Secondary | ICD-10-CM | POA: Diagnosis present

## 2015-07-06 DIAGNOSIS — J9610 Chronic respiratory failure, unspecified whether with hypoxia or hypercapnia: Secondary | ICD-10-CM | POA: Diagnosis present

## 2015-07-06 DIAGNOSIS — I5032 Chronic diastolic (congestive) heart failure: Secondary | ICD-10-CM | POA: Diagnosis present

## 2015-07-06 DIAGNOSIS — Z79899 Other long term (current) drug therapy: Secondary | ICD-10-CM | POA: Diagnosis not present

## 2015-07-06 DIAGNOSIS — G894 Chronic pain syndrome: Secondary | ICD-10-CM | POA: Diagnosis present

## 2015-07-06 DIAGNOSIS — Z9981 Dependence on supplemental oxygen: Secondary | ICD-10-CM | POA: Diagnosis not present

## 2015-07-06 DIAGNOSIS — Z888 Allergy status to other drugs, medicaments and biological substances status: Secondary | ICD-10-CM

## 2015-07-06 DIAGNOSIS — Z91040 Latex allergy status: Secondary | ICD-10-CM

## 2015-07-06 DIAGNOSIS — Z885 Allergy status to narcotic agent status: Secondary | ICD-10-CM

## 2015-07-06 DIAGNOSIS — E119 Type 2 diabetes mellitus without complications: Secondary | ICD-10-CM | POA: Diagnosis present

## 2015-07-06 DIAGNOSIS — D509 Iron deficiency anemia, unspecified: Secondary | ICD-10-CM | POA: Diagnosis present

## 2015-07-06 DIAGNOSIS — G4730 Sleep apnea, unspecified: Secondary | ICD-10-CM | POA: Diagnosis present

## 2015-07-06 DIAGNOSIS — W19XXXD Unspecified fall, subsequent encounter: Secondary | ICD-10-CM | POA: Diagnosis present

## 2015-07-06 DIAGNOSIS — Z880 Allergy status to penicillin: Secondary | ICD-10-CM | POA: Diagnosis not present

## 2015-07-06 DIAGNOSIS — G9341 Metabolic encephalopathy: Secondary | ICD-10-CM | POA: Diagnosis present

## 2015-07-06 DIAGNOSIS — Z87891 Personal history of nicotine dependence: Secondary | ICD-10-CM | POA: Diagnosis not present

## 2015-07-06 DIAGNOSIS — E871 Hypo-osmolality and hyponatremia: Secondary | ICD-10-CM | POA: Diagnosis present

## 2015-07-06 DIAGNOSIS — K219 Gastro-esophageal reflux disease without esophagitis: Secondary | ICD-10-CM | POA: Diagnosis present

## 2015-07-06 DIAGNOSIS — E7800 Pure hypercholesterolemia, unspecified: Secondary | ICD-10-CM | POA: Diagnosis present

## 2015-07-06 DIAGNOSIS — T402X1A Poisoning by other opioids, accidental (unintentional), initial encounter: Secondary | ICD-10-CM | POA: Diagnosis not present

## 2015-07-06 DIAGNOSIS — F329 Major depressive disorder, single episode, unspecified: Secondary | ICD-10-CM | POA: Diagnosis present

## 2015-07-06 DIAGNOSIS — Z9181 History of falling: Secondary | ICD-10-CM

## 2015-07-06 DIAGNOSIS — S8992XA Unspecified injury of left lower leg, initial encounter: Secondary | ICD-10-CM

## 2015-07-06 DIAGNOSIS — Y92009 Unspecified place in unspecified non-institutional (private) residence as the place of occurrence of the external cause: Secondary | ICD-10-CM

## 2015-07-06 DIAGNOSIS — E785 Hyperlipidemia, unspecified: Secondary | ICD-10-CM | POA: Diagnosis present

## 2015-07-06 DIAGNOSIS — Z803 Family history of malignant neoplasm of breast: Secondary | ICD-10-CM

## 2015-07-06 DIAGNOSIS — E039 Hypothyroidism, unspecified: Secondary | ICD-10-CM | POA: Diagnosis present

## 2015-07-06 DIAGNOSIS — Z7951 Long term (current) use of inhaled steroids: Secondary | ICD-10-CM

## 2015-07-06 LAB — COMPREHENSIVE METABOLIC PANEL
ALK PHOS: 141 U/L — AB (ref 38–126)
ALT: 19 U/L (ref 14–54)
AST: 45 U/L — ABNORMAL HIGH (ref 15–41)
Albumin: 3.6 g/dL (ref 3.5–5.0)
Anion gap: 8 (ref 5–15)
BILIRUBIN TOTAL: 0.4 mg/dL (ref 0.3–1.2)
BUN: 58 mg/dL — ABNORMAL HIGH (ref 6–20)
CALCIUM: 8.5 mg/dL — AB (ref 8.9–10.3)
CO2: 24 mmol/L (ref 22–32)
CREATININE: 2.2 mg/dL — AB (ref 0.44–1.00)
Chloride: 93 mmol/L — ABNORMAL LOW (ref 101–111)
GFR, EST AFRICAN AMERICAN: 27 mL/min — AB (ref >60)
GFR, EST NON AFRICAN AMERICAN: 23 mL/min — AB (ref >60)
Glucose, Bld: 185 mg/dL — ABNORMAL HIGH (ref 65–99)
Potassium: 6.5 mmol/L — ABNORMAL HIGH (ref 3.5–5.1)
SODIUM: 125 mmol/L — AB (ref 135–145)
TOTAL PROTEIN: 7 g/dL (ref 6.5–8.1)

## 2015-07-06 LAB — URINALYSIS COMPLETE WITH MICROSCOPIC (ARMC ONLY)
BILIRUBIN URINE: NEGATIVE
Bacteria, UA: NONE SEEN
Glucose, UA: NEGATIVE mg/dL
Hgb urine dipstick: NEGATIVE
KETONES UR: NEGATIVE mg/dL
LEUKOCYTES UA: NEGATIVE
Nitrite: NEGATIVE
Protein, ur: NEGATIVE mg/dL
SQUAMOUS EPITHELIAL / LPF: NONE SEEN
Specific Gravity, Urine: 1.009 (ref 1.005–1.030)
pH: 7 (ref 5.0–8.0)

## 2015-07-06 LAB — RENAL FUNCTION PANEL
ANION GAP: 8 (ref 5–15)
Albumin: 3.2 g/dL — ABNORMAL LOW (ref 3.5–5.0)
BUN: 55 mg/dL — AB (ref 6–20)
CALCIUM: 8 mg/dL — AB (ref 8.9–10.3)
CO2: 24 mmol/L (ref 22–32)
CREATININE: 1.99 mg/dL — AB (ref 0.44–1.00)
Chloride: 95 mmol/L — ABNORMAL LOW (ref 101–111)
GFR calc non Af Amer: 26 mL/min — ABNORMAL LOW (ref >60)
GFR, EST AFRICAN AMERICAN: 30 mL/min — AB (ref >60)
GLUCOSE: 135 mg/dL — AB (ref 65–99)
PHOSPHORUS: 3.9 mg/dL (ref 2.5–4.6)
Potassium: 7.2 mmol/L (ref 3.5–5.1)
SODIUM: 127 mmol/L — AB (ref 135–145)

## 2015-07-06 LAB — CBC
HCT: 35.7 % (ref 35.0–47.0)
Hemoglobin: 12 g/dL (ref 12.0–16.0)
MCH: 31.4 pg (ref 26.0–34.0)
MCHC: 33.6 g/dL (ref 32.0–36.0)
MCV: 93.5 fL (ref 80.0–100.0)
PLATELETS: 132 10*3/uL — AB (ref 150–440)
RBC: 3.82 MIL/uL (ref 3.80–5.20)
RDW: 14.7 % — ABNORMAL HIGH (ref 11.5–14.5)
WBC: 7 10*3/uL (ref 3.6–11.0)

## 2015-07-06 LAB — GLUCOSE, CAPILLARY
GLUCOSE-CAPILLARY: 85 mg/dL (ref 65–99)
Glucose-Capillary: 120 mg/dL — ABNORMAL HIGH (ref 65–99)

## 2015-07-06 LAB — MRSA PCR SCREENING: MRSA BY PCR: NEGATIVE

## 2015-07-06 LAB — AMMONIA: Ammonia: 21 umol/L (ref 9–35)

## 2015-07-06 LAB — APTT: APTT: 32 s (ref 24–36)

## 2015-07-06 LAB — PROTIME-INR
INR: 1.16
PROTHROMBIN TIME: 15 s (ref 11.4–15.0)

## 2015-07-06 LAB — TROPONIN I

## 2015-07-06 MED ORDER — PANTOPRAZOLE SODIUM 40 MG PO TBEC
40.0000 mg | DELAYED_RELEASE_TABLET | Freq: Every day | ORAL | Status: DC
  Administered 2015-07-07 – 2015-07-09 (×3): 40 mg via ORAL
  Filled 2015-07-06 (×3): qty 1

## 2015-07-06 MED ORDER — LISINOPRIL 10 MG PO TABS: 10.0000 mg | ORAL_TABLET | Freq: Every day | ORAL | Status: DC

## 2015-07-06 MED ORDER — MAGNESIUM OXIDE 400 (241.3 MG) MG PO TABS
400.0000 mg | ORAL_TABLET | Freq: Two times a day (BID) | ORAL | Status: DC
  Administered 2015-07-06 – 2015-07-08 (×4): 400 mg via ORAL
  Filled 2015-07-06 (×4): qty 1

## 2015-07-06 MED ORDER — ACETAMINOPHEN 325 MG PO TABS
650.0000 mg | ORAL_TABLET | Freq: Four times a day (QID) | ORAL | Status: DC | PRN
  Administered 2015-07-07 – 2015-07-08 (×2): 650 mg via ORAL
  Filled 2015-07-06 (×2): qty 2

## 2015-07-06 MED ORDER — SODIUM CHLORIDE 0.9 % IV SOLN
INTRAVENOUS | Status: DC
  Administered 2015-07-06 – 2015-07-08 (×5): via INTRAVENOUS

## 2015-07-06 MED ORDER — INSULIN ASPART 100 UNIT/ML ~~LOC~~ SOLN
SUBCUTANEOUS | Status: AC
  Filled 2015-07-06: qty 10

## 2015-07-06 MED ORDER — ACETAMINOPHEN 650 MG RE SUPP: 650.0000 mg | Freq: Four times a day (QID) | RECTAL | Status: DC | PRN

## 2015-07-06 MED ORDER — FE FUMARATE-B12-VIT C-FA-IFC PO CAPS
1.0000 | ORAL_CAPSULE | Freq: Three times a day (TID) | ORAL | Status: DC
  Filled 2015-07-06: qty 1

## 2015-07-06 MED ORDER — ONDANSETRON HCL 4 MG PO TABS: 4.0000 mg | ORAL_TABLET | Freq: Four times a day (QID) | ORAL | Status: DC | PRN

## 2015-07-06 MED ORDER — ARIPIPRAZOLE 2 MG PO TABS
5.0000 mg | ORAL_TABLET | Freq: Every day | ORAL | Status: DC
  Administered 2015-07-06 – 2015-07-08 (×3): 5 mg via ORAL
  Filled 2015-07-06: qty 3
  Filled 2015-07-06 (×2): qty 1
  Filled 2015-07-06: qty 3

## 2015-07-06 MED ORDER — CALCIUM GLUCONATE 10 % IV SOLN
1.0000 g | Freq: Once | INTRAVENOUS | Status: AC
  Administered 2015-07-06: 1 g via INTRAVENOUS
  Filled 2015-07-06: qty 10

## 2015-07-06 MED ORDER — DEXTROSE 50 % IV SOLN
12.5000 g | Freq: Once | INTRAVENOUS | Status: AC
  Administered 2015-07-06: 12.5 g via INTRAVENOUS
  Filled 2015-07-06: qty 50

## 2015-07-06 MED ORDER — METOCLOPRAMIDE HCL 5 MG PO TABS: 10.0000 mg | ORAL_TABLET | Freq: Four times a day (QID) | ORAL | Status: DC | PRN

## 2015-07-06 MED ORDER — HYDROMORPHONE HCL 2 MG PO TABS
2.0000 mg | ORAL_TABLET | Freq: Four times a day (QID) | ORAL | Status: DC | PRN
  Administered 2015-07-07 – 2015-07-09 (×7): 2 mg via ORAL
  Filled 2015-07-06 (×7): qty 1

## 2015-07-06 MED ORDER — FERROUS FUM-IRON POLYSACCH 162-115.2 MG PO CAPS: 1.0000 | ORAL_CAPSULE | Freq: Two times a day (BID) | ORAL | Status: DC

## 2015-07-06 MED ORDER — SODIUM BICARBONATE 8.4 % IV SOLN
50.0000 meq | INTRAVENOUS | Status: AC
  Administered 2015-07-06: 50 meq via INTRAVENOUS
  Filled 2015-07-06: qty 50

## 2015-07-06 MED ORDER — ALBUTEROL SULFATE (2.5 MG/3ML) 0.083% IN NEBU: 2.5000 mg | INHALATION_SOLUTION | Freq: Four times a day (QID) | RESPIRATORY_TRACT | Status: DC | PRN

## 2015-07-06 MED ORDER — SODIUM CHLORIDE 0.9 % IV BOLUS (SEPSIS)
1000.0000 mL | Freq: Once | INTRAVENOUS | Status: AC
  Administered 2015-07-06: 1000 mL via INTRAVENOUS

## 2015-07-06 MED ORDER — LEVOTHYROXINE SODIUM 88 MCG PO TABS
88.0000 ug | ORAL_TABLET | Freq: Every day | ORAL | Status: DC
  Administered 2015-07-07 – 2015-07-09 (×3): 88 ug via ORAL
  Filled 2015-07-06 (×3): qty 1

## 2015-07-06 MED ORDER — LAMOTRIGINE 100 MG PO TABS
150.0000 mg | ORAL_TABLET | Freq: Every day | ORAL | Status: DC
  Administered 2015-07-06 – 2015-07-08 (×3): 150 mg via ORAL
  Filled 2015-07-06 (×2): qty 2
  Filled 2015-07-06: qty 6

## 2015-07-06 MED ORDER — SERTRALINE HCL 100 MG PO TABS
100.0000 mg | ORAL_TABLET | Freq: Two times a day (BID) | ORAL | Status: DC
  Administered 2015-07-06 – 2015-07-09 (×6): 100 mg via ORAL
  Filled 2015-07-06 (×2): qty 1
  Filled 2015-07-06 (×2): qty 2
  Filled 2015-07-06 (×2): qty 1

## 2015-07-06 MED ORDER — INSULIN ASPART 100 UNIT/ML IV SOLN
10.0000 [IU] | Freq: Once | INTRAVENOUS | Status: DC
  Filled 2015-07-06: qty 0.1

## 2015-07-06 MED ORDER — ALLOPURINOL 100 MG PO TABS
100.0000 mg | ORAL_TABLET | Freq: Every day | ORAL | Status: DC
  Administered 2015-07-07 – 2015-07-09 (×3): 100 mg via ORAL
  Filled 2015-07-06 (×3): qty 1

## 2015-07-06 MED ORDER — METOPROLOL TARTRATE 25 MG PO TABS
25.0000 mg | ORAL_TABLET | Freq: Two times a day (BID) | ORAL | Status: DC
  Administered 2015-07-06 – 2015-07-09 (×6): 25 mg via ORAL
  Filled 2015-07-06 (×6): qty 1

## 2015-07-06 MED ORDER — ALBUTEROL SULFATE HFA 108 (90 BASE) MCG/ACT IN AERS: 2.0000 | INHALATION_SPRAY | RESPIRATORY_TRACT | Status: DC | PRN

## 2015-07-06 MED ORDER — HEPARIN SODIUM (PORCINE) 5000 UNIT/ML IJ SOLN: 5000.0000 [IU] | Freq: Three times a day (TID) | INTRAMUSCULAR | Status: DC

## 2015-07-06 MED ORDER — ONDANSETRON HCL 4 MG/2ML IJ SOLN: 4.0000 mg | Freq: Four times a day (QID) | INTRAMUSCULAR | Status: DC | PRN

## 2015-07-06 MED ORDER — MONTELUKAST SODIUM 10 MG PO TABS
10.0000 mg | ORAL_TABLET | Freq: Every day | ORAL | Status: DC
  Administered 2015-07-06 – 2015-07-08 (×3): 10 mg via ORAL
  Filled 2015-07-06 (×3): qty 1

## 2015-07-06 MED ORDER — SENNOSIDES-DOCUSATE SODIUM 8.6-50 MG PO TABS
1.0000 | ORAL_TABLET | Freq: Two times a day (BID) | ORAL | Status: DC
  Administered 2015-07-06 – 2015-07-09 (×6): 1 via ORAL
  Filled 2015-07-06 (×6): qty 1

## 2015-07-06 MED ORDER — ATORVASTATIN CALCIUM 10 MG PO TABS
10.0000 mg | ORAL_TABLET | Freq: Every day | ORAL | Status: DC
  Administered 2015-07-06 – 2015-07-08 (×3): 10 mg via ORAL
  Filled 2015-07-06 (×2): qty 1

## 2015-07-06 MED ORDER — LACTULOSE 10 GM/15ML PO SOLN
20.0000 g | Freq: Two times a day (BID) | ORAL | Status: DC | PRN
  Filled 2015-07-06: qty 30

## 2015-07-06 MED ORDER — SODIUM CHLORIDE 0.9 % IV BOLUS (SEPSIS)
500.0000 mL | Freq: Once | INTRAVENOUS | Status: AC
  Administered 2015-07-06: 500 mL via INTRAVENOUS

## 2015-07-06 MED ORDER — SODIUM CHLORIDE 0.9% FLUSH
3.0000 mL | Freq: Two times a day (BID) | INTRAVENOUS | Status: DC
  Administered 2015-07-06 – 2015-07-08 (×5): 3 mL via INTRAVENOUS

## 2015-07-06 MED ORDER — INSULIN ASPART 100 UNIT/ML ~~LOC~~ SOLN
0.0000 [IU] | Freq: Three times a day (TID) | SUBCUTANEOUS | Status: DC
  Administered 2015-07-07: 3 [IU] via SUBCUTANEOUS
  Administered 2015-07-08: 1 [IU] via SUBCUTANEOUS
  Administered 2015-07-08: 2 [IU] via SUBCUTANEOUS
  Administered 2015-07-09: 3 [IU] via SUBCUTANEOUS
  Filled 2015-07-06: qty 2
  Filled 2015-07-06: qty 1
  Filled 2015-07-06 (×2): qty 3

## 2015-07-06 MED ORDER — ADULT MULTIVITAMIN W/MINERALS CH
1.0000 | ORAL_TABLET | Freq: Every day | ORAL | Status: DC
  Administered 2015-07-07 – 2015-07-09 (×3): 1 via ORAL
  Filled 2015-07-06 (×3): qty 1

## 2015-07-06 MED ORDER — GABAPENTIN 300 MG PO CAPS
300.0000 mg | ORAL_CAPSULE | Freq: Every day | ORAL | Status: DC
  Administered 2015-07-06 – 2015-07-08 (×3): 300 mg via ORAL
  Filled 2015-07-06 (×3): qty 1

## 2015-07-06 MED ORDER — FERROUS FUMARATE 324 (106 FE) MG PO TABS
1.0000 | ORAL_TABLET | Freq: Two times a day (BID) | ORAL | Status: DC
  Administered 2015-07-07 – 2015-07-09 (×5): 106 mg via ORAL
  Filled 2015-07-06 (×7): qty 1

## 2015-07-06 MED ORDER — SODIUM CHLORIDE 0.9 % IV SOLN: 1.0000 g | Freq: Once | INTRAVENOUS | Status: DC

## 2015-07-06 MED ORDER — MOMETASONE FURO-FORMOTEROL FUM 100-5 MCG/ACT IN AERO
2.0000 | INHALATION_SPRAY | Freq: Two times a day (BID) | RESPIRATORY_TRACT | Status: DC
Start: 2015-07-06 — End: 2015-07-09
  Administered 2015-07-06 – 2015-07-08 (×5): 2 via RESPIRATORY_TRACT
  Filled 2015-07-06: qty 8.8

## 2015-07-06 MED ORDER — INSULIN ASPART 100 UNIT/ML IV SOLN
10.0000 [IU] | Freq: Once | INTRAVENOUS | Status: AC
  Administered 2015-07-06: 10 [IU] via INTRAVENOUS
  Filled 2015-07-06: qty 0.1

## 2015-07-06 MED ORDER — BISACODYL 10 MG RE SUPP: 10.0000 mg | Freq: Every day | RECTAL | Status: DC | PRN

## 2015-07-06 NOTE — Progress Notes (Addendum)
Dr Doy Hutching notified of K+=7.2.  Orders received, see MAR.

## 2015-07-06 NOTE — ED Provider Notes (Signed)
Time Seen: Approximately 1515  I have reviewed the triage notes  Chief Complaint: Altered Mental Status   History of Present Illness: Samantha Mcmillan is a 60 y.o. female who was brought here by EMS for evaluation of some altered mental status. Patient's currently here with her caretaker at home and states that the patient has had some episodes of confusion. The patient apparently was recently seen for a knee injury has been treated with narcotics without difficulty with pain control. Patient is acting like she did the last time that she was admitted to the hospital which was altered mental status. She apparently had hyponatremia at that time along with some dehydration. There's been no episodes of nausea or vomiting. Caretaker states that the mental status seems to wax and wane. She currently is awake alert and oriented and has not much in way of physical complaints.  Past Medical History  Diagnosis Date  . GERD (gastroesophageal reflux disease)   . Colon polyp   . Cirrhosis of liver ([place])   . Hypertension   . Depression   . COPD (chronic obstructive pulmonary disease) ([place])   . IBS (irritable bowel syndrome)   . On home oxygen therapy     "2L; 24/7" (05/04/2014)  . Family history of adverse reaction to anesthesia     "my sister doesn't wake up good when she's put deep to sleep"  . High cholesterol   . Heart murmur   . Asthma   . Pneumonia "several times"  . Chronic bronchitis ([place])     "get it pretty much q yr"   . Sleep apnea     "can't tolerate CPAP" (05/04/2014)  . Type II diabetes mellitus ([place])   . History of blood transfusion     "related to my anemia"  . Anemia   . Iron deficiency anemia   . Headache     "more than a couple times/wk" (05/04/2014)  . Arthritis     "some in my feet" (05/04/2014)  . Chronic lower back pain   . Anxiety   . Acute on chronic diastolic CHF (congestive heart failure) ([place]) 05/16/2014  . OCD (obsessive compulsive disorder)   . Panic attack    . PTSD (post-traumatic stress disorder)   . Chronic respiratory failure ([place])   . Cirrhosis of liver ([place])   . IDA (iron deficiency anemia) 08/14/2014  . Personal history of tobacco use, presenting hazards to health 06/21/2015    Patient Active Problem List   Diagnosis Date Noted  . Personal history of tobacco use, presenting hazards to health 06/21/2015  . Fall 04/16/2015  . Bacterial skin infection of leg 10/24/2014  . Edema leg 10/08/2014  . Hypertensive pulmonary vascular disease ([place]) 10/08/2014  . Chronic diastolic heart failure ([place]) 09/21/2014  . Tobacco use 09/21/2014  . Thrombocytopenia ([place]) 09/19/2014  . Leukopenia 09/19/2014  . Pancytopenia ([place]) 09/06/2014  . IDA (iron deficiency anemia) 08/14/2014  . Ascites 07/25/2014  . Cellulitis and abscess of lower extremity 07/07/2014  . Encephalopathy, hepatic ([place]) 05/18/2014  . Polypharmacy 05/18/2014  . Failure to thrive in adult 05/16/2014  . Hepatic cirrhosis ([place]) 05/16/2014  . Protein-calorie malnutrition, severe ([place]) 05/16/2014  . Advanced COPD ([place]) 05/16/2014  . Chronic back pain 05/16/2014  . Weakness 05/15/2014  . Hyponatremia 05/15/2014  . HCAP (healthcare-associated pneumonia) 05/09/2014  . Urinary retention 05/09/2014  . DM2 (diabetes mellitus, type 2) ([place]) 05/09/2014  . Hypothyroidism 05/05/2014    Past Surgical History  Procedure Laterality Date  .  Cesarean section  1979  . Cataract extraction w/ intraocular lens  implant, bilateral Bilateral 2005  . Back surgery    . Colonoscopy  2014  . Upper gastrointestinal endoscopy    . Posterior fusion cervical spine  2015    "rebuilt 3 of my neck vertebrae"  . Incision and drainage of wound Right 2009    "leg mauled  by dog"  . Tubal ligation  1979  . Dilation and curettage of uterus    . Peripheral vascular catheterization N/A 08/16/2014    Procedure: PICC Line Insertion;  Surgeon: Algernon Huxley, MD;  Location: [facility];  Service:  Cardiovascular;  Laterality: N/A;    Past Surgical History  Procedure Laterality Date  . Cesarean section  1979  . Cataract extraction w/ intraocular lens  implant, bilateral Bilateral 2005  . Back surgery    . Colonoscopy  2014  . Upper gastrointestinal endoscopy    . Posterior fusion cervical spine  2015    "rebuilt 3 of my neck vertebrae"  . Incision and drainage of wound Right 2009    "leg mauled  by dog"  . Tubal ligation  1979  . Dilation and curettage of uterus    . Peripheral vascular catheterization N/A 08/16/2014    Procedure: PICC Line Insertion;  Surgeon: Algernon Huxley, MD;  Location: [facility];  Service: Cardiovascular;  Laterality: N/A;    Current Outpatient Rx  Name  Route  Sig  Dispense  Refill  . albuterol (PROVENTIL HFA;VENTOLIN HFA) 108 (90 BASE) MCG/ACT inhaler   Inhalation   Inhale 2 puffs into the lungs every 4 (four) hours as needed for wheezing or shortness of breath.         Marland Kitchen albuterol (PROVENTIL) (2.5 MG/3ML) 0.083% nebulizer solution   Nebulization   Take 2.5 mg by nebulization every 6 (six) hours as needed for wheezing or shortness of breath.         . allopurinol (ZYLOPRIM) 100 MG tablet   Oral   Take 100 mg by mouth 2 (two) times daily.          . ARIPiprazole (ABILIFY) 5 MG tablet   Oral   Take 5 mg by mouth at bedtime.          Marland Kitchen atorvastatin (LIPITOR) 10 MG tablet   Oral   Take 10 mg by mouth at bedtime.          . budesonide-formoterol (SYMBICORT) 80-4.5 MCG/ACT inhaler   Inhalation   Inhale 2 puffs into the lungs 2 (two) times daily.          . Buprenorphine (BUTRANS) 15 MCG/HR PTWK   Transdermal   Place 15 mcg onto the skin once a week. Pt applies on Sunday.         . cyclobenzaprine (FLEXERIL) 10 MG tablet   Oral   Take 10 mg by mouth 3 (three) times daily as needed for muscle spasms.          . ferrous fumarate-iron polysaccharide complex (TANDEM) 162-115.2 MG CAPS   Oral   Take 1 capsule by mouth 2  (two) times daily after a meal.   60 capsule   5   . fluticasone (FLONASE) 50 MCG/ACT nasal spray   Each Nare   Place 1-2 sprays into both nostrils daily as needed for rhinitis.         Marland Kitchen gabapentin (NEURONTIN) 300 MG capsule   Oral   Take 300 mg  by mouth at bedtime.          Marland Kitchen guaiFENesin (MUCINEX) 600 MG 12 hr tablet   Oral   Take 600 mg by mouth 2 (two) times daily as needed for cough or to loosen phlegm.          Marland Kitchen HYDROmorphone (DILAUDID) 2 MG tablet   Oral   Take 2 mg by mouth every 6 (six) hours as needed for severe pain.          Marland Kitchen lactulose (CHRONULAC) 10 GM/15ML solution   Oral   Take 20 g by mouth 2 (two) times daily as needed for mild constipation.         Marland Kitchen lamoTRIgine (LAMICTAL) 150 MG tablet   Oral   Take 150 mg by mouth at bedtime.          Marland Kitchen levothyroxine (SYNTHROID, LEVOTHROID) 88 MCG tablet   Oral   Take 88 mcg by mouth daily before breakfast.          . lisinopril (ZESTRIL) 10 MG tablet   Oral   Take 1 tablet (10 mg total) by mouth daily.   30 tablet   0   . magnesium oxide (MAG-OX) 400 MG tablet   Oral   Take 400 mg by mouth 2 (two) times daily.         . metoCLOPramide (REGLAN) 10 MG tablet   Oral   Take 10 mg by mouth 4 (four) times daily as needed for nausea or vomiting.          . metoprolol tartrate (LOPRESSOR) 25 MG tablet   Oral   Take 25 mg by mouth 2 (two) times daily.         . montelukast (SINGULAIR) 10 MG tablet   Oral   Take 10 mg by mouth at bedtime.          Marland Kitchen morphine (MSIR) 30 MG tablet   Oral   Take 1 tablet (30 mg total) by mouth every 4 (four) hours as needed for severe pain (Breakthrough pain).   20 tablet   0   . Multiple Vitamin (MULTIVITAMIN WITH MINERALS) TABS tablet   Oral   Take 1 tablet by mouth daily.         . ondansetron (ZOFRAN) 4 MG tablet   Oral   Take 4 mg by mouth every 8 (eight) hours as needed for nausea or vomiting.          . pantoprazole (PROTONIX) 40 MG tablet    Oral   Take 40 mg by mouth daily.         Marland Kitchen senna-docusate (SENOKOT-S) 8.6-50 MG tablet   Oral   Take 1 tablet by mouth 2 (two) times daily.         . sertraline (ZOLOFT) 100 MG tablet   Oral   Take 100 mg by mouth 2 (two) times daily.          Marland Kitchen spironolactone (ALDACTONE) 25 MG tablet   Oral   Take 2 tablets (50 mg total) by mouth daily.   30 tablet   5     Allergies:  Fish allergy; Iodine; Latex; Percocet; Shellfish allergy; Amitriptyline; Tape; Wellbutrin; Augmentin; Codeine; Cortisone; Diphenhydramine; Other; and Vicodin  Family History: Family History  Problem Relation Age of Onset  . Cancer Father     pancreatic  . Cancer Brother   . Breast cancer Maternal Aunt     Social History: Social History  Substance Use  Topics  . Smoking status: Current Every Day Smoker -- 0.50 packs/day for 48 years    Types: Cigarettes  . Smokeless tobacco: Never Used  . Alcohol Use: No     Review of Systems:   10 point review of systems was performed and was otherwise negative:  Constitutional: No fever Eyes: No visual disturbances ENT: No sore throat, ear pain Cardiac: No chest pain Respiratory: No shortness of breath, wheezing, or stridor Abdomen: No abdominal pain, no vomiting, No diarrhea Endocrine: No weight loss, No night sweats Extremities: No peripheral edema, cyanosis Skin:Chronic cellulitis Neurologic: No focal weakness, trouble with speech or swollowingShe does have some diffuse muscle spasms. Urologic: No dysuria, Hematuria, or urinary frequency   Physical Exam:  ED Triage Vitals  Enc Vitals Group     BP 07/06/15 1435 113/53 mmHg     Pulse Rate 07/06/15 1435 75     Resp 07/06/15 1435 18     Temp 07/06/15 1435 97.8 F (36.6 C)     Temp Source 07/06/15 1435 Oral     SpO2 07/06/15 1435 99 %     Weight 07/06/15 1432 185 lb (83.915 kg)     Height 07/06/15 1432 5\' 4"  (1.626 m)     Head Cir --      Peak Flow --      Pain Score --      Pain Loc --       Pain Edu? --      Excl. in [place]? --     General: Awake , Alert , and Oriented times 3; GCS 15Patient's able to answer all questions appropriately Head: Normal cephalic , atraumatic Eyes: Pupils equal , round, reactive to light Nose/Throat: No nasal drainage, patent upper airway without erythema or exudate.  Neck: Supple, Full range of motion, No anterior adenopathy or palpable thyroid masses Lungs: Clear to ascultation without wheezes , rhonchi, or rales Heart: Regular rate, regular rhythm without murmurs , gallops , or rubs Abdomen: Soft, non tender without rebound, guarding , or rigidity; bowel sounds positive and symmetric in all 4 quadrants. No organomegaly .        Extremities: 2 plus symmetric pulses. No edema, clubbing or cyanosis Neurologic:Diffuse spasms of both upper and lower extremities Skin: warm, dry, no rashes   Labs:   All laboratory work was reviewed including any pertinent negatives or positives listed below:  Labs Reviewed  COMPREHENSIVE METABOLIC PANEL - Abnormal; Notable for the following:    Sodium 125 (*)    Potassium 6.5 (*)    Chloride 93 (*)    Glucose, Bld 185 (*)    BUN 58 (*)    Creatinine, Ser 2.20 (*)    Calcium 8.5 (*)    AST 45 (*)    Alkaline Phosphatase 141 (*)    GFR calc non Af Amer 23 (*)    GFR calc Af Amer 27 (*)    All other components within normal limits  CBC - Abnormal; Notable for the following:    RDW 14.7 (*)    Platelets 132 (*)    All other components within normal limits  URINALYSIS COMPLETEWITH MICROSCOPIC ([facility] ONLY) - Abnormal; Notable for the following:    Color, Urine YELLOW (*)    APPearance CLEAR (*)    All other components within normal limits  TROPONIN I    EKG: * ED ECG REPORT I, Daymon Larsen, the attending physician, personally viewed and interpreted this ECG.  Date: 07/06/2015 EKG Time:  1430 Rate: *75 Rhythm: normal sinus rhythm QRS Axis: normal Intervals: normal ST/T Wave abnormalities:  normal Conduction Disturbances: none Narrative Interpretation: unremarkable    Radiology:  CT Head Wo Contrast (Final result) Result time: 07/06/15 16:35:29   Final result by Rad Results In Interface (07/06/15 16:35:29)   Narrative:   CLINICAL DATA: Pt here with increased altered mental status over the last week per family member. Family states she is coming off of pain medication from a knee injury.  EXAM: CT HEAD WITHOUT CONTRAST  TECHNIQUE: Contiguous axial images were obtained from the base of the skull through the vertex without intravenous contrast.  COMPARISON: 04/15/2015  FINDINGS: There is central and cortical atrophy. Periventricular white matter changes are consistent with small vessel disease. There is no intra or extra-axial fluid collection or mass lesion. The basilar cisterns and ventricles have a normal appearance. There is no CT evidence for acute infarction or hemorrhage.  Bone windows show no acute fracture. Residual mild left frontal scalp edema. Paranasal and mastoid air cells are normally aerated.  Study quality is degraded by patient motion artifact.  IMPRESSION: 1. Atrophy and small vessel disease. 2. No evidence for acute intracranial abnormality.   Electronically Signed By: Nolon Nations M.D. On: 07/06/2015 16:35          DG Chest Portable 1 View (Final result) Result time: 07/06/15 16:09:19   Final result by Rad Results In Interface (07/06/15 16:09:19)   Narrative:   CLINICAL DATA: Confusion. Knee fracture 2 weeks ago.  EXAM: PORTABLE CHEST 1 VIEW  COMPARISON: 04/15/2015  FINDINGS: Patient is rotated to the right. Lungs are adequately inflated without focal consolidation or effusion. Cardiomediastinal silhouette and remainder of the exam is unchanged.  IMPRESSION: No active disease.   Electronically Signed By: Marin Olp M.D. On: 07/06/2015 16:09             I personally reviewed the  radiologic studies   P ED Course: * Differential diagnoses for altered mental status includes but is not exclusive to alcohol, illicit or prescription medications, intracranial pathology such as stroke, intracerebral hemorrhage, fever or infectious causes including sepsis, hypoxemia, uremia, trauma, endocrine related disorders such as diabetes, hypoglycemia, thyroid-related diseases, etc. Based on the patient's clinical presentation and objective findings this fall with ammonia pending patient most likely has hyponatremia along with some renal insufficiency. Patient's ammonia level was pending and she may have hepatic encephalopathy    Assessment:  Altered mental status      Plan:  Inpatient management            Daymon Larsen, MD 07/06/15 [phone]

## 2015-07-06 NOTE — ED Notes (Signed)
Report given to Beth, RN

## 2015-07-06 NOTE — ED Notes (Signed)
Called lab and was told could send dark green tube for both ammonia and potassium per Murray Hodgkins

## 2015-07-06 NOTE — ED Notes (Signed)
Pt presents to ED via [facility], dx with knee fracture approx 2 weeks ago. Per EMS pt was sent home with PO morphine, pt was started on 5mg  then bumped up to 30mg  then backed down to 15mg  PO approx 4 days ago. Pt presents alert and oriented x 4, states that "sometimes I'll be talking to my sister, then I'll say she isn't here and go on about my day". Pt alert and oriented at this time, NAD noted.

## 2015-07-06 NOTE — H&P (Signed)
History and Physical    Samantha Mcmillan [id number] DOB: [birth year] DOA: 07/06/2015  Referring physician: Dr. Marcelene Butte PCP: Casilda Carls, MD  Specialists: none  Chief Complaint:  Lethargy and confusion  HPI: Samantha Mcmillan is a 60 y.o. female has a past medical history significant for cirrhosis, COPD, OCD, and DM with recent left knee "fracture" on po morphine now with 3 day hx of worsening lethargy and confusion. Pt is confused now, hx comes from husband. Pt has been hallucinating and "talking out of her head" for 2-3 days. Not eating or drinking. Using po morphine for knee pain. In ER, pt noted to be dehydrated in ARF with hyperkalemia and hyponatremia. Husband states that she has been "twitching" at home. She is now admitted.  ROS: unable to obtain due to altered mental status  Past Medical History  Diagnosis Date  . GERD (gastroesophageal reflux disease)   . Colon polyp   . Cirrhosis of liver ([place])   . Hypertension   . Depression   . COPD (chronic obstructive pulmonary disease) ([place])   . IBS (irritable bowel syndrome)   . On home oxygen therapy     "2L; 24/7" (05/04/2014)  . Family history of adverse reaction to anesthesia     "my sister doesn't wake up good when she's put deep to sleep"  . High cholesterol   . Heart murmur   . Asthma   . Pneumonia "several times"  . Chronic bronchitis ([place])     "get it pretty much q yr"   . Sleep apnea     "can't tolerate CPAP" (05/04/2014)  . Type II diabetes mellitus ([place])   . History of blood transfusion     "related to my anemia"  . Anemia   . Iron deficiency anemia   . Headache     "more than a couple times/wk" (05/04/2014)  . Arthritis     "some in my feet" (05/04/2014)  . Chronic lower back pain   . Anxiety   . Acute on chronic diastolic CHF (congestive heart failure) ([place]) 05/16/2014  . OCD (obsessive compulsive disorder)   . Panic attack   . PTSD (post-traumatic stress disorder)   . Chronic respiratory failure  ([place])   . Cirrhosis of liver ([place])   . IDA (iron deficiency anemia) 08/14/2014  . Personal history of tobacco use, presenting hazards to health 06/21/2015   Past Surgical History  Procedure Laterality Date  . Cesarean section  1979  . Cataract extraction w/ intraocular lens  implant, bilateral Bilateral 2005  . Back surgery    . Colonoscopy  2014  . Upper gastrointestinal endoscopy    . Posterior fusion cervical spine  2015    "rebuilt 3 of my neck vertebrae"  . Incision and drainage of wound Right 2009    "leg mauled  by dog"  . Tubal ligation  1979  . Dilation and curettage of uterus    . Peripheral vascular catheterization N/A 08/16/2014    Procedure: PICC Line Insertion;  Surgeon: Algernon Huxley, MD;  Location: [facility];  Service: Cardiovascular;  Laterality: N/A;   Social History:  reports that she has been smoking Cigarettes.  She has a 24 pack-year smoking history. She has never used smokeless tobacco. She reports that she does not drink alcohol or use illicit drugs.  Allergies  Allergen Reactions  . Fish Allergy Anaphylaxis and Swelling  . Iodine Shortness Of Breath  . Latex Anaphylaxis  . Percocet [Oxycodone-Acetaminophen] Shortness  Of Breath  . Shellfish Allergy Anaphylaxis and Swelling  . Amitriptyline Other (See Comments)    Reaction:  Mental changes   . Tape Other (See Comments)    Pt states that it pulls her skin off.   . Wellbutrin [Bupropion] Other (See Comments)    Reaction:  Mental changes   . Augmentin [Amoxicillin-Pot Clavulanate] Rash and Other (See Comments)    Has patient had a PCN reaction causing immediate rash, facial/tongue/throat swelling, SOB or lightheadedness with hypotension: No Has patient had a PCN reaction causing severe rash involving mucus membranes or skin necrosis: No Has patient had a PCN reaction that required hospitalization No Has patient had a PCN reaction occurring within the last 10 years: No If all of the above answers are  "NO", then may proceed with Cephalosporin use.  . Codeine Rash  . Cortisone Rash  . Diphenhydramine Rash  . Other Rash and Other (See Comments)    Pt states that she is allergic to Darvocet.   . Vicodin [Hydrocodone-Acetaminophen] Rash and Other (See Comments)    Reaction:  Dizziness      Family History  Problem Relation Age of Onset  . Cancer Father     pancreatic  . Cancer Brother   . Breast cancer Maternal Aunt     Prior to Admission medications   Medication Sig Start Date End Date Taking? Authorizing Provider  albuterol (PROVENTIL HFA;VENTOLIN HFA) 108 (90 BASE) MCG/ACT inhaler Inhale 2 puffs into the lungs every 4 (four) hours as needed for wheezing or shortness of breath.    Historical Provider, MD  albuterol (PROVENTIL) (2.5 MG/3ML) 0.083% nebulizer solution Take 2.5 mg by nebulization every 6 (six) hours as needed for wheezing or shortness of breath.    Historical Provider, MD  allopurinol (ZYLOPRIM) 100 MG tablet Take 100 mg by mouth 2 (two) times daily.     Historical Provider, MD  ARIPiprazole (ABILIFY) 5 MG tablet Take 5 mg by mouth at bedtime.     Historical Provider, MD  atorvastatin (LIPITOR) 10 MG tablet Take 10 mg by mouth at bedtime.     Historical Provider, MD  budesonide-formoterol (SYMBICORT) 80-4.5 MCG/ACT inhaler Inhale 2 puffs into the lungs 2 (two) times daily.     Historical Provider, MD  Buprenorphine (BUTRANS) 15 MCG/HR PTWK Place 15 mcg onto the skin once a week. Pt applies on Sunday.    Historical Provider, MD  cyclobenzaprine (FLEXERIL) 10 MG tablet Take 10 mg by mouth 3 (three) times daily as needed for muscle spasms.     Historical Provider, MD  ferrous fumarate-iron polysaccharide complex (TANDEM) 162-115.2 MG CAPS Take 1 capsule by mouth 2 (two) times daily after a meal. 10/10/14   Evlyn Kanner, NP  fluticasone (FLONASE) 50 MCG/ACT nasal spray Place 1-2 sprays into both nostrils daily as needed for rhinitis.    Historical Provider, MD  gabapentin  (NEURONTIN) 300 MG capsule Take 300 mg by mouth at bedtime.     Historical Provider, MD  guaiFENesin (MUCINEX) 600 MG 12 hr tablet Take 600 mg by mouth 2 (two) times daily as needed for cough or to loosen phlegm.     Historical Provider, MD  HYDROmorphone (DILAUDID) 2 MG tablet Take 2 mg by mouth every 6 (six) hours as needed for severe pain.     Historical Provider, MD  lactulose (CHRONULAC) 10 GM/15ML solution Take 20 g by mouth 2 (two) times daily as needed for mild constipation.    Historical Provider, MD  lamoTRIgine (LAMICTAL) 150 MG tablet Take 150 mg by mouth at bedtime.     Historical Provider, MD  levothyroxine (SYNTHROID, LEVOTHROID) 88 MCG tablet Take 88 mcg by mouth daily before breakfast.     Historical Provider, MD  lisinopril (ZESTRIL) 10 MG tablet Take 1 tablet (10 mg total) by mouth daily. 04/17/15   Epifanio Lesches, MD  magnesium oxide (MAG-OX) 400 MG tablet Take 400 mg by mouth 2 (two) times daily.    Historical Provider, MD  metoCLOPramide (REGLAN) 10 MG tablet Take 10 mg by mouth 4 (four) times daily as needed for nausea or vomiting.     Historical Provider, MD  metoprolol tartrate (LOPRESSOR) 25 MG tablet Take 25 mg by mouth 2 (two) times daily.    Historical Provider, MD  montelukast (SINGULAIR) 10 MG tablet Take 10 mg by mouth at bedtime.     Historical Provider, MD  morphine (MSIR) 30 MG tablet Take 1 tablet (30 mg total) by mouth every 4 (four) hours as needed for severe pain (Breakthrough pain). 06/25/15   Victorino Dike, FNP  Multiple Vitamin (MULTIVITAMIN WITH MINERALS) TABS tablet Take 1 tablet by mouth daily. 05/18/14   Christina P Rama, MD  ondansetron (ZOFRAN) 4 MG tablet Take 4 mg by mouth every 8 (eight) hours as needed for nausea or vomiting.     Historical Provider, MD  pantoprazole (PROTONIX) 40 MG tablet Take 40 mg by mouth daily.    Historical Provider, MD  senna-docusate (SENOKOT-S) 8.6-50 MG tablet Take 1 tablet by mouth 2 (two) times daily.    Historical  Provider, MD  sertraline (ZOLOFT) 100 MG tablet Take 100 mg by mouth 2 (two) times daily.     Historical Provider, MD  spironolactone (ALDACTONE) 25 MG tablet Take 2 tablets (50 mg total) by mouth daily. 07/25/14 07/25/15  Lucilla Lame, MD   Physical Exam: Filed Vitals:   07/06/15 1444 07/06/15 1500 07/06/15 1608 07/06/15 1630  BP:  116/70 127/57 109/55  Pulse:  82 79 71  Temp:      TempSrc:      Resp:  20 15 18   Height:      Weight:      SpO2: 97% 100% 90% 100%     General:  Frail appearing in mild distress, lethargic and confused, [place]/AT  Eyes: PERRL, EOMI, no scleral icterus, conjunctiva clear  ENT: dry oropharynx without lesions or exudate, dentition poor, TM's benign  Neck: supple, no lymphadenopathy. No thyromegaly or bruits  Cardiovascular: regular rate with 2/6 systolic murmur noted. No rubs or gallops; 2+ peripheral pulses, no JVD, 1+ peripheral edema with stasis changes noted  Respiratory: scattered rhonchi without wheezes or rales, respiratory effort normal  Abdomen: soft, non tender to palpation, positive bowel sounds, no guarding, no rebound  Skin: no rashes  Musculoskeletal: normal bulk and tone, no joint swelling  Psychiatric: lethargic, confused. Oriented x 1 only. Responds to painful stimulii  Neurologic: CN 2-12 grossly intact, Motor strength 5/5 in all 4 groups, DTR's symmetric, sensory exam non-focal  Labs on Admission:  Basic Metabolic Panel:  Recent Labs Lab 07/06/15 1432  NA 125*  K 6.5*  CL 93*  CO2 24  GLUCOSE 185*  BUN 58*  CREATININE 2.20*  CALCIUM 8.5*   Liver Function Tests:  Recent Labs Lab 07/06/15 1432  AST 45*  ALT 19  ALKPHOS 141*  BILITOT 0.4  PROT 7.0  ALBUMIN 3.6   No results for input(s): LIPASE, AMYLASE in the last 168 hours.  No results for input(s): AMMONIA in the last 168 hours. CBC:  Recent Labs Lab 07/06/15 1432  WBC 7.0  HGB 12.0  HCT 35.7  MCV 93.5  PLT 132*   Cardiac Enzymes:  Recent Labs Lab  07/06/15 1432  TROPONINI <0.03    BNP (last 3 results)  Recent Labs  08/26/14 1834 09/06/14 1215  BNP 90.0 99.0    ProBNP (last 3 results) No results for input(s): PROBNP in the last 8760 hours.  CBG: No results for input(s): GLUCAP in the last 168 hours.  Radiological Exams on Admission: Ct Head Wo Contrast  07/06/2015  CLINICAL DATA:  Pt here with increased altered mental status over the last week per family member. Family states she is coming off of pain medication from a knee injury. EXAM: CT HEAD WITHOUT CONTRAST TECHNIQUE: Contiguous axial images were obtained from the base of the skull through the vertex without intravenous contrast. COMPARISON:  04/15/2015 FINDINGS: There is central and cortical atrophy. Periventricular white matter changes are consistent with small vessel disease. There is no intra or extra-axial fluid collection or mass lesion. The basilar cisterns and ventricles have a normal appearance. There is no CT evidence for acute infarction or hemorrhage. Bone windows show no acute fracture. Residual mild left frontal scalp edema. Paranasal and mastoid air cells are normally aerated. Study quality is degraded by patient motion artifact. IMPRESSION: 1. Atrophy and small vessel disease. 2.  No evidence for acute intracranial abnormality. Electronically Signed   By: Nolon Nations M.D.   On: 07/06/2015 16:35   Dg Chest Portable 1 View  07/06/2015  CLINICAL DATA:  Confusion.  Knee fracture 2 weeks ago. EXAM: PORTABLE CHEST 1 VIEW COMPARISON:  04/15/2015 FINDINGS: Patient is rotated to the right. Lungs are adequately inflated without focal consolidation or effusion. Cardiomediastinal silhouette and remainder of the exam is unchanged. IMPRESSION: No active disease. Electronically Signed   By: Marin Olp M.D.   On: 07/06/2015 16:09    EKG: Independently reviewed.  Assessment/Plan Active Problems:   Hyponatremia   ARF (acute renal failure) (HCC)   Hyperkalemia    Dehydration   Will admit to stepdown with IV fluids and telemetry. IV insulin given in ER. Repeat K+ in 2-3 hours. Consult Nephrology. Follow sugars. Consult Ortho for knee pain. PT eval as well. Hold narcotics until fully alert.  Diet: clear liquids Fluids: NS@125  DVT Prophylaxis: SQ Heparin  Code Status: FULL  Family Communication: yes  Disposition Plan: home  Time spent: 55 min

## 2015-07-07 ENCOUNTER — Inpatient Hospital Stay: Payer: Medicare Other

## 2015-07-07 LAB — GLUCOSE, CAPILLARY
GLUCOSE-CAPILLARY: 108 mg/dL — AB (ref 65–99)
GLUCOSE-CAPILLARY: 221 mg/dL — AB (ref 65–99)
GLUCOSE-CAPILLARY: 84 mg/dL (ref 65–99)
Glucose-Capillary: 176 mg/dL — ABNORMAL HIGH (ref 65–99)

## 2015-07-07 LAB — COMPREHENSIVE METABOLIC PANEL
ALT: 16 U/L (ref 14–54)
AST: 29 U/L (ref 15–41)
Albumin: 3 g/dL — ABNORMAL LOW (ref 3.5–5.0)
Alkaline Phosphatase: 119 U/L (ref 38–126)
Anion gap: 5 (ref 5–15)
BILIRUBIN TOTAL: 0.5 mg/dL (ref 0.3–1.2)
BUN: 43 mg/dL — AB (ref 6–20)
CO2: 24 mmol/L (ref 22–32)
CREATININE: 1.6 mg/dL — AB (ref 0.44–1.00)
Calcium: 8.3 mg/dL — ABNORMAL LOW (ref 8.9–10.3)
Chloride: 105 mmol/L (ref 101–111)
GFR calc Af Amer: 39 mL/min — ABNORMAL LOW (ref >60)
GFR, EST NON AFRICAN AMERICAN: 34 mL/min — AB (ref >60)
Glucose, Bld: 118 mg/dL — ABNORMAL HIGH (ref 65–99)
POTASSIUM: 5.6 mmol/L — AB (ref 3.5–5.1)
Sodium: 134 mmol/L — ABNORMAL LOW (ref 135–145)
TOTAL PROTEIN: 6 g/dL — AB (ref 6.5–8.1)

## 2015-07-07 LAB — BASIC METABOLIC PANEL
ANION GAP: 6 (ref 5–15)
BUN: 38 mg/dL — ABNORMAL HIGH (ref 6–20)
CALCIUM: 8 mg/dL — AB (ref 8.9–10.3)
CO2: 21 mmol/L — AB (ref 22–32)
CREATININE: 1.53 mg/dL — AB (ref 0.44–1.00)
Chloride: 105 mmol/L (ref 101–111)
GFR, EST AFRICAN AMERICAN: 41 mL/min — AB (ref >60)
GFR, EST NON AFRICAN AMERICAN: 36 mL/min — AB (ref >60)
Glucose, Bld: 222 mg/dL — ABNORMAL HIGH (ref 65–99)
Potassium: 6.6 mmol/L (ref 3.5–5.1)
SODIUM: 132 mmol/L — AB (ref 135–145)

## 2015-07-07 LAB — CBC
HCT: 31.2 % — ABNORMAL LOW (ref 35.0–47.0)
Hemoglobin: 10.7 g/dL — ABNORMAL LOW (ref 12.0–16.0)
MCH: 31.6 pg (ref 26.0–34.0)
MCHC: 34.1 g/dL (ref 32.0–36.0)
MCV: 92.6 fL (ref 80.0–100.0)
PLATELETS: 106 10*3/uL — AB (ref 150–440)
RBC: 3.37 MIL/uL — ABNORMAL LOW (ref 3.80–5.20)
RDW: 14.8 % — AB (ref 11.5–14.5)
WBC: 4.1 10*3/uL (ref 3.6–11.0)

## 2015-07-07 LAB — RENAL FUNCTION PANEL
ALBUMIN: 3.1 g/dL — AB (ref 3.5–5.0)
ANION GAP: 7 (ref 5–15)
BUN: 53 mg/dL — ABNORMAL HIGH (ref 6–20)
CALCIUM: 8.5 mg/dL — AB (ref 8.9–10.3)
CO2: 26 mmol/L (ref 22–32)
Chloride: 100 mmol/L — ABNORMAL LOW (ref 101–111)
Creatinine, Ser: 1.8 mg/dL — ABNORMAL HIGH (ref 0.44–1.00)
GFR calc non Af Amer: 29 mL/min — ABNORMAL LOW (ref >60)
GFR, EST AFRICAN AMERICAN: 34 mL/min — AB (ref >60)
GLUCOSE: 89 mg/dL (ref 65–99)
PHOSPHORUS: 3.8 mg/dL (ref 2.5–4.6)
POTASSIUM: 5.9 mmol/L — AB (ref 3.5–5.1)
SODIUM: 133 mmol/L — AB (ref 135–145)

## 2015-07-07 MED ORDER — DEXTROSE 50 % IV SOLN
25.0000 mL | Freq: Once | INTRAVENOUS | Status: AC
  Administered 2015-07-07: 25 mL via INTRAVENOUS
  Filled 2015-07-07: qty 50

## 2015-07-07 MED ORDER — INSULIN REGULAR HUMAN 100 UNIT/ML IJ SOLN
10.0000 [IU] | Freq: Once | INTRAMUSCULAR | Status: AC
Start: 2015-07-07 — End: 2015-07-07
  Administered 2015-07-07: 10 [IU] via INTRAVENOUS
  Filled 2015-07-07: qty 0.1

## 2015-07-07 MED ORDER — GUAIFENESIN ER 600 MG PO TB12
600.0000 mg | ORAL_TABLET | Freq: Two times a day (BID) | ORAL | Status: DC | PRN
Start: 2015-07-07 — End: 2015-07-09
  Administered 2015-07-07 – 2015-07-08 (×2): 600 mg via ORAL
  Filled 2015-07-07 (×2): qty 1

## 2015-07-07 MED ORDER — SODIUM POLYSTYRENE SULFONATE 15 GM/60ML PO SUSP
30.0000 g | Freq: Once | ORAL | Status: AC
  Administered 2015-07-07: 30 g via ORAL
  Filled 2015-07-07: qty 120

## 2015-07-07 MED ORDER — SPIRONOLACTONE 25 MG PO TABS: 25.0000 mg | ORAL_TABLET | Freq: Every day | ORAL | Status: DC

## 2015-07-07 NOTE — Progress Notes (Signed)
Patient to be transferred to skilled nursing facility.  Patient to transfer to floor with telemetry.

## 2015-07-07 NOTE — Progress Notes (Signed)
Subjective:   Patient known to our practice from outpatient Outpatient Cr 0.78 in March Admission Cr 2.20 Improved today to 1.53   Objective:  Vital signs in last 24 hours:  Temp:  [97.8 F (36.6 C)-98.5 F (36.9 C)] 97.8 F (36.6 C) (05/14 0835) Pulse Rate:  [69-82] 73 (05/14 0835) Resp:  [11-26] 12 (05/14 0835) BP: (109-165)/(53-73) 165/73 mmHg (05/14 0900) SpO2:  [90 %-100 %] 99 % (05/14 0835) Weight:  [82.5 kg (181 lb 14.1 oz)-83.915 kg (185 lb)] 82.5 kg (181 lb 14.1 oz) (05/13 2000)  Weight change:  Filed Weights   07/06/15 1432 07/06/15 2000  Weight: 83.915 kg (185 lb) 82.5 kg (181 lb 14.1 oz)    Intake/Output:    Intake/Output Summary (Last 24 hours) at 07/07/15 1052 Last data filed at 07/07/15 [id number]  Gross per 24 hour  Intake 1760.42 ml  Output   1200 ml  Net 560.42 ml     Physical Exam: General: NAD, laying in bed  HEENT Moist oral mucus membranes  Neck supple  Pulm/lungs Normal effort, clear  CVS/Heart No rub or gallop  Abdomen:  Soft, NT  Extremities: Trace edema  Neurologic: Alert, oreinted             Basic Metabolic Panel:   Recent Labs Lab 07/06/15 1432 07/06/15 1919 07/06/15 2330 07/07/15 0537 07/07/15 1009  NA 125* 127* 133* 134* 132*  K 6.5* 7.2* 5.9* 5.6* 6.6*  CL 93* 95* 100* 105 105  CO2 24 24 26 24  21*  GLUCOSE 185* 135* 89 118* 222*  BUN 58* 55* 53* 43* 38*  CREATININE 2.20* 1.99* 1.80* 1.60* 1.53*  CALCIUM 8.5* 8.0* 8.5* 8.3* 8.0*  PHOS  --  3.9 3.8  --   --      CBC:  Recent Labs Lab 07/06/15 1432 07/07/15 0537  WBC 7.0 4.1  HGB 12.0 10.7*  HCT 35.7 31.2*  MCV 93.5 92.6  PLT 132* 106*      Microbiology:  Recent Results (from the past 720 hour(s))  MRSA PCR Screening     Status: None   Collection Time: 07/06/15  7:44 PM  Result Value Ref Range Status   MRSA by PCR NEGATIVE NEGATIVE Final    Comment:        The GeneXpert MRSA Assay (FDA approved for NASAL specimens only), is one component of  a comprehensive MRSA colonization surveillance program. It is not intended to diagnose MRSA infection nor to guide or monitor treatment for MRSA infections.     Coagulation Studies:  Recent Labs  07/06/15 2149  LABPROT 15.0  INR 1.16    Urinalysis:  Recent Labs  07/06/15 1647  COLORURINE YELLOW*  LABSPEC 1.009  PHURINE 7.0  GLUCOSEU NEGATIVE  HGBUR NEGATIVE  BILIRUBINUR NEGATIVE  KETONESUR NEGATIVE  PROTEINUR NEGATIVE  NITRITE NEGATIVE  LEUKOCYTESUR NEGATIVE      Imaging: Ct Head Wo Contrast  07/06/2015  CLINICAL DATA:  Pt here with increased altered mental status over the last week per family member. Family states she is coming off of pain medication from a knee injury. EXAM: CT HEAD WITHOUT CONTRAST TECHNIQUE: Contiguous axial images were obtained from the base of the skull through the vertex without intravenous contrast. COMPARISON:  04/15/2015 FINDINGS: There is central and cortical atrophy. Periventricular white matter changes are consistent with small vessel disease. There is no intra or extra-axial fluid collection or mass lesion. The basilar cisterns and ventricles have a normal appearance. There is no CT evidence for  acute infarction or hemorrhage. Bone windows show no acute fracture. Residual mild left frontal scalp edema. Paranasal and mastoid air cells are normally aerated. Study quality is degraded by patient motion artifact. IMPRESSION: 1. Atrophy and small vessel disease. 2.  No evidence for acute intracranial abnormality. Electronically Signed   By: Nolon Nations M.D.   On: 07/06/2015 16:35   Dg Chest Portable 1 View  07/06/2015  CLINICAL DATA:  Confusion.  Knee fracture 2 weeks ago. EXAM: PORTABLE CHEST 1 VIEW COMPARISON:  04/15/2015 FINDINGS: Patient is rotated to the right. Lungs are adequately inflated without focal consolidation or effusion. Cardiomediastinal silhouette and remainder of the exam is unchanged. IMPRESSION: No active disease.  Electronically Signed   By: Marin Olp M.D.   On: 07/06/2015 16:09     Medications:   . sodium chloride 125 mL/hr at 07/07/15 0420   . allopurinol  100 mg Oral Daily  . ARIPiprazole  5 mg Oral QHS  . atorvastatin  10 mg Oral QHS  . Ferrous Fumarate  1 tablet Oral BID WC  . gabapentin  300 mg Oral QHS  . insulin aspart  0-9 Units Subcutaneous TID WC  . lamoTRIgine  150 mg Oral QHS  . levothyroxine  88 mcg Oral QAC breakfast  . magnesium oxide  400 mg Oral BID  . metoprolol tartrate  25 mg Oral BID  . mometasone-formoterol  2 puff Inhalation BID  . montelukast  10 mg Oral QHS  . multivitamin with minerals  1 tablet Oral Daily  . pantoprazole  40 mg Oral Daily  . senna-docusate  1 tablet Oral BID  . sertraline  100 mg Oral BID  . sodium chloride flush  3 mL Intravenous Q12H   acetaminophen **OR** acetaminophen, albuterol, bisacodyl, HYDROmorphone, lactulose, metoCLOPramide, ondansetron **OR** ondansetron (ZOFRAN) IV  Assessment/ Plan:  60 y.o.caucasian female  with anemia, hypothyroidism, hyponatremia, COPD, gout, hypertension, chronic edema, hyperlipidemia, diabetes mellitus type 2, GERD , Cirrhosis  1. ARF, likely due to pre-renal state. Improved  2  Hyperkalemia- treated with shifting measures  Recommend discontinue ACE-i and spironolactone SPS for hyperkalemia Repeat K later today Will set up follow up in office   D/w Dr Benjie Karvonen       LOS: 1 Zamari Vea 5/14/201710:52 AM

## 2015-07-07 NOTE — NC FL2 (Signed)
[place] LEVEL OF CARE SCREENING TOOL     IDENTIFICATION  Patient Name: Samantha Mcmillan Birthdate: Jan 25, 1956 Sex: female Admission Date (Current Location): 07/06/2015  [place] and [place] Number:  Engineering geologist and Address:  [facility], [address], [place], [place] [address]      Provider Number: [id number]  Attending Physician Name and Address:  Bettey Costa, MD  Relative Name and Phone Number:       Current Level of Care: Hospital Recommended Level of Care: [place] Prior Approval Number:    Date Approved/Denied:   PASRR Number:   [id number]   Discharge Plan: SNF    Current Diagnoses: Patient Active Problem List   Diagnosis Date Noted  . ARF (acute renal failure) ([place]) 07/06/2015  . Hyperkalemia 07/06/2015  . Dehydration 07/06/2015  . Personal history of tobacco use, presenting hazards to health 06/21/2015  . Fall 04/16/2015  . Bacterial skin infection of leg 10/24/2014  . Edema leg 10/08/2014  . Hypertensive pulmonary vascular disease ([place]) 10/08/2014  . Chronic diastolic heart failure ([place]) 09/21/2014  . Tobacco use 09/21/2014  . Thrombocytopenia ([place]) 09/19/2014  . Leukopenia 09/19/2014  . Pancytopenia ([place]) 09/06/2014  . IDA (iron deficiency anemia) 08/14/2014  . Ascites 07/25/2014  . Cellulitis and abscess of lower extremity 07/07/2014  . Encephalopathy, hepatic ([place]) 05/18/2014  . Polypharmacy 05/18/2014  . Failure to thrive in adult 05/16/2014  . Hepatic cirrhosis ([place]) 05/16/2014  . Protein-calorie malnutrition, severe ([place]) 05/16/2014  . Advanced COPD ([place]) 05/16/2014  . Chronic back pain 05/16/2014  . Weakness 05/15/2014  . Hyponatremia 05/15/2014  . HCAP (healthcare-associated pneumonia) 05/09/2014  . Urinary retention 05/09/2014  . DM2 (diabetes mellitus, type 2) ([place]) 05/09/2014  . Hypothyroidism 05/05/2014    Orientation RESPIRATION BLADDER Height & Weight     Self,  Time, Situation, Place  Normal Continent Weight: 181 lb 14.1 oz (82.5 kg) Height:  5\' 4"  (162.6 cm)  BEHAVIORAL SYMPTOMS/MOOD NEUROLOGICAL BOWEL NUTRITION STATUS      Continent Diet (Diabetic)  AMBULATORY STATUS COMMUNICATION OF NEEDS Skin   Extensive Assist Verbally Normal                       Personal Care Assistance Level of Assistance  Bathing, Feeding, Dressing, Total care Bathing Assistance: Maximum assistance Feeding assistance: Independent Dressing Assistance: Maximum assistance Total Care Assistance: Maximum assistance   Functional Limitations Info  Sight, Hearing, Speech Sight Info: Adequate Hearing Info: Adequate Speech Info: Adequate    SPECIAL CARE FACTORS FREQUENCY  PT (By licensed PT)     PT Frequency: x5              Contractures      Additional Factors Info  Allergies   Allergies Info: Fish allergiesIodine, Percocet,latex,sheel fish allergy,amatriphaline,tape,wellbutrin, augmentin,coedine,cortisone,dyphenrohydramine,viocodin           Current Medications (07/07/2015):  This is the current hospital active medication list Current Facility-Administered Medications  Medication Dose Route Frequency Provider Last Rate Last Dose  . 0.9 %  sodium chloride infusion   Intravenous Continuous Idelle Crouch, MD 125 mL/hr at 07/07/15 0420    . acetaminophen (TYLENOL) tablet 650 mg  650 mg Oral Q6H PRN Idelle Crouch, MD       Or  . acetaminophen (TYLENOL) suppository 650 mg  650 mg Rectal Q6H PRN Idelle Crouch, MD      . albuterol (PROVENTIL) (2.5 MG/3ML) 0.083% nebulizer solution 2.5  mg  2.5 mg Nebulization Q6H PRN Idelle Crouch, MD      . allopurinol (ZYLOPRIM) tablet 100 mg  100 mg Oral Daily Idelle Crouch, MD   100 mg at 07/07/15 1017  . ARIPiprazole (ABILIFY) tablet 5 mg  5 mg Oral QHS Idelle Crouch, MD   5 mg at 07/06/15 2227  . atorvastatin (LIPITOR) tablet 10 mg  10 mg Oral QHS Idelle Crouch, MD   10 mg at 07/06/15 2232  .  bisacodyl (DULCOLAX) suppository 10 mg  10 mg Rectal Daily PRN Idelle Crouch, MD      . Ferrous Fumarate (HEMOCYTE - 106 mg FE) tablet 106 mg of iron  1 tablet Oral BID WC Idelle Crouch, MD   106 mg of iron at 07/07/15 0839  . gabapentin (NEURONTIN) capsule 300 mg  300 mg Oral QHS Idelle Crouch, MD   300 mg at 07/06/15 2225  . HYDROmorphone (DILAUDID) tablet 2 mg  2 mg Oral Q6H PRN Idelle Crouch, MD      . insulin aspart (novoLOG) injection 0-9 Units  0-9 Units Subcutaneous TID WC Idelle Crouch, MD   0 Units at 07/07/15 0745  . lactulose (CHRONULAC) 10 GM/15ML solution 20 g  20 g Oral BID PRN Idelle Crouch, MD   20 g at 07/07/15 0203  . lamoTRIgine (LAMICTAL) tablet 150 mg  150 mg Oral QHS Idelle Crouch, MD   150 mg at 07/06/15 2225  . levothyroxine (SYNTHROID, LEVOTHROID) tablet 88 mcg  88 mcg Oral QAC breakfast Idelle Crouch, MD   88 mcg at 07/07/15 0839  . magnesium oxide (MAG-OX) tablet 400 mg  400 mg Oral BID Idelle Crouch, MD   400 mg at 07/07/15 1012  . metoCLOPramide (REGLAN) tablet 10 mg  10 mg Oral QID PRN Idelle Crouch, MD      . metoprolol tartrate (LOPRESSOR) tablet 25 mg  25 mg Oral BID Idelle Crouch, MD   25 mg at 07/07/15 1013  . mometasone-formoterol (DULERA) 100-5 MCG/ACT inhaler 2 puff  2 puff Inhalation BID Idelle Crouch, MD   2 puff at 07/07/15 0840  . montelukast (SINGULAIR) tablet 10 mg  10 mg Oral QHS Idelle Crouch, MD   10 mg at 07/06/15 2225  . multivitamin with minerals tablet 1 tablet  1 tablet Oral Daily Idelle Crouch, MD   1 tablet at 07/07/15 1012  . ondansetron (ZOFRAN) tablet 4 mg  4 mg Oral Q6H PRN Idelle Crouch, MD       Or  . ondansetron [facility]) injection 4 mg  4 mg Intravenous Q6H PRN Idelle Crouch, MD      . pantoprazole (PROTONIX) EC tablet 40 mg  40 mg Oral Daily Idelle Crouch, MD   40 mg at 07/07/15 1012  . senna-docusate (Senokot-S) tablet 1 tablet  1 tablet Oral BID Idelle Crouch, MD   1 tablet at  07/07/15 1012  . sertraline (ZOLOFT) tablet 100 mg  100 mg Oral BID Idelle Crouch, MD   100 mg at 07/07/15 1013  . sodium chloride flush (NS) 0.9 % injection 3 mL  3 mL Intravenous Q12H Idelle Crouch, MD   3 mL at 07/07/15 1017     Discharge Medications: Please see discharge summary for a list of discharge medications.  Relevant Imaging Results:  Relevant Lab Results:   Additional Information SSN # [id number]  Pauletta Browns, Landy Mace  M, LCSW

## 2015-07-07 NOTE — Progress Notes (Signed)
LCSW met with patient who agreed to SNF- short term rehab and information Fl2 and CSW initial referral and therapy notes sent out via the [facility].

## 2015-07-07 NOTE — Clinical Social Work Note (Signed)
Clinical Social Work Assessment  Patient Details  Name: Samantha Mcmillan MRN: [id number] Date of Birth: 04-01-1955  Date of referral:  07/07/15               Reason for consult:  Facility Placement                Permission sought to share information with:  Family Supports, Customer service manager Permission granted to share information::  Yes, Verbal Permission Granted  Name::     Theresa Duty [phone]  Agency::  yes  Relationship::  yes  Contact Information:  yes  Housing/Transportation Living arrangements for the past 2 months:  Single Family Home Source of Information:  Patient Patient Interpreter Needed:  None Criminal Activity/Legal Involvement Pertinent to Current Situation/Hospitalization:  No - Comment as needed Significant Relationships:  Friend, Spouse Lives with:  Significant Other Do you feel safe going back to the place where you live?  Yes Need for family participation in patient care:  Yes (Comment)  Care giving concerns: Spouse feels patient needs to remain in hospital for a day or so and SNF-Rehab would be best   Facilities manager / plan: LCSW met with patient who agreed to short term rehab for her walking issues and understands she will have challenges if she returns home. [place] spouse will be there to support her when she is done with rehab. She has given verbal consent to contact family and facilities. Patient is oriented x 3 and uses a walker, she is an everyday 1/2 pack smoker and is diabetic but managed with medications. Patient is agreeable to PT and they recommend strongly she go to rehab center and have 5x week. She requires maximum assistance at this time and hopes to become independent again.  Employment status:  Disabled (Comment on whether or not currently receiving Disability), Retired Forensic scientist:   Veterinary surgeon) PT Recommendations:  Inpatient [facility] / Referral to community resources:  [place]  Patient/Family's Response to care:  Agreeable to go short term rehab SNF  Patient/Family's Understanding of and Emotional Response to Diagnosis, Current Treatment, and Prognosis: They have a good understanding now of her needs  Emotional Assessment Appearance:  Appears stated age Attitude/Demeanor/Rapport:   (Polite and oriented x3) Affect (typically observed):  Accepting, Adaptable, Hopeful Orientation:  Oriented to Self, Oriented to Place, Oriented to  Time, Oriented to Situation Alcohol / Substance use:  Tobacco Use Psych involvement (Current and /or in the community):  No (Comment)  Discharge Needs  Concerns to be addressed:  No discharge needs identified Readmission within the last 30 days:  No Current discharge risk:  None Barriers to Discharge:  Continued Medical Work up   Joana Reamer, LCSW 07/07/2015, 11:53 AM

## 2015-07-07 NOTE — Consult Note (Signed)
ORTHOPAEDIC CONSULTATION  REQUESTING PHYSICIAN: Bettey Costa, MD  Chief Complaint: Left medial tibial plateau fracture, nondisplaced  HPI: Samantha Mcmillan is a 60 y.o. female who is well known to me. I have seen her previously in the office for her left tibial plateau fracture. Patient is admitted for acute renal failure and lethargy. I saw the patient today in her ICU room. She is up out of bed to a chair eating lunch. Patient answers questions appropriately and follows commands. She has pain in the left knee. She states that she has been trying to remain compliant with not weightbearing on the left leg. She continues to use the hinged knee brace I have given to her in the office. She states that she sustained a secondary fall last week reinjuring her left knee. She denies any numbness tingling or loss of motor function in the left lower extremity.  Past Medical History  Diagnosis Date  . GERD (gastroesophageal reflux disease)   . Colon polyp   . Cirrhosis of liver ([place])   . Hypertension   . Depression   . COPD (chronic obstructive pulmonary disease) ([place])   . IBS (irritable bowel syndrome)   . On home oxygen therapy     "2L; 24/7" (05/04/2014)  . Family history of adverse reaction to anesthesia     "my sister doesn't wake up good when she's put deep to sleep"  . High cholesterol   . Heart murmur   . Asthma   . Pneumonia "several times"  . Chronic bronchitis ([place])     "get it pretty much q yr"   . Sleep apnea     "can't tolerate CPAP" (05/04/2014)  . Type II diabetes mellitus ([place])   . History of blood transfusion     "related to my anemia"  . Anemia   . Iron deficiency anemia   . Headache     "more than a couple times/wk" (05/04/2014)  . Arthritis     "some in my feet" (05/04/2014)  . Chronic lower back pain   . Anxiety   . Acute on chronic diastolic CHF (congestive heart failure) ([place]) 05/16/2014  . OCD (obsessive compulsive disorder)   . Panic attack   . PTSD  (post-traumatic stress disorder)   . Chronic respiratory failure ([place])   . Cirrhosis of liver ([place])   . IDA (iron deficiency anemia) 08/14/2014  . Personal history of tobacco use, presenting hazards to health 06/21/2015   Past Surgical History  Procedure Laterality Date  . Cesarean section  1979  . Cataract extraction w/ intraocular lens  implant, bilateral Bilateral 2005  . Back surgery    . Colonoscopy  2014  . Upper gastrointestinal endoscopy    . Posterior fusion cervical spine  2015    "rebuilt 3 of my neck vertebrae"  . Incision and drainage of wound Right 2009    "leg mauled  by dog"  . Tubal ligation  1979  . Dilation and curettage of uterus    . Peripheral vascular catheterization N/A 08/16/2014    Procedure: PICC Line Insertion;  Surgeon: Algernon Huxley, MD;  Location: [facility];  Service: Cardiovascular;  Laterality: N/A;   Social History   Social History  . Marital Status: Divorced    Spouse Name: N/A  . Number of Children: N/A  . Years of Education: N/A   Occupational History  . disabled    Social History Main Topics  . Smoking status: Current Every Day Smoker --  0.50 packs/day for 48 years    Types: Cigarettes  . Smokeless tobacco: Never Used  . Alcohol Use: No  . Drug Use: No  . Sexual Activity: Not Currently   Other Topics Concern  . None   Social History Narrative   Family History  Problem Relation Age of Onset  . Cancer Father     pancreatic  . Cancer Brother   . Breast cancer Maternal Aunt    Allergies  Allergen Reactions  . Fish Allergy Anaphylaxis and Swelling  . Iodine Shortness Of Breath  . Latex Anaphylaxis  . Percocet [Oxycodone-Acetaminophen] Shortness Of Breath  . Shellfish Allergy Anaphylaxis and Swelling  . Amitriptyline Other (See Comments)    Reaction:  Mental changes   . Tape Other (See Comments)    Pt states that it pulls her skin off.   . Wellbutrin [Bupropion] Other (See Comments)    Reaction:  Mental changes    . Augmentin [Amoxicillin-Pot Clavulanate] Rash and Other (See Comments)    Has patient had a PCN reaction causing immediate rash, facial/tongue/throat swelling, SOB or lightheadedness with hypotension: No Has patient had a PCN reaction causing severe rash involving mucus membranes or skin necrosis: No Has patient had a PCN reaction that required hospitalization No Has patient had a PCN reaction occurring within the last 10 years: No If all of the above answers are "NO", then may proceed with Cephalosporin use.  . Codeine Rash  . Cortisone Rash  . Diphenhydramine Rash  . Other Rash and Other (See Comments)    Pt states that she is allergic to Darvocet.   . Vicodin [Hydrocodone-Acetaminophen] Rash and Other (See Comments)    Reaction:  Dizziness     Prior to Admission medications   Medication Sig Start Date End Date Taking? Authorizing Provider  albuterol (PROVENTIL HFA;VENTOLIN HFA) 108 (90 BASE) MCG/ACT inhaler Inhale 2 puffs into the lungs every 4 (four) hours as needed for wheezing or shortness of breath.   Yes Historical Provider, MD  albuterol (PROVENTIL) (2.5 MG/3ML) 0.083% nebulizer solution Take 2.5 mg by nebulization every 6 (six) hours as needed for wheezing or shortness of breath.   Yes Historical Provider, MD  allopurinol (ZYLOPRIM) 100 MG tablet Take 100 mg by mouth 2 (two) times daily.    Yes Historical Provider, MD  ARIPiprazole (ABILIFY) 5 MG tablet Take 5 mg by mouth at bedtime.    Yes Historical Provider, MD  atorvastatin (LIPITOR) 10 MG tablet Take 10 mg by mouth at bedtime.    Yes Historical Provider, MD  budesonide-formoterol (SYMBICORT) 80-4.5 MCG/ACT inhaler Inhale 2 puffs into the lungs 2 (two) times daily.    Yes Historical Provider, MD  bumetanide (BUMEX) 1 MG tablet Take 1 mg by mouth 2 (two) times daily. 07/04/15  Yes Historical Provider, MD  Buprenorphine (BUTRANS) 15 MCG/HR PTWK Place 15 mcg onto the skin once a week. Pt applies on Sunday.   Yes Historical  Provider, MD  cyclobenzaprine (FLEXERIL) 10 MG tablet Take 10 mg by mouth 3 (three) times daily as needed for muscle spasms.    Yes Historical Provider, MD  ferrous fumarate-iron polysaccharide complex (TANDEM) 162-115.2 MG CAPS Take 1 capsule by mouth 2 (two) times daily after a meal. 10/10/14  Yes Evlyn Kanner, NP  fluticasone (FLONASE) 50 MCG/ACT nasal spray Place 1-2 sprays into both nostrils daily as needed for rhinitis.   Yes Historical Provider, MD  gabapentin (NEURONTIN) 300 MG capsule Take 300 mg by mouth at bedtime.  Yes Historical Provider, MD  HYDROmorphone (DILAUDID) 2 MG tablet Take 2 mg by mouth every 6 (six) hours as needed for severe pain.    Yes Historical Provider, MD  lactulose (CHRONULAC) 10 GM/15ML solution Take 20 g by mouth 2 (two) times daily as needed for mild constipation.   Yes Historical Provider, MD  lamoTRIgine (LAMICTAL) 150 MG tablet Take 150 mg by mouth at bedtime.    Yes Historical Provider, MD  levothyroxine (SYNTHROID, LEVOTHROID) 88 MCG tablet Take 88 mcg by mouth daily before breakfast.    Yes Historical Provider, MD  lisinopril (ZESTRIL) 10 MG tablet Take 1 tablet (10 mg total) by mouth daily. 04/17/15  Yes Epifanio Lesches, MD  magnesium oxide (MAG-OX) 400 MG tablet Take 400 mg by mouth 2 (two) times daily.   Yes Historical Provider, MD  metoCLOPramide (REGLAN) 10 MG tablet Take 10 mg by mouth 4 (four) times daily as needed for nausea or vomiting.    Yes Historical Provider, MD  metoprolol tartrate (LOPRESSOR) 25 MG tablet Take 25 mg by mouth 2 (two) times daily.   Yes Historical Provider, MD  montelukast (SINGULAIR) 10 MG tablet Take 10 mg by mouth at bedtime.    Yes Historical Provider, MD  morphine (MSIR) 30 MG tablet Take 1 tablet (30 mg total) by mouth every 4 (four) hours as needed for severe pain (Breakthrough pain). 06/25/15  Yes Victorino Dike, FNP  Multiple Vitamin (MULTIVITAMIN WITH MINERALS) TABS tablet Take 1 tablet by mouth daily. 05/18/14   Yes Christina P Rama, MD  ondansetron (ZOFRAN) 4 MG tablet Take 4 mg by mouth every 8 (eight) hours as needed for nausea or vomiting.    Yes Historical Provider, MD  pantoprazole (PROTONIX) 40 MG tablet Take 40 mg by mouth daily.   Yes Historical Provider, MD  senna-docusate (SENOKOT-S) 8.6-50 MG tablet Take 1 tablet by mouth 2 (two) times daily.   Yes Historical Provider, MD  sertraline (ZOLOFT) 100 MG tablet Take 100 mg by mouth 2 (two) times daily.    Yes Historical Provider, MD  sulfamethoxazole-trimethoprim (BACTRIM DS,SEPTRA DS) 800-160 MG tablet Take 1 tablet by mouth 2 (two) times daily. X 10 days. 06/28/15  Yes Historical Provider, MD  guaiFENesin (MUCINEX) 600 MG 12 hr tablet Take 600 mg by mouth 2 (two) times daily as needed for cough or to loosen phlegm.     Historical Provider, MD   Ct Head Wo Contrast  07/06/2015  CLINICAL DATA:  Pt here with increased altered mental status over the last week per family member. Family states she is coming off of pain medication from a knee injury. EXAM: CT HEAD WITHOUT CONTRAST TECHNIQUE: Contiguous axial images were obtained from the base of the skull through the vertex without intravenous contrast. COMPARISON:  04/15/2015 FINDINGS: There is central and cortical atrophy. Periventricular white matter changes are consistent with small vessel disease. There is no intra or extra-axial fluid collection or mass lesion. The basilar cisterns and ventricles have a normal appearance. There is no CT evidence for acute infarction or hemorrhage. Bone windows show no acute fracture. Residual mild left frontal scalp edema. Paranasal and mastoid air cells are normally aerated. Study quality is degraded by patient motion artifact. IMPRESSION: 1. Atrophy and small vessel disease. 2.  No evidence for acute intracranial abnormality. Electronically Signed   By: Nolon Nations M.D.   On: 07/06/2015 16:35   Dg Chest Portable 1 View  07/06/2015  CLINICAL DATA:  Confusion.  Knee  fracture  2 weeks ago. EXAM: PORTABLE CHEST 1 VIEW COMPARISON:  04/15/2015 FINDINGS: Patient is rotated to the right. Lungs are adequately inflated without focal consolidation or effusion. Cardiomediastinal silhouette and remainder of the exam is unchanged. IMPRESSION: No active disease. Electronically Signed   By: Marin Olp M.D.   On: 07/06/2015 16:09   Dg Knee Left Port  07/07/2015  CLINICAL DATA:  Left knee injury, pain for 2 weeks EXAM: PORTABLE LEFT KNEE - 1-2 VIEW COMPARISON:  06/25/2015 FINDINGS: Five views of the left knee submitted. Again noted nondisplaced fracture in proximal medial tibial metaphysis. There is nondisplaced fracture in proximal left fibula metaphysis. Moderate joint effusion. Mild narrowing of medial joint compartment. IMPRESSION: Again noted nondisplaced fracture in proximal medial tibial metaphysis. There is nondisplaced fracture in proximal left fibula metaphysis. Moderate joint effusion. Mild narrowing of medial joint compartment. Electronically Signed   By: Lahoma Crocker M.D.   On: 07/07/2015 11:38    Positive ROS: All other systems have been reviewed and were otherwise negative with the exception of those mentioned in the HPI and as above.  Physical Exam: General: Alert, no acute distress, Asian following commands.  MUSCULOSKELETAL: Left lower extremity: Patient's skin remains intact. There is no erythema or ecchymosis and only a trace effusion. Her knee is fully extended. She continues to have tenderness over the medial condyle of the tibia. There is no obvious deformity. Distally she has palpable pedal pulses and intact sensation light touch. There is no calf tenderness. She has intact motor function throughout the left lower extremity.  Assessment: Nondisplaced medial tibial plateau fracture  Plan: Patient will continue using her hinged knee brace. She has been evaluated by physical therapy was recommended skilled nursing facility. I agree with this assessment as  I have concerns about the patient's safety at home. She is a significant fall risk in my estimation. I am ordering a rolling walker for short distances while at the skilled nursing facility. I am also ordering a regular manual wheelchair with a left leg supports for traveling longer distances. Patient may participate with physical therapy for gentle knee range of motion exercises and nonweightbearing lower extremities strengthening. She may be toe-touch weightbearing on the left lower extremity while using her walker. She should not stand or ambulate without her hinged knee brace in place. Patient has an appointment to follow up with me on May 19.    Thornton Park, MD    07/07/2015 1:29 PM

## 2015-07-07 NOTE — Progress Notes (Signed)
[facility] [place] NAME: Karly Reuss    MR#:  [id number]  DATE OF BIRTH:  [birth year]  SUBJECTIVE:  Patient here with encephalopthy  REVIEW OF SYSTEMS:    Review of Systems  Constitutional: Negative for fever, chills and malaise/fatigue.  HENT: Negative for ear discharge, ear pain, hearing loss, nosebleeds and sore throat.   Eyes: Negative for blurred vision and pain.  Respiratory: Negative for cough, hemoptysis, shortness of breath and wheezing.   Cardiovascular: Negative for chest pain, palpitations and leg swelling.  Gastrointestinal: Negative for nausea, vomiting, abdominal pain, diarrhea and blood in stool.  Genitourinary: Negative for dysuria.  Musculoskeletal: Positive for back pain, joint pain and falls.  Neurological: Negative for dizziness, tremors, speech change, focal weakness, seizures and headaches.  Endo/Heme/Allergies: Does not bruise/bleed easily.  Psychiatric/Behavioral: Negative for depression, suicidal ideas and hallucinations.    Tolerating Diet: yes      DRUG ALLERGIES:   Allergies  Allergen Reactions  . Fish Allergy Anaphylaxis and Swelling  . Iodine Shortness Of Breath  . Latex Anaphylaxis  . Percocet [Oxycodone-Acetaminophen] Shortness Of Breath  . Shellfish Allergy Anaphylaxis and Swelling  . Amitriptyline Other (See Comments)    Reaction:  Mental changes   . Tape Other (See Comments)    Pt states that it pulls her skin off.   . Wellbutrin [Bupropion] Other (See Comments)    Reaction:  Mental changes   . Augmentin [Amoxicillin-Pot Clavulanate] Rash and Other (See Comments)    Has patient had a PCN reaction causing immediate rash, facial/tongue/throat swelling, SOB or lightheadedness with hypotension: No Has patient had a PCN reaction causing severe rash involving mucus membranes or skin necrosis: No Has patient had a PCN reaction that required hospitalization No Has patient had a PCN reaction  occurring within the last 10 years: No If all of the above answers are "NO", then may proceed with Cephalosporin use.  . Codeine Rash  . Cortisone Rash  . Diphenhydramine Rash  . Other Rash and Other (See Comments)    Pt states that she is allergic to Darvocet.   . Vicodin [Hydrocodone-Acetaminophen] Rash and Other (See Comments)    Reaction:  Dizziness      VITALS:  Blood pressure 133/66, pulse 74, temperature 97.9 F (36.6 C), temperature source Oral, resp. rate 17, height 5\' 4"  (1.626 m), weight 82.5 kg (181 lb 14.1 oz), SpO2 100 %.  PHYSICAL EXAMINATION:   Physical Exam  Constitutional: She is oriented to person, place, and time and well-developed, well-nourished, and in no distress. No distress.  HENT:  Head: Normocephalic.  Eyes: No scleral icterus.  Neck: Normal range of motion. Neck supple. No JVD present. No tracheal deviation present.  Cardiovascular: Normal rate, regular rhythm and normal heart sounds.  Exam reveals no gallop and no friction rub.   No murmur heard. Pulmonary/Chest: Effort normal and breath sounds normal. No respiratory distress. She has no wheezes. She has no rales. She exhibits no tenderness.  Abdominal: Soft. Bowel sounds are normal. She exhibits no distension and no mass. There is no tenderness. There is no rebound and no guarding.  Musculoskeletal: Normal range of motion. She exhibits no edema.  Left knee brace  Neurological: She is alert and oriented to person, place, and time.  Skin: Skin is warm. No rash noted. No erythema.  Psychiatric: Affect and judgment normal.      LABORATORY PANEL:   CBC  Recent Labs Lab 07/07/15 0537  WBC  4.1  HGB 10.7*  HCT 31.2*  PLT 106*   ------------------------------------------------------------------------------------------------------------------  Chemistries   Recent Labs Lab 07/07/15 0537 07/07/15 1009  NA 134* 132*  K 5.6* 6.6*  CL 105 105  CO2 24 21*  GLUCOSE 118* 222*  BUN 43* 38*   CREATININE 1.60* 1.53*  CALCIUM 8.3* 8.0*  AST 29  --   ALT 16  --   ALKPHOS 119  --   BILITOT 0.5  --    ------------------------------------------------------------------------------------------------------------------  Cardiac Enzymes  Recent Labs Lab 07/06/15 1432  TROPONINI <0.03   ------------------------------------------------------------------------------------------------------------------  RADIOLOGY:  Ct Head Wo Contrast  07/06/2015  CLINICAL DATA:  Pt here with increased altered mental status over the last week per family member. Family states she is coming off of pain medication from a knee injury. EXAM: CT HEAD WITHOUT CONTRAST TECHNIQUE: Contiguous axial images were obtained from the base of the skull through the vertex without intravenous contrast. COMPARISON:  04/15/2015 FINDINGS: There is central and cortical atrophy. Periventricular white matter changes are consistent with small vessel disease. There is no intra or extra-axial fluid collection or mass lesion. The basilar cisterns and ventricles have a normal appearance. There is no CT evidence for acute infarction or hemorrhage. Bone windows show no acute fracture. Residual mild left frontal scalp edema. Paranasal and mastoid air cells are normally aerated. Study quality is degraded by patient motion artifact. IMPRESSION: 1. Atrophy and small vessel disease. 2.  No evidence for acute intracranial abnormality. Electronically Signed   By: Nolon Nations M.D.   On: 07/06/2015 16:35   Dg Chest Portable 1 View  07/06/2015  CLINICAL DATA:  Confusion.  Knee fracture 2 weeks ago. EXAM: PORTABLE CHEST 1 VIEW COMPARISON:  04/15/2015 FINDINGS: Patient is rotated to the right. Lungs are adequately inflated without focal consolidation or effusion. Cardiomediastinal silhouette and remainder of the exam is unchanged. IMPRESSION: No active disease. Electronically Signed   By: Marin Olp M.D.   On: 07/06/2015 16:09   Dg Knee Left  Port  07/07/2015  CLINICAL DATA:  Left knee injury, pain for 2 weeks EXAM: PORTABLE LEFT KNEE - 1-2 VIEW COMPARISON:  06/25/2015 FINDINGS: Five views of the left knee submitted. Again noted nondisplaced fracture in proximal medial tibial metaphysis. There is nondisplaced fracture in proximal left fibula metaphysis. Moderate joint effusion. Mild narrowing of medial joint compartment. IMPRESSION: Again noted nondisplaced fracture in proximal medial tibial metaphysis. There is nondisplaced fracture in proximal left fibula metaphysis. Moderate joint effusion. Mild narrowing of medial joint compartment. Electronically Signed   By: Lahoma Crocker M.D.   On: 07/07/2015 11:38     ASSESSMENT AND PLAN:   60 year old female with liver cirrhosis and chronic pain syndrome who presented with encephalopathy and hyperkalemia.  1. Acute metabolic encephalopathy: This is due to overmedication on narcotics. She is back to her baseline.  2. Hyperkalemia was treated with Kayexalate, D50 and insulin. Potassium has improved. Lisinopril has been discontinued for now. She has liver cirrhosis and is on Aldactone. Aldactone dose has decreased. She needs close monitoring of BMP.   3. Acute kidney injury: This is due to poor by mouth intake. This is improved with IV fluids.  4. Hyponatremia: This is due to poor by mouth intake and also has improved.  5. Chronic pain syndrome: She will follow-up with her pain physician. Continue pain patch as ordered by her pain clinician. All other narcotics have been discontinued which were not prescribed by her pain clinician.  6. Right knee  pain/injury with nondisplaced fracture involving the medial tibial plateau:As per Dr Octavio Manns: Patient may participate with physical therapy for gentle knee range of motion exercises and nonweightbearing lower extremities strengthening. She may be toe-touch weightbearing on the left lower extremity while using her walker. She should not stand or  ambulate without her hinged knee brace in place. Patient has an appointment to follow up with him on May 19.   7. Liver cirrhosis: Continue low-dose Aldactone and lactulose.       Management plans discussed with the patient and she is in agreement.  CODE STATUS: FULL  TOTAL TIME TAKING CARE OF THIS PATIENT: 30 minutes.     POSSIBLE D/C 2-3 days, DEPENDING ON CLINICAL CONDITION.   Noemie Devivo M.D on 07/07/2015 at 3:38 PM  Between 7am to 6pm - Pager - [phone] After 6pm go to [email or web address] - password [email or web address] [facility]  Office  [phone]  CC: Primary care physician; Casilda Carls, MD  Note: This dictation was prepared with Dragon dictation along with smaller phrase technology. Any transcriptional errors that result from this process are unintentional.

## 2015-07-07 NOTE — Progress Notes (Addendum)
Had notified Dr. Benjie Karvonen about patient's potassium 6.6- this was taken before I was able to administer the kayexelate, insulin and Dextrose.  Per Dr. Benjie Karvonen- patient will be seen by social worker about either going home with home health or transfer to the floor with telemetry and be discharged to a skilled nursing facility.  Also made Dr. Benjie Karvonen aware that patient was unable to walk when working with physical therapy this morning so a SNF would be a great option.

## 2015-07-07 NOTE — Discharge Summary (Addendum)
[facility] NAME: Samantha Mcmillan    MR#:  [id number]  DATE OF BIRTH:  08/23/1955  DATE OF ADMISSION:  07/06/2015 ADMITTING PHYSICIAN: Idelle Crouch, MD  DATE OF DISCHARGE: 07/09/2015  PRIMARY CARE PHYSICIAN: Casilda Carls, MD    ADMISSION DIAGNOSIS:  Confusion  DISCHARGE DIAGNOSIS:  Active Problems:   Hyponatremia   AKI   Hyperkalemia   Dehydration   SECONDARY DIAGNOSIS:   Past Medical History  Diagnosis Date  . GERD (gastroesophageal reflux disease)   . Colon polyp   . Cirrhosis of liver ([place])   . Hypertension   . Depression   . COPD (chronic obstructive pulmonary disease) ([place])   . IBS (irritable bowel syndrome)   . On home oxygen therapy     "2L; 24/7" (05/04/2014)  . Family history of adverse reaction to anesthesia     "my sister doesn't wake up good when she's put deep to sleep"  . High cholesterol   . Heart murmur   . Asthma   . Pneumonia "several times"  . Chronic bronchitis ([place])     "get it pretty much q yr"   . Sleep apnea     "can't tolerate CPAP" (05/04/2014)  . Type II diabetes mellitus ([place])   . History of blood transfusion     "related to my anemia"  . Anemia   . Iron deficiency anemia   . Headache     "more than a couple times/wk" (05/04/2014)  . Arthritis     "some in my feet" (05/04/2014)  . Chronic lower back pain   . Anxiety   . Acute on chronic diastolic CHF (congestive heart failure) ([place]) 05/16/2014  . OCD (obsessive compulsive disorder)   . Panic attack   . PTSD (post-traumatic stress disorder)   . Chronic respiratory failure ([place])   . Cirrhosis of liver ([place])   . IDA (iron deficiency anemia) 08/14/2014  . Personal history of tobacco use, presenting hazards to health 06/21/2015    HOSPITAL COURSE:   60 year old female with liver cirrhosis and chronic pain syndrome who presented with encephalopathy and hyperkalemia.  1. Acute metabolic encephalopathy: This was due to  overmedication on narcotics. She is back to her baseline.  2. Hyperkalemia was treated with Kayexalate, D50 and insulin. Potassium has improved. Lisinopril and Aldactone have been discontinued for now.I suspect BACTRIm also had a large role in hyperkalemia She will follow up with Dr Abigail Butts and perhaps these medications can soon be restarted with CLOSE monitoring of K.  3. Acute kidney injury: This is due to poor by mouth intake. This has improved with IV fluids.  4. Hyponatremia: This is due to poor by mouth intake and also has improved.She will continue on BUMEX but need BMP checked on Friday.  5. Chronic pain syndrome: She will follow-up with her pain physician. Continue pain patch as ordered by her pain clinician.  All other narcotics have been discontinued which were not prescribed by her pain clinician.  6. Right knee pain/injury with nondisplaced fracture involving the medial tibial plateau.: She has follow-up with orthopedic surgery.  7. Liver cirrhosis: Continue lactulose.  DISCHARGE CONDITIONS AND DIET:   Stable  Regular diet  CONSULTS OBTAINED:  Treatment Team:  Murlean Iba, MD Corky Mull, MD  DRUG ALLERGIES:   Allergies  Allergen Reactions  . Fish Allergy Anaphylaxis and Swelling  . Iodine Shortness Of Breath  . Latex Anaphylaxis  . Percocet [Oxycodone-Acetaminophen] Shortness Of  Breath  . Shellfish Allergy Anaphylaxis and Swelling  . Amitriptyline Other (See Comments)    Reaction:  Mental changes   . Tape Other (See Comments)    Pt states that it pulls her skin off.   . Wellbutrin [Bupropion] Other (See Comments)    Reaction:  Mental changes   . Augmentin [Amoxicillin-Pot Clavulanate] Rash and Other (See Comments)    Has patient had a PCN reaction causing immediate rash, facial/tongue/throat swelling, SOB or lightheadedness with hypotension: No Has patient had a PCN reaction causing severe rash involving mucus membranes or skin necrosis: No Has patient  had a PCN reaction that required hospitalization No Has patient had a PCN reaction occurring within the last 10 years: No If all of the above answers are "NO", then may proceed with Cephalosporin use.  . Codeine Rash  . Cortisone Rash  . Diphenhydramine Rash  . Other Rash and Other (See Comments)    Pt states that she is allergic to Darvocet.   . Vicodin [Hydrocodone-Acetaminophen] Rash and Other (See Comments)    Reaction:  Dizziness      DISCHARGE MEDICATIONS:   Current Discharge Medication List    CONTINUE these medications which have CHANGED   Details  Buprenorphine (BUTRANS) 15 MCG/HR PTWK Place 20 mcg onto the skin once a week. Pt applies on Sunday. Qty: 1 patch, Refills: 0    HYDROmorphone (DILAUDID) 2 MG tablet Take 1 tablet (2 mg total) by mouth every 6 (six) hours as needed for severe pain. Qty: 30 tablet, Refills: 0    magnesium oxide (MAG-OX) 400 MG tablet Take 1 tablet (400 mg total) by mouth daily. Qty: 30 tablet, Refills: 0      CONTINUE these medications which have NOT CHANGED   Details  albuterol (PROVENTIL HFA;VENTOLIN HFA) 108 (90 BASE) MCG/ACT inhaler Inhale 2 puffs into the lungs every 4 (four) hours as needed for wheezing or shortness of breath.    albuterol (PROVENTIL) (2.5 MG/3ML) 0.083% nebulizer solution Take 2.5 mg by nebulization every 6 (six) hours as needed for wheezing or shortness of breath.    allopurinol (ZYLOPRIM) 100 MG tablet Take 100 mg by mouth 2 (two) times daily.     ARIPiprazole (ABILIFY) 5 MG tablet Take 5 mg by mouth at bedtime.     atorvastatin (LIPITOR) 10 MG tablet Take 10 mg by mouth at bedtime.     budesonide-formoterol (SYMBICORT) 80-4.5 MCG/ACT inhaler Inhale 2 puffs into the lungs 2 (two) times daily.     bumetanide (BUMEX) 1 MG tablet Take 1 mg by mouth 2 (two) times daily.    cyclobenzaprine (FLEXERIL) 10 MG tablet Take 10 mg by mouth 3 (three) times daily as needed for muscle spasms.     ferrous fumarate-iron  polysaccharide complex (TANDEM) 162-115.2 MG CAPS Take 1 capsule by mouth 2 (two) times daily after a meal. Qty: 60 capsule, Refills: 5    fluticasone (FLONASE) 50 MCG/ACT nasal spray Place 1-2 sprays into both nostrils daily as needed for rhinitis.    gabapentin (NEURONTIN) 300 MG capsule Take 300 mg by mouth at bedtime.     lactulose (CHRONULAC) 10 GM/15ML solution Take 20 g by mouth 2 (two) times daily as needed for mild constipation.    lamoTRIgine (LAMICTAL) 150 MG tablet Take 150 mg by mouth at bedtime.     levothyroxine (SYNTHROID, LEVOTHROID) 88 MCG tablet Take 88 mcg by mouth daily before breakfast.     metoCLOPramide (REGLAN) 10 MG tablet Take 10 mg by  mouth 4 (four) times daily as needed for nausea or vomiting.     metoprolol tartrate (LOPRESSOR) 25 MG tablet Take 25 mg by mouth 2 (two) times daily.    montelukast (SINGULAIR) 10 MG tablet Take 10 mg by mouth at bedtime.     Multiple Vitamin (MULTIVITAMIN WITH MINERALS) TABS tablet Take 1 tablet by mouth daily.    ondansetron (ZOFRAN) 4 MG tablet Take 4 mg by mouth every 8 (eight) hours as needed for nausea or vomiting.     pantoprazole (PROTONIX) 40 MG tablet Take 40 mg by mouth daily.    senna-docusate (SENOKOT-S) 8.6-50 MG tablet Take 1 tablet by mouth 2 (two) times daily.    sertraline (ZOLOFT) 100 MG tablet Take 100 mg by mouth 2 (two) times daily.       STOP taking these medications     lisinopril (ZESTRIL) 10 MG tablet      morphine (MSIR) 30 MG tablet      sulfamethoxazole-trimethoprim (BACTRIM DS,SEPTRA DS) 800-160 MG tablet      spironolactone (ALDACTONE) 25 MG tablet      guaiFENesin (MUCINEX) 600 MG 12 hr tablet               Today   CHIEF COMPLAINT:   Pain better controlled but worked with PT this am so had more pain   VITAL SIGNS:  Blood pressure 157/69, pulse 71, temperature 98.4 F (36.9 C), temperature source Oral, resp. rate 20, height 5\' 4"  (1.626 m), weight 81.557 kg (179 lb  12.8 oz), SpO2 96 %.   REVIEW OF SYSTEMS:  Review of Systems  Constitutional: Negative for fever, chills and malaise/fatigue.  HENT: Negative for ear discharge, ear pain, hearing loss, nosebleeds and sore throat.   Eyes: Negative for blurred vision and pain.  Respiratory: Negative for cough, hemoptysis, shortness of breath and wheezing.   Cardiovascular: Negative for chest pain, palpitations and leg swelling.  Gastrointestinal: Negative for nausea, vomiting, abdominal pain, diarrhea and blood in stool.  Genitourinary: Negative for dysuria.  Musculoskeletal: Positive for joint pain.  Neurological: Negative for dizziness, tremors, speech change, focal weakness, seizures and headaches.  Endo/Heme/Allergies: Does not bruise/bleed easily.  Psychiatric/Behavioral: Negative for depression, suicidal ideas and hallucinations.     PHYSICAL EXAMINATION:  GENERAL:  60 y.o.-year-old patient lying in the bed with no acute distress.  NECK:  Supple, no jugular venous distention. No thyroid enlargement, no tenderness.  LUNGS: Normal breath sounds bilaterally, no wheezing, rales,rhonchi  No use of accessory muscles of respiration.  CARDIOVASCULAR: S1, S2 normal. No murmurs, rubs, or gallops.  ABDOMEN: Soft, non-tender, non-distended. Bowel sounds present. No organomegaly or mass.  EXTREMITIES: No pedal edema, cyanosis, or clubbing. She has brace on left knee PSYCHIATRIC: The patient is alert and oriented x 3.  SKIN: No obvious rash, lesion, or ulcer.   DATA REVIEW:   CBC  Recent Labs Lab 07/07/15 0537  WBC 4.1  HGB 10.7*  HCT 31.2*  PLT 106*    Chemistries   Recent Labs Lab 07/07/15 0537  07/09/15 0536  NA 134*  < > 133*  K 5.6*  < > 5.0  CL 105  < > 106  CO2 24  < > 23  GLUCOSE 118*  < > 93  BUN 43*  < > 13  CREATININE 1.60*  < > 0.84  CALCIUM 8.3*  < > 8.4*  MG  --   --  1.7  AST 29  --   --   ALT  16  --   --   ALKPHOS 119  --   --   BILITOT 0.5  --   --   < > = values  in this interval not displayed.  Cardiac Enzymes  Recent Labs Lab 07/06/15 1432  TROPONINI <0.03    Microbiology Results  @MICRORSLT48 @  RADIOLOGY:  No results found.    Management plans discussed with the patient and she is in agreement. Stable for discharge home with [facility]  Patient should follow up with PCP in 2 days for BMP  CODE STATUS:     Code Status Orders        Start     Ordered   07/06/15 1957  Full code   Continuous     07/06/15 1956    Code Status History    Date Active Date Inactive Code Status Order ID Comments User Context   04/16/2015  6:01 AM 04/17/2015  5:27 PM Full Code [id number]  Saundra Shelling, MD Inpatient   09/06/2014  4:01 PM 09/08/2014  2:39 PM Full Code [id number]  Demetrios Loll, MD Inpatient   08/26/2014 10:01 PM 08/29/2014  3:33 PM Full Code [id number]  Lytle Butte, MD ED   07/07/2014  1:38 AM 07/09/2014  3:57 PM Full Code [id number]  Juluis Mire, MD Inpatient   05/15/2014 10:45 PM 05/18/2014  6:07 PM Full Code [id number]  Allyne Gee, MD Inpatient   05/04/2014  4:03 PM 05/09/2014  4:55 PM Full Code [id number]  Rigoberto Noel, MD ED      TOTAL TIME TAKING CARE OF THIS PATIENT: 36 minutes.   She needs BMP on  Thursday.   Note: This dictation was prepared with Dragon dictation along with smaller phrase technology. Any transcriptional errors that result from this process are unintentional.  Nakhia Levitan M.D on 07/09/2015 at 9:44 AM  Between 7am to 6pm - Pager - [phone] After 6pm go to [email or web address] - password [email or web address] [facility]  Office  [phone]  CC: Primary care physician; Casilda Carls, MD

## 2015-07-07 NOTE — Plan of Care (Signed)
Problem: Acute Rehab PT Goals(only PT should resolve) Goal: Pt Will Go Supine/Side To Sit Pt will demonstrate ModI bed mobility supine to sitting edge-of-bed to return to PLOF and to decrease caregiver burden.     Goal: Patient Will Transfer Sit To/From Stand Pt will transfer sit to/from-stand with RW and LLE-NWB with supervision without loss-of-balance to demonstrate good safety awareness for independent mobility in home.      Goal: Pt Will Ambulate Pt will ambulate with RW at Supervision using a RLE Hop-to pattern and LLE-NWB for a distance greater than 47ft to demonstrate the ability to perform safe household distance ambulation at discharge.  Pt will ascend/descend 3 stairs with LRAD,LLE-NWB, and 1 HR at Supervision to demonstrate safe entry/exit of home.

## 2015-07-07 NOTE — Evaluation (Addendum)
Physical Therapy Evaluation Patient Details Name: Samantha Mcmillan MRN: [id number] DOB: [birth year] Today's Date: 07/07/2015   History of Present Illness  Samantha Mcmillan is a 60yo white female who comes to [facility] after 3d of AMS and weakness, noted to be hyponatremia and hyperkalemic. The following history is obtained from the patient but not verified from the EMR: pt sustained a fall at home ~3WA, seen by Dr. Mack Guise, noted to have a LLE fracture, which was planned to be treated conservatively. Pt was given a hinged knee brace and asked to be NWB and on 'bedrest'. The patient had recently just finished HHPT ~4WA and was tolerating 25-33min community distance AMB c RW and decreased gait speed.  The patient sustained another fall last week, this time falling onto her left knee, after which she has had severe pain in the left knee mostly with active knee extention and positively correlated with contractile force. The patient since has become very weak, unable to perform bed mobility, tranfers, or AMB without heavy caregiver assistance.   Clinical Impression  At evaluation, pt is received up in chair upon entry, lab present drawing blood. The pt is awake and agreeable to participate. The pt is alert and oriented x3, pleasant, conversational, and following simple and multi-step commands consistently. Pt reports 4-5 falls in the last 6 months. Pt grip strength is strong and symmetrical; global strength as screened during functional mobility assessment presents with moderate impairment, the pt now requiring minA physical assistance for transfers, whereas the patient performed these indep at baseline 4WA. The patient is too weak to maintain LLE-NWB as directed by ortho 3WA while performing any ambulation. She also reports she has been unable to perform bed mobility without physical assistance in the last week, due to deconditioning from NWB and 'bed rest.' Left knee is not inspected in detail as othro consult is  pending. Imaging note from 2WA describes a potential LLE medial tibial plateau fracture.    Patient presenting with impairment of strength, range of motion, balance, and activity tolerance, limiting ability to perform ADL and mobility tasks at  baseline level of function. Patient will benefit from skilled intervention to address the above impairments and limitations, in order to restore to prior level of function, improve patient safety upon discharge, and to decrease falls risk. In current state, the patient is too weak to perform necessary mobility to enter home, exit home, go to FU medical appointments, or perform pivot transfers without physical assistance. She has made good progress with PT in the past, and is a good candidate for STR to improve current deconditioned state and strengthening so that she can maintain her LLE-NWB while her fracture heals.       Follow Up Recommendations SNF (HHPT if pt does not DC to STR for some reason. )    Equipment Recommendations   (PT recommending WC and HHPT if pt does not DC to STR)    Recommendations for Other Services       Precautions / Restrictions Restrictions Weight Bearing Restrictions: Yes Other Position/Activity Restrictions: Per the pt: LLE NWB given 3WA.       Mobility  Bed Mobility               General bed mobility comments: Received in chair.   Transfers Overall transfer level: Needs assistance Equipment used: Rolling walker (2 wheeled) Transfers: Sit to/from Stand Sit to Stand: Supervision         General transfer comment: Pt unable to come to  standing while maintaining LLE NWB. Once standing, she is unable to maintain LLE NWB but for brief intervals, limited by weakness in BUE.   Ambulation/Gait Ambulation/Gait assistance:  (Attempted gait trial with R hop-to gait with RW and LLE NWB. Pt unable to maintain NWB, hence trial ended. )              Stairs            Wheelchair Mobility    Modified  Rankin (Stroke Patients Only)       Balance Overall balance assessment: Modified Independent;History of Falls (Estimates 4-5 falls in the last 6 months, unable to stay why, but denies LOC, dizziness, syncope.Marland Kitchen)                                           Pertinent Vitals/Pain Pain Assessment: No/denies pain (Reports she is always in pain, but nothing new)    Home Living Family/patient expects to be discharged to:: Private residence Living Arrangements:  (exhusband (caregiver) ) Available Help at Discharge: Family;Available 24 hours/day Type of Home: House Home Access: Stairs to enter Entrance Stairs-Rails: Right Entrance Stairs-Number of Steps: 3 Home Layout: One level Home Equipment: Walker - 2 wheels;Walker - 4 wheels;Shower seat - built in (does not have WC, but had previously borowed from a friend. )      Prior Function Level of Independence: Independent with assistive device(s)   Gait / Transfers Assistance Needed: 4WA, performing community distance ambulation at limited speeds with RW.            Hand Dominance   Dominant Hand: Right    Extremity/Trunk Assessment   Upper Extremity Assessment: Generalized weakness           Lower Extremity Assessment: Generalized weakness         Communication   Communication: No difficulties  Cognition Arousal/Alertness: Awake/alert Behavior During Therapy: WFL for tasks assessed/performed Overall Cognitive Status: Within Functional Limits for tasks assessed                      General Comments      Exercises        Assessment/Plan    PT Assessment Patient needs continued PT services  PT Diagnosis Difficulty walking;Generalized weakness   PT Problem List Decreased strength;Decreased range of motion;Decreased activity tolerance;Decreased balance;Decreased mobility  PT Treatment Interventions DME instruction;Gait training;Functional mobility training;Stair training;Therapeutic  exercise;Therapeutic activities   PT Goals (Current goals can be found in the Care Plan section) Acute Rehab PT Goals Patient Stated Goal: Regain strength and inep PT Goal Formulation: With patient Time For Goal Achievement: 07/21/15 Potential to Achieve Goals: Good    Frequency Min 2X/week   Barriers to discharge Inaccessible home environment;Decreased caregiver support Unable to access home, unable to maintain LLE-NWB for transfers and household distance gait.     Co-evaluation               End of Session Equipment Utilized During Treatment: Gait belt;Other (comment) Activity Tolerance: Patient tolerated treatment well;Patient limited by fatigue Patient left: in chair;with call bell/phone within reach Nurse Communication: Mobility status;Other (comment);Weight bearing status         Time: [id number] PT Time Calculation (min) (ACUTE ONLY): 36 min   Charges:   PT Evaluation $PT Eval Moderate Complexity: 1 Procedure     PT G Codes:  11:03 AM, 07/07/2015 Etta Grandchild, PT, DPT PRN Physical Therapist - [place] License # [id number] [phone] (mobile)

## 2015-07-08 LAB — BASIC METABOLIC PANEL
ANION GAP: 3 — AB (ref 5–15)
BUN: 20 mg/dL (ref 6–20)
CHLORIDE: 106 mmol/L (ref 101–111)
CO2: 23 mmol/L (ref 22–32)
Calcium: 8 mg/dL — ABNORMAL LOW (ref 8.9–10.3)
Creatinine, Ser: 1 mg/dL (ref 0.44–1.00)
GFR calc non Af Amer: 60 mL/min — ABNORMAL LOW (ref >60)
Glucose, Bld: 103 mg/dL — ABNORMAL HIGH (ref 65–99)
POTASSIUM: 5.3 mmol/L — AB (ref 3.5–5.1)
SODIUM: 132 mmol/L — AB (ref 135–145)

## 2015-07-08 LAB — GLUCOSE, CAPILLARY
GLUCOSE-CAPILLARY: 181 mg/dL — AB (ref 65–99)
GLUCOSE-CAPILLARY: 143 mg/dL — AB (ref 65–99)
GLUCOSE-CAPILLARY: 104 mg/dL — AB (ref 65–99)
Glucose-Capillary: 104 mg/dL — ABNORMAL HIGH (ref 65–99)

## 2015-07-08 MED ORDER — BUPRENORPHINE 15 MCG/HR TD PTWK
15.0000 ug | MEDICATED_PATCH | TRANSDERMAL | Status: DC
  Administered 2015-07-08: 15 ug via TRANSDERMAL

## 2015-07-08 NOTE — Care Management Important Message (Signed)
Important Message  Patient Details  Name: Samantha Mcmillan MRN: [id number] Date of Birth: [birth year]   Medicare Important Message Given:  Yes    Juliann Pulse A Harun Brumley 07/08/2015, 11:45 AM

## 2015-07-08 NOTE — Progress Notes (Signed)
Physical Therapy Treatment Patient Details Name: Samantha Mcmillan MRN: [id number] DOB: 09-08-1955 Today's Date: 07/08/2015    History of Present Illness Samantha Mcmillan is a 60yo white female who comes to [facility] after 3d of AMS and weakness, noted to be hyponatremia and hyperkalemic. The following history is obtained from the patient but not verified from the EMR: pt sustained a fall at home ~3WA, seen by Dr. Lisette Grinder, noted to have a Left fibular fracture, which wa spalnned to be treated conservatively, and pt asked to be NWB and on 'bedrest'. The patient had recently just finished HHPT ~4WA and was tolerating 25-24min community distance AMB c RW adn decreased gait speed.  The patient sustained another fall last week, this time falling onto her left knee, after which she has had severe pain in the left knee mostly with active knee extention and positively correlated with contractile force. The patient since has become very weak, unable to perform bed mobility, tranfers, or AMB without heavy caregiver assistance.     PT Comments    Pt ready for session with brace donned.Manson Passey note reviewed.  She is TTWB with brace and ok for gently knee ROM.  To edge of bed with ease and use of rail.  On first attempt at standing pt did not maintain TTWB well.  She was quick to stand and did not wait for proper instructions on how to maintain.  She sat and was given instructions and on subsequent stands did well with TTWB.  She had difficulty with TTWB while attempting to amb 3' to chair.  NWB gait pattern was then tried and she was able to maintain WB status well as putting her foot down for balance confuses her.  She participated in gently AROM as described below.   Follow Up Recommendations  SNF     Equipment Recommendations       Recommendations for Other Services       Precautions / Restrictions Restrictions Weight Bearing Restrictions: Yes LLE Weight Bearing: Touchdown weight bearing     Mobility  Bed Mobility Overal bed mobility: Modified Independent (with rail)                Transfers Overall transfer level: Needs assistance Equipment used: Rolling walker (2 wheeled) Transfers: Sit to/from Stand Sit to Stand: Supervision (verbal cues for ttwb)         General transfer comment: able to amintaind ttwb with transfer today with verbal cues  Ambulation/Gait Ambulation/Gait assistance: Min assist Ambulation Distance (Feet): 3 Feet Assistive device: Rolling walker (2 wheeled)   Gait velocity: slow Gait velocity interpretation: <1.8 ft/sec, indicative of risk for recurrent falls General Gait Details: Pt had difficulty with ttwb LLE but does well with NWB step pattern   Stairs            Wheelchair Mobility    Modified Rankin (Stroke Patients Only)       Balance                                    Cognition Arousal/Alertness: Awake/alert Behavior During Therapy: WFL for tasks assessed/performed Overall Cognitive Status: Within Functional Limits for tasks assessed                      Exercises General Exercises - Lower Extremity Ankle Circles/Pumps: AROM;10 reps;Both Long Arc Quad: AROM;Left;10 reps;Seated Hip ABduction/ADduction: AROM;Left;10 reps;Supine Straight Leg Raises:  AROM;Left;10 reps;Supine Hip Flexion/Marching: AROM;Left;10 reps;Standing    General Comments        Pertinent Vitals/Pain Pain Assessment: 0-10 Pain Score: 2  Pain Location: L knee Pain Descriptors / Indicators: Aching Pain Intervention(s): Limited activity within patient's tolerance    Home Living                      Prior Function            PT Goals (current goals can now be found in the care plan section) Acute Rehab PT Goals Patient Stated Goal: Regain strength and inep Progress towards PT goals: Progressing toward goals    Frequency  Min 2X/week    PT Plan Current plan remains appropriate     Co-evaluation             End of Session Equipment Utilized During Treatment: Gait belt Activity Tolerance: Patient tolerated treatment well;Patient limited by fatigue Patient left: in chair;with call bell/phone within reach;with chair alarm set     Time: 1421-1435 PT Time Calculation (min) (ACUTE ONLY): 14 min  Charges:  $Gait Training: 8-22 mins $Therapeutic Exercise: 8-22 mins                    G Codes:      Chesley Noon, PTA 07/08/2015, 3:35 PM

## 2015-07-08 NOTE — Clinical Social Work Note (Signed)
CSW spoke with patient this morning and extended bed offers. Patient is going to discuss the offers with her son and make a decision. CSW provided supportive listening as patient was enjoying talking with CSW about her 5 children and 12 grandchildren. Patient is aware that she may discharge tomorrow. Shela Leff MSW,LCSW [phone]

## 2015-07-08 NOTE — Progress Notes (Signed)
[facility] NAME: Samantha Mcmillan    MR#:  [id number]  DATE OF BIRTH:  March 08, 1955  SUBJECTIVE:   Doing well today. She does have some pain today and would like her pain patch to be restarted.  REVIEW OF SYSTEMS:    Review of Systems  Constitutional: Negative for fever, chills and malaise/fatigue.  HENT: Negative for ear discharge, ear pain, hearing loss, nosebleeds and sore throat.   Eyes: Negative for blurred vision and pain.  Respiratory: Negative for cough, hemoptysis, shortness of breath and wheezing.   Cardiovascular: Negative for chest pain, palpitations and leg swelling.  Gastrointestinal: Negative for nausea, vomiting, abdominal pain, diarrhea and blood in stool.  Genitourinary: Negative for dysuria.  Musculoskeletal: Positive for back pain and joint pain.  Neurological: Negative for dizziness, tremors, speech change, focal weakness, seizures and headaches.  Endo/Heme/Allergies: Does not bruise/bleed easily.  Psychiatric/Behavioral: Negative for depression, suicidal ideas and hallucinations.    Tolerating Diet: yes      DRUG ALLERGIES:   Allergies  Allergen Reactions  . Fish Allergy Anaphylaxis and Swelling  . Iodine Shortness Of Breath  . Latex Anaphylaxis  . Percocet [Oxycodone-Acetaminophen] Shortness Of Breath  . Shellfish Allergy Anaphylaxis and Swelling  . Amitriptyline Other (See Comments)    Reaction:  Mental changes   . Tape Other (See Comments)    Pt states that it pulls her skin off.   . Wellbutrin [Bupropion] Other (See Comments)    Reaction:  Mental changes   . Augmentin [Amoxicillin-Pot Clavulanate] Rash and Other (See Comments)    Has patient had a PCN reaction causing immediate rash, facial/tongue/throat swelling, SOB or lightheadedness with hypotension: No Has patient had a PCN reaction causing severe rash involving mucus membranes or skin necrosis: No Has patient had a PCN reaction that  required hospitalization No Has patient had a PCN reaction occurring within the last 10 years: No If all of the above answers are "NO", then may proceed with Cephalosporin use.  . Codeine Rash  . Cortisone Rash  . Diphenhydramine Rash  . Other Rash and Other (See Comments)    Pt states that she is allergic to Darvocet.   . Vicodin [Hydrocodone-Acetaminophen] Rash and Other (See Comments)    Reaction:  Dizziness      VITALS:  Blood pressure 156/78, pulse 78, temperature 98.3 F (36.8 C), temperature source Oral, resp. rate 19, height 5\' 4"  (1.626 m), weight 81.421 kg (179 lb 8 oz), SpO2 100 %.  PHYSICAL EXAMINATION:   Physical Exam  Constitutional: She is oriented to person, place, and time and well-developed, well-nourished, and in no distress. No distress.  HENT:  Head: Normocephalic.  Eyes: No scleral icterus.  Neck: Normal range of motion. Neck supple. No JVD present. No tracheal deviation present.  Cardiovascular: Normal rate, regular rhythm and normal heart sounds.  Exam reveals no gallop and no friction rub.   No murmur heard. Pulmonary/Chest: Effort normal and breath sounds normal. No respiratory distress. She has no wheezes. She has no rales. She exhibits no tenderness.  Abdominal: Soft. Bowel sounds are normal. She exhibits no distension and no mass. There is no tenderness. There is no rebound and no guarding.  Musculoskeletal: Normal range of motion. She exhibits no edema.  Left knee brace  Neurological: She is alert and oriented to person, place, and time.  Skin: Skin is warm. No rash noted. No erythema.  Psychiatric: Affect and judgment normal.  LABORATORY PANEL:   CBC  Recent Labs Lab 07/07/15 0537  WBC 4.1  HGB 10.7*  HCT 31.2*  PLT 106*   ------------------------------------------------------------------------------------------------------------------  Chemistries   Recent Labs Lab 07/07/15 0537  07/08/15 0808  NA 134*  < > 132*  K 5.6*  <  > 5.3*  CL 105  < > 106  CO2 24  < > 23  GLUCOSE 118*  < > 103*  BUN 43*  < > 20  CREATININE 1.60*  < > 1.00  CALCIUM 8.3*  < > 8.0*  AST 29  --   --   ALT 16  --   --   ALKPHOS 119  --   --   BILITOT 0.5  --   --   < > = values in this interval not displayed. ------------------------------------------------------------------------------------------------------------------  Cardiac Enzymes  Recent Labs Lab 07/06/15 1432  TROPONINI <0.03   ------------------------------------------------------------------------------------------------------------------  RADIOLOGY:  Ct Head Wo Contrast  07/06/2015  CLINICAL DATA:  Pt here with increased altered mental status over the last week per family member. Family states she is coming off of pain medication from a knee injury. EXAM: CT HEAD WITHOUT CONTRAST TECHNIQUE: Contiguous axial images were obtained from the base of the skull through the vertex without intravenous contrast. COMPARISON:  04/15/2015 FINDINGS: There is central and cortical atrophy. Periventricular white matter changes are consistent with small vessel disease. There is no intra or extra-axial fluid collection or mass lesion. The basilar cisterns and ventricles have a normal appearance. There is no CT evidence for acute infarction or hemorrhage. Bone windows show no acute fracture. Residual mild left frontal scalp edema. Paranasal and mastoid air cells are normally aerated. Study quality is degraded by patient motion artifact. IMPRESSION: 1. Atrophy and small vessel disease. 2.  No evidence for acute intracranial abnormality. Electronically Signed   By: Nolon Nations M.D.   On: 07/06/2015 16:35   Dg Chest Portable 1 View  07/06/2015  CLINICAL DATA:  Confusion.  Knee fracture 2 weeks ago. EXAM: PORTABLE CHEST 1 VIEW COMPARISON:  04/15/2015 FINDINGS: Patient is rotated to the right. Lungs are adequately inflated without focal consolidation or effusion. Cardiomediastinal silhouette  and remainder of the exam is unchanged. IMPRESSION: No active disease. Electronically Signed   By: Marin Olp M.D.   On: 07/06/2015 16:09   Dg Knee Left Port  07/07/2015  CLINICAL DATA:  Left knee injury, pain for 2 weeks EXAM: PORTABLE LEFT KNEE - 1-2 VIEW COMPARISON:  06/25/2015 FINDINGS: Five views of the left knee submitted. Again noted nondisplaced fracture in proximal medial tibial metaphysis. There is nondisplaced fracture in proximal left fibula metaphysis. Moderate joint effusion. Mild narrowing of medial joint compartment. IMPRESSION: Again noted nondisplaced fracture in proximal medial tibial metaphysis. There is nondisplaced fracture in proximal left fibula metaphysis. Moderate joint effusion. Mild narrowing of medial joint compartment. Electronically Signed   By: Lahoma Crocker M.D.   On: 07/07/2015 11:38     ASSESSMENT AND PLAN:   60 year old female with liver cirrhosis and chronic pain syndrome who presented with encephalopathy and hyperkalemia.  1. Acute metabolic encephalopathy: This is due to overmedication on narcotics. She is At her baseline.   2. Hyperkalemia was treated with Kayexalate, D50 and insulin. Potassium has improved. Lisinopril and Aldactone have been discontinued for now.  3. Acute kidney injury: This is due to poor by mouth intake and nephrotoxic agents including lisinopril and Aldactone. Repeat BMP in a.m. 4. Hyponatremia: This is due to poor by  mouth intake and also has improved. Repeat BMP in a.m. 5. Chronic pain syndrome: Restart pain patch. She'll follow-up with her pain clinician at discharge.  6. Right knee pain/injury with nondisplaced fracture involving the medial tibial plateau:As per Dr Octavio Manns: Patient may participate with physical therapy for gentle knee range of motion exercises and nonweightbearing lower extremities strengthening. She may be toe-touch weightbearing on the left lower extremity while using her walker. She should not stand or  ambulate without her hinged knee brace in place. Patient has an appointment to follow up with him on May 19.   7. Liver cirrhosis: Continue lactulose. Aldactone has been discontinued for now due to hyperkalemia. This will be reordered as an outpatient.       Management plans discussed with the patient and she is in agreement.  CODE STATUS: FULL  TOTAL TIME TAKING CARE OF THIS PATIENT: 27 minutes.     POSSIBLE D/C tomorrow to skilled nursing facility, DEPENDING ON CLINICAL CONDITION.   Alistair Senft M.D on 07/08/2015 at 10:24 AM  Between 7am to 6pm - Pager - [phone] After 6pm go to [email or web address] - password [email or web address] [facility]  Office  [phone]  CC: Primary care physician; Casilda Carls, MD  Note: This dictation was prepared with Dragon dictation along with smaller phrase technology. Any transcriptional errors that result from this process are unintentional.

## 2015-07-08 NOTE — Care Management (Addendum)
CSW following for SNF placement.  RNCM signing off.  Available if needed.

## 2015-07-09 LAB — BASIC METABOLIC PANEL
Anion gap: 4 — ABNORMAL LOW (ref 5–15)
BUN: 13 mg/dL (ref 6–20)
CHLORIDE: 106 mmol/L (ref 101–111)
CO2: 23 mmol/L (ref 22–32)
CREATININE: 0.84 mg/dL (ref 0.44–1.00)
Calcium: 8.4 mg/dL — ABNORMAL LOW (ref 8.9–10.3)
GFR calc Af Amer: >60 mL/min (ref >60)
GFR calc non Af Amer: >60 mL/min (ref >60)
Glucose, Bld: 93 mg/dL (ref 65–99)
Potassium: 5 mmol/L (ref 3.5–5.1)
SODIUM: 133 mmol/L — AB (ref 135–145)

## 2015-07-09 LAB — GLUCOSE, CAPILLARY
GLUCOSE-CAPILLARY: 91 mg/dL (ref 65–99)
Glucose-Capillary: 68 mg/dL (ref 65–99)
Glucose-Capillary: 223 mg/dL — ABNORMAL HIGH (ref 65–99)

## 2015-07-09 LAB — LAMOTRIGINE LEVEL: LAMOTRIGINE LVL: 3.9 ug/mL (ref 2.0–20.0)

## 2015-07-09 LAB — MAGNESIUM: MAGNESIUM: 1.7 mg/dL (ref 1.7–2.4)

## 2015-07-09 MED ORDER — MAGNESIUM OXIDE 400 MG PO TABS: 400.0000 mg | ORAL_TABLET | Freq: Every day | ORAL | Status: DC

## 2015-07-09 MED ORDER — HYDROMORPHONE HCL 2 MG PO TABS: 2.0000 mg | ORAL_TABLET | Freq: Four times a day (QID) | ORAL | Status: AC | PRN

## 2015-07-09 MED ORDER — BUPRENORPHINE 15 MCG/HR TD PTWK: 20.0000 ug | MEDICATED_PATCH | TRANSDERMAL | Status: DC

## 2015-07-09 NOTE — Clinical Social Work Placement (Signed)
   CLINICAL SOCIAL WORK PLACEMENT  NOTE  Date:  07/09/2015  Patient Details  Name: Samantha Mcmillan MRN: [id number] Date of Birth: 19-Sep-1955  Clinical Social Work is seeking post-discharge placement for this patient at the [place] level of care (*CSW will initial, date and re-position this form in  chart as items are completed):  Yes   Patient/family provided with [place] Work Department's list of facilities offering this level of care within the geographic area requested by the patient (or if unable, by the patient's family).  Yes   Patient/family informed of their freedom to choose among providers that offer the needed level of care, that participate in Medicare, Medicaid or managed care program needed by the patient, have an available bed and are willing to accept the patient.  Yes   Patient/family informed of [place]'s ownership interest in [facility] and [facility], as well as of the fact that they are under no obligation to receive care at these facilities.  PASRR submitted to EDS on       PASRR number received on       Existing PASRR number confirmed on 07/07/15     FL2 transmitted to all facilities in geographic area requested by pt/family on 07/07/15     FL2 transmitted to all facilities within larger geographic area on       Patient informed that his/her managed care company has contracts with or will negotiate with certain facilities, including the following:        Yes   Patient/family informed of bed offers received.  Patient chooses bed at  [facility])     Physician recommends and patient chooses bed at  [facility])    Patient to be transferred to  [facility]) on 07/09/15.  Patient to be transferred to facility by  (family vehicle)     Patient family notified on 07/09/15 of transfer.  Name of family member notified:  patient notified her son     PHYSICIAN       Additional Comment:     _______________________________________________ Shela Leff, LCSW 07/09/2015, 2:27 PM

## 2015-07-09 NOTE — Clinical Social Work Note (Signed)
Patient has chosen [facility]. Will facilitate discharge when cleared for discharge by MD. Shela Leff MSW,LCSW [phone]

## 2015-07-09 NOTE — Progress Notes (Signed)
Physical Therapy Treatment Patient Details Name: Samantha Mcmillan MRN: [id number] DOB: 08-13-1955 Today's Date: 07/09/2015    History of Present Illness Deatrice Mowdy is a 60yo white female who comes to [facility] after 3d of AMS and weakness, noted to be hyponatremia and hyperkalemic. The following history is obtained from the patient but not verified from the EMR: pt sustained a fall at home ~3WA, seen by Dr. Lisette Grinder, noted to have a Left fibular fracture, which wa spalnned to be treated conservatively, and pt asked to be NWB and on 'bedrest'. The patient had recently just finished HHPT ~4WA and was tolerating 25-72min community distance AMB c RW adn decreased gait speed.  The patient sustained another fall last week, this time falling onto her left knee, after which she has had severe pain in the left knee mostly with active knee extention and positively correlated with contractile force. The patient since has become very weak, unable to perform bed mobility, tranfers, or AMB without heavy caregiver assistance.     PT Comments    Pt awake and ready for session.  Transfer to edge of bed with ease with use of rail.  Pt was able to stand on first attempt and maintain ttwb today.  She transferred to chair with supervision.  Pt was able to ambulate 8' with walker and min assist for safety/balance with NWB gait pattern.  Attempted TTWB but she again had difficulty not putting too much weight on her LE so we continued with NWB pattern.  Gentle AROM as described below.   Follow Up Recommendations  SNF     Equipment Recommendations       Recommendations for Other Services       Precautions / Restrictions Restrictions Weight Bearing Restrictions: Yes LLE Weight Bearing: Touchdown weight bearing    Mobility  Bed Mobility Overal bed mobility: Modified Independent             General bed mobility comments: rail  Transfers Overall transfer level: Needs assistance Equipment used: Rolling  walker (2 wheeled) (verbal cues for wb status) Transfers: Sit to/from Stand Sit to Stand: Supervision         General transfer comment: did well with ttwb today  Ambulation/Gait Ambulation/Gait assistance: Min assist Ambulation Distance (Feet): 8 Feet Assistive device: Rolling walker (2 wheeled) Gait Pattern/deviations: Step-to pattern Gait velocity: slow Gait velocity interpretation: <1.8 ft/sec, indicative of risk for recurrent falls General Gait Details: NWB gait pattern used to maintain TTWB again today   Stairs            Wheelchair Mobility    Modified Rankin (Stroke Patients Only)       Balance                                    Cognition Arousal/Alertness: Awake/alert Behavior During Therapy: WFL for tasks assessed/performed Overall Cognitive Status: Within Functional Limits for tasks assessed                      Exercises General Exercises - Lower Extremity Ankle Circles/Pumps: AROM;10 reps;Both Long Arc Quad: AROM;Left;10 reps;Seated Heel Slides: AROM;Left;10 reps;Supine Hip ABduction/ADduction: AROM;Left;10 reps;Supine Straight Leg Raises: AROM;Left;10 reps;Supine Hip Flexion/Marching: AROM;Left;10 reps;Standing    General Comments        Pertinent Vitals/Pain Pain Assessment: 0-10 Pain Score: 2  Pain Location: L knee Pain Descriptors / Indicators: Aching Pain Intervention(s): Limited activity within patient's  tolerance    Home Living                      Prior Function            PT Goals (current goals can now be found in the care plan section) Progress towards PT goals: Progressing toward goals    Frequency  Min 2X/week    PT Plan Current plan remains appropriate    Co-evaluation             End of Session Equipment Utilized During Treatment: Gait belt Activity Tolerance: Patient tolerated treatment well;Patient limited by fatigue Patient left: in chair;with call bell/phone within  reach;with chair alarm set     Time: [id number] PT Time Calculation (min) (ACUTE ONLY): 23 min  Charges:  $Gait Training: 8-22 mins $Therapeutic Exercise: 8-22 mins                    G Codes:      Chesley Noon, PTA 07/09/2015, 9:46 AM

## 2015-07-09 NOTE — Clinical Social Work Note (Signed)
Patient discharged to [facility] today. Patient's son will transport patient. Discharge information sent to [facility]. Nurse called report. Shela Leff MSW,LCSW [phone]

## 2015-07-09 NOTE — Progress Notes (Signed)
Pt discharged to facility. Concerns addressed. IV site removed. Report called to [facility]. Pt discharged with significant other. Pt folder of information given.

## 2015-07-09 NOTE — Progress Notes (Signed)
Subjective:   Patient known to our practice from outpatient Outpatient Cr 0.78 in March Admission Cr 2.20 Improved today to 0.84 potassium corrected to 5.0  Objective:  Vital signs in last 24 hours:  Temp:  [97.7 F (36.5 C)-98.4 F (36.9 C)] 98.4 F (36.9 C) (05/16 0518) Pulse Rate:  [71-81] 71 (05/16 0518) Resp:  [19-20] 20 (05/15 2029) BP: (137-157)/(69-72) 157/69 mmHg (05/16 0518) SpO2:  [96 %-100 %] 96 % (05/16 0518) Weight:  [81.557 kg (179 lb 12.8 oz)] 81.557 kg (179 lb 12.8 oz) (05/16 0500)  Weight change: 0.136 kg (4.8 oz) Filed Weights   07/06/15 2000 07/08/15 0324 07/09/15 0500  Weight: 82.5 kg (181 lb 14.1 oz) 81.421 kg (179 lb 8 oz) 81.557 kg (179 lb 12.8 oz)    Intake/Output:    Intake/Output Summary (Last 24 hours) at 07/09/15 1105 Last data filed at 07/09/15 0900  Gross per 24 hour  Intake    920 ml  Output      0 ml  Net    920 ml     Physical Exam: General: NAD, laying in bed  HEENT Moist oral mucus membranes  Neck supple  Pulm/lungs Normal effort, clear  CVS/Heart No rub or gallop  Abdomen:  Soft, NT  Extremities: Trace edema  Neurologic: Alert, oreinted             Basic Metabolic Panel:   Recent Labs Lab 07/06/15 1919 07/06/15 2330 07/07/15 0537 07/07/15 1009 07/08/15 0808 07/09/15 0536  NA 127* 133* 134* 132* 132* 133*  K 7.2* 5.9* 5.6* 6.6* 5.3* 5.0  CL 95* 100* 105 105 106 106  CO2 24 26 24  21* 23 23  GLUCOSE 135* 89 118* 222* 103* 93  BUN 55* 53* 43* 38* 20 13  CREATININE 1.99* 1.80* 1.60* 1.53* 1.00 0.84  CALCIUM 8.0* 8.5* 8.3* 8.0* 8.0* 8.4*  MG  --   --   --   --   --  1.7  PHOS 3.9 3.8  --   --   --   --      CBC:  Recent Labs Lab 07/06/15 1432 07/07/15 0537  WBC 7.0 4.1  HGB 12.0 10.7*  HCT 35.7 31.2*  MCV 93.5 92.6  PLT 132* 106*      Microbiology:  Recent Results (from the past 720 hour(s))  MRSA PCR Screening     Status: None   Collection Time: 07/06/15  7:44 PM  Result Value Ref Range  Status   MRSA by PCR NEGATIVE NEGATIVE Final    Comment:        The GeneXpert MRSA Assay (FDA approved for NASAL specimens only), is one component of a comprehensive MRSA colonization surveillance program. It is not intended to diagnose MRSA infection nor to guide or monitor treatment for MRSA infections.     Coagulation Studies:  Recent Labs  07/06/15 2149  LABPROT 15.0  INR 1.16    Urinalysis:  Recent Labs  07/06/15 1647  COLORURINE YELLOW*  LABSPEC 1.009  PHURINE 7.0  GLUCOSEU NEGATIVE  HGBUR NEGATIVE  BILIRUBINUR NEGATIVE  KETONESUR NEGATIVE  PROTEINUR NEGATIVE  NITRITE NEGATIVE  LEUKOCYTESUR NEGATIVE      Imaging: No results found.   Medications:     . allopurinol  100 mg Oral Daily  . ARIPiprazole  5 mg Oral QHS  . atorvastatin  10 mg Oral QHS  . Buprenorphine  15 mcg Transdermal Weekly  . Ferrous Fumarate  1 tablet Oral BID WC  .  gabapentin  300 mg Oral QHS  . insulin aspart  0-9 Units Subcutaneous TID WC  . lamoTRIgine  150 mg Oral QHS  . levothyroxine  88 mcg Oral QAC breakfast  . metoprolol tartrate  25 mg Oral BID  . mometasone-formoterol  2 puff Inhalation BID  . montelukast  10 mg Oral QHS  . multivitamin with minerals  1 tablet Oral Daily  . pantoprazole  40 mg Oral Daily  . senna-docusate  1 tablet Oral BID  . sertraline  100 mg Oral BID  . sodium chloride flush  3 mL Intravenous Q12H   albuterol, bisacodyl, guaiFENesin, HYDROmorphone, lactulose, metoCLOPramide, ondansetron **OR** ondansetron (ZOFRAN) IV  Assessment/ Plan:  60 y.o.caucasian female  with anemia, hypothyroidism, hyponatremia, COPD, gout, hypertension, chronic edema, hyperlipidemia, diabetes mellitus type 2, GERD , Cirrhosis  1. ARF, likely due to pre-renal state. Improved  2  Hyperkalemia- treated with shifting measures  Recommend discontinue ACE-i and spironolactone SPS for hyperkalemia Will set up follow up in office for this Friday  D/w Dr Benjie Karvonen        LOS: [place] 5/16/201711:05 AM

## 2015-07-19 ENCOUNTER — Other Ambulatory Visit: Payer: Medicare Other

## 2015-07-19 ENCOUNTER — Ambulatory Visit: Payer: Medicare Other | Admitting: Internal Medicine

## 2015-07-24 ENCOUNTER — Inpatient Hospital Stay: Payer: Medicare Other | Admitting: Internal Medicine

## 2015-07-24 ENCOUNTER — Encounter: Payer: Self-pay | Admitting: *Deleted

## 2015-07-24 ENCOUNTER — Inpatient Hospital Stay: Payer: Medicare Other

## 2015-07-24 NOTE — Progress Notes (Signed)
pt no showed today with Dr. Rogue Bussing. pt was in [place] recently.  Her labs from hospital are currently stable.  Per md, we can r/s this apt in 1 month with [name], NP.  Msg sent to cancer center scheduling to r/s this apt in 1 month and to notify [employer] with this apt date/time.

## 2015-08-07 ENCOUNTER — Other Ambulatory Visit: Payer: Self-pay | Admitting: *Deleted

## 2015-08-07 ENCOUNTER — Other Ambulatory Visit: Payer: Self-pay | Admitting: Internal Medicine

## 2015-08-07 MED ORDER — FERROUS FUM-IRON POLYSACCH 162-115.2 MG PO CAPS: 1.0000 | ORAL_CAPSULE | Freq: Two times a day (BID) | ORAL | Status: DC

## 2015-08-08 ENCOUNTER — Inpatient Hospital Stay: Payer: Medicare Other | Attending: Internal Medicine

## 2015-08-08 ENCOUNTER — Inpatient Hospital Stay (HOSPITAL_BASED_OUTPATIENT_CLINIC_OR_DEPARTMENT_OTHER): Payer: Medicare Other | Admitting: Internal Medicine

## 2015-08-08 VITALS — BP 128/77 | HR 61 | Temp 96.4°F | Resp 18 | Wt 176.1 lb

## 2015-08-08 DIAGNOSIS — N189 Chronic kidney disease, unspecified: Secondary | ICD-10-CM | POA: Diagnosis not present

## 2015-08-08 DIAGNOSIS — D696 Thrombocytopenia, unspecified: Secondary | ICD-10-CM

## 2015-08-08 DIAGNOSIS — I129 Hypertensive chronic kidney disease with stage 1 through stage 4 chronic kidney disease, or unspecified chronic kidney disease: Secondary | ICD-10-CM | POA: Insufficient documentation

## 2015-08-08 DIAGNOSIS — K7460 Unspecified cirrhosis of liver: Secondary | ICD-10-CM | POA: Diagnosis not present

## 2015-08-08 DIAGNOSIS — I5030 Unspecified diastolic (congestive) heart failure: Secondary | ICD-10-CM | POA: Insufficient documentation

## 2015-08-08 DIAGNOSIS — D509 Iron deficiency anemia, unspecified: Secondary | ICD-10-CM

## 2015-08-08 DIAGNOSIS — Z79899 Other long term (current) drug therapy: Secondary | ICD-10-CM | POA: Diagnosis not present

## 2015-08-08 DIAGNOSIS — D61818 Other pancytopenia: Secondary | ICD-10-CM | POA: Diagnosis not present

## 2015-08-08 DIAGNOSIS — Q2733 Arteriovenous malformation of digestive system vessel: Secondary | ICD-10-CM | POA: Insufficient documentation

## 2015-08-08 DIAGNOSIS — J449 Chronic obstructive pulmonary disease, unspecified: Secondary | ICD-10-CM | POA: Insufficient documentation

## 2015-08-08 DIAGNOSIS — R161 Splenomegaly, not elsewhere classified: Secondary | ICD-10-CM | POA: Diagnosis not present

## 2015-08-08 DIAGNOSIS — E871 Hypo-osmolality and hyponatremia: Secondary | ICD-10-CM | POA: Insufficient documentation

## 2015-08-08 DIAGNOSIS — Z8781 Personal history of (healed) traumatic fracture: Secondary | ICD-10-CM | POA: Diagnosis not present

## 2015-08-08 DIAGNOSIS — R5383 Other fatigue: Secondary | ICD-10-CM

## 2015-08-08 LAB — CBC WITH DIFFERENTIAL/PLATELET
Basophils Absolute: 0.1 10*3/uL (ref 0–0.1)
Basophils Relative: 1 %
Eosinophils Absolute: 0.1 10*3/uL (ref 0–0.7)
Eosinophils Relative: 1 %
HEMATOCRIT: 34 % — AB (ref 35.0–47.0)
HEMOGLOBIN: 11.6 g/dL — AB (ref 12.0–16.0)
LYMPHS ABS: 0.9 10*3/uL — AB (ref 1.0–3.6)
LYMPHS PCT: 18 %
MCH: 31.4 pg (ref 26.0–34.0)
MCHC: 34.2 g/dL (ref 32.0–36.0)
MCV: 91.7 fL (ref 80.0–100.0)
MONO ABS: 0.2 10*3/uL (ref 0.2–0.9)
MONOS PCT: 3 %
NEUTROS ABS: 3.7 10*3/uL (ref 1.4–6.5)
Neutrophils Relative %: 77 %
Platelets: 141 10*3/uL — ABNORMAL LOW (ref 150–440)
RBC: 3.71 MIL/uL — ABNORMAL LOW (ref 3.80–5.20)
RDW: 14.9 % — AB (ref 11.5–14.5)
WBC: 4.9 10*3/uL (ref 3.6–11.0)

## 2015-08-08 LAB — COMPREHENSIVE METABOLIC PANEL
ALK PHOS: 171 U/L — AB (ref 38–126)
ALT: 14 U/L (ref 14–54)
ANION GAP: 8 (ref 5–15)
AST: 25 U/L (ref 15–41)
Albumin: 3.3 g/dL — ABNORMAL LOW (ref 3.5–5.0)
BILIRUBIN TOTAL: 0.6 mg/dL (ref 0.3–1.2)
BUN: 20 mg/dL (ref 6–20)
CALCIUM: 8.4 mg/dL — AB (ref 8.9–10.3)
CO2: 30 mmol/L (ref 22–32)
CREATININE: 1.01 mg/dL — AB (ref 0.44–1.00)
Chloride: 88 mmol/L — ABNORMAL LOW (ref 101–111)
GFR calc non Af Amer: 59 mL/min — ABNORMAL LOW (ref >60)
GLUCOSE: 121 mg/dL — AB (ref 65–99)
Potassium: 4.1 mmol/L (ref 3.5–5.1)
Sodium: 126 mmol/L — ABNORMAL LOW (ref 135–145)
TOTAL PROTEIN: 6.7 g/dL (ref 6.5–8.1)

## 2015-08-08 LAB — FERRITIN: Ferritin: 50 ng/mL (ref 11–307)

## 2015-08-08 NOTE — Assessment & Plan Note (Signed)
iron deficiency question AVMs on by mouth iron. white count 4.9/hemoglobin today is 11.5 platelets 141 Recommend continued by mouth iron. No new recommendations.

## 2015-08-08 NOTE — Assessment & Plan Note (Addendum)
Sodium today is 126/secondary to cirrhosis. Chronic. She continues to follow up with nephrology.

## 2015-08-08 NOTE — Progress Notes (Signed)
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  Patient was admitted to the hospital for worsening hyponatremia sodium 116; also noted to be hyperkalemic/and also acute renal failure. Patient has been since this is in the hospital she's doing fairly okay. She also had a fracture of her left lower extremity- recently discharged from rehabilitation.   She has mild fatigue. Not any worse. Denies any blood in stools black stools. She continues to be on oral iron pills.    REVIEW OF SYSTEMS:  Otherwise no fevers or chills. Complete 10 point review of system is done which is negative except mentioned above in history of present illness .   Social Hx- denies any current smoking or alcohol.Patient has been recently discharged from rehabilitation.  I have reviewed the past medical history, past surgical history, social history and family history with the patient and they are unchanged from previous Mcmillan unless stated above.  ALLERGIES:  is allergic to fish allergy; iodine; latex; percocet; shellfish allergy; amitriptyline; tape; wellbutrin; augmentin; codeine; cortisone; diphenhydramine; other; and vicodin.  MEDICATIONS:  Current Outpatient Prescriptions   Medication Sig Dispense Refill  . albuterol (PROVENTIL HFA;VENTOLIN HFA) 108 (90 BASE) MCG/ACT inhaler Inhale 2 puffs into the lungs every 4 (four) hours as needed for wheezing or shortness of breath.    Marland Kitchen albuterol (PROVENTIL) (2.5 MG/3ML) 0.083% nebulizer solution Take 2.5 mg by nebulization every 6 (six) hours as needed for wheezing or shortness of breath.    . allopurinol (ZYLOPRIM) 100 MG tablet Take 100 mg by mouth 2 (two) times daily.     . ARIPiprazole (ABILIFY) 5 MG tablet Take 5 mg by mouth at bedtime.     Marland Kitchen atorvastatin (LIPITOR) 10 MG tablet Take 10 mg by mouth at bedtime.     . budesonide-formoterol (SYMBICORT) 80-4.5 MCG/ACT inhaler Inhale 2 puffs into the lungs 2 (two) times daily.     . bumetanide (BUMEX) 1 MG tablet Take 1 mg by mouth 2 (two) times daily.    . Buprenorphine (BUTRANS) 15 MCG/HR PTWK Place 20 mcg onto the skin once a week. Pt applies on Sunday. 1 patch 0  . cyclobenzaprine (FLEXERIL) 10 MG tablet Take 10 mg by mouth 3 (three) times daily as needed for muscle spasms.     . ferrous fumarate-iron polysaccharide complex (TANDEM) 162-115.2 MG CAPS capsule Take 1 capsule by mouth 2 (two) times daily after a meal. 60 capsule 6  . fluticasone (FLONASE) 50 MCG/ACT nasal spray Place 1-2 sprays into both nostrils daily as needed for rhinitis.    Marland Kitchen gabapentin (NEURONTIN) 300 MG capsule Take 300 mg by mouth at bedtime.     Marland Kitchen HYDROmorphone (DILAUDID) 2 MG tablet Take 1 tablet (2 mg total) by mouth every 6 (six) hours as needed for severe pain. 30 tablet 0  . lactulose (CHRONULAC) 10 GM/15ML solution Take 20  g by mouth 2 (two) times daily as needed for mild constipation.    Marland Kitchen lamoTRIgine (LAMICTAL) 150 MG tablet Take 150 mg by mouth at bedtime.     Marland Kitchen levothyroxine (SYNTHROID, LEVOTHROID) 88 MCG tablet Take 88 mcg by mouth daily before breakfast.     . magnesium oxide (MAG-OX) 400 MG tablet Take 1 tablet (400 mg total) by mouth daily. 30 tablet 0  . metoCLOPramide (REGLAN) 10 MG  tablet Take 10 mg by mouth 4 (four) times daily as needed for nausea or vomiting.     . metoprolol tartrate (LOPRESSOR) 25 MG tablet Take 25 mg by mouth 2 (two) times daily.    . Multiple Vitamin (MULTIVITAMIN WITH MINERALS) TABS tablet Take 1 tablet by mouth daily.    . ondansetron (ZOFRAN) 4 MG tablet Take 4 mg by mouth every 8 (eight) hours as needed for nausea or vomiting.     . pantoprazole (PROTONIX) 40 MG tablet Take 40 mg by mouth daily.    Marland Kitchen senna-docusate (SENOKOT-S) 8.6-50 MG tablet Take 1 tablet by mouth 2 (two) times daily.    . sertraline (ZOLOFT) 100 MG tablet Take 100 mg by mouth 2 (two) times daily.      No current facility-administered medications for this visit.    PHYSICAL EXAMINATION:   BP 128/77 mmHg  Pulse 61  Temp(Src) 96.4 F (35.8 C) (Tympanic)  Resp 18  Wt 176 lb 2.4 oz (79.9 kg)  LMP  (LMP Unknown)  Filed Weights   08/08/15 1554  Weight: 176 lb 2.4 oz (79.9 kg)    GENERAL:alert, no distress and comfortable.Patient alone. Patient is wheelchair. Her left knee is bandaged. EYES: normal, Conjunctiva are pink and non-injected, sclera clear OROPHARYNX: Poor dentition ulceration  NECK: No thyromegaly LYMPH:  no palpable lymphadenopathy in the cervical, axillary or inguinal LUNGS: clear to auscultation with normal breathing effort; No Wheeze or crackles  Cardio-vascular- Regular Rate & Rythm and no murmurs and no lower extremity edema ABDOMEN:abdomen soft, non-tender and normal bowel sounds;positive for splenomegaly  Musculoskeletal:no cyanosis of digits and no clubbing  NEURO: alert & oriented x 3 with fluent speech, no focal motor/sensory deficits. SKIN:Multiple bruises noted on the anterior chest and bilateral upper extremities  LABORATORY DATA:  I have reviewed the data as listed    Component Value Date/Time   NA 126* 08/08/2015 1520   NA 128* 05/31/2014   NA 136 03/15/2014 1336   K 4.1 08/08/2015 1520   K 3.1* 03/15/2014 1336   CL 88* 08/08/2015  1520   CL 98 03/15/2014 1336   CO2 30 08/08/2015 1520   CO2 29 03/15/2014 1336   GLUCOSE 121* 08/08/2015 1520   GLUCOSE 57* 03/15/2014 1336   BUN 20 08/08/2015 1520   BUN 34* 05/31/2014   BUN 8 03/15/2014 1336   CREATININE 1.01* 08/08/2015 1520   CREATININE 1.1 05/31/2014   CREATININE 1.05 03/15/2014 1336   CALCIUM 8.4* 08/08/2015 1520   CALCIUM 7.7* 03/15/2014 1336   PROT 6.7 08/08/2015 1520   PROT 5.9* 03/15/2014 1336   PROT 5.7* 03/15/2014 1145   ALBUMIN 3.3* 08/08/2015 1520   ALBUMIN 2.1* 03/15/2014 1336   ALBUMIN 2.9* 03/15/2014 1145   AST 25 08/08/2015 1520   AST 40* 03/15/2014 1336   ALT 14 08/08/2015 1520   ALT 13* 03/15/2014 1336   ALKPHOS 171* 08/08/2015 1520   ALKPHOS 221* 03/15/2014 1336   BILITOT 0.6 08/08/2015 1520   BILITOT 0.4 03/15/2014 1336   GFRNONAA 59* 08/08/2015  [place] 03/15/2014 1336   GFRNONAA >60 03/06/2013 1855   GFRAA >60 08/08/2015 1520   GFRAA >60 03/15/2014 1336   GFRAA >60 03/06/2013 1855    No results found for: SPEP, UPEP  Lab Results  Component Value Date   WBC 4.9 08/08/2015   NEUTROABS 3.7 08/08/2015   HGB 11.6* 08/08/2015   HCT 34.0* 08/08/2015   MCV 91.7 08/08/2015   PLT 141* 08/08/2015      Chemistry      Component Value Date/Time   NA 126* 08/08/2015 1520   NA 128* 05/31/2014   NA 136 03/15/2014 1336   K 4.1 08/08/2015 1520   K 3.1* 03/15/2014 1336   CL 88* 08/08/2015 1520   CL 98 03/15/2014 1336   CO2 30 08/08/2015 1520   CO2 29 03/15/2014 1336   BUN 20 08/08/2015 1520   BUN 34* 05/31/2014   BUN 8 03/15/2014 1336   CREATININE 1.01* 08/08/2015 1520   CREATININE 1.1 05/31/2014   CREATININE 1.05 03/15/2014 1336   GLU 120 05/31/2014      Component Value Date/Time   CALCIUM 8.4* 08/08/2015 1520   CALCIUM 7.7* 03/15/2014 1336   ALKPHOS 171* 08/08/2015 1520   ALKPHOS 221* 03/15/2014 1336   AST 25 08/08/2015 1520   AST 40* 03/15/2014 1336   ALT 14 08/08/2015 1520   ALT 13* 03/15/2014 1336    BILITOT 0.6 08/08/2015 1520   BILITOT 0.4 03/15/2014 1336       RADIOGRAPHIC STUDIES: I have personally reviewed the radiological images as listed and agreed with the findings in the report. No results found.   ASSESSMENT & PLAN:   IDA (iron deficiency anemia)  iron deficiency question AVMs on by mouth iron. white count 4.9/hemoglobin today is 11.5 platelets 141 Recommend continued by mouth iron. No new recommendations.  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   Hyponatremia Sodium today is 126/secondary to cirrhosis. Chronic. She continues to follow up with nephrology.   Follow-up in 6 months/labs and studies.      Cammie Sickle, MD 08/08/2015 3:55 PM

## 2015-08-08 NOTE — Assessment & Plan Note (Signed)
Likely from hypersplenism/cirrhosis platelets today are 141.

## 2015-08-25 ENCOUNTER — Encounter: Payer: Self-pay | Admitting: Emergency Medicine

## 2015-08-25 ENCOUNTER — Emergency Department: Payer: Medicare Other

## 2015-08-25 ENCOUNTER — Emergency Department
Admission: EM | Admit: 2015-08-25 | Discharge: 2015-08-25 | Disposition: A | Discharge status: Home / Self Care | Payer: Medicare Other | Attending: Emergency Medicine | Admitting: Emergency Medicine

## 2015-08-25 DIAGNOSIS — J449 Chronic obstructive pulmonary disease, unspecified: Secondary | ICD-10-CM | POA: Diagnosis not present

## 2015-08-25 DIAGNOSIS — S89392A Other physeal fracture of lower end of left fibula, initial encounter for closed fracture: Secondary | ICD-10-CM | POA: Diagnosis not present

## 2015-08-25 DIAGNOSIS — J45909 Unspecified asthma, uncomplicated: Secondary | ICD-10-CM | POA: Diagnosis not present

## 2015-08-25 DIAGNOSIS — Z79899 Other long term (current) drug therapy: Secondary | ICD-10-CM | POA: Diagnosis not present

## 2015-08-25 DIAGNOSIS — F329 Major depressive disorder, single episode, unspecified: Secondary | ICD-10-CM | POA: Diagnosis not present

## 2015-08-25 DIAGNOSIS — W19XXXA Unspecified fall, initial encounter: Secondary | ICD-10-CM | POA: Insufficient documentation

## 2015-08-25 DIAGNOSIS — F17210 Nicotine dependence, cigarettes, uncomplicated: Secondary | ICD-10-CM | POA: Diagnosis not present

## 2015-08-25 DIAGNOSIS — Y999 Unspecified external cause status: Secondary | ICD-10-CM | POA: Insufficient documentation

## 2015-08-25 DIAGNOSIS — S81812A Laceration without foreign body, left lower leg, initial encounter: Secondary | ICD-10-CM

## 2015-08-25 DIAGNOSIS — S82832A Other fracture of upper and lower end of left fibula, initial encounter for closed fracture: Secondary | ICD-10-CM

## 2015-08-25 DIAGNOSIS — Y92009 Unspecified place in unspecified non-institutional (private) residence as the place of occurrence of the external cause: Secondary | ICD-10-CM | POA: Insufficient documentation

## 2015-08-25 DIAGNOSIS — Y939 Activity, unspecified: Secondary | ICD-10-CM | POA: Insufficient documentation

## 2015-08-25 DIAGNOSIS — E119 Type 2 diabetes mellitus without complications: Secondary | ICD-10-CM | POA: Diagnosis not present

## 2015-08-25 DIAGNOSIS — I5033 Acute on chronic diastolic (congestive) heart failure: Secondary | ICD-10-CM | POA: Diagnosis not present

## 2015-08-25 DIAGNOSIS — I110 Hypertensive heart disease with heart failure: Secondary | ICD-10-CM | POA: Insufficient documentation

## 2015-08-25 DIAGNOSIS — S8992XA Unspecified injury of left lower leg, initial encounter: Secondary | ICD-10-CM | POA: Diagnosis present

## 2015-08-25 LAB — CBC WITH DIFFERENTIAL/PLATELET
BASOS ABS: 0 10*3/uL (ref 0–0.1)
BASOS PCT: 1 %
Eosinophils Absolute: 0.1 10*3/uL (ref 0–0.7)
Eosinophils Relative: 2 %
HEMATOCRIT: 33.6 % — AB (ref 35.0–47.0)
HEMOGLOBIN: 11.4 g/dL — AB (ref 12.0–16.0)
LYMPHS ABS: 0.7 10*3/uL — AB (ref 1.0–3.6)
LYMPHS PCT: 21 %
MCH: 31.6 pg (ref 26.0–34.0)
MCHC: 33.9 g/dL (ref 32.0–36.0)
MCV: 93 fL (ref 80.0–100.0)
MONO ABS: 0.2 10*3/uL (ref 0.2–0.9)
MONOS PCT: 5 %
NEUTROS ABS: 2.3 10*3/uL (ref 1.4–6.5)
Neutrophils Relative %: 71 %
Platelets: 101 10*3/uL — ABNORMAL LOW (ref 150–440)
RBC: 3.61 MIL/uL — ABNORMAL LOW (ref 3.80–5.20)
RDW: 15.2 % — AB (ref 11.5–14.5)
WBC: 3.2 10*3/uL — ABNORMAL LOW (ref 3.6–11.0)

## 2015-08-25 LAB — BASIC METABOLIC PANEL
ANION GAP: 6 (ref 5–15)
BUN: 17 mg/dL (ref 6–20)
CO2: 30 mmol/L (ref 22–32)
Calcium: 8.4 mg/dL — ABNORMAL LOW (ref 8.9–10.3)
Chloride: 95 mmol/L — ABNORMAL LOW (ref 101–111)
Creatinine, Ser: 0.92 mg/dL (ref 0.44–1.00)
GFR calc Af Amer: >60 mL/min (ref >60)
GFR calc non Af Amer: >60 mL/min (ref >60)
GLUCOSE: 164 mg/dL — AB (ref 65–99)
POTASSIUM: 3.7 mmol/L (ref 3.5–5.1)
Sodium: 131 mmol/L — ABNORMAL LOW (ref 135–145)

## 2015-08-25 MED ORDER — CEPHALEXIN 500 MG PO CAPS: 500.0000 mg | ORAL_CAPSULE | Freq: Three times a day (TID) | ORAL | Status: DC

## 2015-08-25 MED ORDER — LIDOCAINE HCL (PF) 1 % IJ SOLN
10.0000 mL | Freq: Once | INTRAMUSCULAR | Status: AC
  Administered 2015-08-25: 10 mL
  Filled 2015-08-25: qty 10

## 2015-08-25 MED ORDER — CEPHALEXIN 500 MG PO CAPS
500.0000 mg | ORAL_CAPSULE | Freq: Once | ORAL | Status: AC
  Administered 2015-08-25: 500 mg via ORAL
  Filled 2015-08-25: qty 1

## 2015-08-25 NOTE — ED Provider Notes (Signed)
[facility] Emergency Department Provider Note   ____________________________________________  Time seen: Approximately 2:31 PM  I have reviewed the triage vital signs and the nursing notes.   HISTORY  Chief Complaint Laceration    HPI Samantha Mcmillan is a 60 y.o. female is here with laceration to her left lower leg. Patient states she was up around 2 AM this morning in her house when her knee buckled and she fell. Patient states that she is recovering from a tibia fracture from 5/2. She states she also has problems with her electrolytes which also causes her to fall. Patient denies any syncopal episode and denies head injury during the fall. Patient states that she Steri-Stripped her laceration together as she did not want to call anyone to take her to the emergency room. Laceration is now approximately 12 hours old. Patient has a history of diabetes type 2 and is on oral medication.Patient states she has been seeing Dr. Mack Guise for her tibia fracture. She was just recently returned to walking with full weight prior to her injury this morning. Patient is up-to-date on her immunizations. Currently she rates her pain is 3 out of 10.   Past Medical History  Diagnosis Date  . GERD (gastroesophageal reflux disease)   . Colon polyp   . Cirrhosis of liver ([place])   . Hypertension   . Depression   . COPD (chronic obstructive pulmonary disease) ([place])   . IBS (irritable bowel syndrome)   . On home oxygen therapy     "2L; 24/7" (05/04/2014)  . Family history of adverse reaction to anesthesia     "my sister doesn't wake up good when she's put deep to sleep"  . High cholesterol   . Heart murmur   . Asthma   . Pneumonia "several times"  . Chronic bronchitis ([place])     "get it pretty much q yr"   . Sleep apnea     "can't tolerate CPAP" (05/04/2014)  . Type II diabetes mellitus ([place])   . History of blood transfusion     "related to my anemia"  . Anemia   .  Iron deficiency anemia   . Headache     "more than a couple times/wk" (05/04/2014)  . Arthritis     "some in my feet" (05/04/2014)  . Chronic lower back pain   . Anxiety   . Acute on chronic diastolic CHF (congestive heart failure) ([place]) 05/16/2014  . OCD (obsessive compulsive disorder)   . Panic attack   . PTSD (post-traumatic stress disorder)   . Chronic respiratory failure ([place])   . Cirrhosis of liver ([place])   . IDA (iron deficiency anemia) 08/14/2014  . Personal history of tobacco use, presenting hazards to health 06/21/2015    Patient Active Problem List   Diagnosis Date Noted  . ARF (acute renal failure) ([place]) 07/06/2015  . Hyperkalemia 07/06/2015  . Dehydration 07/06/2015  . Personal history of tobacco use, presenting hazards to health 06/21/2015  . Fall 04/16/2015  . Bacterial skin infection of leg 10/24/2014  . Edema leg 10/08/2014  . Hypertensive pulmonary vascular disease ([place]) 10/08/2014  . Chronic diastolic heart failure ([place]) 09/21/2014  . Tobacco use 09/21/2014  . Thrombocytopenia ([place]) 09/19/2014  . Leukopenia 09/19/2014  . IDA (iron deficiency anemia) 08/14/2014  . Ascites 07/25/2014  . Cellulitis and abscess of lower extremity 07/07/2014  . Encephalopathy, hepatic ([place]) 05/18/2014  . Polypharmacy 05/18/2014  . Failure to thrive in adult 05/16/2014  . Hepatic  cirrhosis ([place]) 05/16/2014  . Protein-calorie malnutrition, severe ([place]) 05/16/2014  . Advanced COPD ([place]) 05/16/2014  . Chronic back pain 05/16/2014  . Weakness 05/15/2014  . Hyponatremia 05/15/2014  . HCAP (healthcare-associated pneumonia) 05/09/2014  . Urinary retention 05/09/2014  . DM2 (diabetes mellitus, type 2) ([place]) 05/09/2014  . Hypothyroidism 05/05/2014    Past Surgical History  Procedure Laterality Date  . Cesarean section  1979  . Cataract extraction w/ intraocular lens  implant, bilateral Bilateral 2005  . Back surgery    . Colonoscopy  2014  . Upper gastrointestinal endoscopy    .  Posterior fusion cervical spine  2015    "rebuilt 3 of my neck vertebrae"  . Incision and drainage of wound Right 2009    "leg mauled  by dog"  . Tubal ligation  1979  . Dilation and curettage of uterus    . Peripheral vascular catheterization N/A 08/16/2014    Procedure: PICC Line Insertion;  Surgeon: Algernon Huxley, MD;  Location: [facility];  Service: Cardiovascular;  Laterality: N/A;    Current Outpatient Rx  Name  Route  Sig  Dispense  Refill  . albuterol (PROVENTIL HFA;VENTOLIN HFA) 108 (90 BASE) MCG/ACT inhaler   Inhalation   Inhale 2 puffs into the lungs every 4 (four) hours as needed for wheezing or shortness of breath.         Marland Kitchen albuterol (PROVENTIL) (2.5 MG/3ML) 0.083% nebulizer solution   Nebulization   Take 2.5 mg by nebulization every 6 (six) hours as needed for wheezing or shortness of breath.         . allopurinol (ZYLOPRIM) 100 MG tablet   Oral   Take 100 mg by mouth 2 (two) times daily.          . ARIPiprazole (ABILIFY) 5 MG tablet   Oral   Take 5 mg by mouth at bedtime.          Marland Kitchen atorvastatin (LIPITOR) 10 MG tablet   Oral   Take 10 mg by mouth at bedtime.          . budesonide-formoterol (SYMBICORT) 80-4.5 MCG/ACT inhaler   Inhalation   Inhale 2 puffs into the lungs 2 (two) times daily.          . bumetanide (BUMEX) 1 MG tablet   Oral   Take 1 mg by mouth 2 (two) times daily.         . Buprenorphine (BUTRANS) 15 MCG/HR PTWK   Transdermal   Place 20 mcg onto the skin once a week. Pt applies on Sunday.   1 patch   0   . cephALEXin (KEFLEX) 500 MG capsule   Oral   Take 1 capsule (500 mg total) by mouth 3 (three) times daily.   21 capsule   0   . cyclobenzaprine (FLEXERIL) 10 MG tablet   Oral   Take 10 mg by mouth 3 (three) times daily as needed for muscle spasms.          . ferrous fumarate-iron polysaccharide complex (TANDEM) 162-115.2 MG CAPS capsule   Oral   Take 1 capsule by mouth 2 (two) times daily after a meal.    60 capsule   6   . fluticasone (FLONASE) 50 MCG/ACT nasal spray   Each Nare   Place 1-2 sprays into both nostrils daily as needed for rhinitis.         Marland Kitchen gabapentin (NEURONTIN) 300 MG capsule   Oral   Take 300  mg by mouth at bedtime.          Marland Kitchen HYDROmorphone (DILAUDID) 2 MG tablet   Oral   Take 1 tablet (2 mg total) by mouth every 6 (six) hours as needed for severe pain.   30 tablet   0   . lactulose (CHRONULAC) 10 GM/15ML solution   Oral   Take 20 g by mouth 2 (two) times daily as needed for mild constipation.         Marland Kitchen lamoTRIgine (LAMICTAL) 150 MG tablet   Oral   Take 150 mg by mouth at bedtime.          Marland Kitchen levothyroxine (SYNTHROID, LEVOTHROID) 88 MCG tablet   Oral   Take 88 mcg by mouth daily before breakfast.          . magnesium oxide (MAG-OX) 400 MG tablet   Oral   Take 1 tablet (400 mg total) by mouth daily.   30 tablet   0   . metoCLOPramide (REGLAN) 10 MG tablet   Oral   Take 10 mg by mouth 4 (four) times daily as needed for nausea or vomiting.          . metoprolol tartrate (LOPRESSOR) 25 MG tablet   Oral   Take 25 mg by mouth 2 (two) times daily.         . Multiple Vitamin (MULTIVITAMIN WITH MINERALS) TABS tablet   Oral   Take 1 tablet by mouth daily.         . ondansetron (ZOFRAN) 4 MG tablet   Oral   Take 4 mg by mouth every 8 (eight) hours as needed for nausea or vomiting.          . pantoprazole (PROTONIX) 40 MG tablet   Oral   Take 40 mg by mouth daily.         Marland Kitchen senna-docusate (SENOKOT-S) 8.6-50 MG tablet   Oral   Take 1 tablet by mouth 2 (two) times daily.         . sertraline (ZOLOFT) 100 MG tablet   Oral   Take 100 mg by mouth 2 (two) times daily.            Allergies Fish allergy; Iodine; Latex; Percocet; Shellfish allergy; Amitriptyline; Tape; Wellbutrin; Augmentin; Codeine; Cortisone; Diphenhydramine; Other; and Vicodin  Family History  Problem Relation Age of Onset  . Cancer Father     pancreatic    . Cancer Brother   . Breast cancer Maternal Aunt     Social History Social History  Substance Use Topics  . Smoking status: Current Every Day Smoker -- 0.50 packs/day for 48 years    Types: Cigarettes  . Smokeless tobacco: Never Used  . Alcohol Use: No    Review of Systems Constitutional: No fever/chills Eyes: No visual changes. ENT: No trauma Cardiovascular: Denies chest pain. Respiratory: Denies shortness of breath. Gastrointestinal: No abdominal pain.  No nausea, no vomiting.   Musculoskeletal: Negative for back pain. Positive for left lower leg pain. Skin: Positive for laceration. Neurological: Negative for headaches, focal weakness or numbness.  10-point ROS otherwise negative.  ____________________________________________   PHYSICAL EXAM:  VITAL SIGNS: ED Triage Vitals  Enc Vitals Group     BP 08/25/15 1340 151/72 mmHg     Pulse Rate 08/25/15 1340 67     Resp 08/25/15 1340 16     Temp 08/25/15 1340 98.6 F (37 C)     Temp Source 08/25/15 1340 Oral  SpO2 08/25/15 1340 98 %     Weight 08/25/15 1340 180 lb (81.647 kg)     Height 08/25/15 1340 5\' 4"  (1.626 m)     Head Cir --      Peak Flow --      Pain Score 08/25/15 1342 3     Pain Loc --      Pain Edu? --      Excl. in [place]? --     Constitutional: Alert and oriented. Well appearing and in no acute distress. Eyes: Conjunctivae are normal. PERRL. EOMI. Head: Atraumatic. Nose: No congestion/rhinnorhea. Neck: No stridor.  No cervical tenderness on palpation posteriorly. Cardiovascular: Normal rate, regular rhythm. Grossly normal heart sounds.  Good peripheral circulation. Respiratory: Normal respiratory effort.  No retractions. Lungs CTAB. Gastrointestinal: Soft and nontender. No distention. Musculoskeletal: There is no gross deformity of the lower extremity however there is moderate tenderness on palpation of the mid tibia to the lateral aspect of the ankle. There is minimal soft tissue edema present.  Patient was able to walk to the emergency room with the use of a walker. There is some soft tissue edema bilateral lower extremities that patient states is her normal baseline. Neurologic:  Normal speech and language. No gross focal neurologic deficits are appreciated. Gait was not tested secondary to patient's injury. Skin:  Skin is warm, dry. There is a laceration to the left lower leg lateral aspect present without active bleeding. Edges do not approximate well. No foreign body was noted. Psychiatric: Mood and affect are normal. Speech and behavior are normal.  ____________________________________________   LABS (all labs ordered are listed, but only abnormal results are displayed)  Labs Reviewed  BASIC METABOLIC PANEL - Abnormal; Notable for the following:    Sodium 131 (*)    Chloride 95 (*)    Glucose, Bld 164 (*)    Calcium 8.4 (*)    All other components within normal limits  CBC WITH DIFFERENTIAL/PLATELET - Abnormal; Notable for the following:    WBC 3.2 (*)    RBC 3.61 (*)    Hemoglobin 11.4 (*)    HCT 33.6 (*)    RDW 15.2 (*)    Platelets 101 (*)    Lymphs Abs 0.7 (*)    All other components within normal limits     RADIOLOGY  Radiology plain the left tib fib shows subtle transverse fracture of the distal fibula at the level of the ankle mortise without displacement. There is also known healing fractures of the proximal medial tibia and proximal fibula. I, Johnn Hai, personally viewed and evaluated these images (plain radiographs) as part of my medical decision making, as well as reviewing the written report by the radiologist. ____________________________________________   PROCEDURES  Procedure(s) performed: LACERATION REPAIR Performed by: Johnn Hai Authorized by: Johnn Hai Consent: Verbal consent obtained. Risks and benefits: risks, benefits and alternatives were discussed Consent given by: patient Patient identity confirmed: provided  demographic data Prepped and Draped in normal sterile fashion Wound explored  Laceration Location: Left lower leg  Laceration Length: 7.5 cm  No Foreign Bodies seen or palpated  Anesthesia: local infiltration  Local anesthetic: lidocaine 1 % without epinephrine  Anesthetic total: 8.0 ml  Irrigation method: syringe Amount of cleaning: standard  Skin closure: 4-0 Ethilon   Number of sutures: 9  Technique: 6 simple interrupted sutures with 3 mattress sutures.   Patient tolerance: Patient tolerated the procedure well with no immediate complications.  Critical Care performed:  No  ____________________________________________   INITIAL IMPRESSION / ASSESSMENT AND PLAN / ED COURSE  Pertinent labs & imaging results that were available during my care of the patient were reviewed by me and considered in my medical decision making (see chart for details).  Patient was made aware that there is a fracture of the distal fibula and that immobilization of this area is needed. She is also to go back to nonweightbearing on her left leg until she sees Dr. Mack Guise. Patient was started on Keflex 500 mg for infection prevention since laceration has been open more than 12 hours. Patient is continue watching for signs of infection. She is call Dr. Harden Mo office tomorrow for an appointment.  Patient is also to follow-up with her PCP for follow-up of her blood work. ____________________________________________   FINAL CLINICAL IMPRESSION(S) / ED DIAGNOSES  Final diagnoses:  Laceration of left lower leg, initial encounter  Fracture of distal end of left fibula      NEW MEDICATIONS STARTED DURING THIS VISIT:  New Prescriptions   CEPHALEXIN (KEFLEX) 500 MG CAPSULE    Take 1 capsule (500 mg total) by mouth 3 (three) times daily.     Note:  This document was prepared using Dragon voice recognition software and may include unintentional dictation errors.    Johnn Hai,  PA-C 08/25/15 [place], MD 08/26/15 [phone]

## 2015-08-25 NOTE — ED Notes (Signed)
Pt states she had a tetanus shot in January 2017.

## 2015-08-25 NOTE — Discharge Instructions (Signed)
Contact Dr. Harden Mo office Monday for a follow-up appointment. No weightbearing on your left leg until seen by Dr. Mack Guise. You may continue wearing your knee brace that you currently have. Were ankle splint until seen by orthopedist. Ice and elevate to decrease swelling. Keflex is for infection prevention. Take your antibiotic 3 times a day until finished. Follow-up with your primary care doctor about your lab work. Today's blood sugar was in the 160s. Your platelets were decreased at 101 and should be rechecked again by your primary care doctor.

## 2015-08-25 NOTE — ED Notes (Signed)
Patient states she fell last night and cut left lower leg.  Patient states left knee buckled on her and then she fell at 0200 this morning.

## 2015-08-25 NOTE — ED Notes (Signed)
Patient states she was up around 0200 this morning walking in her house when her left knee buckled. Pt states she is recovering from a tibia fracture from 5/2.  Pt states she was admitted around Mother's day for low sodium and high potassium. Family member at bedside states when she starts falling that is when there is suspicion for electrolytes to be altered.  Laceration noticed to left lower leg. Bleeding is controlled at this time, but there is some clear fluid draining.  Pt does have pitting edema to bilateral extremities which patient states she has usually but is worse today.  Pt is ambulatory with rolling walker.

## 2015-08-25 NOTE — ED Notes (Signed)
Pt verbalized understanding of discharge instructions. NAD at this time. 

## 2015-08-29 ENCOUNTER — Other Ambulatory Visit: Payer: Self-pay | Admitting: Family Medicine

## 2015-09-25 ENCOUNTER — Telehealth: Payer: Self-pay | Admitting: *Deleted

## 2015-09-25 NOTE — Telephone Encounter (Signed)
Attempted x 2 in past two weeks to contact patient in attempt to reschedule lung cancer screening abnormality follow up scan. Patient in past was a no show when scheduled. Today, spoke with caregiver who reports the patient is not currently available. After stressing the importance of patient's follow up, the caregiver reports he will have her call me. Confirmed that caregiver has my phone number.

## 2015-09-27 ENCOUNTER — Other Ambulatory Visit: Payer: Self-pay | Admitting: Internal Medicine

## 2015-10-31 ENCOUNTER — Emergency Department
Admission: EM | Admit: 2015-10-31 | Discharge: 2015-10-31 | Disposition: A | Discharge status: Other Acute Inpatient Hospital | Payer: Medicare Other | Attending: Student in an Organized Health Care Education/Training Program | Admitting: Student in an Organized Health Care Education/Training Program

## 2015-10-31 ENCOUNTER — Emergency Department: Payer: Medicare Other

## 2015-10-31 ENCOUNTER — Encounter: Payer: Self-pay | Admitting: Emergency Medicine

## 2015-10-31 DIAGNOSIS — F17210 Nicotine dependence, cigarettes, uncomplicated: Secondary | ICD-10-CM | POA: Diagnosis not present

## 2015-10-31 DIAGNOSIS — I110 Hypertensive heart disease with heart failure: Secondary | ICD-10-CM | POA: Diagnosis not present

## 2015-10-31 DIAGNOSIS — Y929 Unspecified place or not applicable: Secondary | ICD-10-CM | POA: Diagnosis not present

## 2015-10-31 DIAGNOSIS — Y999 Unspecified external cause status: Secondary | ICD-10-CM | POA: Diagnosis not present

## 2015-10-31 DIAGNOSIS — E119 Type 2 diabetes mellitus without complications: Secondary | ICD-10-CM | POA: Diagnosis not present

## 2015-10-31 DIAGNOSIS — S12600A Unspecified displaced fracture of seventh cervical vertebra, initial encounter for closed fracture: Secondary | ICD-10-CM | POA: Diagnosis not present

## 2015-10-31 DIAGNOSIS — Z79899 Other long term (current) drug therapy: Secondary | ICD-10-CM | POA: Diagnosis not present

## 2015-10-31 DIAGNOSIS — J45909 Unspecified asthma, uncomplicated: Secondary | ICD-10-CM | POA: Insufficient documentation

## 2015-10-31 DIAGNOSIS — E039 Hypothyroidism, unspecified: Secondary | ICD-10-CM | POA: Diagnosis not present

## 2015-10-31 DIAGNOSIS — J449 Chronic obstructive pulmonary disease, unspecified: Secondary | ICD-10-CM | POA: Insufficient documentation

## 2015-10-31 DIAGNOSIS — R296 Repeated falls: Secondary | ICD-10-CM

## 2015-10-31 DIAGNOSIS — W1839XA Other fall on same level, initial encounter: Secondary | ICD-10-CM | POA: Insufficient documentation

## 2015-10-31 DIAGNOSIS — Z91040 Latex allergy status: Secondary | ICD-10-CM | POA: Insufficient documentation

## 2015-10-31 DIAGNOSIS — Y939 Activity, unspecified: Secondary | ICD-10-CM | POA: Diagnosis not present

## 2015-10-31 DIAGNOSIS — S299XXA Unspecified injury of thorax, initial encounter: Secondary | ICD-10-CM | POA: Diagnosis present

## 2015-10-31 DIAGNOSIS — I5032 Chronic diastolic (congestive) heart failure: Secondary | ICD-10-CM | POA: Diagnosis not present

## 2015-10-31 DIAGNOSIS — S129XXA Fracture of neck, unspecified, initial encounter: Secondary | ICD-10-CM

## 2015-10-31 LAB — CBC
HCT: 37.1 % (ref 35.0–47.0)
HEMOGLOBIN: 12.6 g/dL (ref 12.0–16.0)
MCH: 31.3 pg (ref 26.0–34.0)
MCHC: 34.1 g/dL (ref 32.0–36.0)
MCV: 91.8 fL (ref 80.0–100.0)
PLATELETS: 81 10*3/uL — AB (ref 150–440)
RBC: 4.04 MIL/uL (ref 3.80–5.20)
RDW: 16 % — ABNORMAL HIGH (ref 11.5–14.5)
WBC: 3.1 10*3/uL — AB (ref 3.6–11.0)

## 2015-10-31 LAB — BASIC METABOLIC PANEL
ANION GAP: 7 (ref 5–15)
BUN: 21 mg/dL — ABNORMAL HIGH (ref 6–20)
CALCIUM: 8.8 mg/dL — AB (ref 8.9–10.3)
CO2: 30 mmol/L (ref 22–32)
CREATININE: 1.09 mg/dL — AB (ref 0.44–1.00)
Chloride: 98 mmol/L — ABNORMAL LOW (ref 101–111)
GFR calc non Af Amer: 53 mL/min — ABNORMAL LOW (ref >60)
Glucose, Bld: 167 mg/dL — ABNORMAL HIGH (ref 65–99)
Potassium: 4.1 mmol/L (ref 3.5–5.1)
SODIUM: 135 mmol/L (ref 135–145)

## 2015-10-31 LAB — TROPONIN I

## 2015-10-31 MED ORDER — ACETAMINOPHEN 500 MG PO TABS
1000.0000 mg | ORAL_TABLET | Freq: Once | ORAL | Status: AC
  Administered 2015-10-31: 1000 mg via ORAL
  Filled 2015-10-31: qty 2

## 2015-10-31 NOTE — ED Notes (Signed)
Spoke to [facility] transport about pt's pending transfer.

## 2015-10-31 NOTE — ED Triage Notes (Addendum)
Patient presents to the ED with a fall on Saturday.  Patient reports history of multiple recent falls and recent fractures.  Patient states she fell flat on her face.  Patient states her arms and face took the brunt of the fall.  Patient reports neck pain post fall.  Patient has a morphine patch to her left arm.  Patient states, "I don't think I'm sleepy because of my morphine because I'm only taking it twice a day."  Patient is also complaining of bilateral shoulder pain from fall.  Patient is alert and oriented x 4.  No obvious distress at this time.

## 2015-10-31 NOTE — ED Notes (Signed)
Return from [place].  AAOx3.  NAD

## 2015-10-31 NOTE — ED Provider Notes (Signed)
[facility] Emergency Department Provider Note    First MD Initiated Contact with Patient 10/31/15 [phone]     (approximate)  I have reviewed the triage vital signs and the nursing notes.   HISTORY  Chief Complaint Fall    HPI Samantha Mcmillan is a 60 y.o. female with chronic back pain on transdermal narcotic medications presents for evaluation after falls in the morning. Patient states that she was getting up from the commode to her walker and lost balance prior to reaching the walker. Fell onto her face and caught most of the fall with her left upper extremity. Denies any loss of consciousness. Since then states that she has been having more unsteady gait and complaining of left upper extremity pain and neck pain. She states that she has felt sleepier than usual. No new fevers no new chest pain or shortness of breath. No lower extremity swelling.   Past Medical History:  Diagnosis Date  . Acute on chronic diastolic CHF (congestive heart failure) ([place]) 05/16/2014  . Anemia   . Anxiety   . Arthritis    "some in my feet" (05/04/2014)  . Asthma   . Chronic bronchitis ([place])    "get it pretty much q yr"   . Chronic lower back pain   . Chronic respiratory failure ([place])   . Cirrhosis of liver ([place])   . Cirrhosis of liver ([place])   . Colon polyp   . COPD (chronic obstructive pulmonary disease) ([place])   . Depression   . Family history of adverse reaction to anesthesia    "my sister doesn't wake up good when she's put deep to sleep"  . GERD (gastroesophageal reflux disease)   . Headache    "more than a couple times/wk" (05/04/2014)  . Heart murmur   . High cholesterol   . History of blood transfusion    "related to my anemia"  . Hypertension   . IBS (irritable bowel syndrome)   . IDA (iron deficiency anemia) 08/14/2014  . Iron deficiency anemia   . OCD (obsessive compulsive disorder)   . On home oxygen therapy    "2L; 24/7" (05/04/2014)  . Panic attack   .  Personal history of tobacco use, presenting hazards to health 06/21/2015  . Pneumonia "several times"  . PTSD (post-traumatic stress disorder)   . Sleep apnea    "can't tolerate CPAP" (05/04/2014)  . Type II diabetes mellitus [facility])     Patient Active Problem List   Diagnosis Date Noted  . ARF (acute renal failure) ([place]) 07/06/2015  . Hyperkalemia 07/06/2015  . Dehydration 07/06/2015  . Personal history of tobacco use, presenting hazards to health 06/21/2015  . Fall 04/16/2015  . Bacterial skin infection of leg 10/24/2014  . Edema leg 10/08/2014  . Hypertensive pulmonary vascular disease ([place]) 10/08/2014  . Chronic diastolic heart failure ([place]) 09/21/2014  . Tobacco use 09/21/2014  . Thrombocytopenia ([place]) 09/19/2014  . Leukopenia 09/19/2014  . IDA (iron deficiency anemia) 08/14/2014  . Ascites 07/25/2014  . Cellulitis and abscess of lower extremity 07/07/2014  . Encephalopathy, hepatic ([place]) 05/18/2014  . Polypharmacy 05/18/2014  . Failure to thrive in adult 05/16/2014  . Hepatic cirrhosis ([place]) 05/16/2014  . Protein-calorie malnutrition, severe ([place]) 05/16/2014  . Advanced COPD ([place]) 05/16/2014  . Chronic back pain 05/16/2014  . Weakness 05/15/2014  . Hyponatremia 05/15/2014  . HCAP (healthcare-associated pneumonia) 05/09/2014  . Urinary retention 05/09/2014  . DM2 (diabetes mellitus, type 2) ([place]) 05/09/2014  .  Hypothyroidism 05/05/2014    Past Surgical History:  Procedure Laterality Date  . BACK SURGERY    . CATARACT EXTRACTION W/ INTRAOCULAR LENS  IMPLANT, BILATERAL Bilateral 2005  . CESAREAN SECTION  1979  . COLONOSCOPY  2014  . DILATION AND CURETTAGE OF UTERUS    . INCISION AND DRAINAGE OF WOUND Right 2009   "leg mauled  by dog"  . PERIPHERAL VASCULAR CATHETERIZATION N/A 08/16/2014   Procedure: PICC Line Insertion;  Surgeon: Algernon Huxley, MD;  Location: [facility];  Service: Cardiovascular;  Laterality: N/A;  . POSTERIOR FUSION CERVICAL SPINE  2015    "rebuilt 3 of my neck vertebrae"  . TUBAL LIGATION  1979  . UPPER GASTROINTESTINAL ENDOSCOPY      Prior to Admission medications   Medication Sig Start Date End Date Taking? Authorizing Provider  albuterol (PROVENTIL HFA;VENTOLIN HFA) 108 (90 BASE) MCG/ACT inhaler Inhale 2 puffs into the lungs every 4 (four) hours as needed for wheezing or shortness of breath.    Historical Provider, MD  albuterol (PROVENTIL) (2.5 MG/3ML) 0.083% nebulizer solution Take 2.5 mg by nebulization every 6 (six) hours as needed for wheezing or shortness of breath.    Historical Provider, MD  allopurinol (ZYLOPRIM) 100 MG tablet Take 100 mg by mouth 2 (two) times daily.     Historical Provider, MD  ARIPiprazole (ABILIFY) 5 MG tablet Take 5 mg by mouth at bedtime.     Historical Provider, MD  atorvastatin (LIPITOR) 10 MG tablet Take 10 mg by mouth at bedtime.     Historical Provider, MD  budesonide-formoterol (SYMBICORT) 80-4.5 MCG/ACT inhaler Inhale 2 puffs into the lungs 2 (two) times daily.     Historical Provider, MD  bumetanide (BUMEX) 1 MG tablet Take 1 mg by mouth 2 (two) times daily. 07/04/15   Historical Provider, MD  Buprenorphine (BUTRANS) 15 MCG/HR PTWK Place 20 mcg onto the skin once a week. Pt applies on Sunday. 07/09/15   Samantha Costa, MD  cephALEXin (KEFLEX) 500 MG capsule Take 1 capsule (500 mg total) by mouth 3 (three) times daily. 08/25/15   Johnn Hai, PA-C  cyclobenzaprine (FLEXERIL) 10 MG tablet Take 10 mg by mouth 3 (three) times daily as needed for muscle spasms.     Historical Provider, MD  ferrous fumarate-iron polysaccharide complex (TANDEM) 162-115.2 MG CAPS capsule Take 1 capsule by mouth 2 (two) times daily after a meal. 08/07/15   Cammie Sickle, MD  fluticasone (FLONASE) 50 MCG/ACT nasal spray Place 1-2 sprays into both nostrils daily as needed for rhinitis.    Historical Provider, MD  gabapentin (NEURONTIN) 300 MG capsule Take 300 mg by mouth at bedtime.     Historical Provider, MD    HYDROmorphone (DILAUDID) 2 MG tablet Take 1 tablet (2 mg total) by mouth every 6 (six) hours as needed for severe pain. 07/09/15   Samantha Costa, MD  lactulose (CHRONULAC) 10 GM/15ML solution Take 20 g by mouth 2 (two) times daily as needed for mild constipation.    Historical Provider, MD  lamoTRIgine (LAMICTAL) 150 MG tablet Take 150 mg by mouth at bedtime.     Historical Provider, MD  levothyroxine (SYNTHROID, LEVOTHROID) 88 MCG tablet Take 88 mcg by mouth daily before breakfast.     Historical Provider, MD  magnesium oxide (MAG-OX) 400 MG tablet Take 1 tablet (400 mg total) by mouth daily. 07/09/15   Samantha Costa, MD  metoCLOPramide (REGLAN) 10 MG tablet Take 10 mg by mouth 4 (four)  times daily as needed for nausea or vomiting.     Historical Provider, MD  metoprolol tartrate (LOPRESSOR) 25 MG tablet Take 25 mg by mouth 2 (two) times daily.    Historical Provider, MD  Multiple Vitamin (MULTIVITAMIN WITH MINERALS) TABS tablet Take 1 tablet by mouth daily. 05/18/14   Christina P Rama, MD  ondansetron (ZOFRAN) 4 MG tablet Take 4 mg by mouth every 8 (eight) hours as needed for nausea or vomiting.     Historical Provider, MD  pantoprazole (PROTONIX) 40 MG tablet Take 40 mg by mouth daily.    Historical Provider, MD  senna-docusate (SENOKOT-S) 8.6-50 MG tablet Take 1 tablet by mouth 2 (two) times daily.    Historical Provider, MD  sertraline (ZOLOFT) 100 MG tablet Take 100 mg by mouth 2 (two) times daily.     Historical Provider, MD    Allergies Fish allergy; Iodine; Latex; Percocet [oxycodone-acetaminophen]; Shellfish allergy; Amitriptyline; Tape; Wellbutrin [bupropion]; Augmentin [amoxicillin-pot clavulanate]; Codeine; Cortisone; Diphenhydramine; Other; and Vicodin [hydrocodone-acetaminophen]  Family History  Problem Relation Age of Onset  . Cancer Father     pancreatic  . Cancer Brother   . Breast cancer Maternal Aunt     Social History Social History  Substance Use Topics  . Smoking status:  Current Every Day Smoker    Packs/day: 0.50    Years: 48.00    Types: Cigarettes  . Smokeless tobacco: Never Used  . Alcohol use No    Review of Systems Patient denies headaches, rhinorrhea, blurry vision, numbness, shortness of breath, chest pain, edema, cough, abdominal pain, nausea, vomiting, diarrhea, dysuria, fevers, rashes or hallucinations unless otherwise stated above in HPI. ____________________________________________   PHYSICAL EXAM:  VITAL SIGNS: Vitals:   10/31/15 1758  BP: (!) 185/80  Pulse: 88  Resp: 18  Temp: 98.2 F (36.8 C)    Constitutional: Alert and oriented. Well appearing and in no acute distress. Eyes: Conjunctivae are normal. PERRL. EOMI. Head: no battle signs or hemotympanum Nose: No congestion/rhinnorhea. Mouth/Throat: Mucous membranes are moist.  Oropharynx non-erythematous. Neck: No stridor. Posterior cervical spine surgical scar, pain with palpation, no step offs or deformities Hematological/Lymphatic/Immunilogical: No cervical lymphadenopathy. Cardiovascular: Normal rate, regular rhythm. Grossly normal heart sounds.  Good peripheral circulation. Respiratory: Normal respiratory effort.  No retractions. Lungs CTAB. Gastrointestinal: Soft and nontender. No distention. No abdominal bruits. No CVA tenderness. Genitourinary:  Musculoskeletal: LLE ankle in brace, no unilateral swelling or edema.  No T or L spine ttp Neurologic:  Normal speech and language. No gross focal neurologic deficits are appreciated. No gait instability.SILT distally, pain with palpation of left shoulder and medial condyle.  DTRs equal bilaterally, SILT distally.  No appreciable weakness Skin:  Skin is warm, dry and intact. No rash noted. Psychiatric: Mood and affect are normal. Speech and behavior are normal.  ____________________________________________   LABS (all labs ordered are listed, but only abnormal results are displayed)  Results for orders placed or performed  during the hospital encounter of 10/31/15 (from the past 24 hour(s))  Basic metabolic panel     Status: Abnormal   Collection Time: 10/31/15  6:07 PM  Result Value Ref Range   Sodium 135 135 - 145 mmol/L   Potassium 4.1 3.5 - 5.1 mmol/L   Chloride 98 (L) 101 - 111 mmol/L   CO2 30 22 - 32 mmol/L   Glucose, Bld 167 (H) 65 - 99 mg/dL   BUN 21 (H) 6 - 20 mg/dL   Creatinine, Ser 1.09 (H) 0.44 -  1.00 mg/dL   Calcium 8.8 (L) 8.9 - 10.3 mg/dL   GFR calc non Af Amer 53 (L) >60 mL/min   GFR calc Af Amer >60 >60 mL/min   Anion gap 7 5 - 15  CBC     Status: Abnormal   Collection Time: 10/31/15  6:07 PM  Result Value Ref Range   WBC 3.1 (L) 3.6 - 11.0 K/uL   RBC 4.04 3.80 - 5.20 MIL/uL   Hemoglobin 12.6 12.0 - 16.0 g/dL   HCT 37.1 35.0 - 47.0 %   MCV 91.8 80.0 - 100.0 fL   MCH 31.3 26.0 - 34.0 pg   MCHC 34.1 32.0 - 36.0 g/dL   RDW 16.0 (H) 11.5 - 14.5 %   Platelets 81 (L) 150 - 440 K/uL  Troponin I     Status: None   Collection Time: 10/31/15  6:07 PM  Result Value Ref Range   Troponin I <0.03 <0.03 ng/mL   ____________________________________________  EKG My review and personal interpretation at Time: 18:06   Indication: fatigue  Rate: 90  Rhythm: nsr Axis: normal Other: no acute ischemic changes ____________________________________________  RADIOLOGY CT head with NAICA CT cervical spine with IMPRESSION: 1. Acute or early subacute 45% compression fracture of the C7 vertebral body with 2 mm posterior bony retropulsion but no impingement. There is associated paraspinal edema. No subluxation. 2. Postoperative findings at the C3-C4-C5-C6 level without complicating feature. 3. Emphysema at the lung apices. 4. No acute intracranial findings. 5. Minimal chronic left maxillary sinusitis. ____________________________________________   PROCEDURES  Procedure(s) performed: none    Critical Care performed: no ____________________________________________   INITIAL IMPRESSION /  ASSESSMENT AND PLAN / ED COURSE  Pertinent labs & imaging results that were available during my care of the patient were reviewed by me and considered in my medical decision making (see chart for details).  DDX: sah, sdh, concussion, sprain, fracture  Samantha Mcmillan is a 60 y.o. who presents to the ED with chief complaint of neck pain and anterior chest wall pain status post fall from standing on Sunday. Patient having more frequent falls after recent increase of transdermal narcotic medications for chronic pain. Normally ambulates with a walker. No new numbness or tingling. Denies any loss of consciousness. Her abdominal exam is soft and benign. She has no hypoxia or lower extremity swelling to suggest acute congestive heart failure. Will order CT imaging to further characterize any acute traumatic injuries.  The patient will be placed on continuous pulse oximetry and telemetry for monitoring.  Laboratory evaluation will be sent to evaluate for the above complaints.     Clinical Course  Comment By Time  Blood work shows chronic renal insufficiency see without any evidence of acute kidney injury. She does have a gradually down trending white count as well as platelet levels as compared to previous. She is afebrile and does not appear to have any spontaneous hemorrhage.  Hemoglobin is stable Merlyn Lot, MD 09/07 2138  CT does show evidence of acute C7 compression fracture with 2 mm of posterior retropulsion. Hardware otherwise appears intact. Some these findings with increasing falls over the past several weeks to feel patient will require transfer to [facility] for neurosurgical evaluation Merlyn Lot, MD 09/07 2215   Patient reassessed remains hemodynamically stable. I spoke with Dr. Evelena Leyden as well as Dr. Milus Banister neurosurgery who agrees to accept patient for further evaluation and management.    ____________________________________________   FINAL CLINICAL IMPRESSION(S) / ED DIAGNOSES  Final  diagnoses:  Compression fracture of C-spine, initial encounter ([place])  Frequent falls      NEW MEDICATIONS STARTED DURING THIS VISIT:  New Prescriptions   No medications on file     Note:  This document was prepared using Dragon voice recognition software and may include unintentional dictation errors.    Merlyn Lot, MD 10/31/15 [phone]

## 2015-10-31 NOTE — ED Notes (Signed)
AAOx3.  Skin warm and dry.  C/O neck right chest, left upper arm and left shoulder pain.  No SOB/ DOE.

## 2015-11-01 DIAGNOSIS — S12601A Unspecified nondisplaced fracture of seventh cervical vertebra, initial encounter for closed fracture: Secondary | ICD-10-CM | POA: Insufficient documentation

## 2015-11-12 ENCOUNTER — Emergency Department
Admission: EM | Admit: 2015-11-12 | Discharge: 2015-11-13 | Disposition: A | Discharge status: Home / Self Care | Payer: Medicare Other | Attending: Emergency Medicine | Admitting: Emergency Medicine

## 2015-11-12 ENCOUNTER — Encounter: Payer: Self-pay | Admitting: Emergency Medicine

## 2015-11-12 ENCOUNTER — Emergency Department: Payer: Medicare Other

## 2015-11-12 DIAGNOSIS — J45909 Unspecified asthma, uncomplicated: Secondary | ICD-10-CM | POA: Insufficient documentation

## 2015-11-12 DIAGNOSIS — R110 Nausea: Secondary | ICD-10-CM | POA: Diagnosis present

## 2015-11-12 DIAGNOSIS — K8070 Calculus of gallbladder and bile duct without cholecystitis without obstruction: Secondary | ICD-10-CM | POA: Diagnosis not present

## 2015-11-12 DIAGNOSIS — I5033 Acute on chronic diastolic (congestive) heart failure: Secondary | ICD-10-CM | POA: Insufficient documentation

## 2015-11-12 DIAGNOSIS — I110 Hypertensive heart disease with heart failure: Secondary | ICD-10-CM | POA: Diagnosis not present

## 2015-11-12 DIAGNOSIS — E039 Hypothyroidism, unspecified: Secondary | ICD-10-CM | POA: Diagnosis not present

## 2015-11-12 DIAGNOSIS — E119 Type 2 diabetes mellitus without complications: Secondary | ICD-10-CM | POA: Insufficient documentation

## 2015-11-12 DIAGNOSIS — F17210 Nicotine dependence, cigarettes, uncomplicated: Secondary | ICD-10-CM | POA: Insufficient documentation

## 2015-11-12 DIAGNOSIS — J449 Chronic obstructive pulmonary disease, unspecified: Secondary | ICD-10-CM | POA: Insufficient documentation

## 2015-11-12 LAB — COMPREHENSIVE METABOLIC PANEL
ALBUMIN: 2.9 g/dL — AB (ref 3.5–5.0)
ALT: 60 U/L — ABNORMAL HIGH (ref 14–54)
ANION GAP: 6 (ref 5–15)
AST: 82 U/L — ABNORMAL HIGH (ref 15–41)
Alkaline Phosphatase: 248 U/L — ABNORMAL HIGH (ref 38–126)
BUN: 22 mg/dL — ABNORMAL HIGH (ref 6–20)
CO2: 29 mmol/L (ref 22–32)
Calcium: 8.5 mg/dL — ABNORMAL LOW (ref 8.9–10.3)
Chloride: 93 mmol/L — ABNORMAL LOW (ref 101–111)
Creatinine, Ser: 0.98 mg/dL (ref 0.44–1.00)
GFR calc Af Amer: >60 mL/min (ref >60)
GFR calc non Af Amer: >60 mL/min (ref >60)
GLUCOSE: 132 mg/dL — AB (ref 65–99)
POTASSIUM: 3.6 mmol/L (ref 3.5–5.1)
SODIUM: 128 mmol/L — AB (ref 135–145)
Total Bilirubin: 5.4 mg/dL — ABNORMAL HIGH (ref 0.3–1.2)
Total Protein: 6.6 g/dL (ref 6.5–8.1)

## 2015-11-12 LAB — URINALYSIS COMPLETE WITH MICROSCOPIC (ARMC ONLY)
Bacteria, UA: NONE SEEN
Bilirubin Urine: NEGATIVE
GLUCOSE, UA: NEGATIVE mg/dL
Ketones, ur: NEGATIVE mg/dL
NITRITE: NEGATIVE
PH: 7 (ref 5.0–8.0)
Protein, ur: 100 mg/dL — AB
SPECIFIC GRAVITY, URINE: 1.005 (ref 1.005–1.030)

## 2015-11-12 LAB — CBC
HEMATOCRIT: 36.7 % (ref 35.0–47.0)
HEMOGLOBIN: 12.4 g/dL (ref 12.0–16.0)
MCH: 31.2 pg (ref 26.0–34.0)
MCHC: 33.9 g/dL (ref 32.0–36.0)
MCV: 92 fL (ref 80.0–100.0)
Platelets: 78 10*3/uL — ABNORMAL LOW (ref 150–440)
RBC: 3.99 MIL/uL (ref 3.80–5.20)
RDW: 15.4 % — ABNORMAL HIGH (ref 11.5–14.5)
WBC: 6.1 10*3/uL (ref 3.6–11.0)

## 2015-11-12 LAB — LIPASE, BLOOD: Lipase: 189 U/L — ABNORMAL HIGH (ref 11–51)

## 2015-11-12 MED ORDER — SODIUM CHLORIDE 0.9 % IV BOLUS (SEPSIS)
1000.0000 mL | Freq: Once | INTRAVENOUS | Status: AC
  Administered 2015-11-12: 1000 mL via INTRAVENOUS

## 2015-11-12 MED ORDER — ONDANSETRON HCL 4 MG/2ML IJ SOLN
4.0000 mg | Freq: Once | INTRAMUSCULAR | Status: AC
  Administered 2015-11-12: 4 mg via INTRAVENOUS
  Filled 2015-11-12: qty 2

## 2015-11-12 MED ORDER — CIPROFLOXACIN IN D5W 400 MG/200ML IV SOLN
400.0000 mg | Freq: Once | INTRAVENOUS | Status: AC
  Administered 2015-11-13: 400 mg via INTRAVENOUS
  Filled 2015-11-12: qty 200

## 2015-11-12 MED ORDER — METRONIDAZOLE IN NACL 5-0.79 MG/ML-% IV SOLN: 500.0000 mg | Freq: Once | INTRAVENOUS | Status: DC

## 2015-11-12 MED ORDER — MORPHINE SULFATE (PF) 4 MG/ML IV SOLN
4.0000 mg | Freq: Once | INTRAVENOUS | Status: AC
  Administered 2015-11-12: 4 mg via INTRAVENOUS
  Filled 2015-11-12: qty 1

## 2015-11-12 NOTE — ED Provider Notes (Signed)
-----------------------------------------   8:30 PM on 11/12/2015 -----------------------------------------   Blood pressure (!) 168/81, pulse 81, temperature 98.1 F (36.7 C), temperature source Oral, resp. rate 16, height '5\' 4"'$  (1.626 m), weight 177 lb (80.3 kg), SpO2 97 %.  Assuming care from Dr. Kerman Passey of [facility] is a 60 y.o. female with a chief complaint of Emesis and Headache .    In summary, 60 year old female with a history of CHF, cirrhosis of the liver, DM, COPD, HTN, HLD who presents for evaluation of upper abdominal pain, nausea, generalized fatigue, weakness, and anorexia x 4 days.  VS WNL. Labs showing elevated LFTs, Tbili 5.4, elevated lipase and alk phosp. Normal WBC. RUQ [place] pending.   The current plan of care is to f/u results of [place].   _________________________ 11:53 PM on 11/12/2015 -----------------------------------------  RUQ US showing sludge and stones but normal common bile duct and no evidence of cholecystitis. Spoke with the hospitalist who recommended transfer to a facility with capabilities of ERCP as they're not sure if the GI doctor on-call tomorrow is able to perform this service. I page our GI doctor who will be on call tomorrow but no answer. Spoke with [employer] and both on diversion. Spoke with [facility] and they report that they will not have a bed available for this patient in the next 12-24 hours. I spoke with Dr. Cristina Gong at [facility] who recommended giving patient IV antibiotics and pursuing an MRCP in the morning and if the MRCP shows an obstruction of the common bile duct then they would be willing to accept the patient as a transfer tomorrow as they have no beds at this time and patient is hemodynamically stable. I spoke again with the hospitalist service who does not feel comfortable admitting this patient due to concerns the patient can develop ascending cholangitis at any time and they would not be able to care for the patient.   I have  started patient on abx and requested that VS be checked on the patient q1hr. I will transfer patient to Dr. Edd Fabian at this time pending MRCP in the am and if patient has obstruction, [facility] will then accept transfer.   Rudene Re, MD 11/13/15 [phone]

## 2015-11-12 NOTE — ED Notes (Signed)
Patient transported to Ultrasound 

## 2015-11-12 NOTE — ED Triage Notes (Signed)
Has not been feeling well since released from hospital (11/02/15).  Patient admitted to [facility] for observation for a C-7 fracture.  C/O headaches, nausea, vomiting.

## 2015-11-12 NOTE — ED Provider Notes (Signed)
[facility] Emergency Department Provider Note  Time seen: 7:54 PM  I have reviewed the triage vital signs and the nursing notes.   HISTORY  Chief Complaint Emesis and Headache    HPI Samantha Mcmillan is a 60 y.o. female presents to the emergency department with nausea, generalized fatigue, weakness and lack of appetite. According to the patient for the past 3-4 days she has had no appetite, has not been eating, drinking very little has felt very nauseated and experiencing upper abdominal pain. Denies vomiting but states she feels very nauseous. Denies diarrhea. Denies fever. Denies chest pain. Describes abdominal pain as moderate located mostly in the upper abdomen. States EKGs will have some lower abdominal pain as well. Describes as an aching pain.  Past Medical History:  Diagnosis Date  . Acute on chronic diastolic CHF (congestive heart failure) ([place]) 05/16/2014  . Anemia   . Anxiety   . Arthritis    "some in my feet" (05/04/2014)  . Asthma   . Chronic bronchitis ([place])    "get it pretty much q yr"   . Chronic lower back pain   . Chronic respiratory failure ([place])   . Cirrhosis of liver ([place])   . Cirrhosis of liver ([place])   . Colon polyp   . COPD (chronic obstructive pulmonary disease) ([place])   . Depression   . Family history of adverse reaction to anesthesia    "my sister doesn't wake up good when she's put deep to sleep"  . GERD (gastroesophageal reflux disease)   . Headache    "more than a couple times/wk" (05/04/2014)  . Heart murmur   . High cholesterol   . History of blood transfusion    "related to my anemia"  . Hypertension   . IBS (irritable bowel syndrome)   . IDA (iron deficiency anemia) 08/14/2014  . Iron deficiency anemia   . OCD (obsessive compulsive disorder)   . On home oxygen therapy    "2L; 24/7" (05/04/2014)  . Panic attack   . Personal history of tobacco use, presenting hazards to health 06/21/2015  . Pneumonia "several times"   . PTSD (post-traumatic stress disorder)   . Sleep apnea    "can't tolerate CPAP" (05/04/2014)  . Type II diabetes mellitus [facility])     Patient Active Problem List   Diagnosis Date Noted  . ARF (acute renal failure) ([place]) 07/06/2015  . Hyperkalemia 07/06/2015  . Dehydration 07/06/2015  . Personal history of tobacco use, presenting hazards to health 06/21/2015  . Fall 04/16/2015  . Bacterial skin infection of leg 10/24/2014  . Edema leg 10/08/2014  . Hypertensive pulmonary vascular disease ([place]) 10/08/2014  . Chronic diastolic heart failure ([place]) 09/21/2014  . Tobacco use 09/21/2014  . Thrombocytopenia ([place]) 09/19/2014  . Leukopenia 09/19/2014  . IDA (iron deficiency anemia) 08/14/2014  . Ascites 07/25/2014  . Cellulitis and abscess of lower extremity 07/07/2014  . Encephalopathy, hepatic ([place]) 05/18/2014  . Polypharmacy 05/18/2014  . Failure to thrive in adult 05/16/2014  . Hepatic cirrhosis ([place]) 05/16/2014  . Protein-calorie malnutrition, severe ([place]) 05/16/2014  . Advanced COPD ([place]) 05/16/2014  . Chronic back pain 05/16/2014  . Weakness 05/15/2014  . Hyponatremia 05/15/2014  . HCAP (healthcare-associated pneumonia) 05/09/2014  . Urinary retention 05/09/2014  . DM2 (diabetes mellitus, type 2) ([place]) 05/09/2014  . Hypothyroidism 05/05/2014    Past Surgical History:  Procedure Laterality Date  . BACK SURGERY    . CATARACT EXTRACTION W/ INTRAOCULAR LENS  IMPLANT, BILATERAL  Bilateral 2005  . CESAREAN SECTION  1979  . COLONOSCOPY  2014  . DILATION AND CURETTAGE OF UTERUS    . INCISION AND DRAINAGE OF WOUND Right 2009   "leg mauled  by dog"  . PERIPHERAL VASCULAR CATHETERIZATION N/A 08/16/2014   Procedure: PICC Line Insertion;  Surgeon: Algernon Huxley, MD;  Location: [place] [facility];  Service: Cardiovascular;  Laterality: N/A;  . POSTERIOR FUSION CERVICAL SPINE  2015   "rebuilt 3 of my neck vertebrae"  . TUBAL LIGATION  1979  . UPPER GASTROINTESTINAL ENDOSCOPY       Prior to Admission medications   Medication Sig Start Date End Date Taking? Authorizing Provider  albuterol (PROVENTIL HFA;VENTOLIN HFA) 108 (90 BASE) MCG/ACT inhaler Inhale 2 puffs into the lungs every 4 (four) hours as needed for wheezing or shortness of breath.    Historical Provider, MD  albuterol (PROVENTIL) (2.5 MG/3ML) 0.083% nebulizer solution Take 2.5 mg by nebulization every 6 (six) hours as needed for wheezing or shortness of breath.    Historical Provider, MD  allopurinol (ZYLOPRIM) 100 MG tablet Take 100 mg by mouth 2 (two) times daily.     Historical Provider, MD  ARIPiprazole (ABILIFY) 5 MG tablet Take 5 mg by mouth at bedtime.     Historical Provider, MD  atorvastatin (LIPITOR) 10 MG tablet Take 10 mg by mouth at bedtime.     Historical Provider, MD  budesonide-formoterol (SYMBICORT) 80-4.5 MCG/ACT inhaler Inhale 2 puffs into the lungs 2 (two) times daily.     Historical Provider, MD  bumetanide (BUMEX) 1 MG tablet Take 1 mg by mouth 2 (two) times daily. 07/04/15   Historical Provider, MD  Buprenorphine (BUTRANS) 15 MCG/HR PTWK Place 20 mcg onto the skin once a week. Pt applies on Sunday. 07/09/15   Bettey Costa, MD  cephALEXin (KEFLEX) 500 MG capsule Take 1 capsule (500 mg total) by mouth 3 (three) times daily. 08/25/15   Johnn Hai, PA-C  cyclobenzaprine (FLEXERIL) 10 MG tablet Take 10 mg by mouth 3 (three) times daily as needed for muscle spasms.     Historical Provider, MD  ferrous fumarate-iron polysaccharide complex (TANDEM) 162-115.2 MG CAPS capsule Take 1 capsule by mouth 2 (two) times daily after a meal. 08/07/15   Cammie Sickle, MD  fluticasone (FLONASE) 50 MCG/ACT nasal spray Place 1-2 sprays into both nostrils daily as needed for rhinitis.    Historical Provider, MD  gabapentin (NEURONTIN) 300 MG capsule Take 300 mg by mouth at bedtime.     Historical Provider, MD  HYDROmorphone (DILAUDID) 2 MG tablet Take 1 tablet (2 mg total) by mouth every 6 (six) hours as  needed for severe pain. 07/09/15   Bettey Costa, MD  lactulose (CHRONULAC) 10 GM/15ML solution Take 20 g by mouth 2 (two) times daily as needed for mild constipation.    Historical Provider, MD  lamoTRIgine (LAMICTAL) 150 MG tablet Take 150 mg by mouth at bedtime.     Historical Provider, MD  levothyroxine (SYNTHROID, LEVOTHROID) 88 MCG tablet Take 88 mcg by mouth daily before breakfast.     Historical Provider, MD  magnesium oxide (MAG-OX) 400 MG tablet Take 1 tablet (400 mg total) by mouth daily. 07/09/15   Bettey Costa, MD  metoCLOPramide (REGLAN) 10 MG tablet Take 10 mg by mouth 4 (four) times daily as needed for nausea or vomiting.     Historical Provider, MD  metoprolol tartrate (LOPRESSOR) 25 MG tablet Take 25 mg by mouth 2 (two)  times daily.    Historical Provider, MD  Multiple Vitamin (MULTIVITAMIN WITH MINERALS) TABS tablet Take 1 tablet by mouth daily. 05/18/14   Christina P Rama, MD  ondansetron (ZOFRAN) 4 MG tablet Take 4 mg by mouth every 8 (eight) hours as needed for nausea or vomiting.     Historical Provider, MD  pantoprazole (PROTONIX) 40 MG tablet Take 40 mg by mouth daily.    Historical Provider, MD  senna-docusate (SENOKOT-S) 8.6-50 MG tablet Take 1 tablet by mouth 2 (two) times daily.    Historical Provider, MD  sertraline (ZOLOFT) 100 MG tablet Take 100 mg by mouth 2 (two) times daily.     Historical Provider, MD    Allergies  Allergen Reactions  . Fish Allergy Anaphylaxis and Swelling  . Iodine Shortness Of Breath  . Latex Anaphylaxis  . Percocet [Oxycodone-Acetaminophen] Shortness Of Breath  . Shellfish Allergy Anaphylaxis and Swelling  . Amitriptyline Other (See Comments)    Reaction:  Mental changes   . Tape Other (See Comments)    Pt states that it pulls her skin off.   . Wellbutrin [Bupropion] Other (See Comments)    Reaction:  Mental changes   . Augmentin [Amoxicillin-Pot Clavulanate] Rash and Other (See Comments)    Has patient had a PCN reaction causing immediate  rash, facial/tongue/throat swelling, SOB or lightheadedness with hypotension: No Has patient had a PCN reaction causing severe rash involving mucus membranes or skin necrosis: No Has patient had a PCN reaction that required hospitalization No Has patient had a PCN reaction occurring within the last 10 years: No If all of the above answers are "NO", then may proceed with Cephalosporin use.  . Codeine Rash  . Cortisone Rash  . Diphenhydramine Rash  . Other Rash and Other (See Comments)    Pt states that she is allergic to Darvocet.   . Vicodin [Hydrocodone-Acetaminophen] Rash and Other (See Comments)    Reaction:  Dizziness      Family History  Problem Relation Age of Onset  . Cancer Father     pancreatic  . Cancer Brother   . Breast cancer Maternal Aunt     Social History Social History  Substance Use Topics  . Smoking status: Current Every Day Smoker    Packs/day: 0.50    Years: 48.00    Types: Cigarettes  . Smokeless tobacco: Never Used  . Alcohol use No    Review of Systems Constitutional: Negative for fever Cardiovascular: Negative for chest pain. Respiratory: Negative for shortness of breath. Gastrointestinal: Positive for upper abdominal pain. Positive for nausea. Genitourinary: Negative for dysuria. Musculoskeletal: Negative for back pain Neurological: Negative for headache 10-point ROS otherwise negative.  ____________________________________________   PHYSICAL EXAM:  VITAL SIGNS: ED Triage Vitals  Enc Vitals Group     BP 11/12/15 1837 (!) 169/70     Pulse Rate 11/12/15 1837 80     Resp 11/12/15 1835 16     Temp 11/12/15 1835 98.1 F (36.7 C)     Temp Source 11/12/15 1835 Oral     SpO2 11/12/15 1835 97 %     Weight 11/12/15 1836 177 lb (80.3 kg)     Height 11/12/15 1836 5\' 4"  (1.626 m)     Head Circumference --      Peak Flow --      Pain Score 11/12/15 1836 5     Pain Loc --      Pain Edu? --      Excl. in  GC? --     Constitutional: Alert  and oriented. Well appearing and in no distress. Eyes: Mild scleral icterus. ENT   Head: Normocephalic and atraumatic.Patient in a [place] J collar, recent C-spine fracture.   Mouth/Throat: Mucous membranes are moist. Cardiovascular: Normal rate, regular rhythm.  Respiratory: Normal respiratory effort without tachypnea nor retractions. Breath sounds are clear Gastrointestinal: Soft, moderate epigastric and right upper quadrant tenderness. No rebound or guarding. No distention. Mild suprapubic tenderness. Musculoskeletal: Nontender with normal range of motion in all extremities. Neurologic:  Normal speech and language. No gross focal neurologic deficits  Skin:  Skin is warm, dry and intact.  Psychiatric: Mood and affect are normal. Speech and behavior are normal.   ____________________________________________   RADIOLOGY  [place] pending  ____________________________________________   INITIAL IMPRESSION / ASSESSMENT AND PLAN / ED COURSE  Pertinent labs & imaging results that were available during my care of the patient were reviewed by me and considered in my medical decision making (see chart for details).  The patient presents to the emergency department with upper abdominal pain. Patient's labs are suggestive of biliary obstruction. Patient states a history of liver cirrhosis, still has her gallbladder. In reviewing the patient's labs she has a low sodium level which is fairly baseline for the patient. Patient appeared to have recent LFTs which were normal, now significantly elevated along with an elevated bilirubin and lipase. We'll proceed with an ultrasound of the right upper quadrant further evaluate. Patient was recently seen at [facility] 2 weeks ago for a C-spine fracture. She states if she needs to be transferred. Refer to be transferred to [facility].  We will treat with pain and nausea medication, IV hydrate and obtain a right upper quadrant ultrasound. Patient care signed out to  Dr. Alfred Levins ultrasound pending.  ____________________________________________   FINAL CLINICAL IMPRESSION(S) / ED DIAGNOSES  Biliary obstruction Abdominal pain Generalized weakness    Harvest Dark, MD 11/12/15 2000

## 2015-11-12 NOTE — ED Notes (Signed)
Pt in via triage with complaints of not feeling well since Sunday, states right quadrant abdominal pain w/ N/V.  Pt A/Ox4, in c-collar due to C7 fx which she has been seen for last week.  Pt vitals WDL, no immediate distress noted at this time.

## 2015-11-12 NOTE — ED Notes (Signed)
Pt oxygen saturation 86% on room air while sleeping.  Pt placed on 2L nasal cannula to maintain sat >92%.

## 2015-11-13 DIAGNOSIS — R17 Unspecified jaundice: Secondary | ICD-10-CM | POA: Insufficient documentation

## 2015-11-13 DIAGNOSIS — I10 Essential (primary) hypertension: Secondary | ICD-10-CM | POA: Insufficient documentation

## 2015-11-13 DIAGNOSIS — R109 Unspecified abdominal pain: Secondary | ICD-10-CM | POA: Insufficient documentation

## 2015-11-13 DIAGNOSIS — S12601D Unspecified nondisplaced fracture of seventh cervical vertebra, subsequent encounter for fracture with routine healing: Secondary | ICD-10-CM | POA: Insufficient documentation

## 2015-11-13 DIAGNOSIS — G8929 Other chronic pain: Secondary | ICD-10-CM | POA: Insufficient documentation

## 2015-11-13 DIAGNOSIS — K851 Biliary acute pancreatitis without necrosis or infection: Secondary | ICD-10-CM | POA: Insufficient documentation

## 2015-11-13 MED ORDER — ONDANSETRON HCL 4 MG/2ML IJ SOLN
4.0000 mg | Freq: Once | INTRAMUSCULAR | Status: AC
  Administered 2015-11-13: 4 mg via INTRAVENOUS

## 2015-11-13 MED ORDER — SODIUM CHLORIDE 0.9 % IV SOLN
INTRAVENOUS | Status: DC
  Administered 2015-11-13: 01:00:00 via INTRAVENOUS

## 2015-11-13 MED ORDER — MORPHINE SULFATE (PF) 4 MG/ML IV SOLN
4.0000 mg | Freq: Once | INTRAVENOUS | Status: AC
  Administered 2015-11-13: 4 mg via INTRAVENOUS
  Filled 2015-11-13: qty 1

## 2015-11-13 NOTE — ED Provider Notes (Signed)
-----------------------------------------   12:37 AM on 11/13/2015 -----------------------------------------  I assume care from Dr. Alfred Levins at approximately 12 AM. Briefly this is a patient presenting with upper abdominal pain with elevated LFTs, lipase and T bili concerning for symptomatic choledocholithiasis with concern for a common bile duct obstruction. Ultrasound showed multiple gallstones however no common bile duct dilated dictation. I discussed the case with Dr. Madelyn Brunner of [facility] who will accept transfer given that we have no GI coverage tomorrow according to the updated amion schedule. I discussed this with the patient. She is resting comfortably.   Joanne Gavel, MD 11/13/15 [phone]

## 2015-11-13 NOTE — ED Notes (Signed)
EMS here to take patient to [facility].  Janett Billow, RN at [facility] notified of pt's departure and updated with recent vitals and medications given before departures.

## 2015-12-13 ENCOUNTER — Telehealth: Payer: Self-pay | Admitting: *Deleted

## 2015-12-13 NOTE — Telephone Encounter (Signed)
error 

## 2015-12-13 NOTE — Telephone Encounter (Signed)
Follow up imaging from low dose CT lung cancer screening has been due. Despite multiple attempts at all contact numbers available, as well as no show appointment, have not been able to arrange for CT scan. Letter mailed to patient in final attempt to contact patient. I will be happy to assist in the future if patient so desires. Will forward to referring provider/ PCP.

## 2015-12-26 ENCOUNTER — Ambulatory Visit (INDEPENDENT_AMBULATORY_CARE_PROVIDER_SITE_OTHER): Payer: Medicare Other | Admitting: Gastroenterology

## 2015-12-26 ENCOUNTER — Encounter: Payer: Self-pay | Admitting: Gastroenterology

## 2015-12-26 ENCOUNTER — Other Ambulatory Visit: Payer: Self-pay

## 2015-12-26 VITALS — BP 196/79 | HR 68 | Temp 98.1°F | Ht 64.0 in | Wt 194.0 lb

## 2015-12-26 DIAGNOSIS — R188 Other ascites: Principal | ICD-10-CM

## 2015-12-26 DIAGNOSIS — K7460 Unspecified cirrhosis of liver: Secondary | ICD-10-CM | POA: Diagnosis not present

## 2015-12-26 NOTE — Progress Notes (Addendum)
Primary Care Physician: Casilda Carls  Primary Gastroenterologist:  Dr. Lucilla Lame  Chief Complaint  Patient presents with  . Other    Established patient- Elevated Liver enzymes    HPI: Samantha Mcmillan is a 60 y.o. female here after being found to have abnormal liver enzymes area the patient has been found to have nonalcoholic fatty liver disease. The patient reports that her up to have abdomen being more distended in her lower extremities to be more swollen. She reports that she is unable to take Aldactone or Lasix because of sodium and she is only able to take Bumex. The patient has been taking Bumex Mariel Kansky a day when she has problems. The patient is also concerned that her blood pressures up area      Current Outpatient Prescriptions  Medication Sig Dispense Refill  . albuterol (PROVENTIL HFA;VENTOLIN HFA) 108 (90 BASE) MCG/ACT inhaler Inhale 2 puffs into the lungs every 4 (four) hours as needed for wheezing or shortness of breath.    Marland Kitchen albuterol (PROVENTIL) (2.5 MG/3ML) 0.083% nebulizer solution Take 2.5 mg by nebulization every 6 (six) hours as needed for wheezing or shortness of breath.    . allopurinol (ZYLOPRIM) 100 MG tablet Take 100 mg by mouth 2 (two) times daily.     . ARIPiprazole (ABILIFY) 5 MG tablet Take 5 mg by mouth at bedtime.     Marland Kitchen atorvastatin (LIPITOR) 10 MG tablet Take 10 mg by mouth at bedtime.     . budesonide-formoterol (SYMBICORT) 80-4.5 MCG/ACT inhaler Inhale 2 puffs into the lungs 2 (two) times daily.     . bumetanide (BUMEX) 1 MG tablet Take 1 mg by mouth 2 (two) times daily.    . Buprenorphine (BUTRANS) 15 MCG/HR PTWK Place 20 mcg onto the skin once a week. Pt applies on Sunday. 1 patch 0  . ferrous fumarate-iron polysaccharide complex (TANDEM) 162-115.2 MG CAPS capsule Take 1 capsule by mouth 2 (two) times daily after a meal. 60 capsule 6  . fluticasone (FLONASE) 50 MCG/ACT nasal spray Place 1-2 sprays into both nostrils daily as needed for rhinitis.      Marland Kitchen gabapentin (NEURONTIN) 300 MG capsule Take 300 mg by mouth at bedtime.     Marland Kitchen HYDROmorphone (DILAUDID) 2 MG tablet Take 1 tablet (2 mg total) by mouth every 6 (six) hours as needed for severe pain. 30 tablet 0  . lactulose (CHRONULAC) 10 GM/15ML solution Take 20 g by mouth 2 (two) times daily as needed for mild constipation.    Marland Kitchen lamoTRIgine (LAMICTAL) 150 MG tablet Take 150 mg by mouth at bedtime.     Marland Kitchen levothyroxine (SYNTHROID, LEVOTHROID) 88 MCG tablet Take 88 mcg by mouth daily before breakfast.     . metoCLOPramide (REGLAN) 10 MG tablet Take 10 mg by mouth 4 (four) times daily as needed for nausea or vomiting.     . metoprolol tartrate (LOPRESSOR) 25 MG tablet Take 25 mg by mouth 2 (two) times daily.    . Multiple Vitamin (MULTIVITAMIN WITH MINERALS) TABS tablet Take 1 tablet by mouth daily.    . ondansetron (ZOFRAN) 4 MG tablet Take 4 mg by mouth every 8 (eight) hours as needed for nausea or vomiting.     . pantoprazole (PROTONIX) 40 MG tablet Take 40 mg by mouth daily.    Marland Kitchen senna-docusate (SENOKOT-S) 8.6-50 MG tablet Take 1 tablet by mouth 2 (two) times daily.    . sertraline (ZOLOFT) 100 MG tablet Take 100 mg by mouth 2 (  two) times daily.      No current facility-administered medications for this visit.     Allergies as of 12/26/2015 - Review Complete 12/26/2015  Allergen Reaction Noted  . Fish allergy Anaphylaxis and Swelling 06/25/2014  . Iodine Shortness Of Breath 03/15/2014  . Latex Anaphylaxis 03/15/2014  . Other Rash, Other (See Comments), Shortness Of Breath, Anaphylaxis, and Swelling 03/15/2014  . Oxycodone-acetaminophen Other (See Comments), Rash, Swelling, and Shortness Of Breath 03/07/2013  . Percocet [oxycodone-acetaminophen] Shortness Of Breath 03/15/2014  . Shellfish allergy Anaphylaxis and Swelling 03/15/2014  . Shellfish-derived products Anaphylaxis, Rash, Shortness Of Breath, and Swelling 03/28/2013  . Amitriptyline Other (See Comments) 03/07/2013  .  Fish-derived products Other (See Comments) and Swelling 08/02/2014  . Tape Other (See Comments) 06/25/2014  . Wellbutrin [bupropion] Other (See Comments) 03/15/2014  . Augmentin [amoxicillin-pot clavulanate] Rash and Other (See Comments) 03/15/2014  . Codeine Rash 03/15/2014  . Cortisone Rash 03/15/2014  . Diphenhydramine Rash 03/15/2014  . Vicodin [hydrocodone-acetaminophen] Rash and Other (See Comments) 03/15/2014    ROS:  General: Negative for anorexia, weight loss, fever, chills, fatigue, weakness. ENT: Negative for hoarseness, difficulty swallowing , nasal congestion. CV: Negative for chest pain, angina, palpitations, dyspnea on exertion, peripheral edema.  Respiratory: Negative for dyspnea at rest, dyspnea on exertion, cough, sputum, wheezing.  GI: See history of present illness. GU:  Negative for dysuria, hematuria, urinary incontinence, urinary frequency, nocturnal urination.  Endo: Negative for unusual weight change.    Physical Examination:   BP (!) 196/79   Pulse 68   Temp 98.1 F (36.7 C) (Oral)   Ht 5\' 4"  (1.626 m)   Wt 194 lb (88 kg)   LMP  (LMP Unknown)   BMI 33.30 kg/m   General: Well-nourished, well-developed in no acute distress.  Eyes: No icterus. Conjunctivae pink. Mouth: Oropharyngeal mucosa moist and pink , no lesions erythema or exudate. Lungs: Clear to auscultation bilaterally. Non-labored. Heart: Regular rate and rhythm, no murmurs rubs or gallops.  Abdomen: Soft non-tender slightly distended positive ascites Psych: Alert and cooperative, normal mood and affect.  Labs:    Imaging Studies: No results found.  Assessment and Plan:   Samantha Mcmillan is a 60 y.o. y/o female has cirrhosis and ascites with elevated liver enzymes. The patient may have increased passive congestion of the liver causing the abnormal liver enzymes and her underlying nonalcoholic fatty liver disease.  The patient reports that she is going to go to the ER today because she  is not feeling well and because her blood pressure is high. I believe following her liver enzymes after she gets her fluid overload treated will give [place] a better baseline of what may be causing her increase in liver enzymes.    Lucilla Lame, MD. Marval Regal   Note: This dictation was prepared with Dragon dictation along with smaller phrase technology. Any transcriptional errors that result from this process are unintentional.

## 2015-12-27 ENCOUNTER — Inpatient Hospital Stay
Admission: EM | Admit: 2015-12-27 | Discharge: 2015-12-30 | DRG: 293 | Disposition: A | Discharge status: Home Health | Payer: Medicare Other | Attending: Internal Medicine | Admitting: Internal Medicine

## 2015-12-27 ENCOUNTER — Encounter: Payer: Self-pay | Admitting: Emergency Medicine

## 2015-12-27 ENCOUNTER — Emergency Department: Payer: Medicare Other

## 2015-12-27 DIAGNOSIS — E039 Hypothyroidism, unspecified: Secondary | ICD-10-CM | POA: Diagnosis not present

## 2015-12-27 DIAGNOSIS — F329 Major depressive disorder, single episode, unspecified: Secondary | ICD-10-CM | POA: Diagnosis present

## 2015-12-27 DIAGNOSIS — F410 Panic disorder [episodic paroxysmal anxiety] without agoraphobia: Secondary | ICD-10-CM | POA: Diagnosis not present

## 2015-12-27 DIAGNOSIS — E119 Type 2 diabetes mellitus without complications: Secondary | ICD-10-CM | POA: Diagnosis not present

## 2015-12-27 DIAGNOSIS — I10 Essential (primary) hypertension: Secondary | ICD-10-CM | POA: Diagnosis present

## 2015-12-27 DIAGNOSIS — M1990 Unspecified osteoarthritis, unspecified site: Secondary | ICD-10-CM | POA: Diagnosis present

## 2015-12-27 DIAGNOSIS — G4730 Sleep apnea, unspecified: Secondary | ICD-10-CM | POA: Diagnosis present

## 2015-12-27 DIAGNOSIS — D509 Iron deficiency anemia, unspecified: Secondary | ICD-10-CM | POA: Diagnosis not present

## 2015-12-27 DIAGNOSIS — I5033 Acute on chronic diastolic (congestive) heart failure: Secondary | ICD-10-CM | POA: Diagnosis present

## 2015-12-27 DIAGNOSIS — Z7951 Long term (current) use of inhaled steroids: Secondary | ICD-10-CM

## 2015-12-27 DIAGNOSIS — Z9119 Patient's noncompliance with other medical treatment and regimen: Secondary | ICD-10-CM | POA: Diagnosis not present

## 2015-12-27 DIAGNOSIS — E876 Hypokalemia: Secondary | ICD-10-CM | POA: Diagnosis not present

## 2015-12-27 DIAGNOSIS — E7800 Pure hypercholesterolemia, unspecified: Secondary | ICD-10-CM | POA: Diagnosis present

## 2015-12-27 DIAGNOSIS — R0602 Shortness of breath: Secondary | ICD-10-CM | POA: Diagnosis present

## 2015-12-27 DIAGNOSIS — F17210 Nicotine dependence, cigarettes, uncomplicated: Secondary | ICD-10-CM | POA: Diagnosis present

## 2015-12-27 DIAGNOSIS — F419 Anxiety disorder, unspecified: Secondary | ICD-10-CM | POA: Diagnosis not present

## 2015-12-27 DIAGNOSIS — J449 Chronic obstructive pulmonary disease, unspecified: Secondary | ICD-10-CM | POA: Diagnosis present

## 2015-12-27 DIAGNOSIS — Z888 Allergy status to other drugs, medicaments and biological substances status: Secondary | ICD-10-CM

## 2015-12-27 DIAGNOSIS — Z881 Allergy status to other antibiotic agents status: Secondary | ICD-10-CM

## 2015-12-27 DIAGNOSIS — Z91013 Allergy to seafood: Secondary | ICD-10-CM

## 2015-12-27 DIAGNOSIS — I110 Hypertensive heart disease with heart failure: Principal | ICD-10-CM | POA: Diagnosis present

## 2015-12-27 DIAGNOSIS — K219 Gastro-esophageal reflux disease without esophagitis: Secondary | ICD-10-CM | POA: Diagnosis present

## 2015-12-27 DIAGNOSIS — K7460 Unspecified cirrhosis of liver: Secondary | ICD-10-CM | POA: Diagnosis present

## 2015-12-27 DIAGNOSIS — K7031 Alcoholic cirrhosis of liver with ascites: Secondary | ICD-10-CM | POA: Diagnosis present

## 2015-12-27 DIAGNOSIS — Z91040 Latex allergy status: Secondary | ICD-10-CM

## 2015-12-27 DIAGNOSIS — Z885 Allergy status to narcotic agent status: Secondary | ICD-10-CM

## 2015-12-27 DIAGNOSIS — I509 Heart failure, unspecified: Secondary | ICD-10-CM

## 2015-12-27 DIAGNOSIS — J441 Chronic obstructive pulmonary disease with (acute) exacerbation: Secondary | ICD-10-CM | POA: Diagnosis present

## 2015-12-27 LAB — CBC WITH DIFFERENTIAL/PLATELET
Basophils Absolute: 0 10*3/uL (ref 0–0.1)
Basophils Relative: 1 %
EOS PCT: 1 %
Eosinophils Absolute: 0 10*3/uL (ref 0–0.7)
HEMATOCRIT: 38.2 % (ref 35.0–47.0)
HEMOGLOBIN: 13.2 g/dL (ref 12.0–16.0)
LYMPHS ABS: 0.6 10*3/uL — AB (ref 1.0–3.6)
LYMPHS PCT: 14 %
MCH: 31.3 pg (ref 26.0–34.0)
MCHC: 34.6 g/dL (ref 32.0–36.0)
MCV: 90.4 fL (ref 80.0–100.0)
Monocytes Absolute: 0.2 10*3/uL (ref 0.2–0.9)
Monocytes Relative: 5 %
NEUTROS ABS: 3.3 10*3/uL (ref 1.4–6.5)
Neutrophils Relative %: 79 %
PLATELETS: 98 10*3/uL — AB (ref 150–440)
RBC: 4.22 MIL/uL (ref 3.80–5.20)
RDW: 14.8 % — ABNORMAL HIGH (ref 11.5–14.5)
WBC: 4.1 10*3/uL (ref 3.6–11.0)

## 2015-12-27 LAB — GLUCOSE, CAPILLARY
GLUCOSE-CAPILLARY: 64 mg/dL — AB (ref 65–99)
Glucose-Capillary: 65 mg/dL (ref 65–99)
Glucose-Capillary: 84 mg/dL (ref 65–99)

## 2015-12-27 LAB — BRAIN NATRIURETIC PEPTIDE: B Natriuretic Peptide: 240 pg/mL — ABNORMAL HIGH (ref 0.0–100.0)

## 2015-12-27 LAB — COMPREHENSIVE METABOLIC PANEL
ALK PHOS: 234 U/L — AB (ref 38–126)
ALT: 38 U/L (ref 14–54)
AST: 60 U/L — AB (ref 15–41)
Albumin: 2.5 g/dL — ABNORMAL LOW (ref 3.5–5.0)
Anion gap: 6 (ref 5–15)
BILIRUBIN TOTAL: 0.5 mg/dL (ref 0.3–1.2)
BUN: 9 mg/dL (ref 6–20)
CALCIUM: 7.9 mg/dL — AB (ref 8.9–10.3)
CO2: 31 mmol/L (ref 22–32)
CREATININE: 0.85 mg/dL (ref 0.44–1.00)
Chloride: 101 mmol/L (ref 101–111)
Glucose, Bld: 179 mg/dL — ABNORMAL HIGH (ref 65–99)
Potassium: 2.5 mmol/L — CL (ref 3.5–5.1)
Sodium: 138 mmol/L (ref 135–145)
Total Protein: 5.6 g/dL — ABNORMAL LOW (ref 6.5–8.1)

## 2015-12-27 LAB — MAGNESIUM: Magnesium: 1.5 mg/dL — ABNORMAL LOW (ref 1.7–2.4)

## 2015-12-27 LAB — TROPONIN I

## 2015-12-27 MED ORDER — SERTRALINE HCL 100 MG PO TABS
100.0000 mg | ORAL_TABLET | Freq: Two times a day (BID) | ORAL | Status: DC
  Administered 2015-12-28 – 2015-12-30 (×6): 100 mg via ORAL
  Filled 2015-12-27 (×6): qty 1

## 2015-12-27 MED ORDER — INSULIN ASPART 100 UNIT/ML ~~LOC~~ SOLN
0.0000 [IU] | Freq: Every day | SUBCUTANEOUS | Status: DC
  Administered 2015-12-29: 3 [IU] via SUBCUTANEOUS
  Administered 2015-12-29: 2 [IU] via SUBCUTANEOUS
  Filled 2015-12-27: qty 2
  Filled 2015-12-27: qty 3

## 2015-12-27 MED ORDER — SPIRONOLACTONE 25 MG PO TABS
12.5000 mg | ORAL_TABLET | Freq: Every day | ORAL | Status: DC
  Administered 2015-12-28 – 2015-12-30 (×3): 12.5 mg via ORAL
  Filled 2015-12-27 (×3): qty 1

## 2015-12-27 MED ORDER — PANTOPRAZOLE SODIUM 40 MG PO TBEC
40.0000 mg | DELAYED_RELEASE_TABLET | Freq: Every day | ORAL | Status: DC
  Administered 2015-12-28 – 2015-12-30 (×3): 40 mg via ORAL
  Filled 2015-12-27 (×3): qty 1

## 2015-12-27 MED ORDER — LAMOTRIGINE 25 MG PO TABS
150.0000 mg | ORAL_TABLET | Freq: Every day | ORAL | Status: DC
  Administered 2015-12-28 – 2015-12-29 (×3): 150 mg via ORAL
  Filled 2015-12-27 (×3): qty 1

## 2015-12-27 MED ORDER — ONDANSETRON HCL 4 MG/2ML IJ SOLN: 4.0000 mg | Freq: Four times a day (QID) | INTRAMUSCULAR | Status: DC | PRN

## 2015-12-27 MED ORDER — ARIPIPRAZOLE 5 MG PO TABS
5.0000 mg | ORAL_TABLET | Freq: Every day | ORAL | Status: DC
  Administered 2015-12-28 – 2015-12-29 (×3): 5 mg via ORAL
  Filled 2015-12-27 (×3): qty 1

## 2015-12-27 MED ORDER — ONDANSETRON HCL 4 MG PO TABS: 4.0000 mg | ORAL_TABLET | Freq: Four times a day (QID) | ORAL | Status: DC | PRN

## 2015-12-27 MED ORDER — POTASSIUM CHLORIDE CRYS ER 20 MEQ PO TBCR
EXTENDED_RELEASE_TABLET | ORAL | Status: AC
  Filled 2015-12-27: qty 2

## 2015-12-27 MED ORDER — POTASSIUM CHLORIDE CRYS ER 20 MEQ PO TBCR: 40.0000 meq | EXTENDED_RELEASE_TABLET | Freq: Once | ORAL | Status: DC

## 2015-12-27 MED ORDER — LEVOTHYROXINE SODIUM 88 MCG PO TABS
88.0000 ug | ORAL_TABLET | Freq: Every day | ORAL | Status: DC
  Administered 2015-12-28 – 2015-12-30 (×3): 88 ug via ORAL
  Filled 2015-12-27 (×3): qty 1

## 2015-12-27 MED ORDER — METOPROLOL TARTRATE 25 MG PO TABS
25.0000 mg | ORAL_TABLET | Freq: Once | ORAL | Status: AC
  Administered 2015-12-27: 25 mg via ORAL

## 2015-12-27 MED ORDER — METOCLOPRAMIDE HCL 10 MG PO TABS: 10.0000 mg | ORAL_TABLET | Freq: Four times a day (QID) | ORAL | Status: DC | PRN

## 2015-12-27 MED ORDER — METOPROLOL TARTRATE 50 MG PO TABS
50.0000 mg | ORAL_TABLET | Freq: Two times a day (BID) | ORAL | Status: DC
  Administered 2015-12-28 – 2015-12-30 (×5): 50 mg via ORAL
  Filled 2015-12-27 (×5): qty 1

## 2015-12-27 MED ORDER — SODIUM CHLORIDE 0.9% FLUSH
3.0000 mL | Freq: Two times a day (BID) | INTRAVENOUS | Status: DC
  Administered 2015-12-28 – 2015-12-29 (×5): 3 mL via INTRAVENOUS

## 2015-12-27 MED ORDER — FUROSEMIDE 10 MG/ML IJ SOLN
60.0000 mg | Freq: Once | INTRAMUSCULAR | Status: AC
  Administered 2015-12-27: 60 mg via INTRAVENOUS
  Filled 2015-12-27: qty 8

## 2015-12-27 MED ORDER — INSULIN ASPART 100 UNIT/ML ~~LOC~~ SOLN
0.0000 [IU] | Freq: Three times a day (TID) | SUBCUTANEOUS | Status: DC
  Administered 2015-12-28 – 2015-12-29 (×4): 2 [IU] via SUBCUTANEOUS
  Administered 2015-12-30: 1 [IU] via SUBCUTANEOUS
  Administered 2015-12-30: 3 [IU] via SUBCUTANEOUS
  Filled 2015-12-27: qty 2
  Filled 2015-12-27: qty 1
  Filled 2015-12-27 (×2): qty 2
  Filled 2015-12-27: qty 3
  Filled 2015-12-27: qty 2

## 2015-12-27 MED ORDER — POTASSIUM CHLORIDE CRYS ER 20 MEQ PO TBCR
20.0000 meq | EXTENDED_RELEASE_TABLET | Freq: Once | ORAL | Status: AC
Start: 2015-12-27 — End: 2015-12-28
  Administered 2015-12-28: 20 meq via ORAL
  Filled 2015-12-27: qty 1

## 2015-12-27 MED ORDER — LACTULOSE 10 GM/15ML PO SOLN
20.0000 g | Freq: Two times a day (BID) | ORAL | Status: DC
  Administered 2015-12-28 – 2015-12-30 (×6): 20 g via ORAL
  Filled 2015-12-27 (×6): qty 30

## 2015-12-27 MED ORDER — MOMETASONE FURO-FORMOTEROL FUM 100-5 MCG/ACT IN AERO
2.0000 | INHALATION_SPRAY | Freq: Two times a day (BID) | RESPIRATORY_TRACT | Status: DC
  Administered 2015-12-28 – 2015-12-30 (×6): 2 via RESPIRATORY_TRACT
  Filled 2015-12-27: qty 8.8

## 2015-12-27 MED ORDER — METOPROLOL TARTRATE 25 MG PO TABS
ORAL_TABLET | ORAL | Status: AC
  Administered 2015-12-27: 25 mg via ORAL
  Filled 2015-12-27: qty 1

## 2015-12-27 MED ORDER — ALLOPURINOL 100 MG PO TABS
100.0000 mg | ORAL_TABLET | Freq: Every day | ORAL | Status: DC
  Administered 2015-12-28 – 2015-12-30 (×3): 100 mg via ORAL
  Filled 2015-12-27 (×3): qty 1

## 2015-12-27 MED ORDER — POTASSIUM CHLORIDE CRYS ER 20 MEQ PO TBCR
40.0000 meq | EXTENDED_RELEASE_TABLET | Freq: Once | ORAL | Status: AC
  Administered 2015-12-27: 40 meq via ORAL

## 2015-12-27 MED ORDER — HYDROMORPHONE HCL 2 MG PO TABS
2.0000 mg | ORAL_TABLET | Freq: Two times a day (BID) | ORAL | Status: DC | PRN
  Administered 2015-12-28 – 2015-12-29 (×2): 2 mg via ORAL
  Filled 2015-12-27 (×2): qty 1

## 2015-12-27 MED ORDER — ATORVASTATIN CALCIUM 10 MG PO TABS
10.0000 mg | ORAL_TABLET | Freq: Every day | ORAL | Status: DC
  Administered 2015-12-28 – 2015-12-29 (×3): 10 mg via ORAL
  Filled 2015-12-27 (×3): qty 1

## 2015-12-27 MED ORDER — HYDRALAZINE HCL 20 MG/ML IJ SOLN: 10.0000 mg | INTRAMUSCULAR | Status: DC | PRN

## 2015-12-27 MED ORDER — POTASSIUM CHLORIDE IN NACL 20-0.9 MEQ/L-% IV SOLN
Freq: Once | INTRAVENOUS | Status: AC
  Administered 2015-12-27: 20:00:00 via INTRAVENOUS
  Filled 2015-12-27: qty 1000

## 2015-12-27 NOTE — ED Notes (Signed)
Pt diaphoretic. Pt reports she believes her blood sugar may be low. MD informed. CBG performed.

## 2015-12-27 NOTE — H&P (Signed)
[facility] NAME: Samantha Mcmillan    MR#:  [id number]  DATE OF BIRTH:  19-Mar-1955  DATE OF ADMISSION:  12/27/2015  PRIMARY CARE PHYSICIAN: Casilda Carls   REQUESTING/REFERRING PHYSICIAN: Kerman Passey, MD  CHIEF COMPLAINT:   Chief Complaint  Patient presents with  . Shortness of Breath    HISTORY OF PRESENT ILLNESS:  Samantha Mcmillan  is a 60 y.o. female who presents with Rest of shortness of breath. Patient states that she has been experiencing increasing abdominal swelling as well as lower extremity swelling. She has a history of cirrhosis and CHF. On evaluation here in the ED she was also noted to be hypokalemic. Hospitals were called for admission for diuresis and repletion of potassium.  PAST MEDICAL HISTORY:   Past Medical History:  Diagnosis Date  . Acute on chronic diastolic CHF (congestive heart failure) ([facility]) 05/16/2014  . Anemia   . Anxiety   . Arthritis    "some in my feet" (05/04/2014)  . Asthma   . Chronic bronchitis ([place])    "get it pretty much q yr"   . Chronic lower back pain   . Chronic respiratory failure ([place])   . Cirrhosis of liver ([place])   . Cirrhosis of liver ([place])   . Colon polyp   . COPD (chronic obstructive pulmonary disease) ([place])   . Depression   . Family history of adverse reaction to anesthesia    "my sister doesn't wake up good when she's put deep to sleep"  . GERD (gastroesophageal reflux disease)   . Headache    "more than a couple times/wk" (05/04/2014)  . Heart murmur   . High cholesterol   . History of blood transfusion    "related to my anemia"  . Hypertension   . IBS (irritable bowel syndrome)   . IDA (iron deficiency anemia) 08/14/2014  . Iron deficiency anemia   . OCD (obsessive compulsive disorder)   . On home oxygen therapy    "2L; 24/7" (05/04/2014)  . Panic attack   . Personal history of tobacco use, presenting hazards to health 06/21/2015  . Pneumonia "several  times"  . PTSD (post-traumatic stress disorder)   . Sleep apnea    "can't tolerate CPAP" (05/04/2014)  . Type II diabetes mellitus ([place])     PAST SURGICAL HISTORY:   Past Surgical History:  Procedure Laterality Date  . BACK SURGERY    . CATARACT EXTRACTION W/ INTRAOCULAR LENS  IMPLANT, BILATERAL Bilateral 2005  . CESAREAN SECTION  1979  . COLONOSCOPY  2014  . DILATION AND CURETTAGE OF UTERUS    . INCISION AND DRAINAGE OF WOUND Right 2009   "leg mauled  by dog"  . PERIPHERAL VASCULAR CATHETERIZATION N/A 08/16/2014   Procedure: PICC Line Insertion;  Surgeon: Algernon Huxley, MD;  Location: [facility];  Service: Cardiovascular;  Laterality: N/A;  . POSTERIOR FUSION CERVICAL SPINE  2015   "rebuilt 3 of my neck vertebrae"  . TUBAL LIGATION  1979  . UPPER GASTROINTESTINAL ENDOSCOPY      SOCIAL HISTORY:   Social History  Substance Use Topics  . Smoking status: Current Every Day Smoker    Packs/day: 0.50    Years: 48.00    Types: Cigarettes  . Smokeless tobacco: Never Used  . Alcohol use No    FAMILY HISTORY:   Family History  Problem Relation Age of Onset  . Cancer Father     pancreatic  .  Cancer Brother   . Breast cancer Maternal Aunt     DRUG ALLERGIES:   Allergies  Allergen Reactions  . Fish Allergy Anaphylaxis and Swelling  . Iodine Shortness Of Breath  . Latex Anaphylaxis  . Other Rash, Other (See Comments), Shortness Of Breath, Anaphylaxis and Swelling    Pt states that she is allergic to Darvocet.   . Oxycodone-Acetaminophen Other (See Comments), Rash, Swelling and Shortness Of Breath    wheezing wheezing  . Percocet [Oxycodone-Acetaminophen] Shortness Of Breath  . Shellfish Allergy Anaphylaxis and Swelling  . Shellfish-Derived Products Anaphylaxis, Rash, Shortness Of Breath and Swelling    Throat closes.  . Amitriptyline Other (See Comments)    Other reaction(s): Other (See Comments) Altered mental status, agitation Reaction:  Mental changes    . Fish-Derived Products Other (See Comments) and Swelling    "respiratory distress and wheezing"  . Tape Other (See Comments)    Pt states that it pulls her skin off.  Pt states that it pulls her skin off.   . Wellbutrin [Bupropion] Other (See Comments)    Reaction:  Mental changes   . Augmentin [Amoxicillin-Pot Clavulanate] Rash and Other (See Comments)    Has patient had a PCN reaction causing immediate rash, facial/tongue/throat swelling, SOB or lightheadedness with hypotension: No Has patient had a PCN reaction causing severe rash involving mucus membranes or skin necrosis: No Has patient had a PCN reaction that required hospitalization No Has patient had a PCN reaction occurring within the last 10 years: No If all of the above answers are "NO", then may proceed with Cephalosporin use.  . Codeine Rash  . Cortisone Rash  . Diphenhydramine Rash  . Vicodin [Hydrocodone-Acetaminophen] Rash and Other (See Comments)    Reaction:  Dizziness      MEDICATIONS AT HOME:   Prior to Admission medications   Medication Sig Start Date End Date Taking? Authorizing Provider  albuterol (PROVENTIL HFA;VENTOLIN HFA) 108 (90 BASE) MCG/ACT inhaler Inhale 2 puffs into the lungs every 4 (four) hours as needed for wheezing or shortness of breath.   Yes Historical Provider, MD  albuterol (PROVENTIL) (2.5 MG/3ML) 0.083% nebulizer solution Take 2.5 mg by nebulization every 6 (six) hours as needed for wheezing or shortness of breath.   Yes Historical Provider, MD  allopurinol (ZYLOPRIM) 100 MG tablet Take 100 mg by mouth daily.    Yes Historical Provider, MD  ARIPiprazole (ABILIFY) 5 MG tablet Take 5 mg by mouth at bedtime.    Yes Historical Provider, MD  atorvastatin (LIPITOR) 10 MG tablet Take 10 mg by mouth at bedtime.    Yes Historical Provider, MD  budesonide-formoterol (SYMBICORT) 80-4.5 MCG/ACT inhaler Inhale 2 puffs into the lungs 2 (two) times daily.    Yes Historical Provider, MD  bumetanide (BUMEX)  1 MG tablet Take 1 mg by mouth 2 (two) times daily. 07/04/15  Yes Historical Provider, MD  ferrous fumarate-iron polysaccharide complex (TANDEM) 162-115.2 MG CAPS capsule Take 1 capsule by mouth 2 (two) times daily after a meal. 08/07/15  Yes Cammie Sickle, MD  fluticasone (FLONASE) 50 MCG/ACT nasal spray Place 1-2 sprays into both nostrils daily as needed for rhinitis.   Yes Historical Provider, MD  HYDROmorphone (DILAUDID) 2 MG tablet Take 1 tablet (2 mg total) by mouth every 6 (six) hours as needed for severe pain. Patient taking differently: Take 2 mg by mouth every 12 (twelve) hours as needed for severe pain.  07/09/15  Yes Bettey Costa, MD  lactulose ([place]) 10  GM/15ML solution Take 20 g by mouth 2 (two) times daily.    Yes Historical Provider, MD  lamoTRIgine (LAMICTAL) 150 MG tablet Take 150 mg by mouth at bedtime.    Yes Historical Provider, MD  levothyroxine (SYNTHROID, LEVOTHROID) 88 MCG tablet Take 88 mcg by mouth daily before breakfast.    Yes Historical Provider, MD  metoCLOPramide (REGLAN) 10 MG tablet Take 10 mg by mouth 4 (four) times daily as needed for nausea or vomiting.    Yes Historical Provider, MD  metoprolol (LOPRESSOR) 50 MG tablet Take 50 mg by mouth 2 (two) times daily.   Yes Historical Provider, MD  Multiple Vitamin (MULTIVITAMIN WITH MINERALS) TABS tablet Take 1 tablet by mouth daily. 05/18/14  Yes Christina P Rama, MD  pantoprazole (PROTONIX) 40 MG tablet Take 40 mg by mouth daily.   Yes Historical Provider, MD  senna-docusate (SENOKOT-S) 8.6-50 MG tablet Take 1 tablet by mouth 2 (two) times daily.   Yes Historical Provider, MD  sertraline (ZOLOFT) 100 MG tablet Take 100 mg by mouth 2 (two) times daily.    Yes Historical Provider, MD  Buprenorphine (BUTRANS) 15 MCG/HR PTWK Place 20 mcg onto the skin once a week. Pt applies on Sunday. Patient not taking: Reported on 12/27/2015 07/09/15   Bettey Costa, MD    REVIEW OF SYSTEMS:  Review of Systems  Constitutional:  Negative for chills, fever, malaise/fatigue and weight loss.  HENT: Negative for ear pain, hearing loss and tinnitus.   Eyes: Negative for blurred vision, double vision, pain and redness.  Respiratory: Positive for shortness of breath. Negative for cough and hemoptysis.   Cardiovascular: Positive for leg swelling. Negative for chest pain, palpitations and orthopnea.  Gastrointestinal: Positive for abdominal pain. Negative for constipation, diarrhea, nausea and vomiting.  Genitourinary: Negative for dysuria, frequency and hematuria.  Musculoskeletal: Negative for back pain, joint pain and neck pain.  Skin:       No acne, rash, or lesions  Neurological: Negative for dizziness, tremors, focal weakness and weakness.  Endo/Heme/Allergies: Negative for polydipsia. Does not bruise/bleed easily.  Psychiatric/Behavioral: Negative for depression. The patient is not nervous/anxious and does not have insomnia.      VITAL SIGNS:   Vitals:   12/27/15 1621 12/27/15 1630 12/27/15 1700 12/27/15 2048  BP: (!) 181/76 (!) 183/74 (!) 173/72 (!) 223/75  Pulse: 68 66 66 73  Resp: 17 15 16    SpO2: 99% 99% 97% 92%   Wt Readings from Last 3 Encounters:  12/26/15 88 kg (194 lb)  11/12/15 80.3 kg (177 lb)  10/31/15 82.6 kg (182 lb)    PHYSICAL EXAMINATION:  Physical Exam  Vitals reviewed. Constitutional: She is oriented to person, place, and time. She appears well-developed and well-nourished. No distress.  HENT:  Head: Normocephalic and atraumatic.  Mouth/Throat: Oropharynx is clear and moist.  Eyes: Conjunctivae and EOM are normal. Pupils are equal, round, and reactive to light. No scleral icterus.  Neck: Normal range of motion. Neck supple. No JVD present. No thyromegaly present.  Cardiovascular: Normal rate, regular rhythm and intact distal pulses.  Exam reveals no gallop and no friction rub.   Murmur (2/6 systolic murmur) heard. Respiratory: Effort normal and breath sounds normal. No respiratory  distress. She has no wheezes. She has no rales.  GI: Soft. Bowel sounds are normal. She exhibits distension. There is tenderness.  Musculoskeletal: Normal range of motion. She exhibits edema.  No arthritis, no gout  Lymphadenopathy:    She has no cervical adenopathy.  Neurological: She is alert and oriented to person, place, and time. No cranial nerve deficit.  No dysarthria, no aphasia  Skin: Skin is warm and dry. No rash noted. No erythema.  Psychiatric: She has a normal mood and affect. Her behavior is normal. Judgment and thought content normal.    LABORATORY PANEL:   CBC  Recent Labs Lab 12/27/15 1415  WBC 4.1  HGB 13.2  HCT 38.2  PLT 98*   ------------------------------------------------------------------------------------------------------------------  Chemistries   Recent Labs Lab 12/27/15 1415  NA 138  K 2.5*  CL 101  CO2 31  GLUCOSE 179*  BUN 9  CREATININE 0.85  CALCIUM 7.9*  MG 1.5*  AST 60*  ALT 38  ALKPHOS 234*  BILITOT 0.5   ------------------------------------------------------------------------------------------------------------------  Cardiac Enzymes  Recent Labs Lab 12/27/15 1415  TROPONINI <0.03   ------------------------------------------------------------------------------------------------------------------  RADIOLOGY:  Dg Chest 2 View  Result Date: 12/27/2015 CLINICAL DATA:  CHF. EXAM: CHEST  2 VIEW COMPARISON:  10/31/2015. FINDINGS: Cardiomegaly with mild bilateral from interstitial prominence consistent with congestive heart failure. No pleural effusion or pneumothorax. Cervical spine fusion. Stable lower thoracic vertebral body mild compression fracture . IMPRESSION: Congestive heart failure with mild pulmonary interstitial edema. Electronically Signed   By: Marcello Moores  Register   On: 12/27/2015 17:00    EKG:   Orders placed or performed during the hospital encounter of 12/27/15  . ED EKG  . ED EKG  . EKG 12-Lead  . EKG  12-Lead    IMPRESSION AND PLAN:  Principal Problem:   Acute on chronic diastolic CHF (congestive heart failure) ([facility]) - clinical symptoms of acute CHF. Will use IV diuretics, and get a cardiology consult Active Problems:   Hepatic cirrhosis ([place]) - likely contributing to her ascites and abdominal pain and distention, IV diuretics as above, as well as initiation of spironolactone   Hypokalemia - replete, monitor, and spironolactone is potassium sparing diuretic as above   DM2 (diabetes mellitus, type 2) ([facility]) - sliding scale insulin with corresponding glucose checks   Advanced COPD ([place]) - continue home inhalers   Essential hypertension - continue home meds   Hypothyroidism - home dose thyroid replacement  All the records are reviewed and case discussed with ED provider. Management plans discussed with the patient and/or family.  DVT PROPHYLAXIS: Mechanical only  GI PROPHYLAXIS: None  ADMISSION STATUS: Inpatient  CODE STATUS: Full Code Status History    Date Active Date Inactive Code Status Order ID Comments User Context   07/06/2015  7:57 PM 07/09/2015  5:14 PM Full Code [id number]  Idelle Crouch, MD Inpatient   04/16/2015  6:01 AM 04/17/2015  5:27 PM Full Code [id number]  Saundra Shelling, MD Inpatient   09/06/2014  4:01 PM 09/08/2014  2:39 PM Full Code [id number]  Demetrios Loll, MD Inpatient   08/26/2014 10:01 PM 08/29/2014  3:33 PM Full Code [id number]  Lytle Butte, MD ED   07/07/2014  1:38 AM 07/09/2014  3:57 PM Full Code [id number]  Juluis Mire, MD Inpatient   05/15/2014 10:45 PM 05/18/2014  6:07 PM Full Code [id number]  Allyne Gee, MD Inpatient   05/04/2014  4:03 PM 05/09/2014  4:55 PM Full Code [id number]  Rigoberto Noel, MD ED      TOTAL TIME TAKING CARE OF THIS PATIENT: 45 minutes.    Marvie Brevik [place] 12/27/2015, 8:50 PM  [facility]  Office  [phone]  CC: Primary care physician; Casilda Carls

## 2015-12-27 NOTE — ED Notes (Signed)
Attempted to call report to floor. Informed RN unavailable and would call back

## 2015-12-27 NOTE — ED Notes (Signed)
Pt reports Hx of CHF and cirrhosis. Pt c/o SOB and abdominal distension. Pt reports she's gained approx 28lbs in the last 2-3 weeks.

## 2015-12-27 NOTE — ED Notes (Signed)
MD Schaevitz informed of patient's BP

## 2015-12-27 NOTE — ED Notes (Signed)
Pt speech slightly slurred. CBG performed - 65. MD Jannifer Franklin ordered juice. Orange juice given. RN will continue to monitor

## 2015-12-27 NOTE — ED Notes (Signed)
Patient transported to X-ray 

## 2015-12-27 NOTE — ED Triage Notes (Signed)
Reports sob and swelling in extremities.  No resp distress at this time.

## 2015-12-27 NOTE — ED Notes (Signed)
MD informed of pt's POCT CBG. Pt brought 8 ounces orange juice. RN will continue to monitor

## 2015-12-27 NOTE — ED Provider Notes (Signed)
[facility] Emergency Department Provider Note  Time seen: 4:36 PM  I have reviewed the triage vital signs and the nursing notes.   HISTORY  Chief Complaint Shortness of Breath    HPI Samantha Mcmillan is a 60 y.o. female with multiple medical issues including liver cirrhosis who presents the emergency department with difficulty breathing. According to the patient over the past one month she has gained approximately 20-30 pounds of fluid.Patient states over the past 1-2 weeks she has Increased trouble breathing.Patient denies any sudden acute increased today states she can't do the emergency department evaluated. Has never required a paracentesis in the past. Denies any fever, nausea or vomiting. Patient does state increased lower extremity edema.  Past Medical History:  Diagnosis Date  . Acute on chronic diastolic CHF (congestive heart failure) ([place]) 05/16/2014  . Anemia   . Anxiety   . Arthritis    "some in my feet" (05/04/2014)  . Asthma   . Chronic bronchitis ([place])    "get it pretty much q yr"   . Chronic lower back pain   . Chronic respiratory failure ([place])   . Cirrhosis of liver ([place])   . Cirrhosis of liver ([place])   . Colon polyp   . COPD (chronic obstructive pulmonary disease) ([place])   . Depression   . Family history of adverse reaction to anesthesia    "my sister doesn't wake up good when she's put deep to sleep"  . GERD (gastroesophageal reflux disease)   . Headache    "more than a couple times/wk" (05/04/2014)  . Heart murmur   . High cholesterol   . History of blood transfusion    "related to my anemia"  . Hypertension   . IBS (irritable bowel syndrome)   . IDA (iron deficiency anemia) 08/14/2014  . Iron deficiency anemia   . OCD (obsessive compulsive disorder)   . On home oxygen therapy    "2L; 24/7" (05/04/2014)  . Panic attack   . Personal history of tobacco use, presenting hazards to health 06/21/2015  . Pneumonia "several times"  .  PTSD (post-traumatic stress disorder)   . Sleep apnea    "can't tolerate CPAP" (05/04/2014)  . Type II diabetes mellitus [facility])     Patient Active Problem List   Diagnosis Date Noted  . Abdominal pain 11/13/2015  . Chronic pain 11/13/2015  . Closed nondisplaced fracture of seventh cervical vertebra with routine healing 11/13/2015  . Essential hypertension 11/13/2015  . Gallstone pancreatitis 11/13/2015  . Jaundice 11/13/2015  . Closed nondisplaced fracture of seventh cervical vertebra ([place]) 11/01/2015  . ARF (acute renal failure) ([place]) 07/06/2015  . Hyperkalemia 07/06/2015  . Dehydration 07/06/2015  . Personal history of tobacco use, presenting hazards to health 06/21/2015  . Bilateral carotid artery stenosis 06/04/2015  . Mixed hyperlipidemia 06/04/2015  . Fall 04/16/2015  . Bacterial skin infection of leg 10/24/2014  . Edema leg 10/08/2014  . Hypertensive pulmonary vascular disease 10/08/2014  . Chronic diastolic heart failure ([place]) 09/21/2014  . Tobacco use 09/21/2014  . Thrombocytopenia ([place]) 09/19/2014  . Leukopenia 09/19/2014  . Stasis dermatitis of left lower extremity with venous ulcer due to chronic peripheral venous hypertension ([place]) 08/15/2014  . IDA (iron deficiency anemia) 08/14/2014  . Ascites 07/25/2014  . Cellulitis and abscess of lower extremity 07/07/2014  . Encephalopathy, hepatic ([place]) 05/18/2014  . Polypharmacy 05/18/2014  . Failure to thrive in adult 05/16/2014  . Hepatic cirrhosis ([place]) 05/16/2014  . Protein-calorie malnutrition, severe ([place])  05/16/2014  . Advanced COPD ([place]) 05/16/2014  . Chronic back pain 05/16/2014  . Weakness 05/15/2014  . Hyponatremia 05/15/2014  . HCAP (healthcare-associated pneumonia) 05/09/2014  . Urinary retention 05/09/2014  . DM2 (diabetes mellitus, type 2) ([place]) 05/09/2014  . Hypothyroidism 05/05/2014    Past Surgical History:  Procedure Laterality Date  . BACK SURGERY    . CATARACT EXTRACTION W/ INTRAOCULAR LENS   IMPLANT, BILATERAL Bilateral 2005  . CESAREAN SECTION  1979  . COLONOSCOPY  2014  . DILATION AND CURETTAGE OF UTERUS    . INCISION AND DRAINAGE OF WOUND Right 2009   "leg mauled  by dog"  . PERIPHERAL VASCULAR CATHETERIZATION N/A 08/16/2014   Procedure: PICC Line Insertion;  Surgeon: Algernon Huxley, MD;  Location: [facility];  Service: Cardiovascular;  Laterality: N/A;  . POSTERIOR FUSION CERVICAL SPINE  2015   "rebuilt 3 of my neck vertebrae"  . TUBAL LIGATION  1979  . UPPER GASTROINTESTINAL ENDOSCOPY      Prior to Admission medications   Medication Sig Start Date End Date Taking? Authorizing Provider  albuterol (PROVENTIL HFA;VENTOLIN HFA) 108 (90 BASE) MCG/ACT inhaler Inhale 2 puffs into the lungs every 4 (four) hours as needed for wheezing or shortness of breath.    Historical Provider, MD  albuterol (PROVENTIL) (2.5 MG/3ML) 0.083% nebulizer solution Take 2.5 mg by nebulization every 6 (six) hours as needed for wheezing or shortness of breath.    Historical Provider, MD  allopurinol (ZYLOPRIM) 100 MG tablet Take 100 mg by mouth 2 (two) times daily.     Historical Provider, MD  ARIPiprazole (ABILIFY) 5 MG tablet Take 5 mg by mouth at bedtime.     Historical Provider, MD  atorvastatin (LIPITOR) 10 MG tablet Take 10 mg by mouth at bedtime.     Historical Provider, MD  budesonide-formoterol (SYMBICORT) 80-4.5 MCG/ACT inhaler Inhale 2 puffs into the lungs 2 (two) times daily.     Historical Provider, MD  bumetanide (BUMEX) 1 MG tablet Take 1 mg by mouth 2 (two) times daily. 07/04/15   Historical Provider, MD  Buprenorphine (BUTRANS) 15 MCG/HR PTWK Place 20 mcg onto the skin once a week. Pt applies on Sunday. 07/09/15   Bettey Costa, MD  ferrous fumarate-iron polysaccharide complex (TANDEM) 162-115.2 MG CAPS capsule Take 1 capsule by mouth 2 (two) times daily after a meal. 08/07/15   Cammie Sickle, MD  fluticasone (FLONASE) 50 MCG/ACT nasal spray Place 1-2 sprays into both nostrils  daily as needed for rhinitis.    Historical Provider, MD  gabapentin (NEURONTIN) 300 MG capsule Take 300 mg by mouth at bedtime.     Historical Provider, MD  HYDROmorphone (DILAUDID) 2 MG tablet Take 1 tablet (2 mg total) by mouth every 6 (six) hours as needed for severe pain. 07/09/15   Bettey Costa, MD  lactulose (CHRONULAC) 10 GM/15ML solution Take 20 g by mouth 2 (two) times daily as needed for mild constipation.    Historical Provider, MD  lamoTRIgine (LAMICTAL) 150 MG tablet Take 150 mg by mouth at bedtime.     Historical Provider, MD  levothyroxine (SYNTHROID, LEVOTHROID) 88 MCG tablet Take 88 mcg by mouth daily before breakfast.     Historical Provider, MD  metoCLOPramide (REGLAN) 10 MG tablet Take 10 mg by mouth 4 (four) times daily as needed for nausea or vomiting.     Historical Provider, MD  metoprolol tartrate (LOPRESSOR) 25 MG tablet Take 25 mg by mouth 2 (two) times daily.  Historical Provider, MD  Multiple Vitamin (MULTIVITAMIN WITH MINERALS) TABS tablet Take 1 tablet by mouth daily. 05/18/14   Christina P Rama, MD  ondansetron (ZOFRAN) 4 MG tablet Take 4 mg by mouth every 8 (eight) hours as needed for nausea or vomiting.     Historical Provider, MD  pantoprazole (PROTONIX) 40 MG tablet Take 40 mg by mouth daily.    Historical Provider, MD  senna-docusate (SENOKOT-S) 8.6-50 MG tablet Take 1 tablet by mouth 2 (two) times daily.    Historical Provider, MD  sertraline (ZOLOFT) 100 MG tablet Take 100 mg by mouth 2 (two) times daily.     Historical Provider, MD    Allergies  Allergen Reactions  . Fish Allergy Anaphylaxis and Swelling  . Iodine Shortness Of Breath  . Latex Anaphylaxis  . Other Rash, Other (See Comments), Shortness Of Breath, Anaphylaxis and Swelling    Pt states that she is allergic to Darvocet.   . Oxycodone-Acetaminophen Other (See Comments), Rash, Swelling and Shortness Of Breath    wheezing wheezing  . Percocet [Oxycodone-Acetaminophen] Shortness Of Breath  .  Shellfish Allergy Anaphylaxis and Swelling  . Shellfish-Derived Products Anaphylaxis, Rash, Shortness Of Breath and Swelling    Throat closes.  . Amitriptyline Other (See Comments)    Other reaction(s): Other (See Comments) Altered mental status, agitation Reaction:  Mental changes   . Fish-Derived Products Other (See Comments) and Swelling    "respiratory distress and wheezing"  . Tape Other (See Comments)    Pt states that it pulls her skin off.  Pt states that it pulls her skin off.   . Wellbutrin [Bupropion] Other (See Comments)    Reaction:  Mental changes   . Augmentin [Amoxicillin-Pot Clavulanate] Rash and Other (See Comments)    Has patient had a PCN reaction causing immediate rash, facial/tongue/throat swelling, SOB or lightheadedness with hypotension: No Has patient had a PCN reaction causing severe rash involving mucus membranes or skin necrosis: No Has patient had a PCN reaction that required hospitalization No Has patient had a PCN reaction occurring within the last 10 years: No If all of the above answers are "NO", then may proceed with Cephalosporin use.  . Codeine Rash  . Cortisone Rash  . Diphenhydramine Rash  . Vicodin [Hydrocodone-Acetaminophen] Rash and Other (See Comments)    Reaction:  Dizziness      Family History  Problem Relation Age of Onset  . Cancer Father     pancreatic  . Cancer Brother   . Breast cancer Maternal Aunt     Social History Social History  Substance Use Topics  . Smoking status: Current Every Day Smoker    Packs/day: 0.50    Years: 48.00    Types: Cigarettes  . Smokeless tobacco: Never Used  . Alcohol use No    Review of Systems Constitutional: Negative for fever. Cardiovascular: Negative for chest pain. Respiratory: Negative for shortness of breath. Gastrointestinal: Negative for abdominal pain Neurological: Negative for headache 10-point ROS otherwise  negative.  ____________________________________________   PHYSICAL EXAM:  Constitutional: Alert and oriented. Well appearing and in no distress. Eyes: Normal exam ENT   Head: Normocephalic and atraumatic.   Mouth/Throat: Mucous membranes are moist. Cardiovascular: Normal rate, regular rhythm. No murmur Respiratory: Normal respiratory effort without tachypnea nor retractions. Breath sounds are clear  Gastrointestinal: Mild distention but still soft, mild right upper quadrant tenderness to palpation, no rebound or guarding. Abdomen mostly benign. Musculoskeletal: Nontender with normal range of motion in all extremities. Moderate  lower extremity edema equal bilaterally, nontender. Neurologic:  Normal speech and language. No gross focal neurologic deficits Skin:  Skin is warm, dry and intact.  Psychiatric: Mood and affect are normal. Speech and behavior are normal.   ____________________________________________    EKG  EKG reviewed and interpreted by myself shows normal sinus rhythm at 64 bpm, narrow QRS, normal axis, normal intervals, no concerning ST changes. No ST elevations.   ____________________________________________    RADIOLOGY  Congestive heart failure with mild pulmonary edema  ____________________________________________   INITIAL IMPRESSION / ASSESSMENT AND PLAN / ED COURSE  Pertinent labs & imaging results that were available during my care of the patient were reviewed by me and considered in my medical decision making (see chart for details).  The patient presents to the emergency department for difficulty breathing. Patient has a history of cirrhosis states a significant weight The past several weeks. Patient's labs show a significant hypokalemia. Patient has increased lower extremity edema. She states was on spironolactone for a time but that was discontinued. I believe this would be a good option for the patient to help increase her potassium while  allowing adequate diuresis. We'll obtain a chest x-ray to rule out significant pleural effusion. At this time the patient does have a fairly large abdomen but it is not tight, remained soft, no concerns of SBP. I do not believe the patient's abdomen is to the point that requires therapeutic paracentesis. Patient's vitals are largely normal with a 99% room air sat. Patient already sees Dr. Allen Norris, saw him yesterday.  Patient's labs have resulted showing a low potassium of 2.5. Patient's chest x-ray consistent with pulmonary edema. Given the patient shortness of breath most worse with any type of exertion along with her edema on chest x-ray increased weight gain in increased lower extremity edema I believe the patient would benefit from admission to the hospital for IV diuresis with potassium repletion.   ____________________________________________   FINAL CLINICAL IMPRESSION(S) / ED DIAGNOSES  Dyspnea Pulmonary edema Congestive heart failure   Harvest Dark, MD 12/27/15 1931

## 2015-12-28 DIAGNOSIS — I110 Hypertensive heart disease with heart failure: Secondary | ICD-10-CM | POA: Diagnosis not present

## 2015-12-28 DIAGNOSIS — R0602 Shortness of breath: Secondary | ICD-10-CM | POA: Diagnosis not present

## 2015-12-28 LAB — BASIC METABOLIC PANEL
Anion gap: 4 — ABNORMAL LOW (ref 5–15)
BUN: 9 mg/dL (ref 6–20)
CALCIUM: 7.7 mg/dL — AB (ref 8.9–10.3)
CO2: 32 mmol/L (ref 22–32)
CREATININE: 0.66 mg/dL (ref 0.44–1.00)
Chloride: 104 mmol/L (ref 101–111)
GFR calc Af Amer: >60 mL/min (ref >60)
GFR calc non Af Amer: >60 mL/min (ref >60)
GLUCOSE: 100 mg/dL — AB (ref 65–99)
Potassium: 3.1 mmol/L — ABNORMAL LOW (ref 3.5–5.1)
Sodium: 140 mmol/L (ref 135–145)

## 2015-12-28 LAB — CBC
HCT: 37.8 % (ref 35.0–47.0)
Hemoglobin: 12.9 g/dL (ref 12.0–16.0)
MCH: 30.8 pg (ref 26.0–34.0)
MCHC: 34.1 g/dL (ref 32.0–36.0)
MCV: 90.2 fL (ref 80.0–100.0)
PLATELETS: 94 10*3/uL — AB (ref 150–440)
RBC: 4.19 MIL/uL (ref 3.80–5.20)
RDW: 14.8 % — ABNORMAL HIGH (ref 11.5–14.5)
WBC: 3 10*3/uL — ABNORMAL LOW (ref 3.6–11.0)

## 2015-12-28 LAB — GLUCOSE, CAPILLARY
GLUCOSE-CAPILLARY: 135 mg/dL — AB (ref 65–99)
GLUCOSE-CAPILLARY: 107 mg/dL — AB (ref 65–99)
GLUCOSE-CAPILLARY: 157 mg/dL — AB (ref 65–99)
Glucose-Capillary: 50 mg/dL — ABNORMAL LOW (ref 65–99)
Glucose-Capillary: 182 mg/dL — ABNORMAL HIGH (ref 65–99)
Glucose-Capillary: 252 mg/dL — ABNORMAL HIGH (ref 65–99)

## 2015-12-28 MED ORDER — MAGNESIUM SULFATE 4 GM/100ML IV SOLN
4.0000 g | Freq: Once | INTRAVENOUS | Status: AC
  Administered 2015-12-28: 4 g via INTRAVENOUS
  Filled 2015-12-28: qty 100

## 2015-12-28 MED ORDER — BUMETANIDE 1 MG PO TABS
1.0000 mg | ORAL_TABLET | Freq: Two times a day (BID) | ORAL | Status: DC
  Administered 2015-12-28: 1 mg via ORAL
  Filled 2015-12-28: qty 1

## 2015-12-28 MED ORDER — FUROSEMIDE 10 MG/ML IJ SOLN
40.0000 mg | Freq: Two times a day (BID) | INTRAMUSCULAR | Status: DC
  Administered 2015-12-28 – 2015-12-30 (×5): 40 mg via INTRAVENOUS
  Filled 2015-12-28 (×5): qty 4

## 2015-12-28 NOTE — Progress Notes (Signed)
[place] [facility] [place] NAME: Samantha Mcmillan    MRN#:  [id number]  DATE OF BIRTH:  1955/06/27  SUBJECTIVE:  Hospital Day: 1 day Samantha Mcmillan is a 60 y.o. female presenting with Shortness of Breath .   Overnight events: No acute overnight events Interval Events: Still complains of lower extremity edema, weakness, shortness of breath  REVIEW OF SYSTEMS:  CONSTITUTIONAL: No fever, fatigue or weakness.  EYES: No blurred or double vision.  EARS, NOSE, AND THROAT: No tinnitus or ear pain.  RESPIRATORY: No coughPositive, shortness of breath, denies wheezing or hemoptysis.  CARDIOVASCULAR: No chest pain, orthopnea,Positive edema.  GASTROINTESTINAL: No nausea, vomiting, diarrhea or abdominal pain.  GENITOURINARY: No dysuria, hematuria.  ENDOCRINE: No polyuria, nocturia,  HEMATOLOGY: No anemia, easy bruising or bleeding SKIN: No rash or lesion. MUSCULOSKELETAL: No joint pain or arthritis.   NEUROLOGIC: No tingling, numbness, weakness.  PSYCHIATRY: No anxiety or depression.   DRUG ALLERGIES:   Allergies  Allergen Reactions  . Fish Allergy Anaphylaxis and Swelling  . Iodine Shortness Of Breath  . Latex Anaphylaxis  . Other Rash, Other (See Comments), Shortness Of Breath, Anaphylaxis and Swelling    Pt states that she is allergic to Darvocet.   . Oxycodone-Acetaminophen Other (See Comments), Rash, Swelling and Shortness Of Breath    wheezing wheezing  . Percocet [Oxycodone-Acetaminophen] Shortness Of Breath  . Shellfish Allergy Anaphylaxis and Swelling  . Shellfish-Derived Products Anaphylaxis, Rash, Shortness Of Breath and Swelling    Throat closes.  . Amitriptyline Other (See Comments)    Other reaction(s): Other (See Comments) Altered mental status, agitation Reaction:  Mental changes   . Fish-Derived Products Other (See Comments) and Swelling    "respiratory distress and wheezing"  . Tape Other (See Comments)    Pt states  that it pulls her skin off.  Pt states that it pulls her skin off.   . Wellbutrin [Bupropion] Other (See Comments)    Reaction:  Mental changes   . Augmentin [Amoxicillin-Pot Clavulanate] Rash and Other (See Comments)    Has patient had a PCN reaction causing immediate rash, facial/tongue/throat swelling, SOB or lightheadedness with hypotension: No Has patient had a PCN reaction causing severe rash involving mucus membranes or skin necrosis: No Has patient had a PCN reaction that required hospitalization No Has patient had a PCN reaction occurring within the last 10 years: No If all of the above answers are "NO", then may proceed with Cephalosporin use.  . Codeine Rash  . Cortisone Rash  . Diphenhydramine Rash  . Vicodin [Hydrocodone-Acetaminophen] Rash and Other (See Comments)    Reaction:  Dizziness      VITALS:  Blood pressure (!) 169/68, pulse 74, temperature 98.2 F (36.8 C), temperature source Oral, resp. rate 18, weight 86.9 kg (191 lb 8 oz), SpO2 100 %.  PHYSICAL EXAMINATION:  VITAL SIGNS: Vitals:   12/28/15 0800 12/28/15 1226  BP: (!) 157/61 (!) 169/68  Pulse: 82 74  Resp: 18   Temp:     GENERAL:60 y.o.female currently in no acute distress.  HEAD: Normocephalic, atraumatic.  EYES: Pupils equal, round, reactive to light. Extraocular muscles intact. No scleral icterus.  MOUTH: Moist mucosal membrane. Dentition intact. No abscess noted.  EAR, NOSE, THROAT: Clear without exudates. No external lesions.  NECK: Supple. No thyromegaly. No nodules. No JVD.  PULMONARY: Basilar coarse rhonchi without wheeze No use of accessory muscles, Good respiratory effort. good air entry bilaterally CHEST: Nontender to palpation.  CARDIOVASCULAR: S1 and S2. Regular rate and rhythm. No murmurs, rubs, or gallops. No edema. Pedal pulses 2+ bilaterally.  GASTROINTESTINAL: Soft, nontender, nondistended. No masses. Positive bowel sounds. No hepatosplenomegaly.  MUSCULOSKELETAL: No swelling,  clubbing, or edema. Range of motion full in all extremities.  NEUROLOGIC: Cranial nerves II through XII are intact. No gross focal neurological deficits. Sensation intact. Reflexes intact.  SKIN: No ulceration, lesions, rashes, or cyanosis. Skin warm and dry. Turgor intact.  PSYCHIATRIC: Mood, affect within normal limits. The patient is awake, alert and oriented x 3. Insight, judgment intact.      LABORATORY PANEL:   CBC  Recent Labs Lab 12/28/15 0503  WBC 3.0*  HGB 12.9  HCT 37.8  PLT 94*   ------------------------------------------------------------------------------------------------------------------  Chemistries   Recent Labs Lab 12/27/15 1415 12/28/15 0503  NA 138 140  K 2.5* 3.1*  CL 101 104  CO2 31 32  GLUCOSE 179* 100*  BUN 9 9  CREATININE 0.85 0.66  CALCIUM 7.9* 7.7*  MG 1.5*  --   AST 60*  --   ALT 38  --   ALKPHOS 234*  --   BILITOT 0.5  --    ------------------------------------------------------------------------------------------------------------------  Cardiac Enzymes  Recent Labs Lab 12/27/15 1415  TROPONINI <0.03   ------------------------------------------------------------------------------------------------------------------  RADIOLOGY:  Dg Chest 2 View  Result Date: 12/27/2015 CLINICAL DATA:  CHF. EXAM: CHEST  2 VIEW COMPARISON:  10/31/2015. FINDINGS: Cardiomegaly with mild bilateral from interstitial prominence consistent with congestive heart failure. No pleural effusion or pneumothorax. Cervical spine fusion. Stable lower thoracic vertebral body mild compression fracture . IMPRESSION: Congestive heart failure with mild pulmonary interstitial edema. Electronically Signed   By: Marcello Moores  Register   On: 12/27/2015 17:00    EKG:   Orders placed or performed during the hospital encounter of 12/27/15  . ED EKG  . ED EKG  . EKG 12-Lead  . EKG 12-Lead    ASSESSMENT AND PLAN:   Samantha Mcmillan is a 60 y.o. female presenting with  Shortness of Breath . Admitted 12/27/2015 : Day #: 1 day 1. Acute on chronic diastolic congestive heart failure: Restart IV Lasix, continue with diuresis 2. Alcoholic cirrhosis Lasix spironolactone 3. Type 2 diabetes non-insulin-requiring: Sliding scale coverage 4. COPD not in exacerbation: Home medications 5. Hypothyroidism unspecified: Synthroid   All the records are reviewed and case discussed with Care Management/Social Workerr. Management plans discussed with the patient, family and they are in agreement.  CODE STATUS: full TOTAL TIME TAKING CARE OF THIS PATIENT: 33 minutes.   POSSIBLE D/C IN 1DAYS, DEPENDING ON CLINICAL CONDITION.   Samantha Mcmillan,  Samantha Mcmillan.D on 12/28/2015 at 1:44 PM  Between 7am to 6pm - Pager - [phone]  After 6pm: House Pager: - [phone]  [facility]  Office  [phone]  CC: Primary care physician; Samantha Mcmillan

## 2015-12-28 NOTE — Care Management Obs Status (Signed)
[facility] NOTIFICATION   Patient Details  Name: Samantha Mcmillan MRN: [id number] Date of Birth: [birth year]   Medicare Observation Status Notification Given:  Yes    CrutchfieldAntony Haste, RN 12/28/2015, 7:24 PM

## 2015-12-28 NOTE — Progress Notes (Signed)
fsbs 50 / RN to bedside to assess/ no distress noted/ pt a/o/ orange juice given for pt to drink/ post fsbs 107/ will continue to monitor.

## 2015-12-28 NOTE — Consult Note (Signed)
CH received order for prayer. PT was warm and receptive to conversation and prayer with Ch. CH was with PT for nearly 20 min. Closed time together with prayer.

## 2015-12-29 DIAGNOSIS — I110 Hypertensive heart disease with heart failure: Secondary | ICD-10-CM | POA: Diagnosis not present

## 2015-12-29 DIAGNOSIS — R0602 Shortness of breath: Secondary | ICD-10-CM | POA: Diagnosis not present

## 2015-12-29 LAB — GLUCOSE, CAPILLARY
GLUCOSE-CAPILLARY: 75 mg/dL (ref 65–99)
GLUCOSE-CAPILLARY: 170 mg/dL — AB (ref 65–99)
Glucose-Capillary: 158 mg/dL — ABNORMAL HIGH (ref 65–99)
Glucose-Capillary: 247 mg/dL — ABNORMAL HIGH (ref 65–99)

## 2015-12-29 MED ORDER — POTASSIUM CHLORIDE CRYS ER 20 MEQ PO TBCR
20.0000 meq | EXTENDED_RELEASE_TABLET | Freq: Once | ORAL | Status: AC
  Administered 2015-12-29: 20 meq via ORAL
  Filled 2015-12-29: qty 1

## 2015-12-29 NOTE — Progress Notes (Signed)
[place] [facility] [place] NAME: Samantha Mcmillan    MRN#:  [id number]  DATE OF BIRTH:  06-Mar-1955  SUBJECTIVE:  Hospital Day: 1 day Samantha Mcmillan is a 60 y.o. female presenting with Shortness of Breath .   Overnight events: No acute overnight events Interval Events: Still complains of lower extremity edema, weakness, shortness of breath  REVIEW OF SYSTEMS:  CONSTITUTIONAL: No fever, fatigue or weakness.  EYES: No blurred or double vision.  EARS, NOSE, AND THROAT: No tinnitus or ear pain.  RESPIRATORY: No coughPositive, shortness of breath, denies wheezing or hemoptysis.  CARDIOVASCULAR: No chest pain, orthopnea,Positive edema.  GASTROINTESTINAL: No nausea, vomiting, diarrhea or abdominal pain.  GENITOURINARY: No dysuria, hematuria.  ENDOCRINE: No polyuria, nocturia,  HEMATOLOGY: No anemia, easy bruising or bleeding SKIN: No rash or lesion. MUSCULOSKELETAL: No joint pain or arthritis.   NEUROLOGIC: No tingling, numbness, weakness.  PSYCHIATRY: No anxiety or depression.   DRUG ALLERGIES:   Allergies  Allergen Reactions  . Fish Allergy Anaphylaxis and Swelling  . Iodine Shortness Of Breath  . Latex Anaphylaxis  . Other Rash, Other (See Comments), Shortness Of Breath, Anaphylaxis and Swelling    Pt states that she is allergic to Darvocet.   . Oxycodone-Acetaminophen Other (See Comments), Rash, Swelling and Shortness Of Breath    wheezing wheezing  . Percocet [Oxycodone-Acetaminophen] Shortness Of Breath  . Shellfish Allergy Anaphylaxis and Swelling  . Shellfish-Derived Products Anaphylaxis, Rash, Shortness Of Breath and Swelling    Throat closes.  . Amitriptyline Other (See Comments)    Other reaction(s): Other (See Comments) Altered mental status, agitation Reaction:  Mental changes   . Fish-Derived Products Other (See Comments) and Swelling    "respiratory distress and wheezing"  . Tape Other (See Comments)    Pt states  that it pulls her skin off.  Pt states that it pulls her skin off.   . Wellbutrin [Bupropion] Other (See Comments)    Reaction:  Mental changes   . Augmentin [Amoxicillin-Pot Clavulanate] Rash and Other (See Comments)    Has patient had a PCN reaction causing immediate rash, facial/tongue/throat swelling, SOB or lightheadedness with hypotension: No Has patient had a PCN reaction causing severe rash involving mucus membranes or skin necrosis: No Has patient had a PCN reaction that required hospitalization No Has patient had a PCN reaction occurring within the last 10 years: No If all of the above answers are "NO", then may proceed with Cephalosporin use.  . Codeine Rash  . Cortisone Rash  . Diphenhydramine Rash  . Vicodin [Hydrocodone-Acetaminophen] Rash and Other (See Comments)    Reaction:  Dizziness      VITALS:  Blood pressure (!) 161/67, pulse 65, temperature 98.1 F (36.7 C), temperature source Oral, resp. rate 18, weight 86.5 kg (190 lb 11.2 oz), SpO2 94 %.  PHYSICAL EXAMINATION:  VITAL SIGNS: Vitals:   12/29/15 0820 12/29/15 1134  BP: (!) 165/56 (!) 161/67  Pulse: 64 65  Resp: 15 18  Temp: 97.4 F (36.3 C) 98.1 F (36.7 C)   GENERAL:60 y.o.female currently in no acute distress.  HEAD: Normocephalic, atraumatic.  EYES: Pupils equal, round, reactive to light. Extraocular muscles intact. No scleral icterus.  MOUTH: Moist mucosal membrane. Dentition intact. No abscess noted.  EAR, NOSE, THROAT: Clear without exudates. No external lesions.  NECK: Supple. No thyromegaly. No nodules. No JVD.  PULMONARY: Basilar coarse rhonchi without wheeze No use of accessory muscles, Good respiratory effort. good air entry  bilaterally CHEST: Nontender to palpation.  CARDIOVASCULAR: S1 and S2. Regular rate and rhythm. No murmurs, rubs, or gallops. No edema. Pedal pulses 2+ bilaterally.  GASTROINTESTINAL: Soft, nontender, nondistended. No masses. Positive bowel sounds. No hepatosplenomegaly.   MUSCULOSKELETAL: No swelling, clubbing, or edema. Range of motion full in all extremities.  NEUROLOGIC: Cranial nerves II through XII are intact. No gross focal neurological deficits. Sensation intact. Reflexes intact.  SKIN: No ulceration, lesions, rashes, or cyanosis. Skin warm and dry. Turgor intact.  PSYCHIATRIC: Mood, affect within normal limits. The patient is awake, alert and oriented x 3. Insight, judgment intact.      LABORATORY PANEL:   CBC  Recent Labs Lab 12/28/15 0503  WBC 3.0*  HGB 12.9  HCT 37.8  PLT 94*   ------------------------------------------------------------------------------------------------------------------  Chemistries   Recent Labs Lab 12/27/15 1415 12/28/15 0503  NA 138 140  K 2.5* 3.1*  CL 101 104  CO2 31 32  GLUCOSE 179* 100*  BUN 9 9  CREATININE 0.85 0.66  CALCIUM 7.9* 7.7*  MG 1.5*  --   AST 60*  --   ALT 38  --   ALKPHOS 234*  --   BILITOT 0.5  --    ------------------------------------------------------------------------------------------------------------------  Cardiac Enzymes  Recent Labs Lab 12/27/15 1415  TROPONINI <0.03   ------------------------------------------------------------------------------------------------------------------  RADIOLOGY:  Dg Chest 2 View  Result Date: 12/27/2015 CLINICAL DATA:  CHF. EXAM: CHEST  2 VIEW COMPARISON:  10/31/2015. FINDINGS: Cardiomegaly with mild bilateral from interstitial prominence consistent with congestive heart failure. No pleural effusion or pneumothorax. Cervical spine fusion. Stable lower thoracic vertebral body mild compression fracture . IMPRESSION: Congestive heart failure with mild pulmonary interstitial edema. Electronically Signed   By: Marcello Moores  Register   On: 12/27/2015 17:00    EKG:   Orders placed or performed during the hospital encounter of 12/27/15  . ED EKG  . ED EKG  . EKG 12-Lead  . EKG 12-Lead    ASSESSMENT AND PLAN:   Samantha Mcmillan is a 60  y.o. female presenting with Shortness of Breath . Admitted 12/27/2015 : Day #: 1 day 1. Acute on chronic diastolic congestive heart failure: continue IV Lasix, continue with diuresis 2. Alcoholic cirrhosis Lasix spironolactone 3. Type 2 diabetes non-insulin-requiring: Sliding scale coverage 4. COPD not in exacerbation: Home medications 5. Hypothyroidism unspecified: Synthroid   All the records are reviewed and case discussed with Care Management/Social Workerr. Management plans discussed with the patient, family and they are in agreement.  CODE STATUS: full TOTAL TIME TAKING CARE OF THIS PATIENT: 28 minutes.   POSSIBLE D/C IN 1DAYS, DEPENDING ON CLINICAL CONDITION.   Hower,  Karenann Cai.D on 12/29/2015 at 12:47 PM  Between 7am to 6pm - Pager - [phone]  After 6pm: House Pager: - [phone]  [facility]  Office  [phone]  CC: Primary care physician; Casilda Carls

## 2015-12-29 NOTE — Consult Note (Signed)
[place]  CARDIOLOGY CONSULT NOTE  Patient ID: Samantha Mcmillan MRN: [id number] DOB/AGE: 1955/03/24 60 y.o.  Admit date: 12/27/2015 Referring Physician Dr. Lavetta Nielsen Primary Physician  Dr.  Darl Householder Cardiologist Dr. Nehemiah Massed Reason for Consultation Diastolic chf  HPI: Patient is a 60 year old female with history of diastolic heart failure with an ejection fraction of greater than 50%, history of idiopathic cirrhosis followed at [facility] transplant center, history of COPD, diabetes, hypertension and venous stasis who is admitted with increasing peripheral edema, shortness of breath. Chest x-ray was read as showing mild bilateral interstitial prominence consistent with congestive heart failure with no pleural effusion. Electrocardiogram showed normal sinus rhythm with no ischemia. Her serum troponin was normal. Since admission she has diuresed approximately 3-1/2 L. Renal function has remained stable. She was significantly hypokalemic and this has improved somewhat. She denies syncope or presyncope Complaints of leg pain. Patient continues to smoke fairly heavily. Diuresed with Bumex as an outpatient. The aggressiveness of her diuresis is limited by hyponatremia. She is somewhat improved since admission.  Review of Systems  HENT: Negative.   Eyes: Negative.   Respiratory: Positive for shortness of breath.   Cardiovascular: Positive for leg swelling.  Gastrointestinal: Negative.   Genitourinary: Negative.   Musculoskeletal: Positive for myalgias.  Skin: Negative.   Neurological: Positive for weakness.  Endo/Heme/Allergies: Bruises/bleeds easily.  Psychiatric/Behavioral: Negative.     Past Medical History:  Diagnosis Date  . Acute on chronic diastolic CHF (congestive heart failure) ([place]) 05/16/2014  . Anemia   . Anxiety   . Arthritis    "some in my feet" (05/04/2014)  . Asthma   . Chronic bronchitis ([place])    "get it pretty much q yr"   .  Chronic lower back pain   . Chronic respiratory failure ([place])   . Cirrhosis of liver ([place])   . Cirrhosis of liver ([place])   . Colon polyp   . COPD (chronic obstructive pulmonary disease) ([place])   . Depression   . Family history of adverse reaction to anesthesia    "my sister doesn't wake up good when she's put deep to sleep"  . GERD (gastroesophageal reflux disease)   . Headache    "more than a couple times/wk" (05/04/2014)  . Heart murmur   . High cholesterol   . History of blood transfusion    "related to my anemia"  . Hypertension   . IBS (irritable bowel syndrome)   . IDA (iron deficiency anemia) 08/14/2014  . Iron deficiency anemia   . OCD (obsessive compulsive disorder)   . On home oxygen therapy    "2L; 24/7" (05/04/2014)  . Panic attack   . Personal history of tobacco use, presenting hazards to health 06/21/2015  . Pneumonia "several times"  . PTSD (post-traumatic stress disorder)   . Sleep apnea    "can't tolerate CPAP" (05/04/2014)  . Type II diabetes mellitus ([facility])     Family History  Problem Relation Age of Onset  . Cancer Father     pancreatic  . Cancer Brother   . Breast cancer Maternal Aunt     Social History   Social History  . Marital status: Divorced    Spouse name: N/A  . Number of children: N/A  . Years of education: N/A   Occupational History  . disabled    Social History Main Topics  . Smoking status: Current Every Day Smoker    Packs/day: 0.50    Years: 48.00  Types: Cigarettes  . Smokeless tobacco: Never Used  . Alcohol use No  . Drug use: No  . Sexual activity: Not Currently   Other Topics Concern  . Not on file   Social History Narrative  . No narrative on file    Past Surgical History:  Procedure Laterality Date  . BACK SURGERY    . CATARACT EXTRACTION W/ INTRAOCULAR LENS  IMPLANT, BILATERAL Bilateral 2005  . CESAREAN SECTION  1979  . COLONOSCOPY  2014  . DILATION AND CURETTAGE OF UTERUS    . INCISION AND DRAINAGE OF WOUND  Right 2009   "leg mauled  by dog"  . PERIPHERAL VASCULAR CATHETERIZATION N/A 08/16/2014   Procedure: PICC Line Insertion;  Surgeon: Algernon Huxley, MD;  Location: [facility];  Service: Cardiovascular;  Laterality: N/A;  . POSTERIOR FUSION CERVICAL SPINE  2015   "rebuilt 3 of my neck vertebrae"  . TUBAL LIGATION  1979  . UPPER GASTROINTESTINAL ENDOSCOPY       Prescriptions Prior to Admission  Medication Sig Dispense Refill Last Dose  . albuterol (PROVENTIL HFA;VENTOLIN HFA) 108 (90 BASE) MCG/ACT inhaler Inhale 2 puffs into the lungs every 4 (four) hours as needed for wheezing or shortness of breath.   PRN at PRN  . albuterol (PROVENTIL) (2.5 MG/3ML) 0.083% nebulizer solution Take 2.5 mg by nebulization every 6 (six) hours as needed for wheezing or shortness of breath.   PRN at PRN  . allopurinol (ZYLOPRIM) 100 MG tablet Take 100 mg by mouth daily.    12/27/2015 at am  . ARIPiprazole (ABILIFY) 5 MG tablet Take 5 mg by mouth at bedtime.    12/26/2015 at pm  . atorvastatin (LIPITOR) 10 MG tablet Take 10 mg by mouth at bedtime.    12/26/2015 at pm  . budesonide-formoterol (SYMBICORT) 80-4.5 MCG/ACT inhaler Inhale 2 puffs into the lungs 2 (two) times daily.    12/27/2015 at am  . bumetanide (BUMEX) 1 MG tablet Take 1 mg by mouth 2 (two) times daily.   12/27/2015 at am  . ferrous fumarate-iron polysaccharide complex (TANDEM) 162-115.2 MG CAPS capsule Take 1 capsule by mouth 2 (two) times daily after a meal. 60 capsule 6 12/27/2015 at am  . fluticasone (FLONASE) 50 MCG/ACT nasal spray Place 1-2 sprays into both nostrils daily as needed for rhinitis.   PRN at PRN  . HYDROmorphone (DILAUDID) 2 MG tablet Take 1 tablet (2 mg total) by mouth every 6 (six) hours as needed for severe pain. (Patient taking differently: Take 2 mg by mouth every 12 (twelve) hours as needed for severe pain. ) 30 tablet 0 PRN at PRN  . lactulose (CHRONULAC) 10 GM/15ML solution Take 20 g by mouth 2 (two) times daily.    12/27/2015  at am  . lamoTRIgine (LAMICTAL) 150 MG tablet Take 150 mg by mouth at bedtime.    12/26/2015 at pm  . levothyroxine (SYNTHROID, LEVOTHROID) 88 MCG tablet Take 88 mcg by mouth daily before breakfast.    12/27/2015 at am  . metoCLOPramide (REGLAN) 10 MG tablet Take 10 mg by mouth 4 (four) times daily as needed for nausea or vomiting.    PRN at PRN  . metoprolol (LOPRESSOR) 50 MG tablet Take 50 mg by mouth 2 (two) times daily.   12/27/2015 at 0930  . Multiple Vitamin (MULTIVITAMIN WITH MINERALS) TABS tablet Take 1 tablet by mouth daily.   12/27/2015 at am  . pantoprazole (PROTONIX) 40 MG tablet Take 40 mg by mouth  daily.   12/27/2015 at am  . senna-docusate (SENOKOT-S) 8.6-50 MG tablet Take 1 tablet by mouth 2 (two) times daily.   12/27/2015 at am  . sertraline (ZOLOFT) 100 MG tablet Take 100 mg by mouth 2 (two) times daily.    12/27/2015 at am  . Buprenorphine (BUTRANS) 15 MCG/HR PTWK Place 20 mcg onto the skin once a week. Pt applies on Sunday. (Patient not taking: Reported on 12/27/2015) 1 patch 0 Not Taking at Unknown time    Physical Exam: Blood pressure (!) 165/56, pulse 64, temperature 97.4 F (36.3 C), temperature source Oral, resp. rate 15, weight 86.5 kg (190 lb 11.2 oz), SpO2 97 %.   Wt Readings from Last 1 Encounters:  12/29/15 86.5 kg (190 lb 11.2 oz)     General appearance: alert and cooperative Resp: rhonchi bibasilar Cardio: regular rate and rhythm GI: soft, non-tender; bowel sounds normal; no masses,  no organomegaly Extremities: edema 4+ edema bilaterally Neurologic: Grossly normal  Labs:   Lab Results  Component Value Date   WBC 3.0 (L) 12/28/2015   HGB 12.9 12/28/2015   HCT 37.8 12/28/2015   MCV 90.2 12/28/2015   PLT 94 (L) 12/28/2015    Recent Labs Lab 12/27/15 1415 12/28/15 0503  NA 138 140  K 2.5* 3.1*  CL 101 104  CO2 31 32  BUN 9 9  CREATININE 0.85 0.66  CALCIUM 7.9* 7.7*  PROT 5.6*  --   BILITOT 0.5  --   ALKPHOS 234*  --   ALT 38  --   AST 60*  --    GLUCOSE 179* 100*   Lab Results  Component Value Date   CKTOTAL 38 05/16/2014   CKMB 0.6 01/21/2014   TROPONINI <0.03 12/27/2015      Radiology:Evidence pulmonary edema EKG: Normal sinus rhythm with no ischemia.  ASSESSMENT AND PLAN:  Patient is a 60 year old female with preserved LV function who has hepatic cirrhosis of the liver being followed a [facility] who is admitted with worsening peripheral edema and shortness of breath. Chest x-ray showed mild volume overload. Her lower extremity edema is a lot worse. She does have diagnosis of sleep apnea and is not compliant with CPAP. She also continues to smoke despite diagnosis of COPD. She has diuresed 3 L with some clinical improvement. We'll continue with careful diuresis following renal function, serum sodium and potassium. Compliance with CPAP and smoke cessation has been discussed. Would continue with diuresis. We'll follow with you. Signed: Teodoro Spray MD, [facility] 12/29/2015, 10:04 AM

## 2015-12-29 NOTE — Evaluation (Signed)
Physical Therapy Evaluation Patient Details Name: Samantha Mcmillan MRN: [id number] DOB: 1955-04-13 Today's Date: 12/29/2015   History of Present Illness  60 yo female with admission for CHF exacerbation and chronic stomach pain from idiopathic cirrhosis was assessed for PT.  Has caregiver at home 24/7 and has all equipment needed.  PMHx:  venous stasis, EF 50%, respiratorry failure, OCD, PTSD, DM, smoker.  Clinical Impression  Pt is up to walk with RW, provided her with cues and direction for safety and limit setting.  Pt is tolerating gait on hall with sats at 92% initially, then up to 93% walking and then 97% at rest, overall improving.  Her plan is to get home with caregiver and continue PT as prior to her admission.  Acute PT to follow for gait and strengthening to ease her transition home.    Follow Up Recommendations Home health PT;Supervision for mobility/OOB    Equipment Recommendations  None recommended by PT    Recommendations for Other Services       Precautions / Restrictions Precautions Precautions: Fall (telemetry) Restrictions Weight Bearing Restrictions: No      Mobility  Bed Mobility               General bed mobility comments: up when PT entered  Transfers Overall transfer level: Needs assistance Equipment used: Rolling walker (2 wheeled);1 person hand held assist Transfers: Sit to/from [facility] Sit to Stand: Min guard;Min assist Stand pivot transfers: Min guard       General transfer comment: cued hand placement to sit safely  Ambulation/Gait Ambulation/Gait assistance: Min assist Ambulation Distance (Feet): 175 Feet Assistive device: Rolling walker (2 wheeled);1 person hand held assist Gait Pattern/deviations: Step-through pattern;Decreased stride length;Wide base of support;Trunk flexed Gait velocity: reduced   General Gait Details: paced gait with controlled turns on RW  Stairs            Wheelchair Mobility     Modified Rankin (Stroke Patients Only)       Balance Overall balance assessment: Needs assistance Sitting-balance support: Feet supported Sitting balance-Leahy Scale: Fair   Postural control: Posterior lean Standing balance support: Bilateral upper extremity supported Standing balance-Leahy Scale: Poor                               Pertinent Vitals/Pain Pain Assessment: 0-10 Pain Score: 4  Pain Location: central abdomen Pain Descriptors / Indicators: Aching Pain Intervention(s): Monitored during session;Repositioned    Home Living Family/patient expects to be discharged to:: Private residence Living Arrangements: Spouse/significant other Available Help at Discharge: Family;Available 24 hours/day Type of Home: House Home Access: Stairs to enter Entrance Stairs-Rails: Right Entrance Stairs-Number of Steps: 3 Home Layout: One level Home Equipment: Walker - 2 wheels;Walker - 4 wheels;Shower seat - built in      Prior Function Level of Independence: Independent with assistive device(s)         Comments: walk household distance with rw     Hand Dominance        Extremity/Trunk Assessment   Upper Extremity Assessment: Overall WFL for tasks assessed           Lower Extremity Assessment: Generalized weakness      Cervical / Trunk Assessment: Kyphotic  Communication   Communication: No difficulties  Cognition Arousal/Alertness: Awake/alert Behavior During Therapy: WFL for tasks assessed/performed Overall Cognitive Status: Within Functional Limits for tasks assessed  General Comments General comments (skin integrity, edema, etc.): has controlled walking with RW but is not safe to go without, reports furniture walking at home    Exercises     Assessment/Plan    PT Assessment Patient needs continued PT services  PT Problem List Decreased range of motion;Decreased strength;Decreased activity tolerance;Decreased  balance;Decreased mobility;Decreased coordination;Cardiopulmonary status limiting activity;Decreased skin integrity;Pain          PT Treatment Interventions Gait training;Stair training;Functional mobility training;Therapeutic activities;Therapeutic exercise;Balance training;Neuromuscular re-education;Patient/family education    PT Goals (Current goals can be found in the Care Plan section)  Acute Rehab PT Goals Patient Stated Goal: to feel better and get home PT Goal Formulation: With patient/family Time For Goal Achievement: 01/12/16 Potential to Achieve Goals: Good    Frequency Min 2X/week   Barriers to discharge Inaccessible home environment stairs to enter house    Co-evaluation               End of Session   Activity Tolerance: Patient tolerated treatment well;Patient limited by fatigue;Patient limited by pain Patient left: in chair;with call bell/phone within reach;with chair alarm set;with family/visitor present Nurse Communication: Mobility status    Functional Assessment Tool Used: clinical judgment Functional Limitation: Mobility: Walking and moving around Mobility: Walking and Moving Around Current Status [id number]): At least 20 percent but less than 40 percent impaired, limited or restricted Mobility: Walking and Moving Around Goal Status [phone]): At least 1 percent but less than 20 percent impaired, limited or restricted    Time: 1152-1230 PT Time Calculation (min) (ACUTE ONLY): 38 min   Charges:   PT Evaluation $PT Eval Low Complexity: 1 Procedure PT Treatments $Gait Training: 8-22 mins $Therapeutic Exercise: 8-22 mins   PT G Codes:   PT G-Codes **NOT FOR INPATIENT CLASS** Functional Assessment Tool Used: clinical judgment Functional Limitation: Mobility: Walking and moving around Mobility: Walking and Moving Around Current Status [id number]): At least 20 percent but less than 40 percent impaired, limited or restricted Mobility: Walking and Moving Around  Goal Status [phone]): At least 1 percent but less than 20 percent impaired, limited or restricted    Ramond Dial 12/29/2015, 1:19 PM    Mee Hives, PT MS Acute Rehab Dept. Number: [place] [facility] [place]

## 2015-12-30 DIAGNOSIS — I110 Hypertensive heart disease with heart failure: Secondary | ICD-10-CM | POA: Diagnosis not present

## 2015-12-30 DIAGNOSIS — R0602 Shortness of breath: Secondary | ICD-10-CM | POA: Diagnosis not present

## 2015-12-30 LAB — BASIC METABOLIC PANEL
Anion gap: 5 (ref 5–15)
BUN: 15 mg/dL (ref 6–20)
CALCIUM: 8.1 mg/dL — AB (ref 8.9–10.3)
CO2: 34 mmol/L — AB (ref 22–32)
CREATININE: 0.79 mg/dL (ref 0.44–1.00)
Chloride: 99 mmol/L — ABNORMAL LOW (ref 101–111)
GFR calc Af Amer: >60 mL/min (ref >60)
GLUCOSE: 130 mg/dL — AB (ref 65–99)
Potassium: 3.4 mmol/L — ABNORMAL LOW (ref 3.5–5.1)
Sodium: 138 mmol/L (ref 135–145)

## 2015-12-30 LAB — HEMOGLOBIN A1C
HEMOGLOBIN A1C: 8.7 % — AB (ref 4.8–5.6)
MEAN PLASMA GLUCOSE: 203 mg/dL

## 2015-12-30 LAB — GLUCOSE, CAPILLARY
GLUCOSE-CAPILLARY: 229 mg/dL — AB (ref 65–99)
Glucose-Capillary: 130 mg/dL — ABNORMAL HIGH (ref 65–99)

## 2015-12-30 MED ORDER — LIVING WELL WITH DIABETES BOOK
Freq: Once | Status: DC
  Filled 2015-12-30 (×2): qty 1

## 2015-12-30 MED ORDER — ACETAMINOPHEN 325 MG PO TABS
650.0000 mg | ORAL_TABLET | Freq: Four times a day (QID) | ORAL | Status: DC | PRN
  Administered 2015-12-30: 650 mg via ORAL
  Filled 2015-12-30: qty 2

## 2015-12-30 MED ORDER — INSULIN DEGLUDEC 100 UNIT/ML ~~LOC~~ SOPN: 20.0000 [IU] | PEN_INJECTOR | Freq: Every day | SUBCUTANEOUS | 0 refills | Status: AC

## 2015-12-30 NOTE — Progress Notes (Addendum)
Inpatient Diabetes Program Recommendations  AACE/ADA: New Consensus Statement on Inpatient Glycemic Control (2015)  Target Ranges:  Prepandial:   less than 140 mg/dL      Peak postprandial:   less than 180 mg/dL (1-2 hours)      Critically ill patients:  140 - 180 mg/dL   Results for SOMER, DUKE (MRN [id number]) as of 12/30/2015 10:14  Ref. Range 04/17/2015 04:32 12/28/2015 05:03  Hemoglobin A1C Latest Ref Range: 4.8 - 5.6 % 6.1 (H) 8.7 (H)  Results for ANGALINA, ROYE (MRN [id number]) as of 12/30/2015 10:14  Ref. Range 12/29/2015 07:17 12/29/2015 11:35 12/29/2015 16:50 12/29/2015 21:24 12/30/2015 07:43  Glucose-Capillary Latest Ref Range: 65 - 99 mg/dL 75 170 (H) 158 (H) 247 (H) 130 (H)     Review of Glycemic Control  Diabetes history: DM2 Outpatient Diabetes medications: none in chart listed, patient reports Tyler Aas ordered approximately 1 month ago - may go home on this at D/C.  Patient started at 20 units daily and has increased this to 45 units over this month. Current orders for Inpatient glycemic control: Novolog 0-9 units TIDAC and 0-5 untis QHS  Inpatient Diabetes Program Recommendations: A1C=8.7, so patient will need follow up with PCP to manage DM2 long-term, which patient plans to schedule.  Ordered Living Well with Diabetes book, RD consult, and outpatient education and support classes.    NOTE: Spoke with patient regarding: Spoke with patient regarding self-management of diabetes:  1.  Taught patient the following (teach back and/or return demonstration):  Medications at D/C (what these are, why taking, when taking, how taking)  CBG monitoring, A1C and why these are important  2.  Identified barriers and facilitators to self-management goals:  Patient states that DM was well controlled until recently when patient had worsening health, patient has good knowledge of diet and medication management, and patient keeps a great record of CBG's and BP's in a notebook.  Reviewed  record of CBG's and ran [phone].  3.  Identified support systems:  Patient did not mention any support people.  Thank you,  Windy Carina, RN, BSN Diabetes Coordinator Inpatient Diabetes Program [phone] (Team Pager)

## 2015-12-30 NOTE — Plan of Care (Signed)
Problem: Food- and Nutrition-Related Knowledge Deficit (NB-1.1) Goal: Nutrition education Formal process to instruct or train a patient/client in a skill or to impart knowledge to help patients/clients voluntarily manage or modify food choices and eating behavior to maintain or improve health. Outcome: Completed/Met Date Met: 12/30/15  RD consulted for nutrition education regarding diabetes.   Lab Results  Component Value Date   HGBA1C 8.7 (H) 12/28/2015    RD provided "Carbohydrate Counting for People with Diabetes" handout from the Academy of Nutrition and Dietetics. Discussed different food groups and their effects on blood sugar, emphasizing carbohydrate-containing foods. Provided list of carbohydrates and recommended serving sizes of common foods.  Discussed importance of controlled and consistent carbohydrate intake throughout the day. Provided examples of ways to balance meals/snacks and encouraged intake of high-fiber, whole grain complex carbohydrates. Teach back method used.  Expect fair compliance.  Body mass index is 33.21 kg/m. Pt meets criteria for obese based on current BMI.  Current diet order is carb mod, heart healthy, low sodium, patient is consuming approximately 50-100% of meals at this time. Labs and medications reviewed. No further nutrition interventions warranted at this time. RD contact information provided. If additional nutrition issues arise, please re-consult RD.  Samantha Mcmillan. Samantha Stern, MS, RD LDN Inpatient Clinical Dietitian Pager [phone]

## 2015-12-30 NOTE — Progress Notes (Signed)
Discharged to home with husband. Patient has orders for outpatient diabetic coordinator consult.  Medications have to changed.  Reviewed care of CHF.

## 2015-12-30 NOTE — Discharge Summary (Signed)
[name] NAME: Samantha Mcmillan    MR#:  [id number]  DATE OF BIRTH:  19-Nov-1955  DATE OF ADMISSION:  12/27/2015 ADMITTING PHYSICIAN: Lance Coon, MD  DATE OF DISCHARGE: 12/30/15  PRIMARY CARE PHYSICIAN: Casilda Carls    ADMISSION DIAGNOSIS:  SOB (shortness of breath) [R06.02] Congestive heart failure, unspecified congestive heart failure chronicity, unspecified congestive heart failure type ([place]) [I50.9]  DISCHARGE DIAGNOSIS:  Principal Problem:   Acute on chronic diastolic CHF (congestive heart failure) (HCC) Active Problems:   Hypothyroidism   DM2 (diabetes mellitus, type 2) ([place])   Hepatic cirrhosis (HCC)   Advanced COPD ([place])   Essential hypertension   Hypokalemia   Acute on chronic diastolic heart failure ([place])   SECONDARY DIAGNOSIS:   Past Medical History:  Diagnosis Date  . Acute on chronic diastolic CHF (congestive heart failure) ([place]) 05/16/2014  . Anemia   . Anxiety   . Arthritis    "some in my feet" (05/04/2014)  . Asthma   . Chronic bronchitis ([place])    "get it pretty much q yr"   . Chronic lower back pain   . Chronic respiratory failure ([place])   . Cirrhosis of liver ([place])   . Cirrhosis of liver ([place])   . Colon polyp   . COPD (chronic obstructive pulmonary disease) ([place])   . Depression   . Family history of adverse reaction to anesthesia    "my sister doesn't wake up good when she's put deep to sleep"  . GERD (gastroesophageal reflux disease)   . Headache    "more than a couple times/wk" (05/04/2014)  . Heart murmur   . High cholesterol   . History of blood transfusion    "related to my anemia"  . Hypertension   . IBS (irritable bowel syndrome)   . IDA (iron deficiency anemia) 08/14/2014  . Iron deficiency anemia   . OCD (obsessive compulsive disorder)   . On home oxygen therapy    "2L; 24/7" (05/04/2014)  . Panic attack   . Personal history of tobacco use, presenting hazards to health  06/21/2015  . Pneumonia "several times"  . PTSD (post-traumatic stress disorder)   . Sleep apnea    "can't tolerate CPAP" (05/04/2014)  . Type II diabetes mellitus [facility])     HOSPITAL COURSE:  Samantha Mcmillan  is a 60 y.o. female admitted 12/27/2015 with chief complaint Shortness of Breath . Please see H&P performed by Lance Coon, MD for further information. Patient presented with the above symptoms, found to have acute on chronic diastolic CHF. Cardiology consulted and assisted in diuresis. She has made significant improvement in volume status and symptoms. Of note she was also having issues with blood glucose at home. Stating she is currently on TRESIBA 45 units. While inpatient long acting insulin held with moderate control of glucose. She was seen by diabetic educator who recommended atleast oral agents. We discussed Metformin to which she stated she was intolerant. I have advised her to decrease her current insulin to 20 units and follow with PCP  DISCHARGE CONDITIONS:   stable  CONSULTS OBTAINED:  Treatment Team:  Teodoro Spray, MD  DRUG ALLERGIES:   Allergies  Allergen Reactions  . Fish Allergy Anaphylaxis and Swelling  . Iodine Shortness Of Breath  . Latex Anaphylaxis  . Other Rash, Other (See Comments), Shortness Of Breath, Anaphylaxis and Swelling    Pt states that she is allergic to Darvocet.   . Oxycodone-Acetaminophen  Other (See Comments), Rash, Swelling and Shortness Of Breath    wheezing wheezing  . Percocet [Oxycodone-Acetaminophen] Shortness Of Breath  . Shellfish Allergy Anaphylaxis and Swelling  . Shellfish-Derived Products Anaphylaxis, Rash, Shortness Of Breath and Swelling    Throat closes.  . Amitriptyline Other (See Comments)    Other reaction(s): Other (See Comments) Altered mental status, agitation Reaction:  Mental changes   . Fish-Derived Products Other (See Comments) and Swelling    "respiratory distress and wheezing"  . Tape Other (See Comments)      Pt states that it pulls her skin off.  Pt states that it pulls her skin off.   . Wellbutrin [Bupropion] Other (See Comments)    Reaction:  Mental changes   . Augmentin [Amoxicillin-Pot Clavulanate] Rash and Other (See Comments)    Has patient had a PCN reaction causing immediate rash, facial/tongue/throat swelling, SOB or lightheadedness with hypotension: No Has patient had a PCN reaction causing severe rash involving mucus membranes or skin necrosis: No Has patient had a PCN reaction that required hospitalization No Has patient had a PCN reaction occurring within the last 10 years: No If all of the above answers are "NO", then may proceed with Cephalosporin use.  . Codeine Rash  . Cortisone Rash  . Diphenhydramine Rash  . Vicodin [Hydrocodone-Acetaminophen] Rash and Other (See Comments)    Reaction:  Dizziness      DISCHARGE MEDICATIONS:   Current Discharge Medication List    START taking these medications   Details  insulin degludec (TRESIBA) 100 UNIT/ML SOPN FlexTouch Pen Inject 0.2 mLs (20 Units total) into the skin daily at 10 pm. Qty: 6 mL, Refills: 0      CONTINUE these medications which have NOT CHANGED   Details  albuterol (PROVENTIL HFA;VENTOLIN HFA) 108 (90 BASE) MCG/ACT inhaler Inhale 2 puffs into the lungs every 4 (four) hours as needed for wheezing or shortness of breath.    albuterol (PROVENTIL) (2.5 MG/3ML) 0.083% nebulizer solution Take 2.5 mg by nebulization every 6 (six) hours as needed for wheezing or shortness of breath.    allopurinol (ZYLOPRIM) 100 MG tablet Take 100 mg by mouth daily.     ARIPiprazole (ABILIFY) 5 MG tablet Take 5 mg by mouth at bedtime.     atorvastatin (LIPITOR) 10 MG tablet Take 10 mg by mouth at bedtime.     budesonide-formoterol (SYMBICORT) 80-4.5 MCG/ACT inhaler Inhale 2 puffs into the lungs 2 (two) times daily.     bumetanide (BUMEX) 1 MG tablet Take 1 mg by mouth 2 (two) times daily.    ferrous fumarate-iron polysaccharide  complex (TANDEM) 162-115.2 MG CAPS capsule Take 1 capsule by mouth 2 (two) times daily after a meal. Qty: 60 capsule, Refills: 6    fluticasone (FLONASE) 50 MCG/ACT nasal spray Place 1-2 sprays into both nostrils daily as needed for rhinitis.    HYDROmorphone (DILAUDID) 2 MG tablet Take 1 tablet (2 mg total) by mouth every 6 (six) hours as needed for severe pain. Qty: 30 tablet, Refills: 0    lactulose (CHRONULAC) 10 GM/15ML solution Take 20 g by mouth 2 (two) times daily.     lamoTRIgine (LAMICTAL) 150 MG tablet Take 150 mg by mouth at bedtime.     levothyroxine (SYNTHROID, LEVOTHROID) 88 MCG tablet Take 88 mcg by mouth daily before breakfast.     metoCLOPramide (REGLAN) 10 MG tablet Take 10 mg by mouth 4 (four) times daily as needed for nausea or vomiting.     metoprolol (  LOPRESSOR) 50 MG tablet Take 50 mg by mouth 2 (two) times daily.    Multiple Vitamin (MULTIVITAMIN WITH MINERALS) TABS tablet Take 1 tablet by mouth daily.    pantoprazole (PROTONIX) 40 MG tablet Take 40 mg by mouth daily.    senna-docusate (SENOKOT-S) 8.6-50 MG tablet Take 1 tablet by mouth 2 (two) times daily.    sertraline (ZOLOFT) 100 MG tablet Take 100 mg by mouth 2 (two) times daily.     Buprenorphine (BUTRANS) 15 MCG/HR PTWK Place 20 mcg onto the skin once a week. Pt applies on Sunday. Qty: 1 patch, Refills: 0         DISCHARGE INSTRUCTIONS:    DIET:  Diabetic diet  DISCHARGE CONDITION:  Good  ACTIVITY:  Activity as tolerated  OXYGEN:  Home Oxygen: No.   Oxygen Delivery: room air  DISCHARGE LOCATION:  home   If you experience worsening of your admission symptoms, develop shortness of breath, life threatening emergency, suicidal or homicidal thoughts you must seek medical attention immediately by calling 911 or calling your MD immediately  if symptoms less severe.  You Must read complete instructions/literature along with all the possible adverse reactions/side effects for all the  Medicines you take and that have been prescribed to you. Take any new Medicines after you have completely understood and accpet all the possible adverse reactions/side effects.   Please note  You were cared for by a hospitalist during your hospital stay. If you have any questions about your discharge medications or the care you received while you were in the hospital after you are discharged, you can call the unit and asked to speak with the hospitalist on call if the hospitalist that took care of you is not available. Once you are discharged, your primary care physician will handle any further medical issues. Please note that NO REFILLS for any discharge medications will be authorized once you are discharged, as it is imperative that you return to your primary care physician (or establish a relationship with a primary care physician if you do not have one) for your aftercare needs so that they can reassess your need for medications and monitor your lab values.    On the day of Discharge:   VITAL SIGNS:  Blood pressure (!) 167/55, pulse 65, temperature 98.2 F (36.8 C), temperature source Oral, resp. rate (!) 22, height 5\' 4" (1.626 m), weight 87.8 kg (193 lb 8 oz), SpO2 95 %.  I/O:   Intake/Output Summary (Last 24 hours) at 12/30/15 1128 Last data filed at 12/30/15 0700  Gross per 24 hour  Intake              480 ml  Output             32 00 ml  Net            -2720 ml    PHYSICAL EXAMINATION:  GENERAL:  60 y.o.-year-old patient lying in the bed with no acute distress.  EYES: Pupils equal, round, reactive to light and accommodation. No scleral icterus. Extraocular muscles intact.  HEENT: Head atraumatic, normocephalic. Oropharynx and nasopharynx clear.  NECK:  Supple, no jugular venous distention. No thyroid enlargement, no tenderness.  LUNGS: Normal breath sounds bilaterally, no wheezing, rales,rhonchi or crepitation. No use of accessory muscles of respiration.  CARDIOVASCULAR: S1, S2  normal. No murmurs, rubs, or gallops.  ABDOMEN: Soft, non-tender, non-distended. Bowel sounds present. No organomegaly or mass.  EXTREMITIES: trace edema, cyanosis, or clubbing.  NEUROLOGIC: Cranial nerves  II through XII are intact. Muscle strength 5/5 in all extremities. Sensation intact. Gait not checked.  PSYCHIATRIC: The patient is alert and oriented x 3.  SKIN: thin skin with multiple bruises, No obvious rash, lesion, or ulcer.   DATA REVIEW:   CBC  Recent Labs Lab 12/28/15 0503  WBC 3.0*  HGB 12.9  HCT 37.8  PLT 94*    Chemistries   Recent Labs Lab 12/27/15 1415  12/30/15 0641  NA 138  < > 138  K 2.5*  < > 3.4*  CL 101  < > 99*  CO2 31  < > 34*  GLUCOSE 179*  < > 130*  BUN 9  < > 15  CREATININE 0.85  < > 0.79  CALCIUM 7.9*  < > 8.1*  MG 1.5*  --   --   AST 60*  --   --   ALT 38  --   --   ALKPHOS 234*  --   --   BILITOT 0.5  --   --   < > = values in this interval not displayed.  Cardiac Enzymes  Recent Labs Lab 12/27/15 1415  TROPONINI <0.03    Microbiology Results  Results for orders placed or performed during the hospital encounter of 07/06/15  MRSA PCR Screening     Status: None   Collection Time: 07/06/15  7:44 PM  Result Value Ref Range Status   MRSA by PCR NEGATIVE NEGATIVE Final    Comment:        The GeneXpert MRSA Assay (FDA approved for NASAL specimens only), is one component of a comprehensive MRSA colonization surveillance program. It is not intended to diagnose MRSA infection nor to guide or monitor treatment for MRSA infections.     RADIOLOGY:  No results found.   Management plans discussed with the patient, family and they are in agreement.  CODE STATUS:     Code Status Orders        Start     Ordered   12/27/15 2304  Full code  Continuous     12/27/15 2303    Code Status History    Date Active Date Inactive Code Status Order ID Comments User Context   07/06/2015  7:57 PM 07/09/2015  5:14 PM Full Code [id number]   Idelle Crouch, MD Inpatient   04/16/2015  6:01 AM 04/17/2015  5:27 PM Full Code [id number]  Saundra Shelling, MD Inpatient   09/06/2014  4:01 PM 09/08/2014  2:39 PM Full Code [id number]  Demetrios Loll, MD Inpatient   08/26/2014 10:01 PM 08/29/2014  3:33 PM Full Code [id number]  Lytle Butte, MD ED   07/07/2014  1:38 AM 07/09/2014  3:57 PM Full Code [id number]  Juluis Mire, MD Inpatient   05/15/2014 10:45 PM 05/18/2014  6:07 PM Full Code [id number]  Allyne Gee, MD Inpatient   05/04/2014  4:03 PM 05/09/2014  4:55 PM Full Code [id number]  Rigoberto Noel, MD ED    Advance Directive Documentation   Flowsheet Row Most Recent Value  Type of Advance Directive  Healthcare Power of Attorney  Pre-existing out of facility DNR order (yellow form or pink MOST form)  No data  "MOST" Form in Place?  No data      TOTAL TIME TAKING CARE OF THIS PATIENT: 33 minutes.    Hower,  Karenann Cai.D on 12/30/2015 at 11:28 AM  Between 7am to 6pm - Pager - [phone]  After 6pm go to [email or web address] -  password Airline pilot  [facility]  Office  [phone]  CC: Primary care physician; Casilda Carls

## 2016-02-07 ENCOUNTER — Inpatient Hospital Stay: Payer: Medicare Other

## 2016-02-07 ENCOUNTER — Inpatient Hospital Stay: Payer: Medicare Other | Admitting: Internal Medicine

## 2016-02-26 ENCOUNTER — Inpatient Hospital Stay: Payer: Medicare Other | Attending: Hematology and Oncology | Admitting: Hematology and Oncology

## 2016-02-26 ENCOUNTER — Inpatient Hospital Stay: Payer: Medicare Other

## 2016-02-26 ENCOUNTER — Encounter: Payer: Self-pay | Admitting: Hematology and Oncology

## 2016-02-26 ENCOUNTER — Other Ambulatory Visit: Payer: Self-pay

## 2016-02-26 VITALS — BP 153/82 | HR 75 | Temp 97.7°F | Resp 21 | Wt 203.0 lb

## 2016-02-26 DIAGNOSIS — R609 Edema, unspecified: Secondary | ICD-10-CM | POA: Diagnosis not present

## 2016-02-26 DIAGNOSIS — J9610 Chronic respiratory failure, unspecified whether with hypoxia or hypercapnia: Secondary | ICD-10-CM | POA: Diagnosis not present

## 2016-02-26 DIAGNOSIS — M545 Low back pain: Secondary | ICD-10-CM | POA: Diagnosis not present

## 2016-02-26 DIAGNOSIS — K7460 Unspecified cirrhosis of liver: Secondary | ICD-10-CM | POA: Diagnosis not present

## 2016-02-26 DIAGNOSIS — K649 Unspecified hemorrhoids: Secondary | ICD-10-CM | POA: Diagnosis not present

## 2016-02-26 DIAGNOSIS — D696 Thrombocytopenia, unspecified: Secondary | ICD-10-CM

## 2016-02-26 DIAGNOSIS — M129 Arthropathy, unspecified: Secondary | ICD-10-CM | POA: Diagnosis not present

## 2016-02-26 DIAGNOSIS — F329 Major depressive disorder, single episode, unspecified: Secondary | ICD-10-CM | POA: Diagnosis not present

## 2016-02-26 DIAGNOSIS — J45909 Unspecified asthma, uncomplicated: Secondary | ICD-10-CM

## 2016-02-26 DIAGNOSIS — K589 Irritable bowel syndrome without diarrhea: Secondary | ICD-10-CM | POA: Diagnosis not present

## 2016-02-26 DIAGNOSIS — Z794 Long term (current) use of insulin: Secondary | ICD-10-CM

## 2016-02-26 DIAGNOSIS — E876 Hypokalemia: Secondary | ICD-10-CM

## 2016-02-26 DIAGNOSIS — J449 Chronic obstructive pulmonary disease, unspecified: Secondary | ICD-10-CM

## 2016-02-26 DIAGNOSIS — G8929 Other chronic pain: Secondary | ICD-10-CM | POA: Diagnosis not present

## 2016-02-26 DIAGNOSIS — K219 Gastro-esophageal reflux disease without esophagitis: Secondary | ICD-10-CM | POA: Diagnosis not present

## 2016-02-26 DIAGNOSIS — R011 Cardiac murmur, unspecified: Secondary | ICD-10-CM

## 2016-02-26 DIAGNOSIS — Z800 Family history of malignant neoplasm of digestive organs: Secondary | ICD-10-CM

## 2016-02-26 DIAGNOSIS — Z9981 Dependence on supplemental oxygen: Secondary | ICD-10-CM

## 2016-02-26 DIAGNOSIS — I5032 Chronic diastolic (congestive) heart failure: Secondary | ICD-10-CM

## 2016-02-26 DIAGNOSIS — I10 Essential (primary) hypertension: Secondary | ICD-10-CM | POA: Diagnosis not present

## 2016-02-26 DIAGNOSIS — F429 Obsessive-compulsive disorder, unspecified: Secondary | ICD-10-CM

## 2016-02-26 DIAGNOSIS — D509 Iron deficiency anemia, unspecified: Secondary | ICD-10-CM

## 2016-02-26 DIAGNOSIS — R188 Other ascites: Secondary | ICD-10-CM

## 2016-02-26 DIAGNOSIS — E7800 Pure hypercholesterolemia, unspecified: Secondary | ICD-10-CM | POA: Diagnosis not present

## 2016-02-26 DIAGNOSIS — F419 Anxiety disorder, unspecified: Secondary | ICD-10-CM

## 2016-02-26 DIAGNOSIS — D500 Iron deficiency anemia secondary to blood loss (chronic): Secondary | ICD-10-CM

## 2016-02-26 DIAGNOSIS — F17210 Nicotine dependence, cigarettes, uncomplicated: Secondary | ICD-10-CM | POA: Diagnosis not present

## 2016-02-26 DIAGNOSIS — Z79899 Other long term (current) drug therapy: Secondary | ICD-10-CM

## 2016-02-26 DIAGNOSIS — F410 Panic disorder [episodic paroxysmal anxiety] without agoraphobia: Secondary | ICD-10-CM

## 2016-02-26 DIAGNOSIS — D649 Anemia, unspecified: Secondary | ICD-10-CM | POA: Diagnosis not present

## 2016-02-26 DIAGNOSIS — Z86010 Personal history of colonic polyps: Secondary | ICD-10-CM

## 2016-02-26 DIAGNOSIS — R0602 Shortness of breath: Secondary | ICD-10-CM

## 2016-02-26 DIAGNOSIS — F4310 Post-traumatic stress disorder, unspecified: Secondary | ICD-10-CM

## 2016-02-26 DIAGNOSIS — Z803 Family history of malignant neoplasm of breast: Secondary | ICD-10-CM

## 2016-02-26 DIAGNOSIS — Z720 Tobacco use: Secondary | ICD-10-CM

## 2016-02-26 DIAGNOSIS — E119 Type 2 diabetes mellitus without complications: Secondary | ICD-10-CM

## 2016-02-26 LAB — COMPREHENSIVE METABOLIC PANEL
ALBUMIN: 2.7 g/dL — AB (ref 3.5–5.0)
ALK PHOS: 182 U/L — AB (ref 38–126)
ALT: 16 U/L (ref 14–54)
ANION GAP: 5 (ref 5–15)
AST: 32 U/L (ref 15–41)
BILIRUBIN TOTAL: 0.6 mg/dL (ref 0.3–1.2)
BUN: 17 mg/dL (ref 6–20)
CALCIUM: 7.9 mg/dL — AB (ref 8.9–10.3)
CO2: 32 mmol/L (ref 22–32)
Chloride: 99 mmol/L — ABNORMAL LOW (ref 101–111)
Creatinine, Ser: 0.92 mg/dL (ref 0.44–1.00)
GFR calc non Af Amer: >60 mL/min (ref >60)
GLUCOSE: 81 mg/dL (ref 65–99)
Potassium: 3.2 mmol/L — ABNORMAL LOW (ref 3.5–5.1)
SODIUM: 136 mmol/L (ref 135–145)
TOTAL PROTEIN: 6.1 g/dL — AB (ref 6.5–8.1)

## 2016-02-26 LAB — CBC WITH DIFFERENTIAL/PLATELET
BASOS ABS: 0 10*3/uL (ref 0–0.1)
BASOS PCT: 1 %
EOS PCT: 1 %
Eosinophils Absolute: 0.1 10*3/uL (ref 0–0.7)
HCT: 36.7 % (ref 35.0–47.0)
Hemoglobin: 12.5 g/dL (ref 12.0–16.0)
Lymphocytes Relative: 18 %
Lymphs Abs: 1.1 10*3/uL (ref 1.0–3.6)
MCH: 30.5 pg (ref 26.0–34.0)
MCHC: 33.9 g/dL (ref 32.0–36.0)
MCV: 89.8 fL (ref 80.0–100.0)
MONO ABS: 0.3 10*3/uL (ref 0.2–0.9)
Monocytes Relative: 6 %
Neutro Abs: 4.4 10*3/uL (ref 1.4–6.5)
Neutrophils Relative %: 74 %
PLATELETS: 128 10*3/uL — AB (ref 150–440)
RBC: 4.09 MIL/uL (ref 3.80–5.20)
RDW: 14.6 % — AB (ref 11.5–14.5)
WBC: 5.8 10*3/uL (ref 3.6–11.0)

## 2016-02-26 LAB — FERRITIN: Ferritin: 34 ng/mL (ref 11–307)

## 2016-02-26 MED ORDER — FERROUS FUM-IRON POLYSACCH 162-115.2 MG PO CAPS: 1.0000 | ORAL_CAPSULE | Freq: Two times a day (BID) | ORAL | 11 refills | Status: DC

## 2016-02-26 NOTE — Assessment & Plan Note (Signed)
This is likely related to her diuretic therapy I recommend potassium rich diet

## 2016-02-26 NOTE — Progress Notes (Signed)
[facility] progress notes  Patient Care Team: Casilda Carls as PCP - General (Internal Medicine) Seeplaputhur Robinette Haines, MD (General Surgery) Alisa Graff, FNP as Nurse Practitioner (Family Medicine) Lavonia Dana, MD as Consulting Physician (Nephrology) Nathanial Rancher, MD as Referring Physician (Anesthesiology) Leonel Ramsay, MD as Consulting Physician (Infectious Diseases)  CHIEF COMPLAINTS/PURPOSE OF VISIT:  Iron deficiency anemia and chronic thrombocytopenia from liver cirrhosis  HISTORY OF PRESENTING ILLNESS:  Samantha Mcmillan 61 y.o. female was transferred to my care after her prior physician is not available today.  I reviewed the patient's records extensive and collaborated the history with the patient. Summary of her history is as follows: This patient has chronic iron def anemia likely due to chronic GI bleed. She has history of AVM. She has received intermittent iv iron the past She has liver cirrhosis and that caused chronic thrombocytopenia, on close observation only  She feels well She has chronic intermittent hemorrhoidal bleeding. The patient denies any recent signs or symptoms of bleeding such as spontaneous epistaxis, hematuria or hemoptysis. She tolerated iron supplement without adverse GI symptoms She has chronic dyspnea from CHF. She continues to smoke She is on aggressive diuretic therapy due to excessive fluid retention and ascites  MEDICAL HISTORY:  Past Medical History:  Diagnosis Date  . Acute on chronic diastolic CHF (congestive heart failure) ([place]) 05/16/2014  . Anemia   . Anxiety   . Arthritis    "some in my feet" (05/04/2014)  . Asthma   . Chronic bronchitis ([place])    "get it pretty much q yr"   . Chronic lower back pain   . Chronic respiratory failure ([place])   . Cirrhosis of liver ([place])   . Cirrhosis of liver ([place])   . Colon polyp   . COPD (chronic obstructive pulmonary disease) ([place])   . Depression   . Family  history of adverse reaction to anesthesia    "my sister doesn't wake up good when she's put deep to sleep"  . GERD (gastroesophageal reflux disease)   . Headache    "more than a couple times/wk" (05/04/2014)  . Heart murmur   . High cholesterol   . History of blood transfusion    "related to my anemia"  . Hypertension   . IBS (irritable bowel syndrome)   . IDA (iron deficiency anemia) 08/14/2014  . Iron deficiency anemia   . OCD (obsessive compulsive disorder)   . On home oxygen therapy    "2L; 24/7" (05/04/2014)  . Panic attack   . Personal history of tobacco use, presenting hazards to health 06/21/2015  . Pneumonia "several times"  . PTSD (post-traumatic stress disorder)   . Sleep apnea    "can't tolerate CPAP" (05/04/2014)  . Type II diabetes mellitus ([place])     SURGICAL HISTORY: Past Surgical History:  Procedure Laterality Date  . BACK SURGERY    . CATARACT EXTRACTION W/ INTRAOCULAR LENS  IMPLANT, BILATERAL Bilateral 2005  . CESAREAN SECTION  1979  . COLONOSCOPY  2014  . DILATION AND CURETTAGE OF UTERUS    . INCISION AND DRAINAGE OF WOUND Right 2009   "leg mauled  by dog"  . PERIPHERAL VASCULAR CATHETERIZATION N/A 08/16/2014   Procedure: PICC Line Insertion;  Surgeon: Algernon Huxley, MD;  Location: [facility];  Service: Cardiovascular;  Laterality: N/A;  . POSTERIOR FUSION CERVICAL SPINE  2015   "rebuilt 3 of my neck vertebrae"  . TUBAL LIGATION  1979  .  UPPER GASTROINTESTINAL ENDOSCOPY      SOCIAL HISTORY: Social History   Social History  . Marital status: Divorced    Spouse name: N/A  . Number of children: N/A  . Years of education: N/A   Occupational History  . disabled    Social History Main Topics  . Smoking status: Current Every Day Smoker    Packs/day: 0.50    Years: 48.00    Types: Cigarettes  . Smokeless tobacco: Never Used  . Alcohol use No  . Drug use: No  . Sexual activity: Not Currently   Other Topics Concern  . Not on file   Social  History Narrative  . No narrative on file    FAMILY HISTORY: Family History  Problem Relation Age of Onset  . Cancer Father     pancreatic  . Cancer Brother   . Breast cancer Maternal Aunt     ALLERGIES:  is allergic to fish allergy; iodine; latex; other; oxycodone-acetaminophen; percocet [oxycodone-acetaminophen]; shellfish allergy; shellfish-derived products; amitriptyline; fish-derived products; tape; wellbutrin [bupropion]; augmentin [amoxicillin-pot clavulanate]; codeine; cortisone; diphenhydramine; and vicodin [hydrocodone-acetaminophen].  MEDICATIONS:  Current Outpatient Prescriptions  Medication Sig Dispense Refill  . albuterol (PROVENTIL HFA;VENTOLIN HFA) 108 (90 BASE) MCG/ACT inhaler Inhale 2 puffs into the lungs every 4 (four) hours as needed for wheezing or shortness of breath.    Marland Kitchen albuterol (PROVENTIL) (2.5 MG/3ML) 0.083% nebulizer solution Take 2.5 mg by nebulization every 6 (six) hours as needed for wheezing or shortness of breath.    . allopurinol (ZYLOPRIM) 100 MG tablet Take 100 mg by mouth daily.     . ARIPiprazole (ABILIFY) 5 MG tablet Take 5 mg by mouth at bedtime.     Marland Kitchen atorvastatin (LIPITOR) 10 MG tablet Take 10 mg by mouth at bedtime.     . budesonide-formoterol (SYMBICORT) 80-4.5 MCG/ACT inhaler Inhale 2 puffs into the lungs 2 (two) times daily.     . bumetanide (BUMEX) 1 MG tablet Take 1 mg by mouth 2 (two) times daily.    . ferrous fumarate-iron polysaccharide complex (TANDEM) 162-115.2 MG CAPS capsule Take 1 capsule by mouth 2 (two) times daily after a meal. 60 capsule 11  . fluticasone (FLONASE) 50 MCG/ACT nasal spray Place 1-2 sprays into both nostrils daily as needed for rhinitis.    Marland Kitchen HYDROmorphone (DILAUDID) 2 MG tablet Take 1 tablet (2 mg total) by mouth every 6 (six) hours as needed for severe pain. (Patient taking differently: Take 2 mg by mouth every 12 (twelve) hours as needed for severe pain. ) 30 tablet 0  . insulin glargine (LANTUS) 100 UNIT/ML  injection Inject into the skin at bedtime.    Marland Kitchen lactulose (CHRONULAC) 10 GM/15ML solution Take 20 g by mouth 2 (two) times daily.     Marland Kitchen lamoTRIgine (LAMICTAL) 150 MG tablet Take 150 mg by mouth at bedtime.     Marland Kitchen levothyroxine (SYNTHROID, LEVOTHROID) 88 MCG tablet Take 88 mcg by mouth daily before breakfast.     . metoCLOPramide (REGLAN) 10 MG tablet Take 10 mg by mouth 4 (four) times daily as needed for nausea or vomiting.     . metoprolol (LOPRESSOR) 50 MG tablet Take 50 mg by mouth 2 (two) times daily.    . Multiple Vitamin (MULTIVITAMIN WITH MINERALS) TABS tablet Take 1 tablet by mouth daily.    . pantoprazole (PROTONIX) 40 MG tablet Take 40 mg by mouth daily.    Marland Kitchen senna-docusate (SENOKOT-S) 8.6-50 MG tablet Take 1 tablet by mouth 2 (two)  times daily.    . sertraline (ZOLOFT) 100 MG tablet Take 100 mg by mouth 2 (two) times daily.     Marland Kitchen spironolactone (ALDACTONE) 25 MG tablet Take 25 mg by mouth daily.    . Buprenorphine (BUTRANS) 15 MCG/HR PTWK Place 20 mcg onto the skin once a week. Pt applies on Sunday. (Patient not taking: Reported on 02/26/2016) 1 patch 0   No current facility-administered medications for this visit.     REVIEW OF SYSTEMS:   Constitutional: Denies fevers, chills or abnormal night sweats Eyes: Denies blurriness of vision, double vision or watery eyes Ears, nose, mouth, throat, and face: Denies mucositis or sore throat Respiratory: Denies cough, or wheezes Gastrointestinal:  Denies nausea, heartburn or change in bowel habits Skin: Denies abnormal skin rashes Lymphatics: Denies new lymphadenopathy  Neurological:Denies numbness, tingling or new weaknesses Behavioral/Psych: Mood is stable, no new changes  All other systems were reviewed with the patient and are negative.  PHYSICAL EXAMINATION: ECOG PERFORMANCE STATUS: 2 - Symptomatic, <50% confined to bed  Vitals:   02/26/16 1136  BP: (!) 153/82  Pulse: 75  Resp: (!) 21  Temp: 97.7 F (36.5 C)   Filed Weights    02/26/16 1136  Weight: 203 lb 0.7 oz (92.1 kg)    GENERAL:alert, no distress and comfortable. She is obese SKIN: extensive bruising is noted EYES: normal, conjunctiva are pink and non-injected, sclera clear OROPHARYNX:no exudate, normal lips, buccal mucosa, and tongue  LUNGS:mildly increased breathing effort HEART: mild bilaterat lower extremity edema ABDOMEN:abdomen distended with ascites  PSYCH: alert & oriented x 3 with fluent speech NEURO: no focal motor/sensory deficits  LABORATORY DATA:  I have reviewed the data as listed Lab Results  Component Value Date   WBC 5.8 02/26/2016   HGB 12.5 02/26/2016   HCT 36.7 02/26/2016   MCV 89.8 02/26/2016   PLT 128 (L) 02/26/2016    Recent Labs  04/15/15 1425  11/12/15 1839 12/27/15 1415 12/28/15 0503 12/30/15 0641 02/26/16 1059  NA 126*  < > 128* 138 140 138 136  K 4.2  < > 3.6 2.5* 3.1* 3.4* 3.2*  CL 87*  < > 93* 101 104 99* 99*  CO2 30  < > 29 31 32 34* 32  GLUCOSE 203*  < > 132* 179* 100* 130* 81  BUN 51*  < > 22* 9 9 15 17   CREATININE 1.53*  < > 0.98 0.85 0.66 0.79 0.92  CALCIUM 8.9  < > 8.5* 7.9* 7.7* 8.1* 7.9*  GFRNONAA 36*  < > >60 >60 >60 >60 >60  GFRAA 41*  < > >60 >60 >60 >60 >60  PROT 6.8  < > 6.6 5.6*  --   --  6.1*  ALBUMIN 3.6  < > 2.9* 2.5*  --   --  2.7*  AST 28  < > 82* 60*  --   --  32  ALT 14  < > 60* 38  --   --  16  ALKPHOS 108  < > 248* 234*  --   --  182*  BILITOT 0.6  < > 5.4* 0.5  --   --  0.6  BILIDIR 0.2  --   --   --   --   --   --   IBILI 0.4  --   --   --   --   --   --   < > = values in this interval not displayed.  ASSESSMENT & PLAN:  IDA (  iron deficiency anemia) Anemia is resolved Iron study is still pending She appears to be tolerated oral iron supplement well I refilled her prescription today and recommend she continues the same  Thrombocytopenia [facility]) The cause could be related to her underlying medical condition with liver disease. It is mild and there is little change  compared from previous platelet count. The patient denies recent history of bleeding such as epistaxis, hematuria or hematochezia. She is asymptomatic from the thrombocytopenia. I will observe for now.    Tobacco use I spent some time counseling the patient the importance of tobacco cessation. We discussed common strategies including nicotine patches, Tobacco Quit-line, and other nicotine replacement products to assist in hereffort to quit  she appears motivated to quit on her own   Hypokalemia This is likely related to her diuretic therapy I recommend potassium rich diet   Orders Placed This Encounter  Procedures  . Ferritin    Standing Status:   Future    Standing Expiration Date:   02/25/2017  . CBC with Differential/Platelet    Standing Status:   Future    Standing Expiration Date:   04/01/2017    All questions were answered. The patient knows to call the clinic with any problems, questions or concerns. I spent 15 minutes counseling the patient face to face. The total time spent in the appointment was 20 minutes and more than 50% was on counseling.     Heath Lark, MD 02/26/2016 12:46 PM

## 2016-02-26 NOTE — Assessment & Plan Note (Signed)
I spent some time counseling the patient the importance of tobacco cessation. We discussed common strategies including nicotine patches, Tobacco Quit-line, and other nicotine replacement products to assist in hereffort to quit  she appears motivated to quit on her own.  

## 2016-02-26 NOTE — Assessment & Plan Note (Signed)
The cause could be related to her underlying medical condition with liver disease. It is mild and there is little change compared from previous platelet count. The patient denies recent history of bleeding such as epistaxis, hematuria or hematochezia. She is asymptomatic from the thrombocytopenia. I will observe for now.

## 2016-02-26 NOTE — Assessment & Plan Note (Signed)
Anemia is resolved Iron study is still pending She appears to be tolerated oral iron supplement well I refilled her prescription today and recommend she continues the same

## 2016-03-07 ENCOUNTER — Emergency Department: Payer: Medicare Other

## 2016-03-07 ENCOUNTER — Emergency Department
Admission: EM | Admit: 2016-03-07 | Discharge: 2016-03-07 | Disposition: A | Discharge status: Home / Self Care | Payer: Medicare Other | Attending: Emergency Medicine | Admitting: Emergency Medicine

## 2016-03-07 DIAGNOSIS — J45909 Unspecified asthma, uncomplicated: Secondary | ICD-10-CM | POA: Diagnosis not present

## 2016-03-07 DIAGNOSIS — Z79899 Other long term (current) drug therapy: Secondary | ICD-10-CM | POA: Insufficient documentation

## 2016-03-07 DIAGNOSIS — Z794 Long term (current) use of insulin: Secondary | ICD-10-CM | POA: Diagnosis not present

## 2016-03-07 DIAGNOSIS — F17210 Nicotine dependence, cigarettes, uncomplicated: Secondary | ICD-10-CM | POA: Insufficient documentation

## 2016-03-07 DIAGNOSIS — R140 Abdominal distension (gaseous): Secondary | ICD-10-CM | POA: Insufficient documentation

## 2016-03-07 DIAGNOSIS — E119 Type 2 diabetes mellitus without complications: Secondary | ICD-10-CM | POA: Diagnosis not present

## 2016-03-07 DIAGNOSIS — I110 Hypertensive heart disease with heart failure: Secondary | ICD-10-CM | POA: Diagnosis not present

## 2016-03-07 DIAGNOSIS — I5033 Acute on chronic diastolic (congestive) heart failure: Secondary | ICD-10-CM | POA: Diagnosis not present

## 2016-03-07 DIAGNOSIS — J449 Chronic obstructive pulmonary disease, unspecified: Secondary | ICD-10-CM | POA: Insufficient documentation

## 2016-03-07 DIAGNOSIS — R109 Unspecified abdominal pain: Secondary | ICD-10-CM

## 2016-03-07 DIAGNOSIS — R319 Hematuria, unspecified: Secondary | ICD-10-CM | POA: Diagnosis not present

## 2016-03-07 LAB — URINALYSIS, COMPLETE (UACMP) WITH MICROSCOPIC
BACTERIA UA: NONE SEEN
Bilirubin Urine: NEGATIVE
Glucose, UA: 50 mg/dL — AB
KETONES UR: NEGATIVE mg/dL
Leukocytes, UA: NEGATIVE
Nitrite: NEGATIVE
PROTEIN: 100 mg/dL — AB
Specific Gravity, Urine: 1.009 (ref 1.005–1.030)
pH: 6 (ref 5.0–8.0)

## 2016-03-07 LAB — COMPREHENSIVE METABOLIC PANEL
ALBUMIN: 2.5 g/dL — AB (ref 3.5–5.0)
ALT: 18 U/L (ref 14–54)
ANION GAP: 6 (ref 5–15)
AST: 31 U/L (ref 15–41)
Alkaline Phosphatase: 223 U/L — ABNORMAL HIGH (ref 38–126)
BILIRUBIN TOTAL: 0.3 mg/dL (ref 0.3–1.2)
BUN: 14 mg/dL (ref 6–20)
CO2: 32 mmol/L (ref 22–32)
Calcium: 8.1 mg/dL — ABNORMAL LOW (ref 8.9–10.3)
Chloride: 99 mmol/L — ABNORMAL LOW (ref 101–111)
Creatinine, Ser: 0.77 mg/dL (ref 0.44–1.00)
GFR calc Af Amer: >60 mL/min (ref >60)
GFR calc non Af Amer: >60 mL/min (ref >60)
GLUCOSE: 239 mg/dL — AB (ref 65–99)
POTASSIUM: 3.3 mmol/L — AB (ref 3.5–5.1)
SODIUM: 137 mmol/L (ref 135–145)
TOTAL PROTEIN: 5.8 g/dL — AB (ref 6.5–8.1)

## 2016-03-07 LAB — CBC
HEMATOCRIT: 35.9 % (ref 35.0–47.0)
HEMOGLOBIN: 12.2 g/dL (ref 12.0–16.0)
MCH: 31.3 pg (ref 26.0–34.0)
MCHC: 33.9 g/dL (ref 32.0–36.0)
MCV: 92.3 fL (ref 80.0–100.0)
Platelets: 119 10*3/uL — ABNORMAL LOW (ref 150–440)
RBC: 3.89 MIL/uL (ref 3.80–5.20)
RDW: 15.1 % — ABNORMAL HIGH (ref 11.5–14.5)
WBC: 4.3 10*3/uL (ref 3.6–11.0)

## 2016-03-07 LAB — LIPASE, BLOOD: Lipase: 34 U/L (ref 11–51)

## 2016-03-07 MED ORDER — SPIRONOLACTONE 50 MG PO TABS
50.0000 mg | ORAL_TABLET | Freq: Every day | ORAL | 11 refills | Status: AC
Start: 2016-03-07 — End: 2017-03-07

## 2016-03-07 MED ORDER — HYDROMORPHONE HCL 2 MG PO TABS
2.0000 mg | ORAL_TABLET | Freq: Once | ORAL | Status: AC
  Administered 2016-03-07: 2 mg via ORAL
  Filled 2016-03-07: qty 1

## 2016-03-07 NOTE — ED Triage Notes (Addendum)
Pt states she has had bright red blood with urination today, pressure in pelvis. Pt also c/o hemorrhoids "bothering me".  Pt alert and oriented X4, active, cooperative, pt in NAD. RR even and unlabored, color WNL.

## 2016-03-07 NOTE — ED Provider Notes (Signed)
[facility] Emergency Department Provider Note        Time seen: ----------------------------------------- 9:26 PM on 03/07/2016 -----------------------------------------    I have reviewed the triage vital signs and the nursing notes.   HISTORY  Chief Complaint Hematuria and Pelvic Pain    HPI Samantha Mcmillan is a 61 y.o. female who presents to ER for possible hematuria today. She's also had pressure in her pelvis. She complains of abdominal pain in distention since she was hospitalized last year. She reports a history of cirrhosis, that her diuretics have not significantly improved her edema. She denies fevers, chills or other complaints.   Past Medical History:  Diagnosis Date  . Acute on chronic diastolic CHF (congestive heart failure) ([place]) 05/16/2014  . Anemia   . Anxiety   . Arthritis    "some in my feet" (05/04/2014)  . Asthma   . Chronic bronchitis ([place])    "get it pretty much q yr"   . Chronic lower back pain   . Chronic respiratory failure ([place])   . Cirrhosis of liver ([place])   . Cirrhosis of liver ([place])   . Colon polyp   . COPD (chronic obstructive pulmonary disease) ([place])   . Depression   . Family history of adverse reaction to anesthesia    "my sister doesn't wake up good when she's put deep to sleep"  . GERD (gastroesophageal reflux disease)   . Headache    "more than a couple times/wk" (05/04/2014)  . Heart murmur   . High cholesterol   . History of blood transfusion    "related to my anemia"  . Hypertension   . IBS (irritable bowel syndrome)   . IDA (iron deficiency anemia) 08/14/2014  . Iron deficiency anemia   . OCD (obsessive compulsive disorder)   . On home oxygen therapy    "2L; 24/7" (05/04/2014)  . Panic attack   . Personal history of tobacco use, presenting hazards to health 06/21/2015  . Pneumonia "several times"  . PTSD (post-traumatic stress disorder)   . Sleep apnea    "can't tolerate CPAP" (05/04/2014)  .  Type II diabetes mellitus [facility])     Patient Active Problem List   Diagnosis Date Noted  . Hypokalemia 12/27/2015  . Acute on chronic diastolic heart failure ([place]) 12/27/2015  . Abdominal pain 11/13/2015  . Chronic pain 11/13/2015  . Closed nondisplaced fracture of seventh cervical vertebra with routine healing 11/13/2015  . Essential hypertension 11/13/2015  . Gallstone pancreatitis 11/13/2015  . Jaundice 11/13/2015  . Closed nondisplaced fracture of seventh cervical vertebra ([place]) 11/01/2015  . ARF (acute renal failure) ([place]) 07/06/2015  . Hyperkalemia 07/06/2015  . Dehydration 07/06/2015  . Personal history of tobacco use, presenting hazards to health 06/21/2015  . Bilateral carotid artery stenosis 06/04/2015  . Mixed hyperlipidemia 06/04/2015  . Fall 04/16/2015  . Bacterial skin infection of leg 10/24/2014  . Edema leg 10/08/2014  . Hypertensive pulmonary vascular disease 10/08/2014  . Chronic diastolic heart failure ([place]) 09/21/2014  . Tobacco use 09/21/2014  . Thrombocytopenia ([place]) 09/19/2014  . Leukopenia 09/19/2014  . Stasis dermatitis of left lower extremity with venous ulcer due to chronic peripheral venous hypertension ([place]) 08/15/2014  . IDA (iron deficiency anemia) 08/14/2014  . Ascites 07/25/2014  . Cellulitis and abscess of lower extremity 07/07/2014  . Encephalopathy, hepatic ([place]) 05/18/2014  . Polypharmacy 05/18/2014  . Failure to thrive in adult 05/16/2014  . Hepatic cirrhosis ([place]) 05/16/2014  . Protein-calorie malnutrition, severe ([place])  05/16/2014  . Advanced COPD ([place]) 05/16/2014  . Acute on chronic diastolic CHF (congestive heart failure) ([place]) 05/16/2014  . Chronic back pain 05/16/2014  . Weakness 05/15/2014  . Hyponatremia 05/15/2014  . HCAP (healthcare-associated pneumonia) 05/09/2014  . Urinary retention 05/09/2014  . DM2 (diabetes mellitus, type 2) ([place]) 05/09/2014  . Hypothyroidism 05/05/2014    Past Surgical History:  Procedure Laterality  Date  . BACK SURGERY    . CATARACT EXTRACTION W/ INTRAOCULAR LENS  IMPLANT, BILATERAL Bilateral 2005  . CESAREAN SECTION  1979  . COLONOSCOPY  2014  . DILATION AND CURETTAGE OF UTERUS    . INCISION AND DRAINAGE OF WOUND Right 2009   "leg mauled  by dog"  . PERIPHERAL VASCULAR CATHETERIZATION N/A 08/16/2014   Procedure: PICC Line Insertion;  Surgeon: Algernon Huxley, MD;  Location: [facility];  Service: Cardiovascular;  Laterality: N/A;  . POSTERIOR FUSION CERVICAL SPINE  2015   "rebuilt 3 of my neck vertebrae"  . TUBAL LIGATION  1979  . UPPER GASTROINTESTINAL ENDOSCOPY      Allergies Fish allergy; Iodine; Latex; Other; Oxycodone-acetaminophen; Percocet [oxycodone-acetaminophen]; Shellfish allergy; Shellfish-derived products; Amitriptyline; Fish-derived products; Tape; Wellbutrin [bupropion]; Augmentin [amoxicillin-pot clavulanate]; Codeine; Cortisone; Diphenhydramine; and Vicodin [hydrocodone-acetaminophen]  Social History Social History  Substance Use Topics  . Smoking status: Current Every Day Smoker    Packs/day: 0.50    Years: 48.00    Types: Cigarettes  . Smokeless tobacco: Never Used  . Alcohol use No    Review of Systems Constitutional: Negative for fever. Cardiovascular: Negative for chest pain. Respiratory: Negative for shortness of breath. Gastrointestinal: Negative for abdominal pain, Positive for distention Genitourinary: Positive for hematuria Musculoskeletal: Negative for back pain. Skin: Negative for rash. Neurological: Negative for headaches, focal weakness or numbness.  10-point ROS otherwise negative.  ____________________________________________   PHYSICAL EXAM:  VITAL SIGNS: ED Triage Vitals [03/07/16 1830]  Enc Vitals Group     BP (!) 186/62     Pulse Rate 82     Resp 18     Temp 98.3 F (36.8 C)     Temp Source Oral     SpO2 100 %     Weight 203 lb (92.1 kg)     Height      Head Circumference      Peak Flow      Pain Score 6      Pain Loc      Pain Edu?      Excl. in [place]?     Constitutional: Alert and oriented. Well appearing and in no distress. Eyes: Conjunctivae are normal. PERRL. Normal extraocular movements. ENT   Head: Normocephalic and atraumatic.   Nose: No congestion/rhinnorhea.   Mouth/Throat: Mucous membranes are moist.   Neck: No stridor. Cardiovascular: Normal rate, regular rhythm. No murmurs, rubs, or gallops. Respiratory: Normal respiratory effort without tachypnea nor retractions. Breath sounds are clear and equal bilaterally. No wheezes/rales/rhonchi. Gastrointestinal: Distended, nontender, normal bowel sounds. Ascites is present Musculoskeletal: Nontender with normal range of motion in all extremities. Pitting peripheral edema is noted Neurologic:  Normal speech and language. No gross focal neurologic deficits are appreciated.  Skin:  Skin is warm, dry and intact. No rash noted. Psychiatric: Mood and affect are normal. Speech and behavior are normal.  ____________________________________________  ED COURSE:  Pertinent labs & imaging results that were available during my care of the patient were reviewed by me and considered in my medical decision making (see chart for details). Clinical Course  Patient presents to the ER in no distress, we will assess with labs and imaging.  Procedures ____________________________________________   LABS (pertinent positives/negatives)  Labs Reviewed  URINALYSIS, COMPLETE (UACMP) WITH MICROSCOPIC - Abnormal; Notable for the following:       Result Value   Color, Urine YELLOW (*)    APPearance CLEAR (*)    Glucose, UA 50 (*)    Hgb urine dipstick MODERATE (*)    Protein, ur 100 (*)    Squamous Epithelial / LPF 0-5 (*)    All other components within normal limits  COMPREHENSIVE METABOLIC PANEL - Abnormal; Notable for the following:    Potassium 3.3 (*)    Chloride 99 (*)    Glucose, Bld 239 (*)    Calcium 8.1 (*)    Total Protein 5.8  (*)    Albumin 2.5 (*)    Alkaline Phosphatase 223 (*)    All other components within normal limits  CBC - Abnormal; Notable for the following:    RDW 15.1 (*)    Platelets 119 (*)    All other components within normal limits  LIPASE, BLOOD    RADIOLOGY Images were viewed by me  CT renal protocol IMPRESSION: 1. Cirrhotic changes in the liver. Associated splenomegaly suggests portal venous hypertension. 2. Moderate volume ascites. 3. Cholelithiasis. 4. 2 mm nonobstructing stone interpolar right kidney. 5. Abdominal Aortic Atherosclerois (ICD10-170.0) 6. Diffuse body wall edema. 7. Compression fractures at T10 and L5 are age-indeterminate. ____________________________________________  FINAL ASSESSMENT AND PLAN  Abdominal pain, hematuria  Plan: Patient with labs and imaging as dictated above. Patient is in no distress, no clear etiology for her symptoms. I will increase her diuretic dosage and encourage close outpatient follow-up.   Earleen Newport, MD   Note: This dictation was prepared with Dragon dictation. Any transcriptional errors that result from this process are unintentional    Earleen Newport, MD 03/07/16 2212

## 2016-03-07 NOTE — ED Notes (Signed)
Pt. Going home with sister. 

## 2016-05-21 ENCOUNTER — Telehealth: Payer: Self-pay | Admitting: *Deleted

## 2016-05-21 ENCOUNTER — Encounter: Payer: Self-pay | Admitting: General Surgery

## 2016-05-21 ENCOUNTER — Ambulatory Visit (INDEPENDENT_AMBULATORY_CARE_PROVIDER_SITE_OTHER): Payer: Medicare Other | Admitting: General Surgery

## 2016-05-21 VITALS — BP 128/70 | HR 68 | Resp 16 | Ht 63.0 in | Wt 182.0 lb

## 2016-05-21 DIAGNOSIS — K644 Residual hemorrhoidal skin tags: Secondary | ICD-10-CM | POA: Diagnosis not present

## 2016-05-21 MED ORDER — HYDROCORTISONE ACE-PRAMOXINE 2.5-1 % RE CREA: 1.0000 "application " | TOPICAL_CREAM | Freq: Two times a day (BID) | RECTAL | 0 refills | Status: DC

## 2016-05-21 NOTE — Telephone Encounter (Signed)
Unable to reach patient to let her know that I have sent necessary information to [employer] for authorization for hydrocortisone ace pramoxoine cream(analpram cream). I left a message with son so we could try to get them a sample if possible but no answer.

## 2016-05-21 NOTE — Patient Instructions (Signed)
Return in two to three weeks.  

## 2016-05-21 NOTE — Progress Notes (Signed)
Patient ID: Samantha Mcmillan, female   DOB: 11-Feb-1956, 61 y.o.   MRN: [id number]  Chief Complaint  Patient presents with  . Other    HPI Samantha Mcmillan is a 61 y.o. female here today for a evaluation of bleeding hemorrhoids. The blood in on the paper and in the bowl. Pain with moving her bowels.  I have reviewed the history of present illness with the patient.  HPI  Past Medical History:  Diagnosis Date  . Acute on chronic diastolic CHF (congestive heart failure) ([place]) 05/16/2014  . Anemia   . Anxiety   . Arthritis    "some in my feet" (05/04/2014)  . Asthma   . Chronic bronchitis ([place])    "get it pretty much q yr"   . Chronic lower back pain   . Chronic respiratory failure ([place])   . Cirrhosis of liver ([place])   . Cirrhosis of liver ([place])   . Colon polyp   . COPD (chronic obstructive pulmonary disease) ([place])   . Depression   . Family history of adverse reaction to anesthesia    "my sister doesn't wake up good when she's put deep to sleep"  . GERD (gastroesophageal reflux disease)   . Headache    "more than a couple times/wk" (05/04/2014)  . Heart murmur   . High cholesterol   . History of blood transfusion    "related to my anemia"  . Hypertension   . IBS (irritable bowel syndrome)   . IDA (iron deficiency anemia) 08/14/2014  . Iron deficiency anemia   . OCD (obsessive compulsive disorder)   . On home oxygen therapy    "2L; 24/7" (05/04/2014)  . Panic attack   . Personal history of tobacco use, presenting hazards to health 06/21/2015  . Pneumonia "several times"  . PTSD (post-traumatic stress disorder)   . Sleep apnea    "can't tolerate CPAP" (05/04/2014)  . Type II diabetes mellitus ([place])     Past Surgical History:  Procedure Laterality Date  . BACK SURGERY    . CATARACT EXTRACTION W/ INTRAOCULAR LENS  IMPLANT, BILATERAL Bilateral 2005  . CESAREAN SECTION  1979  . COLONOSCOPY  2014  . DILATION AND CURETTAGE OF UTERUS    . INCISION AND DRAINAGE OF WOUND Right  2009   "leg mauled  by dog"  . PERIPHERAL VASCULAR CATHETERIZATION N/A 08/16/2014   Procedure: PICC Line Insertion;  Surgeon: Algernon Huxley, MD;  Location: [facility];  Service: Cardiovascular;  Laterality: N/A;  . POSTERIOR FUSION CERVICAL SPINE  2015   "rebuilt 3 of my neck vertebrae"  . TUBAL LIGATION  1979  . UPPER GASTROINTESTINAL ENDOSCOPY      Family History  Problem Relation Age of Onset  . Cancer Father     pancreatic  . Cancer Brother   . Breast cancer Maternal Aunt     Social History Social History  Substance Use Topics  . Smoking status: Current Every Day Smoker    Packs/day: 0.50    Years: 48.00    Types: Cigarettes  . Smokeless tobacco: Never Used  . Alcohol use No    Allergies  Allergen Reactions  . Fish Allergy Anaphylaxis and Swelling  . Iodine Shortness Of Breath  . Latex Anaphylaxis  . Other Rash, Other (See Comments), Shortness Of Breath, Anaphylaxis and Swelling    Pt states that she is allergic to Darvocet.   . Oxycodone-Acetaminophen Other (See Comments), Rash, Swelling and Shortness Of Breath  wheezing wheezing  . Percocet [Oxycodone-Acetaminophen] Shortness Of Breath  . Shellfish Allergy Anaphylaxis and Swelling  . Shellfish-Derived Products Anaphylaxis, Rash, Shortness Of Breath and Swelling    Throat closes.  . Amitriptyline Other (See Comments)    Other reaction(s): Other (See Comments) Altered mental status, agitation Reaction:  Mental changes   . Fish-Derived Products Other (See Comments) and Swelling    "respiratory distress and wheezing"  . Tape Other (See Comments)    Pt states that it pulls her skin off.  Pt states that it pulls her skin off.   . Wellbutrin [Bupropion] Other (See Comments)    Reaction:  Mental changes   . Augmentin [Amoxicillin-Pot Clavulanate] Rash and Other (See Comments)    Has patient had a PCN reaction causing immediate rash, facial/tongue/throat swelling, SOB or lightheadedness with hypotension:  No Has patient had a PCN reaction causing severe rash involving mucus membranes or skin necrosis: No Has patient had a PCN reaction that required hospitalization No Has patient had a PCN reaction occurring within the last 10 years: No If all of the above answers are "NO", then may proceed with Cephalosporin use.  . Codeine Rash  . Cortisone Rash  . Diphenhydramine Rash  . Vicodin [Hydrocodone-Acetaminophen] Rash and Other (See Comments)    Reaction:  Dizziness      Current Outpatient Prescriptions  Medication Sig Dispense Refill  . albuterol (PROVENTIL HFA;VENTOLIN HFA) 108 (90 BASE) MCG/ACT inhaler Inhale 2 puffs into the lungs every 4 (four) hours as needed for wheezing or shortness of breath.    Marland Kitchen albuterol (PROVENTIL) (2.5 MG/3ML) 0.083% nebulizer solution Take 2.5 mg by nebulization every 6 (six) hours as needed for wheezing or shortness of breath.    . allopurinol (ZYLOPRIM) 100 MG tablet Take 100 mg by mouth daily.     . ARIPiprazole (ABILIFY) 5 MG tablet Take 5 mg by mouth at bedtime.     Marland Kitchen atorvastatin (LIPITOR) 10 MG tablet Take 10 mg by mouth at bedtime.     . budesonide-formoterol (SYMBICORT) 80-4.5 MCG/ACT inhaler Inhale 2 puffs into the lungs 2 (two) times daily.     . bumetanide (BUMEX) 1 MG tablet Take 1 mg by mouth 2 (two) times daily.    . Buprenorphine (BUTRANS) 15 MCG/HR PTWK Place 20 mcg onto the skin once a week. Pt applies on Sunday. 1 patch 0  . ferrous fumarate-iron polysaccharide complex (TANDEM) 162-115.2 MG CAPS capsule Take 1 capsule by mouth 2 (two) times daily after a meal. 60 capsule 11  . fluticasone (FLONASE) 50 MCG/ACT nasal spray Place 1-2 sprays into both nostrils daily as needed for rhinitis.    Marland Kitchen HYDROmorphone (DILAUDID) 2 MG tablet Take 1 tablet (2 mg total) by mouth every 6 (six) hours as needed for severe pain. (Patient taking differently: Take 2 mg by mouth every 12 (twelve) hours as needed for severe pain. ) 30 tablet 0  . insulin glargine (LANTUS)  100 UNIT/ML injection Inject into the skin at bedtime.    Marland Kitchen lactulose (CHRONULAC) 10 GM/15ML solution Take 20 g by mouth 2 (two) times daily.     Marland Kitchen lamoTRIgine (LAMICTAL) 150 MG tablet Take 150 mg by mouth at bedtime.     Marland Kitchen levothyroxine (SYNTHROID, LEVOTHROID) 88 MCG tablet Take 88 mcg by mouth daily before breakfast.     . metoCLOPramide (REGLAN) 10 MG tablet Take 10 mg by mouth 4 (four) times daily as needed for nausea or vomiting.     . metoprolol (LOPRESSOR)  50 MG tablet Take 50 mg by mouth 2 (two) times daily.    . Multiple Vitamin (MULTIVITAMIN WITH MINERALS) TABS tablet Take 1 tablet by mouth daily.    . pantoprazole (PROTONIX) 40 MG tablet Take 40 mg by mouth daily.    Marland Kitchen senna-docusate (SENOKOT-S) 8.6-50 MG tablet Take 1 tablet by mouth 2 (two) times daily.    . sertraline (ZOLOFT) 100 MG tablet Take 100 mg by mouth 2 (two) times daily.     Marland Kitchen spironolactone (ALDACTONE) 50 MG tablet Take 1 tablet (50 mg total) by mouth daily. 30 tablet 11  . hydrocortisone-pramoxine (ANALPRAM HC) 2.5-1 % rectal cream Place 1 application rectally 2 (two) times daily. 30 g 0   No current facility-administered medications for this visit.     Review of Systems Review of Systems  Constitutional: Negative.   Respiratory: Negative.   Cardiovascular: Negative.     Blood pressure 128/70, pulse 68, resp. rate 16, height 5\' 3"  (1.6 m), weight 82.6 kg (182 lb).  Physical Exam Physical Exam  Constitutional: She is oriented to person, place, and time. She appears well-developed and well-nourished.  Eyes: Conjunctivae are normal. No scleral icterus.  Neck: Neck supple.  Cardiovascular: Normal rate and regular rhythm.   Abdominal: She exhibits distension and ascites. There is generalized tenderness (mild generalised).  Genitourinary: Rectal exam shows external hemorrhoid and internal hemorrhoid.     Lymphadenopathy:    She has no cervical adenopathy.  Neurological: She is alert and oriented to person,  place, and time.  Skin: Skin is warm and dry.    Data Reviewed  prior note- pt seen  Before with similar complaints and findings Assessment    Multiple internal/external hemorrhoids. No active bleeding noted. She has cirrhosis and ascites. She is not a candidate for surgery- she likely requires stapled hemorrhoidectomy. With suspected portal hypertensin bleeding can be a major issue. Pt advised.    Plan    Trial of Analpram cream. She is currently using nupercainol   Recheck in 2-3 weeks  This information has been scribed by Gaspar Cola CMA.   Charonda Hefter G 05/25/2016, 8:56 AM

## 2016-05-26 NOTE — Telephone Encounter (Signed)
She will come by the office one day this week for a sample of the analpram cream. Insurance did not cover medications.

## 2016-06-10 ENCOUNTER — Ambulatory Visit (INDEPENDENT_AMBULATORY_CARE_PROVIDER_SITE_OTHER): Payer: 59 | Admitting: General Surgery

## 2016-06-10 ENCOUNTER — Encounter: Payer: Self-pay | Admitting: General Surgery

## 2016-06-10 VITALS — BP 128/58 | HR 68 | Resp 14 | Ht 64.0 in | Wt 172.0 lb

## 2016-06-10 DIAGNOSIS — K648 Other hemorrhoids: Secondary | ICD-10-CM

## 2016-06-10 DIAGNOSIS — K644 Residual hemorrhoidal skin tags: Secondary | ICD-10-CM | POA: Diagnosis not present

## 2016-06-10 DIAGNOSIS — R188 Other ascites: Secondary | ICD-10-CM

## 2016-06-10 DIAGNOSIS — K7460 Unspecified cirrhosis of liver: Secondary | ICD-10-CM | POA: Diagnosis not present

## 2016-06-10 NOTE — Progress Notes (Signed)
Patient ID: Samantha Mcmillan, female   DOB: 1956/02/12, 61 y.o.   MRN: [id number]  Chief Complaint  Patient presents with  . Follow-up    HPI Samantha Mcmillan is a 61 y.o. female.  Here today for follow up hemorrhoids. She states the bleeding has decreased. She thinks she is allergic to the cream because she has a rash. She states she is nauseated which is not new.   HPI  Past Medical History:  Diagnosis Date  . Acute on chronic diastolic CHF (congestive heart failure) ([place]) 05/16/2014  . Anemia   . Anxiety   . Arthritis    "some in my feet" (05/04/2014)  . Asthma   . Chronic bronchitis ([place])    "get it pretty much q yr"   . Chronic lower back pain   . Chronic respiratory failure ([place])   . Cirrhosis of liver ([place])   . Cirrhosis of liver ([place])   . Colon polyp   . COPD (chronic obstructive pulmonary disease) ([place])   . Depression   . Family history of adverse reaction to anesthesia    "my sister doesn't wake up good when she's put deep to sleep"  . GERD (gastroesophageal reflux disease)   . Headache    "more than a couple times/wk" (05/04/2014)  . Heart murmur   . High cholesterol   . History of blood transfusion    "related to my anemia"  . Hypertension   . IBS (irritable bowel syndrome)   . IDA (iron deficiency anemia) 08/14/2014  . Iron deficiency anemia   . OCD (obsessive compulsive disorder)   . On home oxygen therapy    "2L; 24/7" (05/04/2014)  . Panic attack   . Personal history of tobacco use, presenting hazards to health 06/21/2015  . Pneumonia "several times"  . PTSD (post-traumatic stress disorder)   . Sleep apnea    "can't tolerate CPAP" (05/04/2014)  . Type II diabetes mellitus ([place])     Past Surgical History:  Procedure Laterality Date  . BACK SURGERY    . CATARACT EXTRACTION W/ INTRAOCULAR LENS  IMPLANT, BILATERAL Bilateral 2005  . CESAREAN SECTION  1979  . COLONOSCOPY  2014  . DILATION AND CURETTAGE OF UTERUS    . INCISION AND DRAINAGE OF WOUND  Right 2009   "leg mauled  by dog"  . PERIPHERAL VASCULAR CATHETERIZATION N/A 08/16/2014   Procedure: PICC Line Insertion;  Surgeon: Algernon Huxley, MD;  Location: [facility];  Service: Cardiovascular;  Laterality: N/A;  . POSTERIOR FUSION CERVICAL SPINE  2015   "rebuilt 3 of my neck vertebrae"  . TUBAL LIGATION  1979  . UPPER GASTROINTESTINAL ENDOSCOPY      Family History  Problem Relation Age of Onset  . Cancer Father     pancreatic  . Cancer Brother   . Breast cancer Maternal Aunt     Social History Social History  Substance Use Topics  . Smoking status: Current Every Day Smoker    Packs/day: 0.50    Years: 48.00    Types: Cigarettes  . Smokeless tobacco: Never Used  . Alcohol use No    Allergies  Allergen Reactions  . Fish Allergy Anaphylaxis and Swelling  . Iodine Shortness Of Breath  . Latex Anaphylaxis  . Other Rash, Other (See Comments), Shortness Of Breath, Anaphylaxis and Swelling    Pt states that she is allergic to Darvocet.   . Oxycodone-Acetaminophen Other (See Comments), Rash, Swelling and Shortness Of Breath  wheezing wheezing  . Percocet [Oxycodone-Acetaminophen] Shortness Of Breath  . Shellfish Allergy Anaphylaxis and Swelling  . Shellfish-Derived Products Anaphylaxis, Rash, Shortness Of Breath and Swelling    Throat closes.  . Amitriptyline Other (See Comments)    Other reaction(s): Other (See Comments) Altered mental status, agitation Reaction:  Mental changes   . Fish-Derived Products Other (See Comments) and Swelling    "respiratory distress and wheezing"  . Tape Other (See Comments)    Pt states that it pulls her skin off.  Pt states that it pulls her skin off.   . Wellbutrin [Bupropion] Other (See Comments)    Reaction:  Mental changes   . Augmentin [Amoxicillin-Pot Clavulanate] Rash and Other (See Comments)    Has patient had a PCN reaction causing immediate rash, facial/tongue/throat swelling, SOB or lightheadedness with  hypotension: No Has patient had a PCN reaction causing severe rash involving mucus membranes or skin necrosis: No Has patient had a PCN reaction that required hospitalization No Has patient had a PCN reaction occurring within the last 10 years: No If all of the above answers are "NO", then may proceed with Cephalosporin use.  . Codeine Rash  . Cortisone Rash  . Diphenhydramine Rash  . Vicodin [Hydrocodone-Acetaminophen] Rash and Other (See Comments)    Reaction:  Dizziness      Current Outpatient Prescriptions  Medication Sig Dispense Refill  . albuterol (PROVENTIL HFA;VENTOLIN HFA) 108 (90 BASE) MCG/ACT inhaler Inhale 2 puffs into the lungs every 4 (four) hours as needed for wheezing or shortness of breath.    Marland Kitchen albuterol (PROVENTIL) (2.5 MG/3ML) 0.083% nebulizer solution Take 2.5 mg by nebulization every 6 (six) hours as needed for wheezing or shortness of breath.    . allopurinol (ZYLOPRIM) 100 MG tablet Take 100 mg by mouth daily.     . ARIPiprazole (ABILIFY) 5 MG tablet Take 5 mg by mouth at bedtime.     Marland Kitchen atorvastatin (LIPITOR) 10 MG tablet Take 10 mg by mouth at bedtime.     . budesonide-formoterol (SYMBICORT) 80-4.5 MCG/ACT inhaler Inhale 2 puffs into the lungs 2 (two) times daily.     . bumetanide (BUMEX) 1 MG tablet Take 1 mg by mouth 2 (two) times daily.    . Buprenorphine (BUTRANS) 15 MCG/HR PTWK Place 20 mcg onto the skin once a week. Pt applies on Sunday. 1 patch 0  . ferrous fumarate-iron polysaccharide complex (TANDEM) 162-115.2 MG CAPS capsule Take 1 capsule by mouth 2 (two) times daily after a meal. 60 capsule 11  . fluticasone (FLONASE) 50 MCG/ACT nasal spray Place 1-2 sprays into both nostrils daily as needed for rhinitis.    . hydrocortisone-pramoxine (ANALPRAM HC) 2.5-1 % rectal cream Place 1 application rectally 2 (two) times daily. 30 g 0  . HYDROmorphone (DILAUDID) 2 MG tablet Take 1 tablet (2 mg total) by mouth every 6 (six) hours as needed for severe pain. (Patient  taking differently: Take 2 mg by mouth every 12 (twelve) hours as needed for severe pain. ) 30 tablet 0  . insulin glargine (LANTUS) 100 UNIT/ML injection Inject into the skin at bedtime.    Marland Kitchen lactulose (CHRONULAC) 10 GM/15ML solution Take 20 g by mouth 2 (two) times daily.     Marland Kitchen lamoTRIgine (LAMICTAL) 150 MG tablet Take 150 mg by mouth at bedtime.     Marland Kitchen levothyroxine (SYNTHROID, LEVOTHROID) 88 MCG tablet Take 88 mcg by mouth daily before breakfast.     . metoCLOPramide (REGLAN) 10 MG tablet Take 10  mg by mouth 4 (four) times daily as needed for nausea or vomiting.     . metoprolol (LOPRESSOR) 50 MG tablet Take 50 mg by mouth 2 (two) times daily.    . Multiple Vitamin (MULTIVITAMIN WITH MINERALS) TABS tablet Take 1 tablet by mouth daily.    . pantoprazole (PROTONIX) 40 MG tablet Take 40 mg by mouth daily.    Marland Kitchen senna-docusate (SENOKOT-S) 8.6-50 MG tablet Take 1 tablet by mouth 2 (two) times daily.    . sertraline (ZOLOFT) 100 MG tablet Take 100 mg by mouth 2 (two) times daily.     Marland Kitchen spironolactone (ALDACTONE) 50 MG tablet Take 1 tablet (50 mg total) by mouth daily. 30 tablet 11   No current facility-administered medications for this visit.     Review of Systems Review of Systems  Constitutional: Negative.   Respiratory: Negative.   Cardiovascular: Negative.   Gastrointestinal: Positive for nausea.    Blood pressure (!) 128/58, pulse 68, resp. rate 14, height 5\' 4"  (1.626 m), weight 172 lb (78 kg).  Physical Exam Physical Exam  Constitutional: She is oriented to person, place, and time. She appears well-developed and well-nourished.  Abdominal: She exhibits distension and ascites.  Genitourinary: Rectal exam shows external hemorrhoid and internal hemorrhoid.     Neurological: She is alert and oriented to person, place, and time.  Skin: Skin is warm and dry.  Psychiatric: Her behavior is normal.    Data Reviewed Prior notes reviewed  Assessment    Multiple internal/external  hemorrhoids. None actively bleeding. 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 Due to cirrhosis and ascites, suspected portal hypertension, she is not a candidate for surgery. Discussed this with her and advised her to use a nonsteroid OTC cream for the hemorrhoids.   Excoriated skin near gluteal cleft - patient feels this was caused by a steroid cream she reacted to. Advised her to use neosporin on it.     Plan    Supportive care, OTC topical creams for comfort. Encouraged patient to call with questions or concerns.      HPI, Physical Exam, Assessment and Plan have been scribed under the direction and in the presence of Mckinley Jewel, MD  Karie Fetch, RN   I have completed the exam and reviewed the above documentation for accuracy and completeness.  I agree with the above.  Haematologist has been used and any errors in dictation or transcription are unintentional.  Seeplaputhur G. Jamal Collin, M.D., F.A.C.S.  Junie Panning G 06/10/2016, 4:25 PM

## 2016-06-10 NOTE — Patient Instructions (Addendum)
   Supportive care, OTC topical creams for comfort. Encouraged patient to call with questions or concerns.

## 2016-08-10 ENCOUNTER — Encounter: Payer: Self-pay | Admitting: *Deleted

## 2016-08-10 ENCOUNTER — Ambulatory Visit: Payer: Medicare Other | Admitting: Gastroenterology

## 2016-08-10 ENCOUNTER — Encounter: Payer: Self-pay | Admitting: Gastroenterology

## 2016-08-10 ENCOUNTER — Other Ambulatory Visit: Payer: Self-pay

## 2016-08-10 DIAGNOSIS — I878 Other specified disorders of veins: Secondary | ICD-10-CM | POA: Insufficient documentation

## 2016-11-30 ENCOUNTER — Other Ambulatory Visit
Admission: RE | Admit: 2016-11-30 | Discharge: 2016-11-30 | Disposition: A | Discharge status: Home / Self Care | Payer: Medicare Other | Source: Ambulatory Visit | Attending: Gastroenterology | Admitting: Gastroenterology

## 2016-11-30 ENCOUNTER — Other Ambulatory Visit: Payer: Self-pay

## 2016-11-30 ENCOUNTER — Ambulatory Visit (INDEPENDENT_AMBULATORY_CARE_PROVIDER_SITE_OTHER): Payer: Medicare Other | Admitting: Gastroenterology

## 2016-11-30 ENCOUNTER — Encounter: Payer: Self-pay | Admitting: Gastroenterology

## 2016-11-30 VITALS — BP 108/60 | HR 57 | Temp 97.8°F | Ht 64.0 in | Wt 160.0 lb

## 2016-11-30 DIAGNOSIS — D500 Iron deficiency anemia secondary to blood loss (chronic): Secondary | ICD-10-CM | POA: Insufficient documentation

## 2016-11-30 DIAGNOSIS — K7460 Unspecified cirrhosis of liver: Secondary | ICD-10-CM | POA: Diagnosis present

## 2016-11-30 DIAGNOSIS — R188 Other ascites: Principal | ICD-10-CM

## 2016-11-30 LAB — CBC WITH DIFFERENTIAL/PLATELET
BASOS PCT: 1 %
Basophils Absolute: 0.1 10*3/uL (ref 0–0.1)
EOS PCT: 1 %
Eosinophils Absolute: 0.1 10*3/uL (ref 0–0.7)
HEMATOCRIT: 35 % (ref 35.0–47.0)
Hemoglobin: 12.2 g/dL (ref 12.0–16.0)
Lymphocytes Relative: 19 %
Lymphs Abs: 1.4 10*3/uL (ref 1.0–3.6)
MCH: 33.8 pg (ref 26.0–34.0)
MCHC: 34.8 g/dL (ref 32.0–36.0)
MCV: 97.1 fL (ref 80.0–100.0)
MONO ABS: 0.4 10*3/uL (ref 0.2–0.9)
MONOS PCT: 6 %
NEUTROS ABS: 5.2 10*3/uL (ref 1.4–6.5)
Neutrophils Relative %: 73 %
PLATELETS: 141 10*3/uL — AB (ref 150–440)
RBC: 3.61 MIL/uL — ABNORMAL LOW (ref 3.80–5.20)
RDW: 14.8 % — AB (ref 11.5–14.5)
WBC: 7.1 10*3/uL (ref 3.6–11.0)

## 2016-11-30 LAB — HEPATIC FUNCTION PANEL
ALBUMIN: 3.4 g/dL — AB (ref 3.5–5.0)
ALT: 24 U/L (ref 14–54)
AST: 41 U/L (ref 15–41)
Alkaline Phosphatase: 189 U/L — ABNORMAL HIGH (ref 38–126)
BILIRUBIN DIRECT: 0.2 mg/dL (ref 0.1–0.5)
BILIRUBIN TOTAL: 0.5 mg/dL (ref 0.3–1.2)
Indirect Bilirubin: 0.3 mg/dL (ref 0.3–0.9)
Total Protein: 7.3 g/dL (ref 6.5–8.1)

## 2016-11-30 LAB — FERRITIN: FERRITIN: 55 ng/mL (ref 11–307)

## 2016-11-30 NOTE — Progress Notes (Signed)
Primary Care Physician: Casilda Carls, MD  Primary Gastroenterologist:  Dr. Lucilla Lame  Chief Complaint  Patient presents with  . Follow up Cirrhosis    HPI: Samantha Mcmillan is a 61 y.o. female here for follow-up of her cirrhosis.  The patient also reports that she has not had a colonoscopy at some time and suffers from chronic constipation. The patient reports her constipation is helped with lactulose but not completely resolved.  She also reports that she has bloating with the constipation.  Current Outpatient Prescriptions  Medication Sig Dispense Refill  . albuterol (PROAIR HFA) 108 (90 Base) MCG/ACT inhaler ProAir HFA 90 mcg/actuation aerosol inhaler    . albuterol (PROVENTIL) (2.5 MG/3ML) 0.083% nebulizer solution Take 2.5 mg by nebulization every 6 (six) hours as needed for wheezing or shortness of breath.    . allopurinol (ZYLOPRIM) 100 MG tablet Take 100 mg by mouth daily.     . ARIPiprazole (ABILIFY) 5 MG tablet Take 5 mg by mouth at bedtime.     Marland Kitchen atorvastatin (LIPITOR) 10 MG tablet Take 10 mg by mouth at bedtime.     . budesonide-formoterol (SYMBICORT) 80-4.5 MCG/ACT inhaler Inhale 2 puffs into the lungs 2 (two) times daily.     Marland Kitchen HYDROmorphone (DILAUDID) 2 MG tablet Take 1 tablet (2 mg total) by mouth every 6 (six) hours as needed for severe pain. (Patient taking differently: Take 2 mg by mouth every 12 (twelve) hours as needed for severe pain. ) 30 tablet 0  . insulin glargine (LANTUS) 100 UNIT/ML injection Inject into the skin at bedtime.    . Ipratropium-Albuterol (COMBIVENT RESPIMAT) 20-100 MCG/ACT AERS respimat Combivent Respimat 20 mcg-100 mcg/actuation solution for inhalation    . lactulose (CHRONULAC) 10 GM/15ML solution Take 20 g by mouth 2 (two) times daily.     Marland Kitchen lamoTRIgine (LAMICTAL) 150 MG tablet Take 150 mg by mouth at bedtime.     Marland Kitchen levothyroxine (SYNTHROID, LEVOTHROID) 88 MCG tablet Take 88 mcg by mouth daily before breakfast.     . metoprolol  (LOPRESSOR) 50 MG tablet Take 50 mg by mouth 2 (two) times daily.    . Multiple Vitamin (MULTIVITAMIN WITH MINERALS) TABS tablet Take 1 tablet by mouth daily.    . pantoprazole (PROTONIX) 40 MG tablet Take 40 mg by mouth daily.    Marland Kitchen senna-docusate (SENOKOT-S) 8.6-50 MG tablet Take 1 tablet by mouth 2 (two) times daily.    . sertraline (ZOLOFT) 100 MG tablet Take 100 mg by mouth 2 (two) times daily.     Marland Kitchen spironolactone (ALDACTONE) 50 MG tablet Take 1 tablet (50 mg total) by mouth daily. 30 tablet 11  . bumetanide (BUMEX) 1 MG tablet Take 1 mg by mouth 2 (two) times daily.    . Buprenorphine (BUTRANS) 15 MCG/HR PTWK Place 20 mcg onto the skin once a week. Pt applies on Sunday. (Patient not taking: Reported on 11/30/2016) 1 patch 0  . clonazePAM (KLONOPIN) 0.5 MG tablet clonazepam 0.5 mg tablet    . cyclobenzaprine (FLEXERIL) 10 MG tablet cyclobenzaprine 10 mg tablet    . ferrous fumarate-iron polysaccharide complex (TANDEM) 162-115.2 MG CAPS capsule Take 1 capsule by mouth 2 (two) times daily after a meal. (Patient not taking: Reported on 11/30/2016) 60 capsule 11  . fluticasone (FLONASE) 50 MCG/ACT nasal spray Place 1-2 sprays into both nostrils daily as needed for rhinitis.    . hydrocortisone-pramoxine (ANALPRAM HC) 2.5-1 % rectal cream Place 1 application rectally 2 (two) times daily. (Patient  not taking: Reported on 11/30/2016) 30 g 0  . metoCLOPramide (REGLAN) 10 MG tablet Take 10 mg by mouth 4 (four) times daily as needed for nausea or vomiting.      No current facility-administered medications for this visit.     Allergies as of 11/30/2016 - Review Complete 11/30/2016  Allergen Reaction Noted  . Iodine Shortness Of Breath 03/15/2014  . Latex Anaphylaxis 03/15/2014  . Other Rash, Other (See Comments), Shortness Of Breath, Anaphylaxis, and Swelling 03/15/2014  . Oxycodone-acetaminophen Other (See Comments), Rash, Swelling, and Shortness Of Breath 03/07/2013  . Shellfish-derived products  Anaphylaxis, Rash, Shortness Of Breath, and Swelling 03/28/2013  . Amitriptyline Other (See Comments) 03/07/2013  . Wellbutrin [bupropion] Other (See Comments) 03/15/2014  . Augmentin [amoxicillin-pot clavulanate] Rash and Other (See Comments) 03/15/2014  . Codeine Rash 03/15/2014  . Cortisone Rash 03/15/2014  . Diphenhydramine Rash 03/15/2014  . Vicodin [hydrocodone-acetaminophen] Rash and Other (See Comments) 03/15/2014    ROS:  General: Negative for anorexia, weight loss, fever, chills, fatigue, weakness. ENT: Negative for hoarseness, difficulty swallowing , nasal congestion. CV: Negative for chest pain, angina, palpitations, dyspnea on exertion, peripheral edema.  Respiratory: Negative for dyspnea at rest, dyspnea on exertion, cough, sputum, wheezing.  GI: See history of present illness. GU:  Negative for dysuria, hematuria, urinary incontinence, urinary frequency, nocturnal urination.  Endo: Negative for unusual weight change.    Physical Examination:   BP 108/60   Pulse (!) 57   Temp 97.8 F (36.6 C) (Oral)   Ht 5\' 4"  (1.626 m)   Wt 160 lb (72.6 kg)   LMP  (LMP Unknown)   BMI 27.46 kg/m   General: Well-nourished, well-developed in no acute distress.  Eyes: No icterus. Conjunctivae pink. Mouth: Oropharyngeal mucosa moist and pink , no lesions erythema or exudate. Lungs: Clear to auscultation bilaterally. Non-labored. Heart: Regular rate and rhythm, no murmurs rubs or gallops.  Abdomen: Bowel sounds are normal, nontender, nondistended, no hepatosplenomegaly or masses, no abdominal bruits or hernia , no rebound or guarding.   Extremities: No lower extremity edema. No clubbing or deformities. Neuro: Alert and oriented x 3.  Grossly intact. Skin: Warm and dry, no jaundice.  Both arms with ecchymosis diffusely. Psych: Alert and cooperative, normal mood and affect.  Labs:    Imaging Studies: No results found.  Assessment and Plan:   Samantha Mcmillan is a 61 y.o. y/o  female with a history of cirrhosis and Her most recent labs showed her alkaline phosphatase to be elevated but her liver enzymes were otherwise normal.  The patient will have her blood sends off for AMA and AMA.  The patient has been diagnosed in the past with nonalcoholic steatohepatitis.  The patient will also be set up for an EGD and colonoscopy for evaluation of possible varices and a colonoscopy for a history of colon polyps. I have discussed risks & benefits which include, but are not limited to, bleeding, infection, perforation & drug reaction.  The patient agrees with this plan & written consent will be obtained.     Lucilla Lame, MD. Marval Regal   Note: This dictation was prepared with Dragon dictation along with smaller phrase technology. Any transcriptional errors that result from this process are unintentional.

## 2016-12-01 ENCOUNTER — Telehealth: Payer: Self-pay | Admitting: Internal Medicine

## 2016-12-01 ENCOUNTER — Other Ambulatory Visit: Payer: Self-pay

## 2016-12-01 DIAGNOSIS — Z86010 Personal history of colonic polyps: Secondary | ICD-10-CM

## 2016-12-01 LAB — AFP TUMOR MARKER: AFP, SERUM, TUMOR MARKER: 42.8 ng/mL — AB (ref 0.0–8.3)

## 2016-12-01 LAB — MITOCHONDRIAL ANTIBODIES: MITOCHONDRIAL M2 AB, IGG: 5.1 U (ref 0.0–20.0)

## 2016-12-01 LAB — ANA W/REFLEX: Anti Nuclear Antibody(ANA): NEGATIVE

## 2016-12-01 NOTE — Telephone Encounter (Signed)
Samantha Mcmillan- please inform pt that her AFP is elevated; and that I would recommend MRI of liver at the earliest; follow up with me few days later.   I have tried myself to order MRI of liver; unable to as somebody else is in the chart.   Samantha Mcmillan- Please order MRI liver with and without contrast.

## 2016-12-02 ENCOUNTER — Telehealth: Payer: Self-pay | Admitting: *Deleted

## 2016-12-02 ENCOUNTER — Telehealth: Payer: Self-pay

## 2016-12-02 ENCOUNTER — Other Ambulatory Visit: Payer: Self-pay | Admitting: *Deleted

## 2016-12-02 DIAGNOSIS — R772 Abnormality of alphafetoprotein: Secondary | ICD-10-CM

## 2016-12-02 NOTE — Telephone Encounter (Signed)
-----   Message from Lucilla Lame, MD sent at 12/01/2016  6:17 PM EDT ----- Make sure this patient has an MRI of the liver with and without contrast.  It appears that hematology was also trying to set the patient up for an MRI.

## 2016-12-02 NOTE — Telephone Encounter (Signed)
Pt's phone call returned- spoke with patient. She is aware of the plan of care and need to order MRI.

## 2016-12-02 NOTE — Telephone Encounter (Signed)
I have notified pt of MRI appt at [facility] on Thursday, Oct 18th @ 11:00am. I have instructed pt to arrive at 10:45am and check into medical mall registration desk. She has also been instructed to be NPO after midnight.   If any additional instructions are needed, please contact pt.

## 2016-12-02 NOTE — Telephone Encounter (Signed)
Patient states she is returning call regarding scheduling some tests. Please return her call

## 2016-12-02 NOTE — Telephone Encounter (Signed)
Mri orders entered - pending md cosign.  I left msg for pt to return my phone call.   Colette, please arrange for MRI of liver.

## 2016-12-02 NOTE — Telephone Encounter (Signed)
MRI has been order through Dr. Aletha Halim office. A vm was left for pt to return their call to schedule the imaging.

## 2016-12-02 NOTE — Telephone Encounter (Signed)
Tried contacting pt to schedule MRI. No vm on home number and cell not working.

## 2016-12-08 ENCOUNTER — Ambulatory Visit
Admission: RE | Admit: 2016-12-08 | Discharge: 2016-12-08 | Disposition: A | Discharge status: Home / Self Care | Payer: Medicare Other | Source: Ambulatory Visit | Attending: Gastroenterology | Admitting: Gastroenterology

## 2016-12-08 ENCOUNTER — Encounter
Admission: RE | Disposition: A | Discharge status: Home / Self Care | Payer: Self-pay | Source: Ambulatory Visit | Attending: Gastroenterology

## 2016-12-08 ENCOUNTER — Ambulatory Visit: Payer: Medicare Other | Admitting: Anesthesiology

## 2016-12-08 ENCOUNTER — Encounter: Payer: Self-pay | Admitting: Anesthesiology

## 2016-12-08 DIAGNOSIS — Z9981 Dependence on supplemental oxygen: Secondary | ICD-10-CM | POA: Insufficient documentation

## 2016-12-08 DIAGNOSIS — K219 Gastro-esophageal reflux disease without esophagitis: Secondary | ICD-10-CM | POA: Diagnosis not present

## 2016-12-08 DIAGNOSIS — G4730 Sleep apnea, unspecified: Secondary | ICD-10-CM | POA: Insufficient documentation

## 2016-12-08 DIAGNOSIS — Z794 Long term (current) use of insulin: Secondary | ICD-10-CM | POA: Insufficient documentation

## 2016-12-08 DIAGNOSIS — F429 Obsessive-compulsive disorder, unspecified: Secondary | ICD-10-CM | POA: Diagnosis not present

## 2016-12-08 DIAGNOSIS — Z79899 Other long term (current) drug therapy: Secondary | ICD-10-CM | POA: Insufficient documentation

## 2016-12-08 DIAGNOSIS — K5730 Diverticulosis of large intestine without perforation or abscess without bleeding: Secondary | ICD-10-CM | POA: Diagnosis not present

## 2016-12-08 DIAGNOSIS — Z860100 Personal history of colon polyps, unspecified: Secondary | ICD-10-CM

## 2016-12-08 DIAGNOSIS — F17210 Nicotine dependence, cigarettes, uncomplicated: Secondary | ICD-10-CM | POA: Diagnosis not present

## 2016-12-08 DIAGNOSIS — E119 Type 2 diabetes mellitus without complications: Secondary | ICD-10-CM | POA: Diagnosis not present

## 2016-12-08 DIAGNOSIS — E7800 Pure hypercholesterolemia, unspecified: Secondary | ICD-10-CM | POA: Insufficient documentation

## 2016-12-08 DIAGNOSIS — K7460 Unspecified cirrhosis of liver: Secondary | ICD-10-CM | POA: Diagnosis not present

## 2016-12-08 DIAGNOSIS — I110 Hypertensive heart disease with heart failure: Secondary | ICD-10-CM | POA: Insufficient documentation

## 2016-12-08 DIAGNOSIS — I5032 Chronic diastolic (congestive) heart failure: Secondary | ICD-10-CM | POA: Diagnosis not present

## 2016-12-08 DIAGNOSIS — Z7951 Long term (current) use of inhaled steroids: Secondary | ICD-10-CM | POA: Diagnosis not present

## 2016-12-08 DIAGNOSIS — F418 Other specified anxiety disorders: Secondary | ICD-10-CM | POA: Insufficient documentation

## 2016-12-08 DIAGNOSIS — K2970 Gastritis, unspecified, without bleeding: Secondary | ICD-10-CM

## 2016-12-08 DIAGNOSIS — J449 Chronic obstructive pulmonary disease, unspecified: Secondary | ICD-10-CM | POA: Insufficient documentation

## 2016-12-08 DIAGNOSIS — Z1211 Encounter for screening for malignant neoplasm of colon: Secondary | ICD-10-CM | POA: Diagnosis not present

## 2016-12-08 DIAGNOSIS — D124 Benign neoplasm of descending colon: Secondary | ICD-10-CM

## 2016-12-08 DIAGNOSIS — K640 First degree hemorrhoids: Secondary | ICD-10-CM | POA: Diagnosis not present

## 2016-12-08 DIAGNOSIS — D123 Benign neoplasm of transverse colon: Secondary | ICD-10-CM

## 2016-12-08 DIAGNOSIS — F4310 Post-traumatic stress disorder, unspecified: Secondary | ICD-10-CM | POA: Diagnosis not present

## 2016-12-08 DIAGNOSIS — M1992 Post-traumatic osteoarthritis, unspecified site: Secondary | ICD-10-CM | POA: Diagnosis not present

## 2016-12-08 DIAGNOSIS — I8510 Secondary esophageal varices without bleeding: Secondary | ICD-10-CM | POA: Insufficient documentation

## 2016-12-08 HISTORY — PX: COLONOSCOPY WITH PROPOFOL: SHX5780

## 2016-12-08 HISTORY — PX: ESOPHAGOGASTRODUODENOSCOPY (EGD) WITH PROPOFOL: SHX5813

## 2016-12-08 LAB — GLUCOSE, CAPILLARY: Glucose-Capillary: 126 mg/dL — ABNORMAL HIGH (ref 65–99)

## 2016-12-08 SURGERY — COLONOSCOPY WITH PROPOFOL
Anesthesia: General

## 2016-12-08 MED ORDER — FENTANYL CITRATE (PF) 100 MCG/2ML IJ SOLN
INTRAMUSCULAR | Status: AC
  Filled 2016-12-08: qty 2

## 2016-12-08 MED ORDER — PROPOFOL 10 MG/ML IV BOLUS
INTRAVENOUS | Status: DC | PRN
  Administered 2016-12-08: 60 mg via INTRAVENOUS

## 2016-12-08 MED ORDER — SODIUM CHLORIDE 0.9 % IV SOLN
INTRAVENOUS | Status: DC
  Administered 2016-12-08: 09:00:00 via INTRAVENOUS

## 2016-12-08 MED ORDER — LIDOCAINE HCL (CARDIAC) 20 MG/ML IV SOLN
INTRAVENOUS | Status: DC | PRN
  Administered 2016-12-08: 40 mg via INTRAVENOUS

## 2016-12-08 MED ORDER — GLYCOPYRROLATE 0.2 MG/ML IJ SOLN
INTRAMUSCULAR | Status: AC
  Filled 2016-12-08: qty 1

## 2016-12-08 MED ORDER — PROPOFOL 500 MG/50ML IV EMUL
INTRAVENOUS | Status: DC | PRN
  Administered 2016-12-08: 150 ug/kg/min via INTRAVENOUS

## 2016-12-08 MED ORDER — PHENYLEPHRINE HCL 10 MG/ML IJ SOLN
INTRAMUSCULAR | Status: DC | PRN
  Administered 2016-12-08 (×3): 100 ug via INTRAVENOUS

## 2016-12-08 MED ORDER — PROPOFOL 500 MG/50ML IV EMUL
INTRAVENOUS | Status: AC
  Filled 2016-12-08: qty 50

## 2016-12-08 MED ORDER — GLYCOPYRROLATE 0.2 MG/ML IJ SOLN
INTRAMUSCULAR | Status: DC | PRN
  Administered 2016-12-08: 0.2 mg via INTRAVENOUS

## 2016-12-08 MED ORDER — IPRATROPIUM-ALBUTEROL 0.5-2.5 (3) MG/3ML IN SOLN
3.0000 mL | Freq: Once | RESPIRATORY_TRACT | Status: AC
  Administered 2016-12-08: 3 mL via RESPIRATORY_TRACT

## 2016-12-08 MED ORDER — IPRATROPIUM-ALBUTEROL 0.5-2.5 (3) MG/3ML IN SOLN
RESPIRATORY_TRACT | Status: AC
  Administered 2016-12-08: 3 mL via RESPIRATORY_TRACT
  Filled 2016-12-08: qty 3

## 2016-12-08 NOTE — Op Note (Signed)
[facility] Gastroenterology Patient Name: Samantha Mcmillan Procedure Date: 12/08/2016 9:30 AM MRN: [id number] Account #: [id number] Date of Birth: 1955-09-17 Admit Type: Outpatient Age: 61 Room: [facility] ENDO ROOM 4 Gender: Female Note Status: Finalized Procedure:            Upper GI endoscopy Indications:          Cirrhosis rule out esophageal varices Providers:            Lucilla Lame MD, MD Referring MD:         Casilda Carls, MD (Referring MD) Medicines:            Propofol per Anesthesia Complications:        No immediate complications. Procedure:            Pre-Anesthesia Assessment:                       - Prior to the procedure, a History and Physical was                        performed, and patient medications and allergies were                        reviewed. The patient's tolerance of previous                        anesthesia was also reviewed. The risks and benefits of                        the procedure and the sedation options and risks were                        discussed with the patient. All questions were                        answered, and informed consent was obtained. Prior                        Anticoagulants: The patient has taken no previous                        anticoagulant or antiplatelet agents. ASA Grade                        Assessment: II - A patient with mild systemic disease.                        After reviewing the risks and benefits, the patient was                        deemed in satisfactory condition to undergo the                        procedure.                       After obtaining informed consent, the endoscope was                        passed under direct vision. Throughout the procedure,  the patient's blood pressure, pulse, and oxygen                        saturations were monitored continuously. The Endoscope                        was introduced through the mouth, and advanced to the                         second part of duodenum. The upper GI endoscopy was                        accomplished without difficulty. The patient tolerated                        the procedure well. Findings:      One column of grade I varices were found in the lower third of the       esophagus,. No stigmata of recent bleeding were evident and no red wale       signs were present.      Localized mild inflammation characterized by erythema was found in the       gastric antrum.      The examined duodenum was normal. Impression:           - Non-bleeding grade I esophageal varices.                       - Gastritis.                       - Normal examined duodenum.                       - No specimens collected. Recommendation:       - Discharge patient to home.                       - Resume previous diet.                       - Continue present medications.                       - Perform a colonoscopy today. Procedure Code(s):    --- Professional ---                       [id number], Esophagogastroduodenoscopy, flexible, transoral;                        diagnostic, including collection of specimen(s) by                        brushing or washing, when performed (separate procedure) Diagnosis Code(s):    --- Professional ---                       K74.60, Unspecified cirrhosis of liver                       I85.10, Secondary esophageal varices without bleeding                       K29.70, Gastritis,  unspecified, without bleeding CPT copyright 2016 American Medical Association. All rights reserved. The codes documented in this report are preliminary and upon coder review may  be revised to meet current compliance requirements. Lucilla Lame MD, MD 12/08/2016 9:47:10 AM This report has been signed electronically. Number of Addenda: 0 Note Initiated On: 12/08/2016 9:30 AM      [facility]

## 2016-12-08 NOTE — Transfer of Care (Signed)
Immediate Anesthesia Transfer of Care Note  Patient: Samantha Mcmillan  Procedure(s) Performed: Procedure(s): COLONOSCOPY WITH PROPOFOL (N/A) ESOPHAGOGASTRODUODENOSCOPY (EGD) WITH PROPOFOL (N/A)  Patient Location: PACU and Endoscopy Unit  Anesthesia Type:General  Level of Consciousness: sedated  Airway & Oxygen Therapy: Patient Spontanous Breathing and Patient connected to nasal cannula oxygen  Post-op Assessment: Report given to RN and Post -op Vital signs reviewed and stable  Post vital signs: Reviewed and stable  Last Vitals:  Vitals:   12/08/16 0839 12/08/16 1005  BP: (!) 101/54 (!) 110/46  Pulse: 60 73  Resp: 18 18  Temp: (!) 36.1 C (!) 36.1 C  SpO2: 75% 449%    Complications: No apparent anesthesia complications

## 2016-12-08 NOTE — H&P (Signed)
Samantha Lame, MD [address] [place], [place] [address] Phone:[phone] Fax : [phone]  Primary Care Physician:  Casilda Carls, MD Primary Gastroenterologist:  Dr. Allen Norris  Pre-Procedure History & Physical: HPI:  Samantha Mcmillan is a 61 y.o. female is here for an endoscopy and colonoscopy.   Past Medical History:  Diagnosis Date  . Acute on chronic diastolic CHF (congestive heart failure) ([place]) 05/16/2014  . Anemia   . Anxiety   . Arthritis    "some in my feet" (05/04/2014)  . Asthma   . Chronic bronchitis ([place])    "get it pretty much q yr"   . Chronic lower back pain   . Chronic respiratory failure ([place])   . Cirrhosis of liver ([place])   . Cirrhosis of liver ([place])   . Colon polyp   . COPD (chronic obstructive pulmonary disease) ([place])   . Depression   . Family history of adverse reaction to anesthesia    "my sister doesn't wake up good when she's put deep to sleep"  . GERD (gastroesophageal reflux disease)   . Headache    "more than a couple times/wk" (05/04/2014)  . Heart murmur   . High cholesterol   . History of blood transfusion    "related to my anemia"  . Hypertension   . IBS (irritable bowel syndrome)   . IDA (iron deficiency anemia) 08/14/2014  . Iron deficiency anemia   . OCD (obsessive compulsive disorder)   . On home oxygen therapy    "2L; 24/7" (05/04/2014)  . Panic attack   . Personal history of tobacco use, presenting hazards to health 06/21/2015  . Pneumonia "several times"  . PTSD (post-traumatic stress disorder)   . Sleep apnea    "can't tolerate CPAP" (05/04/2014)  . Type II diabetes mellitus ([place])     Past Surgical History:  Procedure Laterality Date  . BACK SURGERY    . CATARACT EXTRACTION W/ INTRAOCULAR LENS  IMPLANT, BILATERAL Bilateral 2005  . CESAREAN SECTION  1979  . COLONOSCOPY  2014  . DILATION AND CURETTAGE OF UTERUS    . INCISION AND DRAINAGE OF WOUND Right 2009   "leg mauled  by dog"  . PERIPHERAL VASCULAR  CATHETERIZATION N/A 08/16/2014   Procedure: PICC Line Insertion;  Surgeon: Algernon Huxley, MD;  Location: [facility];  Service: Cardiovascular;  Laterality: N/A;  . POSTERIOR FUSION CERVICAL SPINE  2015   "rebuilt 3 of my neck vertebrae"  . TUBAL LIGATION  1979  . UPPER GASTROINTESTINAL ENDOSCOPY      Prior to Admission medications   Medication Sig Start Date End Date Taking? Authorizing Provider  albuterol (PROAIR HFA) 108 (90 Base) MCG/ACT inhaler ProAir HFA 90 mcg/actuation aerosol inhaler   Yes [provider]  albuterol (PROVENTIL) (2.5 MG/3ML) 0.083% nebulizer solution Take 2.5 mg by nebulization every 6 (six) hours as needed for wheezing or shortness of breath.   Yes [provider]  allopurinol (ZYLOPRIM) 100 MG tablet Take 100 mg by mouth daily.    Yes [provider]  ARIPiprazole (ABILIFY) 5 MG tablet Take 5 mg by mouth at bedtime.    Yes [provider]  atorvastatin (LIPITOR) 10 MG tablet Take 10 mg by mouth at bedtime.    Yes [provider]  budesonide-formoterol (SYMBICORT) 80-4.5 MCG/ACT inhaler Inhale 2 puffs into the lungs 2 (two) times daily.    Yes [provider]  bumetanide (BUMEX) 1 MG tablet Take 1 mg by  mouth 2 (two) times daily. 07/04/15  Yes [provider]  fluticasone (FLONASE) 50 MCG/ACT nasal spray Place 1-2 sprays into both nostrils daily as needed for rhinitis.   Yes [provider]  HYDROmorphone (DILAUDID) 2 MG tablet Take 1 tablet (2 mg total) by mouth every 6 (six) hours as needed for severe pain. Patient taking differently: Take 2 mg by mouth every 12 (twelve) hours as needed for severe pain.  07/09/15  Yes Mody, Ulice Bold, MD  insulin glargine (LANTUS) 100 UNIT/ML injection Inject into the skin at bedtime.   Yes [provider]  lamoTRIgine (LAMICTAL) 150 MG tablet Take 150 mg by mouth at bedtime.    Yes [provider]  levothyroxine (SYNTHROID, LEVOTHROID) 88 MCG  tablet Take 88 mcg by mouth daily before breakfast.    Yes [provider]  metoCLOPramide (REGLAN) 10 MG tablet Take 10 mg by mouth 4 (four) times daily as needed for nausea or vomiting.    Yes [provider]  metoprolol (LOPRESSOR) 50 MG tablet Take 50 mg by mouth 2 (two) times daily.   Yes [provider]  Multiple Vitamin (MULTIVITAMIN WITH MINERALS) TABS tablet Take 1 tablet by mouth daily. 05/18/14  Yes Rama, Venetia Maxon, MD  pantoprazole (PROTONIX) 40 MG tablet Take 40 mg by mouth daily.   Yes [provider]  sertraline (ZOLOFT) 100 MG tablet Take 100 mg by mouth 2 (two) times daily.    Yes [provider]  spironolactone (ALDACTONE) 50 MG tablet Take 1 tablet (50 mg total) by mouth daily. 03/07/16 03/07/17 Yes Earleen Newport, MD  Buprenorphine (BUTRANS) 15 MCG/HR PTWK Place 20 mcg onto the skin once a week. Pt applies on Sunday. Patient not taking: Reported on 11/30/2016 07/09/15   Bettey Costa, MD  clonazePAM (KLONOPIN) 0.5 MG tablet clonazepam 0.5 mg tablet    [provider]  cyclobenzaprine (FLEXERIL) 10 MG tablet cyclobenzaprine 10 mg tablet    [provider]  ferrous fumarate-iron polysaccharide complex (TANDEM) 162-115.2 MG CAPS capsule Take 1 capsule by mouth 2 (two) times daily after a meal. Patient not taking: Reported on 11/30/2016 02/26/16   Heath Lark, MD  hydrocortisone-pramoxine (ANALPRAM HC) 2.5-1 % rectal cream Place 1 application rectally 2 (two) times daily. Patient not taking: Reported on 11/30/2016 05/21/16   Christene Lye, MD  Ipratropium-Albuterol (COMBIVENT RESPIMAT) 20-100 MCG/ACT AERS respimat Combivent Respimat 20 mcg-100 mcg/actuation solution for inhalation    [provider]  lactulose (CHRONULAC) 10 GM/15ML solution Take 20 g by mouth 2 (two) times daily.     [provider]  senna-docusate (SENOKOT-S) 8.6-50 MG tablet Take 1 tablet by mouth 2 (two) times daily.     [provider]    Allergies as of 12/01/2016 - Review Complete 11/30/2016  Allergen Reaction Noted  . Iodine Shortness Of Breath 03/15/2014  . Latex Anaphylaxis 03/15/2014  . Other Rash, Other (See Comments), Shortness Of Breath, Anaphylaxis, and Swelling 03/15/2014  . Oxycodone-acetaminophen Other (See Comments), Rash, Swelling, and Shortness Of Breath 03/07/2013  . Shellfish-derived products Anaphylaxis, Rash, Shortness Of Breath, and Swelling 03/28/2013  . Amitriptyline Other (See Comments) 03/07/2013  . Wellbutrin [bupropion] Other (See Comments) 03/15/2014  . Augmentin [amoxicillin-pot clavulanate] Rash and Other (See Comments) 03/15/2014  . Codeine Rash 03/15/2014  . Cortisone Rash 03/15/2014  . Diphenhydramine Rash 03/15/2014  . Vicodin [hydrocodone-acetaminophen] Rash and Other (See Comments) 03/15/2014    Family History  Problem Relation Age of Onset  . Cancer Father  pancreatic  . Cancer Brother   . Breast cancer Maternal Aunt     Social History   Social History  . Marital status: Divorced    Spouse name: N/A  . Number of children: N/A  . Years of education: N/A   Occupational History  . disabled    Social History Main Topics  . Smoking status: Current Every Day Smoker    Packs/day: 0.50    Years: 48.00    Types: Cigarettes  . Smokeless tobacco: Never Used  . Alcohol use No  . Drug use: No  . Sexual activity: Not Currently   Other Topics Concern  . Not on file   Social History Narrative  . No narrative on file    Review of Systems: See HPI, otherwise negative ROS  Physical Exam: BP (!) 101/54   Pulse 60   Temp (!) 97 F (36.1 C) (Tympanic)   Resp 18   Ht 5\' 4"  (1.626 m)   Wt 160 lb (72.6 kg)   LMP  (LMP Unknown)   SpO2 90%   BMI 27.46 kg/m  General:   Alert,  pleasant and cooperative in NAD Head:  Normocephalic and atraumatic. Neck:  Supple; no masses or thyromegaly. Lungs:  Clear throughout to auscultation.    Heart:   Regular rate and rhythm. Abdomen:  Soft, nontender and nondistended. Normal bowel sounds, without guarding, and without rebound.   Neurologic:  Alert and  oriented x4;  grossly normal neurologically.  Impression/Plan: Samantha Mcmillan is here for an endoscopy and colonoscopy to be performed for cirrhosis and history of colon polyps  Risks, benefits, limitations, and alternatives regarding  endoscopy and colonoscopy have been reviewed with the patient.  Questions have been answered.  All parties agreeable.   Samantha Lame, MD  12/08/2016, 8:51 AM

## 2016-12-08 NOTE — Op Note (Signed)
[facility] Gastroenterology Patient Name: Samantha Mcmillan Procedure Date: 12/08/2016 9:29 AM MRN: [id number] Account #: [id number] Date of Birth: September 03, 1955 Admit Type: Outpatient Age: 61 Room: [facility] ENDO ROOM 4 Gender: Female Note Status: Finalized Procedure:            Colonoscopy Indications:          High risk colon cancer surveillance: Personal history                        of colonic polyps Providers:            Lucilla Lame MD, MD Referring MD:         Casilda Carls, MD (Referring MD) Medicines:            Propofol per Anesthesia Complications:        No immediate complications. Procedure:            Pre-Anesthesia Assessment:                       - Prior to the procedure, a History and Physical was                        performed, and patient medications and allergies were                        reviewed. The patient's tolerance of previous                        anesthesia was also reviewed. The risks and benefits of                        the procedure and the sedation options and risks were                        discussed with the patient. All questions were                        answered, and informed consent was obtained. Prior                        Anticoagulants: The patient has taken no previous                        anticoagulant or antiplatelet agents. ASA Grade                        Assessment: II - A patient with mild systemic disease.                        After reviewing the risks and benefits, the patient was                        deemed in satisfactory condition to undergo the                        procedure.                       After obtaining informed consent, the colonoscope was  passed under direct vision. Throughout the procedure,                        the patient's blood pressure, pulse, and oxygen                        saturations were monitored continuously. The   Colonoscope was introduced through the anus and                        advanced to the the cecum, identified by appendiceal                        orifice and ileocecal valve. The colonoscopy was                        performed without difficulty. The patient tolerated the                        procedure well. The quality of the bowel preparation                        was excellent. Findings:      The perianal and digital rectal examinations were normal.      Two sessile polyps were found in the transverse colon. The polyps were 5       to 6 mm in size. These polyps were removed with a cold snare. Resection       and retrieval were complete.      A 4 mm polyp was found in the descending colon. The polyp was sessile.       The polyp was removed with a cold snare. Resection and retrieval were       complete.      Multiple small-mouthed diverticula were found in the sigmoid colon.      Non-bleeding internal hemorrhoids were found during retroflexion. The       hemorrhoids were Grade I (internal hemorrhoids that do not prolapse). Impression:           - Two 5 to 6 mm polyps in the transverse colon, removed                        with a cold snare. Resected and retrieved.                       - One 4 mm polyp in the descending colon, removed with                        a cold snare. Resected and retrieved.                       - Diverticulosis in the sigmoid colon.                       - Non-bleeding internal hemorrhoids. Recommendation:       - Discharge patient to home.                       - Resume previous diet.                       -  Continue present medications.                       - Repeat colonoscopy in 5 years for surveillance. Procedure Code(s):    --- Professional ---                       [phone], Colonoscopy, flexible; with removal of tumor(s),                        polyp(s), or other lesion(s) by snare technique Diagnosis Code(s):    --- Professional ---                        Z86.010, Personal history of colonic polyps                       D12.3, Benign neoplasm of transverse colon (hepatic                        flexure or splenic flexure)                       D12.4, Benign neoplasm of descending colon CPT copyright 2016 American Medical Association. All rights reserved. The codes documented in this report are preliminary and upon coder review may  be revised to meet current compliance requirements. Lucilla Lame MD, MD 12/08/2016 10:00:51 AM This report has been signed electronically. Number of Addenda: 0 Note Initiated On: 12/08/2016 9:29 AM Scope Withdrawal Time: 0 hours 6 minutes 6 seconds  Total Procedure Duration: 0 hours 10 minutes 17 seconds       [facility]

## 2016-12-08 NOTE — Anesthesia Preprocedure Evaluation (Signed)
Anesthesia Evaluation  Patient identified by MRN, date of birth, ID band Patient awake    Reviewed: Allergy & Precautions, H&P , NPO status , Patient's Chart, lab work & pertinent test results  History of Anesthesia Complications (+) Family history of anesthesia reactionNegative for: history of anesthetic complications  Airway Mallampati: III  TM Distance: <3 FB Neck ROM: limited    Dental  (+) Poor Dentition, Chipped, Missing, Loose   Pulmonary shortness of breath and with exertion, asthma , sleep apnea , pneumonia, COPD,  COPD inhaler, Current Smoker,           Cardiovascular Exercise Tolerance: Poor hypertension, (-) angina+ Peripheral Vascular Disease and +CHF  (-) Past MI + Valvular Problems/Murmurs      Neuro/Psych  Headaches, PSYCHIATRIC DISORDERS Anxiety Depression    GI/Hepatic Neg liver ROS, GERD  ,  Endo/Other  negative endocrine ROSdiabetes, Type 2Hypothyroidism   Renal/GU Renal disease  negative genitourinary   Musculoskeletal  (+) Arthritis ,   Abdominal   Peds  Hematology negative hematology ROS (+)   Anesthesia Other Findings Past Medical History: 05/16/2014: Acute on chronic diastolic CHF (congestive heart failure)  (HCC) No date: Anemia No date: Anxiety No date: Arthritis     Comment:  "some in my feet" (05/04/2014) No date: Asthma No date: Chronic bronchitis (HCC)     Comment:  "get it pretty much q yr"  No date: Chronic lower back pain No date: Chronic respiratory failure (HCC) No date: Cirrhosis of liver (HCC) No date: Cirrhosis of liver (HCC) No date: Colon polyp No date: COPD (chronic obstructive pulmonary disease) (HCC) No date: Depression No date: Family history of adverse reaction to anesthesia     Comment:  "my sister doesn't wake up good when she's put deep to               sleep" No date: GERD (gastroesophageal reflux disease) No date: Headache     Comment:  "more than a  couple times/wk" (05/04/2014) No date: Heart murmur No date: High cholesterol No date: History of blood transfusion     Comment:  "related to my anemia" No date: Hypertension No date: IBS (irritable bowel syndrome) 08/14/2014: IDA (iron deficiency anemia) No date: Iron deficiency anemia No date: OCD (obsessive compulsive disorder) No date: On home oxygen therapy     Comment:  "2L; 24/7" (05/04/2014) No date: Panic attack 06/21/2015: Personal history of tobacco use, presenting hazards to  health "several times": Pneumonia No date: PTSD (post-traumatic stress disorder) No date: Sleep apnea     Comment:  "can't tolerate CPAP" (05/04/2014) No date: Type II diabetes mellitus ([place])  Past Surgical History: No date: BACK SURGERY 2005: CATARACT EXTRACTION W/ INTRAOCULAR LENS  IMPLANT, BILATERAL;  Bilateral 1979: CESAREAN SECTION 2014: COLONOSCOPY No date: DILATION AND CURETTAGE OF UTERUS 2009: INCISION AND DRAINAGE OF WOUND; Right     Comment:  "leg mauled  by dog" 08/16/2014: PERIPHERAL VASCULAR CATHETERIZATION; N/A     Comment:  Procedure: PICC Line Insertion;  Surgeon: Algernon Huxley,               MD;  Location: [place] [facility];  Service:               Cardiovascular;  Laterality: N/A; 2015: POSTERIOR FUSION CERVICAL SPINE     Comment:  "rebuilt 3 of my neck vertebrae" 1979: TUBAL LIGATION No date: UPPER GASTROINTESTINAL ENDOSCOPY     Reproductive/Obstetrics negative OB ROS  Anesthesia Physical Anesthesia Plan  ASA: IV  Anesthesia Plan: General   Post-op Pain Management:    Induction: Intravenous  PONV Risk Score and Plan: Propofol infusion  Airway Management Planned: Natural Airway and Nasal Cannula  Additional Equipment:   Intra-op Plan:   Post-operative Plan:   Informed Consent: I have reviewed the patients History and Physical, chart, labs and discussed the procedure including the risks, benefits and  alternatives for the proposed anesthesia with the patient or authorized representative who has indicated his/her understanding and acceptance.   Dental Advisory Given  Plan Discussed with: Anesthesiologist, CRNA and Surgeon  Anesthesia Plan Comments: (Patient consented for risks of anesthesia including but not limited to:  - adverse reactions to medications - risk of intubation if required - damage to teeth, lips or other oral mucosa - sore throat or hoarseness - Damage to heart, brain, lungs or loss of life  Patient voiced understanding.)        Anesthesia Quick Evaluation

## 2016-12-08 NOTE — Anesthesia Post-op Follow-up Note (Signed)
Anesthesia QCDR form completed.        

## 2016-12-08 NOTE — Anesthesia Postprocedure Evaluation (Signed)
Anesthesia Post Note  Patient: KMYA PLACIDE  Procedure(s) Performed: COLONOSCOPY WITH PROPOFOL (N/A ) ESOPHAGOGASTRODUODENOSCOPY (EGD) WITH PROPOFOL (N/A )  Patient location during evaluation: Endoscopy Anesthesia Type: General Level of consciousness: awake and alert Pain management: pain level controlled Vital Signs Assessment: post-procedure vital signs reviewed and stable Respiratory status: spontaneous breathing, nonlabored ventilation, respiratory function stable and patient connected to nasal cannula oxygen Cardiovascular status: blood pressure returned to baseline and stable Postop Assessment: no apparent nausea or vomiting Anesthetic complications: no     Last Vitals:  Vitals:   12/08/16 1025 12/08/16 1035  BP: (!) 112/56 104/87  Pulse: 68 67  Resp: 18 18  Temp:    SpO2: 100% 95%    Last Pain:  Vitals:   12/08/16 1005  TempSrc: Tympanic                 Precious Haws Piscitello

## 2016-12-08 NOTE — Anesthesia Procedure Notes (Signed)
Performed by: Doreen Salvage Pre-anesthesia Checklist: Patient identified, Emergency Drugs available, Suction available and Patient being monitored Patient Re-evaluated:Patient Re-evaluated prior to induction Oxygen Delivery Method: Nasal cannula Induction Type: IV induction Dental Injury: Teeth and Oropharynx as per pre-operative assessment  Comments: Nasal cannula with etCO2 monitoring

## 2016-12-09 ENCOUNTER — Encounter: Payer: Self-pay | Admitting: Gastroenterology

## 2016-12-09 LAB — SURGICAL PATHOLOGY

## 2016-12-10 ENCOUNTER — Other Ambulatory Visit: Payer: Self-pay | Admitting: Internal Medicine

## 2016-12-10 ENCOUNTER — Telehealth: Payer: Self-pay | Admitting: *Deleted

## 2016-12-10 ENCOUNTER — Inpatient Hospital Stay: Payer: Medicare Other

## 2016-12-10 ENCOUNTER — Inpatient Hospital Stay: Payer: Medicare Other | Attending: Internal Medicine | Admitting: Internal Medicine

## 2016-12-10 ENCOUNTER — Ambulatory Visit
Admission: RE | Admit: 2016-12-10 | Discharge: 2016-12-10 | Disposition: A | Discharge status: Home / Self Care | Payer: Medicare Other | Source: Ambulatory Visit | Attending: Internal Medicine | Admitting: Internal Medicine

## 2016-12-10 ENCOUNTER — Other Ambulatory Visit: Payer: Self-pay

## 2016-12-10 VITALS — BP 118/66 | HR 71 | Temp 97.5°F | Resp 22 | Ht 64.0 in | Wt 157.4 lb

## 2016-12-10 DIAGNOSIS — Z794 Long term (current) use of insulin: Secondary | ICD-10-CM | POA: Insufficient documentation

## 2016-12-10 DIAGNOSIS — K7460 Unspecified cirrhosis of liver: Secondary | ICD-10-CM | POA: Insufficient documentation

## 2016-12-10 DIAGNOSIS — R772 Abnormality of alphafetoprotein: Secondary | ICD-10-CM | POA: Diagnosis present

## 2016-12-10 DIAGNOSIS — R161 Splenomegaly, not elsewhere classified: Secondary | ICD-10-CM | POA: Insufficient documentation

## 2016-12-10 DIAGNOSIS — R948 Abnormal results of function studies of other organs and systems: Secondary | ICD-10-CM | POA: Diagnosis not present

## 2016-12-10 DIAGNOSIS — N189 Chronic kidney disease, unspecified: Secondary | ICD-10-CM | POA: Insufficient documentation

## 2016-12-10 DIAGNOSIS — E871 Hypo-osmolality and hyponatremia: Secondary | ICD-10-CM | POA: Diagnosis not present

## 2016-12-10 DIAGNOSIS — I129 Hypertensive chronic kidney disease with stage 1 through stage 4 chronic kidney disease, or unspecified chronic kidney disease: Secondary | ICD-10-CM | POA: Insufficient documentation

## 2016-12-10 DIAGNOSIS — R944 Abnormal results of kidney function studies: Secondary | ICD-10-CM | POA: Diagnosis not present

## 2016-12-10 DIAGNOSIS — K5520 Angiodysplasia of colon without hemorrhage: Secondary | ICD-10-CM | POA: Insufficient documentation

## 2016-12-10 DIAGNOSIS — I509 Heart failure, unspecified: Secondary | ICD-10-CM | POA: Diagnosis not present

## 2016-12-10 DIAGNOSIS — D61818 Other pancytopenia: Secondary | ICD-10-CM | POA: Diagnosis not present

## 2016-12-10 DIAGNOSIS — D649 Anemia, unspecified: Secondary | ICD-10-CM | POA: Diagnosis not present

## 2016-12-10 DIAGNOSIS — J449 Chronic obstructive pulmonary disease, unspecified: Secondary | ICD-10-CM | POA: Insufficient documentation

## 2016-12-10 DIAGNOSIS — D696 Thrombocytopenia, unspecified: Secondary | ICD-10-CM

## 2016-12-10 DIAGNOSIS — R7989 Other specified abnormal findings of blood chemistry: Secondary | ICD-10-CM

## 2016-12-10 DIAGNOSIS — R5383 Other fatigue: Secondary | ICD-10-CM | POA: Diagnosis not present

## 2016-12-10 DIAGNOSIS — Z79899 Other long term (current) drug therapy: Secondary | ICD-10-CM | POA: Diagnosis not present

## 2016-12-10 LAB — APTT: aPTT: 31 seconds (ref 24–36)

## 2016-12-10 LAB — COMPREHENSIVE METABOLIC PANEL
ALBUMIN: 3.2 g/dL — AB (ref 3.5–5.0)
ALT: 15 U/L (ref 14–54)
AST: 29 U/L (ref 15–41)
Alkaline Phosphatase: 142 U/L — ABNORMAL HIGH (ref 38–126)
Anion gap: 11 (ref 5–15)
BUN: 73 mg/dL — AB (ref 6–20)
CHLORIDE: 87 mmol/L — AB (ref 101–111)
CO2: 27 mmol/L (ref 22–32)
Calcium: 8.8 mg/dL — ABNORMAL LOW (ref 8.9–10.3)
Creatinine, Ser: 2.42 mg/dL — ABNORMAL HIGH (ref 0.44–1.00)
GFR calc Af Amer: 23 mL/min — ABNORMAL LOW (ref >60)
GFR calc non Af Amer: 20 mL/min — ABNORMAL LOW (ref >60)
GLUCOSE: 223 mg/dL — AB (ref 65–99)
POTASSIUM: 4.2 mmol/L (ref 3.5–5.1)
Sodium: 125 mmol/L — ABNORMAL LOW (ref 135–145)
Total Bilirubin: 0.6 mg/dL (ref 0.3–1.2)
Total Protein: 6.7 g/dL (ref 6.5–8.1)

## 2016-12-10 LAB — PROTIME-INR
INR: 1.04
Prothrombin Time: 13.5 seconds (ref 11.4–15.2)

## 2016-12-10 LAB — CBC WITH DIFFERENTIAL/PLATELET
Basophils Absolute: 0 10*3/uL (ref 0–0.1)
Basophils Relative: 1 %
EOS PCT: 0 %
Eosinophils Absolute: 0 10*3/uL (ref 0–0.7)
HEMATOCRIT: 33.1 % — AB (ref 35.0–47.0)
Hemoglobin: 11.2 g/dL — ABNORMAL LOW (ref 12.0–16.0)
LYMPHS ABS: 0.9 10*3/uL — AB (ref 1.0–3.6)
LYMPHS PCT: 19 %
MCH: 33.6 pg (ref 26.0–34.0)
MCHC: 33.7 g/dL (ref 32.0–36.0)
MCV: 99.8 fL (ref 80.0–100.0)
MONO ABS: 0.2 10*3/uL (ref 0.2–0.9)
Monocytes Relative: 4 %
NEUTROS ABS: 3.7 10*3/uL (ref 1.4–6.5)
Neutrophils Relative %: 76 %
PLATELETS: 112 10*3/uL — AB (ref 150–440)
RBC: 3.32 MIL/uL — AB (ref 3.80–5.20)
RDW: 15 % — ABNORMAL HIGH (ref 11.5–14.5)
WBC: 4.8 10*3/uL (ref 3.6–11.0)

## 2016-12-10 LAB — POCT I-STAT CREATININE: Creatinine, Ser: 2.5 mg/dL — ABNORMAL HIGH (ref 0.44–1.00)

## 2016-12-10 MED ORDER — FERROUS FUM-IRON POLYSACCH 162-115.2 MG PO CAPS: 1.0000 | ORAL_CAPSULE | Freq: Two times a day (BID) | ORAL | 11 refills | Status: AC

## 2016-12-10 NOTE — Telephone Encounter (Signed)
Had to do MRI without contrast due to GFR and got order from radiologist Dr Polly Cobia

## 2016-12-10 NOTE — Assessment & Plan Note (Addendum)
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 Discussed with Dr.Kolluru. Also instructed pt to call Dr.Koluru office.   # Sodium today is 126/secondary to cirrhosis. Chronic. She continues to follow up with nephrology.  # follow up in 6 months/labs/AFP/ iron studies/ferritn/AFP.

## 2016-12-10 NOTE — Progress Notes (Signed)
Samantha Mcmillan OFFICE PROGRESS NOTE  Patient Care Team: Casilda Carls, MD as PCP - General (Internal Medicine) Christene Lye, MD (General Surgery) Alisa Graff, FNP as Nurse Practitioner (Family Medicine) Lavonia Dana, MD as Consulting Physician (Nephrology) Nathanial Rancher, MD as Referring Physician (Anesthesiology) Leonel Ramsay, MD as Consulting Physician (Infectious Diseases)  Anemia     SUMMARY of HEMATOLOGIC HISTORY:  # PANCYTOPENIA  sec to cirrhosis/spleenomegaly; Anemia sec to IDA [?duodenal AVMs] s/p IV iron; s/p EGD [2014]; s/p Colo [Dr.Wohl]  # Elevated AFP-OCT 2019-MRI non-contrast liver [creat 2.5]- neg for any lesions.   # COPD on 02; CHF/diastolic; CKD/Hyponaremia [Dr.Kolluru]    INTERVAL HISTORY:  61 year old female patient with a history of cirrhosis/splenomegaly of unclear etiology- is here for follow-up of pancytopenia/ review the results of the MRI of the liver that was ordered for elevated AFP.  Patient unfortunately did not get contrast with MRI because of elevated creatinine at 2.5. Patient stated that she had been recommended increased dose of diuretics given her fluid overload as per nephrology.    She has mild fatigue. Not any worse. Denies any blood in stools black stools. She continues to be on oral iron pills.    REVIEW OF SYSTEMS:  Otherwise no fevers or chills. Complete 10 point review of system is done which is negative except mentioned above in history of present illness .   Social Hx- denies any current smoking or alcohol.Patient has been recently discharged from rehabilitation.  I have reviewed the past medical history, past surgical history, social history and family history with the patient and they are unchanged from previous note unless stated above.  ALLERGIES:  is allergic to iodine; latex; other; oxycodone; oxycodone-acetaminophen; shellfish-derived products; amitriptyline; fish-derived products; tape;  wellbutrin [bupropion]; augmentin [amoxicillin-pot clavulanate]; codeine; cortisone; diphenhydramine; hydrocodone-acetaminophen; and vicodin [hydrocodone-acetaminophen].  MEDICATIONS:  Current Outpatient Prescriptions  Medication Sig Dispense Refill  . albuterol (PROVENTIL) (2.5 MG/3ML) 0.083% nebulizer solution Take 2.5 mg by nebulization every 6 (six) hours as needed for wheezing or shortness of breath.    . allopurinol (ZYLOPRIM) 100 MG tablet Take 100 mg by mouth daily.     . ARIPiprazole (ABILIFY) 5 MG tablet Take 5 mg by mouth at bedtime.     Marland Kitchen atorvastatin (LIPITOR) 10 MG tablet Take 10 mg by mouth at bedtime.     . budesonide-formoterol (SYMBICORT) 80-4.5 MCG/ACT inhaler Inhale 2 puffs into the lungs 2 (two) times daily.     . bumetanide (BUMEX) 1 MG tablet Take 2 mg by mouth 2 (two) times daily.     . Calcium Carbonate-Vitamin D (CALTRATE 600+D PO) Take 1 tablet by mouth daily.    . ferrous fumarate-iron polysaccharide complex (TANDEM) 162-115.2 MG CAPS capsule Take 1 capsule by mouth 2 (two) times daily after a meal. 60 capsule 11  . HYDROmorphone (DILAUDID) 2 MG tablet Take 1 tablet (2 mg total) by mouth every 6 (six) hours as needed for severe pain. (Patient taking differently: Take 2 mg by mouth every 8 (eight) hours as needed for severe pain. ) 30 tablet 0  . insulin glargine (LANTUS) 100 UNIT/ML injection Inject 30 Units into the skin every morning.     . lamoTRIgine (LAMICTAL) 150 MG tablet Take 150 mg by mouth at bedtime.     Marland Kitchen levothyroxine (SYNTHROID, LEVOTHROID) 88 MCG tablet Take 88 mcg by mouth daily before breakfast.     . magnesium oxide (MAG-OX) 400 MG tablet Take 1 tablet by mouth daily.    Marland Kitchen  metoprolol (LOPRESSOR) 50 MG tablet Take 50 mg by mouth 2 (two) times daily.    . Multiple Vitamin (MULTIVITAMIN WITH MINERALS) TABS tablet Take 1 tablet by mouth daily.    . ondansetron (ZOFRAN) 4 MG tablet Take 1 tablet by mouth 2 (two) times daily as needed for nausea/vomiting.     . pantoprazole (PROTONIX) 40 MG tablet Take 40 mg by mouth daily.    . sertraline (ZOLOFT) 100 MG tablet Take 100 mg by mouth 2 (two) times daily.     Marland Kitchen spironolactone (ALDACTONE) 50 MG tablet Take 1 tablet (50 mg total) by mouth daily. 30 tablet 11  . albuterol (PROAIR HFA) 108 (90 Base) MCG/ACT inhaler ProAir HFA 90 mcg/actuation aerosol inhaler    . fluticasone (FLONASE) 50 MCG/ACT nasal spray Place 1-2 sprays into both nostrils daily as needed for rhinitis.    Marland Kitchen metoCLOPramide (REGLAN) 10 MG tablet Take 10 mg by mouth 4 (four) times daily as needed for nausea or vomiting.      No current facility-administered medications for this visit.     PHYSICAL EXAMINATION:   BP 118/66 (Patient Position: Sitting)   Pulse 71   Temp (!) 97.5 F (36.4 C) (Tympanic)   Resp (!) 22   Ht 5\' 4"  (1.626 m)   Wt 157 lb 6.4 oz (71.4 kg)   LMP  (LMP Unknown)   BMI 27.02 kg/m   Filed Weights   12/10/16 1523  Weight: 157 lb 6.4 oz (71.4 kg)    GENERAL:alert, no distress and comfortable.Patient alone. Patient is wheelchair. Her left knee is bandaged. EYES: normal, Conjunctiva are pink and non-injected, sclera clear OROPHARYNX: Poor dentition ulceration  NECK: No thyromegaly LYMPH:  no palpable lymphadenopathy in the cervical, axillary or inguinal LUNGS: clear to auscultation with normal breathing effort; No Wheeze or crackles  Cardio-vascular- Regular Rate & Rythm and no murmurs and no lower extremity edema ABDOMEN:abdomen soft, non-tender and normal bowel sounds;positive for splenomegaly  Musculoskeletal:no cyanosis of digits and no clubbing  NEURO: alert & oriented x 3 with fluent speech, no focal motor/sensory deficits. SKIN:Multiple bruises noted on the anterior chest and bilateral upper extremities  LABORATORY DATA:  I have reviewed the data as listed    Component Value Date/Time   NA 125 (L) 12/10/2016 1450   NA 128 (A) 05/31/2014   NA 136 03/15/2014 1336   K 4.2 12/10/2016 1450   K  3.1 (L) 03/15/2014 1336   CL 87 (L) 12/10/2016 1450   CL 98 03/15/2014 1336   CO2 27 12/10/2016 1450   CO2 29 03/15/2014 1336   GLUCOSE 223 (H) 12/10/2016 1450   GLUCOSE 57 (L) 03/15/2014 1336   BUN 73 (H) 12/10/2016 1450   BUN 34 (A) 05/31/2014   BUN 8 03/15/2014 1336   CREATININE 2.42 (H) 12/10/2016 1450   CREATININE 1.05 03/15/2014 1336   CALCIUM 8.8 (L) 12/10/2016 1450   CALCIUM 7.7 (L) 03/15/2014 1336   PROT 6.7 12/10/2016 1450   PROT 5.9 (L) 03/15/2014 1336   ALBUMIN 3.2 (L) 12/10/2016 1450   ALBUMIN 2.1 (L) 03/15/2014 1336   AST 29 12/10/2016 1450   AST 40 (H) 03/15/2014 1336   ALT 15 12/10/2016 1450   ALT 13 (L) 03/15/2014 1336   ALKPHOS 142 (H) 12/10/2016 1450   ALKPHOS 221 (H) 03/15/2014 1336   BILITOT 0.6 12/10/2016 1450   BILITOT 0.4 03/15/2014 1336   GFRNONAA 20 (L) 12/10/2016 1450   GFRNONAA 57 (L) 03/15/2014 1336   GFRNONAA >  60 03/06/2013 1855   GFRAA 23 (L) 12/10/2016 1450   GFRAA >60 03/15/2014 1336   GFRAA >60 03/06/2013 1855    No results found for: SPEP, UPEP  Lab Results  Component Value Date   WBC 4.8 12/10/2016   NEUTROABS 3.7 12/10/2016   HGB 11.2 (L) 12/10/2016   HCT 33.1 (L) 12/10/2016   MCV 99.8 12/10/2016   PLT 112 (L) 12/10/2016      Chemistry      Component Value Date/Time   NA 125 (L) 12/10/2016 1450   NA 128 (A) 05/31/2014   NA 136 03/15/2014 1336   K 4.2 12/10/2016 1450   K 3.1 (L) 03/15/2014 1336   CL 87 (L) 12/10/2016 1450   CL 98 03/15/2014 1336   CO2 27 12/10/2016 1450   CO2 29 03/15/2014 1336   BUN 73 (H) 12/10/2016 1450   BUN 34 (A) 05/31/2014   BUN 8 03/15/2014 1336   CREATININE 2.42 (H) 12/10/2016 1450   CREATININE 1.05 03/15/2014 1336   GLU 120 05/31/2014      Component Value Date/Time   CALCIUM 8.8 (L) 12/10/2016 1450   CALCIUM 7.7 (L) 03/15/2014 1336   ALKPHOS 142 (H) 12/10/2016 1450   ALKPHOS 221 (H) 03/15/2014 1336   AST 29 12/10/2016 1450   AST 40 (H) 03/15/2014 1336   ALT 15 12/10/2016 1450   ALT  13 (L) 03/15/2014 1336   BILITOT 0.6 12/10/2016 1450   BILITOT 0.4 03/15/2014 1336       RADIOGRAPHIC STUDIES: I have personally reviewed the radiological images as listed and agreed with the findings in the report. No results found.   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 Discussed with Dr.Kolluru. Also instructed pt to call Dr.Koluru office.   # Sodium today is 126/secondary to cirrhosis. Chronic. She continues to follow up with nephrology.  # follow up in 6 months/labs/AFP/ iron studies/ferritn/AFP.   Follow-up in 6 months/labs and studies.      Cammie Sickle, MD 12/21/2016 4:13 PM

## 2016-12-10 NOTE — Patient Instructions (Signed)
Recommend taking Bumex 1 mg twice a day for 3 days; call kidney doctors office for further instructions/labs.

## 2016-12-10 NOTE — Telephone Encounter (Signed)
noted 

## 2016-12-15 ENCOUNTER — Telehealth: Payer: Self-pay | Admitting: Gastroenterology

## 2016-12-15 NOTE — Telephone Encounter (Signed)
Patient LVM to call her. She wants to know if Dr. Allen Norris wants her to continue her medication. She didn't leave the medication name???

## 2016-12-16 NOTE — Telephone Encounter (Signed)
Pt stated she has been taking the Linzess samples you gave her but stated she doesn't feel like it is working very well. She has to eat with the medication to even have a bowel movement. She would like to know can she restart her lactulose. Please advise.

## 2016-12-17 NOTE — Telephone Encounter (Signed)
He has she can start her lactulose.

## 2016-12-18 NOTE — Telephone Encounter (Signed)
Contacted pt and left message with husband. She can stop Linzess and restart Lactulose.

## 2017-01-01 ENCOUNTER — Other Ambulatory Visit: Payer: Self-pay

## 2017-01-01 MED ORDER — LINACLOTIDE 290 MCG PO CAPS: 290.0000 ug | ORAL_CAPSULE | Freq: Every day | ORAL | 6 refills | Status: AC

## 2017-02-25 ENCOUNTER — Encounter: Payer: Self-pay | Admitting: *Deleted

## 2017-02-25 ENCOUNTER — Inpatient Hospital Stay: Payer: Medicare Other

## 2017-02-25 ENCOUNTER — Inpatient Hospital Stay: Payer: Medicare Other | Admitting: Internal Medicine

## 2017-02-25 NOTE — Progress Notes (Unsigned)
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   # COPD on 02; CHF/diastolic; CKD/Hyponaremia [Dr.Kolluru]    INTERVAL HISTORY:  62 year old female patient with a history of cirrhosis/splenomegaly of unclear etiology- is here for follow-up of pancytopenia/ review the results of the MRI of the liver that was ordered for elevated AFP.  Patient unfortunately did not get contrast with MRI because of elevated creatinine at 2.5. Patient stated that she had been recommended increased dose of diuretics given her fluid overload as per nephrology.    She has mild fatigue. Not any worse. Denies any blood in stools black stools. She continues to be on oral iron pills.    REVIEW OF SYSTEMS:  Otherwise no fevers or chills. Complete 10 point review of system is done which is negative except mentioned above in history of present illness .   Social Hx- denies any current smoking or alcohol.Patient has been recently discharged from rehabilitation.  I have reviewed the past medical history, past surgical history, social history and family history with the patient and they are unchanged from previous note unless stated above.  ALLERGIES:  is allergic to iodine; latex; other; oxycodone; oxycodone-acetaminophen; shellfish-derived products; amitriptyline; fish-derived products; tape;  wellbutrin [bupropion]; augmentin [amoxicillin-pot clavulanate]; codeine; cortisone; diphenhydramine; hydrocodone-acetaminophen; and vicodin [hydrocodone-acetaminophen].  MEDICATIONS:  Current Outpatient Medications  Medication Sig Dispense Refill  . albuterol (PROAIR HFA) 108 (90 Base) MCG/ACT inhaler ProAir HFA 90 mcg/actuation aerosol inhaler    . albuterol (PROVENTIL) (2.5 MG/3ML) 0.083% nebulizer solution Take 2.5 mg by nebulization every 6 (six) hours as needed for wheezing or shortness of breath.    . allopurinol (ZYLOPRIM) 100 MG tablet Take 100 mg by mouth daily.     . ARIPiprazole (ABILIFY) 5 MG tablet Take 5 mg by mouth at bedtime.     Marland Kitchen atorvastatin (LIPITOR) 10 MG tablet Take 10 mg by mouth at bedtime.     . budesonide-formoterol (SYMBICORT) 80-4.5 MCG/ACT inhaler Inhale 2 puffs into the lungs 2 (two) times daily.     . bumetanide (BUMEX) 1 MG tablet Take 2 mg by mouth 2 (two) times daily.     . Calcium Carbonate-Vitamin D (CALTRATE 600+D PO) Take 1 tablet by mouth daily.    . ferrous fumarate-iron polysaccharide complex (TANDEM) 162-115.2 MG CAPS capsule Take 1 capsule by mouth 2 (two) times daily after a meal. 60 capsule 11  . fluticasone (FLONASE) 50 MCG/ACT nasal spray Place 1-2 sprays into both nostrils daily as needed for rhinitis.    Marland Kitchen HYDROmorphone (DILAUDID) 2 MG tablet Take 1 tablet (2 mg total) by mouth every 6 (six) hours as needed for severe pain. (Patient taking differently: Take 2 mg by mouth every 8 (eight) hours as needed for severe pain. ) 30 tablet 0  . insulin glargine (LANTUS) 100 UNIT/ML injection Inject 30 Units into the skin every morning.     . lamoTRIgine (LAMICTAL) 150 MG tablet Take 150 mg by mouth  at bedtime.     Marland Kitchen levothyroxine (SYNTHROID, LEVOTHROID) 88 MCG tablet Take 88 mcg by mouth daily before breakfast.     . linaclotide (LINZESS) 290 MCG CAPS capsule Take 1 capsule (290 mcg total) daily before breakfast by mouth. 30 capsule 6  . magnesium oxide  (MAG-OX) 400 MG tablet Take 1 tablet by mouth daily.    . metoCLOPramide (REGLAN) 10 MG tablet Take 10 mg by mouth 4 (four) times daily as needed for nausea or vomiting.     . metoprolol (LOPRESSOR) 50 MG tablet Take 50 mg by mouth 2 (two) times daily.    . Multiple Vitamin (MULTIVITAMIN WITH MINERALS) TABS tablet Take 1 tablet by mouth daily.    . ondansetron (ZOFRAN) 4 MG tablet Take 1 tablet by mouth 2 (two) times daily as needed for nausea/vomiting.    . pantoprazole (PROTONIX) 40 MG tablet Take 40 mg by mouth daily.    . sertraline (ZOLOFT) 100 MG tablet Take 100 mg by mouth 2 (two) times daily.     Marland Kitchen spironolactone (ALDACTONE) 50 MG tablet Take 1 tablet (50 mg total) by mouth daily. 30 tablet 11   No current facility-administered medications for this visit.     PHYSICAL EXAMINATION:   LMP  (LMP Unknown)   There were no vitals filed for this visit.  GENERAL:alert, no distress and comfortable.Patient alone. Patient is wheelchair. Her left knee is bandaged. EYES: normal, Conjunctiva are pink and non-injected, sclera clear OROPHARYNX: Poor dentition ulceration  NECK: No thyromegaly LYMPH:  no palpable lymphadenopathy in the cervical, axillary or inguinal LUNGS: clear to auscultation with normal breathing effort; No Wheeze or crackles  Cardio-vascular- Regular Rate & Rythm and no murmurs and no lower extremity edema ABDOMEN:abdomen soft, non-tender and normal bowel sounds;positive for splenomegaly  Musculoskeletal:no cyanosis of digits and no clubbing  NEURO: alert & oriented x 3 with fluent speech, no focal motor/sensory deficits. SKIN:Multiple bruises noted on the anterior chest and bilateral upper extremities  LABORATORY DATA:  I have reviewed the data as listed    Component Value Date/Time   NA 125 (L) 12/10/2016 1450   NA 128 (A) 05/31/2014   NA 136 03/15/2014 1336   K 4.2 12/10/2016 1450   K 3.1 (L) 03/15/2014 1336   CL 87 (L) 12/10/2016 1450   CL 98 03/15/2014 1336    CO2 27 12/10/2016 1450   CO2 29 03/15/2014 1336   GLUCOSE 223 (H) 12/10/2016 1450   GLUCOSE 57 (L) 03/15/2014 1336   BUN 73 (H) 12/10/2016 1450   BUN 34 (A) 05/31/2014   BUN 8 03/15/2014 1336   CREATININE 2.42 (H) 12/10/2016 1450   CREATININE 1.05 03/15/2014 1336   CALCIUM 8.8 (L) 12/10/2016 1450   CALCIUM 7.7 (L) 03/15/2014 1336   PROT 6.7 12/10/2016 1450   PROT 5.9 (L) 03/15/2014 1336   ALBUMIN 3.2 (L) 12/10/2016 1450   ALBUMIN 2.1 (L) 03/15/2014 1336   AST 29 12/10/2016 1450   AST 40 (H) 03/15/2014 1336   ALT 15 12/10/2016 1450   ALT 13 (L) 03/15/2014 1336   ALKPHOS 142 (H) 12/10/2016 1450   ALKPHOS 221 (H) 03/15/2014 1336   BILITOT 0.6 12/10/2016 1450   BILITOT 0.4 03/15/2014 1336   GFRNONAA 20 (L) 12/10/2016 1450   GFRNONAA 57 (L) 03/15/2014 1336   GFRNONAA >60 03/06/2013 1855   GFRAA 23 (L) 12/10/2016 1450   GFRAA >60 03/15/2014 1336   GFRAA >60 03/06/2013 1855    No results found for:  SPEP, UPEP  Lab Results  Component Value Date   WBC 4.8 12/10/2016   NEUTROABS 3.7 12/10/2016   HGB 11.2 (L) 12/10/2016   HCT 33.1 (L) 12/10/2016   MCV 99.8 12/10/2016   PLT 112 (L) 12/10/2016      Chemistry      Component Value Date/Time   NA 125 (L) 12/10/2016 1450   NA 128 (A) 05/31/2014   NA 136 03/15/2014 1336   K 4.2 12/10/2016 1450   K 3.1 (L) 03/15/2014 1336   CL 87 (L) 12/10/2016 1450   CL 98 03/15/2014 1336   CO2 27 12/10/2016 1450   CO2 29 03/15/2014 1336   BUN 73 (H) 12/10/2016 1450   BUN 34 (A) 05/31/2014   BUN 8 03/15/2014 1336   CREATININE 2.42 (H) 12/10/2016 1450   CREATININE 1.05 03/15/2014 1336   GLU 120 05/31/2014      Component Value Date/Time   CALCIUM 8.8 (L) 12/10/2016 1450   CALCIUM 7.7 (L) 03/15/2014 1336   ALKPHOS 142 (H) 12/10/2016 1450   ALKPHOS 221 (H) 03/15/2014 1336   AST 29 12/10/2016 1450   AST 40 (H) 03/15/2014 1336   ALT 15 12/10/2016 1450   ALT 13 (L) 03/15/2014 1336   BILITOT 0.6 12/10/2016 1450   BILITOT 0.4 03/15/2014  1336       RADIOGRAPHIC STUDIES: I have personally reviewed the radiological images as listed and agreed with the findings in the report. No results found.   ASSESSMENT & PLAN:   No problem-specific Assessment & Plan notes found for this encounter.  Follow-up in 6 months/labs and studies.      Cammie Sickle, MD 02/25/2017 8:52 AM

## 2017-02-25 NOTE — Progress Notes (Signed)
Patient came for apt schedule today 02/25/17. Per Dr. Rogue Bussing - Apt not needed today. Pt last seen in October- Md requested to see patient in 6 months (April 2019). This apt in January was not cnl. Patient notified that she does not need to be evaluated today. Pt gave verbal understanding.

## 2017-02-25 NOTE — Assessment & Plan Note (Signed)
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 Discussed with Dr.Kolluru. Also instructed pt to call Dr.Koluru office.   # Sodium today is 126/secondary to cirrhosis. Chronic. She continues to follow up with nephrology.  # follow up in 6 months/labs/AFP/ iron studies/ferritn/AFP.

## 2017-04-20 ENCOUNTER — Other Ambulatory Visit: Payer: Self-pay

## 2017-04-20 ENCOUNTER — Emergency Department: Payer: Medicare Other

## 2017-04-20 ENCOUNTER — Encounter: Payer: Self-pay | Admitting: Emergency Medicine

## 2017-04-20 ENCOUNTER — Inpatient Hospital Stay
Admission: EM | Admit: 2017-04-20 | Discharge: 2017-05-24 | DRG: 469 | Disposition: E | Payer: Medicare Other | Attending: Internal Medicine | Admitting: Internal Medicine

## 2017-04-20 DIAGNOSIS — E876 Hypokalemia: Secondary | ICD-10-CM | POA: Diagnosis not present

## 2017-04-20 DIAGNOSIS — F429 Obsessive-compulsive disorder, unspecified: Secondary | ICD-10-CM | POA: Diagnosis present

## 2017-04-20 DIAGNOSIS — Z86010 Personal history of colonic polyps: Secondary | ICD-10-CM

## 2017-04-20 DIAGNOSIS — Z9981 Dependence on supplemental oxygen: Secondary | ICD-10-CM

## 2017-04-20 DIAGNOSIS — Z91013 Allergy to seafood: Secondary | ICD-10-CM

## 2017-04-20 DIAGNOSIS — L8990 Pressure ulcer of unspecified site, unspecified stage: Secondary | ICD-10-CM

## 2017-04-20 DIAGNOSIS — Z803 Family history of malignant neoplasm of breast: Secondary | ICD-10-CM

## 2017-04-20 DIAGNOSIS — K589 Irritable bowel syndrome without diarrhea: Secondary | ICD-10-CM | POA: Diagnosis present

## 2017-04-20 DIAGNOSIS — E782 Mixed hyperlipidemia: Secondary | ICD-10-CM | POA: Diagnosis present

## 2017-04-20 DIAGNOSIS — Z6826 Body mass index (BMI) 26.0-26.9, adult: Secondary | ICD-10-CM

## 2017-04-20 DIAGNOSIS — M545 Low back pain: Secondary | ICD-10-CM | POA: Diagnosis present

## 2017-04-20 DIAGNOSIS — S72012A Unspecified intracapsular fracture of left femur, initial encounter for closed fracture: Principal | ICD-10-CM | POA: Diagnosis present

## 2017-04-20 DIAGNOSIS — S72009A Fracture of unspecified part of neck of unspecified femur, initial encounter for closed fracture: Secondary | ICD-10-CM | POA: Diagnosis present

## 2017-04-20 DIAGNOSIS — Z9911 Dependence on respirator [ventilator] status: Secondary | ICD-10-CM | POA: Diagnosis not present

## 2017-04-20 DIAGNOSIS — I429 Cardiomyopathy, unspecified: Secondary | ICD-10-CM | POA: Diagnosis present

## 2017-04-20 DIAGNOSIS — J439 Emphysema, unspecified: Secondary | ICD-10-CM | POA: Diagnosis present

## 2017-04-20 DIAGNOSIS — R011 Cardiac murmur, unspecified: Secondary | ICD-10-CM | POA: Diagnosis present

## 2017-04-20 DIAGNOSIS — S72002A Fracture of unspecified part of neck of left femur, initial encounter for closed fracture: Secondary | ICD-10-CM

## 2017-04-20 DIAGNOSIS — I5043 Acute on chronic combined systolic (congestive) and diastolic (congestive) heart failure: Secondary | ICD-10-CM | POA: Diagnosis present

## 2017-04-20 DIAGNOSIS — N184 Chronic kidney disease, stage 4 (severe): Secondary | ICD-10-CM | POA: Diagnosis present

## 2017-04-20 DIAGNOSIS — K219 Gastro-esophageal reflux disease without esophagitis: Secondary | ICD-10-CM | POA: Diagnosis present

## 2017-04-20 DIAGNOSIS — N170 Acute kidney failure with tubular necrosis: Secondary | ICD-10-CM | POA: Diagnosis not present

## 2017-04-20 DIAGNOSIS — E039 Hypothyroidism, unspecified: Secondary | ICD-10-CM | POA: Diagnosis present

## 2017-04-20 DIAGNOSIS — I82409 Acute embolism and thrombosis of unspecified deep veins of unspecified lower extremity: Secondary | ICD-10-CM

## 2017-04-20 DIAGNOSIS — I639 Cerebral infarction, unspecified: Secondary | ICD-10-CM | POA: Diagnosis not present

## 2017-04-20 DIAGNOSIS — D649 Anemia, unspecified: Secondary | ICD-10-CM | POA: Diagnosis present

## 2017-04-20 DIAGNOSIS — J9601 Acute respiratory failure with hypoxia: Secondary | ICD-10-CM | POA: Diagnosis not present

## 2017-04-20 DIAGNOSIS — J810 Acute pulmonary edema: Secondary | ICD-10-CM

## 2017-04-20 DIAGNOSIS — E440 Moderate protein-calorie malnutrition: Secondary | ICD-10-CM

## 2017-04-20 DIAGNOSIS — Z886 Allergy status to analgesic agent status: Secondary | ICD-10-CM

## 2017-04-20 DIAGNOSIS — E1122 Type 2 diabetes mellitus with diabetic chronic kidney disease: Secondary | ICD-10-CM | POA: Diagnosis present

## 2017-04-20 DIAGNOSIS — K7460 Unspecified cirrhosis of liver: Secondary | ICD-10-CM | POA: Diagnosis present

## 2017-04-20 DIAGNOSIS — K7290 Hepatic failure, unspecified without coma: Secondary | ICD-10-CM | POA: Diagnosis not present

## 2017-04-20 DIAGNOSIS — Y95 Nosocomial condition: Secondary | ICD-10-CM | POA: Diagnosis not present

## 2017-04-20 DIAGNOSIS — R339 Retention of urine, unspecified: Secondary | ICD-10-CM | POA: Diagnosis not present

## 2017-04-20 DIAGNOSIS — Y92010 Kitchen of single-family (private) house as the place of occurrence of the external cause: Secondary | ICD-10-CM

## 2017-04-20 DIAGNOSIS — D509 Iron deficiency anemia, unspecified: Secondary | ICD-10-CM | POA: Diagnosis present

## 2017-04-20 DIAGNOSIS — R05 Cough: Secondary | ICD-10-CM

## 2017-04-20 DIAGNOSIS — M1990 Unspecified osteoarthritis, unspecified site: Secondary | ICD-10-CM | POA: Diagnosis present

## 2017-04-20 DIAGNOSIS — R402254 Coma scale, best verbal response, oriented, 24 hours or more after hospital admission: Secondary | ICD-10-CM | POA: Diagnosis present

## 2017-04-20 DIAGNOSIS — R34 Anuria and oliguria: Secondary | ICD-10-CM | POA: Diagnosis not present

## 2017-04-20 DIAGNOSIS — J189 Pneumonia, unspecified organism: Secondary | ICD-10-CM | POA: Diagnosis not present

## 2017-04-20 DIAGNOSIS — Z515 Encounter for palliative care: Secondary | ICD-10-CM | POA: Diagnosis not present

## 2017-04-20 DIAGNOSIS — J9621 Acute and chronic respiratory failure with hypoxia: Secondary | ICD-10-CM | POA: Diagnosis not present

## 2017-04-20 DIAGNOSIS — E870 Hyperosmolality and hypernatremia: Secondary | ICD-10-CM | POA: Diagnosis not present

## 2017-04-20 DIAGNOSIS — Z800 Family history of malignant neoplasm of digestive organs: Secondary | ICD-10-CM

## 2017-04-20 DIAGNOSIS — Z885 Allergy status to narcotic agent status: Secondary | ICD-10-CM

## 2017-04-20 DIAGNOSIS — Z888 Allergy status to other drugs, medicaments and biological substances status: Secondary | ICD-10-CM

## 2017-04-20 DIAGNOSIS — W19XXXA Unspecified fall, initial encounter: Secondary | ICD-10-CM

## 2017-04-20 DIAGNOSIS — J441 Chronic obstructive pulmonary disease with (acute) exacerbation: Secondary | ICD-10-CM

## 2017-04-20 DIAGNOSIS — I130 Hypertensive heart and chronic kidney disease with heart failure and stage 1 through stage 4 chronic kidney disease, or unspecified chronic kidney disease: Secondary | ICD-10-CM | POA: Diagnosis present

## 2017-04-20 DIAGNOSIS — Z91048 Other nonmedicinal substance allergy status: Secondary | ICD-10-CM

## 2017-04-20 DIAGNOSIS — R059 Cough, unspecified: Secondary | ICD-10-CM

## 2017-04-20 DIAGNOSIS — E872 Acidosis: Secondary | ICD-10-CM | POA: Diagnosis not present

## 2017-04-20 DIAGNOSIS — F4310 Post-traumatic stress disorder, unspecified: Secondary | ICD-10-CM | POA: Diagnosis present

## 2017-04-20 DIAGNOSIS — I351 Nonrheumatic aortic (valve) insufficiency: Secondary | ICD-10-CM | POA: Diagnosis not present

## 2017-04-20 DIAGNOSIS — G8929 Other chronic pain: Secondary | ICD-10-CM | POA: Diagnosis present

## 2017-04-20 DIAGNOSIS — R402144 Coma scale, eyes open, spontaneous, 24 hours or more after hospital admission: Secondary | ICD-10-CM | POA: Diagnosis present

## 2017-04-20 DIAGNOSIS — Z961 Presence of intraocular lens: Secondary | ICD-10-CM | POA: Diagnosis present

## 2017-04-20 DIAGNOSIS — L304 Erythema intertrigo: Secondary | ICD-10-CM | POA: Diagnosis present

## 2017-04-20 DIAGNOSIS — Z4659 Encounter for fitting and adjustment of other gastrointestinal appliance and device: Secondary | ICD-10-CM

## 2017-04-20 DIAGNOSIS — I2510 Atherosclerotic heart disease of native coronary artery without angina pectoris: Secondary | ICD-10-CM | POA: Diagnosis present

## 2017-04-20 DIAGNOSIS — F419 Anxiety disorder, unspecified: Secondary | ICD-10-CM | POA: Diagnosis not present

## 2017-04-20 DIAGNOSIS — Z9841 Cataract extraction status, right eye: Secondary | ICD-10-CM

## 2017-04-20 DIAGNOSIS — W0110XA Fall on same level from slipping, tripping and stumbling with subsequent striking against unspecified object, initial encounter: Secondary | ICD-10-CM | POA: Diagnosis present

## 2017-04-20 DIAGNOSIS — Z794 Long term (current) use of insulin: Secondary | ICD-10-CM

## 2017-04-20 DIAGNOSIS — J9600 Acute respiratory failure, unspecified whether with hypoxia or hypercapnia: Secondary | ICD-10-CM

## 2017-04-20 DIAGNOSIS — R609 Edema, unspecified: Secondary | ICD-10-CM

## 2017-04-20 DIAGNOSIS — E11649 Type 2 diabetes mellitus with hypoglycemia without coma: Secondary | ICD-10-CM | POA: Diagnosis not present

## 2017-04-20 DIAGNOSIS — Z9119 Patient's noncompliance with other medical treatment and regimen: Secondary | ICD-10-CM

## 2017-04-20 DIAGNOSIS — F17210 Nicotine dependence, cigarettes, uncomplicated: Secondary | ICD-10-CM | POA: Diagnosis present

## 2017-04-20 DIAGNOSIS — R0902 Hypoxemia: Secondary | ICD-10-CM

## 2017-04-20 DIAGNOSIS — H5350 Unspecified color vision deficiencies: Secondary | ICD-10-CM

## 2017-04-20 DIAGNOSIS — Z79890 Hormone replacement therapy (postmenopausal): Secondary | ICD-10-CM

## 2017-04-20 DIAGNOSIS — M109 Gout, unspecified: Secondary | ICD-10-CM | POA: Diagnosis present

## 2017-04-20 DIAGNOSIS — Z452 Encounter for adjustment and management of vascular access device: Secondary | ICD-10-CM

## 2017-04-20 DIAGNOSIS — G4730 Sleep apnea, unspecified: Secondary | ICD-10-CM | POA: Diagnosis present

## 2017-04-20 DIAGNOSIS — Z7189 Other specified counseling: Secondary | ICD-10-CM | POA: Diagnosis not present

## 2017-04-20 DIAGNOSIS — Z881 Allergy status to other antibiotic agents status: Secondary | ICD-10-CM

## 2017-04-20 DIAGNOSIS — Z9842 Cataract extraction status, left eye: Secondary | ICD-10-CM

## 2017-04-20 DIAGNOSIS — D631 Anemia in chronic kidney disease: Secondary | ICD-10-CM | POA: Diagnosis present

## 2017-04-20 DIAGNOSIS — Z91040 Latex allergy status: Secondary | ICD-10-CM

## 2017-04-20 DIAGNOSIS — Z978 Presence of other specified devices: Secondary | ICD-10-CM

## 2017-04-20 DIAGNOSIS — R402364 Coma scale, best motor response, obeys commands, 24 hours or more after hospital admission: Secondary | ICD-10-CM | POA: Diagnosis present

## 2017-04-20 DIAGNOSIS — E1151 Type 2 diabetes mellitus with diabetic peripheral angiopathy without gangrene: Secondary | ICD-10-CM | POA: Diagnosis present

## 2017-04-20 DIAGNOSIS — I959 Hypotension, unspecified: Secondary | ICD-10-CM | POA: Diagnosis not present

## 2017-04-20 DIAGNOSIS — O2230 Deep phlebothrombosis in pregnancy, unspecified trimester: Secondary | ICD-10-CM

## 2017-04-20 DIAGNOSIS — F329 Major depressive disorder, single episode, unspecified: Secondary | ICD-10-CM | POA: Diagnosis present

## 2017-04-20 LAB — GLUCOSE, CAPILLARY
Glucose-Capillary: 68 mg/dL (ref 65–99)
Glucose-Capillary: 70 mg/dL (ref 65–99)

## 2017-04-20 LAB — AMMONIA: Ammonia: 45 umol/L — ABNORMAL HIGH (ref 9–35)

## 2017-04-20 MED ORDER — MORPHINE SULFATE (PF) 4 MG/ML IV SOLN
4.0000 mg | INTRAVENOUS | Status: DC | PRN
  Administered 2017-04-21 (×2): 4 mg via INTRAVENOUS
  Filled 2017-04-20 (×2): qty 1

## 2017-04-20 MED ORDER — SODIUM CHLORIDE 0.9 % IV SOLN
INTRAVENOUS | Status: DC
  Administered 2017-04-21 – 2017-04-24 (×6): via INTRAVENOUS

## 2017-04-20 MED ORDER — INSULIN ASPART 100 UNIT/ML ~~LOC~~ SOLN
0.0000 [IU] | Freq: Three times a day (TID) | SUBCUTANEOUS | Status: DC
  Administered 2017-04-22: 3 [IU] via SUBCUTANEOUS
  Filled 2017-04-20: qty 1

## 2017-04-20 MED ORDER — INSULIN ASPART 100 UNIT/ML ~~LOC~~ SOLN: 0.0000 [IU] | Freq: Every day | SUBCUTANEOUS | Status: DC

## 2017-04-20 NOTE — ED Notes (Signed)
Pt glucose 68 , MD Veronese notified. Orange juice given , will recheck

## 2017-04-20 NOTE — ED Notes (Signed)
Pt stuck x2 with no success, IV does not draw back for blood draw. PT refuses to be stuck anymore at this time. Charge RN aware for lab to be asked.

## 2017-04-20 NOTE — ED Notes (Signed)
Blood glucose 68

## 2017-04-20 NOTE — H&P (Signed)
[facility] [place] NAME: Samantha Mcmillan    MR#:  [id number]  DATE OF BIRTH:  1955/09/27  DATE OF ADMISSION:  04/08/2017  PRIMARY CARE PHYSICIAN: Casilda Carls, MD   REQUESTING/REFERRING PHYSICIAN:   CHIEF COMPLAINT:   Chief Complaint  Patient presents with  . Hip Pain  . Fall    HISTORY OF PRESENT ILLNESS: Samantha Mcmillan  is a 62 y.o. female with a known history of chronic diastolic heart failure, cirrhosis of liver, bronchial asthma, COPD, GERD, hyperlipidemia, hypertension, irritable bowel syndrome, iron deficiency anemia, type 2 diabetes mellitus presented to the emergency room for fall.  Patient lost balance while she was trying to reach her walker and and fell down at home.  She landed on her left hip.  Patient has pain in the left hip which is aching in nature 7 out of 10 on a scale of 1-10.  Patient was worked up with hip x-rays which showed left subcapital fracture.  Case was discussed with orthopedic attending on call by emergency room physician who recommended hospitalist admission.  No complaints of any chest pain, shortness of breath.  Patient was also worked up with CT head which showed no acute abnormality.  PAST MEDICAL HISTORY:   Past Medical History:  Diagnosis Date  . Acute on chronic diastolic CHF (congestive heart failure) ([place]) 05/16/2014  . Anemia   . Anxiety   . Arthritis    "some in my feet" (05/04/2014)  . Asthma   . Chronic bronchitis ([place])    "get it pretty much q yr"   . Chronic lower back pain   . Chronic respiratory failure ([place])   . Cirrhosis of liver ([place])   . Cirrhosis of liver ([place])   . Colon polyp   . COPD (chronic obstructive pulmonary disease) ([place])   . Depression   . Family history of adverse reaction to anesthesia    "my sister doesn't wake up good when she's put deep to sleep"  . GERD (gastroesophageal reflux disease)   . Headache    "more than a couple times/wk" (05/04/2014)  .  Heart murmur   . High cholesterol   . History of blood transfusion    "related to my anemia"  . Hypertension   . IBS (irritable bowel syndrome)   . IDA (iron deficiency anemia) 08/14/2014  . Iron deficiency anemia   . OCD (obsessive compulsive disorder)   . On home oxygen therapy    "2L; 24/7" (05/04/2014)  . Panic attack   . Personal history of tobacco use, presenting hazards to health 06/21/2015  . Pneumonia "several times"  . PTSD (post-traumatic stress disorder)   . Sleep apnea    "can't tolerate CPAP" (05/04/2014)  . Type II diabetes mellitus ([place])     PAST SURGICAL HISTORY:  Past Surgical History:  Procedure Laterality Date  . BACK SURGERY    . CATARACT EXTRACTION W/ INTRAOCULAR LENS  IMPLANT, BILATERAL Bilateral 2005  . CESAREAN SECTION  1979  . COLONOSCOPY  2014  . COLONOSCOPY WITH PROPOFOL N/A 12/08/2016   Procedure: COLONOSCOPY WITH PROPOFOL;  Surgeon: Lucilla Lame, MD;  Location: [facility] ENDOSCOPY;  Service: Endoscopy;  Laterality: N/A;  . DILATION AND CURETTAGE OF UTERUS    . ESOPHAGOGASTRODUODENOSCOPY (EGD) WITH PROPOFOL N/A 12/08/2016   Procedure: ESOPHAGOGASTRODUODENOSCOPY (EGD) WITH PROPOFOL;  Surgeon: Lucilla Lame, MD;  Location: [facility] ENDOSCOPY;  Service: Endoscopy;  Laterality: N/A;  . INCISION AND DRAINAGE OF WOUND Right 2009   "  leg mauled  by dog"  . PERIPHERAL VASCULAR CATHETERIZATION N/A 08/16/2014   Procedure: PICC Line Insertion;  Surgeon: Algernon Huxley, MD;  Location: [facility];  Service: Cardiovascular;  Laterality: N/A;  . POSTERIOR FUSION CERVICAL SPINE  2015   "rebuilt 3 of my neck vertebrae"  . TUBAL LIGATION  1979  . UPPER GASTROINTESTINAL ENDOSCOPY      SOCIAL HISTORY:  Social History   Tobacco Use  . Smoking status: Current Every Day Smoker    Packs/day: 0.50    Years: 48.00    Pack years: 24.00    Types: Cigarettes  . Smokeless tobacco: Never Used  Substance Use Topics  . Alcohol use: No    Alcohol/week: 0.0 oz    FAMILY  HISTORY:  Family History  Problem Relation Age of Onset  . Cancer Father        pancreatic  . Cancer Brother   . Breast cancer Maternal Aunt     DRUG ALLERGIES:  Allergies  Allergen Reactions  . Iodine Shortness Of Breath  . Latex Anaphylaxis  . Other Rash, Other (See Comments), Shortness Of Breath, Anaphylaxis and Swelling    Pt states that she is allergic to Darvocet.   . Oxycodone Shortness Of Breath, Rash and Swelling    Other reaction(s): Other (See Comments) wheezing  . Oxycodone-Acetaminophen Other (See Comments), Rash, Swelling and Shortness Of Breath    wheezing wheezing  . Shellfish-Derived Products Anaphylaxis, Rash, Shortness Of Breath and Swelling    Throat closes.  . Amitriptyline Other (See Comments)    Other reaction(s): Other (See Comments) Mental changes Other reaction(s): Other (See Comments) Altered mental status, agitation Reaction:  Mental changes   . Fish-Derived Products Other (See Comments) and Swelling    Other reaction(s): Other (See Comments) "respiratory distress and wheezing" "respiratory distress and wheezing"  . Tape Other (See Comments)    Other reaction(s): Other (See Comments) Pulls skin off Pt states that it pulls her skin off.  Pt states that it pulls her skin off.   . Wellbutrin [Bupropion] Other (See Comments)    Other reaction(s): Other (See Comments) Mental changes Reaction:  Mental changes   . Augmentin [Amoxicillin-Pot Clavulanate] Rash and Other (See Comments)    Has patient had a PCN reaction causing immediate rash, facial/tongue/throat swelling, SOB or lightheadedness with hypotension: No Has patient had a PCN reaction causing severe rash involving mucus membranes or skin necrosis: No Has patient had a PCN reaction that required hospitalization No Has patient had a PCN reaction occurring within the last 10 years: No If all of the above answers are "NO", then may proceed with Cephalosporin use.  . Codeine Rash  . Cortisone  Rash  . Diphenhydramine Rash  . Hydrocodone-Acetaminophen Rash    dizzy  . Vicodin [Hydrocodone-Acetaminophen] Rash and Other (See Comments)    Reaction:  Dizziness      REVIEW OF SYSTEMS:   CONSTITUTIONAL: No fever, fatigue or weakness.  EYES: No blurred or double vision.  EARS, NOSE, AND THROAT: No tinnitus or ear pain.  RESPIRATORY: No cough, shortness of breath, wheezing or hemoptysis.  CARDIOVASCULAR: No chest pain, orthopnea, edema.  GASTROINTESTINAL: No nausea, vomiting, diarrhea or abdominal pain.  GENITOURINARY: No dysuria, hematuria.  ENDOCRINE: No polyuria, nocturia,  HEMATOLOGY: No anemia, easy bruising or bleeding SKIN: No rash or lesion. MUSCULOSKELETAL: Has hip pain NEUROLOGIC: No tingling, numbness, weakness.  PSYCHIATRY: No anxiety or depression.   MEDICATIONS AT HOME:  Prior to Admission medications  Medication Sig Start Date End Date Taking? Authorizing Provider  albuterol (PROAIR HFA) 108 (90 Base) MCG/ACT inhaler ProAir HFA 90 mcg/actuation aerosol inhaler   Yes [provider]  allopurinol (ZYLOPRIM) 100 MG tablet Take 100 mg by mouth daily.    Yes [provider]  ARIPiprazole (ABILIFY) 5 MG tablet Take 5 mg by mouth at bedtime.    Yes [provider]  atorvastatin (LIPITOR) 20 MG tablet Take 20 mg by mouth daily.   Yes [provider]  budesonide-formoterol (SYMBICORT) 80-4.5 MCG/ACT inhaler Inhale 2 puffs into the lungs 2 (two) times daily.    Yes [provider]  bumetanide (BUMEX) 1 MG tablet Take 2 mg by mouth 2 (two) times daily.  07/04/15  Yes [provider]  Calcium Carbonate-Vitamin D (CALTRATE 600+D PO) Take 1 tablet by mouth daily.   Yes [provider]  ferrous fumarate-iron polysaccharide complex (TANDEM) 162-115.2 MG CAPS capsule Take 1 capsule by mouth 2 (two) times daily after a meal. 12/10/16  Yes Cammie Sickle, MD  HYDROmorphone (DILAUDID) 2 MG tablet Take 1 tablet (2 mg  total) by mouth every 6 (six) hours as needed for severe pain. Patient taking differently: Take 2 mg by mouth every 8 (eight) hours as needed for severe pain.  07/09/15  Yes Mody, Ulice Bold, MD  insulin glargine (LANTUS) 100 UNIT/ML injection Inject 30 Units into the skin every morning.    Yes [provider]  ipratropium-albuterol (DUONEB) 0.5-2.5 (3) MG/3ML SOLN Take 3 mLs by nebulization every 4 (four) hours as needed.   Yes [provider]  lamoTRIgine (LAMICTAL) 150 MG tablet Take 150 mg by mouth at bedtime.    Yes [provider]  levothyroxine (SYNTHROID, LEVOTHROID) 88 MCG tablet Take 88 mcg by mouth daily before breakfast.    Yes [provider]  linaclotide (LINZESS) 290 MCG CAPS capsule Take 1 capsule (290 mcg total) daily before breakfast by mouth. 01/01/17  Yes Lucilla Lame, MD  lisinopril (PRINIVIL,ZESTRIL) 20 MG tablet Take 20 mg by mouth daily.   Yes [provider]  magnesium oxide (MAG-OX) 400 MG tablet Take 1 tablet by mouth 2 (two) times daily.    Yes [provider]  metolazone (ZAROXOLYN) 5 MG tablet Take 5 mg by mouth daily.   Yes [provider]  metoprolol (LOPRESSOR) 50 MG tablet Take 50 mg by mouth 2 (two) times daily.   Yes [provider]  Multiple Vitamin (MULTIVITAMIN WITH MINERALS) TABS tablet Take 1 tablet by mouth daily. 05/18/14  Yes Rama, Venetia Maxon, MD  pantoprazole (PROTONIX) 40 MG tablet Take 40 mg by mouth daily.   Yes [provider]  sertraline (ZOLOFT) 100 MG tablet Take 200 mg by mouth daily.    Yes [provider]  spironolactone (ALDACTONE) 25 MG tablet Take 25 mg by mouth daily.   Yes [provider]  albuterol (PROVENTIL) (2.5 MG/3ML) 0.083% nebulizer solution Take 2.5 mg by nebulization every 6 (six) hours as needed for wheezing or shortness of breath.    [provider]  spironolactone (ALDACTONE) 50 MG tablet Take 1 tablet (50 mg total) by mouth  daily. 03/07/16 03/07/17  Earleen Newport, MD      PHYSICAL EXAMINATION:   VITAL SIGNS: Blood pressure 123/61, pulse 81, temperature 97.7 F (36.5 C), temperature source Oral, resp. rate 16, height 5\' 4"  (1.626 m), weight 70.3 kg (155 lb), SpO2 95 %.  GENERAL:  62 y.o.-year-old patient lying in the bed  with no acute distress.  EYES: Pupils equal, round, reactive to light and accommodation. No scleral icterus. Extraocular muscles intact.  HEENT: Head atraumatic, normocephalic. Oropharynx and nasopharynx clear.  NECK:  Supple, no jugular venous distention. No thyroid enlargement, no tenderness.  LUNGS: Normal breath sounds bilaterally, no wheezing, rales,rhonchi or crepitation. No use of accessory muscles of respiration.  CARDIOVASCULAR: S1, S2 normal. No murmurs, rubs, or gallops.  ABDOMEN: Soft, nontender, nondistended. Bowel sounds present. No organomegaly or mass.  EXTREMITIES: No pedal edema, cyanosis, or clubbing.  Tenderness left hip NEUROLOGIC: Cranial nerves II through XII are intact. Muscle strength 5/5 in all extremities. Sensation intact. Gait not checked.  PSYCHIATRIC: The patient is alert and oriented x 3.  SKIN: No obvious rash, lesion, or ulcer.   LABORATORY PANEL:   CBC No results for input(s): WBC, HGB, HCT, PLT, MCV, MCH, MCHC, RDW, LYMPHSABS, MONOABS, EOSABS, BASOSABS, BANDABS in the last 168 hours.  Invalid input(s): NEUTRABS, BANDSABD ------------------------------------------------------------------------------------------------------------------  Chemistries  No results for input(s): NA, K, CL, CO2, GLUCOSE, BUN, CREATININE, CALCIUM, MG, AST, ALT, ALKPHOS, BILITOT in the last 168 hours.  Invalid input(s): GFRCGP ------------------------------------------------------------------------------------------------------------------ CrCl cannot be calculated (Patient's most recent lab result is older than the maximum 21 days  allowed.). ------------------------------------------------------------------------------------------------------------------ No results for input(s): TSH, T4TOTAL, T3FREE, THYROIDAB in the last 72 hours.  Invalid input(s): FREET3   Coagulation profile No results for input(s): INR, PROTIME in the last 168 hours. ------------------------------------------------------------------------------------------------------------------- No results for input(s): DDIMER in the last 72 hours. -------------------------------------------------------------------------------------------------------------------  Cardiac Enzymes No results for input(s): CKMB, TROPONINI, MYOGLOBIN in the last 168 hours.  Invalid input(s): CK ------------------------------------------------------------------------------------------------------------------ Invalid input(s): POCBNP  ---------------------------------------------------------------------------------------------------------------  Urinalysis    Component Value Date/Time   COLORURINE YELLOW (A) 03/07/2016 1832   APPEARANCEUR CLEAR (A) 03/07/2016 1832   APPEARANCEUR Clear 03/15/2014 1658   LABSPEC 1.009 03/07/2016 1832   LABSPEC 1.005 03/15/2014 1658   PHURINE 6.0 03/07/2016 1832   GLUCOSEU 50 (A) 03/07/2016 1832   GLUCOSEU Negative 03/15/2014 1658   HGBUR MODERATE (A) 03/07/2016 1832   BILIRUBINUR NEGATIVE 03/07/2016 1832   BILIRUBINUR Negative 03/15/2014 1658   KETONESUR NEGATIVE 03/07/2016 1832   PROTEINUR 100 (A) 03/07/2016 1832   UROBILINOGEN 0.2 05/15/2014 1608   NITRITE NEGATIVE 03/07/2016 1832   LEUKOCYTESUR NEGATIVE 03/07/2016 1832   LEUKOCYTESUR 2+ 03/15/2014 1658     RADIOLOGY: Dg Chest 1 View  Result Date: 04/15/2017 CLINICAL DATA:  61 year old female with fall and left hip pain. Preop evaluation radiograph. EXAM: CHEST 1 VIEW COMPARISON:  Chest radiograph dated 12/27/2015 FINDINGS: The lungs are clear. There is no pleural effusion or  pneumothorax. The cardiac silhouette is within normal limits. Partially visualized lower cervical fusion hardware. No acute osseous pathology. IMPRESSION: No active disease. Electronically Signed   By: Anner Crete M.D.   On: 03/31/2017 21:14   Ct Head Wo Contrast  Result Date: 04/10/2017 CLINICAL DATA:  Fall. EXAM: CT HEAD WITHOUT CONTRAST CT CERVICAL SPINE WITHOUT CONTRAST TECHNIQUE: Multidetector CT imaging of the head and cervical spine was performed following the standard protocol without intravenous contrast. Multiplanar CT image reconstructions of the cervical spine were also generated. COMPARISON:  10/31/2015 FINDINGS: CT HEAD FINDINGS Brain: There is atrophy and chronic small vessel disease changes. No acute intracranial abnormality. Specifically, no hemorrhage, hydrocephalus, mass lesion, acute infarction, or significant intracranial injury. Vascular: No hyperdense vessel or unexpected calcification. Skull: No acute calvarial abnormality. Sinuses/Orbits: Visualized paranasal sinuses and mastoids clear. Orbital soft tissues unremarkable. Other: None CT CERVICAL SPINE FINDINGS  Alignment: Normal Skull base and vertebrae: No fracture. Prior posterior fusion from C3-C6. 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 Compression fracture involving the C7 vertebral body is again noted as seen on prior study from 2017, slightly progressed in the degree of collapse. No acute fracture. Soft tissues and spinal canal: Prevertebral soft tissues are normal. No epidural or paraspinal hematoma. Disc levels: Congenital fusion across C5-6. Remainder the disc spaces are maintained. Upper chest: Emphysema and biapical scarring. Other: Carotid artery calcifications.  No acute findings. IMPRESSION: No acute intracranial abnormality. Atrophy, chronic microvascular disease. 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 Posterior fusion changes from C3-C6. No acute bony abnormality. Old C7 compression fracture, progressed since 2017. Electronically Signed   By:  Rolm Baptise M.D.   On: 04/13/2017 21:15   Ct Cervical Spine Wo Contrast  Result Date: 04/14/2017 CLINICAL DATA:  Fall. EXAM: CT HEAD WITHOUT CONTRAST CT CERVICAL SPINE WITHOUT CONTRAST TECHNIQUE: Multidetector CT imaging of the head and cervical spine was performed following the standard protocol without intravenous contrast. Multiplanar CT image reconstructions of the cervical spine were also generated. COMPARISON:  10/31/2015 FINDINGS: CT HEAD FINDINGS Brain: There is atrophy and chronic small vessel disease changes. No acute intracranial abnormality. Specifically, no hemorrhage, hydrocephalus, mass lesion, acute infarction, or significant intracranial injury. Vascular: No hyperdense vessel or unexpected calcification. Skull: No acute calvarial abnormality. Sinuses/Orbits: Visualized paranasal sinuses and mastoids clear. Orbital soft tissues unremarkable. Other: None CT CERVICAL SPINE FINDINGS Alignment: Normal Skull base and vertebrae: No fracture. Prior posterior fusion from C3-C6. 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 Compression fracture involving the C7 vertebral body is again noted as seen on prior study from 2017, slightly progressed in the degree of collapse. No acute fracture. Soft tissues and spinal canal: Prevertebral soft tissues are normal. No epidural or paraspinal hematoma. Disc levels: Congenital fusion across C5-6. Remainder the disc spaces are maintained. Upper chest: Emphysema and biapical scarring. Other: Carotid artery calcifications.  No acute findings. IMPRESSION: No acute intracranial abnormality. Atrophy, chronic microvascular disease. 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 Posterior fusion changes from C3-C6. No acute bony abnormality. Old C7 compression fracture, progressed since 2017. Electronically Signed   By: Rolm Baptise M.D.   On: 03/31/2017 21:15   Dg Hip Unilat W Or Wo Pelvis 2-3 Views Left  Result Date: 04/13/2017 CLINICAL DATA:  The patient suffered a fall today. Left hip pain. Initial  encounter. EXAM: DG HIP (WITH OR WITHOUT PELVIS) 2-3V LEFT COMPARISON:  None. FINDINGS: The patient has an acute subcapital fracture of the left hip. No other acute abnormality is identified. IMPRESSION: Acute subcapital fracture left hip. Electronically Signed   By: Inge Rise M.D.   On: 04/14/2017 21:11    EKG: Orders placed or performed during the hospital encounter of 04/19/2017  . EKG 12-Lead  . EKG 12-Lead  . ED EKG  . ED EKG    IMPRESSION AND PLAN: 62 year old female patient with history of chronic diastolic heart failure, COPD, type 2 diabetes mellitus, cirrhosis of liver, chronic respiratory failure presented to the emergency room for fall and hip pain.  Admitting diagnosis 1.  Left hip fracture 2.  Accidental fall 3.  Cirrhosis of liver 4, diastolic heart failure 5.  Diabetes mellitus type 2 6.  Emphysema Treatment plan admit patient to medical floor with cardiac monitoring Orthopedic surgery consultation Check ammonia level Check PT and INR Keep patient n.p.o. DVT prophylaxis sequential compression device to lower extremities Resume cardiac medication Cycle troponin and cardiology consultation Gentle IV fluids All the records are reviewed and  case discussed with ED provider. Management plans discussed with the patient, family and they are in agreement.  CODE STATUS: Full code Code Status History    Date Active Date Inactive Code Status Order ID Comments User Context   12/27/2015 23:03 12/30/2015 17:05 Full Code [id number]  Lance Coon, MD Inpatient   07/06/2015 19:57 07/09/2015 17:14 Full Code [id number]  Idelle Crouch, MD Inpatient   04/16/2015 06:01 04/17/2015 17:27 Full Code [id number]  Saundra Shelling, MD Inpatient   09/06/2014 16:01 09/08/2014 14:39 Full Code [id number]  Demetrios Loll, MD Inpatient   08/26/2014 22:01 08/29/2014 15:33 Full Code [id number]  Lytle Butte, MD ED   07/07/2014 01:38 07/09/2014 15:57 Full Code [id number]  Juluis Mire, MD Inpatient    05/15/2014 22:45 05/18/2014 18:07 Full Code [id number]  Allyne Gee, MD Inpatient   05/04/2014 16:03 05/09/2014 16:55 Full Code [id number]  Rigoberto Noel, MD ED       TOTAL TIME TAKING CARE OF THIS PATIENT: 54 minutes.    Saundra Shelling M.D on 03/26/2017 at 10:00 PM  Between 7am to 6pm - Pager - [phone]  After 6pm go to [email or web address] - password [email or web address] [facility]  Office  [phone]  CC: Primary care physician; Casilda Carls, MD

## 2017-04-20 NOTE — ED Notes (Signed)
Patient given orange juice by this EDT.

## 2017-04-20 NOTE — ED Triage Notes (Signed)
Pt to ED via EMS from home with c/o mechanical fall, pt c/o LFT hip pain with + rotation and shortening. PT denies any LOC or head injury. Pt A&OX4. Per EMS gave 8mg  of morphine in route. MD at bedside

## 2017-04-20 NOTE — ED Notes (Addendum)
Patient transported to room 151 by this EDT.

## 2017-04-20 NOTE — ED Provider Notes (Signed)
[facility] Emergency Department Provider Note  ____________________________________________  Time seen: Approximately 8:33 PM  I have reviewed the triage vital signs and the nursing notes.   HISTORY  Chief Complaint Hip Pain and Fall   HPI Samantha Mcmillan is a 62 y.o. female with a history of cirrhosis of the liver, anemia, CHF, COPD, DM, hypertensionwho presents for evaluation of left hip pain status post mechanical fall. Patient reports that she had just finished washing the dishes and was walking from the kitchen to the living room when her foot got caught and she fell onto the left hip. She reports hitting the back of her head on the ground. She is not on blood thinners. She did not pass out. She was complaining of severe sharp pain located in the left hip worse with any movement or palpation that started immediately after the fall and nonradiating. Patient received a total of 8 mg of IV morphine per EMS with improvement of her pain. At this time she has mild pain. She denies back pain, headache, neck pain, chest pain, shortness of breath, abdominal pain.   Past Medical History:  Diagnosis Date  . Acute on chronic diastolic CHF (congestive heart failure) ([place]) 05/16/2014  . Anemia   . Anxiety   . Arthritis    "some in my feet" (05/04/2014)  . Asthma   . Chronic bronchitis ([place])    "get it pretty much q yr"   . Chronic lower back pain   . Chronic respiratory failure ([place])   . Cirrhosis of liver ([place])   . Cirrhosis of liver ([place])   . Colon polyp   . COPD (chronic obstructive pulmonary disease) ([place])   . Depression   . Family history of adverse reaction to anesthesia    "my sister doesn't wake up good when she's put deep to sleep"  . GERD (gastroesophageal reflux disease)   . Headache    "more than a couple times/wk" (05/04/2014)  . Heart murmur   . High cholesterol   . History of blood transfusion    "related to my anemia"  . Hypertension   .  IBS (irritable bowel syndrome)   . IDA (iron deficiency anemia) 08/14/2014  . Iron deficiency anemia   . OCD (obsessive compulsive disorder)   . On home oxygen therapy    "2L; 24/7" (05/04/2014)  . Panic attack   . Personal history of tobacco use, presenting hazards to health 06/21/2015  . Pneumonia "several times"  . PTSD (post-traumatic stress disorder)   . Sleep apnea    "can't tolerate CPAP" (05/04/2014)  . Type II diabetes mellitus [facility])     Patient Active Problem List   Diagnosis Date Noted  . Hip fracture ([place]) 04/10/2017  . History of colonic polyps   . Benign neoplasm of transverse colon   . Benign neoplasm of descending colon   . Gastritis without bleeding   . Venous stasis 08/10/2016  . Hypokalemia 12/27/2015  . Acute on chronic diastolic heart failure ([place]) 12/27/2015  . Abdominal pain 11/13/2015  . Chronic pain 11/13/2015  . Closed nondisplaced fracture of seventh cervical vertebra with routine healing 11/13/2015  . Essential hypertension 11/13/2015  . Gallstone pancreatitis 11/13/2015  . Jaundice 11/13/2015  . Closed nondisplaced fracture of seventh cervical vertebra ([place]) 11/01/2015  . ARF (acute renal failure) ([place]) 07/06/2015  . Hyperkalemia 07/06/2015  . Dehydration 07/06/2015  . Personal history of tobacco use, presenting hazards to health 06/21/2015  . Bilateral carotid  artery stenosis 06/04/2015  . Mixed hyperlipidemia 06/04/2015  . Fall 04/16/2015  . Bacterial skin infection of leg 10/24/2014  . Edema leg 10/08/2014  . Hypertensive pulmonary vascular disease ([place]) 10/08/2014  . Mixed obsessional thoughts and acts 09/27/2014  . Chronic diastolic heart failure ([place]) 09/21/2014  . Tobacco use 09/21/2014  . Thrombocytopenia ([place]) 09/19/2014  . Leukopenia 09/19/2014  . Stasis dermatitis of left lower extremity with venous ulcer due to chronic peripheral venous hypertension ([place]) 08/15/2014  . IDA (iron deficiency anemia) 08/14/2014  . Ascites 07/25/2014    . Cellulitis and abscess of lower extremity 07/07/2014  . Encephalopathy, hepatic ([place]) 05/18/2014  . Polypharmacy 05/18/2014  . Failure to thrive in adult 05/16/2014  . Hepatic cirrhosis ([place]) 05/16/2014  . Protein-calorie malnutrition, severe ([place]) 05/16/2014  . Advanced COPD ([place]) 05/16/2014  . Acute on chronic diastolic CHF (congestive heart failure) ([place]) 05/16/2014  . Chronic back pain 05/16/2014  . Weakness 05/15/2014  . Hyponatremia 05/15/2014  . HCAP (healthcare-associated pneumonia) 05/09/2014  . Urinary retention 05/09/2014  . DM2 (diabetes mellitus, type 2) ([place]) 05/09/2014  . Hypothyroidism 05/05/2014  . Cervical stenosis of spine 03/11/2013  . Major depressive disorder, recurrent episode, severe ([place]) 12/05/2012  . Posttraumatic stress disorder 12/05/2012    Past Surgical History:  Procedure Laterality Date  . BACK SURGERY    . CATARACT EXTRACTION W/ INTRAOCULAR LENS  IMPLANT, BILATERAL Bilateral 2005  . CESAREAN SECTION  1979  . COLONOSCOPY  2014  . COLONOSCOPY WITH PROPOFOL N/A 12/08/2016   Procedure: COLONOSCOPY WITH PROPOFOL;  Surgeon: Lucilla Lame, MD;  Location: [facility] ENDOSCOPY;  Service: Endoscopy;  Laterality: N/A;  . DILATION AND CURETTAGE OF UTERUS    . ESOPHAGOGASTRODUODENOSCOPY (EGD) WITH PROPOFOL N/A 12/08/2016   Procedure: ESOPHAGOGASTRODUODENOSCOPY (EGD) WITH PROPOFOL;  Surgeon: Lucilla Lame, MD;  Location: [facility] ENDOSCOPY;  Service: Endoscopy;  Laterality: N/A;  . INCISION AND DRAINAGE OF WOUND Right 2009   "leg mauled  by dog"  . PERIPHERAL VASCULAR CATHETERIZATION N/A 08/16/2014   Procedure: PICC Line Insertion;  Surgeon: Algernon Huxley, MD;  Location: [facility] CV LAB;  Service: Cardiovascular;  Laterality: N/A;  . POSTERIOR FUSION CERVICAL SPINE  2015   "rebuilt 3 of my neck vertebrae"  . TUBAL LIGATION  1979  . UPPER GASTROINTESTINAL ENDOSCOPY      Prior to Admission medications   Medication Sig Start Date End Date Taking? Authorizing  Provider  albuterol (PROAIR HFA) 108 (90 Base) MCG/ACT inhaler ProAir HFA 90 mcg/actuation aerosol inhaler   Yes [provider]  allopurinol (ZYLOPRIM) 100 MG tablet Take 100 mg by mouth daily.    Yes [provider]  ARIPiprazole (ABILIFY) 5 MG tablet Take 5 mg by mouth at bedtime.    Yes [provider]  atorvastatin (LIPITOR) 20 MG tablet Take 20 mg by mouth daily.   Yes [provider]  budesonide-formoterol (SYMBICORT) 80-4.5 MCG/ACT inhaler Inhale 2 puffs into the lungs 2 (two) times daily.    Yes [provider]  bumetanide (BUMEX) 1 MG tablet Take 2 mg by mouth 2 (two) times daily.  07/04/15  Yes [provider]  Calcium Carbonate-Vitamin D (CALTRATE 600+D PO) Take 1 tablet by mouth daily.   Yes [provider]  ferrous fumarate-iron polysaccharide complex (TANDEM) 162-115.2 MG CAPS capsule Take 1 capsule by mouth 2 (two) times daily after a meal. 12/10/16  Yes Cammie Sickle, MD  HYDROmorphone (DILAUDID) 2 MG tablet Take 1 tablet (2 mg total) by mouth  every 6 (six) hours as needed for severe pain. Patient taking differently: Take 2 mg by mouth every 8 (eight) hours as needed for severe pain.  07/09/15  Yes Mody, Ulice Bold, MD  insulin glargine (LANTUS) 100 UNIT/ML injection Inject 30 Units into the skin every morning.    Yes [provider]  ipratropium-albuterol (DUONEB) 0.5-2.5 (3) MG/3ML SOLN Take 3 mLs by nebulization every 4 (four) hours as needed.   Yes [provider]  lamoTRIgine (LAMICTAL) 150 MG tablet Take 150 mg by mouth at bedtime.    Yes [provider]  levothyroxine (SYNTHROID, LEVOTHROID) 88 MCG tablet Take 88 mcg by mouth daily before breakfast.    Yes [provider]  linaclotide (LINZESS) 290 MCG CAPS capsule Take 1 capsule (290 mcg total) daily before breakfast by mouth. 01/01/17  Yes Lucilla Lame, MD  lisinopril (PRINIVIL,ZESTRIL) 20 MG tablet Take 20 mg by mouth daily.    Yes [provider]  magnesium oxide (MAG-OX) 400 MG tablet Take 1 tablet by mouth 2 (two) times daily.    Yes [provider]  metolazone (ZAROXOLYN) 5 MG tablet Take 5 mg by mouth daily.   Yes [provider]  metoprolol (LOPRESSOR) 50 MG tablet Take 50 mg by mouth 2 (two) times daily.   Yes [provider]  Multiple Vitamin (MULTIVITAMIN WITH MINERALS) TABS tablet Take 1 tablet by mouth daily. 05/18/14  Yes Rama, Venetia Maxon, MD  pantoprazole (PROTONIX) 40 MG tablet Take 40 mg by mouth daily.   Yes [provider]  sertraline (ZOLOFT) 100 MG tablet Take 200 mg by mouth daily.    Yes [provider]  spironolactone (ALDACTONE) 25 MG tablet Take 25 mg by mouth daily.   Yes [provider]  albuterol (PROVENTIL) (2.5 MG/3ML) 0.083% nebulizer solution Take 2.5 mg by nebulization every 6 (six) hours as needed for wheezing or shortness of breath.    [provider]  spironolactone (ALDACTONE) 50 MG tablet Take 1 tablet (50 mg total) by mouth daily. 03/07/16 03/07/17  Earleen Newport, MD    Allergies Iodine; Latex; Other; Oxycodone; Oxycodone-acetaminophen; Shellfish-derived products; Amitriptyline; Fish-derived products; Tape; Wellbutrin [bupropion]; Augmentin [amoxicillin-pot clavulanate]; Codeine; Cortisone; Diphenhydramine; Hydrocodone-acetaminophen; and Vicodin [hydrocodone-acetaminophen]  Family History  Problem Relation Age of Onset  . Cancer Father        pancreatic  . Cancer Brother   . Breast cancer Maternal Aunt     Social History Social History   Tobacco Use  . Smoking status: Current Every Day Smoker    Packs/day: 0.50    Years: 48.00    Pack years: 24.00    Types: Cigarettes  . Smokeless tobacco: Never Used  Substance Use Topics  . Alcohol use: No    Alcohol/week: 0.0 oz  . Drug use: No    Review of Systems Constitutional: Negative for fever. Eyes: Negative for visual changes. ENT: Negative  for facial injury or neck injury Cardiovascular: Negative for chest injury. Respiratory: Negative for shortness of breath. Negative for chest wall injury. Gastrointestinal: Negative for abdominal pain or injury. Genitourinary: Negative for dysuria. Musculoskeletal: Negative for back injury, + L hip pain Skin: Negative for laceration/abrasions. Neurological: + head injury.   ____________________________________________   PHYSICAL EXAM:  VITAL SIGNS: ED Triage Vitals  Enc Vitals Group     BP --      Pulse Rate 03/30/2017 2027 81     Resp 04/10/2017 2027 16     Temp 04/05/2017 2027 97.7 F (36.5  C)     Temp Source 04/21/2017 2027 Oral     SpO2 04/14/2017 2027 95 %     Weight 03/30/2017 2030 155 lb (70.3 kg)     Height 04/04/2017 2030 5\' 4"  (1.626 m)     Head Circumference --      Peak Flow --      Pain Score 04/11/2017 2029 6     Pain Loc --      Pain Edu? --      Excl. in [place]? --    Full spinal precautions maintained throughout the trauma exam. Constitutional: Alert and oriented. No acute distress. Does not appear intoxicated. HEENT Head: Normocephalic and atraumatic. Face: No facial bony tenderness. Stable midface Ears: No hemotympanum bilaterally. No Battle sign Eyes: No eye injury. PERRL. No raccoon eyes Nose: Nontender. No epistaxis. No rhinorrhea Mouth/Throat: Mucous membranes are moist. No oropharyngeal blood. No dental injury. Airway patent without stridor. Normal voice. Neck: no C-collar in place. No midline c-spine tenderness.  Cardiovascular: Normal rate, regular rhythm. Normal and symmetric distal pulses are present in all extremities. Pulmonary/Chest: Chest wall is stable and nontender to palpation/compression. Normal respiratory effort. Breath sounds are normal. No crepitus.  Abdominal: Soft, nontender, non distended. Musculoskeletal: Tenderness to palpation over the left lateral proximal femur, left lower extremity is shortened and externally rotated.  Nontender with normal  full range of motion in all other extremities. No deformities. No thoracic or lumbar midline spinal tenderness. Pelvis is stable. Skin: Skin is warm, dry and intact. No abrasions or contutions. Psychiatric: Speech and behavior are appropriate. Neurological: Normal speech and language. Moves all extremities to command. No gross focal neurologic deficits are appreciated.  Glascow Coma Score: 4 - Opens eyes on own 6 - Follows simple motor commands 5 - Alert and oriented GCS: 15   ____________________________________________   LABS (all labs ordered are listed, but only abnormal results are displayed)  Labs Reviewed  AMMONIA - Abnormal; Notable for the following components:      Result Value   Ammonia 45 (*)    All other components within normal limits  GLUCOSE, CAPILLARY  GLUCOSE, CAPILLARY  PROTIME-INR  TROPONIN I  TROPONIN I  TROPONIN I  CBC  COMPREHENSIVE METABOLIC PANEL  TYPE AND SCREEN   ____________________________________________  EKG  ED ECG REPORT I, Rudene Re, the attending physician, personally viewed and interpreted this ECG.  Normal sinus rhythm, rate of 83, normal PR and QRS intervals, prolonged QTC, normal axis, no ST elevations or depressions. Unchanged from prior from November 2017  ____________________________________________  RADIOLOGY  I have personally reviewed the images performed during this visit and I agree with the Radiologist's read.   Interpretation by Radiologist:  Dg Chest 1 View  Result Date: 03/31/2017 CLINICAL DATA:  62 year old female with fall and left hip pain. Preop evaluation radiograph. EXAM: CHEST 1 VIEW COMPARISON:  Chest radiograph dated 12/27/2015 FINDINGS: The lungs are clear. There is no pleural effusion or pneumothorax. The cardiac silhouette is within normal limits. Partially visualized lower cervical fusion hardware. No acute osseous pathology. IMPRESSION: No active disease. Electronically Signed   By: Anner Crete M.D.   On: 04/16/2017 21:14   Ct Head Wo Contrast  Result Date: 04/03/2017 CLINICAL DATA:  Fall. EXAM: CT HEAD WITHOUT CONTRAST CT CERVICAL SPINE WITHOUT CONTRAST TECHNIQUE: Multidetector CT imaging of the head and cervical spine was performed following the standard protocol without intravenous contrast. Multiplanar CT image reconstructions of the cervical spine were also generated. COMPARISON:  10/31/2015 FINDINGS: CT HEAD FINDINGS Brain: There is atrophy and chronic small vessel disease changes. No acute intracranial abnormality. Specifically, no hemorrhage, hydrocephalus, mass lesion, acute infarction, or significant intracranial injury. Vascular: No hyperdense vessel or unexpected calcification. Skull: No acute calvarial abnormality. Sinuses/Orbits: Visualized paranasal sinuses and mastoids clear. Orbital soft tissues unremarkable. Other: None CT CERVICAL SPINE FINDINGS Alignment: Normal Skull base and vertebrae: No fracture. Prior posterior fusion from C3-C6. 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 Compression fracture involving the C7 vertebral body is again noted as seen on prior study from 2017, slightly progressed in the degree of collapse. No acute fracture. Soft tissues and spinal canal: Prevertebral soft tissues are normal. No epidural or paraspinal hematoma. Disc levels: Congenital fusion across C5-6. Remainder the disc spaces are maintained. Upper chest: Emphysema and biapical scarring. Other: Carotid artery calcifications.  No acute findings. IMPRESSION: No acute intracranial abnormality. Atrophy, chronic microvascular disease. 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 Posterior fusion changes from C3-C6. No acute bony abnormality. Old C7 compression fracture, progressed since 2017. Electronically Signed   By: Rolm Baptise M.D.   On: 04/11/2017 21:15   Ct Cervical Spine Wo Contrast  Result Date: 03/26/2017 CLINICAL DATA:  Fall. EXAM: CT HEAD WITHOUT CONTRAST CT CERVICAL SPINE WITHOUT CONTRAST TECHNIQUE:  Multidetector CT imaging of the head and cervical spine was performed following the standard protocol without intravenous contrast. Multiplanar CT image reconstructions of the cervical spine were also generated. COMPARISON:  10/31/2015 FINDINGS: CT HEAD FINDINGS Brain: There is atrophy and chronic small vessel disease changes. No acute intracranial abnormality. Specifically, no hemorrhage, hydrocephalus, mass lesion, acute infarction, or significant intracranial injury. Vascular: No hyperdense vessel or unexpected calcification. Skull: No acute calvarial abnormality. Sinuses/Orbits: Visualized paranasal sinuses and mastoids clear. Orbital soft tissues unremarkable. Other: None CT CERVICAL SPINE FINDINGS Alignment: Normal Skull base and vertebrae: No fracture. Prior posterior fusion from C3-C6. 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 Compression fracture involving the C7 vertebral body is again noted as seen on prior study from 2017, slightly progressed in the degree of collapse. No acute fracture. Soft tissues and spinal canal: Prevertebral soft tissues are normal. No epidural or paraspinal hematoma. Disc levels: Congenital fusion across C5-6. Remainder the disc spaces are maintained. Upper chest: Emphysema and biapical scarring. Other: Carotid artery calcifications.  No acute findings. IMPRESSION: No acute intracranial abnormality. Atrophy, chronic microvascular disease. 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 Posterior fusion changes from C3-C6. No acute bony abnormality. Old C7 compression fracture, progressed since 2017. Electronically Signed   By: Rolm Baptise M.D.   On: 04/16/2017 21:15   Dg Hip Unilat W Or Wo Pelvis 2-3 Views Left  Result Date: 03/29/2017 CLINICAL DATA:  The patient suffered a fall today. Left hip pain. Initial encounter. EXAM: DG HIP (WITH OR WITHOUT PELVIS) 2-3V LEFT COMPARISON:  None. FINDINGS: The patient has an acute subcapital fracture of the left hip. No other acute abnormality is identified. IMPRESSION:  Acute subcapital fracture left hip. Electronically Signed   By: Inge Rise M.D.   On: 04/13/2017 21:11      ____________________________________________   PROCEDURES  Procedure(s) performed: None Procedures Critical Care performed:  None ____________________________________________   INITIAL IMPRESSION / ASSESSMENT AND PLAN / ED COURSE  62 y.o. female with a history of cirrhosis of the liver, anemia, CHF, COPD, DM, hypertensionwho presents for evaluation of left hip pain status post mechanical fall. Patient has a shortened externally rotated left lower extremity concerning for a hip fracture. No obvious trauma to the head, neck or back. Due to history  of cirrhosis and thrombocytopenia CT head and neck has been ordered. X-rays of the hip is pending. Labs pending. EKG with no evidence of ischemia or arrhythmias.   ED COURSE:  Negative CT head and cervical spine. X-ray of the hip concerning for hip fracture. Discussed with Dr. Mack Guise who recommended admission to the hospitalist service and nothing by mouth at midnight. Hospitalist has been consulted for admission and medical clearance.   As part of my medical decision making, I reviewed the following data within the [place] notes reviewed and incorporated, Labs reviewed , EKG interpreted , Old chart reviewed, Radiograph reviewed , Discussed with admitting physician , A consult was requested and obtained from this/these consultant(s) Ortho, Notes from prior ED visits and [place] Controlled Substance Database    Pertinent labs & imaging results that were available during my care of the patient were reviewed by me and considered in my medical decision making (see chart for details).    ____________________________________________   FINAL CLINICAL IMPRESSION(S) / ED DIAGNOSES  Final diagnoses:  Fall, initial encounter  Closed fracture of left hip, initial encounter ([place])      NEW MEDICATIONS  STARTED DURING THIS VISIT:  ED Discharge Orders    None       Note:  This document was prepared using Dragon voice recognition software and may include unintentional dictation errors.    Alfred Levins, [place], MD 04/19/2017 [phone]

## 2017-04-21 ENCOUNTER — Inpatient Hospital Stay: Payer: Medicare Other | Admitting: Anesthesiology

## 2017-04-21 ENCOUNTER — Other Ambulatory Visit: Payer: Self-pay

## 2017-04-21 ENCOUNTER — Encounter: Admission: EM | Disposition: E | Payer: Self-pay | Source: Home / Self Care | Attending: Family Medicine

## 2017-04-21 ENCOUNTER — Encounter: Payer: Self-pay | Admitting: *Deleted

## 2017-04-21 ENCOUNTER — Inpatient Hospital Stay: Payer: Medicare Other

## 2017-04-21 HISTORY — PX: HIP ARTHROPLASTY: SHX981

## 2017-04-21 LAB — COMPREHENSIVE METABOLIC PANEL
ALBUMIN: 2.5 g/dL — AB (ref 3.5–5.0)
ALK PHOS: 252 U/L — AB (ref 38–126)
ALT: 35 U/L (ref 14–54)
ANION GAP: 11 (ref 5–15)
AST: 79 U/L — AB (ref 15–41)
BILIRUBIN TOTAL: 0.7 mg/dL (ref 0.3–1.2)
BUN: 52 mg/dL — AB (ref 6–20)
CALCIUM: 8.1 mg/dL — AB (ref 8.9–10.3)
CO2: 29 mmol/L (ref 22–32)
Chloride: 91 mmol/L — ABNORMAL LOW (ref 101–111)
Creatinine, Ser: 2.03 mg/dL — ABNORMAL HIGH (ref 0.44–1.00)
GFR calc Af Amer: 29 mL/min — ABNORMAL LOW (ref >60)
GFR, EST NON AFRICAN AMERICAN: 25 mL/min — AB (ref >60)
GLUCOSE: 115 mg/dL — AB (ref 65–99)
POTASSIUM: 3.9 mmol/L (ref 3.5–5.1)
Sodium: 131 mmol/L — ABNORMAL LOW (ref 135–145)
TOTAL PROTEIN: 6.2 g/dL — AB (ref 6.5–8.1)

## 2017-04-21 LAB — CBC
HCT: 30.1 % — ABNORMAL LOW (ref 35.0–47.0)
HEMATOCRIT: 34.6 % — AB (ref 35.0–47.0)
HEMOGLOBIN: 11.2 g/dL — AB (ref 12.0–16.0)
Hemoglobin: 10.1 g/dL — ABNORMAL LOW (ref 12.0–16.0)
MCH: 31.2 pg (ref 26.0–34.0)
MCH: 31.7 pg (ref 26.0–34.0)
MCHC: 32.5 g/dL (ref 32.0–36.0)
MCHC: 33.5 g/dL (ref 32.0–36.0)
MCV: 95.9 fL (ref 80.0–100.0)
MCV: 94.6 fL (ref 80.0–100.0)
PLATELETS: 126 10*3/uL — AB (ref 150–440)
Platelets: 129 10*3/uL — ABNORMAL LOW (ref 150–440)
RBC: 3.61 MIL/uL — ABNORMAL LOW (ref 3.80–5.20)
RBC: 3.18 MIL/uL — ABNORMAL LOW (ref 3.80–5.20)
RDW: 15.3 % — AB (ref 11.5–14.5)
RDW: 15.1 % — ABNORMAL HIGH (ref 11.5–14.5)
WBC: 5.7 10*3/uL (ref 3.6–11.0)
WBC: 5 10*3/uL (ref 3.6–11.0)

## 2017-04-21 LAB — TYPE AND SCREEN
ABO/RH(D): A NEG
Antibody Screen: NEGATIVE

## 2017-04-21 LAB — BASIC METABOLIC PANEL
Anion gap: 8 (ref 5–15)
BUN: 47 mg/dL — AB (ref 6–20)
CO2: 29 mmol/L (ref 22–32)
Calcium: 7.9 mg/dL — ABNORMAL LOW (ref 8.9–10.3)
Chloride: 93 mmol/L — ABNORMAL LOW (ref 101–111)
Creatinine, Ser: 1.82 mg/dL — ABNORMAL HIGH (ref 0.44–1.00)
GFR calc Af Amer: 33 mL/min — ABNORMAL LOW (ref >60)
GFR, EST NON AFRICAN AMERICAN: 28 mL/min — AB (ref >60)
GLUCOSE: 135 mg/dL — AB (ref 65–99)
POTASSIUM: 3.9 mmol/L (ref 3.5–5.1)
Sodium: 130 mmol/L — ABNORMAL LOW (ref 135–145)

## 2017-04-21 LAB — TROPONIN I
Troponin I: <0.03 ng/mL (ref <0.03)
Troponin I: <0.03 ng/mL (ref <0.03)
Troponin I: <0.03 ng/mL (ref <0.03)

## 2017-04-21 LAB — PROTIME-INR
INR: 1.12
INR: 1.19
PROTHROMBIN TIME: 14.3 s (ref 11.4–15.2)
PROTHROMBIN TIME: 15 s (ref 11.4–15.2)

## 2017-04-21 LAB — SURGICAL PCR SCREEN
MRSA, PCR: NEGATIVE
Staphylococcus aureus: NEGATIVE

## 2017-04-21 LAB — GLUCOSE, CAPILLARY
GLUCOSE-CAPILLARY: 159 mg/dL — AB (ref 65–99)
Glucose-Capillary: 98 mg/dL (ref 65–99)
Glucose-Capillary: 108 mg/dL — ABNORMAL HIGH (ref 65–99)
Glucose-Capillary: 110 mg/dL — ABNORMAL HIGH (ref 65–99)
Glucose-Capillary: 84 mg/dL (ref 65–99)
Glucose-Capillary: 127 mg/dL — ABNORMAL HIGH (ref 65–99)
Glucose-Capillary: 38 mg/dL — CL (ref 65–99)

## 2017-04-21 SURGERY — HEMIARTHROPLASTY, HIP, DIRECT ANTERIOR APPROACH, FOR FRACTURE
Anesthesia: Spinal | Site: Hip | Laterality: Left | Wound class: Clean

## 2017-04-21 MED ORDER — MIDAZOLAM HCL 5 MG/5ML IJ SOLN
INTRAMUSCULAR | Status: DC | PRN
  Administered 2017-04-21: 2 mg via INTRAVENOUS

## 2017-04-21 MED ORDER — ACETAMINOPHEN 650 MG RE SUPP: 650.0000 mg | Freq: Four times a day (QID) | RECTAL | Status: DC | PRN

## 2017-04-21 MED ORDER — SPIRONOLACTONE 25 MG PO TABS
25.0000 mg | ORAL_TABLET | Freq: Every day | ORAL | Status: DC
  Administered 2017-04-23: 25 mg via ORAL
  Filled 2017-04-21 (×2): qty 1

## 2017-04-21 MED ORDER — GLYCOPYRROLATE 0.2 MG/ML IJ SOLN
INTRAMUSCULAR | Status: AC
  Filled 2017-04-21: qty 1

## 2017-04-21 MED ORDER — ENOXAPARIN SODIUM 40 MG/0.4ML ~~LOC~~ SOLN
40.0000 mg | SUBCUTANEOUS | Status: DC
  Administered 2017-04-22 – 2017-05-01 (×10): 40 mg via SUBCUTANEOUS
  Filled 2017-04-21 (×10): qty 0.4

## 2017-04-21 MED ORDER — LEVOTHYROXINE SODIUM 88 MCG PO TABS
88.0000 ug | ORAL_TABLET | Freq: Every day | ORAL | Status: DC
  Administered 2017-04-22 – 2017-04-23 (×2): 88 ug via ORAL
  Filled 2017-04-21 (×6): qty 1

## 2017-04-21 MED ORDER — ALBUTEROL SULFATE (2.5 MG/3ML) 0.083% IN NEBU
2.5000 mg | INHALATION_SOLUTION | Freq: Four times a day (QID) | RESPIRATORY_TRACT | Status: DC | PRN
  Administered 2017-04-23 – 2017-04-24 (×2): 2.5 mg via RESPIRATORY_TRACT
  Filled 2017-04-21 (×2): qty 3

## 2017-04-21 MED ORDER — ARIPIPRAZOLE 5 MG PO TABS
5.0000 mg | ORAL_TABLET | Freq: Every day | ORAL | Status: DC
  Administered 2017-04-21 – 2017-04-23 (×3): 5 mg via ORAL
  Filled 2017-04-21 (×6): qty 1

## 2017-04-21 MED ORDER — SERTRALINE HCL 50 MG PO TABS
200.0000 mg | ORAL_TABLET | Freq: Every day | ORAL | Status: DC
  Administered 2017-04-21 – 2017-04-23 (×3): 200 mg via ORAL
  Filled 2017-04-21 (×3): qty 4

## 2017-04-21 MED ORDER — GLYCOPYRROLATE 0.2 MG/ML IJ SOLN
INTRAMUSCULAR | Status: DC | PRN
  Administered 2017-04-21: 0.2 mg via INTRAVENOUS

## 2017-04-21 MED ORDER — MAGNESIUM OXIDE 400 (241.3 MG) MG PO TABS
400.0000 mg | ORAL_TABLET | Freq: Two times a day (BID) | ORAL | Status: DC
  Administered 2017-04-21 – 2017-04-23 (×5): 400 mg via ORAL
  Filled 2017-04-21 (×5): qty 1

## 2017-04-21 MED ORDER — LIDOCAINE HCL (PF) 2 % IJ SOLN
INTRAMUSCULAR | Status: DC | PRN
  Administered 2017-04-21: 50 mg

## 2017-04-21 MED ORDER — DOCUSATE SODIUM 100 MG PO CAPS
100.0000 mg | ORAL_CAPSULE | Freq: Two times a day (BID) | ORAL | Status: DC
  Administered 2017-04-21 – 2017-04-23 (×5): 100 mg via ORAL
  Filled 2017-04-21 (×5): qty 1

## 2017-04-21 MED ORDER — SENNOSIDES-DOCUSATE SODIUM 8.6-50 MG PO TABS
1.0000 | ORAL_TABLET | Freq: Every evening | ORAL | Status: DC | PRN
  Administered 2017-04-23: 1 via ORAL
  Filled 2017-04-21: qty 1

## 2017-04-21 MED ORDER — ONDANSETRON HCL 4 MG PO TABS: 4.0000 mg | ORAL_TABLET | Freq: Four times a day (QID) | ORAL | Status: DC | PRN

## 2017-04-21 MED ORDER — FERROUS FUM-IRON POLYSACCH 162-115.2 MG PO CAPS
1.0000 | ORAL_CAPSULE | Freq: Two times a day (BID) | ORAL | Status: DC
  Filled 2017-04-21: qty 1

## 2017-04-21 MED ORDER — MIDAZOLAM HCL 2 MG/2ML IJ SOLN
INTRAMUSCULAR | Status: AC
  Filled 2017-04-21: qty 2

## 2017-04-21 MED ORDER — CLINDAMYCIN PHOSPHATE 600 MG/50ML IV SOLN
600.0000 mg | Freq: Four times a day (QID) | INTRAVENOUS | Status: AC
  Administered 2017-04-21 – 2017-04-22 (×2): 600 mg via INTRAVENOUS
  Filled 2017-04-21 (×2): qty 50

## 2017-04-21 MED ORDER — ATORVASTATIN CALCIUM 20 MG PO TABS
20.0000 mg | ORAL_TABLET | Freq: Every day | ORAL | Status: DC
  Administered 2017-04-22: 20 mg via ORAL
  Filled 2017-04-21: qty 1

## 2017-04-21 MED ORDER — MORPHINE SULFATE (PF) 4 MG/ML IV SOLN
0.5000 mg | INTRAVENOUS | Status: DC | PRN
  Administered 2017-04-23: 1 mg via INTRAVENOUS
  Filled 2017-04-21: qty 1

## 2017-04-21 MED ORDER — PANTOPRAZOLE SODIUM 40 MG PO TBEC
40.0000 mg | DELAYED_RELEASE_TABLET | Freq: Every day | ORAL | Status: DC
  Administered 2017-04-22 – 2017-04-23 (×2): 40 mg via ORAL
  Filled 2017-04-21 (×2): qty 1

## 2017-04-21 MED ORDER — KETAMINE HCL 50 MG/ML IJ SOLN
INTRAMUSCULAR | Status: AC
  Filled 2017-04-21: qty 10

## 2017-04-21 MED ORDER — NEOMYCIN-POLYMYXIN B GU 40-200000 IR SOLN
Status: DC | PRN
  Administered 2017-04-21: 16 mL

## 2017-04-21 MED ORDER — EPHEDRINE SULFATE 50 MG/ML IJ SOLN
5.0000 mg | Freq: Once | INTRAMUSCULAR | Status: AC
  Administered 2017-04-21: 5 mg via INTRAVENOUS

## 2017-04-21 MED ORDER — CLINDAMYCIN PHOSPHATE 600 MG/50ML IV SOLN
INTRAVENOUS | Status: AC
Start: 2017-04-21 — End: 2017-04-21
  Filled 2017-04-21: qty 50

## 2017-04-21 MED ORDER — PROPOFOL 10 MG/ML IV BOLUS
INTRAVENOUS | Status: DC | PRN
  Administered 2017-04-21 (×2): 20 mg via INTRAVENOUS

## 2017-04-21 MED ORDER — ACETAMINOPHEN 325 MG PO TABS
650.0000 mg | ORAL_TABLET | Freq: Four times a day (QID) | ORAL | Status: DC | PRN
  Administered 2017-04-22: 650 mg via ORAL
  Filled 2017-04-21: qty 2

## 2017-04-21 MED ORDER — FENTANYL CITRATE (PF) 100 MCG/2ML IJ SOLN
INTRAMUSCULAR | Status: DC | PRN
  Administered 2017-04-21: 50 ug via INTRAVENOUS

## 2017-04-21 MED ORDER — POLYSACCHARIDE IRON COMPLEX 150 MG PO CAPS
150.0000 mg | ORAL_CAPSULE | Freq: Two times a day (BID) | ORAL | Status: DC
  Administered 2017-04-22 – 2017-04-23 (×3): 150 mg via ORAL
  Filled 2017-04-21 (×11): qty 1

## 2017-04-21 MED ORDER — BUPIVACAINE HCL (PF) 0.5 % IJ SOLN
INTRAMUSCULAR | Status: AC
  Filled 2017-04-21: qty 10

## 2017-04-21 MED ORDER — IPRATROPIUM-ALBUTEROL 0.5-2.5 (3) MG/3ML IN SOLN
3.0000 mL | RESPIRATORY_TRACT | Status: DC | PRN
  Administered 2017-04-23: 3 mL via RESPIRATORY_TRACT
  Filled 2017-04-21: qty 3

## 2017-04-21 MED ORDER — ADULT MULTIVITAMIN W/MINERALS CH
1.0000 | ORAL_TABLET | Freq: Every day | ORAL | Status: DC
  Administered 2017-04-22 – 2017-04-23 (×2): 1 via ORAL
  Filled 2017-04-21 (×2): qty 1

## 2017-04-21 MED ORDER — PHENYLEPHRINE HCL 10 MG/ML IJ SOLN
INTRAMUSCULAR | Status: DC | PRN
  Administered 2017-04-21 (×4): 100 ug via INTRAVENOUS
  Administered 2017-04-21: 200 ug via INTRAVENOUS
  Administered 2017-04-21: 100 ug via INTRAVENOUS

## 2017-04-21 MED ORDER — MAGNESIUM CITRATE PO SOLN
1.0000 | Freq: Once | ORAL | Status: DC | PRN
  Filled 2017-04-21: qty 296

## 2017-04-21 MED ORDER — ONDANSETRON HCL 4 MG/2ML IJ SOLN: 4.0000 mg | Freq: Four times a day (QID) | INTRAMUSCULAR | Status: DC | PRN

## 2017-04-21 MED ORDER — CLINDAMYCIN PHOSPHATE 600 MG/50ML IV SOLN
600.0000 mg | Freq: Once | INTRAVENOUS | Status: AC
Start: 2017-04-21 — End: 2017-04-21
  Administered 2017-04-21: 600 mg via INTRAVENOUS

## 2017-04-21 MED ORDER — DEXTROSE 50 % IV SOLN
25.0000 mL | Freq: Once | INTRAVENOUS | Status: AC
  Administered 2017-04-21: 25 mL via INTRAVENOUS

## 2017-04-21 MED ORDER — SODIUM CHLORIDE FLUSH 0.9 % IV SOLN
INTRAVENOUS | Status: AC
  Filled 2017-04-21: qty 10

## 2017-04-21 MED ORDER — LACTATED RINGERS IV BOLUS (SEPSIS)
500.0000 mL | Freq: Once | INTRAVENOUS | Status: AC
  Administered 2017-04-21: 500 mL via INTRAVENOUS

## 2017-04-21 MED ORDER — GLUCOSE 4 G PO CHEW
CHEWABLE_TABLET | ORAL | Status: AC
  Filled 2017-04-21: qty 1

## 2017-04-21 MED ORDER — FENTANYL CITRATE (PF) 100 MCG/2ML IJ SOLN: 25.0000 ug | INTRAMUSCULAR | Status: DC | PRN

## 2017-04-21 MED ORDER — PROPOFOL 500 MG/50ML IV EMUL
INTRAVENOUS | Status: AC
  Filled 2017-04-21: qty 50

## 2017-04-21 MED ORDER — LIDOCAINE HCL (PF) 2 % IJ SOLN
INTRAMUSCULAR | Status: AC
  Filled 2017-04-21: qty 10

## 2017-04-21 MED ORDER — DEXTROSE 50 % IV SOLN
INTRAVENOUS | Status: AC
Start: 2017-04-21 — End: 2017-04-22
  Filled 2017-04-21: qty 50

## 2017-04-21 MED ORDER — INFLUENZA VAC SPLIT QUAD 0.5 ML IM SUSY: 0.5000 mL | PREFILLED_SYRINGE | INTRAMUSCULAR | Status: DC

## 2017-04-21 MED ORDER — BISACODYL 10 MG RE SUPP
10.0000 mg | Freq: Every day | RECTAL | Status: DC | PRN
  Administered 2017-04-24: 10 mg via RECTAL
  Filled 2017-04-21: qty 1

## 2017-04-21 MED ORDER — PHENYLEPHRINE HCL 10 MG/ML IJ SOLN
INTRAMUSCULAR | Status: DC | PRN
  Administered 2017-04-21: 30 ug/min via INTRAVENOUS

## 2017-04-21 MED ORDER — FENTANYL CITRATE (PF) 100 MCG/2ML IJ SOLN
INTRAMUSCULAR | Status: AC
  Filled 2017-04-21: qty 2

## 2017-04-21 MED ORDER — EPINEPHRINE PF 1 MG/ML IJ SOLN
INTRAMUSCULAR | Status: AC
  Filled 2017-04-21: qty 1

## 2017-04-21 MED ORDER — ONDANSETRON HCL 4 MG/2ML IJ SOLN: 4.0000 mg | Freq: Once | INTRAMUSCULAR | Status: DC | PRN

## 2017-04-21 MED ORDER — POLYETHYLENE GLYCOL 3350 17 G PO PACK: 17.0000 g | PACK | Freq: Every day | ORAL | Status: DC | PRN

## 2017-04-21 MED ORDER — METHOCARBAMOL 1000 MG/10ML IJ SOLN: 500.0000 mg | Freq: Four times a day (QID) | INTRAVENOUS | Status: DC | PRN

## 2017-04-21 MED ORDER — EPHEDRINE SULFATE 50 MG/ML IJ SOLN
5.0000 mg | INTRAMUSCULAR | Status: DC | PRN
  Administered 2017-04-21 (×2): 5 mg via INTRAVENOUS

## 2017-04-21 MED ORDER — TRAMADOL HCL 50 MG PO TABS
50.0000 mg | ORAL_TABLET | Freq: Four times a day (QID) | ORAL | Status: DC | PRN
  Administered 2017-04-21 – 2017-04-24 (×6): 50 mg via ORAL
  Filled 2017-04-21 (×6): qty 1

## 2017-04-21 MED ORDER — METHOCARBAMOL 500 MG PO TABS
500.0000 mg | ORAL_TABLET | Freq: Four times a day (QID) | ORAL | Status: DC | PRN
  Administered 2017-04-22: 500 mg via ORAL
  Filled 2017-04-21: qty 1

## 2017-04-21 MED ORDER — KETAMINE HCL 50 MG/ML IJ SOLN
INTRAMUSCULAR | Status: DC | PRN
  Administered 2017-04-21: 25 mg via INTRAVENOUS

## 2017-04-21 MED ORDER — EPHEDRINE SULFATE 50 MG/ML IJ SOLN
INTRAMUSCULAR | Status: DC | PRN
  Administered 2017-04-21 (×2): 10 mg via INTRAVENOUS

## 2017-04-21 MED ORDER — FERROUS FUMARATE 324 (106 FE) MG PO TABS
1.0000 | ORAL_TABLET | Freq: Two times a day (BID) | ORAL | Status: DC
  Administered 2017-04-22 – 2017-04-23 (×3): 106 mg via ORAL
  Filled 2017-04-21 (×11): qty 1

## 2017-04-21 MED ORDER — MOMETASONE FURO-FORMOTEROL FUM 100-5 MCG/ACT IN AERO
2.0000 | INHALATION_SPRAY | Freq: Two times a day (BID) | RESPIRATORY_TRACT | Status: DC
  Administered 2017-04-22 – 2017-04-24 (×5): 2 via RESPIRATORY_TRACT
  Filled 2017-04-21: qty 8.8

## 2017-04-21 MED ORDER — ALUM & MAG HYDROXIDE-SIMETH 200-200-20 MG/5ML PO SUSP
30.0000 mL | ORAL | Status: DC | PRN
  Filled 2017-04-21: qty 30

## 2017-04-21 MED ORDER — METOPROLOL TARTRATE 50 MG PO TABS
50.0000 mg | ORAL_TABLET | Freq: Two times a day (BID) | ORAL | Status: DC
  Administered 2017-04-21 – 2017-04-23 (×3): 50 mg via ORAL
  Filled 2017-04-21 (×4): qty 1

## 2017-04-21 MED ORDER — PROPOFOL 500 MG/50ML IV EMUL
INTRAVENOUS | Status: DC | PRN
  Administered 2017-04-21: 25 ug/kg/min via INTRAVENOUS

## 2017-04-21 MED ORDER — EPHEDRINE SULFATE 50 MG/ML IJ SOLN
INTRAMUSCULAR | Status: AC
Start: 2017-04-21 — End: 2017-04-21
  Administered 2017-04-21: 5 mg via INTRAVENOUS
  Filled 2017-04-21: qty 1

## 2017-04-21 MED ORDER — CALCIUM CARBONATE-VITAMIN D 500-200 MG-UNIT PO TABS
ORAL_TABLET | Freq: Every day | ORAL | Status: DC
  Administered 2017-04-22 – 2017-04-23 (×2): 1 via ORAL
  Filled 2017-04-21 (×2): qty 1

## 2017-04-21 MED ORDER — BUPIVACAINE HCL (PF) 0.5 % IJ SOLN
INTRAMUSCULAR | Status: DC | PRN
  Administered 2017-04-21: 3 mL via INTRATHECAL

## 2017-04-21 MED ORDER — LAMOTRIGINE 25 MG PO TABS
150.0000 mg | ORAL_TABLET | Freq: Every day | ORAL | Status: DC
  Administered 2017-04-21 – 2017-04-23 (×3): 150 mg via ORAL
  Filled 2017-04-21 (×3): qty 6

## 2017-04-21 MED ORDER — ALLOPURINOL 100 MG PO TABS
100.0000 mg | ORAL_TABLET | Freq: Every day | ORAL | Status: DC
  Administered 2017-04-22: 100 mg via ORAL
  Filled 2017-04-21 (×6): qty 1

## 2017-04-21 MED ORDER — NEOMYCIN-POLYMYXIN B GU 40-200000 IR SOLN
Status: AC
  Filled 2017-04-21: qty 20

## 2017-04-21 SURGICAL SUPPLY — 51 items
BLADE SAGITTAL WIDE XTHICK NO (BLADE) ×3 IMPLANT
BLADE SURG SZ10 CARB STEEL (BLADE) ×3 IMPLANT
BNDG COHESIVE 4X5 TAN STRL (GAUZE/BANDAGES/DRESSINGS) ×3 IMPLANT
CANISTER SUCT 1200ML W/VALVE (MISCELLANEOUS) ×3 IMPLANT
CANISTER SUCT 3000ML PPV (MISCELLANEOUS) ×6 IMPLANT
CAPT HIP HEMI 2 ×3 IMPLANT
CHLORAPREP W/TINT 26ML (MISCELLANEOUS) ×9 IMPLANT
DRAPE IMP U-DRAPE 54X76 (DRAPES) ×3 IMPLANT
DRAPE INCISE IOBAN 66X60 STRL (DRAPES) ×3 IMPLANT
DRAPE SHEET LG 3/4 BI-LAMINATE (DRAPES) ×6 IMPLANT
DRAPE SURG 17X11 SM STRL (DRAPES) ×3 IMPLANT
DRAPE TABLE BACK 80X90 (DRAPES) ×3 IMPLANT
DRSG OPSITE POSTOP 4X10 (GAUZE/BANDAGES/DRESSINGS) ×3 IMPLANT
DURAPREP 26ML APPLICATOR (WOUND CARE) IMPLANT
ELECT BLADE 6.5 EXT (BLADE) ×3 IMPLANT
ELECT CAUTERY BLADE 6.4 (BLADE) ×3 IMPLANT
ELECT REM PT RETURN 9FT ADLT (ELECTROSURGICAL) ×3
ELECTRODE REM PT RTRN 9FT ADLT (ELECTROSURGICAL) ×1 IMPLANT
GAUZE PETRO XEROFOAM 1X8 (MISCELLANEOUS) IMPLANT
GAUZE SPONGE 4X4 12PLY STRL (GAUZE/BANDAGES/DRESSINGS) IMPLANT
GLOVE BIOGEL PI IND STRL 9 (GLOVE) IMPLANT
GLOVE BIOGEL PI INDICATOR 9 (GLOVE)
GLOVE SURG 9.0 ORTHO LTXF (GLOVE) IMPLANT
GOWN STRL REUS TWL 2XL XL LVL4 (GOWN DISPOSABLE) ×3 IMPLANT
GOWN STRL REUS W/ TWL LRG LVL3 (GOWN DISPOSABLE) ×1 IMPLANT
GOWN STRL REUS W/TWL LRG LVL3 (GOWN DISPOSABLE) ×2
HEMOVAC 400ML (MISCELLANEOUS)
HIP CAPITATED HEMI 2 ×1 IMPLANT
KIT DRAIN HEMOVAC JP 7FR 400ML (MISCELLANEOUS) IMPLANT
KIT TURNOVER KIT A (KITS) ×3 IMPLANT
NDL SAFETY ECLIPSE 18X1.5 (NEEDLE) ×1 IMPLANT
NEEDLE FILTER BLUNT 18X 1/2SAF (NEEDLE) ×2
NEEDLE FILTER BLUNT 18X1 1/2 (NEEDLE) ×1 IMPLANT
NEEDLE HYPO 18GX1.5 SHARP (NEEDLE) ×2
NEEDLE MAYO CATGUT SZ4 (NEEDLE) ×3 IMPLANT
NS IRRIG 1000ML POUR BTL (IV SOLUTION) ×3 IMPLANT
PACK HIP PROSTHESIS (MISCELLANEOUS) ×3 IMPLANT
PILLOW ABDUC SM (MISCELLANEOUS) ×3 IMPLANT
PULSAVAC PLUS IRRIG FAN TIP (DISPOSABLE) ×3
RETRIEVER SUT HEWSON (MISCELLANEOUS) IMPLANT
SOL .9 NS 3000ML IRR  AL (IV SOLUTION) ×2
SOL .9 NS 3000ML IRR UROMATIC (IV SOLUTION) ×1 IMPLANT
STAPLER SKIN PROX 35W (STAPLE) ×3 IMPLANT
SUT TICRON 2-0 30IN 311381 (SUTURE) ×12 IMPLANT
SUT VIC AB 0 CT1 36 (SUTURE) ×6 IMPLANT
SUT VIC AB 2-0 CT2 27 (SUTURE) ×6 IMPLANT
SYR 10ML LL (SYRINGE) ×3 IMPLANT
TAPE MICROFOAM 4IN (TAPE) ×3 IMPLANT
TAPE TRANSPORE STRL 2 31045 (GAUZE/BANDAGES/DRESSINGS) ×3 IMPLANT
TIP BRUSH PULSAVAC PLUS 24.33 (MISCELLANEOUS) ×3 IMPLANT
TIP FAN IRRIG PULSAVAC PLUS (DISPOSABLE) ×1 IMPLANT

## 2017-04-21 NOTE — Clinical Social Work Note (Signed)
Clinical Social Work Assessment  Patient Details  Name: Samantha Mcmillan MRN: [id number] Date of Birth: [birth year]  Date of referral:  04/17/2017               Reason for consult:  Facility Placement                Permission sought to share information with:  Chartered certified accountant granted to share information::  Yes, Verbal Permission Granted  Name::      Samantha::   Baxter Mcmillan   Relationship::     Contact Information:     Housing/Transportation Living arrangements for the past 2 months:  [place] of Information:  Patient, Spouse Patient Interpreter Needed:  None Criminal Activity/Legal Involvement Pertinent to Current Situation/Hospitalization:  No - Comment as needed Significant Relationships:  Spouse Lives with:  Spouse Do you feel safe going back to the place where you live?  Yes Need for family participation in patient care:  Yes (Comment)  Care giving concerns: Patient lives in [place] with her ex-husband/ caregiver Samantha Mcmillan [phone].    Social Worker assessment / plan:  Holiday representative (CSW) reviewed chart and noted that patient will have surgery today for a hip fracture. CSW met with patient and her ex-husband Samantha Mcmillan was at bedside. CSW introduced self and explained role of CSW department. Patient was alert and oriented X4 and was laying in the bed. Patient reported that she lives with her ex-husband Samantha Mcmillan and he is her caregiver. CSW explained that PT will evaluate patient after surgery and make a recommendation of home health or SNF. Patient requested to go to SNF at [facility] and stated that she has been there before. CSW explained that [facility] will have to approve SNF. Patient and Samantha Mcmillan verbalized their understanding. FL2 complete and faxed out. CSW will continue to follow and assist as needed.   Employment status:  Disabled (Comment on whether or not currently receiving  Disability) Insurance information:  Managed Medicare PT Recommendations:  Not assessed at this time Information / Referral to community resources:  [place]  Patient/Family's Response to care:  Patient requested [facility].   Patient/Family's Understanding of and Emotional Response to Diagnosis, Current Treatment, and Prognosis:  Patient and Samantha Mcmillan were very pleasant and thanked CSW for assistance.   Emotional Assessment Appearance:  Appears stated age Attitude/Demeanor/Rapport:    Affect (typically observed):  Accepting, Adaptable, Pleasant Orientation:  Oriented to Self, Oriented to Place, Oriented to  Time, Oriented to Situation Alcohol / Substance use:  Not Applicable Psych involvement (Current and /or in the community):  No (Comment)  Discharge Needs  Concerns to be addressed:  Discharge Planning Concerns Readmission within the last 30 days:  No Current discharge risk:  Dependent with Mobility Barriers to Discharge:  Continued Medical Work up   [employer], [name], LCSW 04/18/2017, 3:35 PM

## 2017-04-21 NOTE — Anesthesia Procedure Notes (Signed)
Spinal  Patient location during procedure: OR Staffing Anesthesiologist: Molli Barrows, MD Resident/CRNA: Rolla Plate, CRNA Performed: anesthesiologist  Preanesthetic Checklist Completed: patient identified, site marked, surgical consent, pre-op evaluation, timeout performed, IV checked, risks and benefits discussed and monitors and equipment checked Spinal Block Patient position: right lateral decubitus Prep: ChloraPrep and site prepped and draped Patient monitoring: heart rate, continuous pulse ox, blood pressure and cardiac monitor Approach: midline Location: L4-5 Injection technique: single-shot Needle Needle type: Introducer and Quincke  Needle gauge: 22 G Needle length: 9 cm Additional Notes Negative paresthesia. Negative blood return. Positive free-flowing CSF. Expiration date of kit checked and confirmed. Patient tolerated procedure well, without complications.

## 2017-04-21 NOTE — Consult Note (Signed)
[facility] Consultation Note  Patient ID: Samantha Mcmillan, MRN: [id number], DOB/AGE: 64-17-1957 64 y.o. Admit date: 04/07/2017   Date of Consult: 04/13/2017 Primary Physician: Casilda Carls, MD Primary Cardiologist: Nehemiah Massed  Chief Complaint:  Chief Complaint  Patient presents with  . Hip Pain  . Fall   Reason for Consult: Cardiovascular disease  HPI: 62 y.o. female with known apparent chronic diastolic dysfunction congestive heart failure chronic kidney disease stage IV essential hypertension mixed hyperlipidemia for which the patient has been stable over the last many years.  The patient has been on appropriate metoprolol use for heart rate control blood pressure control.  She has not had any exacerbation of congestive heart failure or anginal symptoms.  She does have a mild cough and congestion for which is not significantly change over 2 weeks.  The patient does walk in a walker and is able to do most of her activity without new symptoms.  She was difficult to from walking from her kitchen to her walker when she fell and broke her left hip.  At that time she now needs further intervention.  Further evaluation of her risk of cardiovascular disease with surgical intervention shows that she is less than 1% for cardiovascular complication with surgical intervention based on RCR I risk complication assessment  Past Medical History:  Diagnosis Date  . Acute on chronic diastolic CHF (congestive heart failure) ([place]) 05/16/2014  . Anemia   . Anxiety   . Arthritis    "some in my feet" (05/04/2014)  . Asthma   . Chronic bronchitis ([place])    "get it pretty much q yr"   . Chronic lower back pain   . Chronic respiratory failure ([place])   . Cirrhosis of liver ([place])   . Cirrhosis of liver ([place])   . Colon polyp   . COPD (chronic obstructive pulmonary disease) ([place])   . Depression   . Family history of adverse reaction to anesthesia    "my sister doesn't wake up good when she's put  deep to sleep"  . GERD (gastroesophageal reflux disease)   . Headache    "more than a couple times/wk" (05/04/2014)  . Heart murmur   . High cholesterol   . History of blood transfusion    "related to my anemia"  . Hypertension   . IBS (irritable bowel syndrome)   . IDA (iron deficiency anemia) 08/14/2014  . Iron deficiency anemia   . OCD (obsessive compulsive disorder)   . On home oxygen therapy    "2L; 24/7" (05/04/2014)  . Panic attack   . Personal history of tobacco use, presenting hazards to health 06/21/2015  . Pneumonia "several times"  . PTSD (post-traumatic stress disorder)   . Sleep apnea    "can't tolerate CPAP" (05/04/2014)  . Type II diabetes mellitus ([place])       Surgical History:  Past Surgical History:  Procedure Laterality Date  . BACK SURGERY    . CATARACT EXTRACTION W/ INTRAOCULAR LENS  IMPLANT, BILATERAL Bilateral 2005  . CESAREAN SECTION  1979  . COLONOSCOPY  2014  . COLONOSCOPY WITH PROPOFOL N/A 12/08/2016   Procedure: COLONOSCOPY WITH PROPOFOL;  Surgeon: Lucilla Lame, MD;  Location: [facility] ENDOSCOPY;  Service: Endoscopy;  Laterality: N/A;  . DILATION AND CURETTAGE OF UTERUS    . ESOPHAGOGASTRODUODENOSCOPY (EGD) WITH PROPOFOL N/A 12/08/2016   Procedure: ESOPHAGOGASTRODUODENOSCOPY (EGD) WITH PROPOFOL;  Surgeon: Lucilla Lame, MD;  Location: [facility] ENDOSCOPY;  Service: Endoscopy;  Laterality: N/A;  . INCISION AND  DRAINAGE OF WOUND Right 2009   "leg mauled  by dog"  . PERIPHERAL VASCULAR CATHETERIZATION N/A 08/16/2014   Procedure: PICC Line Insertion;  Surgeon: Algernon Huxley, MD;  Location: [place] CV LAB;  Service: Cardiovascular;  Laterality: N/A;  . POSTERIOR FUSION CERVICAL SPINE  2015   "rebuilt 3 of my neck vertebrae"  . TUBAL LIGATION  1979  . UPPER GASTROINTESTINAL ENDOSCOPY       Home Meds: Prior to Admission medications   Medication Sig Start Date End Date Taking? Authorizing Provider  albuterol (PROAIR HFA) 108 (90 Base) MCG/ACT inhaler ProAir  HFA 90 mcg/actuation aerosol inhaler   Yes [provider]  allopurinol (ZYLOPRIM) 100 MG tablet Take 100 mg by mouth daily.    Yes [provider]  ARIPiprazole (ABILIFY) 5 MG tablet Take 5 mg by mouth at bedtime.    Yes [provider]  atorvastatin (LIPITOR) 20 MG tablet Take 20 mg by mouth daily.   Yes [provider]  budesonide-formoterol (SYMBICORT) 80-4.5 MCG/ACT inhaler Inhale 2 puffs into the lungs 2 (two) times daily.    Yes [provider]  bumetanide (BUMEX) 1 MG tablet Take 2 mg by mouth 2 (two) times daily.  07/04/15  Yes [provider]  Calcium Carbonate-Vitamin D (CALTRATE 600+D PO) Take 1 tablet by mouth daily.   Yes [provider]  ferrous fumarate-iron polysaccharide complex (TANDEM) 162-115.2 MG CAPS capsule Take 1 capsule by mouth 2 (two) times daily after a meal. 12/10/16  Yes Cammie Sickle, MD  HYDROmorphone (DILAUDID) 2 MG tablet Take 1 tablet (2 mg total) by mouth every 6 (six) hours as needed for severe pain. Patient taking differently: Take 2 mg by mouth every 8 (eight) hours as needed for severe pain.  07/09/15  Yes Mody, Ulice Bold, MD  insulin glargine (LANTUS) 100 UNIT/ML injection Inject 30 Units into the skin every morning.    Yes [provider]  ipratropium-albuterol (DUONEB) 0.5-2.5 (3) MG/3ML SOLN Take 3 mLs by nebulization every 4 (four) hours as needed.   Yes [provider]  lamoTRIgine (LAMICTAL) 150 MG tablet Take 150 mg by mouth at bedtime.    Yes [provider]  levothyroxine (SYNTHROID, LEVOTHROID) 88 MCG tablet Take 88 mcg by mouth daily before breakfast.    Yes [provider]  linaclotide (LINZESS) 290 MCG CAPS capsule Take 1 capsule (290 mcg total) daily before breakfast by mouth. 01/01/17  Yes Lucilla Lame, MD  lisinopril (PRINIVIL,ZESTRIL) 20 MG tablet Take 20 mg by mouth daily.   Yes [provider]  magnesium oxide (MAG-OX) 400 MG tablet  Take 1 tablet by mouth 2 (two) times daily.    Yes [provider]  metolazone (ZAROXOLYN) 5 MG tablet Take 5 mg by mouth daily.   Yes [provider]  metoprolol (LOPRESSOR) 50 MG tablet Take 50 mg by mouth 2 (two) times daily.   Yes [provider]  Multiple Vitamin (MULTIVITAMIN WITH MINERALS) TABS tablet Take 1 tablet by mouth daily. 05/18/14  Yes Rama, Venetia Maxon, MD  pantoprazole (PROTONIX) 40 MG tablet Take 40 mg by mouth daily.   Yes [provider]  sertraline (ZOLOFT) 100 MG tablet Take 200 mg by mouth daily.    Yes [provider]  spironolactone (ALDACTONE) 25 MG tablet Take 25 mg by mouth daily.   Yes [provider]  albuterol (PROVENTIL) (2.5 MG/3ML) 0.083% nebulizer solution Take 2.5 mg by nebulization every 6 (six) hours as needed  for wheezing or shortness of breath.    [provider]  spironolactone (ALDACTONE) 50 MG tablet Take 1 tablet (50 mg total) by mouth daily. 03/07/16 03/07/17  Earleen Newport, MD    Inpatient Medications:  . allopurinol  100 mg Oral Daily  . ARIPiprazole  5 mg Oral QHS  . atorvastatin  20 mg Oral Daily  . calcium-vitamin D   Oral Daily  . Ferrous Fumarate  1 tablet Oral BID PC   And  . iron polysaccharides  150 mg Oral BID PC  . [START ON 04/22/2017] Influenza vac split quadrivalent PF  0.5 mL Intramuscular Tomorrow-1000  . insulin aspart  0-15 Units Subcutaneous TID WC  . insulin aspart  0-5 Units Subcutaneous QHS  . lamoTRIgine  150 mg Oral QHS  . levothyroxine  88 mcg Oral QAC breakfast  . magnesium oxide  400 mg Oral BID  . metoprolol tartrate  50 mg Oral BID  . mometasone-formoterol  2 puff Inhalation BID  . multivitamin with minerals  1 tablet Oral Daily  . pantoprazole  40 mg Oral Daily  . sertraline  200 mg Oral Daily  . spironolactone  25 mg Oral Daily   . sodium chloride 75 mL/hr at 04/13/2017 0011    Allergies:  Allergies  Allergen Reactions  . Iodine Shortness  Of Breath  . Latex Anaphylaxis  . Other Rash, Other (See Comments), Shortness Of Breath, Anaphylaxis and Swelling    Pt states that she is allergic to Darvocet.   . Oxycodone Shortness Of Breath, Rash and Swelling    Other reaction(s): Other (See Comments) wheezing  . Oxycodone-Acetaminophen Other (See Comments), Rash, Swelling and Shortness Of Breath    wheezing wheezing  . Shellfish-Derived Products Anaphylaxis, Rash, Shortness Of Breath and Swelling    Throat closes.  . Amitriptyline Other (See Comments)    Other reaction(s): Other (See Comments) Mental changes Other reaction(s): Other (See Comments) Altered mental status, agitation Reaction:  Mental changes   . Fish-Derived Products Other (See Comments) and Swelling    Other reaction(s): Other (See Comments) "respiratory distress and wheezing" "respiratory distress and wheezing"  . Tape Other (See Comments)    Other reaction(s): Other (See Comments) Pulls skin off Pt states that it pulls her skin off.  Pt states that it pulls her skin off.   . Wellbutrin [Bupropion] Other (See Comments)    Other reaction(s): Other (See Comments) Mental changes Reaction:  Mental changes   . Augmentin [Amoxicillin-Pot Clavulanate] Rash and Other (See Comments)    Has patient had a PCN reaction causing immediate rash, facial/tongue/throat swelling, SOB or lightheadedness with hypotension: No Has patient had a PCN reaction causing severe rash involving mucus membranes or skin necrosis: No Has patient had a PCN reaction that required hospitalization No Has patient had a PCN reaction occurring within the last 10 years: No If all of the above answers are "NO", then may proceed with Cephalosporin use.  . Codeine Rash  . Cortisone Rash  . Diphenhydramine Rash  . Hydrocodone-Acetaminophen Rash    dizzy  . Vicodin [Hydrocodone-Acetaminophen] Rash and Other (See Comments)    Reaction:  Dizziness      Social History   Socioeconomic History  .  Marital status: Divorced    Spouse name: Not on file  . Number of children: Not on file  . Years of education: Not on file  . Highest education level: Not on file  Social Needs  . Financial resource strain: Not on  file  . Food insecurity - worry: Not on file  . Food insecurity - inability: Not on file  . Transportation needs - medical: Not on file  . Transportation needs - non-medical: Not on file  Occupational History  . Occupation: disabled  Tobacco Use  . Smoking status: Current Every Day Smoker    Packs/day: 0.50    Years: 48.00    Pack years: 24.00    Types: Cigarettes  . Smokeless tobacco: Never Used  Substance and Sexual Activity  . Alcohol use: No    Alcohol/week: 0.0 oz  . Drug use: No  . Sexual activity: Not Currently  Other Topics Concern  . Not on file  Social History Narrative  . Not on file     Family History  Problem Relation Age of Onset  . Cancer Father        pancreatic  . Cancer Brother   . Breast cancer Maternal Aunt      Review of Systems Positive for limb pain Negative for: General:  chills, fever, night sweats or weight changes.  Cardiovascular: PND orthopnea syncope dizziness  Dermatological skin lesions rashes Respiratory: Cough congestion Urologic: Frequent urination urination at night and hematuria Abdominal: negative for nausea, vomiting, diarrhea, bright red blood per rectum, melena, or hematemesis Neurologic: negative for visual changes, and/or hearing changes  All other systems reviewed and are otherwise negative except as noted above.  Labs: Recent Labs    04/22/2017 2256 04/17/2017 0421  TROPONINI <0.03 <0.03   Lab Results  Component Value Date   WBC 5.0 04/07/2017   HGB 10.1 (L) 04/13/2017   HCT 30.1 (L) 04/13/2017   MCV 94.6 03/31/2017   PLT 126 (L) 04/05/2017    Recent Labs  Lab 03/31/2017 2330 04/08/2017 0421  NA 131* 130*  K 3.9 3.9  CL 91* 93*  CO2 29 29  BUN 52* 47*  CREATININE 2.03* 1.82*  CALCIUM 8.1* 7.9*   PROT 6.2*  --   BILITOT 0.7  --   ALKPHOS 252*  --   ALT 35  --   AST 79*  --   GLUCOSE 115* 135*   No results found for: CHOL, HDL, LDLCALC, TRIG No results found for: DDIMER  Radiology/Studies:  Dg Chest 1 View  Result Date: 04/01/2017 CLINICAL DATA:  62 year old female with fall and left hip pain. Preop evaluation radiograph. EXAM: CHEST 1 VIEW COMPARISON:  Chest radiograph dated 12/27/2015 FINDINGS: The lungs are clear. There is no pleural effusion or pneumothorax. The cardiac silhouette is within normal limits. Partially visualized lower cervical fusion hardware. No acute osseous pathology. IMPRESSION: No active disease. Electronically Signed   By: Anner Crete M.D.   On: 04/22/2017 21:14   Ct Head Wo Contrast  Result Date: 04/16/2017 CLINICAL DATA:  Fall. EXAM: CT HEAD WITHOUT CONTRAST CT CERVICAL SPINE WITHOUT CONTRAST TECHNIQUE: Multidetector CT imaging of the head and cervical spine was performed following the standard protocol without intravenous contrast. Multiplanar CT image reconstructions of the cervical spine were also generated. COMPARISON:  10/31/2015 FINDINGS: CT HEAD FINDINGS Brain: There is atrophy and chronic small vessel disease changes. No acute intracranial abnormality. Specifically, no hemorrhage, hydrocephalus, mass lesion, acute infarction, or significant intracranial injury. Vascular: No hyperdense vessel or unexpected calcification. Skull: No acute calvarial abnormality. Sinuses/Orbits: Visualized paranasal sinuses and mastoids clear. Orbital soft tissues unremarkable. Other: None CT CERVICAL SPINE FINDINGS Alignment: Normal Skull base and vertebrae: No fracture. Prior posterior fusion from C3-C6. 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 Compression fracture involving  the C7 vertebral body is again noted as seen on prior study from 2017, slightly progressed in the degree of collapse. No acute fracture. Soft tissues and spinal canal: Prevertebral soft tissues are normal. No  epidural or paraspinal hematoma. Disc levels: Congenital fusion across C5-6. Remainder the disc spaces are maintained. Upper chest: Emphysema and biapical scarring. Other: Carotid artery calcifications.  No acute findings. IMPRESSION: No acute intracranial abnormality. Atrophy, chronic microvascular disease. 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 Posterior fusion changes from C3-C6. No acute bony abnormality. Old C7 compression fracture, progressed since 2017. Electronically Signed   By: Rolm Baptise M.D.   On: 04/03/2017 21:15   Ct Cervical Spine Wo Contrast  Result Date: 03/27/2017 CLINICAL DATA:  Fall. EXAM: CT HEAD WITHOUT CONTRAST CT CERVICAL SPINE WITHOUT CONTRAST TECHNIQUE: Multidetector CT imaging of the head and cervical spine was performed following the standard protocol without intravenous contrast. Multiplanar CT image reconstructions of the cervical spine were also generated. COMPARISON:  10/31/2015 FINDINGS: CT HEAD FINDINGS Brain: There is atrophy and chronic small vessel disease changes. No acute intracranial abnormality. Specifically, no hemorrhage, hydrocephalus, mass lesion, acute infarction, or significant intracranial injury. Vascular: No hyperdense vessel or unexpected calcification. Skull: No acute calvarial abnormality. Sinuses/Orbits: Visualized paranasal sinuses and mastoids clear. Orbital soft tissues unremarkable. Other: None CT CERVICAL SPINE FINDINGS Alignment: Normal Skull base and vertebrae: No fracture. Prior posterior fusion from C3-C6. 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 Compression fracture involving the C7 vertebral body is again noted as seen on prior study from 2017, slightly progressed in the degree of collapse. No acute fracture. Soft tissues and spinal canal: Prevertebral soft tissues are normal. No epidural or paraspinal hematoma. Disc levels: Congenital fusion across C5-6. Remainder the disc spaces are maintained. Upper chest: Emphysema and biapical scarring. Other: Carotid artery  calcifications.  No acute findings. IMPRESSION: No acute intracranial abnormality. Atrophy, chronic microvascular disease. 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 Posterior fusion changes from C3-C6. No acute bony abnormality. Old C7 compression fracture, progressed since 2017. Electronically Signed   By: Rolm Baptise M.D.   On: 04/01/2017 21:15   Dg Hip Unilat W Or Wo Pelvis 2-3 Views Left  Result Date: 04/05/2017 CLINICAL DATA:  The patient suffered a fall today. Left hip pain. Initial encounter. EXAM: DG HIP (WITH OR WITHOUT PELVIS) 2-3V LEFT COMPARISON:  None. FINDINGS: The patient has an acute subcapital fracture of the left hip. No other acute abnormality is identified. IMPRESSION: Acute subcapital fracture left hip. Electronically Signed   By: Inge Rise M.D.   On: 03/27/2017 21:11    EKG: Normal sinus rhythm  Weights: Filed Weights   04/09/2017 2030 04/03/2017 0008  Weight: 155 lb (70.3 kg) 156 lb 14.4 oz (71.2 kg)     Physical Exam: Blood pressure (!) 119/54, pulse 90, temperature 98.9 F (37.2 C), temperature source Oral, resp. rate 18, height 5\' 4"  (1.626 m), weight 156 lb 14.4 oz (71.2 kg), SpO2 95 %. Body mass index is 26.93 kg/m. General: Well developed, well nourished, in no acute distress. Head eyes ears nose throat: Normocephalic, atraumatic, sclera non-icteric, no xanthomas, nares are without discharge. No apparent thyromegaly and/or mass  Lungs: Normal respiratory effort.  no wheezes, no rales, no rhonchi.  Heart: RRR with normal S1 S2. no murmur gallop, no rub, PMI is normal size and placement, carotid upstroke normal without bruit, jugular venous pressure is normal Abdomen: Soft, non-tender, non-distended with normoactive bowel sounds. No hepatomegaly. No rebound/guarding. No obvious abdominal masses. Abdominal aorta is normal size without bruit  Extremities: No edema. no cyanosis, no clubbing, no ulcers  Peripheral : 2+ bilateral upper extremity pulses, 2+ bilateral femoral  pulses, 2+ bilateral dorsal pedal pulse Neuro: Alert and oriented. No facial asymmetry. No focal deficit. Moves all extremities spontaneously. Musculoskeletal: Normal muscle tone without kyphosis Psych:  Responds to questions appropriately with a normal affect.    Assessment: 62 year old female with chronic kidney disease and history of hypertension and chronic diastolic dysfunction heart failure with no current symptoms or new EKG changes of cardiovascular disease with left hip fracture needing surgical intervention and currently class I risk of less than 1%  Plan: 1.  No further cardiac diagnostics necessary prior to surgical intervention 2.  Continue metoprolol for heart rate control blood pressure control and treatment of diastolic dysfunction 3.  Follow fluid balances closely for the possibility of need of intravenous Lasix for pulmonary edema if diastolic dysfunction causes heart failure 4.  Proceed to a surgical intervention for left hip fracture at this time  Signed, Corey Skains M.D. [facility] 04/18/2017, 8:54 AM

## 2017-04-21 NOTE — Op Note (Signed)
04/03/2017  5:19 PM  PATIENT:  Samantha Mcmillan   MRN: [id number]  PRE-OPERATIVE DIAGNOSIS:  left femoral neck fracture  POST-OPERATIVE DIAGNOSIS:  left femoral neck fracture  PROCEDURE:  Procedure(s): LEFT HIP HEMIARTHROPLASTY   PREOPERATIVE INDICATIONS:    Samantha Mcmillan is an 62 y.o. female who was admitted with a diagnosis of left femoral neck fracture.  I have recommended surgical fixation with hemiarthroplasty for this injury. I have explained the surgery and the postoperative course to the patient and their son who have agreed with surgical management of this fracture.    The risks benefits and alternatives were discussed with the patient and their family including but not limited to the risks of  infection requiring removal of the prosthesis, bleeding requiring blood transfusion, nerve injury especially to the sciatic nerve leading to foot drop or lower extremity numbness, periprosthetic fracture, dislocation leg length discrepancy, change in lower extremity rotation persistent hip pain, loosening or failure of the components and the need for revision surgery. Medical risks include but are not limited to DVT and pulmonary embolism, myocardial infarction, stroke, pneumonia, respiratory failure and death.  OPERATIVE REPORT     SURGEON:  Thornton Park, MD    ANESTHESIA:  Spinal    COMPLICATIONS:  None.   SPECIMEN: Femoral head to pathology    COMPONENTS:  [employer]ker Accolade 2 femoral component size 4 with a 43 mm -4 neck adjustment sleeve.    PROCEDURE IN DETAIL:   The patient was met in the holding area and  identified.  The appropriate hip was identified and marked at the operative site after verbally confirming with the patient that this was the correct site of surgery.  The patient was then transported to the OR  and  underwent spinal anesthesia.  The patient was then placed in the lateral decubitus position with the operative side up and secured on the operating room  table with a pegboard and all bony prominences were adequately padded. This included an axillary roll and additional padding around the nonoperative leg to prevent compression to the common peroneal nerve.    The operative lower extremity was prepped and draped in a sterile fashion.  A time out was performed prior to incision to verify patient's name, date of birth, medical record number, correct site of surgery correct procedure to be performed. The timeout was also used to verify the patient received antibiotics now appropriate instruments, implants and radiographic studies were available in the room. Once all in attendance were in agreement case began.    A posterolateral approach was utilized via sharp dissection  carried down to the subcutaneous tissue.  Bleeding vessels were coagulated using electrocautery.  The fascia lata was identified and incised along the length of the skin incision.  The gluteus maximus muscle was then split in line with its fibers. Self-retaining retractors were  inserted.  With the hip internally rotated, the short external rotators  were identified and removed from the posterior attachment from the greater trochanter. The piriformis was tagged for later repair. The capsule was identified and a T-shaped capsulotomy was performed. The capsule was tagged with #2 Tycron for later repair.  The femoral neck fracture was exposed, and the femoral head was removed using a corkscrew device. This was measured to be 43 mm in diameter. The attention was then turned to proximal femur preparation.  An oscillating saw was used to perform a proximal femoral osteotomy 1 fingerbreadth above the lesser trochanter. The trial 43 mm femoral  head was placed into the acetabulum and had an excellent suction fit. The attention was then turned back to femoral preparation.    A femoral skid and Cobra retractor were placed under the femoral neck to allow for adequate visualization. A box osteotome was used  to make the initial entry into the proximal femur. A single hand reamer was used to prepare the femoral canal. A T-shaped femoral canal sounder was then used to ensure no penetration femoral cortex had occurred during reaming. The proximal femur was then sequentially broached by hand. A size 4 femoral trial broach was found to have best medial to lateral canal fit. Once adequate mediolateral canal fill was achieved the trial femoral broach, neck, and head was assembled and the hip was reduced. It was found to have excellent stability, equivalent leg lengths with functional range of motion. The trial components were then removed.  I copiously irrigated the femoral canal and then impacted the real femoral prosthesis into place into the appropriate version, slightly anteverted to the normal anatomy, and I impacted the actual 43 mm Unitrax femoral component with a -4 neck adjustment sleeve into place. The hip was then reduced and taken through functional range of motion and found to have excellent stability. Leg lengths were restored. The hip joint was copiously irrigated.   A soft tissue repair of the capsule and external rotators was performed using #2 Tycron.  Excellent posterior capsular repair was achieved. The external rotators were repaired using drill holes in the greater trochanter.  The fascia lata was then closed with interrupted 0 Vicryl suture. The subcutaneous tissues were closed with 2-0 Vicryl and the skin approximated with staples.   The patient was then placed supine on the operative table. Leg lengths were checked clinically and found to be equivalent. An abduction pillow was placed between the lower extremities. The patient was then transferred to a hospital bed and brought to the PACU in stable condition. I was scrubbed and present the entire case and all sharp and instrument counts were correct at the conclusion of the case. I spoke with the patient's son in the postop consultation room to  let him know the case was completed without complication patient was stable in recovery room.   Timoteo Gaul, MD Orthopedic Surgeon

## 2017-04-21 NOTE — Progress Notes (Signed)
[facility] NAME: Samantha Mcmillan    MR#:  [id number]  DATE OF BIRTH:  [birth year]  SUBJECTIVE:   Patient here after a fall and noted to have a left hip fracture. Seen by cardiology and cleared from the cardiac perspective for surgery. Patient is likely a low to moderate risk for surgery. No contraindications surgery. Patient's family is at bedside, she still having left hip pain.  REVIEW OF SYSTEMS:    Review of Systems  Constitutional: Negative for chills and fever.  HENT: Negative for congestion and tinnitus.   Eyes: Negative for blurred vision and double vision.  Respiratory: Negative for cough, shortness of breath and wheezing.   Cardiovascular: Negative for chest pain, orthopnea and PND.  Gastrointestinal: Negative for abdominal pain, diarrhea, nausea and vomiting.  Genitourinary: Negative for dysuria and hematuria.  Musculoskeletal: Positive for falls and joint pain (Left Hip).  Neurological: Negative for dizziness, sensory change and focal weakness.  All other systems reviewed and are negative.   Nutrition: NPO for surgery Tolerating Diet: No Tolerating PT: Await Eval post surgery  DRUG ALLERGIES:   Allergies  Allergen Reactions  . Iodine Shortness Of Breath  . Latex Anaphylaxis  . Other Rash, Other (See Comments), Shortness Of Breath, Anaphylaxis and Swelling    Pt states that she is allergic to Darvocet.   . Oxycodone Shortness Of Breath, Rash and Swelling    Other reaction(s): Other (See Comments) wheezing  . Oxycodone-Acetaminophen Other (See Comments), Rash, Swelling and Shortness Of Breath    wheezing wheezing  . Shellfish-Derived Products Anaphylaxis, Rash, Shortness Of Breath and Swelling    Throat closes.  . Amitriptyline Other (See Comments)    Other reaction(s): Other (See Comments) Mental changes Other reaction(s): Other (See Comments) Altered mental status, agitation Reaction:  Mental changes   .  Fish-Derived Products Other (See Comments) and Swelling    Other reaction(s): Other (See Comments) "respiratory distress and wheezing" "respiratory distress and wheezing"  . Tape Other (See Comments)    Other reaction(s): Other (See Comments) Pulls skin off Pt states that it pulls her skin off.  Pt states that it pulls her skin off.   . Wellbutrin [Bupropion] Other (See Comments)    Other reaction(s): Other (See Comments) Mental changes Reaction:  Mental changes   . Augmentin [Amoxicillin-Pot Clavulanate] Rash and Other (See Comments)    Has patient had a PCN reaction causing immediate rash, facial/tongue/throat swelling, SOB or lightheadedness with hypotension: No Has patient had a PCN reaction causing severe rash involving mucus membranes or skin necrosis: No Has patient had a PCN reaction that required hospitalization No Has patient had a PCN reaction occurring within the last 10 years: No If all of the above answers are "NO", then may proceed with Cephalosporin use.  . Codeine Rash  . Cortisone Rash  . Diphenhydramine Rash  . Hydrocodone-Acetaminophen Rash    dizzy  . Vicodin [Hydrocodone-Acetaminophen] Rash and Other (See Comments)    Reaction:  Dizziness      VITALS:  Blood pressure (!) 146/109, pulse 88, temperature (!) 97.4 F (36.3 C), temperature source Tympanic, resp. rate (!) 22, height 5\' 4"  (1.626 m), weight 70.8 kg (156 lb), SpO2 94 %.  PHYSICAL EXAMINATION:   Physical Exam  GENERAL:  62 y.o.-year-old patient lying in bed in pain and some mild distress.   EYES: Pupils equal, round, reactive to light and accommodation. No scleral icterus. Extraocular muscles intact.  HEENT: Head atraumatic,  normocephalic. Oropharynx and nasopharynx clear.  NECK:  Supple, no jugular venous distention. No thyroid enlargement, no tenderness.  LUNGS: Normal breath sounds bilaterally, minimal wheezing b/l, rales, rhonchi. No use of accessory muscles of respiration.  CARDIOVASCULAR:  S1, S2 normal. No murmurs, rubs, or gallops.  ABDOMEN: Soft, nontender, nondistended. Bowel sounds present. No organomegaly or mass.  EXTREMITIES: No cyanosis, clubbing or edema b/l.  Left lower extremity is externally rotated and shortened due to the fracture.  NEUROLOGIC: Cranial nerves II through XII are intact. No focal Motor or sensory deficits b/l.   PSYCHIATRIC: The patient is alert and oriented x 3.  SKIN: No obvious rash, lesion, or ulcer.    LABORATORY PANEL:   CBC Recent Labs  Lab 04/12/2017 0421  WBC 5.0  HGB 10.1*  HCT 30.1*  PLT 126*   ------------------------------------------------------------------------------------------------------------------  Chemistries  Recent Labs  Lab 04/15/2017 2330 03/28/2017 0421  NA 131* 130*  K 3.9 3.9  CL 91* 93*  CO2 29 29  GLUCOSE 115* 135*  BUN 52* 47*  CREATININE 2.03* 1.82*  CALCIUM 8.1* 7.9*  AST 79*  --   ALT 35  --   ALKPHOS 252*  --   BILITOT 0.7  --    ------------------------------------------------------------------------------------------------------------------  Cardiac Enzymes Recent Labs  Lab 03/26/2017 1018  TROPONINI <0.03   ------------------------------------------------------------------------------------------------------------------  RADIOLOGY:  Dg Chest 1 View  Result Date: 04/02/2017 CLINICAL DATA:  62 year old female with fall and left hip pain. Preop evaluation radiograph. EXAM: CHEST 1 VIEW COMPARISON:  Chest radiograph dated 12/27/2015 FINDINGS: The lungs are clear. There is no pleural effusion or pneumothorax. The cardiac silhouette is within normal limits. Partially visualized lower cervical fusion hardware. No acute osseous pathology. IMPRESSION: No active disease. Electronically Signed   By: Anner Crete M.D.   On: 04/04/2017 21:14   Ct Head Wo Contrast  Result Date: 04/03/2017 CLINICAL DATA:  Fall. EXAM: CT HEAD WITHOUT CONTRAST CT CERVICAL SPINE WITHOUT CONTRAST TECHNIQUE:  Multidetector CT imaging of the head and cervical spine was performed following the standard protocol without intravenous contrast. Multiplanar CT image reconstructions of the cervical spine were also generated. COMPARISON:  10/31/2015 FINDINGS: CT HEAD FINDINGS Brain: There is atrophy and chronic small vessel disease changes. No acute intracranial abnormality. Specifically, no hemorrhage, hydrocephalus, mass lesion, acute infarction, or significant intracranial injury. Vascular: No hyperdense vessel or unexpected calcification. Skull: No acute calvarial abnormality. Sinuses/Orbits: Visualized paranasal sinuses and mastoids clear. Orbital soft tissues unremarkable. Other: None CT CERVICAL SPINE FINDINGS Alignment: Normal Skull base and vertebrae: No fracture. Prior posterior fusion from C3-C6. 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 Compression fracture involving the C7 vertebral body is again noted as seen on prior study from 2017, slightly progressed in the degree of collapse. No acute fracture. Soft tissues and spinal canal: Prevertebral soft tissues are normal. No epidural or paraspinal hematoma. Disc levels: Congenital fusion across C5-6. Remainder the disc spaces are maintained. Upper chest: Emphysema and biapical scarring. Other: Carotid artery calcifications.  No acute findings. IMPRESSION: No acute intracranial abnormality. Atrophy, chronic microvascular disease. 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 Posterior fusion changes from C3-C6. No acute bony abnormality. Old C7 compression fracture, progressed since 2017. Electronically Signed   By: Rolm Baptise M.D.   On: 03/28/2017 21:15   Ct Cervical Spine Wo Contrast  Result Date: 04/01/2017 CLINICAL DATA:  Fall. EXAM: CT HEAD WITHOUT CONTRAST CT CERVICAL SPINE WITHOUT CONTRAST TECHNIQUE: Multidetector CT imaging of the head and cervical spine was performed following the standard protocol without intravenous contrast.  Multiplanar CT image reconstructions of the cervical spine were  also generated. COMPARISON:  10/31/2015 FINDINGS: CT HEAD FINDINGS Brain: There is atrophy and chronic small vessel disease changes. No acute intracranial abnormality. Specifically, no hemorrhage, hydrocephalus, mass lesion, acute infarction, or significant intracranial injury. Vascular: No hyperdense vessel or unexpected calcification. Skull: No acute calvarial abnormality. Sinuses/Orbits: Visualized paranasal sinuses and mastoids clear. Orbital soft tissues unremarkable. Other: None CT CERVICAL SPINE FINDINGS Alignment: Normal Skull base and vertebrae: No fracture. Prior posterior fusion from C3-C6. 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 Compression fracture involving the C7 vertebral body is again noted as seen on prior study from 2017, slightly progressed in the degree of collapse. No acute fracture. Soft tissues and spinal canal: Prevertebral soft tissues are normal. No epidural or paraspinal hematoma. Disc levels: Congenital fusion across C5-6. Remainder the disc spaces are maintained. Upper chest: Emphysema and biapical scarring. Other: Carotid artery calcifications.  No acute findings. IMPRESSION: No acute intracranial abnormality. Atrophy, chronic microvascular disease. 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 Posterior fusion changes from C3-C6. No acute bony abnormality. Old C7 compression fracture, progressed since 2017. Electronically Signed   By: Rolm Baptise M.D.   On: 04/22/2017 21:15   Dg Hip Unilat W Or Wo Pelvis 2-3 Views Left  Result Date: 04/09/2017 CLINICAL DATA:  The patient suffered a fall today. Left hip pain. Initial encounter. EXAM: DG HIP (WITH OR WITHOUT PELVIS) 2-3V LEFT COMPARISON:  None. FINDINGS: The patient has an acute subcapital fracture of the left hip. No other acute abnormality is identified. IMPRESSION: Acute subcapital fracture left hip. Electronically Signed   By: Inge Rise M.D.   On: 04/19/2017 21:11     ASSESSMENT AND PLAN:   63 yo female w/ hx of DM, HTN, Hyperlipidemia, hx of  Cirrhosis, Chronic Bronchitis, Anxiety, Anemia, GERD, Depression, PTSD, Chronic Diastolic CHF, hx of Panic Attack who presented to the hospital due to a fall and noted to have a Left Hip Fracture.   1. S/p Fall and Left Hip Fracture - pt. Is low to moderate risk for non-cardiac surgery.   -Patient has been cleared from the medical perspective and cardiology perspective for surgery and is going to the OR later today. Continue further care as per orthopedic surgery  2. History of chronic diastolic CHF-patient clinically is not in congestive heart failure. -Continue Aldactone, metoprolol  3. History of gout-continue allopurinol  4. Hyperlipidemia-continue atorvastatin.  5. Diabetes type 2 without complication-continue sliding scale insulin.  6. Hypothyroidism-continue Synthroid.  7. History of PTSD-continue Lamictal.  8. Essential hypertension-continue metoprolol.  9. GERD-continue Protonix.  10. Depression-continue Zoloft.  All the records are reviewed and case discussed with Care Management/Social Worker. Management plans discussed with the patient, family and they are in agreement.  CODE STATUS: Full code  DVT Prophylaxis: Lovenox   TOTAL TIME TAKING CARE OF THIS PATIENT: 30 minutes.   POSSIBLE D/C IN 2-3 DAYS, DEPENDING ON CLINICAL CONDITION.   Henreitta Leber M.D on 03/28/2017 at 3:38 PM  Between 7am to 6pm - Pager - [phone]  After 6pm go to [email or web address] - Proofreader  [facility]  Office  [phone]  CC: Primary care physician; Casilda Carls, MD

## 2017-04-21 NOTE — Clinical Social Work Placement (Signed)
   CLINICAL SOCIAL WORK PLACEMENT  NOTE  Date:  03/31/2017  Patient Details  Name: Samantha Mcmillan MRN: [id number] Date of Birth: 1955-04-24  Clinical Social Work is seeking post-discharge placement for this patient at the [place] level of care (*CSW will initial, date and re-position this form in  chart as items are completed):  Yes   Patient/family provided with [facility] Work Department's list of facilities offering this level of care within the geographic area requested by the patient (or if unable, by the patient's family).  Yes   Patient/family informed of their freedom to choose among providers that offer the needed level of care, that participate in Medicare, Medicaid or managed care program needed by the patient, have an available bed and are willing to accept the patient.  Yes   Patient/family informed of [facility]'s ownership interest in [facility] and [facility], as well as of the fact that they are under no obligation to receive care at these facilities.  PASRR submitted to EDS on       PASRR number received on       Existing PASRR number confirmed on 03/29/2017     FL2 transmitted to all facilities in geographic area requested by pt/family on 03/27/2017     FL2 transmitted to all facilities within larger geographic area on       Patient informed that his/her managed care company has contracts with or will negotiate with certain facilities, including the following:            Patient/family informed of bed offers received.  Patient chooses bed at       Physician recommends and patient chooses bed at      Patient to be transferred to   on  .  Patient to be transferred to facility by       Patient family notified on   of transfer.  Name of family member notified:        PHYSICIAN       Additional Comment:    _______________________________________________ Chealsey Miyamoto, [name], LCSW 04/15/2017, 3:34 PM

## 2017-04-21 NOTE — Progress Notes (Signed)
Pt off unit for procedure.

## 2017-04-21 NOTE — Progress Notes (Signed)
Chaplain offered prayer and emotional support her son.

## 2017-04-21 NOTE — Transfer of Care (Signed)
Immediate Anesthesia Transfer of Care Note  Patient: Samantha Mcmillan  Procedure(s) Performed: ARTHROPLASTY BIPOLAR HIP (HEMIARTHROPLASTY) (Left Hip)  Patient Location: PACU  Anesthesia Type:Spinal  Level of Consciousness: awake  Airway & Oxygen Therapy: Patient Spontanous Breathing and Patient connected to face mask oxygen  Post-op Assessment: Report given to RN and Post -op Vital signs reviewed and stable  Post vital signs: Reviewed and stable  Last Vitals:  Vitals:   04/20/2017 1705 04/09/2017 1706  BP: (!) 71/44 (!) 67/45  Pulse:    Resp: 18 20  Temp: 36.7 C   SpO2: (P) 99%     Last Pain:  Vitals:   04/08/2017 1309  TempSrc: Tympanic  PainSc: 8       Patients Stated Pain Goal: 1 (59/74/71 8550)  Complications: No apparent anesthesia complications

## 2017-04-21 NOTE — Anesthesia Post-op Follow-up Note (Signed)
Anesthesia QCDR form completed.        

## 2017-04-21 NOTE — NC FL2 (Signed)
[place] LEVEL OF CARE SCREENING TOOL     IDENTIFICATION  Patient Name: Samantha Mcmillan Birthdate: [birth year] Sex: female Admission Date (Current Location): 04/21/2017  [place] and [place] Number:  Engineering geologist and Address:  [facility], [address], [place], [place] [address]      Provider Number: [id number]  Attending Physician Name and Address:  Henreitta Leber, MD  Relative Name and Phone Number:       Current Level of Care: Hospital Recommended Level of Care: [place] Prior Approval Number:    Date Approved/Denied:   PASRR Number: ([id number] )  Discharge Plan: SNF    Current Diagnoses: Patient Active Problem List   Diagnosis Date Noted  . Hip fracture ([place]) 04/21/2017  . History of colonic polyps   . Benign neoplasm of transverse colon   . Benign neoplasm of descending colon   . Gastritis without bleeding   . Venous stasis 08/10/2016  . Hypokalemia 12/27/2015  . Acute on chronic diastolic heart failure ([place]) 12/27/2015  . Abdominal pain 11/13/2015  . Chronic pain 11/13/2015  . Closed nondisplaced fracture of seventh cervical vertebra with routine healing 11/13/2015  . Essential hypertension 11/13/2015  . Gallstone pancreatitis 11/13/2015  . Jaundice 11/13/2015  . Closed nondisplaced fracture of seventh cervical vertebra ([place]) 11/01/2015  . ARF (acute renal failure) ([place]) 07/06/2015  . Hyperkalemia 07/06/2015  . Dehydration 07/06/2015  . Personal history of tobacco use, presenting hazards to health 06/21/2015  . Bilateral carotid artery stenosis 06/04/2015  . Mixed hyperlipidemia 06/04/2015  . Fall 04/16/2015  . Bacterial skin infection of leg 10/24/2014  . Edema leg 10/08/2014  . Hypertensive pulmonary vascular disease ([place]) 10/08/2014  . Mixed obsessional thoughts and acts 09/27/2014  . Chronic diastolic heart failure ([place]) 09/21/2014  . Tobacco use 09/21/2014  . Thrombocytopenia  ([place]) 09/19/2014  . Leukopenia 09/19/2014  . Stasis dermatitis of left lower extremity with venous ulcer due to chronic peripheral venous hypertension ([place]) 08/15/2014  . IDA (iron deficiency anemia) 08/14/2014  . Ascites 07/25/2014  . Cellulitis and abscess of lower extremity 07/07/2014  . Encephalopathy, hepatic ([place]) 05/18/2014  . Polypharmacy 05/18/2014  . Failure to thrive in adult 05/16/2014  . Hepatic cirrhosis ([place]) 05/16/2014  . Protein-calorie malnutrition, severe ([place]) 05/16/2014  . Advanced COPD ([place]) 05/16/2014  . Acute on chronic diastolic CHF (congestive heart failure) ([place]) 05/16/2014  . Chronic back pain 05/16/2014  . Weakness 05/15/2014  . Hyponatremia 05/15/2014  . HCAP (healthcare-associated pneumonia) 05/09/2014  . Urinary retention 05/09/2014  . DM2 (diabetes mellitus, type 2) ([place]) 05/09/2014  . Hypothyroidism 05/05/2014  . Cervical stenosis of spine 03/11/2013  . Major depressive disorder, recurrent episode, severe ([place]) 12/05/2012  . Posttraumatic stress disorder 12/05/2012    Orientation RESPIRATION BLADDER Height & Weight     Self, Time, Situation, Place  O2(2 Liters Oxygen. ) Continent Weight: 156 lb (70.8 kg) Height:  5\' 4"  (162.6 cm)  BEHAVIORAL SYMPTOMS/MOOD NEUROLOGICAL BOWEL NUTRITION STATUS      Continent Diet(Regular Diet. )  AMBULATORY STATUS COMMUNICATION OF NEEDS Skin   Extensive Assist Verbally Surgical wounds                       Personal Care Assistance Level of Assistance  Bathing, Feeding, Dressing Bathing Assistance: Limited assistance Feeding assistance: Independent Dressing Assistance: Limited assistance     Functional Limitations Info  Sight, Hearing, Speech Sight Info: Adequate Hearing Info: Adequate Speech  Info: Adequate    SPECIAL CARE FACTORS FREQUENCY  PT (By licensed PT), OT (By licensed OT)     PT Frequency: (5) OT Frequency: (5)            Contractures      Additional Factors Info  Code  Status, Allergies Code Status Info: (Full Code. ) Allergies Info: (Iodine, Latex, Other, Oxycodone, Oxycodone-acetaminophen, Shellfish-derived Products, Amitriptyline, Fish-derived Products, Tape, Wellbutrin Bupropion, Augmentin Amoxicillin-pot Clavulanate, Codeine, Cortisone, Diphenhydramine, Hydrocodone-acetaminophen,)           Current Medications (03/31/2017):  This is the current hospital active medication list Current Facility-Administered Medications  Medication Dose Route Frequency Provider Last Rate Last Dose  . 0.9 %  sodium chloride infusion   Intravenous Continuous Saundra Shelling, MD 75 mL/hr at 04/15/2017 0011    . [MAR Hold] acetaminophen (TYLENOL) tablet 650 mg  650 mg Oral Q6H PRN Saundra Shelling, MD       Or  . Doug Sou Hold] acetaminophen (TYLENOL) suppository 650 mg  650 mg Rectal Q6H PRN Pyreddy, Reatha Harps, MD      . Doug Sou Hold] albuterol (PROVENTIL) (2.5 MG/3ML) 0.083% nebulizer solution 2.5 mg  2.5 mg Inhalation Q6H PRN Saundra Shelling, MD      . Doug Sou Hold] allopurinol (ZYLOPRIM) tablet 100 mg  100 mg Oral Daily Pyreddy, Reatha Harps, MD      . Doug Sou Hold] ARIPiprazole (ABILIFY) tablet 5 mg  5 mg Oral QHS Pyreddy, Reatha Harps, MD      . Doug Sou Hold] atorvastatin (LIPITOR) tablet 20 mg  20 mg Oral Daily Pyreddy, Reatha Harps, MD      . Doug Sou Hold] calcium-vitamin D (OSCAL WITH D) 500-200 MG-UNIT per tablet   Oral Daily Pyreddy, Pavan, MD      . clindamycin (CLEOCIN) IVPB 600 mg  600 mg Intravenous Once [place], MD   600 mg at 04/05/2017 1503  . [MAR Hold] Ferrous Fumarate (HEMOCYTE - 106 mg FE) tablet 106 mg of iron  1 tablet Oral BID PC Pyreddy, Pavan, MD       And  . Doug Sou Hold] iron polysaccharides (NIFEREX) capsule 150 mg  150 mg Oral BID PC Pyreddy, Reatha Harps, MD      . Doug Sou Hold] Influenza vac split quadrivalent PF (FLUARIX) injection 0.5 mL  0.5 mL Intramuscular Tomorrow-1000 Pyreddy, Reatha Harps, MD      . Doug Sou Hold] insulin aspart (novoLOG) injection 0-15 Units  0-15 Units Subcutaneous TID WC Pyreddy,  Reatha Harps, MD      . Doug Sou Hold] insulin aspart (novoLOG) injection 0-5 Units  0-5 Units Subcutaneous QHS Pyreddy, Reatha Harps, MD      . Doug Sou Hold] ipratropium-albuterol (DUONEB) 0.5-2.5 (3) MG/3ML nebulizer solution 3 mL  3 mL Nebulization Q4H PRN Pyreddy, Reatha Harps, MD      . Doug Sou Hold] lamoTRIgine (LAMICTAL) tablet 150 mg  150 mg Oral QHS Pyreddy, Reatha Harps, MD      . Doug Sou Hold] levothyroxine (SYNTHROID, LEVOTHROID) tablet 88 mcg  88 mcg Oral QAC breakfast Pyreddy, Reatha Harps, MD      . Doug Sou Hold] magnesium oxide (MAG-OX) tablet 400 mg  400 mg Oral BID Saundra Shelling, MD      . Doug Sou Hold] metoprolol tartrate (LOPRESSOR) tablet 50 mg  50 mg Oral BID Saundra Shelling, MD   50 mg at 04/09/2017 1142  . [MAR Hold] mometasone-formoterol (DULERA) 100-5 MCG/ACT inhaler 2 puff  2 puff Inhalation BID Pyreddy, Reatha Harps, MD      . Mikaela.Ping Hold] morphine 4 MG/ML injection 4 mg  4 mg Intravenous  Q4H PRN Rudene Re, MD   4 mg at 04/18/2017 1884  . [MAR Hold] multivitamin with minerals tablet 1 tablet  1 tablet Oral Daily Pyreddy, Pavan, MD      . Doug Sou Hold] ondansetron (ZOFRAN) tablet 4 mg  4 mg Oral Q6H PRN Saundra Shelling, MD       Or  . Doug Sou Hold] ondansetron (ZOFRAN) injection 4 mg  4 mg Intravenous Q6H PRN Pyreddy, Reatha Harps, MD      . Doug Sou Hold] pantoprazole (PROTONIX) EC tablet 40 mg  40 mg Oral Daily Pyreddy, Reatha Harps, MD      . Doug Sou Hold] senna-docusate (Senokot-S) tablet 1 tablet  1 tablet Oral QHS PRN Saundra Shelling, MD      . Doug Sou Hold] sertraline (ZOLOFT) tablet 200 mg  200 mg Oral Daily Pyreddy, Pavan, MD      . Doug Sou Hold] spironolactone (ALDACTONE) tablet 25 mg  25 mg Oral Daily Pyreddy, Reatha Harps, MD       Facility-Administered Medications Ordered in Other Encounters  Medication Dose Route Frequency Provider Last Rate Last Dose  . bupivacaine (MARCAINE) 0.5 % injection   Intrathecal Anesthesia Intra-op Rolla Plate, CRNA   3 mL at 04/10/2017 1500  . fentaNYL (SUBLIMAZE) injection   Intravenous Anesthesia Intra-op Rolla Plate, CRNA   50 mcg at 03/26/2017 1445  . glycopyrrolate (ROBINUL) injection   Intravenous Anesthesia Intra-op Rolla Plate, CRNA   0.2 mg at 03/30/2017 1437  . ketamine (KETALAR) injection    Anesthesia Intra-op Rolla Plate, CRNA   25 mg at 04/04/2017 1445  . lidocaine (XYLOCAINE) 2 % injection   Other Anesthesia Intra-op Rolla Plate, CRNA   50 mg at 04/19/2017 1500  . midazolam (VERSED) 5 MG/5ML injection   Intravenous Anesthesia Intra-op Rolla Plate, CRNA   2 mg at 04/15/2017 1437  . phenylephrine (NEO-SYNEPHRINE) injection   Intravenous Anesthesia Intra-op Rolla Plate, CRNA   100 mcg at 04/22/2017 1520  . propofol (DIPRIVAN) 10 mg/mL bolus/IV push   Intravenous Anesthesia Intra-op Rolla Plate, CRNA   20 mg at 04/17/2017 1456  . propofol (DIPRIVAN) 500 MG/50ML infusion   Intravenous Continuous PRN Rolla Plate, CRNA 10.6 mL/hr at 04/20/2017 1500 25 mcg/kg/min at 04/16/2017 1500     Discharge Medications: Please see discharge summary for a list of discharge medications.  Relevant Imaging Results:  Relevant Lab Results:   Additional Information (SSN: [id number])  Ryeleigh Santore, [name], LCSW

## 2017-04-21 NOTE — [place] Notes (Signed)
CRITICAL VALUE ALERT  Critical Value:  Blood Sugar of 38  Date & Time Notied: 21:45 04/17/2017  Provider Notified: Yes Dr. Duane Boston  Orders Received/Actions taken: Dextrose 50 25 ML Blood Sugar is now 127.

## 2017-04-21 NOTE — Consult Note (Signed)
ORTHOPAEDIC CONSULTATION  REQUESTING PHYSICIAN: Henreitta Leber, MD  Chief Complaint: Left Hip pain status post fall  HPI: Samantha Mcmillan is a 62 y.o. female who complains of left hip pain after mechanical fall at home yesterday.  Patient was brought to the [facility] emergency department. X-rays demonstrated that she had a displaced left femoral neck hip fracture. Patient was admitted to the hospitalist service for preoperative evaluation. Orthopedics is consulted for management of the left displaced femoral neck hip fracture.  Past Medical History:  Diagnosis Date  . Acute on chronic diastolic CHF (congestive heart failure) ([place]) 05/16/2014  . Anemia   . Anxiety   . Arthritis    "some in my feet" (05/04/2014)  . Asthma   . Chronic bronchitis ([place])    "get it pretty much q yr"   . Chronic lower back pain   . Chronic respiratory failure ([place])   . Cirrhosis of liver ([place])   . Cirrhosis of liver ([place])   . Colon polyp   . COPD (chronic obstructive pulmonary disease) ([place])   . Depression   . Family history of adverse reaction to anesthesia    "my sister doesn't wake up good when she's put deep to sleep"  . GERD (gastroesophageal reflux disease)   . Headache    "more than a couple times/wk" (05/04/2014)  . Heart murmur   . High cholesterol   . History of blood transfusion    "related to my anemia"  . Hypertension   . IBS (irritable bowel syndrome)   . IDA (iron deficiency anemia) 08/14/2014  . Iron deficiency anemia   . OCD (obsessive compulsive disorder)   . On home oxygen therapy    "2L; 24/7" (05/04/2014)  . Panic attack   . Personal history of tobacco use, presenting hazards to health 06/21/2015  . Pneumonia "several times"  . PTSD (post-traumatic stress disorder)   . Sleep apnea    "can't tolerate CPAP" (05/04/2014)  . Type II diabetes mellitus ([place])    Past Surgical History:  Procedure Laterality Date  . BACK SURGERY    . CATARACT EXTRACTION W/ INTRAOCULAR  LENS  IMPLANT, BILATERAL Bilateral 2005  . CESAREAN SECTION  1979  . COLONOSCOPY  2014  . COLONOSCOPY WITH PROPOFOL N/A 12/08/2016   Procedure: COLONOSCOPY WITH PROPOFOL;  Surgeon: Lucilla Lame, MD;  Location: [facility] ENDOSCOPY;  Service: Endoscopy;  Laterality: N/A;  . DILATION AND CURETTAGE OF UTERUS    . ESOPHAGOGASTRODUODENOSCOPY (EGD) WITH PROPOFOL N/A 12/08/2016   Procedure: ESOPHAGOGASTRODUODENOSCOPY (EGD) WITH PROPOFOL;  Surgeon: Lucilla Lame, MD;  Location: [facility] ENDOSCOPY;  Service: Endoscopy;  Laterality: N/A;  . INCISION AND DRAINAGE OF WOUND Right 2009   "leg mauled  by dog"  . PERIPHERAL VASCULAR CATHETERIZATION N/A 08/16/2014   Procedure: PICC Line Insertion;  Surgeon: Algernon Huxley, MD;  Location: [facility];  Service: Cardiovascular;  Laterality: N/A;  . POSTERIOR FUSION CERVICAL SPINE  2015   "rebuilt 3 of my neck vertebrae"  . TUBAL LIGATION  1979  . UPPER GASTROINTESTINAL ENDOSCOPY     Social History   Socioeconomic History  . Marital status: Divorced    Spouse name: None  . Number of children: None  . Years of education: None  . Highest education level: None  Social Needs  . Financial resource strain: None  . Food insecurity - worry: None  . Food insecurity - inability: None  . Transportation needs - medical: None  . Transportation needs - non-medical:  None  Occupational History  . Occupation: disabled  Tobacco Use  . Smoking status: Current Every Day Smoker    Packs/day: 0.50    Years: 48.00    Pack years: 24.00    Types: Cigarettes  . Smokeless tobacco: Never Used  Substance and Sexual Activity  . Alcohol use: No    Alcohol/week: 0.0 oz  . Drug use: No  . Sexual activity: Not Currently  Other Topics Concern  . None  Social History Narrative  . None   Family History  Problem Relation Age of Onset  . Cancer Father        pancreatic  . Cancer Brother   . Breast cancer Maternal Aunt    Allergies  Allergen Reactions  . Iodine Shortness  Of Breath  . Latex Anaphylaxis  . Other Rash, Other (See Comments), Shortness Of Breath, Anaphylaxis and Swelling    Pt states that she is allergic to Darvocet.   . Oxycodone Shortness Of Breath, Rash and Swelling    Other reaction(s): Other (See Comments) wheezing  . Oxycodone-Acetaminophen Other (See Comments), Rash, Swelling and Shortness Of Breath    wheezing wheezing  . Shellfish-Derived Products Anaphylaxis, Rash, Shortness Of Breath and Swelling    Throat closes.  . Amitriptyline Other (See Comments)    Other reaction(s): Other (See Comments) Mental changes Other reaction(s): Other (See Comments) Altered mental status, agitation Reaction:  Mental changes   . Fish-Derived Products Other (See Comments) and Swelling    Other reaction(s): Other (See Comments) "respiratory distress and wheezing" "respiratory distress and wheezing"  . Tape Other (See Comments)    Other reaction(s): Other (See Comments) Pulls skin off Pt states that it pulls her skin off.  Pt states that it pulls her skin off.   . Wellbutrin [Bupropion] Other (See Comments)    Other reaction(s): Other (See Comments) Mental changes Reaction:  Mental changes   . Augmentin [Amoxicillin-Pot Clavulanate] Rash and Other (See Comments)    Has patient had a PCN reaction causing immediate rash, facial/tongue/throat swelling, SOB or lightheadedness with hypotension: No Has patient had a PCN reaction causing severe rash involving mucus membranes or skin necrosis: No Has patient had a PCN reaction that required hospitalization No Has patient had a PCN reaction occurring within the last 10 years: No If all of the above answers are "NO", then may proceed with Cephalosporin use.  . Codeine Rash  . Cortisone Rash  . Diphenhydramine Rash  . Hydrocodone-Acetaminophen Rash    dizzy  . Vicodin [Hydrocodone-Acetaminophen] Rash and Other (See Comments)    Reaction:  Dizziness     Prior to Admission medications   Medication  Sig Start Date End Date Taking? Authorizing Provider  albuterol (PROAIR HFA) 108 (90 Base) MCG/ACT inhaler ProAir HFA 90 mcg/actuation aerosol inhaler   Yes [provider]  allopurinol (ZYLOPRIM) 100 MG tablet Take 100 mg by mouth daily.    Yes [provider]  ARIPiprazole (ABILIFY) 5 MG tablet Take 5 mg by mouth at bedtime.    Yes [provider]  atorvastatin (LIPITOR) 20 MG tablet Take 20 mg by mouth daily.   Yes [provider]  budesonide-formoterol (SYMBICORT) 80-4.5 MCG/ACT inhaler Inhale 2 puffs into the lungs 2 (two) times daily.    Yes [provider]  bumetanide (BUMEX) 1 MG tablet Take 2 mg by mouth 2 (two) times daily.  07/04/15  Yes [provider]  Calcium Carbonate-Vitamin D (CALTRATE 600+D PO) Take 1 tablet by mouth daily.  Yes [provider]  ferrous fumarate-iron polysaccharide complex (TANDEM) 162-115.2 MG CAPS capsule Take 1 capsule by mouth 2 (two) times daily after a meal. 12/10/16  Yes Cammie Sickle, MD  HYDROmorphone (DILAUDID) 2 MG tablet Take 1 tablet (2 mg total) by mouth every 6 (six) hours as needed for severe pain. Patient taking differently: Take 2 mg by mouth every 8 (eight) hours as needed for severe pain.  07/09/15  Yes Mody, Ulice Bold, MD  insulin glargine (LANTUS) 100 UNIT/ML injection Inject 30 Units into the skin every morning.    Yes [provider]  ipratropium-albuterol (DUONEB) 0.5-2.5 (3) MG/3ML SOLN Take 3 mLs by nebulization every 4 (four) hours as needed.   Yes [provider]  lamoTRIgine (LAMICTAL) 150 MG tablet Take 150 mg by mouth at bedtime.    Yes [provider]  levothyroxine (SYNTHROID, LEVOTHROID) 88 MCG tablet Take 88 mcg by mouth daily before breakfast.    Yes [provider]  linaclotide (LINZESS) 290 MCG CAPS capsule Take 1 capsule (290 mcg total) daily before breakfast by mouth. 01/01/17  Yes Lucilla Lame, MD  lisinopril  (PRINIVIL,ZESTRIL) 20 MG tablet Take 20 mg by mouth daily.   Yes [provider]  magnesium oxide (MAG-OX) 400 MG tablet Take 1 tablet by mouth 2 (two) times daily.    Yes [provider]  metolazone (ZAROXOLYN) 5 MG tablet Take 5 mg by mouth daily.   Yes [provider]  metoprolol (LOPRESSOR) 50 MG tablet Take 50 mg by mouth 2 (two) times daily.   Yes [provider]  Multiple Vitamin (MULTIVITAMIN WITH MINERALS) TABS tablet Take 1 tablet by mouth daily. 05/18/14  Yes Rama, Venetia Maxon, MD  pantoprazole (PROTONIX) 40 MG tablet Take 40 mg by mouth daily.   Yes [provider]  sertraline (ZOLOFT) 100 MG tablet Take 200 mg by mouth daily.    Yes [provider]  spironolactone (ALDACTONE) 25 MG tablet Take 25 mg by mouth daily.   Yes [provider]  albuterol (PROVENTIL) (2.5 MG/3ML) 0.083% nebulizer solution Take 2.5 mg by nebulization every 6 (six) hours as needed for wheezing or shortness of breath.    [provider]  spironolactone (ALDACTONE) 50 MG tablet Take 1 tablet (50 mg total) by mouth daily. 03/07/16 03/07/17  Earleen Newport, MD   Dg Chest 1 View  Result Date: 04/11/2017 CLINICAL DATA:  62 year old female with fall and left hip pain. Preop evaluation radiograph. EXAM: CHEST 1 VIEW COMPARISON:  Chest radiograph dated 12/27/2015 FINDINGS: The lungs are clear. There is no pleural effusion or pneumothorax. The cardiac silhouette is within normal limits. Partially visualized lower cervical fusion hardware. No acute osseous pathology. IMPRESSION: No active disease. Electronically Signed   By: Anner Crete M.D.   On: 04/11/2017 21:14   Ct Head Wo Contrast  Result Date: 03/26/2017 CLINICAL DATA:  Fall. EXAM: CT HEAD WITHOUT CONTRAST CT CERVICAL SPINE WITHOUT CONTRAST TECHNIQUE: Multidetector CT imaging of the head and cervical spine was performed following the standard protocol without intravenous contrast.  Multiplanar CT image reconstructions of the cervical spine were also generated. COMPARISON:  10/31/2015 FINDINGS: CT HEAD FINDINGS Brain: There is atrophy and chronic small vessel disease changes. No acute intracranial abnormality. Specifically, no hemorrhage, hydrocephalus, mass lesion, acute infarction, or significant intracranial injury. Vascular: No hyperdense vessel or unexpected calcification. Skull: No acute calvarial abnormality. Sinuses/Orbits: Visualized paranasal sinuses and mastoids clear. Orbital soft tissues unremarkable. Other: None CT CERVICAL SPINE FINDINGS  Alignment: Normal Skull base and vertebrae: No fracture. Prior posterior fusion from C3-C6. 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 Compression fracture involving the C7 vertebral body is again noted as seen on prior study from 2017, slightly progressed in the degree of collapse. No acute fracture. Soft tissues and spinal canal: Prevertebral soft tissues are normal. No epidural or paraspinal hematoma. Disc levels: Congenital fusion across C5-6. Remainder the disc spaces are maintained. Upper chest: Emphysema and biapical scarring. Other: Carotid artery calcifications.  No acute findings. IMPRESSION: No acute intracranial abnormality. Atrophy, chronic microvascular disease. 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 Posterior fusion changes from C3-C6. No acute bony abnormality. Old C7 compression fracture, progressed since 2017. Electronically Signed   By: Rolm Baptise M.D.   On: 04/13/2017 21:15   Ct Cervical Spine Wo Contrast  Result Date: 04/01/2017 CLINICAL DATA:  Fall. EXAM: CT HEAD WITHOUT CONTRAST CT CERVICAL SPINE WITHOUT CONTRAST TECHNIQUE: Multidetector CT imaging of the head and cervical spine was performed following the standard protocol without intravenous contrast. Multiplanar CT image reconstructions of the cervical spine were also generated. COMPARISON:  10/31/2015 FINDINGS: CT HEAD FINDINGS Brain: There is atrophy and chronic small vessel disease  changes. No acute intracranial abnormality. Specifically, no hemorrhage, hydrocephalus, mass lesion, acute infarction, or significant intracranial injury. Vascular: No hyperdense vessel or unexpected calcification. Skull: No acute calvarial abnormality. Sinuses/Orbits: Visualized paranasal sinuses and mastoids clear. Orbital soft tissues unremarkable. Other: None CT CERVICAL SPINE FINDINGS Alignment: Normal Skull base and vertebrae: No fracture. Prior posterior fusion from C3-C6. 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 Compression fracture involving the C7 vertebral body is again noted as seen on prior study from 2017, slightly progressed in the degree of collapse. No acute fracture. Soft tissues and spinal canal: Prevertebral soft tissues are normal. No epidural or paraspinal hematoma. Disc levels: Congenital fusion across C5-6. Remainder the disc spaces are maintained. Upper chest: Emphysema and biapical scarring. Other: Carotid artery calcifications.  No acute findings. IMPRESSION: No acute intracranial abnormality. Atrophy, chronic microvascular disease. 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 Posterior fusion changes from C3-C6. No acute bony abnormality. Old C7 compression fracture, progressed since 2017. Electronically Signed   By: Rolm Baptise M.D.   On: 04/15/2017 21:15   Dg Hip Unilat W Or Wo Pelvis 2-3 Views Left  Result Date: 03/30/2017 CLINICAL DATA:  The patient suffered a fall today. Left hip pain. Initial encounter. EXAM: DG HIP (WITH OR WITHOUT PELVIS) 2-3V LEFT COMPARISON:  None. FINDINGS: The patient has an acute subcapital fracture of the left hip. No other acute abnormality is identified. IMPRESSION: Acute subcapital fracture left hip. Electronically Signed   By: Inge Rise M.D.   On: 04/11/2017 21:11    Positive ROS: All other systems have been reviewed and were otherwise negative with the exception of those mentioned in the HPI and as above.  Physical Exam: General: Alert, no acute  distress   MUSCULOSKELETAL: Left lower extremity: Patient has shortening and external rotation deformity of her left lower extremity. She has palpable pedal pulses. Patient is quite lethargic and was difficult to assess her motor and sensory function. Patient's skin is intact. There is no erythema or ecchymosis or swelling. Her thigh compartments are soft and compressible.  Assessment: Left displaced femoral neck hip fracture  Plan: Patient has a displaced left femoral neck hip fracture. I met the patient in preoperative area. Her son was at the bedside. I'm recommending a hemiarthroplasty for treatment of this fracture. I explained the details of the operation to the patient's son as well  as postoperative course. I also explained the potential risks of the procedure. He understands the risks include infection, bleeding, nerve or blood vessel injury, persistent pain, leg length discrepancy, change in lower extremity rotation, hardware failure and the need for further surgery including conversion to a total hip arthroplasty. Patient's son also understood the medical risks include DVT and pulmonary embolism, myocardial infarction, stroke, pneumonia, respiratory failure and death. Patient's understood these risks but agreed with the plan for surgery. He is finding for the patient given her lethargy.  Patient has been cleared by the medical service for surgery. I reviewed the patient's labs and radiographic studies in preparation for this case.    Thornton Park, MD    04/07/2017 2:20 PM

## 2017-04-21 NOTE — Anesthesia Preprocedure Evaluation (Signed)
Anesthesia Evaluation  Patient identified by MRN, date of birth, ID band Patient awake    Reviewed: Allergy & Precautions, H&P , NPO status , Patient's Chart, lab work & pertinent test results, reviewed documented beta blocker date and time   Airway Mallampati: II   Neck ROM: full    Dental  (+) Poor Dentition   Pulmonary neg pulmonary ROS, asthma , sleep apnea , pneumonia, COPD, Current Smoker,    Pulmonary exam normal        Cardiovascular Exercise Tolerance: Poor hypertension, On Medications + Peripheral Vascular Disease and +CHF  negative cardio ROS Normal cardiovascular exam+ Valvular Problems/Murmurs  Rhythm:regular Rate:Normal  ------------------------------------------------------------------- LV EF: 55% -   60%  ------------------------------------------------------------------- Indications:      N86.7 Acute Systolic Heart Failure.  ------------------------------------------------------------------- History:   PMH:  Acquired from the patient and from the patient&'s chart.  PMH:  Murmur. COPD. Cirrhosis of Liver. Shortness of Breath. Lower extremity edema. Sleep apnea. Anemia.  Risk factors: Hypertension. Dyslipidemia.  ------------------------------------------------------------------- Study Conclusions  - Left ventricle: The cavity size was normal. Wall thickness was   normal. Systolic function was normal. The estimated ejection   fraction was in the range of 55% to 60%. Wall motion was normal;   there were no regional wall motion abnormalities. Doppler   parameters are consistent with abnormal left ventricular   relaxation (grade 1 diastolic dysfunction). - Aortic valve: There was mild regurgitation. - Left atrium: The atrium was mildly dilated.  Transthoracic echocardiography.  M-mode, complete 2D, spectral Doppler, and color Doppler.  Birthdate:  Patient birthdate: 12/02/1955.  Age:  Patient is 62 yr  old.  Sex:  Gender: female. BMI: 32.6 kg/m^2.  Blood pressure:     132/62  Patient status: Inpatient.  Study date:  Study date: 09/06/2014. Study time: 06:08 PM.  Location:  Bedside.  -------------------------------------------------------------------  ------------------------------------------------------------------- Left ventricle:  The cavity size was normal. Wall thickness was normal. Systolic function was normal. The estimated ejection fraction was in the range of 55% to 60%. Wall motion was normal; there were no regional wall motion abnormalities. Doppler parameters are consistent with abnormal left ventricular relaxation (grade 1 diastolic dysfunction).  ------------------------------------------------------------------- Aortic valve:   Trileaflet; normal thickness leaflets. Mobility was not restricted.  Doppler:  Transvalvular velocity was within the normal range. There was no stenosis. There was mild regurgitation.     Neuro/Psych  Headaches, PSYCHIATRIC DISORDERS Anxiety Depression negative neurological ROS  negative psych ROS   GI/Hepatic negative GI ROS, Neg liver ROS, GERD  Medicated,  Endo/Other  negative endocrine ROSdiabetesHypothyroidism   Renal/GU CRFRenal diseasenegative Renal ROS  negative genitourinary   Musculoskeletal   Abdominal   Peds  Hematology negative hematology ROS (+) anemia ,   Anesthesia Other Findings Past Medical History: 05/16/2014: Acute on chronic diastolic CHF (congestive heart failure)  ([facility]) No date: Anemia No date: Anxiety No date: Arthritis     Comment:  "some in my feet" (05/04/2014) No date: Asthma No date: Chronic bronchitis ([facility])     Comment:  "get it pretty much q yr"  No date: Chronic lower back pain No date: Chronic respiratory failure ([facility]) No date: Cirrhosis of liver ([facility]) No date: Cirrhosis of liver ([facility]) No date: Colon polyp No date: COPD (chronic obstructive pulmonary disease) ([place]) No date:  Depression No date: Family history of adverse reaction to anesthesia     Comment:  "my sister doesn't wake up good when she's put deep to  sleep" No date: GERD (gastroesophageal reflux disease) No date: Headache     Comment:  "more than a couple times/wk" (05/04/2014) No date: Heart murmur No date: High cholesterol No date: History of blood transfusion     Comment:  "related to my anemia" No date: Hypertension No date: IBS (irritable bowel syndrome) 08/14/2014: IDA (iron deficiency anemia) No date: Iron deficiency anemia No date: OCD (obsessive compulsive disorder) No date: On home oxygen therapy     Comment:  "2L; 24/7" (05/04/2014) No date: Panic attack 06/21/2015: Personal history of tobacco use, presenting hazards to  health "several times": Pneumonia No date: PTSD (post-traumatic stress disorder) No date: Sleep apnea     Comment:  "can't tolerate CPAP" (05/04/2014) No date: Type II diabetes mellitus ([place]) Past Surgical History: No date: BACK SURGERY 2005: CATARACT EXTRACTION W/ INTRAOCULAR LENS  IMPLANT, BILATERAL;  Bilateral 1979: CESAREAN SECTION 2014: COLONOSCOPY 12/08/2016: COLONOSCOPY WITH PROPOFOL; N/A     Comment:  Procedure: COLONOSCOPY WITH PROPOFOL;  Surgeon: Lucilla Lame, MD;  Location: [facility] ENDOSCOPY;  Service:               Endoscopy;  Laterality: N/A; No date: DILATION AND CURETTAGE OF UTERUS 12/08/2016: ESOPHAGOGASTRODUODENOSCOPY (EGD) WITH PROPOFOL; N/A     Comment:  Procedure: ESOPHAGOGASTRODUODENOSCOPY (EGD) WITH               PROPOFOL;  Surgeon: Lucilla Lame, MD;  Location: [facility]               ENDOSCOPY;  Service: Endoscopy;  Laterality: N/A; 2009: INCISION AND DRAINAGE OF WOUND; Right     Comment:  "leg mauled  by dog" 08/16/2014: PERIPHERAL VASCULAR CATHETERIZATION; N/A     Comment:  Procedure: PICC Line Insertion;  Surgeon: Algernon Huxley,               MD;  Location: [facility];  Service:                Cardiovascular;  Laterality: N/A; 2015: POSTERIOR FUSION CERVICAL SPINE     Comment:  "rebuilt 3 of my neck vertebrae" 1979: TUBAL LIGATION No date: UPPER GASTROINTESTINAL ENDOSCOPY BMI    Body Mass Index:  26.93 kg/m     Reproductive/Obstetrics negative OB ROS                             Anesthesia Physical Anesthesia Plan  ASA: IV  Anesthesia Plan: Spinal   Post-op Pain Management:    Induction:   PONV Risk Score and Plan:   Airway Management Planned:   Additional Equipment:   Intra-op Plan:   Post-operative Plan:   Informed Consent: I have reviewed the patients History and Physical, chart, labs and discussed the procedure including the risks, benefits and alternatives for the proposed anesthesia with the patient or authorized representative who has indicated his/her understanding and acceptance.   Dental Advisory Given  Plan Discussed with: CRNA  Anesthesia Plan Comments:         Anesthesia Quick Evaluation

## 2017-04-22 ENCOUNTER — Encounter: Payer: Self-pay | Admitting: Orthopedic Surgery

## 2017-04-22 LAB — GLUCOSE, CAPILLARY
GLUCOSE-CAPILLARY: 127 mg/dL — AB (ref 65–99)
GLUCOSE-CAPILLARY: 169 mg/dL — AB (ref 65–99)
Glucose-Capillary: 172 mg/dL — ABNORMAL HIGH (ref 65–99)
Glucose-Capillary: 100 mg/dL — ABNORMAL HIGH (ref 65–99)
Glucose-Capillary: 185 mg/dL — ABNORMAL HIGH (ref 65–99)

## 2017-04-22 LAB — CBC
HEMATOCRIT: 27.3 % — AB (ref 35.0–47.0)
Hemoglobin: 8.9 g/dL — ABNORMAL LOW (ref 12.0–16.0)
MCH: 31.5 pg (ref 26.0–34.0)
MCHC: 32.7 g/dL (ref 32.0–36.0)
MCV: 96.2 fL (ref 80.0–100.0)
Platelets: 143 10*3/uL — ABNORMAL LOW (ref 150–440)
RBC: 2.84 MIL/uL — ABNORMAL LOW (ref 3.80–5.20)
RDW: 15.2 % — AB (ref 11.5–14.5)
WBC: 11.5 10*3/uL — AB (ref 3.6–11.0)

## 2017-04-22 LAB — BASIC METABOLIC PANEL
ANION GAP: 9 (ref 5–15)
BUN: 38 mg/dL — AB (ref 6–20)
CALCIUM: 7.6 mg/dL — AB (ref 8.9–10.3)
CO2: 26 mmol/L (ref 22–32)
Chloride: 96 mmol/L — ABNORMAL LOW (ref 101–111)
Creatinine, Ser: 1.41 mg/dL — ABNORMAL HIGH (ref 0.44–1.00)
GFR calc Af Amer: 45 mL/min — ABNORMAL LOW (ref >60)
GFR calc non Af Amer: 39 mL/min — ABNORMAL LOW (ref >60)
GLUCOSE: 187 mg/dL — AB (ref 65–99)
Potassium: 4.2 mmol/L (ref 3.5–5.1)
Sodium: 131 mmol/L — ABNORMAL LOW (ref 135–145)

## 2017-04-22 LAB — HIV ANTIBODY (ROUTINE TESTING W REFLEX): HIV Screen 4th Generation wRfx: NONREACTIVE

## 2017-04-22 MED ORDER — INSULIN ASPART 100 UNIT/ML ~~LOC~~ SOLN: 0.0000 [IU] | Freq: Every day | SUBCUTANEOUS | Status: DC

## 2017-04-22 MED ORDER — INSULIN ASPART 100 UNIT/ML ~~LOC~~ SOLN
0.0000 [IU] | Freq: Three times a day (TID) | SUBCUTANEOUS | Status: DC
  Administered 2017-04-22: 2 [IU] via SUBCUTANEOUS
  Administered 2017-04-23 – 2017-04-24 (×4): 1 [IU] via SUBCUTANEOUS
  Administered 2017-04-24 – 2017-04-25 (×4): 2 [IU] via SUBCUTANEOUS
  Administered 2017-04-26: 3 [IU] via SUBCUTANEOUS
  Filled 2017-04-22 (×10): qty 1

## 2017-04-22 NOTE — Progress Notes (Signed)
Subjective:  POD #1 s/p left hip hemiarthroplasty.  Patient reports left hip pain as moderate.  Patient is up out of bed to a chair. Her family is at the bedside.    Objective:   VITALS:   Vitals:   04/22/17 0357 04/22/17 0503 04/22/17 0815 04/22/17 1026  BP: 129/66  (!) 124/56 95/75  Pulse: (!) 102  (!) 104 98  Resp: 18  16   Temp: (!) 100.6 F (38.1 C) 99.2 F (37.3 C) 99.7 F (37.6 C)   TempSrc: Oral  Oral   SpO2: 94%  94%   Weight:  72.6 kg (160 lb) 72.1 kg (159 lb)   Height:        PHYSICAL EXAM: Left lower extremity: Neurovascular intact Sensation intact distally Intact pulses distally Dorsiflexion/Plantar flexion intact Incision: scant drainage No cellulitis present Compartment soft  LABS  Results for orders placed or performed during the hospital encounter of 04/01/2017 (from the past 24 hour(s))  Glucose, capillary     Status: Abnormal   Collection Time: 04/18/2017  1:20 PM  Result Value Ref Range   Glucose-Capillary 110 (H) 65 - 99 mg/dL  Glucose, capillary     Status: None   Collection Time: 04/12/2017  5:09 PM  Result Value Ref Range   Glucose-Capillary 84 65 - 99 mg/dL  Glucose, capillary     Status: Abnormal   Collection Time: 04/06/2017  9:41 PM  Result Value Ref Range   Glucose-Capillary 38 (LL) 65 - 99 mg/dL   Comment 1 Notify RN    Comment 2 Document in Chart   Glucose, capillary     Status: Abnormal   Collection Time: 04/11/2017 10:04 PM  Result Value Ref Range   Glucose-Capillary 127 (H) 65 - 99 mg/dL  CBC     Status: Abnormal   Collection Time: 04/22/17  3:40 AM  Result Value Ref Range   WBC 11.5 (H) 3.6 - 11.0 K/uL   RBC 2.84 (L) 3.80 - 5.20 MIL/uL   Hemoglobin 8.9 (L) 12.0 - 16.0 g/dL   HCT 27.3 (L) 35.0 - 47.0 %   MCV 96.2 80.0 - 100.0 fL   MCH 31.5 26.0 - 34.0 pg   MCHC 32.7 32.0 - 36.0 g/dL   RDW 15.2 (H) 11.5 - 14.5 %   Platelets 143 (L) 150 - 440 K/uL  Basic metabolic panel     Status: Abnormal   Collection Time: 04/22/17  3:40 AM   Result Value Ref Range   Sodium 131 (L) 135 - 145 mmol/L   Potassium 4.2 3.5 - 5.1 mmol/L   Chloride 96 (L) 101 - 111 mmol/L   CO2 26 22 - 32 mmol/L   Glucose, Bld 187 (H) 65 - 99 mg/dL   BUN 38 (H) 6 - 20 mg/dL   Creatinine, Ser 1.41 (H) 0.44 - 1.00 mg/dL   Calcium 7.6 (L) 8.9 - 10.3 mg/dL   GFR calc non Af Amer 39 (L) >60 mL/min   GFR calc Af Amer 45 (L) >60 mL/min   Anion gap 9 5 - 15  Glucose, capillary     Status: Abnormal   Collection Time: 04/22/17  8:20 AM  Result Value Ref Range   Glucose-Capillary 172 (H) 65 - 99 mg/dL   Comment 1 Notify RN   Glucose, capillary     Status: Abnormal   Collection Time: 04/22/17 12:07 PM  Result Value Ref Range   Glucose-Capillary 127 (H) 65 - 99 mg/dL   Comment 1 Notify  RN     Dg Chest 1 View  Result Date: 04/19/2017 CLINICAL DATA:  62 year old female with fall and left hip pain. Preop evaluation radiograph. EXAM: CHEST 1 VIEW COMPARISON:  Chest radiograph dated 12/27/2015 FINDINGS: The lungs are clear. There is no pleural effusion or pneumothorax. The cardiac silhouette is within normal limits. Partially visualized lower cervical fusion hardware. No acute osseous pathology. IMPRESSION: No active disease. Electronically Signed   By: Anner Crete M.D.   On: 04/14/2017 21:14   Ct Head Wo Contrast  Result Date: 04/17/2017 CLINICAL DATA:  Fall. EXAM: CT HEAD WITHOUT CONTRAST CT CERVICAL SPINE WITHOUT CONTRAST TECHNIQUE: Multidetector CT imaging of the head and cervical spine was performed following the standard protocol without intravenous contrast. Multiplanar CT image reconstructions of the cervical spine were also generated. COMPARISON:  10/31/2015 FINDINGS: CT HEAD FINDINGS Brain: There is atrophy and chronic small vessel disease changes. No acute intracranial abnormality. Specifically, no hemorrhage, hydrocephalus, mass lesion, acute infarction, or significant intracranial injury. Vascular: No hyperdense vessel or unexpected  calcification. Skull: No acute calvarial abnormality. Sinuses/Orbits: Visualized paranasal sinuses and mastoids clear. Orbital soft tissues unremarkable. Other: None CT CERVICAL SPINE FINDINGS Alignment: Normal Skull base and vertebrae: No fracture. Prior posterior fusion from C3-C6. 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 Compression fracture involving the C7 vertebral body is again noted as seen on prior study from 2017, slightly progressed in the degree of collapse. No acute fracture. Soft tissues and spinal canal: Prevertebral soft tissues are normal. No epidural or paraspinal hematoma. Disc levels: Congenital fusion across C5-6. Remainder the disc spaces are maintained. Upper chest: Emphysema and biapical scarring. Other: Carotid artery calcifications.  No acute findings. IMPRESSION: No acute intracranial abnormality. Atrophy, chronic microvascular disease. 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 Posterior fusion changes from C3-C6. No acute bony abnormality. Old C7 compression fracture, progressed since 2017. Electronically Signed   By: Rolm Baptise M.D.   On: 04/01/2017 21:15   Ct Cervical Spine Wo Contrast  Result Date: 03/26/2017 CLINICAL DATA:  Fall. EXAM: CT HEAD WITHOUT CONTRAST CT CERVICAL SPINE WITHOUT CONTRAST TECHNIQUE: Multidetector CT imaging of the head and cervical spine was performed following the standard protocol without intravenous contrast. Multiplanar CT image reconstructions of the cervical spine were also generated. COMPARISON:  10/31/2015 FINDINGS: CT HEAD FINDINGS Brain: There is atrophy and chronic small vessel disease changes. No acute intracranial abnormality. Specifically, no hemorrhage, hydrocephalus, mass lesion, acute infarction, or significant intracranial injury. Vascular: No hyperdense vessel or unexpected calcification. Skull: No acute calvarial abnormality. Sinuses/Orbits: Visualized paranasal sinuses and mastoids clear. Orbital soft tissues unremarkable. Other: None CT CERVICAL SPINE FINDINGS  Alignment: Normal Skull base and vertebrae: No fracture. Prior posterior fusion from C3-C6. 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 Compression fracture involving the C7 vertebral body is again noted as seen on prior study from 2017, slightly progressed in the degree of collapse. No acute fracture. Soft tissues and spinal canal: Prevertebral soft tissues are normal. No epidural or paraspinal hematoma. Disc levels: Congenital fusion across C5-6. Remainder the disc spaces are maintained. Upper chest: Emphysema and biapical scarring. Other: Carotid artery calcifications.  No acute findings. IMPRESSION: No acute intracranial abnormality. Atrophy, chronic microvascular disease. 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 Posterior fusion changes from C3-C6. No acute bony abnormality. Old C7 compression fracture, progressed since 2017. Electronically Signed   By: Rolm Baptise M.D.   On: 04/07/2017 21:15   Dg Hip Port Unilat With Pelvis 1v Left  Result Date: 04/08/2017 CLINICAL DATA:  Status post total hip replacement EXAM: DG HIP (  WITH OR WITHOUT PELVIS) 2V PORT LEFT COMPARISON:  April 20, 2017 FINDINGS: Frontal pelvis as well as lateral left hip images were obtained. There is a total hip replacement on the left with prosthetic components well-seated. No acute fracture or dislocation currently. There is moderate narrowing of the right hip joint. Bones are osteoporotic. There is aortoiliac atherosclerosis. There is common and superficial femoral artery calcification bilaterally. There is postoperative change on the left. IMPRESSION: Total hip replacement on the left with prosthetic component well-seated. No acute fracture or dislocation. Moderate narrowing right hip joint. Bones osteoporotic.  Aortoiliac and femoral artery atherosclerosis. Aortic Atherosclerosis (ICD10-I70.0). Electronically Signed   By: Lowella Grip III M.D.   On: 04/08/2017 18:14   Dg Hip Unilat W Or Wo Pelvis 2-3 Views Left  Result Date: 03/31/2017 CLINICAL DATA:   The patient suffered a fall today. Left hip pain. Initial encounter. EXAM: DG HIP (WITH OR WITHOUT PELVIS) 2-3V LEFT COMPARISON:  None. FINDINGS: The patient has an acute subcapital fracture of the left hip. No other acute abnormality is identified. IMPRESSION: Acute subcapital fracture left hip. Electronically Signed   By: Inge Rise M.D.   On: 04/04/2017 21:11    Assessment/Plan: 1 Day Post-Op   Active Problems:   Hip fracture [facility])  Patient is doing well from orthopedic standpoint. She has up out of bed to a chair. She has moderate pain in the left hip. Continue current pain management. Patient will begin Lovenox for DVT prophylaxis today. She has completed 24 hours postop antibiotics.  I reviewed the postoperative x-rays. The hip prosthesis is well positioned. There is no evidence of fracture or dislocation.    Thornton Park , MD 04/22/2017, 12:18 PM

## 2017-04-22 NOTE — Progress Notes (Signed)
[facility] Cardiology [facility] Encounter Note  Patient: Samantha Mcmillan / Admit Date: 04/11/2017 / Date of Encounter: 04/22/2017, 8:16 AM   Subjective: Patient doing well this morning with no evidence of congestive heart failure or concerns of chest discomfort.  No evidence of diastolic dysfunction heart failure status post hip surgery.  Patient tolerated surgery well without complication  Review of Systems: Positive for: Hip pain weakness Negative for: Vision change, hearing change, syncope, dizziness, nausea, vomiting,diarrhea, bloody stool, stomach pain, cough, congestion, diaphoresis, urinary frequency, urinary pain,skin lesions, skin rashes Others previously listed  Objective: Telemetry: Normal sinus rhythm Physical Exam: Blood pressure (!) 124/56, pulse (!) 104, temperature 99.7 F (37.6 C), temperature source Oral, resp. rate 16, height 5\' 4"  (1.626 m), weight 160 lb (72.6 kg), SpO2 94 %. Body mass index is 27.46 kg/m. General: Well developed, well nourished, in no acute distress. Head: Normocephalic, atraumatic, sclera non-icteric, no xanthomas, nares are without discharge. Neck: No apparent masses Lungs: Normal respirations with few wheezes, no rhonchi, no rales , no crackles   Heart: Regular rate and rhythm, normal S1 S2, no murmur, no rub, no gallop, PMI is normal size and placement, carotid upstroke normal without bruit, jugular venous pressure normal Abdomen: Soft, non-tender, non-distended with normoactive bowel sounds. No hepatosplenomegaly. Abdominal aorta is normal size without bruit Extremities: No edema, no clubbing, no cyanosis, no ulcers,  Peripheral: 2+ radial, 2+ femoral, 2+ dorsal pedal pulses Neuro: Alert and oriented. Moves all extremities spontaneously. Psych:  Responds to questions appropriately with a normal affect.   Intake/Output Summary (Last 24 hours) at 04/22/2017 0816 Last data filed at 04/22/2017 0333 Gross per 24 hour  Intake 2272.5 ml  Output  2500 ml  Net -227.5 ml    Inpatient Medications:  . allopurinol  100 mg Oral Daily  . ARIPiprazole  5 mg Oral QHS  . atorvastatin  20 mg Oral Daily  . calcium-vitamin D   Oral Daily  . dextrose      . docusate sodium  100 mg Oral BID  . enoxaparin (LOVENOX) injection  40 mg Subcutaneous Q24H  . Ferrous Fumarate  1 tablet Oral BID PC   And  . iron polysaccharides  150 mg Oral BID PC  . glucose      . Influenza vac split quadrivalent PF  0.5 mL Intramuscular Tomorrow-1000  . insulin aspart  0-15 Units Subcutaneous TID WC  . lamoTRIgine  150 mg Oral QHS  . levothyroxine  88 mcg Oral QAC breakfast  . magnesium oxide  400 mg Oral BID  . metoprolol tartrate  50 mg Oral BID  . mometasone-formoterol  2 puff Inhalation BID  . multivitamin with minerals  1 tablet Oral Daily  . pantoprazole  40 mg Oral Daily  . sertraline  200 mg Oral Daily  . spironolactone  25 mg Oral Daily   Infusions:  . sodium chloride 75 mL/hr at 04/22/17 0346  . methocarbamol (ROBAXIN)  IV      Labs: Recent Labs    04/09/2017 0421 04/22/17 0340  NA 130* 131*  K 3.9 4.2  CL 93* 96*  CO2 29 26  GLUCOSE 135* 187*  BUN 47* 38*  CREATININE 1.82* 1.41*  CALCIUM 7.9* 7.6*   Recent Labs    04/06/2017 2330  AST 79*  ALT 35  ALKPHOS 252*  BILITOT 0.7  PROT 6.2*  ALBUMIN 2.5*   Recent Labs    04/03/2017 0421 04/22/17 0340  WBC 5.0 11.5*  HGB 10.1* 8.9*  HCT 30.1* 27.3*  MCV 94.6 96.2  PLT 126* 143*   Recent Labs    04/13/2017 2256 04/08/2017 0421 03/31/2017 1018  TROPONINI <0.03 <0.03 <0.03   Invalid input(s): POCBNP No results for input(s): HGBA1C in the last 72 hours.   Weights: Filed Weights   04/17/2017 0008 04/16/2017 1309 04/22/17 0503  Weight: 156 lb 14.4 oz (71.2 kg) 156 lb (70.8 kg) 160 lb (72.6 kg)     Radiology/Studies:  Dg Chest 1 View  Result Date: 04/08/2017 CLINICAL DATA:  62 year old female with fall and left hip pain. Preop evaluation radiograph. EXAM: CHEST 1 VIEW COMPARISON:   Chest radiograph dated 12/27/2015 FINDINGS: The lungs are clear. There is no pleural effusion or pneumothorax. The cardiac silhouette is within normal limits. Partially visualized lower cervical fusion hardware. No acute osseous pathology. IMPRESSION: No active disease. Electronically Signed   By: Anner Crete M.D.   On: 04/22/2017 21:14   Ct Head Wo Contrast  Result Date: 04/18/2017 CLINICAL DATA:  Fall. EXAM: CT HEAD WITHOUT CONTRAST CT CERVICAL SPINE WITHOUT CONTRAST TECHNIQUE: Multidetector CT imaging of the head and cervical spine was performed following the standard protocol without intravenous contrast. Multiplanar CT image reconstructions of the cervical spine were also generated. COMPARISON:  10/31/2015 FINDINGS: CT HEAD FINDINGS Brain: There is atrophy and chronic small vessel disease changes. No acute intracranial abnormality. Specifically, no hemorrhage, hydrocephalus, mass lesion, acute infarction, or significant intracranial injury. Vascular: No hyperdense vessel or unexpected calcification. Skull: No acute calvarial abnormality. Sinuses/Orbits: Visualized paranasal sinuses and mastoids clear. Orbital soft tissues unremarkable. Other: None CT CERVICAL SPINE FINDINGS Alignment: Normal Skull base and vertebrae: No fracture. Prior posterior fusion from C3-C6. 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 Compression fracture involving the C7 vertebral body is again noted as seen on prior study from 2017, slightly progressed in the degree of collapse. No acute fracture. Soft tissues and spinal canal: Prevertebral soft tissues are normal. No epidural or paraspinal hematoma. Disc levels: Congenital fusion across C5-6. Remainder the disc spaces are maintained. Upper chest: Emphysema and biapical scarring. Other: Carotid artery calcifications.  No acute findings. IMPRESSION: No acute intracranial abnormality. Atrophy, chronic microvascular disease. 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 Posterior fusion changes from C3-C6. No acute  bony abnormality. Old C7 compression fracture, progressed since 2017. Electronically Signed   By: Rolm Baptise M.D.   On: 04/05/2017 21:15   Ct Cervical Spine Wo Contrast  Result Date: 04/03/2017 CLINICAL DATA:  Fall. EXAM: CT HEAD WITHOUT CONTRAST CT CERVICAL SPINE WITHOUT CONTRAST TECHNIQUE: Multidetector CT imaging of the head and cervical spine was performed following the standard protocol without intravenous contrast. Multiplanar CT image reconstructions of the cervical spine were also generated. COMPARISON:  10/31/2015 FINDINGS: CT HEAD FINDINGS Brain: There is atrophy and chronic small vessel disease changes. No acute intracranial abnormality. Specifically, no hemorrhage, hydrocephalus, mass lesion, acute infarction, or significant intracranial injury. Vascular: No hyperdense vessel or unexpected calcification. Skull: No acute calvarial abnormality. Sinuses/Orbits: Visualized paranasal sinuses and mastoids clear. Orbital soft tissues unremarkable. Other: None CT CERVICAL SPINE FINDINGS Alignment: Normal Skull base and vertebrae: No fracture. Prior posterior fusion from C3-C6. 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 Compression fracture involving the C7 vertebral body is again noted as seen on prior study from 2017, slightly progressed in the degree of collapse. No acute fracture. Soft tissues and spinal canal: Prevertebral soft tissues are normal. No epidural or paraspinal hematoma. Disc levels: Congenital fusion across C5-6. Remainder the disc spaces are maintained. Upper chest: Emphysema and biapical scarring. Other: Carotid  artery calcifications.  No acute findings. IMPRESSION: No acute intracranial abnormality. Atrophy, chronic microvascular disease. 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 Posterior fusion changes from C3-C6. No acute bony abnormality. Old C7 compression fracture, progressed since 2017. Electronically Signed   By: Rolm Baptise M.D.   On: 04/18/2017 21:15   Dg Hip Port Unilat With Pelvis 1v Left  Result  Date: 04/12/2017 CLINICAL DATA:  Status post total hip replacement EXAM: DG HIP (WITH OR WITHOUT PELVIS) 2V PORT LEFT COMPARISON:  April 20, 2017 FINDINGS: Frontal pelvis as well as lateral left hip images were obtained. There is a total hip replacement on the left with prosthetic components well-seated. No acute fracture or dislocation currently. There is moderate narrowing of the right hip joint. Bones are osteoporotic. There is aortoiliac atherosclerosis. There is common and superficial femoral artery calcification bilaterally. There is postoperative change on the left. IMPRESSION: Total hip replacement on the left with prosthetic component well-seated. No acute fracture or dislocation. Moderate narrowing right hip joint. Bones osteoporotic.  Aortoiliac and femoral artery atherosclerosis. Aortic Atherosclerosis (ICD10-I70.0). Electronically Signed   By: Lowella Grip III M.D.   On: 04/21/2017 18:14   Dg Hip Unilat W Or Wo Pelvis 2-3 Views Left  Result Date: 04/16/2017 CLINICAL DATA:  The patient suffered a fall today. Left hip pain. Initial encounter. EXAM: DG HIP (WITH OR WITHOUT PELVIS) 2-3V LEFT COMPARISON:  None. FINDINGS: The patient has an acute subcapital fracture of the left hip. No other acute abnormality is identified. IMPRESSION: Acute subcapital fracture left hip. Electronically Signed   By: Inge Rise M.D.   On: 04/11/2017 21:11     Assessment and Recommendation  62 y.o. 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  Continue rehabilitation without restriction 2.  Continue metoprolol for heart rate control and type hypertension control with reduction in risk for chronic diastolic dysfunction heart failure 3.  Continue oral diuresis with spironolactone and Lasix 4.  No further cardiac  diagnostics necessary at this time 5.  Call if further questions Signed, Serafina Royals M.D. FACC

## 2017-04-22 NOTE — Anesthesia Postprocedure Evaluation (Signed)
Anesthesia Post Note  Patient: Samantha Mcmillan  Procedure(s) Performed: ARTHROPLASTY BIPOLAR HIP (HEMIARTHROPLASTY) (Left Hip)  Patient location during evaluation: PACU Anesthesia Type: Spinal Level of consciousness: oriented and awake and alert Pain management: pain level controlled Vital Signs Assessment: post-procedure vital signs reviewed and stable Respiratory status: spontaneous breathing and respiratory function stable Cardiovascular status: blood pressure returned to baseline and stable Postop Assessment: no headache, no backache and no apparent nausea or vomiting Anesthetic complications: no     Last Vitals:  Vitals:   04/22/17 0357 04/22/17 0503  BP: 129/66   Pulse: (!) 102   Resp: 18   Temp: (!) 38.1 C 37.3 C  SpO2: 94%     Last Pain:  Vitals:   04/22/17 0446  TempSrc:   PainSc: Asleep                 Brantley Fling

## 2017-04-22 NOTE — Progress Notes (Signed)
Pt still unable to void. Bladder scan showed 430 mL. In and out cath performed. 350 mL out. Pt states she feels relief on bladder. Will continue to monitor.

## 2017-04-22 NOTE — Progress Notes (Signed)
[name], admission coordinator at [facility], received [facility] authorization. Patient is aware of above. Social work Theatre manager contacted patient's caregiver [name] and made him aware of above.  [name], Social Work Intern  [phone]

## 2017-04-22 NOTE — Progress Notes (Addendum)
Social Work Theatre manager presented bed offers. Patient chose [facility]. [name], admission coordinator at [facility], is aware of accepted bed offer and will start [facility] authorization. Social work Theatre manager attempted to contact patient's caregiver [name] to make him aware of above and voicemail's could not be left.  [name], Social Work Intern [phone]

## 2017-04-22 NOTE — Progress Notes (Signed)
[facility] NAME: Samantha Mcmillan    MR#:  [id number]  DATE OF BIRTH:  1955-03-04  SUBJECTIVE:   Patient here after a fall and noted to have a left hip fracture. S/p Left Hip Hemiarthroplasty POD # 1.  A bit lethargic this a.m. Awaiting PT eval.  BS a bit on the low side today.   REVIEW OF SYSTEMS:    Review of Systems  Constitutional: Negative for chills and fever.  HENT: Negative for congestion and tinnitus.   Eyes: Negative for blurred vision and double vision.  Respiratory: Negative for cough, shortness of breath and wheezing.   Cardiovascular: Negative for chest pain, orthopnea and PND.  Gastrointestinal: Negative for abdominal pain, diarrhea, nausea and vomiting.  Genitourinary: Negative for dysuria and hematuria.  Musculoskeletal: Positive for falls and joint pain (Left Hip).  Neurological: Negative for dizziness, sensory change and focal weakness.  All other systems reviewed and are negative.   Nutrition: Heart healthy/Carb control Tolerating Diet: Yes Tolerating PT: Eval noted.   DRUG ALLERGIES:   Allergies  Allergen Reactions  . Iodine Shortness Of Breath  . Latex Anaphylaxis  . Other Rash, Other (See Comments), Shortness Of Breath, Anaphylaxis and Swelling    Pt states that she is allergic to Darvocet.   . Oxycodone Shortness Of Breath, Rash and Swelling    Other reaction(s): Other (See Comments) wheezing  . Oxycodone-Acetaminophen Other (See Comments), Rash, Swelling and Shortness Of Breath    wheezing wheezing  . Shellfish-Derived Products Anaphylaxis, Rash, Shortness Of Breath and Swelling    Throat closes.  . Amitriptyline Other (See Comments)    Other reaction(s): Other (See Comments) Mental changes Other reaction(s): Other (See Comments) Altered mental status, agitation Reaction:  Mental changes   . Fish-Derived Products Other (See Comments) and Swelling    Other reaction(s): Other (See  Comments) "respiratory distress and wheezing" "respiratory distress and wheezing"  . Tape Other (See Comments)    Other reaction(s): Other (See Comments) Pulls skin off Pt states that it pulls her skin off.  Pt states that it pulls her skin off.   . Wellbutrin [Bupropion] Other (See Comments)    Other reaction(s): Other (See Comments) Mental changes Reaction:  Mental changes   . Augmentin [Amoxicillin-Pot Clavulanate] Rash and Other (See Comments)    Has patient had a PCN reaction causing immediate rash, facial/tongue/throat swelling, SOB or lightheadedness with hypotension: No Has patient had a PCN reaction causing severe rash involving mucus membranes or skin necrosis: No Has patient had a PCN reaction that required hospitalization No Has patient had a PCN reaction occurring within the last 10 years: No If all of the above answers are "NO", then may proceed with Cephalosporin use.  . Codeine Rash  . Cortisone Rash  . Diphenhydramine Rash  . Hydrocodone-Acetaminophen Rash    dizzy  . Vicodin [Hydrocodone-Acetaminophen] Rash and Other (See Comments)    Reaction:  Dizziness      VITALS:  Blood pressure (!) 108/49, pulse 88, temperature 97.9 F (36.6 C), temperature source Oral, resp. rate 16, height 5\' 4"  (1.626 m), weight 72.1 kg (159 lb), SpO2 98 %.  PHYSICAL EXAMINATION:   Physical Exam  GENERAL:  62 y.o.-year-old patient lying in bed in pain and some mild distress.   EYES: Pupils equal, round, reactive to light and accommodation. No scleral icterus. Extraocular muscles intact.  HEENT: Head atraumatic, normocephalic. Oropharynx and nasopharynx clear.  NECK:  Supple, no jugular venous  distention. No thyroid enlargement, no tenderness.  LUNGS: Normal breath sounds bilaterally, minimal wheezing b/l, rales, rhonchi. No use of accessory muscles of respiration.  CARDIOVASCULAR: S1, S2 normal. No murmurs, rubs, or gallops.  ABDOMEN: Soft, nontender, nondistended. Bowel sounds  present. No organomegaly or mass.  EXTREMITIES: No cyanosis, clubbing or edema b/l.  left hip dressing in place from recent surgery. No acute bleeding noted.  NEUROLOGIC: Cranial nerves II through XII are intact. No focal Motor or sensory deficits b/l.   PSYCHIATRIC: The patient is alert and oriented x 3.  SKIN: No obvious rash, lesion, or ulcer.    LABORATORY PANEL:   CBC Recent Labs  Lab 04/22/17 0340  WBC 11.5*  HGB 8.9*  HCT 27.3*  PLT 143*   ------------------------------------------------------------------------------------------------------------------  Chemistries  Recent Labs  Lab 04/02/2017 2330  04/22/17 0340  NA 131*   < > 131*  K 3.9   < > 4.2  CL 91*   < > 96*  CO2 29   < > 26  GLUCOSE 115*   < > 187*  BUN 52*   < > 38*  CREATININE 2.03*   < > 1.41*  CALCIUM 8.1*   < > 7.6*  AST 79*  --   --   ALT 35  --   --   ALKPHOS 252*  --   --   BILITOT 0.7  --   --    < > = values in this interval not displayed.   ------------------------------------------------------------------------------------------------------------------  Cardiac Enzymes Recent Labs  Lab 04/19/2017 1018  TROPONINI <0.03   ------------------------------------------------------------------------------------------------------------------  RADIOLOGY:  Dg Chest 1 View  Result Date: 04/14/2017 CLINICAL DATA:  62 year old female with fall and left hip pain. Preop evaluation radiograph. EXAM: CHEST 1 VIEW COMPARISON:  Chest radiograph dated 12/27/2015 FINDINGS: The lungs are clear. There is no pleural effusion or pneumothorax. The cardiac silhouette is within normal limits. Partially visualized lower cervical fusion hardware. No acute osseous pathology. IMPRESSION: No active disease. Electronically Signed   By: Anner Crete M.D.   On: 04/11/2017 21:14   Ct Head Wo Contrast  Result Date: 03/31/2017 CLINICAL DATA:  Fall. EXAM: CT HEAD WITHOUT CONTRAST CT CERVICAL SPINE WITHOUT CONTRAST  TECHNIQUE: Multidetector CT imaging of the head and cervical spine was performed following the standard protocol without intravenous contrast. Multiplanar CT image reconstructions of the cervical spine were also generated. COMPARISON:  10/31/2015 FINDINGS: CT HEAD FINDINGS Brain: There is atrophy and chronic small vessel disease changes. No acute intracranial abnormality. Specifically, no hemorrhage, hydrocephalus, mass lesion, acute infarction, or significant intracranial injury. Vascular: No hyperdense vessel or unexpected calcification. Skull: No acute calvarial abnormality. Sinuses/Orbits: Visualized paranasal sinuses and mastoids clear. Orbital soft tissues unremarkable. Other: None CT CERVICAL SPINE FINDINGS Alignment: Normal Skull base and vertebrae: No fracture. Prior posterior fusion from C3-C6. 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 Compression fracture involving the C7 vertebral body is again noted as seen on prior study from 2017, slightly progressed in the degree of collapse. No acute fracture. Soft tissues and spinal canal: Prevertebral soft tissues are normal. No epidural or paraspinal hematoma. Disc levels: Congenital fusion across C5-6. Remainder the disc spaces are maintained. Upper chest: Emphysema and biapical scarring. Other: Carotid artery calcifications.  No acute findings. IMPRESSION: No acute intracranial abnormality. Atrophy, chronic microvascular disease. 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 Posterior fusion changes from C3-C6. No acute bony abnormality. Old C7 compression fracture, progressed since 2017. Electronically Signed   By: Rolm Baptise M.D.   On: 04/22/2017 21:15  Ct Cervical Spine Wo Contrast  Result Date: 03/29/2017 CLINICAL DATA:  Fall. EXAM: CT HEAD WITHOUT CONTRAST CT CERVICAL SPINE WITHOUT CONTRAST TECHNIQUE: Multidetector CT imaging of the head and cervical spine was performed following the standard protocol without intravenous contrast. Multiplanar CT image reconstructions of the cervical  spine were also generated. COMPARISON:  10/31/2015 FINDINGS: CT HEAD FINDINGS Brain: There is atrophy and chronic small vessel disease changes. No acute intracranial abnormality. Specifically, no hemorrhage, hydrocephalus, mass lesion, acute infarction, or significant intracranial injury. Vascular: No hyperdense vessel or unexpected calcification. Skull: No acute calvarial abnormality. Sinuses/Orbits: Visualized paranasal sinuses and mastoids clear. Orbital soft tissues unremarkable. Other: None CT CERVICAL SPINE FINDINGS Alignment: Normal Skull base and vertebrae: No fracture. Prior posterior fusion from C3-C6. 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 Compression fracture involving the C7 vertebral body is again noted as seen on prior study from 2017, slightly progressed in the degree of collapse. No acute fracture. Soft tissues and spinal canal: Prevertebral soft tissues are normal. No epidural or paraspinal hematoma. Disc levels: Congenital fusion across C5-6. Remainder the disc spaces are maintained. Upper chest: Emphysema and biapical scarring. Other: Carotid artery calcifications.  No acute findings. IMPRESSION: No acute intracranial abnormality. Atrophy, chronic microvascular disease. 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 Posterior fusion changes from C3-C6. No acute bony abnormality. Old C7 compression fracture, progressed since 2017. Electronically Signed   By: Rolm Baptise M.D.   On: 04/22/2017 21:15   Dg Hip Port Unilat With Pelvis 1v Left  Result Date: 03/28/2017 CLINICAL DATA:  Status post total hip replacement EXAM: DG HIP (WITH OR WITHOUT PELVIS) 2V PORT LEFT COMPARISON:  April 20, 2017 FINDINGS: Frontal pelvis as well as lateral left hip images were obtained. There is a total hip replacement on the left with prosthetic components well-seated. No acute fracture or dislocation currently. There is moderate narrowing of the right hip joint. Bones are osteoporotic. There is aortoiliac atherosclerosis. There is common and  superficial femoral artery calcification bilaterally. There is postoperative change on the left. IMPRESSION: Total hip replacement on the left with prosthetic component well-seated. No acute fracture or dislocation. Moderate narrowing right hip joint. Bones osteoporotic.  Aortoiliac and femoral artery atherosclerosis. Aortic Atherosclerosis (ICD10-I70.0). Electronically Signed   By: Lowella Grip III M.D.   On: 04/20/2017 18:14   Dg Hip Unilat W Or Wo Pelvis 2-3 Views Left  Result Date: 04/13/2017 CLINICAL DATA:  The patient suffered a fall today. Left hip pain. Initial encounter. EXAM: DG HIP (WITH OR WITHOUT PELVIS) 2-3V LEFT COMPARISON:  None. FINDINGS: The patient has an acute subcapital fracture of the left hip. No other acute abnormality is identified. IMPRESSION: Acute subcapital fracture left hip. Electronically Signed   By: Inge Rise M.D.   On: 04/21/2017 21:11     ASSESSMENT AND PLAN:   62 yo female w/ hx of DM, HTN, Hyperlipidemia, hx of Cirrhosis, Chronic Bronchitis, Anxiety, Anemia, GERD, Depression, PTSD, Chronic Diastolic CHF, hx of Panic Attack who presented to the hospital due to a fall and noted to have a Left Hip Fracture.   1. S/p Fall and Left Hip Fracture - status post left hip hemiarthroplasty. Postoperative day #1 today. -Continue physical therapy as tolerated, continue pain control with PRN Tramadol.    2. History of chronic diastolic CHF-patient clinically is not in congestive heart failure. -Continue Aldactone, metoprolol  3. History of gout-continue allopurinol  4. Hyperlipidemia-continue atorvastatin.  5. Diabetes type 2 without complication-wishes blood sugar were obtained on the low side this morning,  reduce sliding scale to the sensitive scale, follow blood sugars. -Hypoglycemia improved with some dextrose this morning.  6. Hypothyroidism-continue Synthroid.  7. History of PTSD-continue Lamictal.  8. Essential hypertension-continue  metoprolol.  9. GERD-continue Protonix.  10. Depression-continue Zoloft.  All the records are reviewed and case discussed with Care Management/Social Worker. Management plans discussed with the patient, family and they are in agreement.  CODE STATUS: Full code  DVT Prophylaxis: Lovenox   TOTAL TIME TAKING CARE OF THIS PATIENT: 30 minutes.   POSSIBLE D/C IN 2-3 DAYS, DEPENDING ON CLINICAL CONDITION.   Henreitta Leber M.D on 04/22/2017 at 1:23 PM  Between 7am to 6pm - Pager - [phone]  After 6pm go to [email or web address] - Proofreader  [facility]  Office  [phone]  CC: Primary care physician; Casilda Carls, MD

## 2017-04-22 NOTE — Progress Notes (Signed)
Pt unable to void since foley removal. Bladder scan done and showed 220 mL. MD [place] notified. Verbal orders to monitor and in and out cath if bladder scan greater than 400 ml.

## 2017-04-22 NOTE — Progress Notes (Addendum)
Inpatient Diabetes Program Recommendations  AACE/ADA: New Consensus Statement on Inpatient Glycemic Control (2015)  Target Ranges:  Prepandial:   less than 140 mg/dL      Peak postprandial:   less than 180 mg/dL (1-2 hours)      Critically ill patients:  140 - 180 mg/dL   Lab Results  Component Value Date   GLUCAP 172 (H) 04/22/2017   HGBA1C 8.7 (H) 12/28/2015    Review of Glycemic Control Results for Samantha Mcmillan, Samantha Mcmillan (MRN [id number]) as of 04/22/2017 10:32  Ref. Range 04/06/2017 13:20 04/07/2017 17:09 04/09/2017 21:41 03/28/2017 22:04 04/22/2017 08:20  Glucose-Capillary Latest Ref Range: 65 - 99 mg/dL 110 (H) 84 38 (LL) 127 (H) 172 (H)    Diabetes history: Type 2 Outpatient Diabetes medications: Lantus 30 units qam  * confirmed with patient that she takes this every night- no additional diabetes medications.   Current orders for Inpatient glycemic control: Novolog 0-15 units tid  Inpatient Diabetes Program Recommendations: Consider decreasing Novolog to 0-9 units tid- decreased renal function.    Patient had a severe low blood sugar yesterday- likely as a result of Lantus insulin given prior to admission (since we had not given her any insulin while she was her).   Gentry Fitz, RN, BA, MHA, CDE Diabetes Coordinator Inpatient Diabetes Program  [phone] (Team Pager) [phone] ([place]) 04/22/2017 12:30 PM

## 2017-04-22 NOTE — Progress Notes (Signed)
Physical Therapy Treatment Patient Details Name: Samantha Mcmillan MRN: [id number] DOB: 09/26/1955 Today's Date: 04/22/2017    History of Present Illness 61 y/o female s/p fall with L hip fracture and subsequent hemiarthroplasty    PT Comments    Pt much more awake and alert this afternoon and was able to more meaningfully participate, family also present and encouraging.  She was able to take a few more small steps with heavy reliance on the walker but is still very hesitant with WBing though the L and needed a lot of cuing and encouragement.  Pt anxious to sit and actually needed more cuing to get back to sitting than to get to standing.  Pt was able to do more exercises this afternoon and some even against light resistance.  Pt eager to try and do more walking tomorrow, requests a late AM time for the first session as she apparently often sleeps until noon.   Follow Up Recommendations  SNF     Equipment Recommendations       Recommendations for Other Services       Precautions / Restrictions Precautions Precautions: Fall;Posterior Hip Restrictions Weight Bearing Restrictions: Yes LLE Weight Bearing: Weight bearing as tolerated    Mobility  Bed Mobility Overal bed mobility: Needs Assistance Bed Mobility: Sit to Supine     Supine to sit: Mod assist Sit to supine: Mod assist   General bed mobility comments: Pt able to try assisting getting back into bed, but ultimately needed considerable assist  Transfers Overall transfer level: Needs assistance Equipment used: Rolling walker (2 wheeled) Transfers: Sit to/from Stand Sit to Stand: Min assist         General transfer comment: Pt did need assist with set up and sequencing (especially with returning to sitting) and did need direct hands on assist, though much less than this AM  Ambulation/Gait Ambulation/Gait assistance: Mod assist;Min assist Ambulation Distance (Feet): 6 Feet Assistive device: Rolling walker (2  wheeled)       General Gait Details: Pt continues to be very guarded/cautious with WBing on the L but was able to put together a few repeated steps with heavy walker reliance and cuing.  She fatigued quickly with the effort and generally did not tolerate a lot   Stairs            Wheelchair Mobility    Modified Rankin (Stroke Patients Only)       Balance Overall balance assessment: Needs assistance Sitting-balance support: Bilateral upper extremity supported Sitting balance-Leahy Scale: Fair     Standing balance support: Bilateral upper extremity supported Standing balance-Leahy Scale: Fair Standing balance comment: heavily reliant on the walker                            Cognition Arousal/Alertness: Awake/alert Behavior During Therapy: WFL for tasks assessed/performed;Anxious Overall Cognitive Status: Within Functional Limits for tasks assessed                                 General Comments: Pt lethargic, struggled to keep eyes open and ultimately did not answer most questions readily      Exercises Total Joint Exercises Ankle Circles/Pumps: AROM;10 reps Quad Sets: Strengthening;10 reps Gluteal Sets: Strengthening;10 reps Short Arc Quad: AROM;10 reps Heel Slides: AROM;AAROM;10 reps Hip ABduction/ADduction: AROM;10 reps    General Comments        Pertinent Vitals/Pain  Pain Assessment: 0-10 Pain Score: 5     Home Living Family/patient expects to be discharged to:: Skilled nursing facility Living Arrangements: (pt struggles to report, notes indicate ex-husband and g-kids)                  Prior Function Level of Independence: Independent with assistive device(s)      Comments: per pt report, she was lethargic and inconsistent with conversing   PT Goals (current goals can now be found in the care plan section) Acute Rehab PT Goals Patient Stated Goal: pt lethargic, did not state goals PT Goal Formulation: Patient  unable to participate in goal setting Time For Goal Achievement: 05/06/17 Potential to Achieve Goals: Fair Progress towards PT goals: Progressing toward goals    Frequency    BID      PT Plan Current plan remains appropriate    Co-evaluation              AM-PAC PT "6 Clicks" Daily Activity  Outcome Measure  Difficulty turning over in bed (including adjusting bedclothes, sheets and blankets)?: Unable Difficulty moving from lying on back to sitting on the side of the bed? : Unable Difficulty sitting down on and standing up from a chair with arms (e.g., wheelchair, bedside commode, etc,.)?: Unable Help needed moving to and from a bed to chair (including a wheelchair)?: A Lot Help needed walking in hospital room?: A Lot Help needed climbing 3-5 steps with a railing? : Total 6 Click Score: 8    End of Session Equipment Utilized During Treatment: Gait belt Activity Tolerance: Patient limited by fatigue Patient left: with bed alarm set;with call bell/phone within reach;with family/visitor present Nurse Communication: Mobility status PT Visit Diagnosis: Muscle weakness (generalized) (M62.81);Difficulty in walking, not elsewhere classified (R26.2)     Time: 1610-9604 PT Time Calculation (min) (ACUTE ONLY): 42 min  Charges:  $Gait Training: 8-22 mins $Therapeutic Exercise: 8-22 mins $Therapeutic Activity: 8-22 mins                    G Codes:       Kreg Shropshire, DPT 04/22/2017, 3:41 PM

## 2017-04-22 NOTE — Evaluation (Signed)
Physical Therapy Evaluation Patient Details Name: Samantha Mcmillan MRN: [id number] DOB: 13-Jun-1955 Today's Date: 04/22/2017   History of Present Illness  62 y/o female s/p fall with L hip fracture and subsequent hemiarthroplasty  Clinical Impression  Pt very lethargic t/o the session and ultimately needed a lot of redirecting and cuing simply to participate.  She was able to tolerate some light AAROM exercises with less vigor/tolerance with AROM/resisted acts (performed ~12 minutes of supine exercises apart from the exam).  Pt indicated minimal pain at rest but reports 7/10 pain after minimal activity and groaned in pain with most L LE movement.  Pt was able to take a few side shuffling steps but was not confident or steady with the effort.     Follow Up Recommendations SNF    Equipment Recommendations       Recommendations for Other Services       Precautions / Restrictions Precautions Precautions: Fall;Posterior Hip Restrictions Weight Bearing Restrictions: Yes LLE Weight Bearing: Weight bearing as tolerated      Mobility  Bed Mobility Overal bed mobility: Needs Assistance Bed Mobility: Supine to Sit     Supine to sit: Mod assist     General bed mobility comments: Pt showed some effort in getting to sitting, but ultimately needed a lot of assist and did not easily follow instructions  Transfers Overall transfer level: Needs assistance Equipment used: Rolling walker (2 wheeled) Transfers: Sit to/from Stand Sit to Stand: Mod assist;Min assist         General transfer comment: Pt was able to assist with getting to standing, but clearly needed assist and a lot of cuing  Ambulation/Gait Ambulation/Gait assistance: Mod assist;Min assist Ambulation Distance (Feet): 3 Feet Assistive device: Rolling walker (2 wheeled)       General Gait Details: Pt struggled with taking just a few steps to the recliner but was able to maintain balance/upright.  She was hesitant to  put all her weight on the L and needed assist to [place] t/o the effort.  Stairs            Wheelchair Mobility    Modified Rankin (Stroke Patients Only)       Balance Overall balance assessment: Needs assistance Sitting-balance support: Bilateral upper extremity supported Sitting balance-Leahy Scale: Fair     Standing balance support: Bilateral upper extremity supported Standing balance-Leahy Scale: Poor                               Pertinent Vitals/Pain Pain Assessment: 0-10 Pain Score: 7     Home Living Family/patient expects to be discharged to:: Skilled nursing facility Living Arrangements: (pt struggles to report, notes indicate ex-husband and g-kids)                    Prior Function Level of Independence: Independent with assistive device(s)         Comments: per pt report, she was lethargic and inconsistent with conversing     Hand Dominance        Extremity/Trunk Assessment   Upper Extremity Assessment Upper Extremity Assessment: Difficult to assess due to impaired cognition    Lower Extremity Assessment Lower Extremity Assessment: Difficult to assess due to impaired cognition(Pt was able to do some AROM with L LE, not against gravity)       Communication   Communication: (falling asleep t/o session)  Cognition Arousal/Alertness: Lethargic Behavior During Therapy: (minimally interactive)  Overall Cognitive Status: Difficult to assess                                 General Comments: Pt lethargic, struggled to keep eyes open and ultimately did not answer most questions readily      General Comments      Exercises Total Joint Exercises Ankle Circles/Pumps: AROM;10 reps Quad Sets: Strengthening;10 reps Gluteal Sets: Strengthening;10 reps Heel Slides: AAROM;5 reps Hip ABduction/ADduction: AAROM;10 reps   Assessment/Plan    PT Assessment Patient needs continued PT services  PT Problem List  Decreased strength;Decreased activity tolerance;Decreased range of motion;Decreased balance;Decreased mobility;Decreased coordination;Decreased cognition;Decreased knowledge of use of DME;Decreased safety awareness;Decreased knowledge of precautions;Pain       PT Treatment Interventions DME instruction;Gait training;Stair training;Functional mobility training;Therapeutic activities;Therapeutic exercise;Balance training;Cognitive remediation;Neuromuscular re-education;Patient/family education    PT Goals (Current goals can be found in the Care Plan section)  Acute Rehab PT Goals Patient Stated Goal: pt lethargic, did not state goals PT Goal Formulation: Patient unable to participate in goal setting Time For Goal Achievement: 05/06/17 Potential to Achieve Goals: Fair    Frequency BID   Barriers to discharge        Co-evaluation               AM-PAC PT "6 Clicks" Daily Activity  Outcome Measure Difficulty turning over in bed (including adjusting bedclothes, sheets and blankets)?: Unable Difficulty moving from lying on back to sitting on the side of the bed? : Unable Difficulty sitting down on and standing up from a chair with arms (e.g., wheelchair, bedside commode, etc,.)?: Unable Help needed moving to and from a bed to chair (including a wheelchair)?: A Lot Help needed walking in hospital room?: Total Help needed climbing 3-5 steps with a railing? : Total 6 Click Score: 7    End of Session Equipment Utilized During Treatment: Gait belt Activity Tolerance: Patient limited by fatigue;Patient limited by lethargy Patient left: with chair alarm set;with call bell/phone within reach Nurse Communication: Mobility status PT Visit Diagnosis: Muscle weakness (generalized) (M62.81);Difficulty in walking, not elsewhere classified (R26.2)    Time: 8938-1017 PT Time Calculation (min) (ACUTE ONLY): 28 min   Charges:   PT Evaluation $PT Eval Low Complexity: 1 Low PT  Treatments $Therapeutic Exercise: 8-22 mins   PT G Codes:        Kreg Shropshire, DPT 04/22/2017, 1:20 PM

## 2017-04-23 LAB — CBC
HEMATOCRIT: 24.8 % — AB (ref 35.0–47.0)
HEMOGLOBIN: 8.5 g/dL — AB (ref 12.0–16.0)
MCH: 32.7 pg (ref 26.0–34.0)
MCHC: 34.3 g/dL (ref 32.0–36.0)
MCV: 95.1 fL (ref 80.0–100.0)
Platelets: 106 10*3/uL — ABNORMAL LOW (ref 150–440)
RBC: 2.61 MIL/uL — ABNORMAL LOW (ref 3.80–5.20)
RDW: 14.9 % — AB (ref 11.5–14.5)
WBC: 10 10*3/uL (ref 3.6–11.0)

## 2017-04-23 LAB — BASIC METABOLIC PANEL
ANION GAP: 8 (ref 5–15)
BUN: 35 mg/dL — ABNORMAL HIGH (ref 6–20)
CALCIUM: 7.7 mg/dL — AB (ref 8.9–10.3)
CHLORIDE: 97 mmol/L — AB (ref 101–111)
CO2: 23 mmol/L (ref 22–32)
Creatinine, Ser: 1.27 mg/dL — ABNORMAL HIGH (ref 0.44–1.00)
GFR calc Af Amer: 51 mL/min — ABNORMAL LOW (ref >60)
GFR calc non Af Amer: 44 mL/min — ABNORMAL LOW (ref >60)
GLUCOSE: 137 mg/dL — AB (ref 65–99)
Potassium: 3.9 mmol/L (ref 3.5–5.1)
Sodium: 128 mmol/L — ABNORMAL LOW (ref 135–145)

## 2017-04-23 LAB — SURGICAL PATHOLOGY

## 2017-04-23 LAB — GLUCOSE, CAPILLARY
GLUCOSE-CAPILLARY: 156 mg/dL — AB (ref 65–99)
GLUCOSE-CAPILLARY: 160 mg/dL — AB (ref 65–99)
Glucose-Capillary: 133 mg/dL — ABNORMAL HIGH (ref 65–99)
Glucose-Capillary: 160 mg/dL — ABNORMAL HIGH (ref 65–99)

## 2017-04-23 MED ORDER — TAMSULOSIN HCL 0.4 MG PO CAPS
0.4000 mg | ORAL_CAPSULE | Freq: Every day | ORAL | Status: DC
  Administered 2017-04-23: 0.4 mg via ORAL
  Filled 2017-04-23: qty 1

## 2017-04-23 MED ORDER — ENOXAPARIN SODIUM 40 MG/0.4ML ~~LOC~~ SOLN: 40.0000 mg | SUBCUTANEOUS | Status: AC

## 2017-04-23 MED ORDER — TAMSULOSIN HCL 0.4 MG PO CAPS: 0.4000 mg | ORAL_CAPSULE | Freq: Every day | ORAL | Status: AC

## 2017-04-23 MED ORDER — TRAMADOL HCL 50 MG PO TABS: 50.0000 mg | ORAL_TABLET | Freq: Four times a day (QID) | ORAL | Status: AC | PRN

## 2017-04-23 NOTE — Progress Notes (Signed)
Plan is for patient to D/C to [facility] tomorrow to room 412 pending medical clearance. Clinical Education officer, museum (CSW) sent D/C summary to [facility] today via [employer]. [facility] authorization has been received. Tiffany admissions coordinator at [facility] is aware of above.   [employer], LCSW [phone]

## 2017-04-23 NOTE — Progress Notes (Signed)
Patient request to come off venturi mask. Placed on nasal cannula. Tolerated well. o2 saturation 92-94%

## 2017-04-23 NOTE — Progress Notes (Signed)
Physical Therapy Treatment Patient Details Name: Samantha Mcmillan MRN: [id number] DOB: 12-Jan-1956 Today's Date: 04/23/2017    History of Present Illness 62 y/o female s/p fall with L hip fracture and subsequent hemiarthroplasty    PT Comments    Pt still with very poor activity tolerance, low O2 sats with need for constant cues to avoid mouth breathing, etc.  She was able to stay awake minimally better than this AM but is still lethargic, low energy and generally appeared poorly.  Pt was eager to participate and willing when awake, but generally was unable to do much.  Nursing notified of low O2 sats t/o the session (generally in the mid/low 80s) despite limited activity.   Follow Up Recommendations  SNF     Equipment Recommendations       Recommendations for Other Services       Precautions / Restrictions Precautions Precautions: Fall;Posterior Hip Restrictions LLE Weight Bearing: Weight bearing as tolerated    Mobility  Bed Mobility Overal bed mobility: Needs Assistance Bed Mobility: Sit to Supine;Supine to Sit     Supine to sit: Min assist Sit to supine: Mod assist   General bed mobility comments: Pt was able to show good effort in getting to EOB, sats down to low 80s with the effort  Transfers Overall transfer level: Needs assistance Equipment used: Rolling walker (2 wheeled) Transfers: Sit to/from Stand Sit to Stand: Min assist;Mod assist         General transfer comment: Pt showed good effort but was lethargic and needed a lot of cuing/assist  Ambulation/Gait Ambulation/Gait assistance: Mod assist Ambulation Distance (Feet): 3 Feet Assistive device: Rolling walker (2 wheeled)       General Gait Details: Pt able to do some limited side stepping along EOB with heavy encouragement, cuing and reminders to breath through her nose.   Stairs            Wheelchair Mobility    Modified Rankin (Stroke Patients Only)       Balance Overall balance  assessment: Needs assistance Sitting-balance support: Bilateral upper extremity supported Sitting balance-Leahy Scale: Fair     Standing balance support: Bilateral upper extremity supported Standing balance-Leahy Scale: Poor Standing balance comment: heavily reliant on the walker                            Cognition Arousal/Alertness: Lethargic Behavior During Therapy: (sleepy, shallow mouth breathing) Overall Cognitive Status: Within Functional Limits for tasks assessed                                        Exercises Total Joint Exercises Ankle Circles/Pumps: AROM;10 reps Quad Sets: Strengthening;10 reps Gluteal Sets: Strengthening;10 reps Short Arc Quad: AROM;10 reps Heel Slides: AAROM;10 reps Hip ABduction/ADduction: AROM;AAROM;10 reps Straight Leg Raises: AAROM;10 reps    General Comments        Pertinent Vitals/Pain Pain Assessment: (not rated, c/o pain with some ROM acts but nothing severe)    Home Living                      Prior Function            PT Goals (current goals can now be found in the care plan section) Progress towards PT goals: Progressing toward goals    Frequency    BID  PT Plan Current plan remains appropriate    Co-evaluation              AM-PAC PT "6 Clicks" Daily Activity  Outcome Measure  Difficulty turning over in bed (including adjusting bedclothes, sheets and blankets)?: Unable Difficulty moving from lying on back to sitting on the side of the bed? : Unable Difficulty sitting down on and standing up from a chair with arms (e.g., wheelchair, bedside commode, etc,.)?: Unable Help needed moving to and from a bed to chair (including a wheelchair)?: Total Help needed walking in hospital room?: Total Help needed climbing 3-5 steps with a railing? : Total 6 Click Score: 6    End of Session Equipment Utilized During Treatment: Gait belt;Oxygen Activity Tolerance: Patient limited  by fatigue;Patient limited by lethargy Patient left: with bed alarm set;with call bell/phone within reach Nurse Communication: (low O2 sats t/o the session, generally poor demeanor) PT Visit Diagnosis: Muscle weakness (generalized) (M62.81);Difficulty in walking, not elsewhere classified (R26.2)     Time: 1445-1510 PT Time Calculation (min) (ACUTE ONLY): 25 min  Charges:  $Therapeutic Exercise: 8-22 mins $Therapeutic Activity: 8-22 mins                    G Codes:       Kreg Shropshire, DPT 04/23/2017, 4:18 PM

## 2017-04-23 NOTE — Progress Notes (Signed)
[facility] [place] NAME: Samantha Mcmillan    MR#:  [id number]  DATE OF BIRTH:  [birth year]  SUBJECTIVE:   Patient here after a fall and noted to have a left hip fracture. S/p Left Hip Hemiarthroplasty POD # 2.  Patient's blood sugars are stable today, still complaining of some left hip pain due to surgical site, O2 sats have been labile but improved with oxygen. Patient developed some urinary retention and therefore has a Foley catheter now  REVIEW OF SYSTEMS:    Review of Systems  Constitutional: Negative for chills and fever.  HENT: Negative for congestion and tinnitus.   Eyes: Negative for blurred vision and double vision.  Respiratory: Negative for cough, shortness of breath and wheezing.   Cardiovascular: Negative for chest pain, orthopnea and PND.  Gastrointestinal: Negative for abdominal pain, diarrhea, nausea and vomiting.  Genitourinary: Negative for dysuria and hematuria.  Musculoskeletal: Positive for falls and joint pain (Left Hip).  Neurological: Negative for dizziness, sensory change and focal weakness.  All other systems reviewed and are negative.   Nutrition: Heart healthy/Carb control Tolerating Diet: Yes Tolerating PT: Eval noted.   DRUG ALLERGIES:   Allergies  Allergen Reactions  . Iodine Shortness Of Breath  . Latex Anaphylaxis  . Other Rash, Other (See Comments), Shortness Of Breath, Anaphylaxis and Swelling    Pt states that she is allergic to Darvocet.   . Oxycodone Shortness Of Breath, Rash and Swelling    Other reaction(s): Other (See Comments) wheezing  . Oxycodone-Acetaminophen Other (See Comments), Rash, Swelling and Shortness Of Breath    wheezing wheezing  . Shellfish-Derived Products Anaphylaxis, Rash, Shortness Of Breath and Swelling    Throat closes.  . Amitriptyline Other (See Comments)    Other reaction(s): Other (See Comments) Mental changes Other reaction(s): Other (See Comments) Altered  mental status, agitation Reaction:  Mental changes   . Fish-Derived Products Other (See Comments) and Swelling    Other reaction(s): Other (See Comments) "respiratory distress and wheezing" "respiratory distress and wheezing"  . Tape Other (See Comments)    Other reaction(s): Other (See Comments) Pulls skin off Pt states that it pulls her skin off.  Pt states that it pulls her skin off.   . Wellbutrin [Bupropion] Other (See Comments)    Other reaction(s): Other (See Comments) Mental changes Reaction:  Mental changes   . Augmentin [Amoxicillin-Pot Clavulanate] Rash and Other (See Comments)    Has patient had a PCN reaction causing immediate rash, facial/tongue/throat swelling, SOB or lightheadedness with hypotension: No Has patient had a PCN reaction causing severe rash involving mucus membranes or skin necrosis: No Has patient had a PCN reaction that required hospitalization No Has patient had a PCN reaction occurring within the last 10 years: No If all of the above answers are "NO", then may proceed with Cephalosporin use.  . Codeine Rash  . Cortisone Rash  . Diphenhydramine Rash  . Hydrocodone-Acetaminophen Rash    dizzy  . Vicodin [Hydrocodone-Acetaminophen] Rash and Other (See Comments)    Reaction:  Dizziness      VITALS:  Blood pressure (!) 120/59, pulse 95, temperature 98.6 F (37 C), temperature source Oral, resp. rate 19, height 5\' 4"  (1.626 m), weight 71.7 kg (158 lb), SpO2 100 %.  PHYSICAL EXAMINATION:   Physical Exam  GENERAL:  62 y.o.-year-old patient lying in bed in NAD.   EYES: Pupils equal, round, reactive to light and accommodation. No scleral icterus. Extraocular muscles intact.  HEENT: Head atraumatic, normocephalic. Oropharynx and nasopharynx clear.  NECK:  Supple, no jugular venous distention. No thyroid enlargement, no tenderness.  LUNGS: Normal breath sounds bilaterally, minimal wheezing b/l, No rales, rhonchi. No use of accessory muscles of  respiration.  CARDIOVASCULAR: S1, S2 normal. No murmurs, rubs, or gallops.  ABDOMEN: Soft, nontender, nondistended. Bowel sounds present. No organomegaly or mass.  EXTREMITIES: No cyanosis, clubbing or edema b/l.  left hip dressing in place from recent surgery. No acute bleeding noted.  NEUROLOGIC: Cranial nerves II through XII are intact. No focal Motor or sensory deficits b/l.  Globally weak.  PSYCHIATRIC: The patient is alert and oriented x 3.  SKIN: No obvious rash, lesion, or ulcer.   Foley cath in place with Yellow urine draining.    LABORATORY PANEL:   CBC Recent Labs  Lab 04/23/17 0704  WBC 10.0  HGB 8.5*  HCT 24.8*  PLT 106*   ------------------------------------------------------------------------------------------------------------------  Chemistries  Recent Labs  Lab 03/29/2017 2330  04/23/17 0704  NA 131*   < > 128*  K 3.9   < > 3.9  CL 91*   < > 97*  CO2 29   < > 23  GLUCOSE 115*   < > 137*  BUN 52*   < > 35*  CREATININE 2.03*   < > 1.27*  CALCIUM 8.1*   < > 7.7*  AST 79*  --   --   ALT 35  --   --   ALKPHOS 252*  --   --   BILITOT 0.7  --   --    < > = values in this interval not displayed.   ------------------------------------------------------------------------------------------------------------------  Cardiac Enzymes Recent Labs  Lab 04/20/2017 1018  TROPONINI <0.03   ------------------------------------------------------------------------------------------------------------------  RADIOLOGY:  Dg Hip Port Unilat With Pelvis 1v Left  Result Date: 04/12/2017 CLINICAL DATA:  Status post total hip replacement EXAM: DG HIP (WITH OR WITHOUT PELVIS) 2V PORT LEFT COMPARISON:  April 20, 2017 FINDINGS: Frontal pelvis as well as lateral left hip images were obtained. There is a total hip replacement on the left with prosthetic components well-seated. No acute fracture or dislocation currently. There is moderate narrowing of the right hip joint. Bones  are osteoporotic. There is aortoiliac atherosclerosis. There is common and superficial femoral artery calcification bilaterally. There is postoperative change on the left. IMPRESSION: Total hip replacement on the left with prosthetic component well-seated. No acute fracture or dislocation. Moderate narrowing right hip joint. Bones osteoporotic.  Aortoiliac and femoral artery atherosclerosis. Aortic Atherosclerosis (ICD10-I70.0). Electronically Signed   By: Lowella Grip III M.D.   On: 04/10/2017 18:14     ASSESSMENT AND PLAN:   62 yo female w/ hx of DM, HTN, Hyperlipidemia, hx of Cirrhosis, Chronic Bronchitis, Anxiety, Anemia, GERD, Depression, PTSD, Chronic Diastolic CHF, hx of Panic Attack who presented to the hospital due to a fall and noted to have a Left Hip Fracture.   1. S/p Fall and Left Hip Fracture - status post left hip hemiarthroplasty. Postoperative day #2 today. -Continue physical therapy as tolerated, continue pain control with PRN Tramadol.  Likely discharge to rehabilitation next 1-2 days.   2. History of chronic diastolic CHF-patient clinically is not in congestive heart failure. -Continue Aldactone, metoprolol  3. History of gout-continue allopurinol  4. Hypertension-patient is status post Foley catheter now. We'll start on some Flomax, do a voiding trial tomorrow  5. Diabetes type 2 without complication-no further hypoglycemia overnight, continue sensitive sliding scale insulin. Blood sugar  stable.  6. Hypothyroidism-continue Synthroid.  7. History of PTSD-continue Lamictal.  8. Essential hypertension-continue metoprolol.  9. GERD-continue Protonix.  10. Depression-continue Zoloft.  Possible d/c to SNF/STR tomorrow.   All the records are reviewed and case discussed with Care Management/Social Worker. Management plans discussed with the patient, family and they are in agreement.  CODE STATUS: Full code  DVT Prophylaxis: Lovenox   TOTAL TIME TAKING CARE  OF THIS PATIENT: 25 minutes.   POSSIBLE D/C IN 1-2 DAYS, DEPENDING ON CLINICAL CONDITION.   Henreitta Leber M.D on 04/23/2017 at 1:31 PM  Between 7am to 6pm - Pager - [phone]  After 6pm go to [email or web address] - Proofreader  [facility]  Office  [phone]  CC: Primary care physician; Casilda Carls, MD

## 2017-04-23 NOTE — Progress Notes (Signed)
ANTICOAGULATION CONSULT NOTE - Initial Consult  Pharmacy Consult for enoxaparin dosing Indication: DVT prophylaxis post-op  Allergies  Allergen Reactions  . Iodine Shortness Of Breath  . Latex Anaphylaxis  . Other Rash, Other (See Comments), Shortness Of Breath, Anaphylaxis and Swelling    Pt states that she is allergic to Darvocet.   . Oxycodone Shortness Of Breath, Rash and Swelling    Other reaction(s): Other (See Comments) wheezing  . Oxycodone-Acetaminophen Other (See Comments), Rash, Swelling and Shortness Of Breath    wheezing wheezing  . Shellfish-Derived Products Anaphylaxis, Rash, Shortness Of Breath and Swelling    Throat closes.  . Amitriptyline Other (See Comments)    Other reaction(s): Other (See Comments) Mental changes Other reaction(s): Other (See Comments) Altered mental status, agitation Reaction:  Mental changes   . Fish-Derived Products Other (See Comments) and Swelling    Other reaction(s): Other (See Comments) "respiratory distress and wheezing" "respiratory distress and wheezing"  . Tape Other (See Comments)    Other reaction(s): Other (See Comments) Pulls skin off Pt states that it pulls her skin off.  Pt states that it pulls her skin off.   . Wellbutrin [Bupropion] Other (See Comments)    Other reaction(s): Other (See Comments) Mental changes Reaction:  Mental changes   . Augmentin [Amoxicillin-Pot Clavulanate] Rash and Other (See Comments)    Has patient had a PCN reaction causing immediate rash, facial/tongue/throat swelling, SOB or lightheadedness with hypotension: No Has patient had a PCN reaction causing severe rash involving mucus membranes or skin necrosis: No Has patient had a PCN reaction that required hospitalization No Has patient had a PCN reaction occurring within the last 10 years: No If all of the above answers are "NO", then may proceed with Cephalosporin use.  . Codeine Rash  . Cortisone Rash  . Diphenhydramine Rash  .  Hydrocodone-Acetaminophen Rash    dizzy  . Vicodin [Hydrocodone-Acetaminophen] Rash and Other (See Comments)    Reaction:  Dizziness      Patient Measurements: Height: 5\' 4"  (162.6 cm) Weight: 158 lb (71.7 kg) IBW/kg (Calculated) : 54.7 Heparin Dosing Weight:   Vital Signs: Temp: 98.6 F (37 C) (03/01 0808) Temp Source: Oral (03/01 0808) BP: 120/59 (03/01 0808) Pulse Rate: 95 (03/01 0808)  Labs: Recent Labs    04/02/2017 2256  04/16/2017 2330 04/08/2017 0421 04/17/2017 1018 04/22/17 0340 04/23/17 0704  HGB 11.2*  --   --  10.1*  --  8.9* 8.5*  HCT 34.6*  --   --  30.1*  --  27.3* 24.8*  PLT 129*  --   --  126*  --  143* 106*  LABPROT  --   --  14.3 15.0  --   --   --   INR  --   --  1.12 1.19  --   --   --   CREATININE  --    < > 2.03* 1.82*  --  1.41* 1.27*  TROPONINI <0.03  --   --  <0.03 <0.03  --   --    < > = values in this interval not displayed.    Estimated Creatinine Clearance: 44 mL/min (A) (by C-G formula based on SCr of 1.27 mg/dL (H)).   Medical History: Past Medical History:  Diagnosis Date  . Acute on chronic diastolic CHF (congestive heart failure) ([place]) 05/16/2014  . Anemia   . Anxiety   . Arthritis    "some in my feet" (05/04/2014)  . Asthma   .  Chronic bronchitis ([place])    "get it pretty much q yr"   . Chronic lower back pain   . Chronic respiratory failure ([place])   . Cirrhosis of liver ([place])   . Cirrhosis of liver ([place])   . Colon polyp   . COPD (chronic obstructive pulmonary disease) ([place])   . Depression   . Family history of adverse reaction to anesthesia    "my sister doesn't wake up good when she's put deep to sleep"  . GERD (gastroesophageal reflux disease)   . Headache    "more than a couple times/wk" (05/04/2014)  . Heart murmur   . High cholesterol   . History of blood transfusion    "related to my anemia"  . Hypertension   . IBS (irritable bowel syndrome)   . IDA (iron deficiency anemia) 08/14/2014  . Iron deficiency anemia   . OCD  (obsessive compulsive disorder)   . On home oxygen therapy    "2L; 24/7" (05/04/2014)  . Panic attack   . Personal history of tobacco use, presenting hazards to health 06/21/2015  . Pneumonia "several times"  . PTSD (post-traumatic stress disorder)   . Sleep apnea    "can't tolerate CPAP" (05/04/2014)  . Type II diabetes mellitus (HCC)     Medications:  Infusions:  . sodium chloride 75 mL/hr at 04/23/17 0649  . methocarbamol (ROBAXIN)  IV      Assessment: 61 yof cc hip pain from fall. Now THA POD 2.   Goal of Therapy:  Prevent VTE Monitor platelets by anticoagulation protocol: Yes   Plan:  Enoxaparin 40 mg subcutaneously Q24H  Laural Benes, Pharm.D., BCPS Clinical Pharmacist 04/23/2017,8:15 AM

## 2017-04-23 NOTE — Discharge Summary (Signed)
[facility] [place] NAME: Samantha Mcmillan    MR#:  [id number]  DATE OF BIRTH:  February 28, 1955  DATE OF ADMISSION:  04/01/2017 ADMITTING PHYSICIAN: Saundra Shelling, MD  DATE OF DISCHARGE: No discharge date for patient encounter.  PRIMARY CARE PHYSICIAN: Casilda Carls, MD    ADMISSION DIAGNOSIS:  Closed fracture of left hip, initial encounter ([place]) [S72.002A] Fall, initial encounter [W19.XXXA]  DISCHARGE DIAGNOSIS:  Active Problems:   Hip fracture ([place])   SECONDARY DIAGNOSIS:   Past Medical History:  Diagnosis Date  . Acute on chronic diastolic CHF (congestive heart failure) ([place]) 05/16/2014  . Anemia   . Anxiety   . Arthritis    "some in my feet" (05/04/2014)  . Asthma   . Chronic bronchitis ([place])    "get it pretty much q yr"   . Chronic lower back pain   . Chronic respiratory failure ([place])   . Cirrhosis of liver ([place])   . Cirrhosis of liver ([place])   . Colon polyp   . COPD (chronic obstructive pulmonary disease) ([place])   . Depression   . Family history of adverse reaction to anesthesia    "my sister doesn't wake up good when she's put deep to sleep"  . GERD (gastroesophageal reflux disease)   . Headache    "more than a couple times/wk" (05/04/2014)  . Heart murmur   . High cholesterol   . History of blood transfusion    "related to my anemia"  . Hypertension   . IBS (irritable bowel syndrome)   . IDA (iron deficiency anemia) 08/14/2014  . Iron deficiency anemia   . OCD (obsessive compulsive disorder)   . On home oxygen therapy    "2L; 24/7" (05/04/2014)  . Panic attack   . Personal history of tobacco use, presenting hazards to health 06/21/2015  . Pneumonia "several times"  . PTSD (post-traumatic stress disorder)   . Sleep apnea    "can't tolerate CPAP" (05/04/2014)  . Type II diabetes mellitus ([place])     HOSPITAL COURSE:   62 yo female w/ hx of DM, HTN, Hyperlipidemia, hx of Cirrhosis, Chronic Bronchitis, Anxiety, Anemia,  GERD, Depression, PTSD, Chronic Diastolic CHF, hx of Panic Attack who presented to the hospital due to a fall and noted to have a Left Hip Fracture.   1. S/p Fall and Left Hip Fracture - status post left hip hemiarthroplasty. Postoperative day #2 today. -Continue physical therapy as tolerated, continue pain control with PRN Tramadol.  Likely discharge to rehabilitation in 1-2 days.    2. History of chronic diastolic CHF-patient clinically is not in congestive heart failure. -Continue Aldactone, metoprolol  3. History of gout-continue allopurinol  4. Hypertension-patient is status post Foley catheter now. We'll start on some Flomax, do a voiding trial tomorrow  5. Diabetes type 2 without complication-no further hypoglycemia overnight, continue sensitive sliding scale insulin. Blood sugar stable.  6. Hypothyroidism-continue Synthroid.  7. History of PTSD-continue Lamictal.  8. Essential hypertension-continue metoprolol.  9. GERD-continue Protonix.  10. Depression-continue Zoloft.    DISCHARGE CONDITIONS:   Stable.   CONSULTS OBTAINED:  Treatment Team:  Thornton Park, MD Corey Skains, MD  DRUG ALLERGIES:   Allergies  Allergen Reactions  . Iodine Shortness Of Breath  . Latex Anaphylaxis  . Other Rash, Other (See Comments), Shortness Of Breath, Anaphylaxis and Swelling    Pt states that she is allergic to Darvocet.   . Oxycodone Shortness Of Breath, Rash and Swelling  Other reaction(s): Other (See Comments) wheezing  . Oxycodone-Acetaminophen Other (See Comments), Rash, Swelling and Shortness Of Breath    wheezing wheezing  . Shellfish-Derived Products Anaphylaxis, Rash, Shortness Of Breath and Swelling    Throat closes.  . Amitriptyline Other (See Comments)    Other reaction(s): Other (See Comments) Mental changes Other reaction(s): Other (See Comments) Altered mental status, agitation Reaction:  Mental changes   . Fish-Derived Products Other  (See Comments) and Swelling    Other reaction(s): Other (See Comments) "respiratory distress and wheezing" "respiratory distress and wheezing"  . Tape Other (See Comments)    Other reaction(s): Other (See Comments) Pulls skin off Pt states that it pulls her skin off.  Pt states that it pulls her skin off.   . Wellbutrin [Bupropion] Other (See Comments)    Other reaction(s): Other (See Comments) Mental changes Reaction:  Mental changes   . Augmentin [Amoxicillin-Pot Clavulanate] Rash and Other (See Comments)    Has patient had a PCN reaction causing immediate rash, facial/tongue/throat swelling, SOB or lightheadedness with hypotension: No Has patient had a PCN reaction causing severe rash involving mucus membranes or skin necrosis: No Has patient had a PCN reaction that required hospitalization No Has patient had a PCN reaction occurring within the last 10 years: No If all of the above answers are "NO", then may proceed with Cephalosporin use.  . Codeine Rash  . Cortisone Rash  . Diphenhydramine Rash  . Hydrocodone-Acetaminophen Rash    dizzy  . Vicodin [Hydrocodone-Acetaminophen] Rash and Other (See Comments)    Reaction:  Dizziness      DISCHARGE MEDICATIONS:   Allergies as of 04/23/2017      Reactions   Iodine Shortness Of Breath   Latex Anaphylaxis   Other Rash, Other (See Comments), Shortness Of Breath, Anaphylaxis, Swelling   Pt states that she is allergic to Darvocet.    Oxycodone Shortness Of Breath, Rash, Swelling   Other reaction(s): Other (See Comments) wheezing   Oxycodone-acetaminophen Other (See Comments), Rash, Swelling, Shortness Of Breath   wheezing wheezing   Shellfish-derived Products Anaphylaxis, Rash, Shortness Of Breath, Swelling   Throat closes.   Amitriptyline Other (See Comments)   Other reaction(s): Other (See Comments) Mental changes Other reaction(s): Other (See Comments) Altered mental status, agitation Reaction:  Mental changes     Fish-derived Products Other (See Comments), Swelling   Other reaction(s): Other (See Comments) "respiratory distress and wheezing" "respiratory distress and wheezing"   Tape Other (See Comments)   Other reaction(s): Other (See Comments) Pulls skin off Pt states that it pulls her skin off.  Pt states that it pulls her skin off.    Wellbutrin [bupropion] Other (See Comments)   Other reaction(s): Other (See Comments) Mental changes Reaction:  Mental changes    Augmentin [amoxicillin-pot Clavulanate] Rash, Other (See Comments)   Has patient had a PCN reaction causing immediate rash, facial/tongue/throat swelling, SOB or lightheadedness with hypotension: No Has patient had a PCN reaction causing severe rash involving mucus membranes or skin necrosis: No Has patient had a PCN reaction that required hospitalization No Has patient had a PCN reaction occurring within the last 10 years: No If all of the above answers are "NO", then may proceed with Cephalosporin use.   Codeine Rash   Cortisone Rash   Diphenhydramine Rash   Hydrocodone-acetaminophen Rash   dizzy   Vicodin [hydrocodone-acetaminophen] Rash, Other (See Comments)   Reaction:  Dizziness        Medication List  TAKE these medications   albuterol (2.5 MG/3ML) 0.083% nebulizer solution Commonly known as:  PROVENTIL Take 2.5 mg by nebulization every 6 (six) hours as needed for wheezing or shortness of breath.   PROAIR HFA 108 (90 Base) MCG/ACT inhaler Generic drug:  albuterol ProAir HFA 90 mcg/actuation aerosol inhaler   allopurinol 100 MG tablet Commonly known as:  ZYLOPRIM Take 100 mg by mouth daily.   ARIPiprazole 5 MG tablet Commonly known as:  ABILIFY Take 5 mg by mouth at bedtime.   atorvastatin 20 MG tablet Commonly known as:  LIPITOR Take 20 mg by mouth daily.   budesonide-formoterol 80-4.5 MCG/ACT inhaler Commonly known as:  SYMBICORT Inhale 2 puffs into the lungs 2 (two) times daily.   bumetanide 1 MG  tablet Commonly known as:  BUMEX Take 2 mg by mouth 2 (two) times daily.   CALTRATE 600+D PO Take 1 tablet by mouth daily.   enoxaparin 40 MG/0.4ML injection Commonly known as:  LOVENOX Inject 0.4 mLs (40 mg total) into the skin daily for 14 days. Start taking on:  04/24/2017   ferrous fumarate-iron polysaccharide complex 162-115.2 MG Caps capsule Commonly known as:  TANDEM Take 1 capsule by mouth 2 (two) times daily after a meal.   HYDROmorphone 2 MG tablet Commonly known as:  DILAUDID Take 1 tablet (2 mg total) by mouth every 6 (six) hours as needed for severe pain. What changed:  when to take this   insulin glargine 100 UNIT/ML injection Commonly known as:  LANTUS Inject 30 Units into the skin every morning.   ipratropium-albuterol 0.5-2.5 (3) MG/3ML Soln Commonly known as:  DUONEB Take 3 mLs by nebulization every 4 (four) hours as needed.   lamoTRIgine 150 MG tablet Commonly known as:  LAMICTAL Take 150 mg by mouth at bedtime.   levothyroxine 88 MCG tablet Commonly known as:  SYNTHROID, LEVOTHROID Take 88 mcg by mouth daily before breakfast.   linaclotide 290 MCG Caps capsule Commonly known as:  LINZESS Take 1 capsule (290 mcg total) daily before breakfast by mouth.   lisinopril 20 MG tablet Commonly known as:  PRINIVIL,ZESTRIL Take 20 mg by mouth daily.   magnesium oxide 400 MG tablet Commonly known as:  MAG-OX Take 1 tablet by mouth 2 (two) times daily.   metolazone 5 MG tablet Commonly known as:  ZAROXOLYN Take 5 mg by mouth daily.   metoprolol tartrate 50 MG tablet Commonly known as:  LOPRESSOR Take 50 mg by mouth 2 (two) times daily.   multivitamin with minerals Tabs tablet Take 1 tablet by mouth daily.   pantoprazole 40 MG tablet Commonly known as:  PROTONIX Take 40 mg by mouth daily.   sertraline 100 MG tablet Commonly known as:  ZOLOFT Take 200 mg by mouth daily.   spironolactone 25 MG tablet Commonly known as:  ALDACTONE Take 25 mg by  mouth daily.   spironolactone 50 MG tablet Commonly known as:  ALDACTONE Take 1 tablet (50 mg total) by mouth daily.   tamsulosin 0.4 MG Caps capsule Commonly known as:  FLOMAX Take 1 capsule (0.4 mg total) by mouth daily. Start taking on:  04/24/2017   traMADol 50 MG tablet Commonly known as:  ULTRAM Take 1 tablet (50 mg total) by mouth every 6 (six) hours as needed for moderate pain.         DISCHARGE INSTRUCTIONS:   DIET:  Cardiac diet  DISCHARGE CONDITION:  Stable  ACTIVITY:  Activity as tolerated  OXYGEN:  Home Oxygen: No.  Oxygen Delivery: room air  DISCHARGE LOCATION:  nursing home   If you experience worsening of your admission symptoms, develop shortness of breath, life threatening emergency, suicidal or homicidal thoughts you must seek medical attention immediately by calling 911 or calling your MD immediately  if symptoms less severe.  You Must read complete instructions/literature along with all the possible adverse reactions/side effects for all the Medicines you take and that have been prescribed to you. Take any new Medicines after you have completely understood and accpet all the possible adverse reactions/side effects.   Please note  You were cared for by a hospitalist during your hospital stay. If you have any questions about your discharge medications or the care you received while you were in the hospital after you are discharged, you can call the unit and asked to speak with the hospitalist on call if the hospitalist that took care of you is not available. Once you are discharged, your primary care physician will handle any further medical issues. Please note that NO REFILLS for any discharge medications will be authorized once you are discharged, as it is imperative that you return to your primary care physician (or establish a relationship with a primary care physician if you do not have one) for your aftercare needs so that they can reassess your  need for medications and monitor your lab values.     Today   Still having pain near surgical site. Had some Urinary retention and now s/p Foley.   VITAL SIGNS:  Blood pressure (!) 120/59, pulse 95, temperature 98.6 F (37 C), temperature source Oral, resp. rate 19, height 5\' 4"  (1.626 m), weight 71.7 kg (158 lb), SpO2 100 %.  I/O:    Intake/Output Summary (Last 24 hours) at 04/23/2017 1457 Last data filed at 04/23/2017 1343 Gross per 24 hour  Intake 985 ml  Output 800 ml  Net 185 ml    PHYSICAL EXAMINATION:   GENERAL:  62 y.o.-year-old patient lying in bed in NAD.   EYES: Pupils equal, round, reactive to light and accommodation. No scleral icterus. Extraocular muscles intact.  HEENT: Head atraumatic, normocephalic. Oropharynx and nasopharynx clear.  NECK:  Supple, no jugular venous distention. No thyroid enlargement, no tenderness.  LUNGS: Normal breath sounds bilaterally, minimal wheezing b/l, No rales, rhonchi. No use of accessory muscles of respiration.  CARDIOVASCULAR: S1, S2 normal. No murmurs, rubs, or gallops.  ABDOMEN: Soft, nontender, nondistended. Bowel sounds present. No organomegaly or mass.  EXTREMITIES: No cyanosis, clubbing or edema b/l.  left hip dressing in place from recent surgery. No acute bleeding noted.  NEUROLOGIC: Cranial nerves II through XII are intact. No focal Motor or sensory deficits b/l.  Globally weak.  PSYCHIATRIC: The patient is alert and oriented x 3.  SKIN: No obvious rash, lesion, or ulcer.   Foley cath in place with Yellow urine draining.     DATA REVIEW:   CBC Recent Labs  Lab 04/23/17 0704  WBC 10.0  HGB 8.5*  HCT 24.8*  PLT 106*    Chemistries  Recent Labs  Lab 04/19/2017 2330  04/23/17 0704  NA 131*   < > 128*  K 3.9   < > 3.9  CL 91*   < > 97*  CO2 29   < > 23  GLUCOSE 115*   < > 137*  BUN 52*   < > 35*  CREATININE 2.03*   < > 1.27*  CALCIUM 8.1*   < > 7.7*  AST 79*  --   --  ALT 35  --   --   ALKPHOS 252*   --   --   BILITOT 0.7  --   --    < > = values in this interval not displayed.    Cardiac Enzymes Recent Labs  Lab 03/31/2017 1018  TROPONINI <0.03    Microbiology Results  Results for orders placed or performed during the hospital encounter of 04/13/2017  Surgical PCR screen     Status: None   Collection Time: 03/27/2017 12:09 AM  Result Value Ref Range Status   MRSA, PCR NEGATIVE NEGATIVE Final   Staphylococcus aureus NEGATIVE NEGATIVE Final    Comment: (NOTE) The Xpert SA Assay (FDA approved for NASAL specimens in patients 50 years of age and older), is one component of a comprehensive surveillance program. It is not intended to diagnose infection nor to guide or monitor treatment. Performed at [facility], [address] [place], [place] [address]     RADIOLOGY:  Dg Hip Port Unilat With Pelvis 1v Left  Result Date: 04/20/2017 CLINICAL DATA:  Status post total hip replacement EXAM: DG HIP (WITH OR WITHOUT PELVIS) 2V PORT LEFT COMPARISON:  April 20, 2017 FINDINGS: Frontal pelvis as well as lateral left hip images were obtained. There is a total hip replacement on the left with prosthetic components well-seated. No acute fracture or dislocation currently. There is moderate narrowing of the right hip joint. Bones are osteoporotic. There is aortoiliac atherosclerosis. There is common and superficial femoral artery calcification bilaterally. There is postoperative change on the left. IMPRESSION: Total hip replacement on the left with prosthetic component well-seated. No acute fracture or dislocation. Moderate narrowing right hip joint. Bones osteoporotic.  Aortoiliac and femoral artery atherosclerosis. Aortic Atherosclerosis (ICD10-I70.0). Electronically Signed   By: Lowella Grip III M.D.   On: 04/03/2017 18:14      Management plans discussed with the patient, family and they are in agreement.  CODE STATUS:     Code Status Orders  (From admission, onward)         Start     Ordered   03/29/2017 0009  Full code  Continuous     04/09/2017 0008    Advance Directive Documentation     Most Recent Value  Type of Advance Directive  Healthcare Power of Attorney, Living will  Pre-existing out of facility DNR order (yellow form or pink MOST form)  No data  "MOST" Form in Place?  No data      TOTAL TIME TAKING CARE OF THIS PATIENT: 40 minutes.    Henreitta Leber M.D on 04/23/2017 at 2:57 PM  Between 7am to 6pm - Pager - [phone]  After 6pm go to [email or web address] - Proofreader  [facility]  Office  [phone]  CC: Primary care physician; Casilda Carls, MD

## 2017-04-23 NOTE — Consult Note (Signed)
[facility] Nurse wound consult note Reason for Consult:Blistering to bilateral lower legs.  No edema noted.  Moisture associated skin damage to abdominal folds.  Intertriginous dermatitis Wound type:moisture associated skin damage Pressure Injury POA: NA Measurement: 1 cm serum filled blisters to lower legs Erythema and weeping to abdominal pannus Wound bed: intact blisters Drainage (amount, consistency, odor) scant weeping Periwound:intact Dressing procedure/placement/frequency:Interdry Ag to abdominal pannus:  Measure and cut length of InterDry Ag+ to fit in skin folds that have skin breakdown Tuck InterDry  Ag+ fabric into skin folds in a single layer, allow for 2 inches of overhang from skin edges to allow for wicking to occur May remove to bathe; dry area thoroughly and then tuck into affected areas again Do not apply any creams or ointments when using InterDry Ag+ DO NOT THROW AWAY FOR 5 DAYS unless soiled with stool DO NOT [facility] product, this will inactivate the silver in the material  New sheet of Interdry Ag+ should be applied after 5 days of use if patient continues to have skin breakdown   Vaseline gauze to blisters on lower legs.  Cover with 4x4 gauze and kerlix.  Change daily.  Will not follow at this time.  Please re-consult if needed.  Domenic Moras RN BSN [place] Pager [phone]

## 2017-04-23 NOTE — Progress Notes (Signed)
Physical Therapy Treatment Patient Details Name: Samantha Mcmillan MRN: [id number] DOB: [birth year] Today's Date: 04/23/2017    History of Present Illness 62 y/o female s/p fall with L hip fracture and subsequent hemiarthroplasty    PT Comments    Limited session secondary to lethargy and decreased O2 levels at rest.  Pt has some labored breathing at rest, with sats 83-85% on 2 liters, nursing notified (got similar readings with 3 different PulseOx) and we bumped up to 3 liters with some transient improvement in sats, though still uncomfortable and labored with breathing.  We were able to get a few minimal supine exercises in but ultimately she showed poor tolerance with both pain and breathing.   Follow Up Recommendations  SNF     Equipment Recommendations       Recommendations for Other Services       Precautions / Restrictions Precautions Precautions: Fall;Posterior Hip Restrictions LLE Weight Bearing: Weight bearing as tolerated    Mobility  Bed Mobility               General bed mobility comments: deferred mobility secondary to labored breathing, lethargy and sats being in the mids 80s on 2 (then 3) liters with focused nasal breathing  Transfers                    Ambulation/Gait                 Stairs            Wheelchair Mobility    Modified Rankin (Stroke Patients Only)       Balance                                            Cognition Arousal/Alertness: Lethargic Behavior During Therapy: (lethargic/sleepy, able to wake and follow some instruction) Overall Cognitive Status: Within Functional Limits for tasks assessed                                        Exercises Total Joint Exercises Ankle Circles/Pumps: AROM;10 reps Quad Sets: Strengthening;10 reps Gluteal Sets: Strengthening;10 reps Heel Slides: AAROM;10 reps Hip ABduction/ADduction: 10 reps;AAROM    General Comments         Pertinent Vitals/Pain Pain Assessment: (not rated indicates considerable pain w/ even minimal effort)    Home Living                      Prior Function            PT Goals (current goals can now be found in the care plan section) Progress towards PT goals: Not progressing toward goals - comment(lethargic, low O2 sats at rest)    Frequency    BID      PT Plan Current plan remains appropriate    Co-evaluation              AM-PAC PT "6 Clicks" Daily Activity  Outcome Measure  Difficulty turning over in bed (including adjusting bedclothes, sheets and blankets)?: Unable Difficulty moving from lying on back to sitting on the side of the bed? : Unable Difficulty sitting down on and standing up from a chair with arms (e.g., wheelchair, bedside commode, etc,.)?: Unable Help needed moving to and from a  bed to chair (including a wheelchair)?: Total Help needed walking in hospital room?: Total Help needed climbing 3-5 steps with a railing? : Total 6 Click Score: 6    End of Session   Activity Tolerance: Patient limited by fatigue Patient left: with bed alarm set;with call bell/phone within reach;with family/visitor present Nurse Communication: (low O2 at rest on supplemental O2) PT Visit Diagnosis: Muscle weakness (generalized) (M62.81);Difficulty in walking, not elsewhere classified (R26.2)     Time: 2575-0518 PT Time Calculation (min) (ACUTE ONLY): 14 min  Charges:  $Therapeutic Exercise: 8-22 mins                    G Codes:       Kreg Shropshire, DPT 04/23/2017, 11:06 AM

## 2017-04-23 DEATH — deceased

## 2017-04-24 ENCOUNTER — Inpatient Hospital Stay: Payer: Medicare Other

## 2017-04-24 LAB — GLUCOSE, CAPILLARY
GLUCOSE-CAPILLARY: 126 mg/dL — AB (ref 65–99)
GLUCOSE-CAPILLARY: 181 mg/dL — AB (ref 65–99)
Glucose-Capillary: 132 mg/dL — ABNORMAL HIGH (ref 65–99)
Glucose-Capillary: 161 mg/dL — ABNORMAL HIGH (ref 65–99)

## 2017-04-24 LAB — CBC
HEMATOCRIT: 25.9 % — AB (ref 35.0–47.0)
Hemoglobin: 8.6 g/dL — ABNORMAL LOW (ref 12.0–16.0)
MCH: 32.1 pg (ref 26.0–34.0)
MCHC: 33.2 g/dL (ref 32.0–36.0)
MCV: 96.7 fL (ref 80.0–100.0)
PLATELETS: 111 10*3/uL — AB (ref 150–440)
RBC: 2.68 MIL/uL — ABNORMAL LOW (ref 3.80–5.20)
RDW: 15.6 % — AB (ref 11.5–14.5)
WBC: 9.1 10*3/uL (ref 3.6–11.0)

## 2017-04-24 LAB — PROCALCITONIN: Procalcitonin: 0.64 ng/mL

## 2017-04-24 LAB — STREP PNEUMONIAE URINARY ANTIGEN: Strep Pneumo Urinary Antigen: NEGATIVE

## 2017-04-24 MED ORDER — VANCOMYCIN HCL IN DEXTROSE 1-5 GM/200ML-% IV SOLN
1000.0000 mg | Freq: Once | INTRAVENOUS | Status: AC
  Administered 2017-04-24: 1000 mg via INTRAVENOUS
  Filled 2017-04-24 (×2): qty 200

## 2017-04-24 MED ORDER — SODIUM CHLORIDE 0.9 % IV SOLN
1.0000 g | Freq: Three times a day (TID) | INTRAVENOUS | Status: DC
  Administered 2017-04-24 – 2017-04-25 (×3): 1 g via INTRAVENOUS
  Filled 2017-04-24 (×5): qty 1

## 2017-04-24 MED ORDER — METHYLPREDNISOLONE SODIUM SUCC 125 MG IJ SOLR
60.0000 mg | Freq: Four times a day (QID) | INTRAMUSCULAR | Status: DC
  Administered 2017-04-24 – 2017-04-26 (×8): 60 mg via INTRAVENOUS
  Filled 2017-04-24 (×8): qty 2

## 2017-04-24 MED ORDER — FUROSEMIDE 10 MG/ML IJ SOLN
20.0000 mg | Freq: Two times a day (BID) | INTRAMUSCULAR | Status: DC
  Administered 2017-04-24 – 2017-04-25 (×2): 20 mg via INTRAVENOUS
  Filled 2017-04-24 (×2): qty 4

## 2017-04-24 MED ORDER — VANCOMYCIN HCL IN DEXTROSE 1-5 GM/200ML-% IV SOLN
1000.0000 mg | INTRAVENOUS | Status: DC
  Administered 2017-04-24 – 2017-04-25 (×2): 1000 mg via INTRAVENOUS
  Filled 2017-04-24 (×4): qty 200

## 2017-04-24 MED ORDER — FUROSEMIDE 10 MG/ML IJ SOLN
40.0000 mg | Freq: Once | INTRAMUSCULAR | Status: AC
  Administered 2017-04-24: 40 mg via INTRAVENOUS
  Filled 2017-04-24: qty 4

## 2017-04-24 MED ORDER — MORPHINE SULFATE (PF) 2 MG/ML IV SOLN
1.0000 mg | INTRAVENOUS | Status: DC | PRN
  Administered 2017-04-25 – 2017-04-26 (×3): 1 mg via INTRAVENOUS
  Filled 2017-04-24 (×3): qty 1

## 2017-04-24 MED ORDER — IPRATROPIUM-ALBUTEROL 0.5-2.5 (3) MG/3ML IN SOLN
3.0000 mL | Freq: Four times a day (QID) | RESPIRATORY_TRACT | Status: DC
  Administered 2017-04-24 – 2017-04-25 (×6): 3 mL via RESPIRATORY_TRACT
  Filled 2017-04-24 (×6): qty 3

## 2017-04-24 NOTE — Progress Notes (Signed)
PT Cancellation Note  Patient Details Name: Samantha Mcmillan MRN: [id number] DOB: April 07, 1955   Cancelled Treatment:    Reason Eval/Treat Not Completed: Patient not medically ready   Session held this am for low O2 sats.  Will continue as appropriate.   Chesley Noon 04/24/2017, 12:13 PM

## 2017-04-24 NOTE — Progress Notes (Signed)
Physical Therapy Treatment Patient Details Name: Samantha Mcmillan MRN: [id number] DOB: Sep 07, 1955 Today's Date: 04/24/2017    History of Present Illness 62 y/o female s/p fall with L hip fracture and subsequent hemiarthroplasty    PT Comments    Participated in exercises as described below.  Sats monitored though out session and remained in mid 90's.  Rest periods given as needed.  Primary nurse in during session and requested bed level exercises this session.   Follow Up Recommendations  SNF     Equipment Recommendations       Recommendations for Other Services       Precautions / Restrictions Precautions Precautions: Fall;Posterior Hip Restrictions Weight Bearing Restrictions: Yes LLE Weight Bearing: Weight bearing as tolerated    Mobility  Bed Mobility               General bed mobility comments: deferred per nsg  Transfers                    Ambulation/Gait                 Stairs            Wheelchair Mobility    Modified Rankin (Stroke Patients Only)       Balance                                            Cognition Arousal/Alertness: Lethargic Behavior During Therapy: WFL for tasks assessed/performed Overall Cognitive Status: Within Functional Limits for tasks assessed                                        Exercises Other Exercises Other Exercises: BLE A/AROM for heel slides, SLR, ab/add, ankle pumps and glut sets x 10    General Comments        Pertinent Vitals/Pain Pain Assessment: 0-10 Pain Score: 2  Pain Location: L hip Pain Descriptors / Indicators: Sore Pain Intervention(s): Limited activity within patient's tolerance    Home Living                      Prior Function            PT Goals (current goals can now be found in the care plan section) Progress towards PT goals: Progressing toward goals    Frequency    BID      PT Plan Current plan  remains appropriate    Co-evaluation              AM-PAC PT "6 Clicks" Daily Activity  Outcome Measure  Difficulty turning over in bed (including adjusting bedclothes, sheets and blankets)?: Unable Difficulty moving from lying on back to sitting on the side of the bed? : Unable Difficulty sitting down on and standing up from a chair with arms (e.g., wheelchair, bedside commode, etc,.)?: Unable Help needed moving to and from a bed to chair (including a wheelchair)?: Total Help needed walking in hospital room?: Total Help needed climbing 3-5 steps with a railing? : Total 6 Click Score: 6    End of Session Equipment Utilized During Treatment: Oxygen Activity Tolerance: Patient tolerated treatment well;Treatment limited secondary to medical complications (Comment) Patient left: in bed;with bed alarm set;with call bell/phone within reach;with  family/visitor present;with nursing/sitter in room Nurse Communication: Mobility status;Other (comment)       Time: 1003-4961 PT Time Calculation (min) (ACUTE ONLY): 16 min  Charges:  $Therapeutic Exercise: 8-22 mins                    G Codes:       Chesley Noon, PTA 04/24/17, 12:17 PM

## 2017-04-24 NOTE — Progress Notes (Signed)
[facility] NAME: Samantha Mcmillan    MR#:  [id number]  DATE OF BIRTH:  08-May-1955  SUBJECTIVE:  CHIEF COMPLAINT:   Chief Complaint  Patient presents with  . Hip Pain  . Fall    REVIEW OF SYSTEMS:  CONSTITUTIONAL: No fever, fatigue or weakness.  EYES: No blurred or double vision.  EARS, NOSE, AND THROAT: No tinnitus or ear pain.  RESPIRATORY: No cough, shortness of breath, wheezing or hemoptysis.  CARDIOVASCULAR: No chest pain, orthopnea, edema.  GASTROINTESTINAL: No nausea, vomiting, diarrhea or abdominal pain.  GENITOURINARY: No dysuria, hematuria.  ENDOCRINE: No polyuria, nocturia,  HEMATOLOGY: No anemia, easy bruising or bleeding SKIN: No rash or lesion. MUSCULOSKELETAL: No joint pain or arthritis.   NEUROLOGIC: No tingling, numbness, weakness.  PSYCHIATRY: No anxiety or depression.   ROS  DRUG ALLERGIES:   Allergies  Allergen Reactions  . Iodine Shortness Of Breath  . Latex Anaphylaxis  . Other Rash, Other (See Comments), Shortness Of Breath, Anaphylaxis and Swelling    Pt states that she is allergic to Darvocet.   . Oxycodone Shortness Of Breath, Rash and Swelling    Other reaction(s): Other (See Comments) wheezing  . Oxycodone-Acetaminophen Other (See Comments), Rash, Swelling and Shortness Of Breath    wheezing wheezing  . Shellfish-Derived Products Anaphylaxis, Rash, Shortness Of Breath and Swelling    Throat closes.  . Amitriptyline Other (See Comments)    Other reaction(s): Other (See Comments) Mental changes Other reaction(s): Other (See Comments) Altered mental status, agitation Reaction:  Mental changes   . Fish-Derived Products Other (See Comments) and Swelling    Other reaction(s): Other (See Comments) "respiratory distress and wheezing" "respiratory distress and wheezing"  . Tape Other (See Comments)    Other reaction(s): Other (See Comments) Pulls skin off Pt states that it pulls her skin  off.  Pt states that it pulls her skin off.   . Wellbutrin [Bupropion] Other (See Comments)    Other reaction(s): Other (See Comments) Mental changes Reaction:  Mental changes   . Augmentin [Amoxicillin-Pot Clavulanate] Rash and Other (See Comments)    Has patient had a PCN reaction causing immediate rash, facial/tongue/throat swelling, SOB or lightheadedness with hypotension: No Has patient had a PCN reaction causing severe rash involving mucus membranes or skin necrosis: No Has patient had a PCN reaction that required hospitalization No Has patient had a PCN reaction occurring within the last 10 years: No If all of the above answers are "NO", then may proceed with Cephalosporin use.  . Codeine Rash  . Cortisone Rash  . Diphenhydramine Rash  . Hydrocodone-Acetaminophen Rash    dizzy  . Vicodin [Hydrocodone-Acetaminophen] Rash and Other (See Comments)    Reaction:  Dizziness      VITALS:  Blood pressure 112/61, pulse 99, temperature 97.9 F (36.6 C), temperature source Oral, resp. rate 18, height 5\' 4"  (1.626 m), weight 71.7 kg (158 lb), SpO2 94 %.  PHYSICAL EXAMINATION:  GENERAL:  62 y.o.-year-old patient lying in the bed with no acute distress.  EYES: Pupils equal, round, reactive to light and accommodation. No scleral icterus. Extraocular muscles intact.  HEENT: Head atraumatic, normocephalic. Oropharynx and nasopharynx clear.  NECK:  Supple, no jugular venous distention. No thyroid enlargement, no tenderness.  LUNGS: Normal breath sounds bilaterally, no wheezing, rales,rhonchi or crepitation. No use of accessory muscles of respiration.  CARDIOVASCULAR: S1, S2 normal. No murmurs, rubs, or gallops.  ABDOMEN: Soft, nontender, nondistended. Bowel sounds present.  No organomegaly or mass.  EXTREMITIES: No pedal edema, cyanosis, or clubbing.  NEUROLOGIC: Cranial nerves II through XII are intact. Muscle strength 5/5 in all extremities. Sensation intact. Gait not checked.  PSYCHIATRIC:  The patient is alert and oriented x 3.  SKIN: No obvious rash, lesion, or ulcer.   Physical Exam LABORATORY PANEL:   CBC Recent Labs  Lab 04/24/17 0347  WBC 9.1  HGB 8.6*  HCT 25.9*  PLT 111*   ------------------------------------------------------------------------------------------------------------------  Chemistries  Recent Labs  Lab 04/11/2017 2330  04/23/17 0704  NA 131*   < > 128*  K 3.9   < > 3.9  CL 91*   < > 97*  CO2 29   < > 23  GLUCOSE 115*   < > 137*  BUN 52*   < > 35*  CREATININE 2.03*   < > 1.27*  CALCIUM 8.1*   < > 7.7*  AST 79*  --   --   ALT 35  --   --   ALKPHOS 252*  --   --   BILITOT 0.7  --   --    < > = values in this interval not displayed.   ------------------------------------------------------------------------------------------------------------------  Cardiac Enzymes Recent Labs  Lab 04/17/2017 0421 04/04/2017 1018  TROPONINI <0.03 <0.03   ------------------------------------------------------------------------------------------------------------------  RADIOLOGY:  Dg Chest 2 View  Result Date: 04/24/2017 CLINICAL DATA:  Hypoxia. Pt had total left hip arthroplasty 4 days ago. Hx - CHF, COPD, HTN, diabetes, PNA, cirrhosis, on 2L oxygen at home, current smoker 0.5 ppd x 48 yrs. EXAM: CHEST  2 VIEW COMPARISON:  04/11/2017 FINDINGS: There are new bilateral patchy airspace opacities there are most prominent in the mid to lower lungs. Stable scarring is noted in the right upper lobe. There is chronic prominence of the bronchovascular markings stable from prior exams. There is no pleural effusion.  No pneumothorax. Cardiac silhouette is normal in size. No mediastinal or hilar masses. IMPRESSION: 1. Patchy bilateral airspace lung opacities. This may reflect multifocal pneumonia or asymmetric pulmonary edema. Electronically Signed   By: Lajean Manes M.D.   On: 04/24/2017 09:42    ASSESSMENT AND PLAN:  62 yo female w/ hx of DM, HTN, Hyperlipidemia,  hx of Cirrhosis, Chronic Bronchitis, Anxiety, Anemia, GERD, Depression, PTSD, Chronic Diastolic CHF, hx of Panic Attack who presented to the hospital due to a fall and noted to have a Left Hip Fracture.   1. S/p Fall and Left Hip Fracture s/p left hip hemiarthroplasty by orthopedic surgery   2.  Acute on chronic diastolic CHF Acute hypoxia noted this morning April 24, 2017 requiring tarsal facemask, chest x-ray noted for edema versus pneumonia Start IV Lasix twice daily, continue Aldactone, metoprolol, strict I&O monitoring, daily weights, discontinue IV fluids  3.  Acute HCAP Noted on chest x-ray April 24, 2017 with acute hypoxia clinically Start cefepime, vancomycin for 5-day course and follow-up on cultures per pneumonia protocol  4.  Acute hypoxic respiratory failure Secondary to above Aggressive pulmonary toilet with bronchodilator therapy, mucolytic agents, respiratory therapy to evaluate/treat, wean O2 as tolerated  5.  Acute on COPD exacerbation IV Solu-Medrol with tapering as tolerated, inhaled corticosteroids, supplemental oxygen as needed  6. Hx of gout Stable  Continue allopurinol  7. Hypertension, chronic Stable on current regiment  8.  Chronic diabetes mellitus type 2 Stable on current regiment  9.  Chronic hypothyroidism, unspecified Stable on Synthroid.  10. History of PTSD Stable on Lamictal  11 Chronic GERD Stable  Continue Protonix  12. Depression Stable on Zoloft.  All the records are reviewed and case discussed with Care Management/Social Workerr. Management plans discussed with the patient, family and they are in agreement.  CODE STATUS: full  TOTAL TIME TAKING CARE OF THIS PATIENT: 40 minutes.     POSSIBLE D/C IN 1-3 DAYS, DEPENDING ON CLINICAL CONDITION.   Avel Peace Eula Jaster M.D on 04/24/2017   Between 7am to 6pm - Pager - [phone]  After 6pm go to [email or web address] - password [email or web address] [facility]  Office   [phone]  CC: Primary care physician; Casilda Carls, MD  Note: This dictation was prepared with Dragon dictation along with smaller phrase technology. Any transcriptional errors that result from this process are unintentional.

## 2017-04-24 NOTE — Progress Notes (Signed)
Pharmacy Antibiotic Note  Samantha Mcmillan is a 62 y.o. female admitted on 04/03/2017 with pneumonia.  Pharmacy has been consulted for vancomycin dosing.  Plan: Vancomycin 1000mg  IV every 24 hours.  Goal trough 15-20 mcg/mL.  Height: 5\' 4"  (162.6 cm) Weight: 158 lb (71.7 kg) IBW/kg (Calculated) : 54.7  Temp (24hrs), Avg:98.6 F (37 C), Min:97.9 F (36.6 C), Max:99.6 F (37.6 C)  Recent Labs  Lab 04/19/2017 2256 04/09/2017 2330 03/28/2017 0421 04/22/17 0340 04/23/17 0704 04/24/17 0347  WBC 5.7  --  5.0 11.5* 10.0 9.1  CREATININE  --  2.03* 1.82* 1.41* 1.27*  --     Estimated Creatinine Clearance: 44 mL/min (A) (by C-G formula based on SCr of 1.27 mg/dL (H)).    Allergies  Allergen Reactions  . Iodine Shortness Of Breath  . Latex Anaphylaxis  . Other Rash, Other (See Comments), Shortness Of Breath, Anaphylaxis and Swelling    Pt states that she is allergic to Darvocet.   . Oxycodone Shortness Of Breath, Rash and Swelling    Other reaction(s): Other (See Comments) wheezing  . Oxycodone-Acetaminophen Other (See Comments), Rash, Swelling and Shortness Of Breath    wheezing wheezing  . Shellfish-Derived Products Anaphylaxis, Rash, Shortness Of Breath and Swelling    Throat closes.  . Amitriptyline Other (See Comments)    Other reaction(s): Other (See Comments) Mental changes Other reaction(s): Other (See Comments) Altered mental status, agitation Reaction:  Mental changes   . Fish-Derived Products Other (See Comments) and Swelling    Other reaction(s): Other (See Comments) "respiratory distress and wheezing" "respiratory distress and wheezing"  . Tape Other (See Comments)    Other reaction(s): Other (See Comments) Pulls skin off Pt states that it pulls her skin off.  Pt states that it pulls her skin off.   . Wellbutrin [Bupropion] Other (See Comments)    Other reaction(s): Other (See Comments) Mental changes Reaction:  Mental changes   . Augmentin [Amoxicillin-Pot  Clavulanate] Rash and Other (See Comments)    Has patient had a PCN reaction causing immediate rash, facial/tongue/throat swelling, SOB or lightheadedness with hypotension: No Has patient had a PCN reaction causing severe rash involving mucus membranes or skin necrosis: No Has patient had a PCN reaction that required hospitalization No Has patient had a PCN reaction occurring within the last 10 years: No If all of the above answers are "NO", then may proceed with Cephalosporin use.  . Codeine Rash  . Cortisone Rash  . Diphenhydramine Rash  . Hydrocodone-Acetaminophen Rash    dizzy  . Vicodin [Hydrocodone-Acetaminophen] Rash and Other (See Comments)    Reaction:  Dizziness      Antimicrobials this admission: Anti-infectives (From admission, onward)   Start     Dose/Rate Route Frequency Ordered Stop   04/24/17 1700  vancomycin (VANCOCIN) IVPB 1000 mg/200 mL premix     1,000 mg 200 mL/hr over 60 Minutes Intravenous Every 24 hours 04/24/17 1120     04/24/17 1200  ceFEPIme (MAXIPIME) 1 g in sodium chloride 0.9 % 100 mL IVPB     1 g 200 mL/hr over 30 Minutes Intravenous Every 8 hours 04/24/17 1031 05/02/17 1159   04/24/17 1130  vancomycin (VANCOCIN) IVPB 1000 mg/200 mL premix     1,000 mg 200 mL/hr over 60 Minutes Intravenous  Once 04/24/17 1118     04/04/2017 2030  clindamycin (CLEOCIN) IVPB 600 mg     600 mg 100 mL/hr over 30 Minutes Intravenous Every 6 hours 04/05/2017 2025 04/22/17 0257  04/05/2017 1430  clindamycin (CLEOCIN) IVPB 600 mg     600 mg 100 mL/hr over 30 Minutes Intravenous  Once 04/09/2017 1429 04/20/2017 1533   04/16/2017 1428  clindamycin (CLEOCIN) 600 MG/50ML IVPB    Comments:  Phineas Real   : cabinet override      04/08/2017 1428 04/13/2017 1503       Microbiology results: Recent Results (from the past 240 hour(s))  Surgical PCR screen     Status: None   Collection Time: 04/13/2017 12:09 AM  Result Value Ref Range Status   MRSA, PCR NEGATIVE NEGATIVE Final    Staphylococcus aureus NEGATIVE NEGATIVE Final    Comment: (NOTE) The Xpert SA Assay (FDA approved for NASAL specimens in patients 61 years of age and older), is one component of a comprehensive surveillance program. It is not intended to diagnose infection nor to guide or monitor treatment. Performed at [facility], [address] [place], [place] [address]      Thank you for allowing pharmacy to be a part of this patient's care.  Donna Christen Devantae Babe 04/24/2017 11:21 AM

## 2017-04-24 NOTE — Progress Notes (Signed)
SLP Cancellation Note  Patient Details Name: Samantha Mcmillan MRN: [id number] DOB: Jan 23, 1956   Cancelled treatment:       Reason Eval/Treat Not Completed: Fatigue/lethargy limiting ability to participate(chart reviewed; consulted NSG then met w/ pt/family). Pt appears at increased risk for aspiration. Recommend strict NPO status including meds d/t pt's poor cough effort and reduced alertness(eyes closed frequently). Pt's breathing exhalation effort appeared min effortful w/ exertion of attempting to talk w/ SLP. Pt's 82 % on 4 L nasal cannula. Pt placed on venturi mask. Pt remains on O2, sats now 95%. NSG reported this similar event occurred yesterday PM.  MD is addressing pt's pulmonary status. ST services can be consulted next 1-2 days if pt's respiratory status and alertness improves for safe BSE/po intake. MD/NSG updated, agreed.    Orinda Kenner, MS, CCC-SLP Samantha Mcmillan 04/24/2017, 10:28 AM

## 2017-04-24 NOTE — Progress Notes (Signed)
Pt 82 % on 4 L nasal cannula. Respiratory notified. Pt placed on venturi mask. Pts O2 now 95 %. MD Salary notified.  Orders received for 2 view chest xray. Order placed.

## 2017-04-25 ENCOUNTER — Encounter: Payer: Self-pay | Admitting: Radiology

## 2017-04-25 ENCOUNTER — Inpatient Hospital Stay (HOSPITAL_COMMUNITY)
Admit: 2017-04-25 | Discharge: 2017-04-25 | Disposition: A | Discharge status: Home / Self Care | Payer: Medicare Other | Attending: Family Medicine | Admitting: Family Medicine

## 2017-04-25 ENCOUNTER — Inpatient Hospital Stay: Payer: Medicare Other

## 2017-04-25 DIAGNOSIS — J810 Acute pulmonary edema: Secondary | ICD-10-CM

## 2017-04-25 DIAGNOSIS — I351 Nonrheumatic aortic (valve) insufficiency: Secondary | ICD-10-CM

## 2017-04-25 DIAGNOSIS — J9601 Acute respiratory failure with hypoxia: Secondary | ICD-10-CM

## 2017-04-25 DIAGNOSIS — J441 Chronic obstructive pulmonary disease with (acute) exacerbation: Secondary | ICD-10-CM

## 2017-04-25 LAB — ECHOCARDIOGRAM COMPLETE
Height: 64 in
WEIGHTICAEL: 2656 [oz_av]

## 2017-04-25 LAB — BLOOD GAS, ARTERIAL
Acid-base deficit: 1.2 mmol/L (ref 0.0–2.0)
BICARBONATE: 23.6 mmol/L (ref 20.0–28.0)
FIO2: 0.4
O2 SAT: 85.3 %
PCO2 ART: 39 mmHg (ref 32.0–48.0)
PH ART: 7.39 (ref 7.350–7.450)
Patient temperature: 37
pO2, Arterial: 51 mmHg — ABNORMAL LOW (ref 83.0–108.0)

## 2017-04-25 LAB — GLUCOSE, CAPILLARY
GLUCOSE-CAPILLARY: 176 mg/dL — AB (ref 65–99)
GLUCOSE-CAPILLARY: 175 mg/dL — AB (ref 65–99)
GLUCOSE-CAPILLARY: 169 mg/dL — AB (ref 65–99)
GLUCOSE-CAPILLARY: 186 mg/dL — AB (ref 65–99)
Glucose-Capillary: 187 mg/dL — ABNORMAL HIGH (ref 65–99)

## 2017-04-25 LAB — BASIC METABOLIC PANEL
ANION GAP: 12 (ref 5–15)
BUN: 43 mg/dL — ABNORMAL HIGH (ref 6–20)
CHLORIDE: 100 mmol/L — AB (ref 101–111)
CO2: 22 mmol/L (ref 22–32)
CREATININE: 1.34 mg/dL — AB (ref 0.44–1.00)
Calcium: 8.5 mg/dL — ABNORMAL LOW (ref 8.9–10.3)
GFR calc non Af Amer: 41 mL/min — ABNORMAL LOW (ref >60)
GFR, EST AFRICAN AMERICAN: 48 mL/min — AB (ref >60)
Glucose, Bld: 201 mg/dL — ABNORMAL HIGH (ref 65–99)
Potassium: 4 mmol/L (ref 3.5–5.1)
SODIUM: 134 mmol/L — AB (ref 135–145)

## 2017-04-25 LAB — BRAIN NATRIURETIC PEPTIDE: B NATRIURETIC PEPTIDE 5: 661 pg/mL — AB (ref 0.0–100.0)

## 2017-04-25 LAB — PREALBUMIN

## 2017-04-25 LAB — HIV ANTIBODY (ROUTINE TESTING W REFLEX): HIV Screen 4th Generation wRfx: NONREACTIVE

## 2017-04-25 MED ORDER — SODIUM CHLORIDE 0.9 % IV SOLN
INTRAVENOUS | Status: DC
  Administered 2017-04-25 – 2017-04-30 (×5): via INTRAVENOUS

## 2017-04-25 MED ORDER — IOPAMIDOL (ISOVUE-370) INJECTION 76%
75.0000 mL | Freq: Once | INTRAVENOUS | Status: AC | PRN
  Administered 2017-04-25: 75 mL via INTRAVENOUS

## 2017-04-25 MED ORDER — METOPROLOL TARTRATE 5 MG/5ML IV SOLN
2.5000 mg | Freq: Four times a day (QID) | INTRAVENOUS | Status: DC
  Administered 2017-04-25 – 2017-04-26 (×3): 2.5 mg via INTRAVENOUS
  Filled 2017-04-25 (×4): qty 5

## 2017-04-25 MED ORDER — ORAL CARE MOUTH RINSE
15.0000 mL | Freq: Two times a day (BID) | OROMUCOSAL | Status: DC
  Administered 2017-04-25 – 2017-04-30 (×5): 15 mL via OROMUCOSAL

## 2017-04-25 MED ORDER — SODIUM CHLORIDE 0.9 % IV SOLN
2.0000 g | Freq: Two times a day (BID) | INTRAVENOUS | Status: AC
  Administered 2017-04-25 – 2017-05-01 (×13): 2 g via INTRAVENOUS
  Filled 2017-04-25 (×14): qty 2

## 2017-04-25 MED ORDER — FUROSEMIDE 10 MG/ML IJ SOLN
40.0000 mg | Freq: Two times a day (BID) | INTRAMUSCULAR | Status: DC
  Administered 2017-04-25 – 2017-04-26 (×3): 40 mg via INTRAVENOUS
  Filled 2017-04-25 (×3): qty 4

## 2017-04-25 MED ORDER — METOPROLOL TARTRATE 5 MG/5ML IV SOLN: 2.5000 mg | Freq: Four times a day (QID) | INTRAVENOUS | Status: DC

## 2017-04-25 MED ORDER — NICOTINE 14 MG/24HR TD PT24
14.0000 mg | MEDICATED_PATCH | Freq: Every day | TRANSDERMAL | Status: DC
  Administered 2017-04-25 – 2017-05-06 (×12): 14 mg via TRANSDERMAL
  Filled 2017-04-25 (×12): qty 1

## 2017-04-25 MED ORDER — CEFEPIME HCL 1 G IJ SOLR
1.0000 g | Freq: Once | INTRAMUSCULAR | Status: AC
  Administered 2017-04-25: 1 g via INTRAVENOUS
  Filled 2017-04-25: qty 1

## 2017-04-25 NOTE — Progress Notes (Signed)
[facility] [place] NAME: Samantha Mcmillan    MR#:  [id number]  DATE OF BIRTH:  1955-11-21  SUBJECTIVE:  CHIEF COMPLAINT:   Chief Complaint  Patient presents with  . Hip Pain  . Fall  Patient states that she feels slightly better, no events overnight per nursing staff-continue hypoxia requiring Ventimask, CT chest noted for extensive bilateral groundglass opacities with broad differential, will ask pulmonology to see, check blood gas, increase IV Lasix, check echocardiogram, palliative care consult given poor long-term prognosis  REVIEW OF SYSTEMS:  CONSTITUTIONAL: No fever, fatigue or weakness.  EYES: No blurred or double vision.  EARS, NOSE, AND THROAT: No tinnitus or ear pain.  RESPIRATORY: No cough, shortness of breath, wheezing or hemoptysis.  CARDIOVASCULAR: No chest pain, orthopnea, edema.  GASTROINTESTINAL: No nausea, vomiting, diarrhea or abdominal pain.  GENITOURINARY: No dysuria, hematuria.  ENDOCRINE: No polyuria, nocturia,  HEMATOLOGY: No anemia, easy bruising or bleeding SKIN: No rash or lesion. MUSCULOSKELETAL: No joint pain or arthritis.   NEUROLOGIC: No tingling, numbness, weakness.  PSYCHIATRY: No anxiety or depression.   ROS  DRUG ALLERGIES:   Allergies  Allergen Reactions  . Iodine Shortness Of Breath  . Latex Anaphylaxis  . Other Rash, Other (See Comments), Shortness Of Breath, Anaphylaxis and Swelling    Pt states that she is allergic to Darvocet.   . Oxycodone Shortness Of Breath, Rash and Swelling    Other reaction(s): Other (See Comments) wheezing  . Oxycodone-Acetaminophen Other (See Comments), Rash, Swelling and Shortness Of Breath    wheezing wheezing  . Shellfish-Derived Products Anaphylaxis, Rash, Shortness Of Breath and Swelling    Throat closes.  . Amitriptyline Other (See Comments)    Other reaction(s): Other (See Comments) Mental changes Other reaction(s): Other (See Comments) Altered mental  status, agitation Reaction:  Mental changes   . Fish-Derived Products Other (See Comments) and Swelling    Other reaction(s): Other (See Comments) "respiratory distress and wheezing" "respiratory distress and wheezing"  . Tape Other (See Comments)    Other reaction(s): Other (See Comments) Pulls skin off Pt states that it pulls her skin off.  Pt states that it pulls her skin off.   . Wellbutrin [Bupropion] Other (See Comments)    Other reaction(s): Other (See Comments) Mental changes Reaction:  Mental changes   . Augmentin [Amoxicillin-Pot Clavulanate] Rash and Other (See Comments)    Has patient had a PCN reaction causing immediate rash, facial/tongue/throat swelling, SOB or lightheadedness with hypotension: No Has patient had a PCN reaction causing severe rash involving mucus membranes or skin necrosis: No Has patient had a PCN reaction that required hospitalization No Has patient had a PCN reaction occurring within the last 10 years: No If all of the above answers are "NO", then may proceed with Cephalosporin use.  . Codeine Rash  . Cortisone Rash  . Diphenhydramine Rash  . Hydrocodone-Acetaminophen Rash    dizzy  . Vicodin [Hydrocodone-Acetaminophen] Rash and Other (See Comments)    Reaction:  Dizziness      VITALS:  Blood pressure 122/73, pulse (!) 105, temperature 97.8 F (36.6 C), temperature source Axillary, resp. rate 20, height 5\' 4"  (1.626 m), weight 75.3 kg (166 lb), SpO2 90 %.  PHYSICAL EXAMINATION:  GENERAL:  62 y.o.-year-old patient lying in the bed with no acute distress.  Frail appearing EYES: Pupils equal, round, reactive to light and accommodation. No scleral icterus. Extraocular muscles intact.  HEENT: Head atraumatic, normocephalic. Oropharynx and nasopharynx clear.  NECK:  Supple, no jugular venous distention. No thyroid enlargement, no tenderness.  LUNGS: Rhonchi on the left much greater than right, diminished breath sounds throughout. No use of accessory  muscles of respiration.  Shallow breathing CARDIOVASCULAR: S1, S2 normal. No murmurs, rubs, or gallops.  ABDOMEN: Soft, nontender, nondistended. Bowel sounds present. No organomegaly or mass.  EXTREMITIES: No pedal edema, cyanosis, or clubbing.  Diffuse extensive muscular wasting NEUROLOGIC: Cranial nerves II through XII are intact. MAES. Gait not checked.  PSYCHIATRIC: The patient is alert and oriented x 3.  SKIN: No obvious rash, lesion, or ulcer.   Physical Exam LABORATORY PANEL:   CBC Recent Labs  Lab 04/24/17 0347  WBC 9.1  HGB 8.6*  HCT 25.9*  PLT 111*   ------------------------------------------------------------------------------------------------------------------  Chemistries  Recent Labs  Lab 03/26/2017 2330  04/25/17 0356  NA 131*   < > 134*  K 3.9   < > 4.0  CL 91*   < > 100*  CO2 29   < > 22  GLUCOSE 115*   < > 201*  BUN 52*   < > 43*  CREATININE 2.03*   < > 1.34*  CALCIUM 8.1*   < > 8.5*  AST 79*  --   --   ALT 35  --   --   ALKPHOS 252*  --   --   BILITOT 0.7  --   --    < > = values in this interval not displayed.   ------------------------------------------------------------------------------------------------------------------  Cardiac Enzymes Recent Labs  Lab 04/18/2017 0421 04/19/2017 1018  TROPONINI <0.03 <0.03   ------------------------------------------------------------------------------------------------------------------  RADIOLOGY:  Dg Chest 2 View  Result Date: 04/24/2017 CLINICAL DATA:  Hypoxia. Pt had total left hip arthroplasty 4 days ago. Hx - CHF, COPD, HTN, diabetes, PNA, cirrhosis, on 2L oxygen at home, current smoker 0.5 ppd x 48 yrs. EXAM: CHEST  2 VIEW COMPARISON:  04/19/2017 FINDINGS: There are new bilateral patchy airspace opacities there are most prominent in the mid to lower lungs. Stable scarring is noted in the right upper lobe. There is chronic prominence of the bronchovascular markings stable from prior exams. There is no  pleural effusion.  No pneumothorax. Cardiac silhouette is normal in size. No mediastinal or hilar masses. IMPRESSION: 1. Patchy bilateral airspace lung opacities. This may reflect multifocal pneumonia or asymmetric pulmonary edema. Electronically Signed   By: Lajean Manes M.D.   On: 04/24/2017 09:42   Ct Angio Chest Pe W Or Wo Contrast  Result Date: 04/25/2017 CLINICAL DATA:  Hypoxia. Abnormal chest radiograph. Evaluate for pulmonary embolism. EXAM: CT ANGIOGRAPHY CHEST WITH CONTRAST TECHNIQUE: Multidetector CT imaging of the chest was performed using the standard protocol during bolus administration of intravenous contrast. Multiplanar CT image reconstructions and MIPs were obtained to evaluate the vascular anatomy. CONTRAST:  4mL ISOVUE-370 IOPAMIDOL (ISOVUE-370) INJECTION 76% COMPARISON:  Chest radiograph - 04/24/2017; 04/15/2017; cervical spine CT - 04/19/2017; CT abdomen pelvis - 03/07/2016 FINDINGS: Vascular Findings: There is adequate opacification of the pulmonary arterial system with the main pulmonary artery measuring 448 Hounsfield units. There are no discrete filling defects within the pulmonary arterial tree to suggest pulmonary embolism. Normal caliber the main pulmonary scratch the borderline enlarged caliber the main pulmonary artery measuring 3.3 cm in diameter (image 38, series 4). Borderline cardiomegaly.  Coronary artery calcifications. Atherosclerotic plaque within the thoracic aorta. Conventional configuration of the aortic arch. The branch vessels of the aortic arch appear widely patent throughout their imaged course. Review of the MIP images  confirms the above findings. ---------------------------------------------------------------------------------- Nonvascular Findings: Mediastinum/Lymph Nodes: Mediastinal and hilar lymphadenopathy with index prevascular lymph node measuring 1.5 cm in greatest short axis diameter (image 32, series 4), index right-sided paratracheal lymph node  measuring 1.5 cm (image 33), index subcarinal lymph node measuring 1.9 cm (image 43), index right hilar lymph node measuring 1.3 cm (image 46) and index left hilar lymph node measuring 1.2 cm (image 42). No axillary lymphadenopathy. Lungs/Pleura: Extensive bilateral mid lung predominant ground-glass opacities with associated perihilar air bronchograms. Biapical centrilobular emphysematous change. No pleural effusion or pneumothorax. The central pulmonary airways appear widely patent. Upper abdomen: Early arterial phase evaluation of the upper abdomen demonstrates nodular hepatic contour compatible with hepatic cirrhosis. This finding is associated with splenomegaly (with the spleen measuring 14.5 cm in length and small amount of perihepatic ascites. Apparent thickening of the gastric fundus, potentially accentuated due to underdistention. There is diffuse thickening of the bilateral adrenal glands, incompletely evaluated. Musculoskeletal: Moderate (approximate 50%) compression deformity involving the superior endplate of C7, similar to neck CT performed 03/26/2017; moderate (approximately 40%) compression deformity involving the superior endplate of W10, similar to the abdominal CT performed 02/2016. Post paraspinal fusion of the lower cervical spine, incompletely evaluated. IMPRESSION: 1. Extensive bilateral ground-glass alveolar opacities with associated mediastinal and hilar lymphadenopathy - differential considerations are broad though favor multifocal infection (including atypical etiologies), with additional considerations including alveolar pulmonary edema, drug toxicity, inhalational injury and (less likely) pulmonary hemorrhage. 2. No evidence of pulmonary embolism. 3. Stigmata of cirrhosis and portal venous hypertension including splenomegaly and small amount of perihepatic ascites within the incidentally imaged upper abdomen, similar to dedicated abdominal CT performed 02/2016. 4. Aortic Atherosclerosis  (ICD10-I70.0) and Emphysema (ICD10-J43.9). Electronically Signed   By: Sandi Mariscal M.D.   On: 04/25/2017 10:01    ASSESSMENT AND PLAN:  62 yo female w/ hx of DM, HTN, Hyperlipidemia, hx of Cirrhosis, Chronic Bronchitis, Anxiety, Anemia, GERD, Depression, PTSD, Chronic Diastolic CHF, hx of Panic Attack who presented to the hospital due to a fall and noted to have a Left Hip Fracture.   1.  Acute hypoxic respiratory failure Stable yet tenuous Secondary to bilateral HCAP, COPD, and CHF exacerbation  CT chest negative for PE, noted extensive bilateral groundglass opacities with broad differential, elevated pro-calcitonin level  Continue aggressive pulmonary toilet with BTs, mucolytic agents, RT following, oxygen with weaning as tolerated, consult pulmonology for expert opinion, check blood gas, BNP, echocardiogram, increase IV Lasix twice daily, strict I&O monitoring, daily weights, speech therapy to see given concern for possible aspiration  2.  Acute on chronic diastolic CHF Resolving Acute hypoxia noted this morning April 24, 2017 requiring tarsal facemask, chest x-ray noted for edema versus pneumonia, CT chest noted above Increase IV Lasix, continue Aldactone, metoprolol, strict I&O monitoring, daily weights, repeat echocardiogram, check BNP  Echocardiogram noted from 2016 with normal ejection fraction   3.  Acute bilateral HCAP Chest x-ray, CT chest noted above  Continue pneumonia protocol, empiric Cefepime/Vancomycin for 5-day course and follow-up on cultures   4.  Acute on COPD exacerbation Stable  Continue IV Solu-Medrol with tapering as tolerated, inhaled corticosteroids, supplemental oxygen prn, mucolytic agents, and all other plans as stated above  5.  S/p Fall and Left Hip Fracture s/p left hip hemiarthroplasty by orthopedic surgery  6. Hx of gout Stable  Continue allopurinol  7.Hypertension, chronic Stable on current regiment  8.  Chronic diabetes mellitus type  2 Stable on current regiment  9.  Chronic tobacco  smoking abuse/dependency Cessation counseling and nicotine patch daily  10.  Noncompliant with medical management Stable The importance of compliance was encouraged to the patient as well as family members  Consult palliative care given long-term poor prognosis   All the records are reviewed and case discussed with Care Management/Social Workerr. Management plans discussed with the patient, family and they are in agreement.  CODE STATUS: full  TOTAL TIME TAKING CARE OF THIS PATIENT: 45 minutes.     POSSIBLE D/C IN 2-4 DAYS, DEPENDING ON CLINICAL CONDITION.   Avel Peace Samantha Mcmillan M.D on 04/25/2017   Between 7am to 6pm - Pager - [phone]  After 6pm go to [email or web address] - password [email or web address] [facility]  Office  [phone]  CC: Primary care physician; Casilda Carls, MD  Note: This dictation was prepared with Dragon dictation along with smaller phrase technology. Any transcriptional errors that result from this process are unintentional.

## 2017-04-25 NOTE — Progress Notes (Signed)
Subjective: Difficulty breathing.   Just transferred to ICU for treatment.  No problems with hip  4 Days Post-Op Procedure(s) (LRB): ARTHROPLASTY BIPOLAR HIP (HEMIARTHROPLASTY) (Left)    Patient reports pain as mild.  Objective:   VITALS:   Vitals:   04/25/17 1345 04/25/17 1358  BP:  105/71  Pulse:  (!) 126  Resp:  13  Temp:  (!) 97.5 F (36.4 C)  SpO2: 91% 93%    Neurologically intact ABD soft Neurovascular intact Sensation intact distally Intact pulses distally Dorsiflexion/Plantar flexion intact Incision: scant drainage  LABS Recent Labs    04/23/17 0704 04/24/17 0347  HGB 8.5* 8.6*  HCT 24.8* 25.9*  WBC 10.0 9.1  PLT 106* 111*    Recent Labs    04/23/17 0704 04/25/17 0356  NA 128* 134*  K 3.9 4.0  BUN 35* 43*  CREATININE 1.27* 1.34*  GLUCOSE 137* 201*    No results for input(s): LABPT, INR in the last 72 hours.   Assessment/Plan: 4 Days Post-Op Procedure(s) (LRB): ARTHROPLASTY BIPOLAR HIP (HEMIARTHROPLASTY) (Left)   Up with therapy Discharge to SNF

## 2017-04-25 NOTE — Progress Notes (Signed)
Pts O2 between 74%-88% on Venrtri 40%. MD Salary notified. Orders received to transfer pt to ICU.

## 2017-04-25 NOTE — Progress Notes (Signed)
PHARMACY NOTE:  ANTIMICROBIAL RENAL DOSAGE ADJUSTMENT  Current antimicrobial regimen includes a mismatch between antimicrobial dosage and estimated renal function.  As per policy approved by the Pharmacy & Therapeutics and Medical Executive Committees, the antimicrobial dosage will be adjusted accordingly.  Current antimicrobial dosage:  Cefepime 1g IV every 8 hours.  Indication: HCAP  Renal Function:  Estimated Creatinine Clearance: 42.7 mL/min (A) (by C-G formula based on SCr of 1.34 mg/dL (H)). []      On intermittent HD, scheduled: []      On CRRT    Antimicrobial dosage has been changed to:  Cefepime 2g IV every 12 hours.  Additional comments:   Thank you for allowing pharmacy to be a part of this patient's care.  Olivia Canter, [facility] 04/25/2017 8:58 AM

## 2017-04-25 NOTE — Progress Notes (Signed)
*  PRELIMINARY RESULTS* Echocardiogram 2D Echocardiogram has been performed.  Samantha Mcmillan Samantha Mcmillan 04/25/2017, 12:39 PM

## 2017-04-25 NOTE — Progress Notes (Signed)
PT Cancellation Note  Patient Details Name: Samantha Mcmillan MRN: [id number] DOB: July 29, 1955   Cancelled Treatment:    Reason Eval/Treat Not Completed: Patient not medically ready   Chart reviewed.  Discussed with primary nurse and Dr. Jerelyn Charles who requested hold of session today.  Will continue as appropriate.   Chesley Noon 04/25/2017, 11:08 AM

## 2017-04-25 NOTE — Progress Notes (Signed)
Verbal order received By MD Salary for NS @ 40 ml/hr. Order placed.

## 2017-04-25 NOTE — Progress Notes (Signed)
Discussed case with Dr. Jerelyn Charles Patient admitted to ICU step down status for the hepatic  encephalopathy in the setting of COPD and diastolic heart failure  ABG shows PO2 of 51  Will provide high flow nasal cannula oxygen as needed and follow Resp status and mental status.

## 2017-04-25 NOTE — Plan of Care (Signed)
Pt transferred to ICU this shift from 1A due to hypoxia on venturi mask while on 1A. Pt has been remained on venturi mask at 50% while in the ICU and has been maintaining sats in the low to mid 90s. BP has been soft with a heart rate in the 110s-120s. Pt medicated once for pain related to left hip surgery. Has been lethargic but patient is able to be aroused and will follow commands. Multiple family members have been to the bedside. There is one son that family does NOT want to visit and they request that anyone who comes to visit must have the password in order to visit patient.

## 2017-04-25 NOTE — Progress Notes (Signed)
MD Salary notified that pt HR is in the 120's. Pt is NPO, scheduled does of metoprolol not given this am. Orders received for IV Metoprolol 2.5 mg Q6, while pt is NPO. Order placed.

## 2017-04-25 NOTE — Consult Note (Signed)
PULMONARY / CRITICAL CARE MEDICINE   Name: Samantha Mcmillan MRN: [id number] DOB: Aug 20, 1955    ADMISSION DATE:  04/14/2017   CONSULTATION DATE:  04/25/2017  REFERRING MD:  Dr. Jerelyn Charles  REASON: Acute respiratory failure and AMS  HISTORY OF PRESENT ILLNESS:   This is a 62 y/o female with a medical history as indicated below admitted with a traumatic fall with a left hip fracture. She is 4 days post left hip hemiarthroplasty. Developed acute respiratory failure secondary to HCAP, pulmonary edema and acute COPD exacerbation and was transferred to the ICU. Her chest x-ray shows diffuse pulmonary edema and multifocal pneumonia. Patient is confused and unable to provide any further history.  She denies pain chest pain nausea and vomiting.  PAST MEDICAL HISTORY :  She  has a past medical history of Acute on chronic diastolic CHF (congestive heart failure) ([place]) (05/16/2014), Anemia, Anxiety, Arthritis, Asthma, Chronic bronchitis (HCC), Chronic lower back pain, Chronic respiratory failure ([place]), Cirrhosis of liver ([place]), Cirrhosis of liver ([place]), Colon polyp, COPD (chronic obstructive pulmonary disease) ([place]), Depression, Family history of adverse reaction to anesthesia, GERD (gastroesophageal reflux disease), Headache, Heart murmur, High cholesterol, History of blood transfusion, Hypertension, IBS (irritable bowel syndrome), IDA (iron deficiency anemia) (08/14/2014), Iron deficiency anemia, OCD (obsessive compulsive disorder), On home oxygen therapy, Panic attack, Personal history of tobacco use, presenting hazards to health (06/21/2015), Pneumonia ("several times"), PTSD (post-traumatic stress disorder), Sleep apnea, and Type II diabetes mellitus ([place]).  PAST SURGICAL HISTORY: She  has a past surgical history that includes Cesarean section (1979); Cataract extraction w/ intraocular lens  implant, bilateral (Bilateral, 2005); Back surgery; Colonoscopy (2014); Upper gastrointestinal endoscopy; Posterior  fusion cervical spine (2015); Incision and drainage of wound (Right, 2009); Tubal ligation (1979); Dilation and curettage of uterus; Cardiac catheterization (N/A, 08/16/2014); Colonoscopy with propofol (N/A, 12/08/2016); Esophagogastroduodenoscopy (egd) with propofol (N/A, 12/08/2016); and Hip Arthroplasty (Left, 04/06/2017).  Allergies  Allergen Reactions  . Iodine Shortness Of Breath  . Latex Anaphylaxis  . Other Rash, Other (See Comments), Shortness Of Breath, Anaphylaxis and Swelling    Pt states that she is allergic to Darvocet.   . Oxycodone Shortness Of Breath, Rash and Swelling    Other reaction(s): Other (See Comments) wheezing  . Oxycodone-Acetaminophen Other (See Comments), Rash, Swelling and Shortness Of Breath    wheezing wheezing  . Shellfish-Derived Products Anaphylaxis, Rash, Shortness Of Breath and Swelling    Throat closes.  . Amitriptyline Other (See Comments)    Other reaction(s): Other (See Comments) Mental changes Other reaction(s): Other (See Comments) Altered mental status, agitation Reaction:  Mental changes   . Fish-Derived Products Other (See Comments) and Swelling    Other reaction(s): Other (See Comments) "respiratory distress and wheezing" "respiratory distress and wheezing"  . Tape Other (See Comments)    Other reaction(s): Other (See Comments) Pulls skin off Pt states that it pulls her skin off.  Pt states that it pulls her skin off.   . Wellbutrin [Bupropion] Other (See Comments)    Other reaction(s): Other (See Comments) Mental changes Reaction:  Mental changes   . Augmentin [Amoxicillin-Pot Clavulanate] Rash and Other (See Comments)    Has patient had a PCN reaction causing immediate rash, facial/tongue/throat swelling, SOB or lightheadedness with hypotension: No Has patient had a PCN reaction causing severe rash involving mucus membranes or skin necrosis: No Has patient had a PCN reaction that required hospitalization No Has patient had a PCN  reaction occurring within the last 10 years: No If all  of the above answers are "NO", then may proceed with Cephalosporin use.  . Codeine Rash  . Cortisone Rash  . Diphenhydramine Rash  . Hydrocodone-Acetaminophen Rash    dizzy  . Vicodin [Hydrocodone-Acetaminophen] Rash and Other (See Comments)    Reaction:  Dizziness      No current facility-administered medications on file prior to encounter.    Current Outpatient Medications on File Prior to Encounter  Medication Sig  . albuterol (PROAIR HFA) 108 (90 Base) MCG/ACT inhaler ProAir HFA 90 mcg/actuation aerosol inhaler  . allopurinol (ZYLOPRIM) 100 MG tablet Take 100 mg by mouth daily.   . ARIPiprazole (ABILIFY) 5 MG tablet Take 5 mg by mouth at bedtime.   Marland Kitchen atorvastatin (LIPITOR) 20 MG tablet Take 20 mg by mouth daily.  . budesonide-formoterol (SYMBICORT) 80-4.5 MCG/ACT inhaler Inhale 2 puffs into the lungs 2 (two) times daily.   . bumetanide (BUMEX) 1 MG tablet Take 2 mg by mouth 2 (two) times daily.   . Calcium Carbonate-Vitamin D (CALTRATE 600+D PO) Take 1 tablet by mouth daily.  . ferrous fumarate-iron polysaccharide complex (TANDEM) 162-115.2 MG CAPS capsule Take 1 capsule by mouth 2 (two) times daily after a meal.  . HYDROmorphone (DILAUDID) 2 MG tablet Take 1 tablet (2 mg total) by mouth every 6 (six) hours as needed for severe pain. (Patient taking differently: Take 2 mg by mouth every 8 (eight) hours as needed for severe pain. )  . insulin glargine (LANTUS) 100 UNIT/ML injection Inject 30 Units into the skin every morning.   Marland Kitchen ipratropium-albuterol (DUONEB) 0.5-2.5 (3) MG/3ML SOLN Take 3 mLs by nebulization every 4 (four) hours as needed.  . lamoTRIgine (LAMICTAL) 150 MG tablet Take 150 mg by mouth at bedtime.   Marland Kitchen levothyroxine (SYNTHROID, LEVOTHROID) 88 MCG tablet Take 88 mcg by mouth daily before breakfast.   . linaclotide (LINZESS) 290 MCG CAPS capsule Take 1 capsule (290 mcg total) daily before breakfast by mouth.  Marland Kitchen  lisinopril (PRINIVIL,ZESTRIL) 20 MG tablet Take 20 mg by mouth daily.  . magnesium oxide (MAG-OX) 400 MG tablet Take 1 tablet by mouth 2 (two) times daily.   . metolazone (ZAROXOLYN) 5 MG tablet Take 5 mg by mouth daily.  . metoprolol (LOPRESSOR) 50 MG tablet Take 50 mg by mouth 2 (two) times daily.  . Multiple Vitamin (MULTIVITAMIN WITH MINERALS) TABS tablet Take 1 tablet by mouth daily.  . pantoprazole (PROTONIX) 40 MG tablet Take 40 mg by mouth daily.  . sertraline (ZOLOFT) 100 MG tablet Take 200 mg by mouth daily.   Marland Kitchen spironolactone (ALDACTONE) 25 MG tablet Take 25 mg by mouth daily.  Marland Kitchen albuterol (PROVENTIL) (2.5 MG/3ML) 0.083% nebulizer solution Take 2.5 mg by nebulization every 6 (six) hours as needed for wheezing or shortness of breath.  . spironolactone (ALDACTONE) 50 MG tablet Take 1 tablet (50 mg total) by mouth daily.    FAMILY HISTORY:  Her indicated that her mother is deceased. She indicated that her father is deceased. She indicated that the status of her brother is unknown. She indicated that the status of her maternal aunt is unknown.   SOCIAL HISTORY: She  reports that she has been smoking cigarettes.  She has a 24.00 pack-year smoking history. she has never used smokeless tobacco. She reports that she does not drink alcohol or use drugs.  REVIEW OF SYSTEMS:   Unable to obtain as patient is confused  SUBJECTIVE:   VITAL SIGNS: BP 104/75 (BP Location: Right Arm)   Pulse Marland Kitchen)  132   Temp (!) 97.5 F (36.4 C) (Oral)   Resp (!) 30   Ht 5\' 4"  (1.626 m)   Wt 164 lb 0.4 oz (74.4 kg)   LMP  (LMP Unknown)   SpO2 94%   BMI 28.15 kg/m   HEMODYNAMICS:    VENTILATOR SETTINGS: FiO2 (%):  [5 %-50 %] 50 %  INTAKE / OUTPUT: I/O last 3 completed shifts: In: 560 [I.V.:160; IV Piggyback:400] Out: 2025 [Urine:2025]  PHYSICAL EXAMINATION: General: Appears acutely ill Neuro: Alert to person only, moves all extremities, follows some basic commands HEENT: PERRLA, trachea  midline, mild JVD Cardiovascular: Sinus tachycardia, S1-S2, no murmur regurg or gallop, +2 pulses, +1 edema bilaterally Lungs: Increased work of breathing, diffuse rhonchi in anterior and posterior lung fields, breath sounds significantly diminished bilaterally, bibasilar rales Abdomen: Nondistended, normal bowel sounds Musculoskeletal: Knitted range of motion in left lower extremity bilateral upper extremities with normal range of motion, no deformities Skin: Multiple bruises in bilateral upper extremities, skin is warm and dry without any rash  LABS:  BMET Recent Labs  Lab 04/22/17 0340 04/23/17 0704 04/25/17 0356  NA 131* 128* 134*  K 4.2 3.9 4.0  CL 96* 97* 100*  CO2 26 23 22   BUN 38* 35* 43*  CREATININE 1.41* 1.27* 1.34*  GLUCOSE 187* 137* 201*    Electrolytes Recent Labs  Lab 04/22/17 0340 04/23/17 0704 04/25/17 0356  CALCIUM 7.6* 7.7* 8.5*    CBC Recent Labs  Lab 04/22/17 0340 04/23/17 0704 04/24/17 0347  WBC 11.5* 10.0 9.1  HGB 8.9* 8.5* 8.6*  HCT 27.3* 24.8* 25.9*  PLT 143* 106* 111*    Coag's Recent Labs  Lab 04/03/2017 2330 04/01/2017 0421  INR 1.12 1.19    Sepsis Markers Recent Labs  Lab 04/24/17 1040  PROCALCITON 0.64    ABG Recent Labs  Lab 04/25/17 1130  PHART 7.39  PCO2ART 39  PO2ART 51*    Liver Enzymes Recent Labs  Lab 04/03/2017 2330  AST 79*  ALT 35  ALKPHOS 252*  BILITOT 0.7  ALBUMIN 2.5*    Cardiac Enzymes Recent Labs  Lab 04/01/2017 2256 03/29/2017 0421 04/14/2017 1018  TROPONINI <0.03 <0.03 <0.03    Glucose Recent Labs  Lab 04/24/17 1659 04/24/17 2101 04/25/17 0740 04/25/17 1156 04/25/17 1403 04/25/17 1558  GLUCAP 161* 181* 187* 176* 175* 169*    Imaging Ct Angio Chest Pe W Or Wo Contrast  Result Date: 04/25/2017 CLINICAL DATA:  Hypoxia. Abnormal chest radiograph. Evaluate for pulmonary embolism. EXAM: CT ANGIOGRAPHY CHEST WITH CONTRAST TECHNIQUE: Multidetector CT imaging of the chest was performed  using the standard protocol during bolus administration of intravenous contrast. Multiplanar CT image reconstructions and MIPs were obtained to evaluate the vascular anatomy. CONTRAST:  33mL ISOVUE-370 IOPAMIDOL (ISOVUE-370) INJECTION 76% COMPARISON:  Chest radiograph - 04/24/2017; 03/27/2017; cervical spine CT - 03/29/2017; CT abdomen pelvis - 03/07/2016 FINDINGS: Vascular Findings: There is adequate opacification of the pulmonary arterial system with the main pulmonary artery measuring 448 Hounsfield units. There are no discrete filling defects within the pulmonary arterial tree to suggest pulmonary embolism. Normal caliber the main pulmonary scratch the borderline enlarged caliber the main pulmonary artery measuring 3.3 cm in diameter (image 38, series 4). Borderline cardiomegaly.  Coronary artery calcifications. Atherosclerotic plaque within the thoracic aorta. Conventional configuration of the aortic arch. The branch vessels of the aortic arch appear widely patent throughout their imaged course. Review of the MIP images confirms the above findings. ---------------------------------------------------------------------------------- Nonvascular Findings: Mediastinum/Lymph Nodes: Mediastinal  and hilar lymphadenopathy with index prevascular lymph node measuring 1.5 cm in greatest short axis diameter (image 32, series 4), index right-sided paratracheal lymph node measuring 1.5 cm (image 33), index subcarinal lymph node measuring 1.9 cm (image 43), index right hilar lymph node measuring 1.3 cm (image 46) and index left hilar lymph node measuring 1.2 cm (image 42). No axillary lymphadenopathy. Lungs/Pleura: Extensive bilateral mid lung predominant ground-glass opacities with associated perihilar air bronchograms. Biapical centrilobular emphysematous change. No pleural effusion or pneumothorax. The central pulmonary airways appear widely patent. Upper abdomen: Early arterial phase evaluation of the upper abdomen  demonstrates nodular hepatic contour compatible with hepatic cirrhosis. This finding is associated with splenomegaly (with the spleen measuring 14.5 cm in length and small amount of perihepatic ascites. Apparent thickening of the gastric fundus, potentially accentuated due to underdistention. There is diffuse thickening of the bilateral adrenal glands, incompletely evaluated. Musculoskeletal: Moderate (approximate 50%) compression deformity involving the superior endplate of C7, similar to neck CT performed 04/05/2017; moderate (approximately 40%) compression deformity involving the superior endplate of X21, similar to the abdominal CT performed 02/2016. Post paraspinal fusion of the lower cervical spine, incompletely evaluated. IMPRESSION: 1. Extensive bilateral ground-glass alveolar opacities with associated mediastinal and hilar lymphadenopathy - differential considerations are broad though favor multifocal infection (including atypical etiologies), with additional considerations including alveolar pulmonary edema, drug toxicity, inhalational injury and (less likely) pulmonary hemorrhage. 2. No evidence of pulmonary embolism. 3. Stigmata of cirrhosis and portal venous hypertension including splenomegaly and small amount of perihepatic ascites within the incidentally imaged upper abdomen, similar to dedicated abdominal CT performed 02/2016. 4. Aortic Atherosclerosis (ICD10-I70.0) and Emphysema (ICD10-J43.9). Electronically Signed   By: Sandi Mariscal M.D.   On: 04/25/2017 10:01    STUDIES:  ECHO 04/25/2017 LV EF: 30% -   35%  CULTURES: Influenza swab  ANTIBIOTICS: Vancomycin 3/3> Cefepime 3/3>  SIGNIFICANT EVENTS: 02/28>L-Hip arthroplasty 03/03>ICU with respiratory failure  LINES/TUBES: PIVs  DISCUSSION: 62 year old female admitted with a left hip fracture from a traumatic fall at home now presenting with acute respiratory failure secondary to pneumonia and pulmonary edema from CHF  exacerbation  ASSESSMENT  Acute hypoxic respiratory failure Acute encephalopathy secondary to hypoxemia Acute on chronic CHF exacerbation Acute on chronic COPD exacerbation Acute pulmonary edema Acute renal failure-creatinine trending up from 1.27-1.34 Healthcare acquired pneumonia Left hip fracture status post left hemiarthroplasty Gout Type 2 diabetes Tobacco use disorder Hypertension  PLAN Hemodynamics per ICU protocol IV diuretics Continuous BiPAP and titrate to nasal cannula as tolerated As needed lorazepam for claustrophobia-give when on BiPAP only Last 2D echo reviewed Nebulized bronchodilators IV and inhaled steroids Blood cultures To bioethics as above Trend creatinine Monitor and replace electrolytes Continue all home medications Monitor fever curve Blood glucose monitoring with sliding scale insulin coverage GI and DVT prophylaxis Patient is a full code.  She is at high risk for intubation FAMILY  - Updates:   - Inter-disciplinary family meet or Palliative Care meeting due by:  day [place]. [facility] ANP-BC Pulmonary and Critical Care Medicine [facility] Pager [phone] or [phone]  NB: This document was prepared using Dragon voice recognition software and may include unintentional dictation errors.    04/25/2017, 7:46 PM

## 2017-04-26 ENCOUNTER — Inpatient Hospital Stay: Payer: Medicare Other

## 2017-04-26 DIAGNOSIS — J9601 Acute respiratory failure with hypoxia: Secondary | ICD-10-CM

## 2017-04-26 DIAGNOSIS — W19XXXA Unspecified fall, initial encounter: Secondary | ICD-10-CM

## 2017-04-26 DIAGNOSIS — Z7189 Other specified counseling: Secondary | ICD-10-CM

## 2017-04-26 DIAGNOSIS — J810 Acute pulmonary edema: Secondary | ICD-10-CM

## 2017-04-26 DIAGNOSIS — Z515 Encounter for palliative care: Secondary | ICD-10-CM

## 2017-04-26 DIAGNOSIS — S72002A Fracture of unspecified part of neck of left femur, initial encounter for closed fracture: Secondary | ICD-10-CM

## 2017-04-26 DIAGNOSIS — Z9911 Dependence on respirator [ventilator] status: Secondary | ICD-10-CM

## 2017-04-26 DIAGNOSIS — R0902 Hypoxemia: Secondary | ICD-10-CM

## 2017-04-26 LAB — CBC WITH DIFFERENTIAL/PLATELET
BASOS PCT: 0 %
Basophils Absolute: 0 10*3/uL (ref 0–0.1)
EOS ABS: 0 10*3/uL (ref 0–0.7)
EOS PCT: 0 %
HCT: 25.6 % — ABNORMAL LOW (ref 35.0–47.0)
Hemoglobin: 8.3 g/dL — ABNORMAL LOW (ref 12.0–16.0)
LYMPHS ABS: 0.2 10*3/uL — AB (ref 1.0–3.6)
Lymphocytes Relative: 2 %
MCH: 31.4 pg (ref 26.0–34.0)
MCHC: 32.4 g/dL (ref 32.0–36.0)
MCV: 96.9 fL (ref 80.0–100.0)
MONOS PCT: 3 %
Monocytes Absolute: 0.4 10*3/uL (ref 0.2–0.9)
Neutro Abs: 12 10*3/uL — ABNORMAL HIGH (ref 1.4–6.5)
Neutrophils Relative %: 95 %
PLATELETS: 119 10*3/uL — AB (ref 150–440)
RBC: 2.64 MIL/uL — ABNORMAL LOW (ref 3.80–5.20)
RDW: 16.1 % — ABNORMAL HIGH (ref 11.5–14.5)
WBC: 12.7 10*3/uL — AB (ref 3.6–11.0)

## 2017-04-26 LAB — GLUCOSE, CAPILLARY
GLUCOSE-CAPILLARY: 206 mg/dL — AB (ref 65–99)
Glucose-Capillary: 226 mg/dL — ABNORMAL HIGH (ref 65–99)
Glucose-Capillary: 197 mg/dL — ABNORMAL HIGH (ref 65–99)
Glucose-Capillary: 167 mg/dL — ABNORMAL HIGH (ref 65–99)

## 2017-04-26 LAB — LEGIONELLA PNEUMOPHILA SEROGP 1 UR AG: L. pneumophila Serogp 1 Ur Ag: NEGATIVE

## 2017-04-26 LAB — COMPREHENSIVE METABOLIC PANEL
ALK PHOS: 114 U/L (ref 38–126)
ALT: 42 U/L (ref 14–54)
AST: 80 U/L — ABNORMAL HIGH (ref 15–41)
Albumin: 1.9 g/dL — ABNORMAL LOW (ref 3.5–5.0)
Anion gap: 15 (ref 5–15)
BILIRUBIN TOTAL: 2 mg/dL — AB (ref 0.3–1.2)
BUN: 57 mg/dL — ABNORMAL HIGH (ref 6–20)
CALCIUM: 8.4 mg/dL — AB (ref 8.9–10.3)
CO2: 20 mmol/L — ABNORMAL LOW (ref 22–32)
Chloride: 102 mmol/L (ref 101–111)
Creatinine, Ser: 1.56 mg/dL — ABNORMAL HIGH (ref 0.44–1.00)
GFR, EST AFRICAN AMERICAN: 40 mL/min — AB (ref >60)
GFR, EST NON AFRICAN AMERICAN: 34 mL/min — AB (ref >60)
Glucose, Bld: 209 mg/dL — ABNORMAL HIGH (ref 65–99)
Potassium: 4.1 mmol/L (ref 3.5–5.1)
Sodium: 137 mmol/L (ref 135–145)
TOTAL PROTEIN: 5.2 g/dL — AB (ref 6.5–8.1)

## 2017-04-26 LAB — LACTIC ACID, PLASMA: LACTIC ACID, VENOUS: 1.2 mmol/L (ref 0.5–1.9)

## 2017-04-26 LAB — MAGNESIUM: MAGNESIUM: 2 mg/dL (ref 1.7–2.4)

## 2017-04-26 LAB — INFLUENZA PANEL BY PCR (TYPE A & B)
Influenza A By PCR: NEGATIVE
Influenza B By PCR: NEGATIVE

## 2017-04-26 LAB — PHOSPHORUS: PHOSPHORUS: 5.9 mg/dL — AB (ref 2.5–4.6)

## 2017-04-26 MED ORDER — BUDESONIDE 0.25 MG/2ML IN SUSP
0.2500 mg | Freq: Two times a day (BID) | RESPIRATORY_TRACT | Status: DC
  Administered 2017-04-26 – 2017-05-07 (×24): 0.25 mg via RESPIRATORY_TRACT
  Filled 2017-04-26 (×23): qty 2

## 2017-04-26 MED ORDER — PANTOPRAZOLE SODIUM 40 MG IV SOLR
40.0000 mg | INTRAVENOUS | Status: DC
  Administered 2017-04-26 – 2017-05-02 (×7): 40 mg via INTRAVENOUS
  Filled 2017-04-26 (×7): qty 40

## 2017-04-26 MED ORDER — METOPROLOL TARTRATE 5 MG/5ML IV SOLN
2.5000 mg | Freq: Four times a day (QID) | INTRAVENOUS | Status: DC | PRN
  Administered 2017-04-26 – 2017-04-28 (×3): 2.5 mg via INTRAVENOUS
  Administered 2017-04-28 – 2017-04-29 (×2): 5 mg via INTRAVENOUS
  Administered 2017-04-30: 2.5 mg via INTRAVENOUS
  Administered 2017-05-04 – 2017-05-05 (×3): 5 mg via INTRAVENOUS
  Filled 2017-04-26 (×9): qty 5

## 2017-04-26 MED ORDER — FUROSEMIDE 10 MG/ML IJ SOLN
20.0000 mg | Freq: Once | INTRAMUSCULAR | Status: AC
  Administered 2017-04-26: 20 mg via INTRAVENOUS
  Filled 2017-04-26: qty 2

## 2017-04-26 MED ORDER — IPRATROPIUM-ALBUTEROL 0.5-2.5 (3) MG/3ML IN SOLN
3.0000 mL | RESPIRATORY_TRACT | Status: DC
  Administered 2017-04-26 – 2017-04-28 (×17): 3 mL via RESPIRATORY_TRACT
  Filled 2017-04-26 (×17): qty 3

## 2017-04-26 MED ORDER — METHYLPREDNISOLONE SODIUM SUCC 40 MG IJ SOLR
40.0000 mg | Freq: Two times a day (BID) | INTRAMUSCULAR | Status: DC
  Administered 2017-04-26 – 2017-05-07 (×22): 40 mg via INTRAVENOUS
  Filled 2017-04-26 (×22): qty 1

## 2017-04-26 MED ORDER — LORAZEPAM 2 MG/ML IJ SOLN
0.5000 mg | Freq: Once | INTRAMUSCULAR | Status: AC
  Administered 2017-04-26: 0.5 mg via INTRAVENOUS
  Filled 2017-04-26: qty 1

## 2017-04-26 MED ORDER — INSULIN ASPART 100 UNIT/ML ~~LOC~~ SOLN
0.0000 [IU] | SUBCUTANEOUS | Status: DC
  Administered 2017-04-26 (×2): 2 [IU] via SUBCUTANEOUS
  Administered 2017-04-26 – 2017-04-27 (×3): 3 [IU] via SUBCUTANEOUS
  Administered 2017-04-27: 2 [IU] via SUBCUTANEOUS
  Administered 2017-04-27 – 2017-04-28 (×3): 3 [IU] via SUBCUTANEOUS
  Administered 2017-04-28 (×2): 7 [IU] via SUBCUTANEOUS
  Administered 2017-04-28 (×2): 3 [IU] via SUBCUTANEOUS
  Administered 2017-04-28 – 2017-04-29 (×2): 7 [IU] via SUBCUTANEOUS
  Administered 2017-04-29: 9 [IU] via SUBCUTANEOUS
  Administered 2017-04-29: 7 [IU] via SUBCUTANEOUS
  Administered 2017-04-30: 1 [IU] via SUBCUTANEOUS
  Administered 2017-05-01: 2 [IU] via SUBCUTANEOUS
  Administered 2017-05-01: 1 [IU] via SUBCUTANEOUS
  Administered 2017-05-01: 2 [IU] via SUBCUTANEOUS
  Administered 2017-05-01: 1 [IU] via SUBCUTANEOUS
  Administered 2017-05-01 – 2017-05-02 (×6): 2 [IU] via SUBCUTANEOUS
  Administered 2017-05-02: 1 [IU] via SUBCUTANEOUS
  Administered 2017-05-02 (×2): 2 [IU] via SUBCUTANEOUS
  Administered 2017-05-03 (×2): 3 [IU] via SUBCUTANEOUS
  Filled 2017-04-26 (×34): qty 1

## 2017-04-26 MED ORDER — LACTULOSE ENEMA
300.0000 mL | Freq: Every day | ORAL | Status: DC
  Administered 2017-04-26 – 2017-04-27 (×2): 300 mL via RECTAL
  Filled 2017-04-26 (×3): qty 300

## 2017-04-26 MED ORDER — BUDESONIDE 0.25 MG/2ML IN SUSP: 0.2500 mg | Freq: Two times a day (BID) | RESPIRATORY_TRACT | Status: DC

## 2017-04-26 NOTE — Progress Notes (Signed)
PT Cancellation Note  Patient Details Name: Samantha Mcmillan MRN: [id number] DOB: [birth year]   Cancelled Treatment:    Reason Eval/Treat Not Completed: Medical issues which prohibited therapy(Patient tranfserred to CCU due to decline in medical status.  Per policy, will require new orders due to change in status and transfer to higher level of care.  Please re-consult as medically appropriate.)   Briane Birden H. Owens Shark, PT, DPT, NCS 04/26/17, 10:36 AM [phone]

## 2017-04-26 NOTE — Progress Notes (Signed)
Inpatient Diabetes Program Recommendations  AACE/ADA: New Consensus Statement on Inpatient Glycemic Control (2015)  Target Ranges:  Prepandial:   less than 140 mg/dL      Peak postprandial:   less than 180 mg/dL (1-2 hours)      Critically ill patients:  140 - 180 mg/dL  Results for ONDREA, DOW (MRN [id number]) as of 04/26/2017 09:07  Ref. Range 04/25/2017 07:40 04/25/2017 11:56 04/25/2017 14:03 04/25/2017 15:58 04/25/2017 21:17 04/26/2017 07:33  Glucose-Capillary Latest Ref Range: 65 - 99 mg/dL 187 (H) 176 (H) 175 (H) 169 (H) 186 (H) 206 (H)    Review of Glycemic Control  Diabetes history: DM2 Outpatient Diabetes medications: Lantus 30 units daily Current orders for Inpatient glycemic control: Novolog 0-9 units TID with meals, Novolog 0-5 units QHS; Solumedrol 60 mg Q6H  Inpatient Diabetes Program Recommendations:  Correction (SSI): If patient will remain NPO, please consider changing frequency of CBGs and Novolog to Q4H.  Thanks, Barnie Alderman, RN, MSN, CDE Diabetes Coordinator Inpatient Diabetes Program [phone] (Team Pager from 8am to 5pm)

## 2017-04-26 NOTE — Progress Notes (Signed)
[facility] [place] NAME: Timmia Cogburn    MR#:  [id number]  DATE OF BIRTH:  02/01/1956  SUBJECTIVE:  CHIEF COMPLAINT:   Chief Complaint  Patient presents with  . Hip Pain  . Fall  No events overnight, continued hypotension, discussed with intensivist-we will plan for continued BiPAP with weaning as tolerated  REVIEW OF SYSTEMS:  CONSTITUTIONAL: No fever, fatigue or weakness.  EYES: No blurred or double vision.  EARS, NOSE, AND THROAT: No tinnitus or ear pain.  RESPIRATORY: No cough, shortness of breath, wheezing or hemoptysis.  CARDIOVASCULAR: No chest pain, orthopnea, edema.  GASTROINTESTINAL: No nausea, vomiting, diarrhea or abdominal pain.  GENITOURINARY: No dysuria, hematuria.  ENDOCRINE: No polyuria, nocturia,  HEMATOLOGY: No anemia, easy bruising or bleeding SKIN: No rash or lesion. MUSCULOSKELETAL: No joint pain or arthritis.   NEUROLOGIC: No tingling, numbness, weakness.  PSYCHIATRY: No anxiety or depression.   ROS  DRUG ALLERGIES:   Allergies  Allergen Reactions  . Iodine Shortness Of Breath  . Latex Anaphylaxis  . Other Rash, Other (See Comments), Shortness Of Breath, Anaphylaxis and Swelling    Pt states that she is allergic to Darvocet.   . Oxycodone Shortness Of Breath, Rash and Swelling    Other reaction(s): Other (See Comments) wheezing  . Oxycodone-Acetaminophen Other (See Comments), Rash, Swelling and Shortness Of Breath    wheezing wheezing  . Shellfish-Derived Products Anaphylaxis, Rash, Shortness Of Breath and Swelling    Throat closes.  . Amitriptyline Other (See Comments)    Other reaction(s): Other (See Comments) Mental changes Other reaction(s): Other (See Comments) Altered mental status, agitation Reaction:  Mental changes   . Fish-Derived Products Other (See Comments) and Swelling    Other reaction(s): Other (See Comments) "respiratory distress and wheezing" "respiratory distress and  wheezing"  . Tape Other (See Comments)    Other reaction(s): Other (See Comments) Pulls skin off Pt states that it pulls her skin off.  Pt states that it pulls her skin off.   . Wellbutrin [Bupropion] Other (See Comments)    Other reaction(s): Other (See Comments) Mental changes Reaction:  Mental changes   . Augmentin [Amoxicillin-Pot Clavulanate] Rash and Other (See Comments)    Has patient had a PCN reaction causing immediate rash, facial/tongue/throat swelling, SOB or lightheadedness with hypotension: No Has patient had a PCN reaction causing severe rash involving mucus membranes or skin necrosis: No Has patient had a PCN reaction that required hospitalization No Has patient had a PCN reaction occurring within the last 10 years: No If all of the above answers are "NO", then may proceed with Cephalosporin use.  . Codeine Rash  . Cortisone Rash  . Diphenhydramine Rash  . Hydrocodone-Acetaminophen Rash    dizzy  . Vicodin [Hydrocodone-Acetaminophen] Rash and Other (See Comments)    Reaction:  Dizziness      VITALS:  Blood pressure (!) 77/54, pulse 97, temperature (!) 96.9 F (36.1 C), temperature source Axillary, resp. rate (!) 23, height 5\' 4"  (1.626 m), weight 74.4 kg (164 lb 0.4 oz), SpO2 96 %.  PHYSICAL EXAMINATION:  GENERAL:  62 y.o.-year-old patient lying in the bed with no acute distress.  EYES: Pupils equal, round, reactive to light and accommodation. No scleral icterus. Extraocular muscles intact.  HEENT: Head atraumatic, normocephalic. Oropharynx and nasopharynx clear.  NECK:  Supple, no jugular venous distention. No thyroid enlargement, no tenderness.  LUNGS: Normal breath sounds bilaterally, no wheezing, rales,rhonchi or crepitation. No use of accessory  muscles of respiration.  CARDIOVASCULAR: S1, S2 normal. No murmurs, rubs, or gallops.  ABDOMEN: Soft, nontender, nondistended. Bowel sounds present. No organomegaly or mass.  EXTREMITIES: No pedal edema, cyanosis, or  clubbing.  NEUROLOGIC: Cranial nerves II through XII are intact. Muscle strength 5/5 in all extremities. Sensation intact. Gait not checked.  PSYCHIATRIC: The patient is alert and oriented x 3.  SKIN: No obvious rash, lesion, or ulcer.   Physical Exam LABORATORY PANEL:   CBC Recent Labs  Lab 04/26/17 0513  WBC 12.7*  HGB 8.3*  HCT 25.6*  PLT 119*   ------------------------------------------------------------------------------------------------------------------  Chemistries  Recent Labs  Lab 04/26/17 0513  NA 137  K 4.1  CL 102  CO2 20*  GLUCOSE 209*  BUN 57*  CREATININE 1.56*  CALCIUM 8.4*  MG 2.0  AST 80*  ALT 42  ALKPHOS 114  BILITOT 2.0*   ------------------------------------------------------------------------------------------------------------------  Cardiac Enzymes Recent Labs  Lab 04/01/2017 0421 04/14/2017 1018  TROPONINI <0.03 <0.03   ------------------------------------------------------------------------------------------------------------------  RADIOLOGY:  Ct Angio Chest Pe W Or Wo Contrast  Result Date: 04/25/2017 CLINICAL DATA:  Hypoxia. Abnormal chest radiograph. Evaluate for pulmonary embolism. EXAM: CT ANGIOGRAPHY CHEST WITH CONTRAST TECHNIQUE: Multidetector CT imaging of the chest was performed using the standard protocol during bolus administration of intravenous contrast. Multiplanar CT image reconstructions and MIPs were obtained to evaluate the vascular anatomy. CONTRAST:  40mL ISOVUE-370 IOPAMIDOL (ISOVUE-370) INJECTION 76% COMPARISON:  Chest radiograph - 04/24/2017; 04/07/2017; cervical spine CT - 04/03/2017; CT abdomen pelvis - 03/07/2016 FINDINGS: Vascular Findings: There is adequate opacification of the pulmonary arterial system with the main pulmonary artery measuring 448 Hounsfield units. There are no discrete filling defects within the pulmonary arterial tree to suggest pulmonary embolism. Normal caliber the main pulmonary scratch the  borderline enlarged caliber the main pulmonary artery measuring 3.3 cm in diameter (image 38, series 4). Borderline cardiomegaly.  Coronary artery calcifications. Atherosclerotic plaque within the thoracic aorta. Conventional configuration of the aortic arch. The branch vessels of the aortic arch appear widely patent throughout their imaged course. Review of the MIP images confirms the above findings. ---------------------------------------------------------------------------------- Nonvascular Findings: Mediastinum/Lymph Nodes: Mediastinal and hilar lymphadenopathy with index prevascular lymph node measuring 1.5 cm in greatest short axis diameter (image 32, series 4), index right-sided paratracheal lymph node measuring 1.5 cm (image 33), index subcarinal lymph node measuring 1.9 cm (image 43), index right hilar lymph node measuring 1.3 cm (image 46) and index left hilar lymph node measuring 1.2 cm (image 42). No axillary lymphadenopathy. Lungs/Pleura: Extensive bilateral mid lung predominant ground-glass opacities with associated perihilar air bronchograms. Biapical centrilobular emphysematous change. No pleural effusion or pneumothorax. The central pulmonary airways appear widely patent. Upper abdomen: Early arterial phase evaluation of the upper abdomen demonstrates nodular hepatic contour compatible with hepatic cirrhosis. This finding is associated with splenomegaly (with the spleen measuring 14.5 cm in length and small amount of perihepatic ascites. Apparent thickening of the gastric fundus, potentially accentuated due to underdistention. There is diffuse thickening of the bilateral adrenal glands, incompletely evaluated. Musculoskeletal: Moderate (approximate 50%) compression deformity involving the superior endplate of C7, similar to neck CT performed 03/26/2017; moderate (approximately 40%) compression deformity involving the superior endplate of B15, similar to the abdominal CT performed 02/2016. Post  paraspinal fusion of the lower cervical spine, incompletely evaluated. IMPRESSION: 1. Extensive bilateral ground-glass alveolar opacities with associated mediastinal and hilar lymphadenopathy - differential considerations are broad though favor multifocal infection (including atypical etiologies), with additional considerations including alveolar pulmonary edema, drug toxicity,  inhalational injury and (less likely) pulmonary hemorrhage. 2. No evidence of pulmonary embolism. 3. Stigmata of cirrhosis and portal venous hypertension including splenomegaly and small amount of perihepatic ascites within the incidentally imaged upper abdomen, similar to dedicated abdominal CT performed 02/2016. 4. Aortic Atherosclerosis (ICD10-I70.0) and Emphysema (ICD10-J43.9). Electronically Signed   By: Sandi Mariscal M.D.   On: 04/25/2017 10:01   Dg Chest Port 1 View  Result Date: 04/26/2017 CLINICAL DATA:  Pneumonia. EXAM: PORTABLE CHEST 1 VIEW COMPARISON:  Chest radiograph 04/24/2017 and CT 04/25/2017 FINDINGS: The cardiomediastinal silhouette is unchanged. Aortic atherosclerosis is noted. There are persistent extensive interstitial and patchy airspace opacities throughout both lungs, not significantly changed. No sizable pleural effusion or pneumothorax is identified. IMPRESSION: Persistent diffuse bilateral lung opacities which may reflect pneumonia. Electronically Signed   By: Logan Bores M.D.   On: 04/26/2017 07:07    ASSESSMENT AND PLAN:  62 yo female w/ hx of DM, HTN, Hyperlipidemia, hx of Cirrhosis, Chronic Bronchitis, Anxiety, Anemia, GERD, Depression, PTSD, Chronic Diastolic CHF, hx of Panic Attack who presented to the hospital due to a fall and noted to have a Left Hip Fracture.   1.  Acute hypoxic respiratory failure Stable Secondary to bilateral HCAP, COPD, and CHF exacerbation  CT chest negative for PE, noted extensive bilateral groundglass opacities with broad differential, elevated pro-calcitonin level   Continue BTs, mucolytic agents, RT following, oxygen/BiPAP with weaning as tolerated, consult pulmonology consulted for expert opinion, blood gas noted for moderate hypoxia, echocardiogram noted for ejection fraction 30-35%, continue Lasix, strict I&O monitoring, daily weights, speech therapy consulted for possible aspiration/dysphagia  2.Acute on chronic diastolic CHF Resolving Acute hypoxia noted this morning April 24, 2017 requiring transfer to ICU for closer monitoring, BiPAP, chest x-ray noted for edema versus pneumonia, CT chest noted above Continue congestive heart failure protocol, Lasix, Aldactone, metoprolol, echocardiogram noted for ejection fraction 30-35%,  Echo from 2016 with normal ejection fraction   3.Acute bilateral HCAP Chest x-ray, CT chest noted above  Continue pneumonia protocol, empiric Cefepime/Vancomycin for 5-day course and follow-up on cultures   4.Acute on COPD exacerbation Stable  Continue IV Solu-Medrol with tapering as tolerated, inhaled corticosteroids, supplemental oxygen prn, mucolytic agents, and all other plans as stated above  5.  S/p Fall and Left Hip Fracture s/pleft hip hemiarthroplastyby orthopedic surgery  6. Hxof gout Stable  Continue allopurinol  7.Hypertension,chronic Stable on current regiment  8.Chronic diabetes mellitus type 2 Stable on current regiment  9.  Chronic tobacco smoking abuse/dependency Cessation counseling and nicotine patch daily  10.  Noncompliant with medical management Stable The importance of compliance was encouraged  Long-term prognosis poor-palliative care was consulted  All the records are reviewed and case discussed with Care Management/Social Workerr. Management plans discussed with the patient, family and they are in agreement.  CODE STATUS: full  TOTAL TIME TAKING CARE OF THIS PATIENT: 45 minutes.     POSSIBLE D/C IN 2-5 DAYS, DEPENDING ON CLINICAL CONDITION.   Avel Peace  Addiel Mccardle M.D on 04/26/2017   Between 7am to 6pm - Pager - [phone]  After 6pm go to [email or web address] - password [email or web address] [facility]  Office  [phone]  CC: Primary care physician; Casilda Carls, MD  Note: This dictation was prepared with Dragon dictation along with smaller phrase technology. Any transcriptional errors that result from this process are unintentional.

## 2017-04-26 NOTE — Progress Notes (Signed)
Maggie, NP notified of patient's oxygen saturations dropping to 87-88% while on VM at 50% with 12 LPM oxygen flow. New order received for bipap. RT at bedside initiating bipap. Will continue to monitor pt closely.

## 2017-04-26 NOTE — Progress Notes (Signed)
   04/26/17 1345  Clinical Encounter Type  Visited With Patient  Visit Type Follow-up   Patient appeared to be sleeping; chaplain to follow up another time.

## 2017-04-26 NOTE — Plan of Care (Signed)
Pt much improved over the course of this shift. Following lactulose enema pt more alert, was able to be taken off of bipap and put on [facility] and has tolerated it well. PT came by this morning and told this RN that an order would have to be put in for PT again, and PT was told that pt was too lethargic and having too many issues with hypoxia to tolerate PT today. With changes over this shift, pt should be able to tolerate PT tomorrow. Additionally, SLP eval was attempted on 3/2 but not completed due to lethargy and hypoxia, but may be able to be completed tomorrow.

## 2017-04-26 NOTE — Progress Notes (Signed)
Notified Maggie, NP that patient refuses to wear bipap; pt placed back on VM at 50%, oxygen saturations at 86-89%. New order received to try patient with NRB. Patient placed on NRB, oxygen saturations improved, currently at 100%. Continue to monitor closely.

## 2017-04-26 NOTE — Progress Notes (Addendum)
Initial Nutrition Assessment  DOCUMENTATION CODES:   Non-severe (moderate) malnutrition in context of chronic illness  INTERVENTION:  No appropriate intervention at this time as patient is too lethargic for PO intake.  Once patient is off BiPAP and diet able to be advanced RD will make recommendations for oral nutrition supplement as appropriate.   Recommend vitamin C 500 mg BID po once diet able to be advanced.  Patient is currently on day 6 of admission and it appears she was not eating well during this admission (bites-25% of meals). If diet unable to be advanced by day 7-10 will need to consider nutrition support.  NUTRITION DIAGNOSIS:   Moderate Malnutrition related to chronic illness(cirrhosis, COPD) as evidenced by mild fat depletion, mild muscle depletion, moderate muscle depletion.  GOAL:   Patient will meet greater than or equal to 90% of their needs  MONITOR:   Diet advancement, Supplement acceptance, Labs, Weight trends, Skin, I & O's  REASON FOR ASSESSMENT:   Consult Assessment of nutrition requirement/status  ASSESSMENT:   62 year old female with PMHx of GERD, cirrhosis, HTN, depression, COPD, IBS, chronic bronchitis, DM type 2, iron deficiency anemia, anxiety, CHF, OCD, panic attacks, PTSD who presented after fall found to have left hip fracture now s/p left hemiarthroplasty 2/27, also having acute hypoxic respiratory failure from acute on chronic CHF exacerbation, acute exacerbation of COPD, pulmonary edema, and PNA.   At time of RD assessment patient was lethargic, on BiPAP, and unable to provide any history. Husband at bedside reports she has not been eating well for a while now. She stays up all night and sleeps during the day so her schedule is off. He reports she does not eat much at a time and will take breaks during meals to go smoke a cigarette. She only eats one good meal per day, which is breakfast, but only finishes about 50% of that meal. The rest of  the meals are really snacks.  UBW has been 160 lbs. Husband reports she occasionally has an increased weight with fluid up to around 200 lbs. Per chart patient got up to 203 lbs on 02/26/2016 but husband reports that was from fluid.  Meal completion: prior to being made NPO patient was finishing bites/sips up to 25% of meals  Medications reviewed and include: Lasix 40 mg BID IV, Novolog 0-9 units Q4hrs, lactulose enema, Solu-Medrol 40 mg Q12hrs IV, pantoprazole, NS @ 40 mL/hr, cefepime.  Labs reviewed: CBG 169-226, CO2 20, BUN 57, Creatinine 1.56.  Discussed with RN and on rounds. Patient is too lethargic for PO intake at this time. Once she is more alert will need SLP evaluation prior to eating.  NUTRITION - FOCUSED PHYSICAL EXAM:    Most Recent Value  Orbital Region  Mild depletion  Upper Arm Region  Mild depletion  Thoracic and Lumbar Region  No depletion  Buccal Region  Mild depletion  Temple Region  Moderate depletion  Clavicle Bone Region  Moderate depletion  Clavicle and Acromion Bone Region  Moderate depletion  Scapular Bone Region  Moderate depletion  Dorsal Hand  Moderate depletion  Patellar Region  Mild depletion  Anterior Thigh Region  Mild depletion  Posterior Calf Region  Mild depletion  Edema (RD Assessment)  Mild  Hair  Reviewed  Eyes  Unable to assess  Mouth  Unable to assess  Skin  Reviewed  Nails  Reviewed    Scattered ecchymosis  Diet Order:  Diet NPO time specified Aspiration precautions  EDUCATION NEEDS:  Not appropriate for education at this time  Skin:  Skin Assessment: Skin Integrity Issues: Skin Integrity Issues:: Incisions, Other (Comment) Incisions: closed incision left hip Other: MSAD to abdomen and groin  Last BM:  04/24/2017 - large type 7  Height:   Ht Readings from Last 1 Encounters:  04/25/17 5\' 4"  (1.626 m)    Weight:   Wt Readings from Last 1 Encounters:  04/25/17 164 lb 0.4 oz (74.4 kg)    Ideal Body Weight:  54.5  kg  BMI:  Body mass index is 28.15 kg/m.  Estimated Nutritional Needs:   Kcal:  1550-1800 (MSJ x 1.2-1.4)  Protein:  75-90 grams (1-1.2 grams/kg)  Fluid:  1.5-1.8 L/day (1 mL/kcal)  Willey Blade, MS, RD, LDN Office: [phone] Pager: [phone] After Hours/Weekend Pager: [phone]

## 2017-04-26 NOTE — Consult Note (Signed)
Consultation Note Date: 04/26/2017   Patient Name: Samantha Mcmillan  DOB: 1956/02/20  MRN: [id number]  Age / Sex: 62 y.o., female  PCP: Casilda Carls, MD Referring Physician: Gorden Harms, MD  Reason for Consultation: Establishing goals of care  HPI/Patient Profile:  Samantha Mcmillan  is a 62 y.o. female with a known history of chronic diastolic heart failure, cirrhosis of liver, bronchial asthma, COPD, GERD, hyperlipidemia, hypertension, irritable bowel syndrome, iron deficiency anemia, type 2 diabetes mellitus presented to the emergency room for fall.   S/p hip fracture with left hemiarthroplasty on 04/22/2017.  Currently on BiPAP   Clinical Assessment and Goals of Care: Patient is resting in bed on BiPAP.  She seems to be alert and oriented at this time but states that she would like her son [name] to be her Media planner.  Both her son and her husband are at bedside.  The patient lives at home with her ex-husband who is now her caregiver.  He states that she has been weak for a few weeks prior to the fall that brought her to the hospital.  He states that her eating is spotty and some days she will eat better than others, but states that she drinks fluids adequately.  He states that she has been hospitalized several times in the past for cirrhosis and several times in the past for cellulitis.  He states that she has been sick in the past like this, and has recovered.  He does state that the difference is that this time she has pneumonia.   We discussed diagnosis, prognosis, and GOC.   The difference between a aggressive medical intervention path  and a palliative comfort care path for this patient at this time was discussed values and goals of care important to patient and family were attempted to be elicited.  Her son states that at this time he wants to do everything possible for her and would like to  keep her a full code, and intubate her if the BiPAP does not work.  He states he is hoping for the best but understands that she may not recover and will take 1 day at a time.  He would also like time to speak to his mother about the decisions that she would want even though she has made him her decision maker.      SUMMARY OF RECOMMENDATIONS    Will revisit tomorrow.  At this time family wants to continue with full aggressive care and a full code.   Code Status/Advance Care Planning:  Full code    Symptom Management:   Per primary team.  Palliative Prophylaxis:   Oral Care  Prognosis:   Unable to determine  Discharge Planning: To Be Determined      Primary Diagnoses: Present on Admission: . Hip fracture ([place])   I have reviewed the medical record, interviewed the patient and family, and examined the patient. The following aspects are pertinent.  Past Medical History:  Diagnosis Date  . Acute on chronic diastolic  CHF (congestive heart failure) ([place]) 05/16/2014  . Anemia   . Anxiety   . Arthritis    "some in my feet" (05/04/2014)  . Asthma   . Chronic bronchitis ([place])    "get it pretty much q yr"   . Chronic lower back pain   . Chronic respiratory failure ([place])   . Cirrhosis of liver ([place])   . Cirrhosis of liver ([place])   . Colon polyp   . COPD (chronic obstructive pulmonary disease) ([place])   . Depression   . Family history of adverse reaction to anesthesia    "my sister doesn't wake up good when she's put deep to sleep"  . GERD (gastroesophageal reflux disease)   . Headache    "more than a couple times/wk" (05/04/2014)  . Heart murmur   . High cholesterol   . History of blood transfusion    "related to my anemia"  . Hypertension   . IBS (irritable bowel syndrome)   . IDA (iron deficiency anemia) 08/14/2014  . Iron deficiency anemia   . OCD (obsessive compulsive disorder)   . On home oxygen therapy    "2L; 24/7" (05/04/2014)  . Panic attack   . Personal  history of tobacco use, presenting hazards to health 06/21/2015  . Pneumonia "several times"  . PTSD (post-traumatic stress disorder)   . Sleep apnea    "can't tolerate CPAP" (05/04/2014)  . Type II diabetes mellitus ([place])    Social History   Socioeconomic History  . Marital status: Divorced    Spouse name: None  . Number of children: None  . Years of education: None  . Highest education level: None  Social Needs  . Financial resource strain: None  . Food insecurity - worry: None  . Food insecurity - inability: None  . Transportation needs - medical: None  . Transportation needs - non-medical: None  Occupational History  . Occupation: disabled  Tobacco Use  . Smoking status: Current Every Day Smoker    Packs/day: 0.50    Years: 48.00    Pack years: 24.00    Types: Cigarettes  . Smokeless tobacco: Never Used  Substance and Sexual Activity  . Alcohol use: No    Alcohol/week: 0.0 oz  . Drug use: No  . Sexual activity: Not Currently  Other Topics Concern  . None  Social History Narrative  . None   Family History  Problem Relation Age of Onset  . Cancer Father        pancreatic  . Cancer Brother   . Breast cancer Maternal Aunt    Scheduled Meds: . budesonide (PULMICORT) nebulizer solution  0.25 mg Nebulization Q12H  . enoxaparin (LOVENOX) injection  40 mg Subcutaneous Q24H  . furosemide  40 mg Intravenous BID  . Influenza vac split quadrivalent PF  0.5 mL Intramuscular Tomorrow-1000  . insulin aspart  0-9 Units Subcutaneous Q4H  . ipratropium-albuterol  3 mL Nebulization Q4H  . lactulose  300 mL Rectal Daily  . mouth rinse  15 mL Mouth Rinse BID  . methylPREDNISolone (SOLU-MEDROL) injection  40 mg Intravenous Q12H  . nicotine  14 mg Transdermal Daily  . pantoprazole (PROTONIX) IV  40 mg Intravenous Q24H   Continuous Infusions: . sodium chloride 40 mL/hr at 04/26/17 0900  . ceFEPime (MAXIPIME) IV Stopped (04/26/17 1105)  . methocarbamol (ROBAXIN)  IV     PRN  Meds:.acetaminophen **OR** acetaminophen, albuterol, alum & mag hydroxide-simeth, bisacodyl, magnesium citrate, methocarbamol **OR** methocarbamol (ROBAXIN)  IV, morphine injection,  ondansetron **OR** ondansetron (ZOFRAN) IV, polyethylene glycol, senna-docusate Medications Prior to Admission:  Prior to Admission medications   Medication Sig Start Date End Date Taking? Authorizing Provider  albuterol (PROAIR HFA) 108 (90 Base) MCG/ACT inhaler ProAir HFA 90 mcg/actuation aerosol inhaler   Yes [provider]  allopurinol (ZYLOPRIM) 100 MG tablet Take 100 mg by mouth daily.    Yes [provider]  ARIPiprazole (ABILIFY) 5 MG tablet Take 5 mg by mouth at bedtime.    Yes [provider]  atorvastatin (LIPITOR) 20 MG tablet Take 20 mg by mouth daily.   Yes [provider]  budesonide-formoterol (SYMBICORT) 80-4.5 MCG/ACT inhaler Inhale 2 puffs into the lungs 2 (two) times daily.    Yes [provider]  bumetanide (BUMEX) 1 MG tablet Take 2 mg by mouth 2 (two) times daily.  07/04/15  Yes [provider]  Calcium Carbonate-Vitamin D (CALTRATE 600+D PO) Take 1 tablet by mouth daily.   Yes [provider]  ferrous fumarate-iron polysaccharide complex (TANDEM) 162-115.2 MG CAPS capsule Take 1 capsule by mouth 2 (two) times daily after a meal. 12/10/16  Yes Cammie Sickle, MD  HYDROmorphone (DILAUDID) 2 MG tablet Take 1 tablet (2 mg total) by mouth every 6 (six) hours as needed for severe pain. Patient taking differently: Take 2 mg by mouth every 8 (eight) hours as needed for severe pain.  07/09/15  Yes Mody, Ulice Bold, MD  insulin glargine (LANTUS) 100 UNIT/ML injection Inject 30 Units into the skin every morning.    Yes [provider]  ipratropium-albuterol (DUONEB) 0.5-2.5 (3) MG/3ML SOLN Take 3 mLs by nebulization every 4 (four) hours as needed.   Yes [provider]  lamoTRIgine (LAMICTAL) 150 MG tablet Take 150 mg by mouth  at bedtime.    Yes [provider]  levothyroxine (SYNTHROID, LEVOTHROID) 88 MCG tablet Take 88 mcg by mouth daily before breakfast.    Yes [provider]  linaclotide (LINZESS) 290 MCG CAPS capsule Take 1 capsule (290 mcg total) daily before breakfast by mouth. 01/01/17  Yes Lucilla Lame, MD  lisinopril (PRINIVIL,ZESTRIL) 20 MG tablet Take 20 mg by mouth daily.   Yes [provider]  magnesium oxide (MAG-OX) 400 MG tablet Take 1 tablet by mouth 2 (two) times daily.    Yes [provider]  metolazone (ZAROXOLYN) 5 MG tablet Take 5 mg by mouth daily.   Yes [provider]  metoprolol (LOPRESSOR) 50 MG tablet Take 50 mg by mouth 2 (two) times daily.   Yes [provider]  Multiple Vitamin (MULTIVITAMIN WITH MINERALS) TABS tablet Take 1 tablet by mouth daily. 05/18/14  Yes Rama, Venetia Maxon, MD  pantoprazole (PROTONIX) 40 MG tablet Take 40 mg by mouth daily.   Yes [provider]  sertraline (ZOLOFT) 100 MG tablet Take 200 mg by mouth daily.    Yes [provider]  spironolactone (ALDACTONE) 25 MG tablet Take 25 mg by mouth daily.   Yes [provider]  albuterol (PROVENTIL) (2.5 MG/3ML) 0.083% nebulizer solution Take 2.5 mg by nebulization every 6 (six) hours as needed for wheezing or shortness of breath.    [provider]  enoxaparin (LOVENOX) 40 MG/0.4ML injection Inject 0.4 mLs (40 mg total) into the skin daily for 14 days. 04/24/17 05/08/17  Henreitta Leber, MD  spironolactone (ALDACTONE) 50 MG tablet Take 1 tablet (50 mg total) by mouth daily. 03/07/16 03/07/17  Earleen Newport, MD  tamsulosin (FLOMAX) 0.4 MG CAPS  capsule Take 1 capsule (0.4 mg total) by mouth daily. 04/24/17   Henreitta Leber, MD  traMADol (ULTRAM) 50 MG tablet Take 1 tablet (50 mg total) by mouth every 6 (six) hours as needed for moderate pain. 04/23/17   Henreitta Leber, MD   Allergies  Allergen Reactions  . Iodine Shortness Of Breath    . Latex Anaphylaxis  . Other Rash, Other (See Comments), Shortness Of Breath, Anaphylaxis and Swelling    Pt states that she is allergic to Darvocet.   . Oxycodone Shortness Of Breath, Rash and Swelling    Other reaction(s): Other (See Comments) wheezing  . Oxycodone-Acetaminophen Other (See Comments), Rash, Swelling and Shortness Of Breath    wheezing wheezing  . Shellfish-Derived Products Anaphylaxis, Rash, Shortness Of Breath and Swelling    Throat closes.  . Amitriptyline Other (See Comments)    Other reaction(s): Other (See Comments) Mental changes Other reaction(s): Other (See Comments) Altered mental status, agitation Reaction:  Mental changes   . Fish-Derived Products Other (See Comments) and Swelling    Other reaction(s): Other (See Comments) "respiratory distress and wheezing" "respiratory distress and wheezing"  . Tape Other (See Comments)    Other reaction(s): Other (See Comments) Pulls skin off Pt states that it pulls her skin off.  Pt states that it pulls her skin off.   . Wellbutrin [Bupropion] Other (See Comments)    Other reaction(s): Other (See Comments) Mental changes Reaction:  Mental changes   . Augmentin [Amoxicillin-Pot Clavulanate] Rash and Other (See Comments)    Has patient had a PCN reaction causing immediate rash, facial/tongue/throat swelling, SOB or lightheadedness with hypotension: No Has patient had a PCN reaction causing severe rash involving mucus membranes or skin necrosis: No Has patient had a PCN reaction that required hospitalization No Has patient had a PCN reaction occurring within the last 10 years: No If all of the above answers are "NO", then may proceed with Cephalosporin use.  . Codeine Rash  . Cortisone Rash  . Diphenhydramine Rash  . Hydrocodone-Acetaminophen Rash    dizzy  . Vicodin [Hydrocodone-Acetaminophen] Rash and Other (See Comments)    Reaction:  Dizziness     Review of Systems  Physical Exam  Vital Signs: BP  106/70   Pulse (!) 111   Temp 98.2 F (36.8 C) (Axillary)   Resp (!) 22   Ht 5\' 4"  (1.626 m)   Wt 74.4 kg (164 lb 0.4 oz)   LMP  (LMP Unknown)   SpO2 96%   BMI 28.15 kg/m  Pain Assessment: 0-10 POSS *See Group Information*: 1-Acceptable,Awake and alert Pain Score: 7    SpO2: SpO2: 96 % O2 Device:SpO2: 96 % O2 Flow Rate: .O2 Flow Rate (L/min): 12 L/min  IO: Intake/output summary:   Intake/Output Summary (Last 24 hours) at 04/26/2017 1518 Last data filed at 04/26/2017 1134 Gross per 24 hour  Intake 883.33 ml  Output 1295 ml  Net -411.67 ml    LBM: Last BM Date: 04/24/17 Baseline Weight: Weight: 70.3 kg (155 lb) Most recent weight: Weight: 74.4 kg (164 lb 0.4 oz)     Palliative Assessment/Data: On BIPAP     Time In: 1:00 Time Out: 2:10  Time Total: 70 min Greater than 50%  of this time was spent counseling and coordinating care related to the above assessment and plan.  Signed by: Asencion Gowda, NP   Please contact Palliative Medicine Team phone at [phone] for questions and concerns.  For individual provider: See Shea Evans

## 2017-04-26 NOTE — Progress Notes (Signed)
Plan is for patient to D/C to [facility] when medically stable. [facility] admissions coordinator at [facility] is aware patient transfered to ICU.   [employer], LCSW [phone]

## 2017-04-26 NOTE — Progress Notes (Signed)
SLP Cancellation Note  Patient Details Name: WINNI EHRHARD MRN: [id number] DOB: 10/03/1955   Cancelled treatment:       Reason Eval/Treat Not Completed: Medical issues which prohibited therapy(chart reviewed; pt is still weaning from BiPAP). Pt transferred to CCU d/t decline in medical and respiratory status' over the weekend. Noted palliative care consult. ST services will f/u tomorrow if pt's respiratory status has improved for BSE hopefully. Recommend frequent oral care when awake for oral hygiene and stimulation of swallowing.    Orinda Kenner, MS, CCC-SLP Watson,Katherine 04/26/2017, 4:25 PM

## 2017-04-26 NOTE — Progress Notes (Signed)
Spoke with Maggie,NP. Pt is hypotensive and HR 110. Per Burman Nieves, NP hold scheduled dose of metoprolol.

## 2017-04-27 LAB — COMPREHENSIVE METABOLIC PANEL
ALT: 51 U/L (ref 14–54)
ANION GAP: 13 (ref 5–15)
AST: 71 U/L — ABNORMAL HIGH (ref 15–41)
Albumin: 1.9 g/dL — ABNORMAL LOW (ref 3.5–5.0)
Alkaline Phosphatase: 101 U/L (ref 38–126)
BUN: 73 mg/dL — ABNORMAL HIGH (ref 6–20)
CHLORIDE: 106 mmol/L (ref 101–111)
CO2: 21 mmol/L — AB (ref 22–32)
Calcium: 8.1 mg/dL — ABNORMAL LOW (ref 8.9–10.3)
Creatinine, Ser: 1.72 mg/dL — ABNORMAL HIGH (ref 0.44–1.00)
GFR, EST AFRICAN AMERICAN: 35 mL/min — AB (ref >60)
GFR, EST NON AFRICAN AMERICAN: 30 mL/min — AB (ref >60)
Glucose, Bld: 197 mg/dL — ABNORMAL HIGH (ref 65–99)
POTASSIUM: 3.8 mmol/L (ref 3.5–5.1)
SODIUM: 140 mmol/L (ref 135–145)
Total Bilirubin: 1.8 mg/dL — ABNORMAL HIGH (ref 0.3–1.2)
Total Protein: 5.2 g/dL — ABNORMAL LOW (ref 6.5–8.1)

## 2017-04-27 LAB — GLUCOSE, CAPILLARY
GLUCOSE-CAPILLARY: 181 mg/dL — AB (ref 65–99)
GLUCOSE-CAPILLARY: 188 mg/dL — AB (ref 65–99)
GLUCOSE-CAPILLARY: 207 mg/dL — AB (ref 65–99)
GLUCOSE-CAPILLARY: 218 mg/dL — AB (ref 65–99)
GLUCOSE-CAPILLARY: 250 mg/dL — AB (ref 65–99)
Glucose-Capillary: 218 mg/dL — ABNORMAL HIGH (ref 65–99)
Glucose-Capillary: 225 mg/dL — ABNORMAL HIGH (ref 65–99)

## 2017-04-27 LAB — CBC WITH DIFFERENTIAL/PLATELET
Basophils Absolute: 0 10*3/uL (ref 0–0.1)
Basophils Relative: 1 %
EOS PCT: 0 %
Eosinophils Absolute: 0 10*3/uL (ref 0–0.7)
HCT: 24.2 % — ABNORMAL LOW (ref 35.0–47.0)
Hemoglobin: 8.1 g/dL — ABNORMAL LOW (ref 12.0–16.0)
LYMPHS ABS: 0.3 10*3/uL — AB (ref 1.0–3.6)
LYMPHS PCT: 4 %
MCH: 31.7 pg (ref 26.0–34.0)
MCHC: 33.3 g/dL (ref 32.0–36.0)
MCV: 95.2 fL (ref 80.0–100.0)
MONO ABS: 0.3 10*3/uL (ref 0.2–0.9)
Monocytes Relative: 3 %
Neutro Abs: 8.7 10*3/uL — ABNORMAL HIGH (ref 1.4–6.5)
Neutrophils Relative %: 92 %
PLATELETS: 89 10*3/uL — AB (ref 150–440)
RBC: 2.54 MIL/uL — ABNORMAL LOW (ref 3.80–5.20)
RDW: 16.1 % — AB (ref 11.5–14.5)
WBC: 9.4 10*3/uL (ref 3.6–11.0)

## 2017-04-27 NOTE — Progress Notes (Addendum)
Daily Progress Note   Patient Name: Samantha Mcmillan       Date: 04/27/2017 DOB: [birth year]  Age: 62 y.o. MRN#: [id number] Attending Physician: Gorden Harms, MD Primary Care Physician: Casilda Carls, MD Admit Date: 04/19/2017  Reason for Consultation/Follow-up: Establishing goals of care  Subjective: Patient resting in bed. Nasal cannula in place. She opens eyes to voice , but goes back to sleep. Son [name] who is POA and a daughter present. They state she typically is awake all night and sleeps during the day. They state she was awake last night and asking to look at social media.   Discussed her status. [name] states he spoke with his mother yesterday regarding Samantha Mcmillan, and she told him she wants to live and is not ready to go. He states he feels that if someone has a chance to keep living, to do what you can. He states he realizes there will come a time she will become sick and not improve, but he does not feel like this is the time. He states he would like for her to remain a full code, he states he has taken CPR classes and knows about it, and would want to try it. He would like to continue aggressive care as indicated.     Length of Stay: 7  Current Medications: Scheduled Meds:  . budesonide (PULMICORT) nebulizer solution  0.25 mg Nebulization Q12H  . enoxaparin (LOVENOX) injection  40 mg Subcutaneous Q24H  . furosemide  40 mg Intravenous BID  . Influenza vac split quadrivalent PF  0.5 mL Intramuscular Tomorrow-1000  . insulin aspart  0-9 Units Subcutaneous Q4H  . ipratropium-albuterol  3 mL Nebulization Q4H  . lactulose  300 mL Rectal Daily  . mouth rinse  15 mL Mouth Rinse BID  . methylPREDNISolone (SOLU-MEDROL) injection  40 mg Intravenous Q12H  . nicotine  14 mg Transdermal  Daily  . pantoprazole (PROTONIX) IV  40 mg Intravenous Q24H    Continuous Infusions: . sodium chloride 40 mL/hr at 04/26/17 1500  . ceFEPime (MAXIPIME) IV 2 g (04/27/17 1016)  . methocarbamol (ROBAXIN)  IV      PRN Meds: acetaminophen **OR** acetaminophen, albuterol, alum & mag hydroxide-simeth, bisacodyl, magnesium citrate, methocarbamol **OR** methocarbamol (ROBAXIN)  IV, metoprolol tartrate, morphine injection, ondansetron **OR** ondansetron (ZOFRAN) IV, polyethylene glycol, senna-docusate  Physical Exam  Constitutional: No distress.  Pulmonary/Chest:  [place]  Skin: Skin is warm and dry.            Vital Signs: BP 103/61   Pulse (!) 113   Temp (!) 97.5 F (36.4 C) (Axillary)   Resp 18   Ht 5\' 4"  (1.626 m)   Wt 69.7 kg (153 lb 10.6 oz)   LMP  (LMP Unknown)   SpO2 100%   BMI 26.38 kg/m  SpO2: SpO2: 100 % O2 Device: O2 Device: Nasal Cannula O2 Flow Rate: O2 Flow Rate (L/min): 4 L/min  Intake/output summary:   Intake/Output Summary (Last 24 hours) at 04/27/2017 1041 Last data filed at 04/27/2017 1016 Gross per 24 hour  Intake 1100 ml  Output 2425 ml  Net -1325 ml   LBM: Last BM Date: 04/24/17 Baseline Weight: Weight: 70.3 kg (155 lb) Most recent weight: Weight: 69.7 kg (153 lb 10.6 oz)       Palliative Assessment/Data:      Patient Active Problem List   Diagnosis Date Noted  . Acute respiratory failure with hypoxia ([place])   . Acute pulmonary edema ([facility])   . Hip fracture ([place]) 04/12/2017  . History of colonic polyps   . Benign neoplasm of transverse colon   . Benign neoplasm of descending colon   . Gastritis without bleeding   . Venous stasis 08/10/2016  . Hypokalemia 12/27/2015  . Acute on chronic diastolic heart failure ([place]) 12/27/2015  . Abdominal pain 11/13/2015  . Chronic pain 11/13/2015  . Closed nondisplaced fracture of seventh cervical vertebra with routine healing 11/13/2015  . Essential hypertension 11/13/2015  . Gallstone pancreatitis 11/13/2015   . Jaundice 11/13/2015  . Closed nondisplaced fracture of seventh cervical vertebra ([place]) 11/01/2015  . ARF (acute renal failure) ([place]) 07/06/2015  . Hyperkalemia 07/06/2015  . Dehydration 07/06/2015  . Personal history of tobacco use, presenting hazards to health 06/21/2015  . Bilateral carotid artery stenosis 06/04/2015  . Mixed hyperlipidemia 06/04/2015  . Fall 04/16/2015  . Bacterial skin infection of leg 10/24/2014  . Edema leg 10/08/2014  . Hypertensive pulmonary vascular disease ([place]) 10/08/2014  . Mixed obsessional thoughts and acts 09/27/2014  . Chronic diastolic heart failure ([place]) 09/21/2014  . Tobacco use 09/21/2014  . Thrombocytopenia ([place]) 09/19/2014  . Leukopenia 09/19/2014  . Stasis dermatitis of left lower extremity with venous ulcer due to chronic peripheral venous hypertension ([place]) 08/15/2014  . IDA (iron deficiency anemia) 08/14/2014  . Ascites 07/25/2014  . Cellulitis and abscess of lower extremity 07/07/2014  . Encephalopathy, hepatic ([place]) 05/18/2014  . Polypharmacy 05/18/2014  . Failure to thrive in adult 05/16/2014  . Hepatic cirrhosis ([place]) 05/16/2014  . Protein-calorie malnutrition, severe ([place]) 05/16/2014  . COPD with acute exacerbation ([place]) 05/16/2014  . Acute on chronic diastolic CHF (congestive heart failure) ([place]) 05/16/2014  . Chronic back pain 05/16/2014  . Weakness 05/15/2014  . Hyponatremia 05/15/2014  . HCAP (healthcare-associated pneumonia) 05/09/2014  . Urinary retention 05/09/2014  . DM2 (diabetes mellitus, type 2) ([place]) 05/09/2014  . Hypothyroidism 05/05/2014  . Cervical stenosis of spine 03/11/2013  . Major depressive disorder, recurrent episode, severe ([place]) 12/05/2012  . Posttraumatic stress disorder 12/05/2012    Palliative Care Assessment & Plan   Patient Profile: Samantha Mcmillan y.o.femalewith a known history of chronic diastolic heart failure, cirrhosis of liver, bronchial asthma, COPD, GERD, hyperlipidemia,  hypertension, irritable bowel syndrome, iron deficiency anemia, type 2 diabetes mellitus presented to the emergency room for fall.  Assessment/Recommendations/Plan:  Patient off BIPAP.   Continue aggressive care as indicated.    Code Status:    Code Status Orders  (From admission, onward)        Start     Ordered   04/04/2017 0009  Full code  Continuous     03/26/2017 0008    Code Status History    Date Active Date Inactive Code Status Order ID Comments User Context   12/27/2015 23:03 12/30/2015 17:05 Full Code [id number]  Lance Coon, MD Inpatient   07/06/2015 19:57 07/09/2015 17:14 Full Code [id number]  Idelle Crouch, MD Inpatient   04/16/2015 06:01 04/17/2015 17:27 Full Code [id number]  Saundra Shelling, MD Inpatient   09/06/2014 16:01 09/08/2014 14:39 Full Code [id number]  Demetrios Loll, MD Inpatient   08/26/2014 22:01 08/29/2014 15:33 Full Code [id number]  Lytle Butte, MD ED   07/07/2014 01:38 07/09/2014 15:57 Full Code [id number]  Juluis Mire, MD Inpatient   05/15/2014 22:45 05/18/2014 18:07 Full Code [id number]  Allyne Gee, MD Inpatient   05/04/2014 16:03 05/09/2014 16:55 Full Code [id number]  Rigoberto Noel, MD ED    Advance Directive Documentation     Most Recent Value  Type of Advance Directive  Healthcare Power of Attorney, Living will  Pre-existing out of facility DNR order (yellow form or pink MOST form)  No data  "MOST" Form in Place?  No data       Prognosis:  Poor long term. Off BIPAP currently. Albumin low. COPD, PNA, cirrhosis. S/p hip fracture and repair.   Discharge Planning:  Family anticipating discharge to rehab.     Thank you for allowing the Palliative Medicine Team to assist in the care of this patient.   Time In: 9:45 Time Out: 10:55 Total Time 70 min Prolonged Time Billed yes      Greater than 50%  of this time was spent counseling and coordinating care related to the above assessment and plan.  Asencion Gowda, NP  Please contact  Palliative Medicine Team phone at [phone] for questions and concerns.

## 2017-04-27 NOTE — Progress Notes (Signed)
[facility] NAME: Samantha Mcmillan    MR#:  [id number]  DATE OF BIRTH:  Sep 11, 1955  SUBJECTIVE:  CHIEF COMPLAINT:   Chief Complaint  Patient presents with  . Hip Pain  . Fall  Continue tachycardia, hypotension, air hunger requiring BiPAP  REVIEW OF SYSTEMS:  CONSTITUTIONAL: No fever, fatigue or weakness.  EYES: No blurred or double vision.  EARS, NOSE, AND THROAT: No tinnitus or ear pain.  RESPIRATORY: No cough, shortness of breath, wheezing or hemoptysis.  CARDIOVASCULAR: No chest pain, orthopnea, edema.  GASTROINTESTINAL: No nausea, vomiting, diarrhea or abdominal pain.  GENITOURINARY: No dysuria, hematuria.  ENDOCRINE: No polyuria, nocturia,  HEMATOLOGY: No anemia, easy bruising or bleeding SKIN: No rash or lesion. MUSCULOSKELETAL: No joint pain or arthritis.   NEUROLOGIC: No tingling, numbness, weakness.  PSYCHIATRY: No anxiety or depression.   ROS  DRUG ALLERGIES:   Allergies  Allergen Reactions  . Iodine Shortness Of Breath  . Latex Anaphylaxis  . Other Rash, Other (See Comments), Shortness Of Breath, Anaphylaxis and Swelling    Pt states that she is allergic to Darvocet.   . Oxycodone Shortness Of Breath, Rash and Swelling    Other reaction(s): Other (See Comments) wheezing  . Oxycodone-Acetaminophen Other (See Comments), Rash, Swelling and Shortness Of Breath    wheezing wheezing  . Shellfish-Derived Products Anaphylaxis, Rash, Shortness Of Breath and Swelling    Throat closes.  . Amitriptyline Other (See Comments)    Other reaction(s): Other (See Comments) Mental changes Other reaction(s): Other (See Comments) Altered mental status, agitation Reaction:  Mental changes   . Fish-Derived Products Other (See Comments) and Swelling    Other reaction(s): Other (See Comments) "respiratory distress and wheezing" "respiratory distress and wheezing"  . Tape Other (See Comments)    Other reaction(s): Other (See  Comments) Pulls skin off Pt states that it pulls her skin off.  Pt states that it pulls her skin off.   . Wellbutrin [Bupropion] Other (See Comments)    Other reaction(s): Other (See Comments) Mental changes Reaction:  Mental changes   . Augmentin [Amoxicillin-Pot Clavulanate] Rash and Other (See Comments)    Has patient had a PCN reaction causing immediate rash, facial/tongue/throat swelling, SOB or lightheadedness with hypotension: No Has patient had a PCN reaction causing severe rash involving mucus membranes or skin necrosis: No Has patient had a PCN reaction that required hospitalization No Has patient had a PCN reaction occurring within the last 10 years: No If all of the above answers are "NO", then may proceed with Cephalosporin use.  . Codeine Rash  . Cortisone Rash  . Diphenhydramine Rash  . Hydrocodone-Acetaminophen Rash    dizzy  . Vicodin [Hydrocodone-Acetaminophen] Rash and Other (See Comments)    Reaction:  Dizziness      VITALS:  Blood pressure 118/79, pulse (!) 126, temperature (!) 97.4 F (36.3 C), temperature source Axillary, resp. rate (!) 24, height 5\' 4"  (1.626 m), weight 69.7 kg (153 lb 10.6 oz), SpO2 100 %.  PHYSICAL EXAMINATION:  GENERAL:  62 y.o.-year-old patient lying in the bed with no acute distress.  EYES: Pupils equal, round, reactive to light and accommodation. No scleral icterus. Extraocular muscles intact.  HEENT: Head atraumatic, normocephalic. Oropharynx and nasopharynx clear.  NECK:  Supple, no jugular venous distention. No thyroid enlargement, no tenderness.  LUNGS: Normal breath sounds bilaterally, no wheezing, rales,rhonchi or crepitation. No use of accessory muscles of respiration.  CARDIOVASCULAR: S1, S2 normal. No murmurs,  rubs, or gallops.  ABDOMEN: Soft, nontender, nondistended. Bowel sounds present. No organomegaly or mass.  EXTREMITIES: No pedal edema, cyanosis, or clubbing.  NEUROLOGIC: Cranial nerves II through XII are intact.  Muscle strength 5/5 in all extremities. Sensation intact. Gait not checked.  PSYCHIATRIC: The patient is alert and oriented x 3.  SKIN: No obvious rash, lesion, or ulcer.   Physical Exam LABORATORY PANEL:   CBC Recent Labs  Lab 04/27/17 0414  WBC 9.4  HGB 8.1*  HCT 24.2*  PLT 89*   ------------------------------------------------------------------------------------------------------------------  Chemistries  Recent Labs  Lab 04/26/17 0513 04/27/17 0414  NA 137 140  K 4.1 3.8  CL 102 106  CO2 20* 21*  GLUCOSE 209* 197*  BUN 57* 73*  CREATININE 1.56* 1.72*  CALCIUM 8.4* 8.1*  MG 2.0  --   AST 80* 71*  ALT 42 51  ALKPHOS 114 101  BILITOT 2.0* 1.8*   ------------------------------------------------------------------------------------------------------------------  Cardiac Enzymes Recent Labs  Lab 04/10/2017 0421 03/26/2017 1018  TROPONINI <0.03 <0.03   ------------------------------------------------------------------------------------------------------------------  RADIOLOGY:  Dg Chest Port 1 View  Result Date: 04/26/2017 CLINICAL DATA:  Pneumonia. EXAM: PORTABLE CHEST 1 VIEW COMPARISON:  Chest radiograph 04/24/2017 and CT 04/25/2017 FINDINGS: The cardiomediastinal silhouette is unchanged. Aortic atherosclerosis is noted. There are persistent extensive interstitial and patchy airspace opacities throughout both lungs, not significantly changed. No sizable pleural effusion or pneumothorax is identified. IMPRESSION: Persistent diffuse bilateral lung opacities which may reflect pneumonia. Electronically Signed   By: Logan Bores M.D.   On: 04/26/2017 07:07    ASSESSMENT AND PLAN:  62 yo female w/ hx of DM, HTN, Hyperlipidemia, hx of Cirrhosis, Chronic Bronchitis, Anxiety, Anemia, GERD, Depression, PTSD, Chronic Diastolic CHF, hx of Panic Attack who presented to the hospital due to a fall and noted to have a Left Hip Fracture.   1. Acute hypoxic respiratory  failure Stable Secondary to bilateralHCAP,COPD,and CHFexacerbation  CT chest negative for PE, noted extensive bilateral groundglass opacities with broad differential, elevated pro-calcitonin level  Continue BTs, mucolytic agents,RTfollowing, oxygen/BiPAP with weaning as tolerated, pulmonology following, blood gas noted for moderate hypoxia, echocardiogram noted for ejection fraction 30-35%, continue Lasix, speech therapy consulted for possible aspiration/dysphagia  2.Acute on chronic diastolic CHF Resolving Acute hypoxia noted this morning April 24, 2017 requiring transfer to ICU for closer monitoring, BiPAP, chest x-ray noted for edema versus pneumonia, CT chest noted above Continue CHF protocol, Lasix, Aldactone, metoprolol, echocardiogram noted for ejection fraction 30-35%,  Echo from 2016 with normal ejection fraction  3.AcutebilateralHCAP Chest x-ray, CT chest noted above  Continue pneumonia protocol, empiricCefepime/Vanco for 5-day course, and follow-up on cultures   4.Acute on COPD exacerbation Stable  ContinueIV Solu-Medrol with tapering as tolerated, inhaled corticosteroids, supplemental oxygenprn,mucolytic agents, and all other plans as stated above  5.S/p Fall and Left Hip Fracture s/pleft hip hemiarthroplastyby orthopedic surgery  6. Hxof gout Stable  Continue allopurinol  7.Hypertension,chronic Stable on current regiment  8.Chronic diabetes mellitus type 2 Stable on current regiment  9.Chronic tobacco smoking abuse/dependency Cessation counseling and nicotine patch daily  10.Noncompliant with medical management Stable The importance of compliance was encouraged  Long-term prognosis is poor-palliative care input appreciated   All the records are reviewed and case discussed with Care Management/Social Workerr. Management plans discussed with the patient, family and they are in agreement.  CODE STATUS: full  TOTAL TIME  TAKING CARE OF THIS PATIENT: 35 minutes.     POSSIBLE D/C IN 2-5 DAYS, DEPENDING ON CLINICAL CONDITION.   Montell  D Salary M.D on 04/27/2017   Between 7am to 6pm - Pager - [phone]  After 6pm go to [email or web address] - password [email or web address] [facility]  Office  [phone]  CC: Primary care physician; Casilda Carls, MD  Note: This dictation was prepared with Dragon dictation along with smaller phrase technology. Any transcriptional errors that result from this process are unintentional.

## 2017-04-27 NOTE — Evaluation (Addendum)
Clinical/Bedside Swallow Evaluation Patient Details  Name: JANIAYA RYSER MRN: [id number] Date of Birth: 1956-01-04  Today's Date: 04/27/2017 Time: SLP Start Time (ACUTE ONLY): 1400 SLP Stop Time (ACUTE ONLY): 1500 SLP Time Calculation (min) (ACUTE ONLY): 60 min  Past Medical History:  Past Medical History:  Diagnosis Date  . Acute on chronic diastolic CHF (congestive heart failure) ([place]) 05/16/2014  . Anemia   . Anxiety   . Arthritis    "some in my feet" (05/04/2014)  . Asthma   . Chronic bronchitis ([place])    "get it pretty much q yr"   . Chronic lower back pain   . Chronic respiratory failure ([place])   . Cirrhosis of liver ([place])   . Cirrhosis of liver ([place])   . Colon polyp   . COPD (chronic obstructive pulmonary disease) ([place])   . Depression   . Family history of adverse reaction to anesthesia    "my sister doesn't wake up good when she's put deep to sleep"  . GERD (gastroesophageal reflux disease)   . Headache    "more than a couple times/wk" (05/04/2014)  . Heart murmur   . High cholesterol   . History of blood transfusion    "related to my anemia"  . Hypertension   . IBS (irritable bowel syndrome)   . IDA (iron deficiency anemia) 08/14/2014  . Iron deficiency anemia   . OCD (obsessive compulsive disorder)   . On home oxygen therapy    "2L; 24/7" (05/04/2014)  . Panic attack   . Personal history of tobacco use, presenting hazards to health 06/21/2015  . Pneumonia "several times"  . PTSD (post-traumatic stress disorder)   . Sleep apnea    "can't tolerate CPAP" (05/04/2014)  . Type II diabetes mellitus ([place])    Past Surgical History:  Past Surgical History:  Procedure Laterality Date  . BACK SURGERY    . CATARACT EXTRACTION W/ INTRAOCULAR LENS  IMPLANT, BILATERAL Bilateral 2005  . CESAREAN SECTION  1979  . COLONOSCOPY  2014  . COLONOSCOPY WITH PROPOFOL N/A 12/08/2016   Procedure: COLONOSCOPY WITH PROPOFOL;  Surgeon: Lucilla Lame, MD;  Location[facility] ENDOSCOPY;   Service: Endoscopy;  Laterality: N/A;  . DILATION AND CURETTAGE OF UTERUS    . ESOPHAGOGASTRODUODENOSCOPY (EGD) WITH PROPOFOL N/A 12/08/2016   Procedure: ESOPHAGOGASTRODUODENOSCOPY (EGD) WITH PROPOFOL;  Surgeon: Lucilla Lame, MD;  Location: [facility] ENDOSCOPY;  Service: Endoscopy;  Laterality: N/A;  . HIP ARTHROPLASTY Left 04/06/2017   Procedure: ARTHROPLASTY BIPOLAR HIP (HEMIARTHROPLASTY);  Surgeon: Thornton Park, MD;  Location: [facility] ORS;  Service: Orthopedics;  Laterality: Left;  . INCISION AND DRAINAGE OF WOUND Right 2009   "leg mauled  by dog"  . PERIPHERAL VASCULAR CATHETERIZATION N/A 08/16/2014   Procedure: PICC Line Insertion;  Surgeon: Algernon Huxley, MD;  Location: [place] CV LAB;  Service: Cardiovascular;  Laterality: N/A;  . POSTERIOR FUSION CERVICAL SPINE  2015   "rebuilt 3 of my neck vertebrae"  . TUBAL LIGATION  1979  . UPPER GASTROINTESTINAL ENDOSCOPY     HPI:   Pt is a 62 y.o. female with a history of cirrhosis of the liver, anemia, CHF, COPD, anxiety/depression, GERD, IBS, IDA, OCD, DM, hypertension, on home O2, PTSD, sleep apnea but cannot won't wear CPAP, and tobacco usewho presents for evaluation of left hip pain status post mechanical fall. Patient reports that she had just finished washing the dishes and was walking from the kitchen to the living room when her foot got caught and she  fell onto the left hip. Pt had repair of the left femoral neck fracture on 04/04/2017. She has experienced hepatic encephalopathy in the setting of COPD and diastolic heart failure requiring increased O2 support and was transferred to CCU stepdown. She is improved now and more alert to others.        Assessment / Plan / Recommendation Clinical Impression  Pt appears to present w/ oropharyngeal phase dysphagia w/ overt s/s of aspiration w/ trials of thin liquids. During pt's lengthy cough response, pt also exhibited increased HR into the 150's. Pt required rest break to calm again. Pt appeared  to adequately tolerate small bolus trials of NECTAR consistency liquids and purees given after but required frequent rest breaks to avoid increased HR and respiratory effort. Pt is slow to follow through w/ po tasks d/t the exertion/fatigue. These factors increase risk for aspiration as well. Oral phase appeared grossly wfl for bolus management, transfer, and oral clearing of consistencies given. Pt helped to feed self bu required cues. OM exam revealed no decreased tone/strength.  Recommend initiation of Pleasure bites/sips of Nectar liquids, applesauce w/ NSG supervision and strict aspiration precautions. If tolerates, then increased to full Dysphagia 1 w/ Nectar liquids diet w/ ongoing toleration of diet and trials to upgrade as appropriate per medical/respiratory status'. NSG updated, agreed. Family and pt given explanation and education on above; aspiration precautions.  SLP Visit Diagnosis: Dysphagia, oropharyngeal phase (R13.12)    Aspiration Risk  Moderate aspiration risk;Risk for inadequate nutrition/hydration    Diet Recommendation  initial trials of NECTAR consistency liquids; purees for Pleasure w/ NSG supervision and STRICT aspiration precautions; Rest Breaks to lessen WOB/SOB  Medication Administration: Crushed with puree(for easier, safer swallowing)    Other  Recommendations Recommended Consults: (Dietician f/u; palliative care f/u) Oral Care Recommendations: Oral care BID;Patient independent with oral care;Staff/trained caregiver to provide oral care Other Recommendations: Order thickener from pharmacy;Prohibited food (jello, ice cream, thin soups);Remove water pitcher;Have oral suction available   Follow up Recommendations Skilled Nursing facility      Frequency and Duration min 3x week  2 weeks       Prognosis Prognosis for Safe Diet Advancement: Fair Barriers to Reach Goals: Severity of deficits(extended illness)      Swallow Study   General Date of Onset:  04/16/2017 Type of Study: Bedside Swallow Evaluation Previous Swallow Assessment: none reported Diet Prior to this Study: NPO(regular diet at home prior to admission) Temperature Spikes Noted: No(wbc 9.4) Respiratory Status: Nasal cannula(2-4 liters) History of Recent Intubation: No(but has been on BiPAP) Behavior/Cognition: Alert;Cooperative;Pleasant mood;Distractible;Requires cueing Oral Cavity Assessment: Dry;Dried secretions Oral Care Completed by SLP: Yes Oral Cavity - Dentition: Missing dentition Vision: Functional for self-feeding Self-Feeding Abilities: Able to feed self;Needs assist;Needs set up;Total assist(UE weakness in general) Patient Positioning: Upright in bed Baseline Vocal Quality: Low vocal intensity(fairly WFL) Volitional Cough: Strong;Congested(min) Volitional Swallow: Able to elicit    Oral/Motor/Sensory Function Overall Oral Motor/Sensory Function: Within functional limits   Ice Chips Ice chips: Within functional limits Presentation: Spoon(fed; 6 trials)   Thin Liquid Thin Liquid: Impaired Presentation: Cup;Self Fed(assisted; 2 swallows during trial) Oral Phase Impairments: (none) Oral Phase Functional Implications: (none) Pharyngeal  Phase Impairments: Cough - Immediate(lengthy)    Nectar Thick Nectar Thick Liquid: Within functional limits Presentation: Cup;Self Fed(supported; ~2 ozs) Other Comments: needed rest break   Honey Thick Honey Thick Liquid: Not tested   Puree Puree: Within functional limits Presentation: Spoon;Self Fed(supported; 4-5 (1/4 tsp trials)) Other Comments: took rest break  Solid   GO   Solid: Not tested         Orinda Kenner, MS, CCC-SLP Brentlee Delage 04/27/2017,6:03 PM

## 2017-04-27 NOTE — Progress Notes (Signed)
Subjective:  Saw patient in ICU 4 this afternoon approximately 2pm.  Family was at the bedside.  Patient reports left pain as mild to moderate.  Patient transferred to ICU for respiratory distress and hepatic encephalopathy.  No acute orthopaedic issues.  Objective:   VITALS:   Vitals:   04/27/17 1500 04/27/17 1502 04/27/17 1600 04/27/17 1700  BP: 105/63  109/73 95/70  Pulse:   (!) 129 (!) 126  Resp: (!) 24  (!) 26 20  Temp:    98.3 F (36.8 C)  TempSrc:    Axillary  SpO2:  99% 93% (!) 89%  Weight:      Height:        PHYSICAL EXAM: Left lower extremity: Neurovascular intact Sensation intact distally Intact pulses distally Dorsiflexion/Plantar flexion intact Incision: dressing C/D/I No cellulitis present Compartment soft  LABS  Results for orders placed or performed during the hospital encounter of 04/07/2017 (from the past 24 hour(s))  Glucose, capillary     Status: Abnormal   Collection Time: 04/26/17  7:46 PM  Result Value Ref Range   Glucose-Capillary 167 (H) 65 - 99 mg/dL  Glucose, capillary     Status: Abnormal   Collection Time: 04/27/17 12:38 AM  Result Value Ref Range   Glucose-Capillary 218 (H) 65 - 99 mg/dL  Glucose, capillary     Status: Abnormal   Collection Time: 04/27/17  4:05 AM  Result Value Ref Range   Glucose-Capillary 181 (H) 65 - 99 mg/dL  CBC with Differential/Platelet     Status: Abnormal   Collection Time: 04/27/17  4:14 AM  Result Value Ref Range   WBC 9.4 3.6 - 11.0 K/uL   RBC 2.54 (L) 3.80 - 5.20 MIL/uL   Hemoglobin 8.1 (L) 12.0 - 16.0 g/dL   HCT 24.2 (L) 35.0 - 47.0 %   MCV 95.2 80.0 - 100.0 fL   MCH 31.7 26.0 - 34.0 pg   MCHC 33.3 32.0 - 36.0 g/dL   RDW 16.1 (H) 11.5 - 14.5 %   Platelets 89 (L) 150 - 440 K/uL   Neutrophils Relative % 92 %   Neutro Abs 8.7 (H) 1.4 - 6.5 K/uL   Lymphocytes Relative 4 %   Lymphs Abs 0.3 (L) 1.0 - 3.6 K/uL   Monocytes Relative 3 %   Monocytes Absolute 0.3 0.2 - 0.9 K/uL   Eosinophils Relative 0 %    Eosinophils Absolute 0.0 0 - 0.7 K/uL   Basophils Relative 1 %   Basophils Absolute 0.0 0 - 0.1 K/uL  Comprehensive metabolic panel     Status: Abnormal   Collection Time: 04/27/17  4:14 AM  Result Value Ref Range   Sodium 140 135 - 145 mmol/L   Potassium 3.8 3.5 - 5.1 mmol/L   Chloride 106 101 - 111 mmol/L   CO2 21 (L) 22 - 32 mmol/L   Glucose, Bld 197 (H) 65 - 99 mg/dL   BUN 73 (H) 6 - 20 mg/dL   Creatinine, Ser 1.72 (H) 0.44 - 1.00 mg/dL   Calcium 8.1 (L) 8.9 - 10.3 mg/dL   Total Protein 5.2 (L) 6.5 - 8.1 g/dL   Albumin 1.9 (L) 3.5 - 5.0 g/dL   AST 71 (H) 15 - 41 U/L   ALT 51 14 - 54 U/L   Alkaline Phosphatase 101 38 - 126 U/L   Total Bilirubin 1.8 (H) 0.3 - 1.2 mg/dL   GFR calc non Af Amer 30 (L) >60 mL/min   GFR calc Af Wyvonnia Lora  35 (L) >60 mL/min   Anion gap 13 5 - 15  Glucose, capillary     Status: Abnormal   Collection Time: 04/27/17  7:41 AM  Result Value Ref Range   Glucose-Capillary 188 (H) 65 - 99 mg/dL  Glucose, capillary     Status: Abnormal   Collection Time: 04/27/17 11:55 AM  Result Value Ref Range   Glucose-Capillary 218 (H) 65 - 99 mg/dL  Glucose, capillary     Status: Abnormal   Collection Time: 04/27/17  3:53 PM  Result Value Ref Range   Glucose-Capillary 207 (H) 65 - 99 mg/dL    Dg Chest Port 1 View  Result Date: 04/26/2017 CLINICAL DATA:  Pneumonia. EXAM: PORTABLE CHEST 1 VIEW COMPARISON:  Chest radiograph 04/24/2017 and CT 04/25/2017 FINDINGS: The cardiomediastinal silhouette is unchanged. Aortic atherosclerosis is noted. There are persistent extensive interstitial and patchy airspace opacities throughout both lungs, not significantly changed. No sizable pleural effusion or pneumothorax is identified. IMPRESSION: Persistent diffuse bilateral lung opacities which may reflect pneumonia. Electronically Signed   By: Logan Bores M.D.   On: 04/26/2017 07:07    Assessment/Plan: 6 Days Post-Op   Active Problems:   COPD with acute exacerbation ([place])   Hip  fracture (HCC)   Acute respiratory failure with hypoxia (HCC)   Acute pulmonary edema (HCC)  Patient's PT on hold until she is more stable.  I will continue to follow from an orthopaedic standpoint.   Patient is WBAT on the left lower extremity.  Continue lovenox.     Thornton Park , MD 04/27/2017, 6:11 PM

## 2017-04-27 NOTE — Consult Note (Signed)
PULMONARY / CRITICAL CARE MEDICINE   Name: Samantha Mcmillan MRN: [id number] DOB: 05-15-1955    ADMISSION DATE:  03/29/2017   CONSULTATION DATE:  04/25/2017  REFERRING MD:  Dr. Jerelyn Charles  REASON: Acute respiratory failure and AMS  HISTORY OF PRESENT ILLNESS:   Off of biPAP More alert and awake Lactulose enemas +SOB No cough No fevers No pain  ROS Less SOB Otherwise negative  SUBJECTIVE:   VITAL SIGNS: BP 103/61   Pulse (!) 113   Temp (!) 97.5 F (36.4 C) (Axillary)   Resp 18   Ht 5\' 4"  (1.626 m)   Wt 153 lb 10.6 oz (69.7 kg)   LMP  (LMP Unknown)   SpO2 100%   BMI 26.38 kg/m   HEMODYNAMICS:    VENTILATOR SETTINGS: FiO2 (%):  [30 %-35 %] 35 %  INTAKE / OUTPUT: I/O last 3 completed shifts: In: 1723.3 [I.V.:1423.3; IV Piggyback:300] Out: 3120 [Urine:2120; Stool:1000]  PHYSICAL EXAMINATION: General: Appears acutely ill Neuro: Alert to person only, moves all extremities, follows some basic commands HEENT: PERRLA, trachea midline, mild JVD Cardiovascular: Sinus tachycardia, S1-S2, no murmur regurg or gallop, +2 pulses, +1 edema bilaterally Lungs: Increased work of breathing, diffuse rhonchi in anterior and posterior lung fields, breath sounds significantly diminished bilaterally, bibasilar rales Abdomen: Nondistended, normal bowel sounds Musculoskeletal: Knitted range of motion in left lower extremity bilateral upper extremities with normal range of motion, no deformities Skin: Multiple bruises in bilateral upper extremities, skin is warm and dry without any rash  LABS:  BMET Recent Labs  Lab 04/25/17 0356 04/26/17 0513 04/27/17 0414  NA 134* 137 140  K 4.0 4.1 3.8  CL 100* 102 106  CO2 22 20* 21*  BUN 43* 57* 73*  CREATININE 1.34* 1.56* 1.72*  GLUCOSE 201* 209* 197*    Electrolytes Recent Labs  Lab 04/25/17 0356 04/26/17 0513 04/27/17 0414  CALCIUM 8.5* 8.4* 8.1*  MG  --  2.0  --   PHOS  --  5.9*  --     CBC Recent Labs  Lab  04/24/17 0347 04/26/17 0513 04/27/17 0414  WBC 9.1 12.7* 9.4  HGB 8.6* 8.3* 8.1*  HCT 25.9* 25.6* 24.2*  PLT 111* 119* 89*    Coag's Recent Labs  Lab 04/17/2017 2330 03/27/2017 0421  INR 1.12 1.19    Sepsis Markers Recent Labs  Lab 04/24/17 1040 04/26/17 0513  LATICACIDVEN  --  1.2  PROCALCITON 0.64  --     ABG Recent Labs  Lab 04/25/17 1130  PHART 7.39  PCO2ART 39  PO2ART 51*    Liver Enzymes Recent Labs  Lab 03/29/2017 2330 04/26/17 0513 04/27/17 0414  AST 79* 80* 71*  ALT 35 42 51  ALKPHOS 252* 114 101  BILITOT 0.7 2.0* 1.8*  ALBUMIN 2.5* 1.9* 1.9*    Cardiac Enzymes Recent Labs  Lab 04/19/2017 2256 04/04/2017 0421 04/06/2017 1018  TROPONINI <0.03 <0.03 <0.03    Glucose Recent Labs  Lab 04/26/17 1131 04/26/17 1624 04/26/17 1946 04/27/17 0038 04/27/17 0405 04/27/17 0741  GLUCAP 226* 197* 167* 218* 181* 188*    Imaging No results found.  STUDIES:  ECHO 04/25/2017 LV EF: 30% -   35%  CULTURES: Influenza swab  ANTIBIOTICS: Vancomycin 3/3> Cefepime 3/3>  SIGNIFICANT EVENTS: 02/28>L-Hip arthroplasty 03/03>ICU with respiratory failure  LINES/TUBES: PIVs  DISCUSSION: 62 year old female admitted with a left hip fracture from a traumatic fall at home now presenting with acute respiratory failure secondary to pneumonia and pulmonary edema from CHF exacerbation  ASSESSMENT  Acute hypoxic respiratory failure Acute encephalopathy secondary to hypoxemia Acute on chronic CHF exacerbation Acute on chronic COPD exacerbation Acute pulmonary edema Acute renal failure Healthcare acquired pneumonia Left hip fracture status post left hemiarthroplasty PLAN SD status IV diuretics Continuous BiPAP and titrate to nasal cannula as tolerated As needed lorazepam for claustrophobia-give when on BiPAP only Nebulized bronchodilators IV and inhaled steroids Trend creatinine Monitor and replace electrolytes Blood glucose monitoring with sliding scale  insulin coverage GI and DVT prophylaxis Patient is a full code.   Palliative care team consulted    Jarl Sellitto Patricia Pesa, M.D.  [facility]  Medical Director [place] Director [facility] Cardio-Pulmonary Department

## 2017-04-27 NOTE — Progress Notes (Signed)
More alert this evening and more talkative with family.  No respiratory distress on 3 L nasal cannula.  Tolerating pleasure sips of nectar thick liquids.

## 2017-04-28 ENCOUNTER — Inpatient Hospital Stay: Payer: Medicare Other

## 2017-04-28 LAB — BASIC METABOLIC PANEL
Anion gap: 11 (ref 5–15)
BUN: 80 mg/dL — ABNORMAL HIGH (ref 6–20)
CALCIUM: 8.4 mg/dL — AB (ref 8.9–10.3)
CO2: 22 mmol/L (ref 22–32)
CREATININE: 1.63 mg/dL — AB (ref 0.44–1.00)
Chloride: 110 mmol/L (ref 101–111)
GFR calc non Af Amer: 33 mL/min — ABNORMAL LOW (ref >60)
GFR, EST AFRICAN AMERICAN: 38 mL/min — AB (ref >60)
Glucose, Bld: 262 mg/dL — ABNORMAL HIGH (ref 65–99)
Potassium: 3.6 mmol/L (ref 3.5–5.1)
Sodium: 143 mmol/L (ref 135–145)

## 2017-04-28 LAB — CBC WITH DIFFERENTIAL/PLATELET
BASOS ABS: 0 10*3/uL (ref 0–0.1)
BASOS PCT: 0 %
EOS ABS: 0 10*3/uL (ref 0–0.7)
Eosinophils Relative: 0 %
HEMATOCRIT: 26.2 % — AB (ref 35.0–47.0)
HEMOGLOBIN: 8.1 g/dL — AB (ref 12.0–16.0)
Lymphocytes Relative: 2 %
Lymphs Abs: 0.3 10*3/uL — ABNORMAL LOW (ref 1.0–3.6)
MCH: 30.5 pg (ref 26.0–34.0)
MCHC: 31.1 g/dL — ABNORMAL LOW (ref 32.0–36.0)
MCV: 97.9 fL (ref 80.0–100.0)
Monocytes Absolute: 0.3 10*3/uL (ref 0.2–0.9)
Monocytes Relative: 2 %
NEUTROS ABS: 10.7 10*3/uL — AB (ref 1.4–6.5)
NEUTROS PCT: 96 %
Platelets: 106 10*3/uL — ABNORMAL LOW (ref 150–440)
RBC: 2.67 MIL/uL — AB (ref 3.80–5.20)
RDW: 16.7 % — ABNORMAL HIGH (ref 11.5–14.5)
WBC: 11.2 10*3/uL — AB (ref 3.6–11.0)

## 2017-04-28 LAB — GLUCOSE, CAPILLARY
GLUCOSE-CAPILLARY: 241 mg/dL — AB (ref 65–99)
GLUCOSE-CAPILLARY: 229 mg/dL — AB (ref 65–99)
Glucose-Capillary: 305 mg/dL — ABNORMAL HIGH (ref 65–99)
Glucose-Capillary: 304 mg/dL — ABNORMAL HIGH (ref 65–99)
Glucose-Capillary: 347 mg/dL — ABNORMAL HIGH (ref 65–99)

## 2017-04-28 MED ORDER — FUROSEMIDE 20 MG PO TABS
40.0000 mg | ORAL_TABLET | Freq: Every day | ORAL | Status: DC
  Administered 2017-04-28 – 2017-04-29 (×2): 40 mg via ORAL
  Filled 2017-04-28: qty 1
  Filled 2017-04-28: qty 2

## 2017-04-28 MED ORDER — LACTULOSE 10 GM/15ML PO SOLN
30.0000 g | Freq: Three times a day (TID) | ORAL | Status: DC
  Administered 2017-04-28 – 2017-05-02 (×9): 30 g via ORAL
  Filled 2017-04-28 (×10): qty 60

## 2017-04-28 MED ORDER — VITAMIN C 500 MG PO TABS
500.0000 mg | ORAL_TABLET | Freq: Two times a day (BID) | ORAL | Status: DC
  Administered 2017-04-29 – 2017-05-06 (×15): 500 mg via ORAL
  Filled 2017-04-28 (×20): qty 1

## 2017-04-28 MED ORDER — LEVALBUTEROL HCL 1.25 MG/0.5ML IN NEBU
1.2500 mg | INHALATION_SOLUTION | Freq: Four times a day (QID) | RESPIRATORY_TRACT | Status: DC
  Administered 2017-04-29 – 2017-04-30 (×7): 1.25 mg via RESPIRATORY_TRACT
  Filled 2017-04-28 (×9): qty 0.5

## 2017-04-28 NOTE — Progress Notes (Signed)
Patient is transferring out of the ICU back to 1A today. Per [facility] admissions coordinator at [facility] she will re-start [facility] authorization. Plan is for patient to D/C to [place] pending medical clearance and [facility] authorization.   [employer], LCSW [phone]

## 2017-04-28 NOTE — Progress Notes (Deleted)
HR 170. Prn Metoprolol given.

## 2017-04-28 NOTE — Progress Notes (Signed)
SLP Cancellation Note  Patient Details Name: MARTINIQUE PIZZIMENTI MRN: [id number] DOB: [birth year]   Cancelled treatment:       Reason Eval/Treat Not Completed: Patient at procedure or test/unavailable(chart reviewed; consulted NSG on both units ). Pt being moved from CCU to 1A at this time. Due to pt's toleration of the Nectar consistency diet and pleasure bites of puree(applesauce) w/ NSG yesterday, pt's diet is upgraded to Dysphagia level 1(puree) w/ Nectar liquids. Recommend continued aspiration precautions; supervision w/ po's. Pt may continue w/ Pleasure single ice chips b/t meals post oral care w/ aspiration precautions, supervision - stop if any increased s/s of aspiration noted. All precautions posted, NSG updated. ST services will f/u w/ pt's toleration of diet and trials to upgrade next 1-2 days.     Orinda Kenner, MS, CCC-SLP Watson,Katherine 04/28/2017, 1:58 PM

## 2017-04-28 NOTE — Progress Notes (Signed)
HR ranging between 138. BP 94/61. Pt does not appear to be in any distress. MD notified. New orders obtained.

## 2017-04-28 NOTE — Progress Notes (Signed)
I was called by Telemetry that patient's heart rate was elevated to 160s for sustained period of time at 1505.  Pt denied chest pain and SOB. Pt had not received any breathing treatments and was not doing any strenuous activity. Dr. Jerelyn Charles called and asked this nurse to call Dr. Mortimer Fries and see if she required a return to the ICU. Dr. Mortimer Fries then paged and returned call.  Dr. Mortimer Fries stated that since she was asymptomatic and returning to 130s that she did not require any further medication or return to the ICU.  Telemetry called back at 1520 and reported that patient had heart rate 160s. Telemetry informed patient was staying on 1A. Telemetry reported that they will increase parameters to 165 and only call for rate over that.

## 2017-04-28 NOTE — Progress Notes (Signed)
Subjective:  Patient reports left hip pain as mild.   Breathing is improving.  Dr.Kasa's note indicates patient may be moved out of ICU to regular medical floor.    Objective:   VITALS:   Vitals:   04/28/17 0800 04/28/17 0900 04/28/17 1000 04/28/17 1100  BP: 107/66 102/65 114/64 (!) 119/57  Pulse: (!) 131 (!) 158 (!) 184 (!) 193  Resp: (!) 23 (!) 22 (!) 21 (!) 27  Temp: 98.3 F (36.8 C)     TempSrc: Oral     SpO2: 93% 96% 94% (!) 89%  Weight:      Height:        PHYSICAL EXAM: Left lower extremity: Bandages on both LE for pressure ulcers. Neurovascular intact Sensation intact distally Intact pulses distally Dorsiflexion/Plantar flexion intact Incision: dressing C/D/I No cellulitis present Compartment soft  LABS  Results for orders placed or performed during the hospital encounter of 04/12/2017 (from the past 24 hour(s))  Glucose, capillary     Status: Abnormal   Collection Time: 04/27/17  3:53 PM  Result Value Ref Range   Glucose-Capillary 207 (H) 65 - 99 mg/dL  Glucose, capillary     Status: Abnormal   Collection Time: 04/27/17  7:46 PM  Result Value Ref Range   Glucose-Capillary 225 (H) 65 - 99 mg/dL   Comment 1 Notify RN    Comment 2 Document in Chart   Glucose, capillary     Status: Abnormal   Collection Time: 04/27/17 11:41 PM  Result Value Ref Range   Glucose-Capillary 250 (H) 65 - 99 mg/dL   Comment 1 Notify RN    Comment 2 Document in Chart   Glucose, capillary     Status: Abnormal   Collection Time: 04/28/17  3:52 AM  Result Value Ref Range   Glucose-Capillary 241 (H) 65 - 99 mg/dL  Glucose, capillary     Status: Abnormal   Collection Time: 04/28/17  7:20 AM  Result Value Ref Range   Glucose-Capillary 229 (H) 65 - 99 mg/dL   Comment 1 Notify RN   Basic metabolic panel     Status: Abnormal   Collection Time: 04/28/17  7:53 AM  Result Value Ref Range   Sodium 143 135 - 145 mmol/L   Potassium 3.6 3.5 - 5.1 mmol/L   Chloride 110 101 - 111 mmol/L   CO2 22 22 - 32 mmol/L   Glucose, Bld 262 (H) 65 - 99 mg/dL   BUN 80 (H) 6 - 20 mg/dL   Creatinine, Ser 1.63 (H) 0.44 - 1.00 mg/dL   Calcium 8.4 (L) 8.9 - 10.3 mg/dL   GFR calc non Af Amer 33 (L) >60 mL/min   GFR calc Af Amer 38 (L) >60 mL/min   Anion gap 11 5 - 15  CBC with Differential/Platelet     Status: Abnormal   Collection Time: 04/28/17  7:53 AM  Result Value Ref Range   WBC 11.2 (H) 3.6 - 11.0 K/uL   RBC 2.67 (L) 3.80 - 5.20 MIL/uL   Hemoglobin 8.1 (L) 12.0 - 16.0 g/dL   HCT 26.2 (L) 35.0 - 47.0 %   MCV 97.9 80.0 - 100.0 fL   MCH 30.5 26.0 - 34.0 pg   MCHC 31.1 (L) 32.0 - 36.0 g/dL   RDW 16.7 (H) 11.5 - 14.5 %   Platelets 106 (L) 150 - 440 K/uL   Neutrophils Relative % 96 %   Neutro Abs 10.7 (H) 1.4 - 6.5 K/uL   Lymphocytes Relative  2 %   Lymphs Abs 0.3 (L) 1.0 - 3.6 K/uL   Monocytes Relative 2 %   Monocytes Absolute 0.3 0.2 - 0.9 K/uL   Eosinophils Relative 0 %   Eosinophils Absolute 0.0 0 - 0.7 K/uL   Basophils Relative 0 %   Basophils Absolute 0.0 0 - 0.1 K/uL    No results found.  Assessment/Plan: 7 Days Post-Op   Active Problems:   COPD with acute exacerbation (HCC)   Hip fracture (HCC)   Acute respiratory failure with hypoxia (HCC)   Acute pulmonary edema ([place])  Patient stable from an orthopaedic standpoint.  Recommend patient reinitiate physical therapy when cleared to do so medically.  Continue lovenox.    Thornton Park , MD 04/28/2017, 12:58 PM

## 2017-04-28 NOTE — Consult Note (Signed)
PULMONARY / CRITICAL CARE MEDICINE   Name: RUDENE POULSEN MRN: [id number] DOB: [birth year]    ADMISSION DATE:  03/30/2017   CONSULTATION DATE:  04/25/2017  REFERRING MD:  Dr. Jerelyn Charles  REASON: Acute respiratory failure and AMS  HISTORY OF PRESENT ILLNESS:   Off of biPAP More alert and awake Lactulose enemas  Ok to transfer to gen med floor  ROS Less SOB Otherwise negative   VITAL SIGNS: BP 101/64   Pulse (!) 107   Temp 98.4 F (36.9 C) (Oral)   Resp (!) 22   Ht 5\' 4"  (1.626 m)   Wt 156 lb 8.4 oz (71 kg)   LMP  (LMP Unknown)   SpO2 97%   BMI 26.87 kg/m      VENTILATOR SETTINGS: FiO2 (%):  [35 %] 35 %  INTAKE / OUTPUT: I/O last 3 completed shifts: In: 2140 [I.V.:1240; Other:500; IV Piggyback:400] Out: 2335 [Urine:2135; Stool:200]  PHYSICAL EXAMINATION: General: alert and awake, no acute distress Neuro: Alert to person only, moves all extremities, follows some basic commands HEENT: PERRLA, trachea midline, mild JVD Cardiovascular: Sinus tachycardia, S1-S2, no murmur regurg or gallop, +2 pulses, +1 edema bilaterally Lungs: CTA b/l no wheezes Abdomen: Nondistended, normal bowel sounds Musculoskeletal: Knitted range of motion in left lower extremity bilateral upper extremities with normal range of motion, no deformities Skin: Multiple bruises in bilateral upper extremities, skin is warm and dry without any rash  LABS:  BMET Recent Labs  Lab 04/25/17 0356 04/26/17 0513 04/27/17 0414  NA 134* 137 140  K 4.0 4.1 3.8  CL 100* 102 106  CO2 22 20* 21*  BUN 43* 57* 73*  CREATININE 1.34* 1.56* 1.72*  GLUCOSE 201* 209* 197*    Electrolytes Recent Labs  Lab 04/25/17 0356 04/26/17 0513 04/27/17 0414  CALCIUM 8.5* 8.4* 8.1*  MG  --  2.0  --   PHOS  --  5.9*  --     CBC Recent Labs  Lab 04/24/17 0347 04/26/17 0513 04/27/17 0414  WBC 9.1 12.7* 9.4  HGB 8.6* 8.3* 8.1*  HCT 25.9* 25.6* 24.2*  PLT 111* 119* 89*    Coag's No results for  input(s): APTT, INR in the last 168 hours.  Sepsis Markers Recent Labs  Lab 04/24/17 1040 04/26/17 0513  LATICACIDVEN  --  1.2  PROCALCITON 0.64  --     ABG Recent Labs  Lab 04/25/17 1130  PHART 7.39  PCO2ART 39  PO2ART 51*    Liver Enzymes Recent Labs  Lab 04/26/17 0513 04/27/17 0414  AST 80* 71*  ALT 42 51  ALKPHOS 114 101  BILITOT 2.0* 1.8*  ALBUMIN 1.9* 1.9*    Cardiac Enzymes Recent Labs  Lab 04/09/2017 1018  TROPONINI <0.03    Glucose Recent Labs  Lab 04/27/17 1155 04/27/17 1553 04/27/17 1946 04/27/17 2341 04/28/17 0352 04/28/17 0720  GLUCAP 218* 207* 225* 250* 241* 229*    Imaging No results found.  STUDIES:  ECHO 04/25/2017 LV EF: 30% -   35%  CULTURES: Influenza swab  ANTIBIOTICS: Vancomycin 3/3> Cefepime 3/3>  SIGNIFICANT EVENTS: 02/28>L-Hip arthroplasty 03/03>ICU with respiratory failure  LINES/TUBES: PIVs  DISCUSSION: 62 year old female admitted with a left hip fracture from a traumatic fall at home now presenting with acute respiratory failure secondary to pneumonia and pulmonary edema from CHF exacerbation-slwoly resolving  ASSESSMENT  Acute hypoxic respiratory failure-slowly resolving Acute encephalopathy secondary to hypoxemia-resolving Acute on chronic CHF exacerbation Acute on chronic COPD exacerbation Acute pulmonary edema Acute renal failure  Healthcare acquired pneumonia Left hip fracture status post left hemiarthroplasty PLAN Ok to transfer to gen med floor IV diuretics Nebulized bronchodilators IV and inhaled steroids GI and DVT prophylaxis Patient is a full code.      Corrin Parker, M.D.  [facility]  Medical Director [place] Director [facility] Cardio-Pulmonary Department

## 2017-04-28 NOTE — Progress Notes (Signed)
I was called by bedside nurse that patient has elevated HR into 140's Patient not in any distress, BP is stable and rhythm is Sinus Tachycardia Patient eating comfortably ascording to nurse. She has some exertional movement when this happened  I have advised that patient does NOT need any acute meds for Sinus Tach No need to transfer patient back to SD/ICU.  Recommend underlying therapy for pain and anxiety if needed    Corrin Parker, M.D.  [facility]  Medical Director [place] Director [facility] Cardio-Pulmonary Department

## 2017-04-28 NOTE — Progress Notes (Signed)
Pt's heart rate is 170's. Lopressor 5 mgm IV administered. BP is stable. Pt doesn't seem to be in any distress at this time. Administrative Coordinator notified of heart rate. Will continue to monitor pt

## 2017-04-28 NOTE — Progress Notes (Signed)
Inpatient Diabetes Program Recommendations  AACE/ADA: New Consensus Statement on Inpatient Glycemic Control (2015)  Target Ranges:  Prepandial:   less than 140 mg/dL      Peak postprandial:   less than 180 mg/dL (1-2 hours)      Critically ill patients:  140 - 180 mg/dL  Results for MARYTZA, GRANDPRE (MRN [id number]) as of 04/28/2017 09:37  Ref. Range 04/27/2017 07:41 04/27/2017 11:55 04/27/2017 15:53 04/27/2017 19:46 04/27/2017 23:41 04/28/2017 03:52 04/28/2017 07:20  Glucose-Capillary Latest Ref Range: 65 - 99 mg/dL 188 (H) 218 (H) 207 (H) 225 (H) 250 (H) 241 (H) 229 (H)    Review of Glycemic Control  Diabetes history: DM2 Outpatient Diabetes medications: Lantus 30 units daily Current orders for Inpatient glycemic control: Novolog 0-9 units Q4H; Solumedrol 40 mg Q12H  Inpatient Diabetes Program Recommendations:  Insulin - Basal: Please consider ordering Lantus 10 units Q24H (starting now). Correction (SSI): Please consider increasing Novolog correction to Moderate scale (0-15 units).  Thanks, Barnie Alderman, RN, MSN, CDE Diabetes Coordinator Inpatient Diabetes Program [phone] (Team Pager from 8am to 5pm)

## 2017-04-28 NOTE — Progress Notes (Signed)
Patient sustaining heart rate in 160s. Pt asymptomatic and not doing strenuous activity at this time.

## 2017-04-28 NOTE — Progress Notes (Signed)
Daily Progress Note   Patient Name: Samantha Mcmillan       Date: 04/28/2017 DOB: 05-Jan-1956  Age: 62 y.o. MRN#: [id number] Attending Physician: Gorden Harms, MD Primary Care Physician: Casilda Carls, MD Admit Date: 03/27/2017  Reason for Consultation/Follow-up: Establishing goals of care  Subjective: Patient resting in bed on nasal cannula. Son at bedside. She states she is feeling better, and breating better. She is hungry and wanting food.    Length of Stay: 8  Current Medications: Scheduled Meds:  . budesonide (PULMICORT) nebulizer solution  0.25 mg Nebulization Q12H  . enoxaparin (LOVENOX) injection  40 mg Subcutaneous Q24H  . Influenza vac split quadrivalent PF  0.5 mL Intramuscular Tomorrow-1000  . insulin aspart  0-9 Units Subcutaneous Q4H  . ipratropium-albuterol  3 mL Nebulization Q4H  . lactulose  300 mL Rectal Daily  . mouth rinse  15 mL Mouth Rinse BID  . methylPREDNISolone (SOLU-MEDROL) injection  40 mg Intravenous Q12H  . nicotine  14 mg Transdermal Daily  . pantoprazole (PROTONIX) IV  40 mg Intravenous Q24H    Continuous Infusions: . sodium chloride 40 mL/hr at 04/26/17 1500  . ceFEPime (MAXIPIME) IV 2 g (04/28/17 0942)  . methocarbamol (ROBAXIN)  IV      PRN Meds: acetaminophen **OR** acetaminophen, albuterol, alum & mag hydroxide-simeth, bisacodyl, magnesium citrate, methocarbamol **OR** methocarbamol (ROBAXIN)  IV, metoprolol tartrate, morphine injection, ondansetron **OR** ondansetron (ZOFRAN) IV, polyethylene glycol, senna-docusate  Physical Exam  Constitutional: No distress.  Pulmonary/Chest:  [place]  Skin: Skin is warm and dry.            Vital Signs: BP 101/64   Pulse (!) 107   Temp 98.4 F (36.9 C) (Oral)   Resp (!) 22   Ht 5\' 4"  (1.626 m)    Wt 71 kg (156 lb 8.4 oz)   LMP  (LMP Unknown)   SpO2 97%   BMI 26.87 kg/m  SpO2: SpO2: 97 % O2 Device: O2 Device: Nasal Cannula O2 Flow Rate: O2 Flow Rate (L/min): 4 L/min  Intake/output summary:   Intake/Output Summary (Last 24 hours) at 04/28/2017 1054 Last data filed at 04/28/2017 0600 Gross per 24 hour  Intake 1240 ml  Output 1160 ml  Net 80 ml   LBM: Last BM Date: 04/27/17 Baseline Weight: Weight: 70.3 kg (155  lb) Most recent weight: Weight: 71 kg (156 lb 8.4 oz)       Palliative Assessment/Data: 40%    Flowsheet Rows     Most Recent Value  Intake Tab  Referral Department  Hospitalist  Unit at Time of Referral  Med/Surg Unit  Palliative Care Primary Diagnosis  Pulmonary  Date Notified  04/25/17  Palliative Care Type  New Palliative care  Reason for referral  Clarify Goals of Care  Date of Admission  03/30/2017  Date first seen by Palliative Care  04/26/17  # of days Palliative referral response time  1 Day(s)  # of days IP prior to Palliative referral  5  Clinical Assessment  Psychosocial & Spiritual Assessment  Palliative Care Outcomes      Patient Active Problem List   Diagnosis Date Noted  . Acute respiratory failure with hypoxia ([place])   . Acute pulmonary edema (HCC)   . Hip fracture ([place]) 03/30/2017  . History of colonic polyps   . Benign neoplasm of transverse colon   . Benign neoplasm of descending colon   . Gastritis without bleeding   . Venous stasis 08/10/2016  . Hypokalemia 12/27/2015  . Acute on chronic diastolic heart failure ([place]) 12/27/2015  . Abdominal pain 11/13/2015  . Chronic pain 11/13/2015  . Closed nondisplaced fracture of seventh cervical vertebra with routine healing 11/13/2015  . Essential hypertension 11/13/2015  . Gallstone pancreatitis 11/13/2015  . Jaundice 11/13/2015  . Closed nondisplaced fracture of seventh cervical vertebra ([place]) 11/01/2015  . ARF (acute renal failure) ([place]) 07/06/2015  . Hyperkalemia 07/06/2015  .  Dehydration 07/06/2015  . Personal history of tobacco use, presenting hazards to health 06/21/2015  . Bilateral carotid artery stenosis 06/04/2015  . Mixed hyperlipidemia 06/04/2015  . Fall 04/16/2015  . Bacterial skin infection of leg 10/24/2014  . Edema leg 10/08/2014  . Hypertensive pulmonary vascular disease ([place]) 10/08/2014  . Mixed obsessional thoughts and acts 09/27/2014  . Chronic diastolic heart failure ([place]) 09/21/2014  . Tobacco use 09/21/2014  . Thrombocytopenia ([place]) 09/19/2014  . Leukopenia 09/19/2014  . Stasis dermatitis of left lower extremity with venous ulcer due to chronic peripheral venous hypertension ([place]) 08/15/2014  . IDA (iron deficiency anemia) 08/14/2014  . Ascites 07/25/2014  . Cellulitis and abscess of lower extremity 07/07/2014  . Encephalopathy, hepatic ([place]) 05/18/2014  . Polypharmacy 05/18/2014  . Failure to thrive in adult 05/16/2014  . Hepatic cirrhosis ([place]) 05/16/2014  . Protein-calorie malnutrition, severe ([place]) 05/16/2014  . COPD with acute exacerbation ([place]) 05/16/2014  . Acute on chronic diastolic CHF (congestive heart failure) ([place]) 05/16/2014  . Chronic back pain 05/16/2014  . Weakness 05/15/2014  . Hyponatremia 05/15/2014  . HCAP (healthcare-associated pneumonia) 05/09/2014  . Urinary retention 05/09/2014  . DM2 (diabetes mellitus, type 2) ([place]) 05/09/2014  . Hypothyroidism 05/05/2014  . Cervical stenosis of spine 03/11/2013  . Major depressive disorder, recurrent episode, severe ([place]) 12/05/2012  . Posttraumatic stress disorder 12/05/2012    Palliative Care Assessment & Plan   Patient Profile: Samantha Mcmillan y.o.femalewith a known history of chronic diastolic heart failure, cirrhosis of liver, bronchial asthma, COPD, GERD, hyperlipidemia, hypertension, irritable bowel syndrome, iron deficiency anemia, type 2 diabetes mellitus presented to the emergency room for fall.    Assessment/Recommendations/Plan:   Continue  aggressive care as indicated.   Recommend outpatient palliative.    Code Status:    Code Status Orders  (From admission, onward)        Start     Ordered  04/20/2017 0009  Full code  Continuous     04/10/2017 0008    Code Status History    Date Active Date Inactive Code Status Order ID Comments User Context   12/27/2015 23:03 12/30/2015 17:05 Full Code [id number]  Lance Coon, MD Inpatient   07/06/2015 19:57 07/09/2015 17:14 Full Code [id number]  Idelle Crouch, MD Inpatient   04/16/2015 06:01 04/17/2015 17:27 Full Code [id number]  Saundra Shelling, MD Inpatient   09/06/2014 16:01 09/08/2014 14:39 Full Code [id number]  Demetrios Loll, MD Inpatient   08/26/2014 22:01 08/29/2014 15:33 Full Code [id number]  Lytle Butte, MD ED   07/07/2014 01:38 07/09/2014 15:57 Full Code [id number]  Juluis Mire, MD Inpatient   05/15/2014 22:45 05/18/2014 18:07 Full Code [id number]  Allyne Gee, MD Inpatient   05/04/2014 16:03 05/09/2014 16:55 Full Code [id number]  Rigoberto Noel, MD ED    Advance Directive Documentation     Most Recent Value  Type of Advance Directive  Healthcare Power of Attorney, Living will  Pre-existing out of facility DNR order (yellow form or pink MOST form)  No data  "MOST" Form in Place?  No data       Prognosis:  Poor long term. Off BIPAP currently. Albumin low. COPD, PNA, cirrhosis. S/p hip fracture and repair.   Discharge Planning:  Family anticipating discharge to rehab.     Thank you for allowing the Palliative Medicine Team to assist in the care of this patient.   Total Time 15 min Prolonged Time Billed no      Greater than 50%  of this time was spent counseling and coordinating care related to the above assessment and plan.  Asencion Gowda, NP  Please contact Palliative Medicine Team phone at [phone] for questions and concerns.

## 2017-04-28 NOTE — Progress Notes (Signed)
[place] [facility] [place] NAME: Samantha Mcmillan    MR#:  [id number]  DATE OF BIRTH:  1955-04-08  SUBJECTIVE:  CHIEF COMPLAINT:   Chief Complaint  Patient presents with  . Hip Pain  . Fall  In discussion with intensivist, will transfer to the floor later today  REVIEW OF SYSTEMS:  CONSTITUTIONAL: No fever, fatigue or weakness.  EYES: No blurred or double vision.  EARS, NOSE, AND THROAT: No tinnitus or ear pain.  RESPIRATORY: No cough, shortness of breath, wheezing or hemoptysis.  CARDIOVASCULAR: No chest pain, orthopnea, edema.  GASTROINTESTINAL: No nausea, vomiting, diarrhea or abdominal pain.  GENITOURINARY: No dysuria, hematuria.  ENDOCRINE: No polyuria, nocturia,  HEMATOLOGY: No anemia, easy bruising or bleeding SKIN: No rash or lesion. MUSCULOSKELETAL: No joint pain or arthritis.   NEUROLOGIC: No tingling, numbness, weakness.  PSYCHIATRY: No anxiety or depression.   ROS  DRUG ALLERGIES:   Allergies  Allergen Reactions  . Iodine Shortness Of Breath  . Latex Anaphylaxis  . Other Rash, Other (See Comments), Shortness Of Breath, Anaphylaxis and Swelling    Pt states that she is allergic to Darvocet.   . Oxycodone Shortness Of Breath, Rash and Swelling    Other reaction(s): Other (See Comments) wheezing  . Oxycodone-Acetaminophen Other (See Comments), Rash, Swelling and Shortness Of Breath    wheezing wheezing  . Shellfish-Derived Products Anaphylaxis, Rash, Shortness Of Breath and Swelling    Throat closes.  . Amitriptyline Other (See Comments)    Other reaction(s): Other (See Comments) Mental changes Other reaction(s): Other (See Comments) Altered mental status, agitation Reaction:  Mental changes   . Fish-Derived Products Other (See Comments) and Swelling    Other reaction(s): Other (See Comments) "respiratory distress and wheezing" "respiratory distress and wheezing"  . Tape Other (See Comments)    Other reaction(s):  Other (See Comments) Pulls skin off Pt states that it pulls her skin off.  Pt states that it pulls her skin off.   . Wellbutrin [Bupropion] Other (See Comments)    Other reaction(s): Other (See Comments) Mental changes Reaction:  Mental changes   . Augmentin [Amoxicillin-Pot Clavulanate] Rash and Other (See Comments)    Has patient had a PCN reaction causing immediate rash, facial/tongue/throat swelling, SOB or lightheadedness with hypotension: No Has patient had a PCN reaction causing severe rash involving mucus membranes or skin necrosis: No Has patient had a PCN reaction that required hospitalization No Has patient had a PCN reaction occurring within the last 10 years: No If all of the above answers are "NO", then may proceed with Cephalosporin use.  . Codeine Rash  . Cortisone Rash  . Diphenhydramine Rash  . Hydrocodone-Acetaminophen Rash    dizzy  . Vicodin [Hydrocodone-Acetaminophen] Rash and Other (See Comments)    Reaction:  Dizziness      VITALS:  Blood pressure (!) 119/57, pulse (!) 193, temperature 98.3 F (36.8 C), temperature source Oral, resp. rate (!) 27, height 5\' 4"  (1.626 m), weight 71 kg (156 lb 8.4 oz), SpO2 (!) 89 %.  PHYSICAL EXAMINATION:  GENERAL:  62 y.o.-year-old patient lying in the bed with no acute distress.  EYES: Pupils equal, round, reactive to light and accommodation. No scleral icterus. Extraocular muscles intact.  HEENT: Head atraumatic, normocephalic. Oropharynx and nasopharynx clear.  NECK:  Supple, no jugular venous distention. No thyroid enlargement, no tenderness.  LUNGS: Normal breath sounds bilaterally, no wheezing, rales,rhonchi or crepitation. No use of accessory muscles of respiration.  CARDIOVASCULAR:  S1, S2 normal. No murmurs, rubs, or gallops.  ABDOMEN: Soft, nontender, nondistended. Bowel sounds present. No organomegaly or mass.  EXTREMITIES: No pedal edema, cyanosis, or clubbing.  NEUROLOGIC: Cranial nerves II through XII are  intact. Muscle strength 5/5 in all extremities. Sensation intact. Gait not checked.  PSYCHIATRIC: The patient is alert and oriented x 3.  SKIN: No obvious rash, lesion, or ulcer.   Physical Exam LABORATORY PANEL:   CBC Recent Labs  Lab 04/28/17 0753  WBC 11.2*  HGB 8.1*  HCT 26.2*  PLT 106*   ------------------------------------------------------------------------------------------------------------------  Chemistries  Recent Labs  Lab 04/26/17 0513 04/27/17 0414 04/28/17 0753  NA 137 140 143  K 4.1 3.8 3.6  CL 102 106 110  CO2 20* 21* 22  GLUCOSE 209* 197* 262*  BUN 57* 73* 80*  CREATININE 1.56* 1.72* 1.63*  CALCIUM 8.4* 8.1* 8.4*  MG 2.0  --   --   AST 80* 71*  --   ALT 42 51  --   ALKPHOS 114 101  --   BILITOT 2.0* 1.8*  --    ------------------------------------------------------------------------------------------------------------------  Cardiac Enzymes No results for input(s): TROPONINI in the last 168 hours. ------------------------------------------------------------------------------------------------------------------  RADIOLOGY:  No results found.  ASSESSMENT AND PLAN:  62 yo female w/ hx of DM, HTN, Hyperlipidemia, hx of Cirrhosis, Chronic Bronchitis, Anxiety, Anemia, GERD, Depression, PTSD, Chronic Diastolic CHF, hx of Panic Attack who presented to the hospital due to a fall and noted to have a Left Hip Fracture.   1. Acute hypoxic respiratory failure Resolving Secondary to bilateralHCAP,COPD,and CHFexacerbation  CT chest negative for PE, noted extensive bilateral groundglass opacities with broad differential, elevated pro-calcitonin level  Continue BTs, mucolytic agents,RTfollowing, oxygenwith weaning as tolerated, pulmonology following,blood gas noted for moderate hypoxia, echocardiogramnoted for ejection fraction 30-35%,Lasix, speech therapy recommending trials of nectar consistency liquids/pured foods for pleasure feedings with  nursing supervision, for transfer to regular nursing floor   2.Acute on chronic diastolic CHF Resolving Acute hypoxia noted this morning April 24, 2017 requiringtransfer to ICU for closer monitoring, BiPAP,chest x-ray noted for edema versus pneumonia, CT chest noted above Continue CHF protocol, Lasix,Aldactone, metoprolol,echocardiogram noted for ejection fraction 30-35%,  Echo from 2016with normal ejection fraction  3.AcutebilateralHCAP Resolving Chest x-ray, CT chest noted above  Continue pneumonia protocol, empiricCefepime/Vanco for 5-day course, and follow-up on cultures   4.Acute on COPD exacerbation Stable  ContinueIV Solu-Medrol with tapering as tolerated, inhaled corticosteroids, supplemental oxygenprn,mucolytic agents, and all other plans as stated above  5.S/p Fall and Left Hip Fracture s/pleft hip hemiarthroplastyby orthopedic surgery  6. Hxof gout Stable  Continue allopurinol  7.Hypertension,chronic Stable on current regiment  8.Chronic diabetes mellitus type 2 Stable on current regiment  9.Chronic tobacco smoking abuse/dependency Cessation counseling and nicotine patch daily  10.Noncompliant with medical management Stable The importance of compliance was encouraged  Long-term prognosis is poor-palliative care input appreciated  All the records are reviewed and case discussed with Care Management/Social Workerr. Management plans discussed with the patient, family and they are in agreement.  CODE STATUS: full  TOTAL TIME TAKING CARE OF THIS PATIENT: 35 minutes.     POSSIBLE D/C IN 2-5 DAYS, DEPENDING ON CLINICAL CONDITION.   Avel Peace Salary M.D on 04/28/2017   Between 7am to 6pm - Pager - [phone]  After 6pm go to [email or web address] - password [email or web address] [place] [facility]  Office  [phone]  CC: Primary care physician; Casilda Carls, MD  Note: This dictation was prepared with Dragon  dictation  along with smaller phrase technology. Any transcriptional errors that result from this process are unintentional.

## 2017-04-29 DIAGNOSIS — E440 Moderate protein-calorie malnutrition: Secondary | ICD-10-CM

## 2017-04-29 LAB — GLUCOSE, CAPILLARY
GLUCOSE-CAPILLARY: 470 mg/dL — AB (ref 65–99)
GLUCOSE-CAPILLARY: 309 mg/dL — AB (ref 65–99)
GLUCOSE-CAPILLARY: 30 mg/dL — AB (ref 65–99)
GLUCOSE-CAPILLARY: 75 mg/dL (ref 65–99)
Glucose-Capillary: 115 mg/dL — ABNORMAL HIGH (ref 65–99)
Glucose-Capillary: 342 mg/dL — ABNORMAL HIGH (ref 65–99)
Glucose-Capillary: 383 mg/dL — ABNORMAL HIGH (ref 65–99)

## 2017-04-29 LAB — CULTURE, BLOOD (ROUTINE X 2)
CULTURE: NO GROWTH
CULTURE: NO GROWTH
Special Requests: ADEQUATE
Special Requests: ADEQUATE

## 2017-04-29 MED ORDER — INSULIN GLARGINE 100 UNIT/ML ~~LOC~~ SOLN
25.0000 [IU] | Freq: Two times a day (BID) | SUBCUTANEOUS | Status: DC
  Administered 2017-04-29 – 2017-05-01 (×4): 25 [IU] via SUBCUTANEOUS
  Filled 2017-04-29 (×8): qty 0.25

## 2017-04-29 MED ORDER — INSULIN ASPART 100 UNIT/ML ~~LOC~~ SOLN
40.0000 [IU] | Freq: Once | SUBCUTANEOUS | Status: DC
  Filled 2017-04-29: qty 1

## 2017-04-29 MED ORDER — INSULIN ASPART 100 UNIT/ML ~~LOC~~ SOLN
40.0000 [IU] | Freq: Once | SUBCUTANEOUS | Status: AC
  Administered 2017-04-29: 40 [IU] via SUBCUTANEOUS
  Filled 2017-04-29: qty 1

## 2017-04-29 MED ORDER — DEXTROSE 50 % IV SOLN
INTRAVENOUS | Status: AC
  Administered 2017-04-29: 25 mL via INTRAVENOUS
  Filled 2017-04-29: qty 50

## 2017-04-29 MED ORDER — CARVEDILOL 6.25 MG PO TABS
6.2500 mg | ORAL_TABLET | Freq: Two times a day (BID) | ORAL | Status: DC
  Administered 2017-04-29: 6.25 mg via ORAL
  Filled 2017-04-29: qty 2

## 2017-04-29 MED ORDER — LEVALBUTEROL HCL 0.63 MG/3ML IN NEBU
INHALATION_SOLUTION | RESPIRATORY_TRACT | Status: AC
  Filled 2017-04-29: qty 3

## 2017-04-29 MED ORDER — DEXTROSE 50 % IV SOLN
25.0000 mL | Freq: Once | INTRAVENOUS | Status: AC
  Administered 2017-04-29: 25 mL via INTRAVENOUS

## 2017-04-29 MED ORDER — SPIRONOLACTONE 25 MG PO TABS
25.0000 mg | ORAL_TABLET | Freq: Every day | ORAL | Status: DC
  Administered 2017-05-01 – 2017-05-04 (×4): 25 mg via ORAL
  Filled 2017-04-29 (×4): qty 1

## 2017-04-29 MED ORDER — ISOSORBIDE MONONITRATE ER 30 MG PO TB24: 30.0000 mg | ORAL_TABLET | Freq: Every day | ORAL | Status: DC

## 2017-04-29 NOTE — Progress Notes (Signed)
Per [facility] admissions coordinator at [facility] authorization has been received.   [employer], LCSW [phone]

## 2017-04-29 NOTE — Evaluation (Signed)
Physical Therapy Evaluation Patient Details Name: Samantha Mcmillan MRN: [id number] DOB: 29-Aug-1955 Today's Date: 04/29/2017   History of Present Illness  Samantha Mcmillan is a 62yo ill appearing white female who comes to [facility] on 2/26 after a fall at home with subsequent Left hip fracture. Pt underwent Left HHA, with difficulty mobilizing, then later declined with pulmonary edema and moved to SDU. Pt now back on ortho floor with new orders to re-evaluate and continue PT.   Clinical Impression  Pt admitted with above diagnosis. Pt currently with functional limitations due to the deficits listed below (see "PT Problem List"). Upon entry, the patient is received semirecumbent in bed, several family members in room who report patient is excited to work with PT once again, in spite of lethargy. The pt is awake and agreeable to participate. No acute distress noted at this time. The pt is alert and oriented x3, pleasant, conversational (requires additional response time, hypophonic, and mild slurred speech), and following simple commands consistently. Pt received on 4LPM O2 unable to establish SpO2, moved to 5LPM for bed level exercises at which point SpO2:79% without recovery. Pt moved to 6L/min c gradual improvement to SpO2: 89% over the course of 2 minutes. RN immediately notified and telemetry battery replaced. Pt demonstrating good progress in LLE strength for A/ROM performance given post-operative timeline; denies pain related to [facility]. Session ended d/t hypoxia, and RN in room. Pt will benefit from skilled PT intervention to increase independence and safety with basic mobility in preparation for discharge to the venue listed below.       Follow Up Recommendations SNF    Equipment Recommendations  None recommended by PT    Recommendations for Other Services       Precautions / Restrictions Precautions Precautions: Fall;Posterior Hip Restrictions LLE Weight Bearing: Weight bearing as tolerated       Mobility  Bed Mobility Overal bed mobility: (not pursued at this time due to lethargy adn saturations)                Transfers                    Ambulation/Gait                Stairs            Wheelchair Mobility    Modified Rankin (Stroke Patients Only)       Balance                                             Pertinent Vitals/Pain Pain Assessment: No/denies pain    Home Living Family/patient expects to be discharged to:: Skilled nursing facility                      Prior Function Level of Independence: Independent with assistive device(s)         Comments: per pt report, she was lethargic and inconsistent with conversing     Hand Dominance        Extremity/Trunk Assessment        Lower Extremity Assessment Lower Extremity Assessment: LLE deficits/detail LLE Deficits / Details: minimal pain with bed exercises, strengtht is realtively good given surgical timeline, 4-/5.        Communication      Cognition Arousal/Alertness: Awake/alert;Lethargic Behavior During Therapy: Flat affect  General Comments: Pt lethargic, intermittent eyes open. SLurred speech .       General Comments      Exercises Total Joint Exercises Short Arc Quad: AROM;Supine;Left;10 reps Heel Slides: AAROM;10 reps;Supine;Left Hip ABduction/ADduction: 15 reps;AROM;AAROM;Supine;Left   Assessment/Plan    PT Assessment Patient needs continued PT services  PT Problem List Decreased strength;Decreased activity tolerance;Decreased range of motion;Decreased balance;Decreased mobility;Decreased coordination;Decreased cognition;Decreased knowledge of use of DME;Decreased safety awareness;Decreased knowledge of precautions;Pain       PT Treatment Interventions DME instruction;Gait training;Stair training;Functional mobility training;Therapeutic activities;Therapeutic  exercise;Balance training;Patient/family education    PT Goals (Current goals can be found in the Care Plan section)  Acute Rehab PT Goals Patient Stated Goal: improve strength and get to rehab to improve function  PT Goal Formulation: With patient Time For Goal Achievement: 05/13/17 Potential to Achieve Goals: Good    Frequency BID   Barriers to discharge        Co-evaluation               AM-PAC PT "6 Clicks" Daily Activity  Outcome Measure Difficulty turning over in bed (including adjusting bedclothes, sheets and blankets)?: A Lot Difficulty moving from lying on back to sitting on the side of the bed? : Unable Difficulty sitting down on and standing up from a chair with arms (e.g., wheelchair, bedside commode, etc,.)?: Unable Help needed moving to and from a bed to chair (including a wheelchair)?: A Lot Help needed walking in hospital room?: A Lot Help needed climbing 3-5 steps with a railing? : Total 6 Click Score: 9    End of Session Equipment Utilized During Treatment: Oxygen Activity Tolerance: Patient tolerated treatment well;Treatment limited secondary to medical complications (XIPJASN)([id number] declining ) Patient left: in bed;with bed alarm set;with call bell/phone within reach;with family/visitor present;with nursing/sitter in room Nurse Communication: Mobility status;Other (comment) PT Visit Diagnosis: Muscle weakness (generalized) (M62.81);Difficulty in walking, not elsewhere classified (R26.2)    Time: 7341-9379 PT Time Calculation (min) (ACUTE ONLY): 21 min   Charges:   PT Evaluation $PT Eval High Complexity: 1 High PT Treatments $Therapeutic Exercise: 8-22 mins   PT G Codes:        2:15 PM, 2017/05/16 Etta Grandchild, PT, DPT Physical Therapist - [facility]  [phone] ( [place])    [place] C May 16, 2017, 2:10 PM

## 2017-04-29 NOTE — Progress Notes (Signed)
PT Cancellation Note  Patient Details Name: Samantha Mcmillan MRN: [id number] DOB: 1955-11-29   Cancelled Treatment:    Reason Eval/Treat Not Completed: Medical issues which prohibited therapy(Most recent FBS in 400s. Will hold until within safe range. )  9:38 AM, 04/29/17 Etta Grandchild, PT, DPT Physical Therapist - [facility]  [phone] ([place])     [place] C 04/29/2017, 9:38 AM

## 2017-04-29 NOTE — Progress Notes (Signed)
* [facility]     Assessment and Plan:  Acute hypoxic respiratory failure complicated by pulmonary edema, possible pneumonitis, possible bronchiectasis and interstitial lung disease. Congestive heart failure with EF of 30%. Nicotine abuse. Cirrhosis/NASH with Grade 1 esophageal varices.  Deconditioning/Frailty at high risk of decompensation.  S/p left hemiarthroplasty 2/27   -Currently on steroids, can wean as tolerated. -Continue nebulized medications. --Diurese as tolerated, however the patients cirrhosis and AKI makes this difficult.  -Patient will need outpatient pulmonary follow-up with repeat CT chest to ensure resolution of pulmonary edema/pneumonitis changes.  Date: 04/29/2017  MRN# [id number] Samantha Mcmillan 03-08-1955   Samantha Mcmillan is a 62 y.o. old female seen in follow up for chief complaint of  Chief Complaint  Patient presents with  . Hip Pain  . Fall     HPI:  Patient is currently on 4 L nasal cannula, required 6L O2 with PT. No new complaints.   Imaging personally reviewed, 04/25/17, diffuse bilateral edema, possibly secondary to pneumonitis.  There appears to be severe underlying emphysematous changes  Cefepime 3/3>>  Medication:    Current Facility-Administered Medications:  .  0.9 %  sodium chloride infusion, , Intravenous, Continuous, Salary, Montell D, MD, Last Rate: 40 mL/hr at 04/29/17 0459 .  acetaminophen (TYLENOL) tablet 650 mg, 650 mg, Oral, Q6H PRN, 650 mg at 04/22/17 0402 **OR** acetaminophen (TYLENOL) suppository 650 mg, 650 mg, Rectal, Q6H PRN, Pyreddy, Pavan, MD .  alum & mag hydroxide-simeth (MAALOX/MYLANTA) 200-200-20 MG/5ML suspension 30 mL, 30 mL, Oral, Q4H PRN, Thornton Park, MD .  bisacodyl (DULCOLAX) suppository 10 mg, 10 mg, Rectal, Daily PRN, Thornton Park, MD, 10 mg at 04/24/17 1301 .  budesonide (PULMICORT) nebulizer solution 0.25 mg, 0.25 mg, Nebulization, Q12H, Tukov, Magadalene S, NP, 0.25 mg at  04/29/17 0823 .  carvedilol (COREG) tablet 6.25 mg, 6.25 mg, Oral, BID WC, Salary, Montell D, MD, 6.25 mg at 04/29/17 0849 .  ceFEPIme (MAXIPIME) 2 g in sodium chloride 0.9 % 100 mL IVPB, 2 g, Intravenous, Q12H, Salary, Montell D, MD, Stopped at 04/29/17 [phone] .  enoxaparin (LOVENOX) injection 40 mg, 40 mg, Subcutaneous, Q24H, Thornton Park, MD, 40 mg at 04/29/17 0840 .  furosemide (LASIX) tablet 40 mg, 40 mg, Oral, Daily, Flora Lipps, MD, 40 mg at 04/29/17 0839 .  Influenza vac split quadrivalent PF (FLUARIX) injection 0.5 mL, 0.5 mL, Intramuscular, Tomorrow-1000, Pyreddy, Pavan, MD .  insulin aspart (novoLOG) injection 0-9 Units, 0-9 Units, Subcutaneous, Q4H, Flora Lipps, MD, 7 Units at 04/29/17 0452 .  insulin glargine (LANTUS) injection 25 Units, 25 Units, Subcutaneous, BID, Salary, Montell D, MD, 25 Units at 04/29/17 0921 .  lactulose (CHRONULAC) 10 GM/15ML solution 30 g, 30 g, Oral, TID, Mortimer Fries, Kurian, MD, 30 g at 04/29/17 0840 .  levalbuterol (XOPENEX) nebulizer solution 1.25 mg, 1.25 mg, Nebulization, Q6H, Lance Coon, MD, 1.25 mg at 04/29/17 0823 .  magnesium citrate solution 1 Bottle, 1 Bottle, Oral, Once PRN, Thornton Park, MD .  MEDLINE mouth rinse, 15 mL, Mouth Rinse, BID, Salary, Montell D, MD, 15 mL at 04/28/17 2211 .  methocarbamol (ROBAXIN) tablet 500 mg, 500 mg, Oral, Q6H PRN, 500 mg at 04/22/17 0101 **OR** methocarbamol (ROBAXIN) 500 mg in dextrose 5 % 50 mL IVPB, 500 mg, Intravenous, Q6H PRN, Thornton Park, MD .  methylPREDNISolone sodium succinate (SOLU-MEDROL) 40 mg/mL injection 40 mg, 40 mg, Intravenous, Q12H, Kasa, Kurian, MD, 40 mg at 04/29/17 0451 .  metoprolol tartrate (LOPRESSOR) injection 2.5-5 mg, 2.5-5  mg, Intravenous, Q6H PRN, Awilda Bill, NP, 5 mg at 04/28/17 2033 .  morphine 2 MG/ML injection 1 mg, 1 mg, Intravenous, Q3H PRN, Salary, Montell D, MD, 1 mg at 04/26/17 1448 .  nicotine (NICODERM CQ - dosed in mg/24 hours) patch 14 mg, 14 mg, Transdermal,  Daily, Salary, Montell D, MD, 14 mg at 04/29/17 0840 .  ondansetron (ZOFRAN) tablet 4 mg, 4 mg, Oral, Q6H PRN **OR** ondansetron (ZOFRAN) injection 4 mg, 4 mg, Intravenous, Q6H PRN, Thornton Park, MD .  pantoprazole (PROTONIX) injection 40 mg, 40 mg, Intravenous, Q24H, Kasa, Kurian, MD, 40 mg at 04/28/17 1316 .  polyethylene glycol (MIRALAX / GLYCOLAX) packet 17 g, 17 g, Oral, Daily PRN, Thornton Park, MD .  senna-docusate (Senokot-S) tablet 1 tablet, 1 tablet, Oral, QHS PRN, Saundra Shelling, MD, 1 tablet at 04/23/17 2120 .  vitamin C (ASCORBIC ACID) tablet 500 mg, 500 mg, Oral, BID, Flora Lipps, MD, 500 mg at 04/29/17 0841   Allergies:  Iodine; Latex; Other; Oxycodone; Oxycodone-acetaminophen; Shellfish-derived products; Amitriptyline; Fish-derived products; Tape; Wellbutrin [bupropion]; Augmentin [amoxicillin-pot clavulanate]; Codeine; Cortisone; Diphenhydramine; Hydrocodone-acetaminophen; and Vicodin [hydrocodone-acetaminophen]  Review of Systems: Gen:  Denies  fever, sweats. HEENT: Denies blurred vision. Cvc:  No dizziness, chest pain or heaviness Resp:   Denies cough or sputum porduction. Gi: Denies swallowing difficulty, stomach pain. constipation, bowel incontinence Gu:  Denies bladder incontinence, burning urine Ext:   No Joint pain, stiffness. Skin: No skin rash, easy bruising. Endoc:  No polyuria, polydipsia. Psych: No depression, insomnia. Other:  All other systems were reviewed and found to be negative other than what is mentioned in the HPI.   Physical Examination:   VS: BP 122/71   Pulse (!) 113   Temp 98.7 F (37.1 C) (Oral)   Resp 20   Ht 5\' 4"  (1.626 m)   Wt 176 lb 2.4 oz (79.9 kg)   LMP  (LMP Unknown)   SpO2 93%   BMI 30.24 kg/m    General Appearance: No distress, pt appears frail.  Neuro:without focal findings,  speech normal,  HEENT: PERRLA, EOM intact. Pulmonary: normal breath sounds, No wheezing.   CardiovascularNormal S1,S2.  No m/r/g.     Abdomen: Benign, Soft, non-tender. Renal:  No costovertebral tenderness  GU:  Not performed at this time. Endoc: No evident thyromegaly, no signs of acromegaly. Skin:   warm, no rash. Extremities: normal, no cyanosis, clubbing.   LABORATORY PANEL:   CBC Recent Labs  Lab 04/28/17 0753  WBC 11.2*  HGB 8.1*  HCT 26.2*  PLT 106*   ------------------------------------------------------------------------------------------------------------------  Chemistries  Recent Labs  Lab 04/26/17 0513 04/27/17 0414 04/28/17 0753  NA 137 140 143  K 4.1 3.8 3.6  CL 102 106 110  CO2 20* 21* 22  GLUCOSE 209* 197* 262*  BUN 57* 73* 80*  CREATININE 1.56* 1.72* 1.63*  CALCIUM 8.4* 8.1* 8.4*  MG 2.0  --   --   AST 80* 71*  --   ALT 42 51  --   ALKPHOS 114 101  --   BILITOT 2.0* 1.8*  --    ------------------------------------------------------------------------------------------------------------------  Cardiac Enzymes No results for input(s): TROPONINI in the last 168 hours. ------------------------------------------------------------  RADIOLOGY:   No results found for this or any previous visit. Results for orders placed during the hospital encounter of 04/06/2017  DG Chest 2 View   Narrative CLINICAL DATA:  Hypoxia. Pt had total left hip arthroplasty 4 days ago. Hx - CHF, COPD, HTN, diabetes, PNA, cirrhosis,  on 2L oxygen at home, current smoker 0.5 ppd x 48 yrs.  EXAM: CHEST  2 VIEW  COMPARISON:  03/28/2017  FINDINGS: There are new bilateral patchy airspace opacities there are most prominent in the mid to lower lungs. Stable scarring is noted in the right upper lobe. There is chronic prominence of the bronchovascular markings stable from prior exams.  There is no pleural effusion.  No pneumothorax.  Cardiac silhouette is normal in size. No mediastinal or hilar masses.  IMPRESSION: 1. Patchy bilateral airspace lung opacities. This may reflect multifocal pneumonia or  asymmetric pulmonary edema.   Electronically Signed   By: Lajean Manes M.D.   On: 04/24/2017 09:42    ------------------------------------------------------------------------------------------------------------------  Thank  you for allowing [facility] to assist in the care of your patient. Our recommendations are noted above.  Please contact us if we can be of further service.   Marda Stalker, MD.  [facility] Office Number: [phone]  Patricia Pesa, M.D.  Merton Border, M.D  04/29/2017

## 2017-04-29 NOTE — Progress Notes (Signed)
MD salary was paged about fs [phone]. Order was for 40 units of regular insulin sq once and recheck in a few orders. No further orders at this time.

## 2017-04-29 NOTE — Consult Note (Signed)
[facility] Cardiology  CARDIOLOGY CONSULT NOTE  Patient ID: Samantha Mcmillan MRN: [id number] DOB/AGE: 1955-03-26 62 y.o.  Admit date: 04/08/2017 Referring Physician Salary Primary Physician [facility] Primary Cardiologist Nehemiah Massed Reason for Consultation cardiomyopathy  HPI: 62 year old female referred for evaluation of cardiomyopathy.  The patient has known history of chronic diastolic congestive heart failure, cirrhosis of the liver, and COPD.  She is admitted 04/15/2017 following left hip fracture, and underwent successful hemiarthroplasty on 04/20/2017.  Hospital course complicated by respiratory failure, multifactorial, secondary to pneumonia and CHF exacerbation, requiring ICU care.  Patient transferred to the floor on 04/28/2017.  2D echocardiogram, technically suboptimal with limited apical views, 04/25/2017 revealed LVEF of 30-35% with moderate tricuspid regurgitation.  Patient currently denies chest pain.  She does complain of shortness of breath.  Labs on 04/28/2017 revealed BUN and creatinine of 80 and 1.63, respectively.  Patient is anemic with hemoglobin hematocrit of 8.1 and 26.2, respectively.  Review of systems complete and found to be negative unless listed above     Past Medical History:  Diagnosis Date  . Acute on chronic diastolic CHF (congestive heart failure) ([place]) 05/16/2014  . Anemia   . Anxiety   . Arthritis    "some in my feet" (05/04/2014)  . Asthma   . Chronic bronchitis ([place])    "get it pretty much q yr"   . Chronic lower back pain   . Chronic respiratory failure ([place])   . Cirrhosis of liver ([place])   . Cirrhosis of liver ([place])   . Colon polyp   . COPD (chronic obstructive pulmonary disease) ([place])   . Depression   . Family history of adverse reaction to anesthesia    "my sister doesn't wake up good when she's put deep to sleep"  . GERD (gastroesophageal reflux disease)   . Headache    "more than a couple times/wk" (05/04/2014)  . Heart murmur   . High cholesterol    . History of blood transfusion    "related to my anemia"  . Hypertension   . IBS (irritable bowel syndrome)   . IDA (iron deficiency anemia) 08/14/2014  . Iron deficiency anemia   . OCD (obsessive compulsive disorder)   . On home oxygen therapy    "2L; 24/7" (05/04/2014)  . Panic attack   . Personal history of tobacco use, presenting hazards to health 06/21/2015  . Pneumonia "several times"  . PTSD (post-traumatic stress disorder)   . Sleep apnea    "can't tolerate CPAP" (05/04/2014)  . Type II diabetes mellitus ([place])     Past Surgical History:  Procedure Laterality Date  . BACK SURGERY    . CATARACT EXTRACTION W/ INTRAOCULAR LENS  IMPLANT, BILATERAL Bilateral 2005  . CESAREAN SECTION  1979  . COLONOSCOPY  2014  . COLONOSCOPY WITH PROPOFOL N/A 12/08/2016   Procedure: COLONOSCOPY WITH PROPOFOL;  Surgeon: Lucilla Lame, MD;  Location: [facility] ENDOSCOPY;  Service: Endoscopy;  Laterality: N/A;  . DILATION AND CURETTAGE OF UTERUS    . ESOPHAGOGASTRODUODENOSCOPY (EGD) WITH PROPOFOL N/A 12/08/2016   Procedure: ESOPHAGOGASTRODUODENOSCOPY (EGD) WITH PROPOFOL;  Surgeon: Lucilla Lame, MD;  Location: [facility] ENDOSCOPY;  Service: Endoscopy;  Laterality: N/A;  . HIP ARTHROPLASTY Left 03/28/2017   Procedure: ARTHROPLASTY BIPOLAR HIP (HEMIARTHROPLASTY);  Surgeon: Thornton Park, MD;  Location: [facility] ORS;  Service: Orthopedics;  Laterality: Left;  . INCISION AND DRAINAGE OF WOUND Right 2009   "leg mauled  by dog"  . PERIPHERAL VASCULAR CATHETERIZATION N/A 08/16/2014   Procedure: PICC Line Insertion;  Surgeon: Algernon Huxley, MD;  Location: [place] CV LAB;  Service: Cardiovascular;  Laterality: N/A;  . POSTERIOR FUSION CERVICAL SPINE  2015   "rebuilt 3 of my neck vertebrae"  . TUBAL LIGATION  1979  . UPPER GASTROINTESTINAL ENDOSCOPY      Medications Prior to Admission  Medication Sig Dispense Refill Last Dose  . albuterol (PROAIR HFA) 108 (90 Base) MCG/ACT inhaler ProAir HFA 90 mcg/actuation aerosol  inhaler   Not Taking  . allopurinol (ZYLOPRIM) 100 MG tablet Take 100 mg by mouth daily.    04/06/2017 at Unknown time  . ARIPiprazole (ABILIFY) 5 MG tablet Take 5 mg by mouth at bedtime.    03/27/2017 at Unknown time  . atorvastatin (LIPITOR) 20 MG tablet Take 20 mg by mouth daily.   04/18/2017 at Unknown time  . budesonide-formoterol (SYMBICORT) 80-4.5 MCG/ACT inhaler Inhale 2 puffs into the lungs 2 (two) times daily.    04/18/2017 at Unknown time  . bumetanide (BUMEX) 1 MG tablet Take 2 mg by mouth 2 (two) times daily.    04/12/2017 at Unknown time  . Calcium Carbonate-Vitamin D (CALTRATE 600+D PO) Take 1 tablet by mouth daily.   04/21/2017 at Unknown time  . ferrous fumarate-iron polysaccharide complex (TANDEM) 162-115.2 MG CAPS capsule Take 1 capsule by mouth 2 (two) times daily after a meal. 60 capsule 11 04/10/2017 at Unknown time  . HYDROmorphone (DILAUDID) 2 MG tablet Take 1 tablet (2 mg total) by mouth every 6 (six) hours as needed for severe pain. (Patient taking differently: Take 2 mg by mouth every 8 (eight) hours as needed for severe pain. ) 30 tablet 0 04/09/2017 at Unknown time  . insulin glargine (LANTUS) 100 UNIT/ML injection Inject 30 Units into the skin every morning.    04/12/2017 at Unknown time  . ipratropium-albuterol (DUONEB) 0.5-2.5 (3) MG/3ML SOLN Take 3 mLs by nebulization every 4 (four) hours as needed.   prn  . lamoTRIgine (LAMICTAL) 150 MG tablet Take 150 mg by mouth at bedtime.    04/19/2017 at Unknown time  . levothyroxine (SYNTHROID, LEVOTHROID) 88 MCG tablet Take 88 mcg by mouth daily before breakfast.    04/22/2017 at Unknown time  . linaclotide (LINZESS) 290 MCG CAPS capsule Take 1 capsule (290 mcg total) daily before breakfast by mouth. 30 capsule 6 03/31/2017 at Unknown time  . lisinopril (PRINIVIL,ZESTRIL) 20 MG tablet Take 20 mg by mouth daily.   03/30/2017 at Unknown time  . magnesium oxide (MAG-OX) 400 MG tablet Take 1 tablet by mouth 2 (two) times daily.    04/18/2017 at  Unknown time  . metolazone (ZAROXOLYN) 5 MG tablet Take 5 mg by mouth daily.   04/14/2017 at Unknown time  . metoprolol (LOPRESSOR) 50 MG tablet Take 50 mg by mouth 2 (two) times daily.   04/19/2017 at Unknown time  . Multiple Vitamin (MULTIVITAMIN WITH MINERALS) TABS tablet Take 1 tablet by mouth daily.   04/21/2017 at Unknown time  . pantoprazole (PROTONIX) 40 MG tablet Take 40 mg by mouth daily.   04/12/2017 at Unknown time  . sertraline (ZOLOFT) 100 MG tablet Take 200 mg by mouth daily.    04/07/2017 at Unknown time  . spironolactone (ALDACTONE) 25 MG tablet Take 25 mg by mouth daily.   04/16/2017 at Unknown time  . albuterol (PROVENTIL) (2.5 MG/3ML) 0.083% nebulizer solution Take 2.5 mg by nebulization every 6 (six) hours as needed for wheezing or shortness of breath.   Taking  . spironolactone (ALDACTONE) 50  MG tablet Take 1 tablet (50 mg total) by mouth daily. 30 tablet 11 Taking   Social History   Socioeconomic History  . Marital status: Divorced    Spouse name: Not on file  . Number of children: Not on file  . Years of education: Not on file  . Highest education level: Not on file  Social Needs  . Financial resource strain: Not on file  . Food insecurity - worry: Not on file  . Food insecurity - inability: Not on file  . Transportation needs - medical: Not on file  . Transportation needs - non-medical: Not on file  Occupational History  . Occupation: disabled  Tobacco Use  . Smoking status: Current Every Day Smoker    Packs/day: 0.50    Years: 48.00    Pack years: 24.00    Types: Cigarettes  . Smokeless tobacco: Never Used  Substance and Sexual Activity  . Alcohol use: No    Alcohol/week: 0.0 oz  . Drug use: No  . Sexual activity: Not Currently  Other Topics Concern  . Not on file  Social History Narrative  . Not on file    Family History  Problem Relation Age of Onset  . Cancer Father        pancreatic  . Cancer Brother   . Breast cancer Maternal Aunt        Review of systems complete and found to be negative unless listed above      PHYSICAL EXAM  General: Well developed, well nourished, in no acute distress HEENT:  Normocephalic and atramatic Neck:  No JVD.  Lungs: Clear bilaterally to auscultation and percussion. Heart: HRRR . Normal S1 and S2 without gallops or murmurs.  Abdomen: Bowel sounds are positive, abdomen soft and non-tender  Msk:  Back normal, normal gait. Normal strength and tone for age. Extremities: No clubbing, cyanosis or edema.   Neuro: Alert and oriented X 3. Psych:  Good affect, responds appropriately  Labs:   Lab Results  Component Value Date   WBC 11.2 (H) 04/28/2017   HGB 8.1 (L) 04/28/2017   HCT 26.2 (L) 04/28/2017   MCV 97.9 04/28/2017   PLT 106 (L) 04/28/2017    Recent Labs  Lab 04/27/17 0414 04/28/17 0753  NA 140 143  K 3.8 3.6  CL 106 110  CO2 21* 22  BUN 73* 80*  CREATININE 1.72* 1.63*  CALCIUM 8.1* 8.4*  PROT 5.2*  --   BILITOT 1.8*  --   ALKPHOS 101  --   ALT 51  --   AST 71*  --   GLUCOSE 197* 262*   Lab Results  Component Value Date   CKTOTAL 38 05/16/2014   CKMB 0.6 01/21/2014   TROPONINI <0.03 04/03/2017   No results found for: CHOL No results found for: HDL No results found for: LDLCALC No results found for: TRIG No results found for: CHOLHDL No results found for: LDLDIRECT    Radiology: Dg Chest 1 View  Result Date: 04/18/2017 CLINICAL DATA:  62 year old female with fall and left hip pain. Preop evaluation radiograph. EXAM: CHEST 1 VIEW COMPARISON:  Chest radiograph dated 12/27/2015 FINDINGS: The lungs are clear. There is no pleural effusion or pneumothorax. The cardiac silhouette is within normal limits. Partially visualized lower cervical fusion hardware. No acute osseous pathology. IMPRESSION: No active disease. Electronically Signed   By: Anner Crete M.D.   On: 03/29/2017 21:14   Dg Chest 2 View  Result Date: 04/24/2017 CLINICAL DATA:  Hypoxia. Pt had  total left hip arthroplasty 4 days ago. Hx - CHF, COPD, HTN, diabetes, PNA, cirrhosis, on 2L oxygen at home, current smoker 0.5 ppd x 48 yrs. EXAM: CHEST  2 VIEW COMPARISON:  03/28/2017 FINDINGS: There are new bilateral patchy airspace opacities there are most prominent in the mid to lower lungs. Stable scarring is noted in the right upper lobe. There is chronic prominence of the bronchovascular markings stable from prior exams. There is no pleural effusion.  No pneumothorax. Cardiac silhouette is normal in size. No mediastinal or hilar masses. IMPRESSION: 1. Patchy bilateral airspace lung opacities. This may reflect multifocal pneumonia or asymmetric pulmonary edema. Electronically Signed   By: Lajean Manes M.D.   On: 04/24/2017 09:42   Ct Head Wo Contrast  Result Date: 03/27/2017 CLINICAL DATA:  Fall. EXAM: CT HEAD WITHOUT CONTRAST CT CERVICAL SPINE WITHOUT CONTRAST TECHNIQUE: Multidetector CT imaging of the head and cervical spine was performed following the standard protocol without intravenous contrast. Multiplanar CT image reconstructions of the cervical spine were also generated. COMPARISON:  10/31/2015 FINDINGS: CT HEAD FINDINGS Brain: There is atrophy and chronic small vessel disease changes. No acute intracranial abnormality. Specifically, no hemorrhage, hydrocephalus, mass lesion, acute infarction, or significant intracranial injury. Vascular: No hyperdense vessel or unexpected calcification. Skull: No acute calvarial abnormality. Sinuses/Orbits: Visualized paranasal sinuses and mastoids clear. Orbital soft tissues unremarkable. Other: None CT CERVICAL SPINE FINDINGS Alignment: Normal Skull base and vertebrae: No fracture. Prior posterior fusion from C3-C6. 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 Compression fracture involving the C7 vertebral body is again noted as seen on prior study from 2017, slightly progressed in the degree of collapse. No acute fracture. Soft tissues and spinal canal: Prevertebral soft  tissues are normal. No epidural or paraspinal hematoma. Disc levels: Congenital fusion across C5-6. Remainder the disc spaces are maintained. Upper chest: Emphysema and biapical scarring. Other: Carotid artery calcifications.  No acute findings. IMPRESSION: No acute intracranial abnormality. Atrophy, chronic microvascular disease. 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 Posterior fusion changes from C3-C6. No acute bony abnormality. Old C7 compression fracture, progressed since 2017. Electronically Signed   By: Rolm Baptise M.D.   On: 04/18/2017 21:15   Ct Angio Chest Pe W Or Wo Contrast  Result Date: 04/25/2017 CLINICAL DATA:  Hypoxia. Abnormal chest radiograph. Evaluate for pulmonary embolism. EXAM: CT ANGIOGRAPHY CHEST WITH CONTRAST TECHNIQUE: Multidetector CT imaging of the chest was performed using the standard protocol during bolus administration of intravenous contrast. Multiplanar CT image reconstructions and MIPs were obtained to evaluate the vascular anatomy. CONTRAST:  18mL ISOVUE-370 IOPAMIDOL (ISOVUE-370) INJECTION 76% COMPARISON:  Chest radiograph - 04/24/2017; 03/27/2017; cervical spine CT - 04/08/2017; CT abdomen pelvis - 03/07/2016 FINDINGS: Vascular Findings: There is adequate opacification of the pulmonary arterial system with the main pulmonary artery measuring 448 Hounsfield units. There are no discrete filling defects within the pulmonary arterial tree to suggest pulmonary embolism. Normal caliber the main pulmonary scratch the borderline enlarged caliber the main pulmonary artery measuring 3.3 cm in diameter (image 38, series 4). Borderline cardiomegaly.  Coronary artery calcifications. Atherosclerotic plaque within the thoracic aorta. Conventional configuration of the aortic arch. The branch vessels of the aortic arch appear widely patent throughout their imaged course. Review of the MIP images confirms the above findings.  ---------------------------------------------------------------------------------- Nonvascular Findings: Mediastinum/Lymph Nodes: Mediastinal and hilar lymphadenopathy with index prevascular lymph node measuring 1.5 cm in greatest short axis diameter (image 32, series 4), index right-sided paratracheal lymph node measuring 1.5 cm (image 33), index subcarinal lymph  node measuring 1.9 cm (image 43), index right hilar lymph node measuring 1.3 cm (image 46) and index left hilar lymph node measuring 1.2 cm (image 42). No axillary lymphadenopathy. Lungs/Pleura: Extensive bilateral mid lung predominant ground-glass opacities with associated perihilar air bronchograms. Biapical centrilobular emphysematous change. No pleural effusion or pneumothorax. The central pulmonary airways appear widely patent. Upper abdomen: Early arterial phase evaluation of the upper abdomen demonstrates nodular hepatic contour compatible with hepatic cirrhosis. This finding is associated with splenomegaly (with the spleen measuring 14.5 cm in length and small amount of perihepatic ascites. Apparent thickening of the gastric fundus, potentially accentuated due to underdistention. There is diffuse thickening of the bilateral adrenal glands, incompletely evaluated. Musculoskeletal: Moderate (approximate 50%) compression deformity involving the superior endplate of C7, similar to neck CT performed 04/03/2017; moderate (approximately 40%) compression deformity involving the superior endplate of H84, similar to the abdominal CT performed 02/2016. Post paraspinal fusion of the lower cervical spine, incompletely evaluated. IMPRESSION: 1. Extensive bilateral ground-glass alveolar opacities with associated mediastinal and hilar lymphadenopathy - differential considerations are broad though favor multifocal infection (including atypical etiologies), with additional considerations including alveolar pulmonary edema, drug toxicity, inhalational injury and  (less likely) pulmonary hemorrhage. 2. No evidence of pulmonary embolism. 3. Stigmata of cirrhosis and portal venous hypertension including splenomegaly and small amount of perihepatic ascites within the incidentally imaged upper abdomen, similar to dedicated abdominal CT performed 02/2016. 4. Aortic Atherosclerosis (ICD10-I70.0) and Emphysema (ICD10-J43.9). Electronically Signed   By: Sandi Mariscal M.D.   On: 04/25/2017 10:01   Ct Cervical Spine Wo Contrast  Result Date: 04/16/2017 CLINICAL DATA:  Fall. EXAM: CT HEAD WITHOUT CONTRAST CT CERVICAL SPINE WITHOUT CONTRAST TECHNIQUE: Multidetector CT imaging of the head and cervical spine was performed following the standard protocol without intravenous contrast. Multiplanar CT image reconstructions of the cervical spine were also generated. COMPARISON:  10/31/2015 FINDINGS: CT HEAD FINDINGS Brain: There is atrophy and chronic small vessel disease changes. No acute intracranial abnormality. Specifically, no hemorrhage, hydrocephalus, mass lesion, acute infarction, or significant intracranial injury. Vascular: No hyperdense vessel or unexpected calcification. Skull: No acute calvarial abnormality. Sinuses/Orbits: Visualized paranasal sinuses and mastoids clear. Orbital soft tissues unremarkable. Other: None CT CERVICAL SPINE FINDINGS Alignment: Normal Skull base and vertebrae: No fracture. Prior posterior fusion from C3-C6. 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 Compression fracture involving the C7 vertebral body is again noted as seen on prior study from 2017, slightly progressed in the degree of collapse. No acute fracture. Soft tissues and spinal canal: Prevertebral soft tissues are normal. No epidural or paraspinal hematoma. Disc levels: Congenital fusion across C5-6. Remainder the disc spaces are maintained. Upper chest: Emphysema and biapical scarring. Other: Carotid artery calcifications.  No acute findings. IMPRESSION: No acute intracranial abnormality. Atrophy, chronic  microvascular disease. 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 Posterior fusion changes from C3-C6. No acute bony abnormality. Old C7 compression fracture, progressed since 2017. Electronically Signed   By: Rolm Baptise M.D.   On: 04/10/2017 21:15   Dg Chest Port 1 View  Result Date: 04/28/2017 CLINICAL DATA:  Cough EXAM: PORTABLE CHEST 1 VIEW COMPARISON:  04/26/2017, 04/24/2017, 12/27/2015 FINDINGS: Diffuse bilateral interstitial and ground-glass opacity, similar compared to prior. No pleural effusion. Stable enlarged cardiomediastinal silhouette with aortic atherosclerosis. No pneumothorax. Gas-filled enlarged bowel in the upper abdomen. IMPRESSION: No significant interval change in diffuse interstitial and ground-glass opacities, suspect for edema or diffuse pneumonia superimposed on chronic change. Cardiomegaly. Electronically Signed   By: Donavan Foil M.D.   On: 04/28/2017 22:16  Dg Chest Port 1 View  Result Date: 04/26/2017 CLINICAL DATA:  Pneumonia. EXAM: PORTABLE CHEST 1 VIEW COMPARISON:  Chest radiograph 04/24/2017 and CT 04/25/2017 FINDINGS: The cardiomediastinal silhouette is unchanged. Aortic atherosclerosis is noted. There are persistent extensive interstitial and patchy airspace opacities throughout both lungs, not significantly changed. No sizable pleural effusion or pneumothorax is identified. IMPRESSION: Persistent diffuse bilateral lung opacities which may reflect pneumonia. Electronically Signed   By: Logan Bores M.D.   On: 04/26/2017 07:07   Dg Hip Port Unilat With Pelvis 1v Left  Result Date: 04/18/2017 CLINICAL DATA:  Status post total hip replacement EXAM: DG HIP (WITH OR WITHOUT PELVIS) 2V PORT LEFT COMPARISON:  April 20, 2017 FINDINGS: Frontal pelvis as well as lateral left hip images were obtained. There is a total hip replacement on the left with prosthetic components well-seated. No acute fracture or dislocation currently. There is moderate narrowing of the right hip joint. Bones are  osteoporotic. There is aortoiliac atherosclerosis. There is common and superficial femoral artery calcification bilaterally. There is postoperative change on the left. IMPRESSION: Total hip replacement on the left with prosthetic component well-seated. No acute fracture or dislocation. Moderate narrowing right hip joint. Bones osteoporotic.  Aortoiliac and femoral artery atherosclerosis. Aortic Atherosclerosis (ICD10-I70.0). Electronically Signed   By: Lowella Grip III M.D.   On: 03/29/2017 18:14   Dg Hip Unilat W Or Wo Pelvis 2-3 Views Left  Result Date: 03/29/2017 CLINICAL DATA:  The patient suffered a fall today. Left hip pain. Initial encounter. EXAM: DG HIP (WITH OR WITHOUT PELVIS) 2-3V LEFT COMPARISON:  None. FINDINGS: The patient has an acute subcapital fracture of the left hip. No other acute abnormality is identified. IMPRESSION: Acute subcapital fracture left hip. Electronically Signed   By: Inge Rise M.D.   On: 04/13/2017 21:11    EKG: Normal sinus rhythm with nonspecific T wave abnormalities  ASSESSMENT AND PLAN:   1.  Moderate cardiomyopathy, of uncertain etiology 2.  Respiratory failure, multifactorial, secondary to pneumonia and CHF exacerbation 3.  Status post left hip fracture and hemiarthroplasty 4.  Chronic kidney disease 5.  Cirrhosis of the liver  Recommendations  1.  Agree with overall current therapy 2.  Continue carvedilol 6.25 mg p.o. twice daily 3.  Start isosorbide mononitrate 30 mg daily 4.  Start spironolactone 25 mg daily 5.  Defer starting ACE inhibitor or ARB at this time 6.  Close cardiology  follow-up as outpatient  Signed: Isaias Cowman MD,PhD, [facility] 04/29/2017, 1:03 PM

## 2017-04-29 NOTE — Progress Notes (Signed)
[facility] [place] NAME: Samantha Mcmillan    MR#:  [id number]  DATE OF BIRTH:  1956/02/23  SUBJECTIVE:  CHIEF COMPLAINT:   Chief Complaint  Patient presents with  . Hip Pain  . Fall  Patient without complaint, the patient's son and family members at the bedside, blood sugar was elevated over 400 per nursing staff, audiology consulted for cardiomyopathy/echo report, rectal tube in place for loose stools, Foley in place, patient with poor p.o. intake, tenuous respiratory status, tachycardia, hypoxia, palliative care following, physical therapy to see  REVIEW OF SYSTEMS:  CONSTITUTIONAL: No fever, fatigue or weakness.  EYES: No blurred or double vision.  EARS, NOSE, AND THROAT: No tinnitus or ear pain.  RESPIRATORY: No cough, shortness of breath, wheezing or hemoptysis.  CARDIOVASCULAR: No chest pain, orthopnea, edema.  GASTROINTESTINAL: No nausea, vomiting, diarrhea or abdominal pain.  GENITOURINARY: No dysuria, hematuria.  ENDOCRINE: No polyuria, nocturia,  HEMATOLOGY: No anemia, easy bruising or bleeding SKIN: No rash or lesion. MUSCULOSKELETAL: No joint pain or arthritis.   NEUROLOGIC: No tingling, numbness, weakness.  PSYCHIATRY: No anxiety or depression.   ROS  DRUG ALLERGIES:   Allergies  Allergen Reactions  . Iodine Shortness Of Breath  . Latex Anaphylaxis  . Other Rash, Other (See Comments), Shortness Of Breath, Anaphylaxis and Swelling    Pt states that she is allergic to Darvocet.   . Oxycodone Shortness Of Breath, Rash and Swelling    Other reaction(s): Other (See Comments) wheezing  . Oxycodone-Acetaminophen Other (See Comments), Rash, Swelling and Shortness Of Breath    wheezing wheezing  . Shellfish-Derived Products Anaphylaxis, Rash, Shortness Of Breath and Swelling    Throat closes.  . Amitriptyline Other (See Comments)    Other reaction(s): Other (See Comments) Mental changes Other reaction(s): Other (See  Comments) Altered mental status, agitation Reaction:  Mental changes   . Fish-Derived Products Other (See Comments) and Swelling    Other reaction(s): Other (See Comments) "respiratory distress and wheezing" "respiratory distress and wheezing"  . Tape Other (See Comments)    Other reaction(s): Other (See Comments) Pulls skin off Pt states that it pulls her skin off.  Pt states that it pulls her skin off.   . Wellbutrin [Bupropion] Other (See Comments)    Other reaction(s): Other (See Comments) Mental changes Reaction:  Mental changes   . Augmentin [Amoxicillin-Pot Clavulanate] Rash and Other (See Comments)    Has patient had a PCN reaction causing immediate rash, facial/tongue/throat swelling, SOB or lightheadedness with hypotension: No Has patient had a PCN reaction causing severe rash involving mucus membranes or skin necrosis: No Has patient had a PCN reaction that required hospitalization No Has patient had a PCN reaction occurring within the last 10 years: No If all of the above answers are "NO", then may proceed with Cephalosporin use.  . Codeine Rash  . Cortisone Rash  . Diphenhydramine Rash  . Hydrocodone-Acetaminophen Rash    dizzy  . Vicodin [Hydrocodone-Acetaminophen] Rash and Other (See Comments)    Reaction:  Dizziness      VITALS:  Blood pressure (!) 105/51, pulse 85, temperature (!) 96 F (35.6 C), temperature source Axillary, resp. rate 20, height 5\' 4"  (1.626 m), weight 79.9 kg (176 lb 2.4 oz), SpO2 90 %.  PHYSICAL EXAMINATION:  GENERAL:  62 y.o.-year-old patient lying in the bed with no acute distress.  EYES: Pupils equal, round, reactive to light and accommodation. No scleral icterus. Extraocular muscles intact.  HEENT:  Head atraumatic, normocephalic. Oropharynx and nasopharynx clear.  NECK:  Supple, no jugular venous distention. No thyroid enlargement, no tenderness.  LUNGS: Normal breath sounds bilaterally, no wheezing, rales,rhonchi or crepitation. No use  of accessory muscles of respiration.  CARDIOVASCULAR: S1, S2 normal. No murmurs, rubs, or gallops.  ABDOMEN: Soft, nontender, nondistended. Bowel sounds present. No organomegaly or mass.  EXTREMITIES: No pedal edema, cyanosis, or clubbing.  NEUROLOGIC: Cranial nerves II through XII are intact. Muscle strength 5/5 in all extremities. Sensation intact. Gait not checked.  PSYCHIATRIC: The patient is alert and oriented x 3.  SKIN: No obvious rash, lesion, or ulcer.   Physical Exam LABORATORY PANEL:   CBC Recent Labs  Lab 04/28/17 0753  WBC 11.2*  HGB 8.1*  HCT 26.2*  PLT 106*   ------------------------------------------------------------------------------------------------------------------  Chemistries  Recent Labs  Lab 04/26/17 0513 04/27/17 0414 04/28/17 0753  NA 137 140 143  K 4.1 3.8 3.6  CL 102 106 110  CO2 20* 21* 22  GLUCOSE 209* 197* 262*  BUN 57* 73* 80*  CREATININE 1.56* 1.72* 1.63*  CALCIUM 8.4* 8.1* 8.4*  MG 2.0  --   --   AST 80* 71*  --   ALT 42 51  --   ALKPHOS 114 101  --   BILITOT 2.0* 1.8*  --    ------------------------------------------------------------------------------------------------------------------  Cardiac Enzymes No results for input(s): TROPONINI in the last 168 hours. ------------------------------------------------------------------------------------------------------------------  RADIOLOGY:  Dg Chest Port 1 View  Result Date: 04/28/2017 CLINICAL DATA:  Cough EXAM: PORTABLE CHEST 1 VIEW COMPARISON:  04/26/2017, 04/24/2017, 12/27/2015 FINDINGS: Diffuse bilateral interstitial and ground-glass opacity, similar compared to prior. No pleural effusion. Stable enlarged cardiomediastinal silhouette with aortic atherosclerosis. No pneumothorax. Gas-filled enlarged bowel in the upper abdomen. IMPRESSION: No significant interval change in diffuse interstitial and ground-glass opacities, suspect for edema or diffuse pneumonia superimposed on  chronic change. Cardiomegaly. Electronically Signed   By: Donavan Foil M.D.   On: 04/28/2017 22:16    ASSESSMENT AND PLAN:  62 yo female w/ hx of DM, HTN, Hyperlipidemia, hx of Cirrhosis, Chronic Bronchitis, Anxiety, Anemia, GERD, Depression, PTSD, Chronic Diastolic CHF, hx of Panic Attack who presented to the hospital due to a fall and noted to have a Left Hip Fracture.   1. Acute hypoxic respiratory failure Stable, yet tenous Secondary to bilateralHCAP,COPD,and CHFexacerbation  CT chest negative for PE, noted extensive bilateral groundglass opacities with broad differential, elevated pro-calcitonin level  Continue BTs, mucolytic agents,RTfollowing, oxygenwith weaning as tolerated, pulmonologyfollowing,blood gas noted for moderate hypoxia, echocardiogramnoted for ejection fraction 30-35%, cardiology to see for cardiomyopathy, continueLasix, ST following- recommending trials of nectar consistency liquids/pured foods for pleasure feedings, dietary following, possible need for enteral versus parenteral nutrition?/PEG    2.Acute on chronic diastolic CHF Resolving slowly Acute hypoxia noted this morning April 24, 2017 requiringtransfer to ICU for closer monitoring, transferred back to regular nursing floor bed on April 29, 2010, weaned of BiPAP,chest x-ray noted for edema versus pneumonia/repeat was unchanged, CT chest noted ContinueCHFprotocol, Lasix,Aldactone, metoprolol,echocardiogram noted for ejection fraction 30-35%, with cardiology to see Echo from 2016with normal ejection fraction  3.AcutebilateralHCAP Resolving Chest x-ray, CT chest noted Continue pneumonia protocol, empiricCefepime for 5-7 day course  4.Acute on COPD exacerbation Stable  ContinueIV Solu-Medrol with tapering as tolerated, inhaled corticosteroids, supplemental oxygenprn,mucolytic agents  5.S/p Fall and Left Hip Fracture s/pleft hip hemiarthroplastyby orthopedic surgery  6.  Hxof gout Stable  Continue allopurinol  7.Hypertension,chronic Stable on current regiment  8.Chronic diabetes mellitus type 2 Stable  on current regiment  9.Chronic tobacco smoking abuse/dependency Cessation counseling and nicotine patch daily  10.Noncompliant with medical management Stable The importance of compliance was encouraged  Long-term prognosis is poor-palliative care input appreciated  All the records are reviewed and case discussed with Care Management/Social Workerr. Management plans discussed with the patient, family and they are in agreement.  CODE STATUS: full  TOTAL TIME TAKING CARE OF THIS PATIENT: 35 minutes.     POSSIBLE D/C IN 2-5 DAYS, DEPENDING ON CLINICAL CONDITION.   Avel Peace Salary M.D on 04/29/2017   Between 7am to 6pm - Pager - [phone]  After 6pm go to [email or web address] - password [email or web address] [facility]  Office  [phone]  CC: Primary care physician; Samantha Carls, MD  Note: This dictation was prepared with Dragon dictation along with smaller phrase technology. Any transcriptional errors that result from this process are unintentional.

## 2017-04-29 NOTE — Progress Notes (Signed)
PT oxygen saturation ranging between 85% to 93% on 4L O2.  Pt alert and oriented. No confusion noted. Pt stated that she was not having any distress. Appeared to be using her accessory muscles when breathing, but according to the patient and her daughter, who is at the bed site that her norm. MD notified. Order for continues pulse Oximeter obtained.

## 2017-04-29 NOTE — Progress Notes (Signed)
Pt didn't not eat dinner tonight due to lethargy.

## 2017-04-29 NOTE — Progress Notes (Signed)
  Subjective:  Patient requiring O2 by mask.  Patient resting.  Family at the bedside.  O2 requirement increased on the floor today and patient transferred back to ICU.  Objective:   VITALS:   Vitals:   04/29/17 1350 04/29/17 1355 04/29/17 1415 04/29/17 1700  BP:   103/62 112/68  Pulse: (!) 105 (!) 104  (!) 105  Resp:    20  Temp:      TempSrc:      SpO2: (!) 79% (!) 89% 94% 100%  Weight:      Height:        PHYSICAL EXAM: Left lower extremity: Intact pulses distally Dorsiflexion/Plantar flexion intact A better look at bandage today revealed moderate dried blood on bandage.   No cellulitis present Compartment soft  LABS  Results for orders placed or performed during the hospital encounter of 04/08/2017 (from the past 24 hour(s))  Glucose, capillary     Status: Abnormal   Collection Time: 04/28/17  9:28 PM  Result Value Ref Range   Glucose-Capillary 347 (H) 65 - 99 mg/dL   Comment 1 Notify RN   Glucose, capillary     Status: Abnormal   Collection Time: 04/29/17 12:25 AM  Result Value Ref Range   Glucose-Capillary 383 (H) 65 - 99 mg/dL  Glucose, capillary     Status: Abnormal   Collection Time: 04/29/17  4:29 AM  Result Value Ref Range   Glucose-Capillary 342 (H) 65 - 99 mg/dL  Glucose, capillary     Status: Abnormal   Collection Time: 04/29/17  7:39 AM  Result Value Ref Range   Glucose-Capillary 470 (H) 65 - 99 mg/dL   Comment 1 Notify RN   Glucose, capillary     Status: Abnormal   Collection Time: 04/29/17 11:36 AM  Result Value Ref Range   Glucose-Capillary 309 (H) 65 - 99 mg/dL   Comment 1 Notify RN   Glucose, capillary     Status: Abnormal   Collection Time: 04/29/17  4:24 PM  Result Value Ref Range   Glucose-Capillary 115 (H) 65 - 99 mg/dL    Dg Chest Port 1 View  Result Date: 04/28/2017 CLINICAL DATA:  Cough EXAM: PORTABLE CHEST 1 VIEW COMPARISON:  04/26/2017, 04/24/2017, 12/27/2015 FINDINGS: Diffuse bilateral interstitial and ground-glass opacity,  similar compared to prior. No pleural effusion. Stable enlarged cardiomediastinal silhouette with aortic atherosclerosis. No pneumothorax. Gas-filled enlarged bowel in the upper abdomen. IMPRESSION: No significant interval change in diffuse interstitial and ground-glass opacities, suspect for edema or diffuse pneumonia superimposed on chronic change. Cardiomegaly. Electronically Signed   By: Donavan Foil M.D.   On: 04/28/2017 22:16    Assessment/Plan: 8 Days Post-Op   Active Problems:   COPD with acute exacerbation (HCC)   Hip fracture (HCC)   Acute respiratory failure with hypoxia (HCC)   Acute pulmonary edema (HCC)   Malnutrition of moderate degree  Patient requiring supplemental O2.  Performed some limited PT today.  Restart PT when breathing improves.  Continue lovenox.  Will follow.    Thorn[place] , MD 04/29/2017, 6:17 PM

## 2017-04-29 NOTE — Progress Notes (Signed)
Report called to [employer]. Transport to ICU 14 with Tiffany Rn. Family was made aware. No further orders at this time.

## 2017-04-29 NOTE — Progress Notes (Signed)
Speech Language Pathology Treatment: Dysphagia  Patient Details Name: Samantha Mcmillan MRN: [id number] DOB: 02-Sep-1955 Today's Date: 04/29/2017 Time: 2831-5176 SLP Time Calculation (min) (ACUTE ONLY): 41 min  Assessment / Plan / Recommendation Clinical Impression  Pt seen for ongoing assessment of toleration of oral intake. A dysphagia diet level 1(puree) w/ NECTAR liquids was initiated yesterday post toleration of trial po's w/ NSG and SLP the PM prior. Pt continues to present w/ declined Pulmonary function/status and requires increased O2 support - at [facility] level currently.  Pt appears to present w/ oropharyngeal phase dysphagia c/b slow oral phase bolus management and increased risk for poor timing/maintaining sufficient airway closure during the swallow d/t declined Pulmonary status. Pt exhibited no immediate, overt s/s of aspiration w/ small bolus trials of NECTAR consistency liquids via TSP and cup(supported) and purees given but required frequent rest breaks to avoid increased HR/RR and respiratory effort. Pt is slow to follow through w/ po tasks d/t the exertion/fatigue of tasks and attention overall. These factors also increase risk for aspiration as well. Oral phase required min extra time for bolus transfer and oral clearing of consistencies given - suspect some impact of attempting to time the A-P transfer for swallowing b/t breathing. Pt helped to hold cup required cues.   Pt does present w/ increased risk for aspiration secondary to airway compromise during oral intake d/t the demand of declined respiratory status on her oropharyngeal phases of swallowing. As long as pt continues to appear to tolerate small/few trials of Nectar liquids via tsp/cup(NO Straws) and purees w/ NSG supervision and strict aspiration precautions, then recommend continue a Dysphagia 1 w/ Nectar liquids diet. Strict monitoring of medical/respiratory status' and use of aspiration precautions w/ all oral intake is  recommended; Pills CRUSHED in puree or in liquid from mixed in puree/Nectar liquid. There would not be any further dysphagia diet to be recommended if pt cannot maintain safe swallowing, or her nutritional needs, w/ this diet. Would recommend f/u w/ Palliative Care or MD for alternative means of feeding if appropriate. MD/NSG updated, agreed. Family and pt given explanation and education on above; aspiration precautions as well as the recommendation for NO ice chips at this time in order to maintain a cautious and conservative plan of care for pt's recovery. Son and Dtr agreed. NSG updated.    HPI HPI: Pt is a 62 y.o. female with a history of cirrhosis of the liver, anemia, CHF, COPD, anxiety/depression, GERD, IBS, IDA, OCD, DM, hypertension, on home O2, PTSD, sleep apnea but cannot won't wear CPAP, and tobacco use who presents for evaluation of left hip pain status post mechanical fall. Patient reports that she had just finished washing the dishes and was walking from the kitchen to the living room when her foot got caught and she fell onto the left hip. Pt had repair of the left femoral neck fracture on 04/20/2017. She has experienced hepatic encephalopathy in the setting of COPD and diastolic heart failure requiring increased O2 support and was transferred to CCU stepdown. Yesterday, she moved back onto the floor unit. She is on [place] O2 support. She is more alert to others, can engage verbally. Noted increased respiratory effort but stable - clavicular breathing.      SLP Plan  Continue with current plan of care       Recommendations  Diet recommendations: Dysphagia 1 (puree);Nectar-thick liquid Liquids provided via: Cup;Teaspoon;No straw Medication Administration: Crushed with puree(for safer, easier swallowing; liquid form mixed in puree) Supervision: Staff  to assist with self feeding;Full supervision/cueing for compensatory strategies Compensations: Minimize environmental distractions;Slow  rate;Small sips/bites;Lingual sweep for clearance of pocketing;Multiple dry swallows after each bite/sip;Follow solids with liquid(frequent rest breaks) Postural Changes and/or Swallow Maneuvers: Seated upright 90 degrees;Upright 30-60 min after meal                General recommendations: (dietician f/u for calorie count; palliative care f/u) Oral Care Recommendations: Oral care BID;Staff/trained caregiver to provide oral care Follow up Recommendations: Skilled Nursing facility SLP Visit Diagnosis: Dysphagia, oropharyngeal phase (R13.12) Plan: Continue with current plan of care       [place], [place], CCC-SLP Watson,Katherine 04/29/2017, 5:29 PM

## 2017-04-29 NOTE — Progress Notes (Signed)
MD Salary was called after pt placed on 7 L of [place] after working with pt. Respiratory therapist called. MD place order to transfer to stepdown.

## 2017-04-29 NOTE — Progress Notes (Signed)
Rounding with Dr. Jerelyn Charles. Listened to family and pt. Let them know we are available.   04/29/17 1000  Clinical Encounter Type  Visited With Patient and family together  Visit Type Follow-up  Referral From Physician  Spiritual Encounters  Spiritual Needs Emotional

## 2017-04-29 NOTE — Progress Notes (Signed)
Nutrition Follow-up  DOCUMENTATION CODES:   Non-severe (moderate) malnutrition in context of chronic illness  INTERVENTION:  Provide Vital Cuisine po TID with trays, each supplement provides 520 kcal and 22 grams of protein.  Provide Magic cup TID with meals, each supplement provides 290 kcal and 9 grams of protein.  Continue Vitamin C 500 mg BID.  Calorie count was initiated.  If patient is unable to meet calorie and protein needs from meals and oral nutrition supplements, the most appropriate form of nutrition support would be placement of 10 Fr. NGT with weighted Dobbhoff tip for tube feeding. These tubes are flexible and patient can continue to try to eat during the day with the tube in while nocturnal tube feeds can help meet her calorie and protein needs.  Patient is NOT a candidate for parenteral nutrition. Would not recommend placement of PEG tube at this time as that is indicated for long-term nutrition support. Patient is acutely ill and decreased intake is common in acutely/critically ill patients. It would be most appropriate to support her with an NGT in the short-term.  NUTRITION DIAGNOSIS:   Moderate Malnutrition related to chronic illness(cirrhosis, COPD) as evidenced by mild fat depletion, mild muscle depletion, moderate muscle depletion.  Ongoing - addressing with advancement of diet and oral nutrition supplements.  GOAL:   Patient will meet greater than or equal to 90% of their needs  Progressing as diet was started on 3/6.  MONITOR:   Diet advancement, Supplement acceptance, Labs, Weight trends, Skin, I & O's  REASON FOR ASSESSMENT:   Consult Assessment of nutrition requirement/status  ASSESSMENT:   62 year old female with PMHx of GERD, cirrhosis, HTN, depression, COPD, IBS, chronic bronchitis, DM type 2, iron deficiency anemia, anxiety, CHF, OCD, panic attacks, PTSD who presented after fall found to have left hip fracture now s/p left hemiarthroplasty  2/27, also having acute hypoxic respiratory failure from acute on chronic CHF exacerbation, acute exacerbation of COPD, pulmonary edema, and PNA.   -Rectal tube was placed 3/4.  Met with patient and family members at bedside. Patient sleeping at time of RD assessment. Her diet was just started in the afternoon of 3/6 so her first meal would have been dinner. Meal completion not recorded in chart. Family reports she is eating the pudding, Magic Cups, and thickened juices, milks, and V-8 juice from trays. Per RN she is tachycardic and having difficulty breathing.  Medications reviewed and include: Lasix 40 mg daily, Novolog 0-9 units Q4hrs, Lantus 25 units BID, lactulose 30 grams TID, Solu-Medrol 40 mg Q12hrs IV, pantoprazole, vitamin C 500 mg BID, NS @ 40 mL/hr, cefepime.  Labs reviewed: CBG 304-470 past 24 hrs. On 3/6 BUN 80, Creatinine 1.63.  I/O: 640 mL UOP yesterday (0.3 mL/kg/hr)  Weight trend: 79.9 kg on 3/7; +8.7 kg from admission likely in setting of fluid retention  Discussed with RN and SLP.  Diet Order:  Aspiration precautions DIET - DYS 1 Room service appropriate? Yes with Assist; Fluid consistency: Nectar Thick  EDUCATION NEEDS:   Not appropriate for education at this time  Skin:  Skin Assessment: Skin Integrity Issues: Skin Integrity Issues:: Incisions, Other (Comment) Incisions: closed incision left hip Other: MSAD to abdomen and groin; ecchymosis to bilateral arms and legs  Last BM:  04/28/2017 - type 7 in rectal tube  Height:   Ht Readings from Last 1 Encounters:  04/25/17 5' 4" (1.626 m)    Weight:   Wt Readings from Last 1 Encounters:  04/29/17  176 lb 2.4 oz (79.9 kg)    Ideal Body Weight:  54.5 kg  BMI:  Body mass index is 30.24 kg/m.  Estimated Nutritional Needs:   Kcal:  1550-1800 (MSJ x 1.2-1.4)  Protein:  75-90 grams (1-1.2 grams/kg)  Fluid:  1.5-1.8 L/day (1 mL/kcal)  Willey Blade, MS, RD, LDN Office: [phone] Pager:  [phone] After Hours/Weekend Pager: [phone]

## 2017-04-30 ENCOUNTER — Inpatient Hospital Stay: Payer: Medicare Other

## 2017-04-30 LAB — GLUCOSE, CAPILLARY
GLUCOSE-CAPILLARY: 106 mg/dL — AB (ref 65–99)
GLUCOSE-CAPILLARY: 121 mg/dL — AB (ref 65–99)
GLUCOSE-CAPILLARY: 121 mg/dL — AB (ref 65–99)
GLUCOSE-CAPILLARY: 169 mg/dL — AB (ref 65–99)
GLUCOSE-CAPILLARY: 57 mg/dL — AB (ref 65–99)
Glucose-Capillary: 124 mg/dL — ABNORMAL HIGH (ref 65–99)
Glucose-Capillary: 146 mg/dL — ABNORMAL HIGH (ref 65–99)
Glucose-Capillary: 69 mg/dL (ref 65–99)
Glucose-Capillary: 70 mg/dL (ref 65–99)

## 2017-04-30 LAB — BASIC METABOLIC PANEL
ANION GAP: 8 (ref 5–15)
BUN: 91 mg/dL — ABNORMAL HIGH (ref 6–20)
CALCIUM: 8.3 mg/dL — AB (ref 8.9–10.3)
CO2: 23 mmol/L (ref 22–32)
Chloride: 115 mmol/L — ABNORMAL HIGH (ref 101–111)
Creatinine, Ser: 2.08 mg/dL — ABNORMAL HIGH (ref 0.44–1.00)
GFR calc Af Amer: 28 mL/min — ABNORMAL LOW (ref >60)
GFR, EST NON AFRICAN AMERICAN: 24 mL/min — AB (ref >60)
GLUCOSE: 124 mg/dL — AB (ref 65–99)
Potassium: 4.2 mmol/L (ref 3.5–5.1)
Sodium: 146 mmol/L — ABNORMAL HIGH (ref 135–145)

## 2017-04-30 LAB — MAGNESIUM: Magnesium: 2.6 mg/dL — ABNORMAL HIGH (ref 1.7–2.4)

## 2017-04-30 LAB — AMMONIA: Ammonia: 52 umol/L — ABNORMAL HIGH (ref 9–35)

## 2017-04-30 LAB — PHOSPHORUS: Phosphorus: 5 mg/dL — ABNORMAL HIGH (ref 2.5–4.6)

## 2017-04-30 MED ORDER — SODIUM CHLORIDE 0.9 % IV SOLN
0.0000 ug/min | INTRAVENOUS | Status: DC
  Administered 2017-04-30: 100 ug/min via INTRAVENOUS
  Administered 2017-04-30 – 2017-05-01 (×2): 175 ug/min via INTRAVENOUS
  Administered 2017-05-01: 100 ug/min via INTRAVENOUS
  Administered 2017-05-01: 160 ug/min via INTRAVENOUS
  Administered 2017-05-01: 90 ug/min via INTRAVENOUS
  Administered 2017-05-01: 175 ug/min via INTRAVENOUS
  Administered 2017-05-02: 90 ug/min via INTRAVENOUS
  Administered 2017-05-02: 80 ug/min via INTRAVENOUS
  Administered 2017-05-03: 60 ug/min via INTRAVENOUS
  Filled 2017-04-30: qty 4
  Filled 2017-04-30 (×2): qty 40
  Filled 2017-04-30: qty 4
  Filled 2017-04-30: qty 40
  Filled 2017-04-30: qty 4
  Filled 2017-04-30 (×4): qty 40

## 2017-04-30 MED ORDER — LEVALBUTEROL HCL 1.25 MG/0.5ML IN NEBU
1.2500 mg | INHALATION_SOLUTION | Freq: Four times a day (QID) | RESPIRATORY_TRACT | Status: DC
  Administered 2017-05-01 – 2017-05-07 (×26): 1.25 mg via RESPIRATORY_TRACT
  Filled 2017-04-30 (×27): qty 0.5

## 2017-04-30 MED ORDER — MIDAZOLAM HCL 2 MG/2ML IJ SOLN
2.0000 mg | INTRAMUSCULAR | Status: DC | PRN
  Administered 2017-05-01 – 2017-05-05 (×6): 2 mg via INTRAVENOUS
  Filled 2017-04-30 (×9): qty 2

## 2017-04-30 MED ORDER — CHLORHEXIDINE GLUCONATE 0.12% ORAL RINSE (MEDLINE KIT)
15.0000 mL | Freq: Two times a day (BID) | OROMUCOSAL | Status: DC
  Administered 2017-04-30 – 2017-05-07 (×14): 15 mL via OROMUCOSAL

## 2017-04-30 MED ORDER — FENTANYL CITRATE (PF) 100 MCG/2ML IJ SOLN
100.0000 ug | INTRAMUSCULAR | Status: DC | PRN
  Administered 2017-05-01 – 2017-05-03 (×5): 100 ug via INTRAVENOUS
  Administered 2017-05-05: 50 ug via INTRAVENOUS
  Filled 2017-04-30 (×6): qty 2

## 2017-04-30 MED ORDER — LACTATED RINGERS IV SOLN
INTRAVENOUS | Status: DC
  Administered 2017-04-30 – 2017-05-02 (×3): via INTRAVENOUS

## 2017-04-30 MED ORDER — SODIUM CHLORIDE 0.9 % IV BOLUS (SEPSIS)
1000.0000 mL | Freq: Once | INTRAVENOUS | Status: AC
  Administered 2017-04-30: 1000 mL via INTRAVENOUS

## 2017-04-30 MED ORDER — FUROSEMIDE 10 MG/ML IJ SOLN
20.0000 mg | Freq: Every day | INTRAMUSCULAR | Status: DC
  Administered 2017-05-01 – 2017-05-02 (×2): 20 mg via INTRAVENOUS
  Filled 2017-04-30 (×2): qty 2

## 2017-04-30 MED ORDER — FENTANYL CITRATE (PF) 100 MCG/2ML IJ SOLN
100.0000 ug | Freq: Once | INTRAMUSCULAR | Status: AC
  Administered 2017-04-30: 100 ug via INTRAVENOUS

## 2017-04-30 MED ORDER — ETOMIDATE 2 MG/ML IV SOLN
INTRAVENOUS | Status: AC
  Filled 2017-04-30: qty 20

## 2017-04-30 MED ORDER — FENTANYL CITRATE (PF) 100 MCG/2ML IJ SOLN
100.0000 ug | INTRAMUSCULAR | Status: AC | PRN
  Administered 2017-04-30 (×3): 100 ug via INTRAVENOUS
  Filled 2017-04-30 (×3): qty 2

## 2017-04-30 MED ORDER — SODIUM CHLORIDE 0.9 % IV SOLN
0.0000 ug/min | INTRAVENOUS | Status: DC
  Administered 2017-04-30: 20 ug/min via INTRAVENOUS
  Administered 2017-04-30: 130 ug/min via INTRAVENOUS
  Filled 2017-04-30: qty 1
  Filled 2017-04-30: qty 10

## 2017-04-30 MED ORDER — MIDAZOLAM HCL 2 MG/2ML IJ SOLN
INTRAMUSCULAR | Status: AC
  Administered 2017-04-30: 4 mg via INTRAVENOUS
  Filled 2017-04-30: qty 4

## 2017-04-30 MED ORDER — DEXTROSE 50 % IV SOLN
25.0000 mL | Freq: Once | INTRAVENOUS | Status: AC
  Administered 2017-04-30: 25 mL via INTRAVENOUS
  Filled 2017-04-30: qty 50

## 2017-04-30 MED ORDER — STERILE WATER FOR INJECTION IJ SOLN
INTRAMUSCULAR | Status: AC
  Administered 2017-04-30: 13:00:00
  Filled 2017-04-30: qty 10

## 2017-04-30 MED ORDER — ORAL CARE MOUTH RINSE
15.0000 mL | OROMUCOSAL | Status: DC
  Administered 2017-04-30 – 2017-05-07 (×66): 15 mL via OROMUCOSAL

## 2017-04-30 MED ORDER — MIDAZOLAM HCL 2 MG/2ML IJ SOLN
2.0000 mg | INTRAMUSCULAR | Status: AC | PRN
  Administered 2017-04-30 – 2017-05-01 (×3): 2 mg via INTRAVENOUS
  Filled 2017-04-30 (×2): qty 2

## 2017-04-30 MED ORDER — FENTANYL CITRATE (PF) 100 MCG/2ML IJ SOLN
INTRAMUSCULAR | Status: AC
  Administered 2017-04-30: 100 ug via INTRAVENOUS
  Filled 2017-04-30: qty 2

## 2017-04-30 MED ORDER — VECURONIUM BROMIDE 10 MG IV SOLR
INTRAVENOUS | Status: AC
  Administered 2017-04-30: 10 mg via INTRAVENOUS
  Filled 2017-04-30: qty 10

## 2017-04-30 MED ORDER — VASOPRESSIN 20 UNIT/ML IV SOLN
0.0400 [IU]/min | INTRAVENOUS | Status: DC
  Administered 2017-04-30 – 2017-05-03 (×5): 0.04 [IU]/min via INTRAVENOUS
  Filled 2017-04-30 (×6): qty 2

## 2017-04-30 MED ORDER — VECURONIUM BROMIDE 10 MG IV SOLR
10.0000 mg | Freq: Once | INTRAVENOUS | Status: AC
  Administered 2017-04-30: 10 mg via INTRAVENOUS

## 2017-04-30 MED ORDER — ETOMIDATE 2 MG/ML IV SOLN
INTRAVENOUS | Status: AC
  Filled 2017-04-30: qty 10

## 2017-04-30 MED ORDER — MIDAZOLAM HCL 2 MG/2ML IJ SOLN
4.0000 mg | Freq: Once | INTRAMUSCULAR | Status: AC
  Administered 2017-04-30: 4 mg via INTRAVENOUS

## 2017-04-30 MED ORDER — FENTANYL CITRATE (PF) 100 MCG/2ML IJ SOLN: 50.0000 ug | Freq: Once | INTRAMUSCULAR | Status: DC

## 2017-04-30 MED ORDER — ETOMIDATE 2 MG/ML IV SOLN: 40.0000 mg | Freq: Once | INTRAVENOUS | Status: DC

## 2017-04-30 NOTE — Procedures (Signed)
Endotracheal Intubation: Patient required placement of an artificial airway secondary to Respiratory Failure  Consent: Emergent.   Hand washing performed prior to starting the procedure.   Medications administered for sedation prior to procedure:  Midazolam 4 mg IV,  Vecuronium 10 mg IV, Fentanyl 100 mcg IV.    A time out procedure was called and correct patient, name, & ID confirmed. Needed supplies and equipment were assembled and checked to include ETT, 10 ml syringe, Glidescope, Mac and Miller blades, suction, oxygen and bag mask valve, end tidal CO2 monitor.   Patient was positioned to align the mouth and pharynx to facilitate visualization of the glottis.   Heart rate, SpO2 and blood pressure was continuously monitored during the procedure. Pre-oxygenation was conducted prior to intubation and endotracheal tube was placed through the vocal cords into the trachea.     The artificial airway was placed under direct visualization via glidescope route using a 7.5 ETT on the first attempt.  ETT was secured at 23 cm mark.  Placement was confirmed by auscuitation of lungs with good breath sounds bilaterally and no stomach sounds.  Condensation was noted on endotracheal tube.   Pulse ox 98%.  CO2 detector in place with appropriate color change.   Complications: None .   Operator: Elion Hocker.   Chest radiograph ordered and pending.   Comments: OGT placed via glidescope.  Qunicy Higinbotham David Bird Tailor, M.D.  [facility]  Medical Director ICU-[facility] [place] Medical Director [facility] Cardio-Pulmonary Department       

## 2017-04-30 NOTE — Progress Notes (Signed)
Clinical Education officer, museum (CSW) contacted [employer] at [facility] and made her aware patient is back in the ICU.   [employer], LCSW [phone]

## 2017-04-30 NOTE — Progress Notes (Signed)
[facility] Cardiology  SUBJECTIVE: Patient lying in bed, lethargic, with O2 by mask   Vitals:   04/30/17 0435 04/30/17 0500 04/30/17 0600 04/30/17 0700  BP:  116/73 125/79 121/69  Pulse:  (!) 116 (!) 120 (!) 121  Resp:  (!) 25 (!) 22 (!) 23  Temp:      TempSrc:      SpO2:  99% 98% 95%  Weight: 70.9 kg (156 lb 4.9 oz)     Height:         Intake/Output Summary (Last 24 hours) at 04/30/2017 0804 Last data filed at 04/30/2017 0700 Gross per 24 hour  Intake 2120 ml  Output 730 ml  Net 1390 ml      PHYSICAL EXAM  General: Moderate respiratory distress HEENT:  Normocephalic and atramatic Neck:  No JVD.  Lungs: Clear bilaterally to auscultation and percussion. Heart: Tachycardia. Normal S1 and S2 without gallops or murmurs.  Abdomen: Bowel sounds are positive, abdomen soft and non-tender  Msk:  Back normal, normal gait. Normal strength and tone for age. Extremities: No clubbing, cyanosis or edema.   Neuro: Lethargic Psych: Lethargic   LABS: Basic Metabolic Panel: Recent Labs    04/28/17 0753  NA 143  K 3.6  CL 110  CO2 22  GLUCOSE 262*  BUN 80*  CREATININE 1.63*  CALCIUM 8.4*   Liver Function Tests: No results for input(s): AST, ALT, ALKPHOS, BILITOT, PROT, ALBUMIN in the last 72 hours. No results for input(s): LIPASE, AMYLASE in the last 72 hours. CBC: Recent Labs    04/28/17 0753  WBC 11.2*  NEUTROABS 10.7*  HGB 8.1*  HCT 26.2*  MCV 97.9  PLT 106*   Cardiac Enzymes: No results for input(s): CKTOTAL, CKMB, CKMBINDEX, TROPONINI in the last 72 hours. BNP: Invalid input(s): POCBNP D-Dimer: No results for input(s): DDIMER in the last 72 hours. Hemoglobin A1C: No results for input(s): HGBA1C in the last 72 hours. Fasting Lipid Panel: No results for input(s): CHOL, HDL, LDLCALC, TRIG, CHOLHDL, LDLDIRECT in the last 72 hours. Thyroid Function Tests: No results for input(s): TSH, T4TOTAL, T3FREE, THYROIDAB in the last 72 hours.  Invalid input(s): FREET3 Anemia  Panel: No results for input(s): VITAMINB12, FOLATE, FERRITIN, TIBC, IRON, RETICCTPCT in the last 72 hours.  Dg Chest Port 1 View  Result Date: 04/28/2017 CLINICAL DATA:  Cough EXAM: PORTABLE CHEST 1 VIEW COMPARISON:  04/26/2017, 04/24/2017, 12/27/2015 FINDINGS: Diffuse bilateral interstitial and ground-glass opacity, similar compared to prior. No pleural effusion. Stable enlarged cardiomediastinal silhouette with aortic atherosclerosis. No pneumothorax. Gas-filled enlarged bowel in the upper abdomen. IMPRESSION: No significant interval change in diffuse interstitial and ground-glass opacities, suspect for edema or diffuse pneumonia superimposed on chronic change. Cardiomegaly. Electronically Signed   By: Donavan Foil M.D.   On: 04/28/2017 22:16     Echo LVEF 30-35%  TELEMETRY: Sinus tachycardia:  ASSESSMENT AND PLAN:  Active Problems:   COPD with acute exacerbation (HCC)   Hip fracture (HCC)   Acute respiratory failure with hypoxia (HCC)   Acute pulmonary edema (HCC)   Malnutrition of moderate degree    1.  Respiratory failure, multifactorial, secondary to COPD exacerbation/pneumonia/CHF 2.  Acute systolic congestive heart failure  NYHA class IV  ACC/AHA stage C  Etiology: Idiopathic  HFrEF  30-35% 3.  Moderate cardiomyopathy, of uncertain etiology and duration 4.  Chronic kidney disease 5.  Cirrhosis of the liver 6.  Status post left hip fracture and hemiarthroplasty  Recommendations  1.  Agree with overall current therapy  2.  Continue carvedilol 6.25 mg twice daily 3.  Continue isosorbide mononitrate 30 mg daily for afterload reduction 4.  Continue spironolactone 25 mg daily 5.  Defer ACE inhibitor or ARB at this time due to labile renal function  Sign off for now, please call with any questions   Isaias Cowman, MD, PhD, [facility] 04/30/2017 8:04 AM

## 2017-04-30 NOTE — Progress Notes (Signed)
Subjective:  Patient intubated today.  No history can be obtained.  Family at the bedside.  Objective:   VITALS:   Vitals:   04/30/17 1000 04/30/17 1010 04/30/17 1100 04/30/17 1200  BP: (!) 78/49 (!) 93/58 110/64 (!) 110/50  Pulse: (!) 114 (!) 115 (!) 121 (!) 121  Resp: (!) 21 (!) 23 (!) 21 (!) 22  Temp:  100.3 F (37.9 C)    TempSrc:  Axillary    SpO2: 98% 98% 100% 100%  Weight:      Height:        PHYSICAL EXAM:  Deferred.  LABS  Results for orders placed or performed during the hospital encounter of 04/05/2017 (from the past 24 hour(s))  Glucose, capillary     Status: Abnormal   Collection Time: 04/29/17  4:24 PM  Result Value Ref Range   Glucose-Capillary 115 (H) 65 - 99 mg/dL  Glucose, capillary     Status: Abnormal   Collection Time: 04/29/17  8:17 PM  Result Value Ref Range   Glucose-Capillary 30 (LL) 65 - 99 mg/dL  Glucose, capillary     Status: None   Collection Time: 04/29/17  8:53 PM  Result Value Ref Range   Glucose-Capillary 75 65 - 99 mg/dL  Glucose, capillary     Status: Abnormal   Collection Time: 04/30/17 12:01 AM  Result Value Ref Range   Glucose-Capillary 57 (L) 65 - 99 mg/dL  Glucose, capillary     Status: Abnormal   Collection Time: 04/30/17 12:27 AM  Result Value Ref Range   Glucose-Capillary 146 (H) 65 - 99 mg/dL  Glucose, capillary     Status: None   Collection Time: 04/30/17  3:36 AM  Result Value Ref Range   Glucose-Capillary 70 65 - 99 mg/dL  Glucose, capillary     Status: Abnormal   Collection Time: 04/30/17  4:30 AM  Result Value Ref Range   Glucose-Capillary 169 (H) 65 - 99 mg/dL  Glucose, capillary     Status: Abnormal   Collection Time: 04/30/17  7:54 AM  Result Value Ref Range   Glucose-Capillary 106 (H) 65 - 99 mg/dL  Ammonia     Status: Abnormal   Collection Time: 04/30/17 10:43 AM  Result Value Ref Range   Ammonia 52 (H) 9 - 35 umol/L  Glucose, capillary     Status: Abnormal   Collection Time: 04/30/17 11:47 AM   Result Value Ref Range   Glucose-Capillary 121 (H) 65 - 99 mg/dL    Dg Abd 1 View  Result Date: 04/30/2017 CLINICAL DATA:  Nasal/orogastric tube placement. EXAM: ABDOMEN - 1 VIEW COMPARISON:  None. FINDINGS: Nasal/orogastric tube tip projects in the proximal stomach chest through the GE junction. Recommend further insertion, 10 cm, to allow the side hole of the tube to fully into the stomach. Normal bowel gas pattern. There are densities which project over the right kidney reflecting intrarenal stones noted on a CT dated 03/07/2016. IMPRESSION: 1. Nasal/orogastric tube tip passes chest through the GE junction. Recommend further insertion of 10 cm. 2. No acute findings in the abdomen. 3. Left intrarenal stones. Electronically Signed   By: Lajean Manes M.D.   On: 04/30/2017 13:35   Dg Chest Port 1 View  Result Date: 04/30/2017 CLINICAL DATA:  Status post intubation and nasal/orogastric tube placement. EXAM: PORTABLE CHEST 1 VIEW COMPARISON:  04/28/2017 FINDINGS: The endotracheal tube tip projects 2.7 cm above the Carina, well positioned. Nasal/orogastric tube tip projects in the proximal stomach, with the  side hole in the distal esophagus. Recommend further insertion of approximately 10 cm for more optimal positioning. Cardiac silhouette is normal in size. No mediastinal or hilar masses. Irregularly thickened bronchovascular interstitial markings are noted bilaterally. No new lung abnormalities. No convincing pleural effusion or pneumothorax. IMPRESSION: 1. Endotracheal tube is well positioned. 2. Nasal/orogastric tube tip projects in the proximal stomach. Recommend further insertion of 10 cm to allow the side hole to fully into the stomach. 3. No change in the appearance of the lungs. Electronically Signed   By: Lajean Manes M.D.   On: 04/30/2017 13:34   Dg Chest Port 1 View  Result Date: 04/28/2017 CLINICAL DATA:  Cough EXAM: PORTABLE CHEST 1 VIEW COMPARISON:  04/26/2017, 04/24/2017, 12/27/2015  FINDINGS: Diffuse bilateral interstitial and ground-glass opacity, similar compared to prior. No pleural effusion. Stable enlarged cardiomediastinal silhouette with aortic atherosclerosis. No pneumothorax. Gas-filled enlarged bowel in the upper abdomen. IMPRESSION: No significant interval change in diffuse interstitial and ground-glass opacities, suspect for edema or diffuse pneumonia superimposed on chronic change. Cardiomegaly. Electronically Signed   By: Donavan Foil M.D.   On: 04/28/2017 22:16    Assessment/Plan: 9 Days Post-Op   Active Problems:   COPD with acute exacerbation (HCC)   Hip fracture (HCC)   Acute respiratory failure with hypoxia (HCC)   Acute pulmonary edema (HCC)   Malnutrition of moderate degree  Dressings provided to patient's RN.   Left hip dressing can be changed whenever patient is getting changed or bathed.  Recommend PT for left hip restart once patient stabilizes.    Continue anticoagulation therapy for DVT prophylaxis.    Thornton Park , MD 04/30/2017, 2:08 PM

## 2017-04-30 NOTE — Progress Notes (Signed)
[place] [facility] [place] NAME: Samantha Mcmillan    MR#:  [id number]  DATE OF BIRTH:  1956-02-13  SUBJECTIVE:  CHIEF COMPLAINT:   Chief Complaint  Patient presents with  . Hip Pain  . Fall  no events overnight, continued severe weakness  REVIEW OF SYSTEMS:  CONSTITUTIONAL: No fever, fatigue or weakness.  EYES: No blurred or double vision.  EARS, NOSE, AND THROAT: No tinnitus or ear pain.  RESPIRATORY: No cough, shortness of breath, wheezing or hemoptysis.  CARDIOVASCULAR: No chest pain, orthopnea, edema.  GASTROINTESTINAL: No nausea, vomiting, diarrhea or abdominal pain.  GENITOURINARY: No dysuria, hematuria.  ENDOCRINE: No polyuria, nocturia,  HEMATOLOGY: No anemia, easy bruising or bleeding SKIN: No rash or lesion. MUSCULOSKELETAL: No joint pain or arthritis.   NEUROLOGIC: No tingling, numbness, weakness.  PSYCHIATRY: No anxiety or depression.   ROS  DRUG ALLERGIES:   Allergies  Allergen Reactions  . Iodine Shortness Of Breath  . Latex Anaphylaxis  . Other Rash, Other (See Comments), Shortness Of Breath, Anaphylaxis and Swelling    Pt states that she is allergic to Darvocet.   . Oxycodone Shortness Of Breath, Rash and Swelling    Other reaction(s): Other (See Comments) wheezing  . Oxycodone-Acetaminophen Other (See Comments), Rash, Swelling and Shortness Of Breath    wheezing wheezing  . Shellfish-Derived Products Anaphylaxis, Rash, Shortness Of Breath and Swelling    Throat closes.  . Amitriptyline Other (See Comments)    Other reaction(s): Other (See Comments) Mental changes Other reaction(s): Other (See Comments) Altered mental status, agitation Reaction:  Mental changes   . Fish-Derived Products Other (See Comments) and Swelling    Other reaction(s): Other (See Comments) "respiratory distress and wheezing" "respiratory distress and wheezing"  . Tape Other (See Comments)    Other reaction(s): Other (See  Comments) Pulls skin off Pt states that it pulls her skin off.  Pt states that it pulls her skin off.   . Wellbutrin [Bupropion] Other (See Comments)    Other reaction(s): Other (See Comments) Mental changes Reaction:  Mental changes   . Augmentin [Amoxicillin-Pot Clavulanate] Rash and Other (See Comments)    Has patient had a PCN reaction causing immediate rash, facial/tongue/throat swelling, SOB or lightheadedness with hypotension: No Has patient had a PCN reaction causing severe rash involving mucus membranes or skin necrosis: No Has patient had a PCN reaction that required hospitalization No Has patient had a PCN reaction occurring within the last 10 years: No If all of the above answers are "NO", then may proceed with Cephalosporin use.  . Codeine Rash  . Cortisone Rash  . Diphenhydramine Rash  . Hydrocodone-Acetaminophen Rash    dizzy  . Vicodin [Hydrocodone-Acetaminophen] Rash and Other (See Comments)    Reaction:  Dizziness      VITALS:  Blood pressure 110/64, pulse (!) 121, temperature 100.3 F (37.9 C), temperature source Axillary, resp. rate (!) 21, height 5\' 4"  (1.626 m), weight 70.9 kg (156 lb 4.9 oz), SpO2 100 %.  PHYSICAL EXAMINATION:  GENERAL:  62 y.o.-year-old patient lying in the bed with no acute distress.  EYES: Pupils equal, round, reactive to light and accommodation. No scleral icterus. Extraocular muscles intact.  HEENT: Head atraumatic, normocephalic. Oropharynx and nasopharynx clear.  NECK:  Supple, no jugular venous distention. No thyroid enlargement, no tenderness.  LUNGS: Normal breath sounds bilaterally, no wheezing, rales,rhonchi or crepitation. No use of accessory muscles of respiration.  CARDIOVASCULAR: S1, S2 normal. No murmurs, rubs, or  gallops.  ABDOMEN: Soft, nontender, nondistended. Bowel sounds present. No organomegaly or mass.  EXTREMITIES: No pedal edema, cyanosis, or clubbing.  NEUROLOGIC: Cranial nerves II through XII are intact. Muscle  strength 5/5 in all extremities. Sensation intact. Gait not checked.  PSYCHIATRIC: The patient is alert and oriented x 3.  SKIN: No obvious rash, lesion, or ulcer.   Physical Exam LABORATORY PANEL:   CBC Recent Labs  Lab 04/28/17 0753  WBC 11.2*  HGB 8.1*  HCT 26.2*  PLT 106*   ------------------------------------------------------------------------------------------------------------------  Chemistries  Recent Labs  Lab 04/26/17 0513 04/27/17 0414 04/28/17 0753  NA 137 140 143  K 4.1 3.8 3.6  CL 102 106 110  CO2 20* 21* 22  GLUCOSE 209* 197* 262*  BUN 57* 73* 80*  CREATININE 1.56* 1.72* 1.63*  CALCIUM 8.4* 8.1* 8.4*  MG 2.0  --   --   AST 80* 71*  --   ALT 42 51  --   ALKPHOS 114 101  --   BILITOT 2.0* 1.8*  --    ------------------------------------------------------------------------------------------------------------------  Cardiac Enzymes No results for input(s): TROPONINI in the last 168 hours. ------------------------------------------------------------------------------------------------------------------  RADIOLOGY:  Dg Chest Port 1 View  Result Date: 04/28/2017 CLINICAL DATA:  Cough EXAM: PORTABLE CHEST 1 VIEW COMPARISON:  04/26/2017, 04/24/2017, 12/27/2015 FINDINGS: Diffuse bilateral interstitial and ground-glass opacity, similar compared to prior. No pleural effusion. Stable enlarged cardiomediastinal silhouette with aortic atherosclerosis. No pneumothorax. Gas-filled enlarged bowel in the upper abdomen. IMPRESSION: No significant interval change in diffuse interstitial and ground-glass opacities, suspect for edema or diffuse pneumonia superimposed on chronic change. Cardiomegaly. Electronically Signed   By: Donavan Foil M.D.   On: 04/28/2017 22:16    ASSESSMENT AND PLAN:  62 yo female w/ hx of DM, HTN, Hyperlipidemia, hx of Cirrhosis, Chronic Bronchitis, Anxiety, Anemia, GERD, Depression, PTSD, Chronic Diastolic CHF, hx of Panic Attack who presented to  the hospital due to a fall and noted to have a Left Hip Fracture.   1. Acute hypoxic respiratory failure Stable Secondary to bilateralHCAP,COPD,and CHFexacerbation  CT chest negative for PE, noted extensive bilateral groundglass opacities with broad differential, elevated pro-calcitonin level  Continue BTs, mucolytic agents,RTfollowing, oxygenwith weaning as tolerated, pulmonologyfollowing,blood gas noted for moderate hypoxia, echo for EF 30-35%, cardiology input appreciated, continueLasix, ST following, dietary following, ? need for enteral versus parenteral nutrition?, ? PEG  tube placement   2.Acute on chronic diastolic CHF Resolving slowly Acute hypoxia noted this morning April 24, 2017 requiringtransfer to ICU for closer monitoring, transferred back to regular nursing floor bed on April 29, 2010,  was transferred back to the ICU on April 29, 2017 for acute respiratory failure/hypoxia,chest x-ray noted for edema versus pneumonia/repeat was unchanged, CT chest noted ContinueCHFprotocol, Lasix,Aldactone, metoprolol,echo noted above, cardiology input appreciated  Echo from 2016with normal ejection fraction  3.AcutebilateralHCAP Resolving Chest x-ray, CT chest noted Continue pneumonia protocol, empiricCefepime for 5-7 day course  4.Acute on COPD exacerbation Stable  ContinueIV Solu-Medrol with tapering as tolerated, inhaled corticosteroids, supplemental oxygenprn,mucolytic agents  5.S/p Fall and Left Hip Fracture s/pleft hip hemiarthroplastyby orthopedic surgery  6. Hxof gout Stable  Continue allopurinol  7.Hypertension,chronic Stable on current regiment  8.Chronic diabetes mellitus type 2 Stable on current regiment  9.Chronic tobacco smoking abuse/dependency Cessation counseling and nicotine patch daily  10.Noncompliant with medical management Stable The importance of compliance was encouraged  Long-term prognosis is  poor-palliative care input appreciated  All the records are reviewed and case discussed with Care Management/Social Workerr. Management plans  discussed with the patient, family and they are in agreement.  CODE STATUS: full  TOTAL TIME TAKING CARE OF THIS PATIENT: 35 minutes.     POSSIBLE D/C IN 2-5 DAYS, DEPENDING ON CLINICAL CONDITION.   Avel Peace Salary M.D on 04/30/2017   Between 7am to 6pm - Pager - [phone]  After 6pm go to [email or web address] - password [email or web address] [facility]  Office  [phone]  CC: Primary care physician; Casilda Carls, MD  Note: This dictation was prepared with Dragon dictation along with smaller phrase technology. Any transcriptional errors that result from this process are unintentional.

## 2017-04-30 NOTE — Progress Notes (Signed)
PT Cancellation Note  Patient Details Name: Samantha Mcmillan MRN: [id number] DOB: 1956/02/06   Cancelled Treatment:    Reason Eval/Treat Not Completed: Medical issues which prohibited therapy. Pt transferred back to CCU due to decline in respiratory status. Pt on venturi mask at 55% and currently pt HR at 122 bpm at rest. Due to change in status, will hold and cancel current PT order. Please re-order when pt medically stable and able to participate in therapy.   Otis Burress 04/30/2017, 8:39 AM  Greggory Stallion, PT, DPT [phone]

## 2017-04-30 NOTE — Progress Notes (Signed)
Pharmacy ICU Medication Monitoring Consult:  Pharmacy consulted to assist in monitoring and replacing electrolytes in this 62 y.o. female admitted on 04/11/2017 with Hip Pain and Fall  Patient currently requiring mechanical ventilation.   Labs:  Sodium (mmol/L)  Date Value  04/30/2017 146 (H)  05/31/2014 128 (A)  03/15/2014 136   Potassium (mmol/L)  Date Value  04/30/2017 4.2  03/15/2014 3.1 (L)   Magnesium (mg/dL)  Date Value  04/30/2017 2.6 (H)  03/15/2014 1.6 (L)   Phosphorus (mg/dL)  Date Value  04/30/2017 5.0 (H)   Calcium (mg/dL)  Date Value  04/30/2017 8.3 (L)   Calcium, Total (mg/dL)  Date Value  03/15/2014 7.7 (L)   Albumin (g/dL)  Date Value  04/27/2017 1.9 (L)  03/15/2014 2.1 (L)    Plan:  1. Electrolytes: Sodium is mildly elevated, will transition NS @40mL /hr to LR @40mL /hr.   2. Glucose: Patient with hypoglycemia overnight. Will hold scheduled long acting insulin. Continue SSI Q4hr.   3. Constipation: Will continue lactulose 69mL TID. Will order follow up ammonia for 3/10.   Pharmacy will continue to monitor and adjust per consult.   Earlisha Sharples L 04/30/2017 8:03 PM

## 2017-04-30 NOTE — Progress Notes (Signed)
SLP Cancellation Note  Patient Details Name: Samantha Mcmillan MRN: [id number] DOB: 1955-08-13   Cancelled treatment:       Reason Eval/Treat Not Completed: Medical issues which prohibited therapy;Patient not medically ready;Fatigue/lethargy limiting ability to participate(chart reviewed; consulted NSG re: change in status). Pt was transferred back to CCU d/t increased HR. She is on a nonrebreather mask w/ increased O2 support at this time as well. Due to the decline in status and return to O2 mask w/ increased support, pt would be at increased risk for dysphagia and aspiration. NSG consulted and agreed w/ holding the oral diet at this time d/t the increased risk. Dietician updated. ST services will f/u w/ pt's medical and respiratory status' are improved and pt is safe for reassessment, oral intake. Recommend frequent oral care, aspiration precautions.    Orinda Kenner, MS, CCC-SLP Watson,Katherine 04/30/2017, 10:50 AM

## 2017-04-30 NOTE — Progress Notes (Signed)
PULMONARY / CRITICAL CARE MEDICINE   Name: [name] MRN: [id number] DOB: 10-Feb-1956    ADMISSION DATE:  04/10/2017   CONSULTATION DATE:  04/25/2017  REFERRING MD:  Dr. Jerelyn Charles  REASON: Acute respiratory failure and AMS  HISTORY OF PRESENT ILLNESS:   This is a 62 y/o female with a medical history as indicated below admitted with a traumatic fall with a left hip fracture. She is 4 days post left hip hemiarthroplasty. Developed acute respiratory failure secondary to HCAP, pulmonary edema and acute COPD exacerbation and was transferred to the ICU. Her chest x-ray shows diffuse pulmonary edema and multifocal pneumonia. Patient is confused and unable to provide any further history.  She denies pain chest pain nausea and vomiting.  SIGNIFICANT EVENTS: 02/28>L-Hip arthroplasty 03/03>ICU with respiratory failure 03/06>transferred to med/surg 03/07>transferred back to icu for hypoxic during PT  Subjective: Patient remains on supplemental O2 via VM. No really responding to most of my questions. Husband at bedside. Nods in denial of pain.   VITAL SIGNS: BP 121/69   Pulse (!) 121   Temp (!) 96 F (35.6 C) (Axillary)   Resp (!) 23   Ht 5\' 4"  (1.626 m)   Wt 156 lb 4.9 oz (70.9 kg)   LMP  (LMP Unknown)   SpO2 95%   BMI 26.83 kg/m     VENTILATOR SETTINGS: FiO2 (%):  [55 %] 55 %  INTAKE / OUTPUT: I/O last 3 completed shifts: In: 2120 [I.V.:2120] Out: 1130 [Urine:1130]  PHYSICAL EXAMINATION: General: alert and awake, no acute distress Neuro: Alert to person only, moves all extremities, follows some basic commands HEENT: PERRLA, trachea midline, mild JVD Cardiovascular: Sinus tachycardia, S1-S2, no murmur regurg or gallop, +2 pulses, +1 edema bilaterally Lungs: CTA b/l no wheezes Abdomen: Nondistended, normal bowel sounds Musculoskeletal: limited range of motion in left lower extremity; normal in  bilateral upper extremities with normal range of motion, no deformities Skin:  Multiple bruises in bilateral upper extremities, skin is warm and dry without any rash  LABS:  BMET Recent Labs  Lab 04/26/17 0513 04/27/17 0414 04/28/17 0753  NA 137 140 143  K 4.1 3.8 3.6  CL 102 106 110  CO2 20* 21* 22  BUN 57* 73* 80*  CREATININE 1.56* 1.72* 1.63*  GLUCOSE 209* 197* 262*    Electrolytes Recent Labs  Lab 04/26/17 0513 04/27/17 0414 04/28/17 0753  CALCIUM 8.4* 8.1* 8.4*  MG 2.0  --   --   PHOS 5.9*  --   --     CBC Recent Labs  Lab 04/26/17 0513 04/27/17 0414 04/28/17 0753  WBC 12.7* 9.4 11.2*  HGB 8.3* 8.1* 8.1*  HCT 25.6* 24.2* 26.2*  PLT 119* 89* 106*    Coag's No results for input(s): APTT, INR in the last 168 hours.  Sepsis Markers Recent Labs  Lab 04/24/17 1040 04/26/17 0513  LATICACIDVEN  --  1.2  PROCALCITON 0.64  --     ABG Recent Labs  Lab 04/25/17 1130  PHART 7.39  PCO2ART 39  PO2ART 51*    Liver Enzymes Recent Labs  Lab 04/26/17 0513 04/27/17 0414  AST 80* 71*  ALT 42 51  ALKPHOS 114 101  BILITOT 2.0* 1.8*  ALBUMIN 1.9* 1.9*    Cardiac Enzymes No results for input(s): TROPONINI, PROBNP in the last 168 hours.  Glucose Recent Labs  Lab 04/29/17 2017 04/29/17 2053 04/30/17 0001 04/30/17 0027 04/30/17 0336 04/30/17 0430  GLUCAP 30* 75 57* 146* 70 169*  Imaging No results found.  STUDIES:  ECHO 04/25/2017 LV EF: 30% -   35% with moderate tricuspid regurge  CULTURES: 04/24/2017>negative to date  ANTIBIOTICS: Vancomycin 3/3>completed Cefepime 3/3>completed  LINES/TUBES: PIVs Foley  DISCUSSION: 62 year old female admitted with a left hip fracture from a traumatic fall at home now presenting with acute respiratory failure secondary to pneumonia and pulmonary edema from CHF exacerbation-slwoly resolving  ASSESSMENT  Acute hypoxic respiratory failure-slowly resolving Acute encephalopathy secondary to hypoxemia-resolving Acute on chronic CHF exacerbation Acute on chronic COPD  exacerbation Acute pulmonary edema Acute renal failure Healthcare acquired pneumonia Left hip fracture status post left hemiarthroplasty PLAN -Currently on steroids, can wean as tolerated. -Continue nebulized medications. --Diurese as tolerated, however the patients cirrhosis and AKI makes this difficult.  -Patient will need outpatient pulmonary follow-up with repeat CT chest to ensure resolution of pulmonary edema/pneumonitis changes.  Patient remains on VM and has not required any escalation of interventions tonight. Her overall prognosis remains guarded. Will consult palliative and transfers patient back to med-surg  Patient is a full code.   Lizabeth Fellner [facility] ANP-BC Pulmonary and Critical Care Medicine [facility] Pager [phone] or [phone]  NB: This document was prepared using Dragon voice recognition software and may include unintentional dictation errors.

## 2017-04-30 NOTE — Progress Notes (Signed)
Patient's O2 sats dropped to mid-70s twice this shift while patient was resting.  Patient switched from [facility] to 100% NonRebreather mask.  Dr. Jefferson Fuel in to discuss plan of care with family. Will continue to assess and monitor closely.

## 2017-04-30 NOTE — Progress Notes (Signed)
   04/30/17 1050  Clinical Encounter Type  Visited With Patient and family together  Visit Type Follow-up  Spiritual Encounters  Spiritual Needs Emotional   Chaplain met with patient and family, offering emotional support to patient son/building rapport.  Son open to ongoing chaplain follow up.

## 2017-05-01 ENCOUNTER — Inpatient Hospital Stay: Payer: Medicare Other

## 2017-05-01 LAB — BLOOD GAS, ARTERIAL
Acid-base deficit: 2.5 mmol/L — ABNORMAL HIGH (ref 0.0–2.0)
Bicarbonate: 24.4 mmol/L (ref 20.0–28.0)
FIO2: 0.5
O2 Saturation: 98.5 %
PATIENT TEMPERATURE: 37
PCO2 ART: 52 mmHg — AB (ref 32.0–48.0)
PEEP: 5 cmH2O
PO2 ART: 127 mmHg — AB (ref 83.0–108.0)
RATE: 20 resp/min
VT: 450 mL
pH, Arterial: 7.28 — ABNORMAL LOW (ref 7.350–7.450)

## 2017-05-01 LAB — CBC
HCT: 25.3 % — ABNORMAL LOW (ref 35.0–47.0)
HEMOGLOBIN: 7.7 g/dL — AB (ref 12.0–16.0)
MCH: 30.5 pg (ref 26.0–34.0)
MCHC: 30.4 g/dL — AB (ref 32.0–36.0)
MCV: 100.6 fL — ABNORMAL HIGH (ref 80.0–100.0)
Platelets: 117 10*3/uL — ABNORMAL LOW (ref 150–440)
RBC: 2.51 MIL/uL — AB (ref 3.80–5.20)
RDW: 17.4 % — ABNORMAL HIGH (ref 11.5–14.5)
WBC: 19.4 10*3/uL — AB (ref 3.6–11.0)

## 2017-05-01 LAB — BASIC METABOLIC PANEL
ANION GAP: 10 (ref 5–15)
BUN: 94 mg/dL — ABNORMAL HIGH (ref 6–20)
CHLORIDE: 119 mmol/L — AB (ref 101–111)
CO2: 20 mmol/L — ABNORMAL LOW (ref 22–32)
CREATININE: 2.22 mg/dL — AB (ref 0.44–1.00)
Calcium: 8 mg/dL — ABNORMAL LOW (ref 8.9–10.3)
GFR calc non Af Amer: 22 mL/min — ABNORMAL LOW (ref >60)
GFR, EST AFRICAN AMERICAN: 26 mL/min — AB (ref >60)
Glucose, Bld: 180 mg/dL — ABNORMAL HIGH (ref 65–99)
POTASSIUM: 3.8 mmol/L (ref 3.5–5.1)
SODIUM: 149 mmol/L — AB (ref 135–145)

## 2017-05-01 LAB — PHOSPHORUS: Phosphorus: 4.6 mg/dL (ref 2.5–4.6)

## 2017-05-01 LAB — GLUCOSE, CAPILLARY
GLUCOSE-CAPILLARY: 164 mg/dL — AB (ref 65–99)
GLUCOSE-CAPILLARY: 161 mg/dL — AB (ref 65–99)
Glucose-Capillary: 149 mg/dL — ABNORMAL HIGH (ref 65–99)
Glucose-Capillary: 148 mg/dL — ABNORMAL HIGH (ref 65–99)
Glucose-Capillary: 168 mg/dL — ABNORMAL HIGH (ref 65–99)
Glucose-Capillary: 165 mg/dL — ABNORMAL HIGH (ref 65–99)
Glucose-Capillary: 163 mg/dL — ABNORMAL HIGH (ref 65–99)

## 2017-05-01 LAB — MAGNESIUM: Magnesium: 2.4 mg/dL (ref 1.7–2.4)

## 2017-05-01 MED ORDER — ENOXAPARIN SODIUM 30 MG/0.3ML ~~LOC~~ SOLN
30.0000 mg | SUBCUTANEOUS | Status: DC
  Administered 2017-05-02 – 2017-05-03 (×2): 30 mg via SUBCUTANEOUS
  Filled 2017-05-01 (×2): qty 0.3

## 2017-05-01 MED ORDER — FENTANYL 2500MCG IN NS 250ML (10MCG/ML) PREMIX INFUSION
0.0000 ug/h | INTRAVENOUS | Status: DC
  Administered 2017-05-01: 50 ug/h via INTRAVENOUS
  Administered 2017-05-03: 100 ug/h via INTRAVENOUS
  Filled 2017-05-01 (×2): qty 250

## 2017-05-01 NOTE — Progress Notes (Signed)
Pharmacy ICU Medication Monitoring Consult:  Pharmacy consulted to assist in monitoring and replacing electrolytes in this 62 y.o. female admitted on 04/11/2017 with Hip Pain and Fall  Patient currently requiring mechanical ventilation.   Labs:  Sodium (mmol/L)  Date Value  05/01/2017 149 (H)  05/31/2014 128 (A)  03/15/2014 136   Potassium (mmol/L)  Date Value  05/01/2017 3.8  03/15/2014 3.1 (L)   Magnesium (mg/dL)  Date Value  05/01/2017 2.4  03/15/2014 1.6 (L)   Phosphorus (mg/dL)  Date Value  05/01/2017 4.6   Calcium (mg/dL)  Date Value  05/01/2017 8.0 (L)   Calcium, Total (mg/dL)  Date Value  03/15/2014 7.7 (L)   Albumin (g/dL)  Date Value  04/27/2017 1.9 (L)  03/15/2014 2.1 (L)    Plan:  1. Electrolytes: WNL except Na 149. No supplementation at this time. Patient on lasix 20 IV daily  2. Glucose: Continue SSI Q4hr , current order for Lantus 25 units BID  3. Constipation:On lactulose 14mL TID. Will order follow up ammonia for 3/10.   Pharmacy will continue to monitor and adjust per consult.   Khyree Carillo A 05/01/2017 10:37 AM

## 2017-05-01 NOTE — Progress Notes (Signed)
[facility] Critical Care Medicine Progess Note    SYNOPSIS   [name] is a 62 year old female with past medical history remarkable for COPD, status post hip fracture with left hip hemiarthroplasty with acute on chronic respiratory failure with superimposed pulmonary edema was transferred to the intensive care unit.   ASSESSMENT/PLAN   Respiratory failure. Patient required intubation yesterday. Diffusely bronchospastic on exam. Continue albuterol, Atrovent, Solu-Medrol. Not ready to try extubation trial. Patient is empirically started on cefepime, budesonide,albuterol, Atrovent, Solu-Medrol  Renal insufficiency. Increased to 94/2.2 from 91/2.084 follow closely  Leukocytosis. hite count is 19.4 on broad-spectrum coverage  Anemia. Hemoglobin 7.7 will follow closely  Status post repair of hip fracture  Critical care time 35 minutes   VENTILATOR SETTINGS: Vent Mode: PRVC FiO2 (%):  [28 %-100 %] 28 % Set Rate:  [16 bmp-20 bmp] 20 bmp Vt Set:  [450 mL-500 mL] 500 mL PEEP:  [5 cmH20] 5 cmH20  INTAKE / OUTPUT:  Intake/Output Summary (Last 24 hours) at 05/01/2017 0916 Last data filed at 05/01/2017 0900 Gross per 24 hour  Intake 3328.63 ml  Output 995 ml  Net 2333.63 ml    Name: Samantha Mcmillan MRN: [id number] DOB: 03-Jul-1955    ADMISSION DATE:  04/08/2017  SUBJECTIVE:   Pt currently on the ventilator, can not provide history or review of systems.   VITAL SIGNS: Temp:  [97.4 F (36.3 C)-100.3 F (37.9 C)] 98.2 F (36.8 C) (03/09 0418) Pulse Rate:  [28-121] 76 (03/09 0900) Resp:  [18-25] 20 (03/09 0900) BP: (62-125)/(39-92) 106/54 (03/09 0900) SpO2:  [97 %-100 %] 100 % (03/09 0900) FiO2 (%):  [28 %-100 %] 28 % (03/09 0900) Weight:  [73.3 kg (161 lb 9.6 oz)] 73.3 kg (161 lb 9.6 oz) (03/09 0415)  PHYSICAL EXAMINATION: Physical Examination:   VS: BP (!) 106/54   Pulse 76   Temp 98.2 F (36.8 C) (Axillary)   Resp 20   Ht 5\' 4"  (1.626 m)   Wt 73.3 kg (161 lb  9.6 oz)   LMP  (LMP Unknown)   SpO2 100%   BMI 27.74 kg/m   General Appearance: patient is sedated on mechanical ventilation Neuro:Limited exam HEENT: patient is orally intubated, oral gastric tube in place, trachea is midline Pulmonary: diffuse bronchospasm appreciated on exam CardiovascularNormal S1,S2.   Abdomen: positive bowel sounds, soft exam. Skin:   warm, no rashes, no ecchymosis  Extremities: normal, no cyanosis, clubbing.    LABORATORY PANEL:   CBC Recent Labs  Lab 05/01/17 0458  WBC 19.4*  HGB 7.7*  HCT 25.3*  PLT 117*    Chemistries  Recent Labs  Lab 04/27/17 0414  05/01/17 0458  NA 140   < > 149*  K 3.8   < > 3.8  CL 106   < > 119*  CO2 21*   < > 20*  GLUCOSE 197*   < > 180*  BUN 73*   < > 94*  CREATININE 1.72*   < > 2.22*  CALCIUM 8.1*   < > 8.0*  MG  --    < > 2.4  PHOS  --    < > 4.6  AST 71*  --   --   ALT 51  --   --   ALKPHOS 101  --   --   BILITOT 1.8*  --   --    < > = values in this interval not displayed.    Recent Labs  Lab 04/30/17 1556 04/30/17 2105 04/30/17  2345 05/01/17 0420 05/01/17 0742 05/01/17 0913  GLUCAP 69 121* 124* 164* 149* 148*   Recent Labs  Lab 04/25/17 1130 04/30/17 1446  PHART 7.39 7.28*  PCO2ART 39 52*  PO2ART 51* 127*   Recent Labs  Lab 04/26/17 0513 04/27/17 0414  AST 80* 71*  ALT 42 51  ALKPHOS 114 101  BILITOT 2.0* 1.8*  ALBUMIN 1.9* 1.9*    Cardiac Enzymes No results for input(s): TROPONINI in the last 168 hours.  RADIOLOGY:  Dg Abd 1 View  Result Date: 04/30/2017 CLINICAL DATA:  Nasal/orogastric tube placement. EXAM: ABDOMEN - 1 VIEW COMPARISON:  None. FINDINGS: Nasal/orogastric tube tip projects in the proximal stomach chest through the GE junction. Recommend further insertion, 10 cm, to allow the side hole of the tube to fully into the stomach. Normal bowel gas pattern. There are densities which project over the right kidney reflecting intrarenal stones noted on a CT dated  03/07/2016. IMPRESSION: 1. Nasal/orogastric tube tip passes chest through the GE junction. Recommend further insertion of 10 cm. 2. No acute findings in the abdomen. 3. Left intrarenal stones. Electronically Signed   By: Lajean Manes M.D.   On: 04/30/2017 13:35   US Venous Img Lower Bilateral  Result Date: 04/30/2017 CLINICAL DATA:  Bilateral lower extremity color changes.  Edema. EXAM: BILATERAL LOWER EXTREMITY VENOUS DOPPLER ULTRASOUND TECHNIQUE: Gray-scale sonography with graded compression, as well as color Doppler and duplex ultrasound were performed to evaluate the lower extremity deep venous systems from the level of the common femoral vein and including the common femoral, femoral, profunda femoral, popliteal and calf veins including the posterior tibial, peroneal and gastrocnemius veins when visible. The superficial great saphenous vein was also interrogated. Spectral Doppler was utilized to evaluate flow at rest and with distal augmentation maneuvers in the common femoral, femoral and popliteal veins. COMPARISON:  None. FINDINGS: RIGHT LOWER EXTREMITY Common Femoral Vein: No evidence of thrombus. Normal compressibility, respiratory phasicity and response to augmentation. Saphenofemoral Junction: No evidence of thrombus. Normal compressibility and flow on color Doppler imaging. Profunda Femoral Vein: No evidence of thrombus. Normal compressibility and flow on color Doppler imaging. Femoral Vein: No evidence of thrombus. Normal compressibility, respiratory phasicity and response to augmentation. Popliteal Vein: No evidence of thrombus. Normal compressibility, respiratory phasicity and response to augmentation. Calf Veins: Visualized right deep calf veins are patent without thrombus. LEFT LOWER EXTREMITY Common Femoral Vein: No evidence of thrombus. Normal compressibility, respiratory phasicity and response to augmentation. Saphenofemoral Junction: No evidence of thrombus. Normal compressibility and flow  on color Doppler imaging. Profunda Femoral Vein: No evidence of thrombus. Normal compressibility and flow on color Doppler imaging. Femoral Vein: No evidence of thrombus. Normal compressibility, respiratory phasicity and response to augmentation. Popliteal Vein: No evidence of thrombus. Normal compressibility, respiratory phasicity and response to augmentation. Calf Veins: Visualized left deep calf veins are patent without thrombus. IMPRESSION: No evidence of deep venous thrombosis in the lower extremities. Electronically Signed   By: Markus Daft M.D.   On: 04/30/2017 17:09   Dg Chest Port 1 View  Result Date: 05/01/2017 CLINICAL DATA:  Acute respiratory failure EXAM: PORTABLE CHEST 1 VIEW COMPARISON:  05/01/2007 FINDINGS: Endotracheal tube and NG tube are stable. Interval placement of left PICC line with the tip in the upper right atrium. Diffuse interstitial opacities throughout the lungs are stable. Cardiomegaly. Low lung volumes. No visible effusions or acute bony abnormality. IMPRESSION: Diffuse interstitial prominence throughout the lungs, stable. Cardiomegaly. Electronically Signed   By: Lennette Bihari  [place] M.D.   On: 05/01/2017 07:45   Dg Chest Port 1 View  Result Date: 04/30/2017 CLINICAL DATA:  Status post intubation and nasal/orogastric tube placement. EXAM: PORTABLE CHEST 1 VIEW COMPARISON:  04/28/2017 FINDINGS: The endotracheal tube tip projects 2.7 cm above the Carina, well positioned. Nasal/orogastric tube tip projects in the proximal stomach, with the side hole in the distal esophagus. Recommend further insertion of approximately 10 cm for more optimal positioning. Cardiac silhouette is normal in size. No mediastinal or hilar masses. Irregularly thickened bronchovascular interstitial markings are noted bilaterally. No new lung abnormalities. No convincing pleural effusion or pneumothorax. IMPRESSION: 1. Endotracheal tube is well positioned. 2. Nasal/orogastric tube tip projects in the proximal stomach.  Recommend further insertion of 10 cm to allow the side hole to fully into the stomach. 3. No change in the appearance of the lungs. Electronically Signed   By: Lajean Manes M.D.   On: 04/30/2017 13:34     Hermelinda Dellen, DO  05/01/2017

## 2017-05-01 NOTE — Progress Notes (Signed)
Anticoagulation monitoring(Lovenox):  61yo  F ordered Lovenox 40 mg Q24h  Filed Weights   04/29/17 0413 04/30/17 0435 05/01/17 0415  Weight: 176 lb 2.4 oz (79.9 kg) 156 lb 4.9 oz (70.9 kg) 161 lb 9.6 oz (73.3 kg)   BMI 27.7   Lab Results  Component Value Date   CREATININE 2.22 (H) 05/01/2017   CREATININE 2.08 (H) 04/30/2017   CREATININE 1.63 (H) 04/28/2017   Estimated Creatinine Clearance: 25.4 mL/min (A) (by C-G formula based on SCr of 2.22 mg/dL (H)). Hemoglobin & Hematocrit     Component Value Date/Time   HGB 7.7 (L) 05/01/2017 0458   HGB 7.7 (L) 06/21/2014 1110   HCT 25.3 (L) 05/01/2017 0458   HCT 22.5 (L) 06/15/2014 1447     Per Protocol for Patient with estCrcl < 30 ml/min and BMI < 40, will transition to Lovenox 30 mg Q24h.      Chinita Greenland PharmD Clinical Pharmacist 05/01/2017

## 2017-05-01 NOTE — Progress Notes (Signed)
At the request of the nurse, Chaplain visited with family to provide ministry of presence, prayer, and emotional support. Reminded family to page the chaplain, if needed.

## 2017-05-01 NOTE — Progress Notes (Signed)
[facility] [facility] [place] NAME: Samantha Mcmillan    MR#:  [id number]  DATE OF BIRTH:  [birth year]  SUBJECTIVE:  CHIEF COMPLAINT:   Chief Complaint  Patient presents with  . Hip Pain  . Fall  Patient became short of breath and was intubated 04/30/2017.  Patient son at bedside  REVIEW OF SYSTEMS:    ROS not obtainable as the patient is intubated  DRUG ALLERGIES:   Allergies  Allergen Reactions  . Iodine Shortness Of Breath  . Latex Anaphylaxis  . Other Rash, Other (See Comments), Shortness Of Breath, Anaphylaxis and Swelling    Pt states that she is allergic to Darvocet.   . Oxycodone Shortness Of Breath, Rash and Swelling    Other reaction(s): Other (See Comments) wheezing  . Oxycodone-Acetaminophen Other (See Comments), Rash, Swelling and Shortness Of Breath    wheezing wheezing  . Shellfish-Derived Products Anaphylaxis, Rash, Shortness Of Breath and Swelling    Throat closes.  . Amitriptyline Other (See Comments)    Other reaction(s): Other (See Comments) Mental changes Other reaction(s): Other (See Comments) Altered mental status, agitation Reaction:  Mental changes   . Fish-Derived Products Other (See Comments) and Swelling    Other reaction(s): Other (See Comments) "respiratory distress and wheezing" "respiratory distress and wheezing"  . Tape Other (See Comments)    Other reaction(s): Other (See Comments) Pulls skin off Pt states that it pulls her skin off.  Pt states that it pulls her skin off.   . Wellbutrin [Bupropion] Other (See Comments)    Other reaction(s): Other (See Comments) Mental changes Reaction:  Mental changes   . Augmentin [Amoxicillin-Pot Clavulanate] Rash and Other (See Comments)    Has patient had a PCN reaction causing immediate rash, facial/tongue/throat swelling, SOB or lightheadedness with hypotension: No Has patient had a PCN reaction causing severe rash involving mucus membranes or skin necrosis:  No Has patient had a PCN reaction that required hospitalization No Has patient had a PCN reaction occurring within the last 10 years: No If all of the above answers are "NO", then may proceed with Cephalosporin use.  . Codeine Rash  . Cortisone Rash  . Diphenhydramine Rash  . Hydrocodone-Acetaminophen Rash    dizzy  . Vicodin [Hydrocodone-Acetaminophen] Rash and Other (See Comments)    Reaction:  Dizziness      VITALS:  Blood pressure (!) 107/56, pulse 87, temperature (!) 97 F (36.1 C), temperature source Axillary, resp. rate (!) 22, height 5\' 4"  (1.626 m), weight 73.3 kg (161 lb 9.6 oz), SpO2 100 %.  PHYSICAL EXAMINATION:  GENERAL:  62 y.o.-year-old patient lying in the bed with no acute distress.  EYES: Pupils equal, round, reactive to light and accommodation. No scleral icterus.  HEENT: Head atraumatic, normocephalic. OT intact NECK:  Supple, no jugular venous distention. No thyroid enlargement, no tenderness.  LUNGS: Mod breath sounds bilaterally, no wheezing, rales,rhonchi or crepitation. No use of accessory muscles of respiration.  CARDIOVASCULAR: S1, S2 normal. No murmurs, rubs, or gallops.  ABDOMEN: Soft, nontender, nondistended. Bowel sounds present.  EXTREMITIES: No pedal edema, cyanosis, or clubbing.  NEUROLOGIC: Intubated PSYCHIATRIC: The patient is sedated  sKIN: No obvious rash, lesion, or ulcer.   Physical Exam LABORATORY PANEL:   CBC Recent Labs  Lab 05/01/17 0458  WBC 19.4*  HGB 7.7*  HCT 25.3*  PLT 117*   ------------------------------------------------------------------------------------------------------------------  Chemistries  Recent Labs  Lab 04/27/17 0414  05/01/17 0458  NA 140   < >  149*  K 3.8   < > 3.8  CL 106   < > 119*  CO2 21*   < > 20*  GLUCOSE 197*   < > 180*  BUN 73*   < > 94*  CREATININE 1.72*   < > 2.22*  CALCIUM 8.1*   < > 8.0*  MG  --    < > 2.4  AST 71*  --   --   ALT 51  --   --   ALKPHOS 101  --   --   BILITOT 1.8*   --   --    < > = values in this interval not displayed.   ------------------------------------------------------------------------------------------------------------------  Cardiac Enzymes No results for input(s): TROPONINI in the last 168 hours. ------------------------------------------------------------------------------------------------------------------  RADIOLOGY:  Dg Abd 1 View  Result Date: 04/30/2017 CLINICAL DATA:  Nasal/orogastric tube placement. EXAM: ABDOMEN - 1 VIEW COMPARISON:  None. FINDINGS: Nasal/orogastric tube tip projects in the proximal stomach chest through the GE junction. Recommend further insertion, 10 cm, to allow the side hole of the tube to fully into the stomach. Normal bowel gas pattern. There are densities which project over the right kidney reflecting intrarenal stones noted on a CT dated 03/07/2016. IMPRESSION: 1. Nasal/orogastric tube tip passes chest through the GE junction. Recommend further insertion of 10 cm. 2. No acute findings in the abdomen. 3. Left intrarenal stones. Electronically Signed   By: Lajean Manes M.D.   On: 04/30/2017 13:35   US Venous Img Lower Bilateral  Result Date: 04/30/2017 CLINICAL DATA:  Bilateral lower extremity color changes.  Edema. EXAM: BILATERAL LOWER EXTREMITY VENOUS DOPPLER ULTRASOUND TECHNIQUE: Gray-scale sonography with graded compression, as well as color Doppler and duplex ultrasound were performed to evaluate the lower extremity deep venous systems from the level of the common femoral vein and including the common femoral, femoral, profunda femoral, popliteal and calf veins including the posterior tibial, peroneal and gastrocnemius veins when visible. The superficial great saphenous vein was also interrogated. Spectral Doppler was utilized to evaluate flow at rest and with distal augmentation maneuvers in the common femoral, femoral and popliteal veins. COMPARISON:  None. FINDINGS: RIGHT LOWER EXTREMITY Common Femoral Vein:  No evidence of thrombus. Normal compressibility, respiratory phasicity and response to augmentation. Saphenofemoral Junction: No evidence of thrombus. Normal compressibility and flow on color Doppler imaging. Profunda Femoral Vein: No evidence of thrombus. Normal compressibility and flow on color Doppler imaging. Femoral Vein: No evidence of thrombus. Normal compressibility, respiratory phasicity and response to augmentation. Popliteal Vein: No evidence of thrombus. Normal compressibility, respiratory phasicity and response to augmentation. Calf Veins: Visualized right deep calf veins are patent without thrombus. LEFT LOWER EXTREMITY Common Femoral Vein: No evidence of thrombus. Normal compressibility, respiratory phasicity and response to augmentation. Saphenofemoral Junction: No evidence of thrombus. Normal compressibility and flow on color Doppler imaging. Profunda Femoral Vein: No evidence of thrombus. Normal compressibility and flow on color Doppler imaging. Femoral Vein: No evidence of thrombus. Normal compressibility, respiratory phasicity and response to augmentation. Popliteal Vein: No evidence of thrombus. Normal compressibility, respiratory phasicity and response to augmentation. Calf Veins: Visualized left deep calf veins are patent without thrombus. IMPRESSION: No evidence of deep venous thrombosis in the lower extremities. Electronically Signed   By: Markus Daft M.D.   On: 04/30/2017 17:09   Dg Chest Port 1 View  Result Date: 05/01/2017 CLINICAL DATA:  Acute respiratory failure EXAM: PORTABLE CHEST 1 VIEW COMPARISON:  05/01/2007 FINDINGS: Endotracheal tube and NG tube are stable.  Interval placement of left PICC line with the tip in the upper right atrium. Diffuse interstitial opacities throughout the lungs are stable. Cardiomegaly. Low lung volumes. No visible effusions or acute bony abnormality. IMPRESSION: Diffuse interstitial prominence throughout the lungs, stable. Cardiomegaly. Electronically  Signed   By: Rolm Baptise M.D.   On: 05/01/2017 07:45   Dg Chest Port 1 View  Result Date: 04/30/2017 CLINICAL DATA:  Status post intubation and nasal/orogastric tube placement. EXAM: PORTABLE CHEST 1 VIEW COMPARISON:  04/28/2017 FINDINGS: The endotracheal tube tip projects 2.7 cm above the Carina, well positioned. Nasal/orogastric tube tip projects in the proximal stomach, with the side hole in the distal esophagus. Recommend further insertion of approximately 10 cm for more optimal positioning. Cardiac silhouette is normal in size. No mediastinal or hilar masses. Irregularly thickened bronchovascular interstitial markings are noted bilaterally. No new lung abnormalities. No convincing pleural effusion or pneumothorax. IMPRESSION: 1. Endotracheal tube is well positioned. 2. Nasal/orogastric tube tip projects in the proximal stomach. Recommend further insertion of 10 cm to allow the side hole to fully into the stomach. 3. No change in the appearance of the lungs. Electronically Signed   By: Lajean Manes M.D.   On: 04/30/2017 13:34    ASSESSMENT AND PLAN:  62 yo female w/ hx of DM, HTN, Hyperlipidemia, hx of Cirrhosis, Chronic Bronchitis, Anxiety, Anemia, GERD, Depression, PTSD, Chronic Diastolic CHF, hx of Panic Attack who presented to the hospital due to a fall and noted to have a Left Hip Fracture.   1. Acute hypoxic respiratory failure-secondary to grown bronchospasm Intubated 3/8 Vent management per protocol Secondary to bilateralHCAP,COPD,and CHFexacerbation  CT chest negative for PE, noted extensive bilateral groundglass opacities with broad differential, elevated pro-calcitonin level  Continue BTs, IV Solu-Medrol, mucolytic agents,RTfollowing,  pulmonologyfollowing, echo for EF 30-35%, cardiology input appreciated, continueLasix, ST following  2.Acute on chronic diastolic CHF Acute hypoxia noted this morning April 24, 2017 requiringtransfer to ICU for closer monitoring,  transferred back to regular nursing floor bed on April 29, 2010,  was transferred back to the ICU on April 29, 2017 for acute respiratory failure/hypoxia,chest x-ray noted for edema versus pneumonia/repeat was unchanged, CT chest noted ContinueCHFprotocol, Lasix,Aldactone, metoprolol,echo noted above, cardiology input appreciated  Echo from 2016with normal ejection fraction  3.AcutebilateralHCAP Resolving Chest x-ray, CT chest noted Continue pneumonia protocol, empiricCefepime for 5-7 day course  4.Acute on COPD exacerbation Stable  ContinueIV Solu-Medrol with tapering as tolerated, inhaled corticosteroids, supplemental oxygenprn,mucolytic agents  5.S/p Fall and Left Hip Fracture s/pleft hip hemiarthroplastyby orthopedic surgery  6. Hxof gout Stable  Continue allopurinol  7.Hypertension,chronic Stable on current regiment  8.Chronic diabetes mellitus type 2 Stable on current regiment  9.Chronic tobacco smoking abuse/dependency Cessation counseling and nicotine patch daily  10.Noncompliant with medical management  Long-term prognosis is poor-palliative care input appreciated  All the records are reviewed and case discussed with Care Management/Social Workerr. Management plans discussed with the patient's son at bed side  CODE STATUS: full  TOTAL TIME TAKING CARE OF THIS PATIENT: 35 minutes.     POSSIBLE D/C IN 2-5 DAYS, DEPENDING ON CLINICAL CONDITION.   Nicholes Mango M.D on 05/01/2017   Between 7am to 6pm - Pager - [phone]  After 6pm go to [email or web address] - password [email or web address] [facility]  Office  [phone]  CC: Primary care physician; Casilda Carls, MD  Note: This dictation was prepared with Dragon dictation along with smaller phrase technology. Any transcriptional errors that result from this process are unintentional.

## 2017-05-02 ENCOUNTER — Inpatient Hospital Stay: Payer: Medicare Other

## 2017-05-02 LAB — CBC
HCT: 24.8 % — ABNORMAL LOW (ref 35.0–47.0)
Hemoglobin: 7.5 g/dL — ABNORMAL LOW (ref 12.0–16.0)
MCH: 30.5 pg (ref 26.0–34.0)
MCHC: 30.4 g/dL — ABNORMAL LOW (ref 32.0–36.0)
MCV: 100.5 fL — AB (ref 80.0–100.0)
Platelets: 66 10*3/uL — ABNORMAL LOW (ref 150–440)
RBC: 2.47 MIL/uL — AB (ref 3.80–5.20)
RDW: 17.3 % — ABNORMAL HIGH (ref 11.5–14.5)
WBC: 9.4 10*3/uL (ref 3.6–11.0)

## 2017-05-02 LAB — AMMONIA: Ammonia: 31 umol/L (ref 9–35)

## 2017-05-02 LAB — BASIC METABOLIC PANEL
Anion gap: 9 (ref 5–15)
BUN: 95 mg/dL — AB (ref 6–20)
CHLORIDE: 125 mmol/L — AB (ref 101–111)
CO2: 20 mmol/L — AB (ref 22–32)
Calcium: 8.3 mg/dL — ABNORMAL LOW (ref 8.9–10.3)
Creatinine, Ser: 2.27 mg/dL — ABNORMAL HIGH (ref 0.44–1.00)
GFR calc Af Amer: 25 mL/min — ABNORMAL LOW (ref >60)
GFR calc non Af Amer: 22 mL/min — ABNORMAL LOW (ref >60)
GLUCOSE: 193 mg/dL — AB (ref 65–99)
POTASSIUM: 3.4 mmol/L — AB (ref 3.5–5.1)
Sodium: 154 mmol/L — ABNORMAL HIGH (ref 135–145)

## 2017-05-02 LAB — GLUCOSE, CAPILLARY
GLUCOSE-CAPILLARY: 179 mg/dL — AB (ref 65–99)
GLUCOSE-CAPILLARY: 196 mg/dL — AB (ref 65–99)
Glucose-Capillary: 126 mg/dL — ABNORMAL HIGH (ref 65–99)
Glucose-Capillary: 163 mg/dL — ABNORMAL HIGH (ref 65–99)
Glucose-Capillary: 171 mg/dL — ABNORMAL HIGH (ref 65–99)
Glucose-Capillary: 175 mg/dL — ABNORMAL HIGH (ref 65–99)
Glucose-Capillary: 185 mg/dL — ABNORMAL HIGH (ref 65–99)

## 2017-05-02 LAB — MAGNESIUM: Magnesium: 2.5 mg/dL — ABNORMAL HIGH (ref 1.7–2.4)

## 2017-05-02 LAB — PHOSPHORUS: PHOSPHORUS: 4.5 mg/dL (ref 2.5–4.6)

## 2017-05-02 MED ORDER — INSULIN GLARGINE 100 UNIT/ML ~~LOC~~ SOLN
25.0000 [IU] | Freq: Every day | SUBCUTANEOUS | Status: DC
  Administered 2017-05-03: 25 [IU] via SUBCUTANEOUS
  Filled 2017-05-02 (×2): qty 0.25

## 2017-05-02 MED ORDER — LACTULOSE 10 GM/15ML PO SOLN
30.0000 g | Freq: Three times a day (TID) | ORAL | Status: DC
  Administered 2017-05-02 – 2017-05-03 (×5): 30 g
  Filled 2017-05-02 (×5): qty 60

## 2017-05-02 MED ORDER — ADULT MULTIVITAMIN W/MINERALS CH
1.0000 | ORAL_TABLET | Freq: Every day | ORAL | Status: DC
  Administered 2017-05-02 – 2017-05-03 (×2): 1
  Filled 2017-05-02 (×2): qty 1

## 2017-05-02 MED ORDER — POTASSIUM CHLORIDE 10 MEQ/100ML IV SOLN
10.0000 meq | INTRAVENOUS | Status: AC
  Administered 2017-05-02 (×2): 10 meq via INTRAVENOUS
  Filled 2017-05-02 (×2): qty 100

## 2017-05-02 MED ORDER — ADULT MULTIVITAMIN LIQUID CH
15.0000 mL | Freq: Every day | ORAL | Status: DC
  Filled 2017-05-02: qty 15

## 2017-05-02 MED ORDER — VITAL HIGH PROTEIN PO LIQD
1000.0000 mL | ORAL | Status: DC
  Administered 2017-05-02: 1000 mL

## 2017-05-02 NOTE — Progress Notes (Signed)
Pharmacy ICU Medication Monitoring Consult:  Pharmacy consulted to assist in monitoring and replacing electrolytes in this 62 y.o. female admitted on 04/16/2017 with Hip Pain and Fall  Patient currently requiring mechanical ventilation.   Labs:  Sodium (mmol/L)  Date Value  05/02/2017 154 (H)  05/31/2014 128 (A)  03/15/2014 136   Potassium (mmol/L)  Date Value  05/02/2017 3.4 (L)  03/15/2014 3.1 (L)   Magnesium (mg/dL)  Date Value  05/01/2017 2.4  03/15/2014 1.6 (L)   Phosphorus (mg/dL)  Date Value  05/01/2017 4.6   Calcium (mg/dL)  Date Value  05/02/2017 8.3 (L)   Calcium, Total (mg/dL)  Date Value  03/15/2014 7.7 (L)   Albumin (g/dL)  Date Value  04/27/2017 1.9 (L)  03/15/2014 2.1 (L)    Plan:  1. Electrolytes: K 3.4, Na 154.  Will order Potassium chloride 20 meq IV x 1 (10 meq IV x 2 bags).  Patient on lasix 20 IV daily  2. Glucose: Continue SSI Q4hr , current order for Lantus 25 units BID  3. Constipation:On lactulose 39mL TID. Will order follow up ammonia for 3/10.   Pharmacy will continue to monitor and adjust per consult.   Tyren Dugar A 05/02/2017 8:16 AM

## 2017-05-02 NOTE — Progress Notes (Signed)
Placed pt. Back on prvc mode due acidotic ph on abg while on psv mode.

## 2017-05-02 NOTE — Progress Notes (Signed)
[facility] [place] NAME: Janashia Parco    MR#:  [id number]  DATE OF BIRTH:  10-30-1955  SUBJECTIVE:  CHIEF COMPLAINT:   Chief Complaint  Patient presents with  . Hip Pain  . Fall  Patient became short of breath and was intubated 04/30/2017.  Patient'S ex-husband who is also caregiver at bedside.  Pulmonology is going to do weaning trials today  REVIEW OF SYSTEMS:    ROS not obtainable as the patient is intubated  DRUG ALLERGIES:   Allergies  Allergen Reactions  . Iodine Shortness Of Breath  . Latex Anaphylaxis  . Other Rash, Other (See Comments), Shortness Of Breath, Anaphylaxis and Swelling    Pt states that she is allergic to Darvocet.   . Oxycodone Shortness Of Breath, Rash and Swelling    Other reaction(s): Other (See Comments) wheezing  . Oxycodone-Acetaminophen Other (See Comments), Rash, Swelling and Shortness Of Breath    wheezing wheezing  . Shellfish-Derived Products Anaphylaxis, Rash, Shortness Of Breath and Swelling    Throat closes.  . Amitriptyline Other (See Comments)    Other reaction(s): Other (See Comments) Mental changes Other reaction(s): Other (See Comments) Altered mental status, agitation Reaction:  Mental changes   . Fish-Derived Products Other (See Comments) and Swelling    Other reaction(s): Other (See Comments) "respiratory distress and wheezing" "respiratory distress and wheezing"  . Tape Other (See Comments)    Other reaction(s): Other (See Comments) Pulls skin off Pt states that it pulls her skin off.  Pt states that it pulls her skin off.   . Wellbutrin [Bupropion] Other (See Comments)    Other reaction(s): Other (See Comments) Mental changes Reaction:  Mental changes   . Augmentin [Amoxicillin-Pot Clavulanate] Rash and Other (See Comments)    Has patient had a PCN reaction causing immediate rash, facial/tongue/throat swelling, SOB or lightheadedness with hypotension: No Has patient had  a PCN reaction causing severe rash involving mucus membranes or skin necrosis: No Has patient had a PCN reaction that required hospitalization No Has patient had a PCN reaction occurring within the last 10 years: No If all of the above answers are "NO", then may proceed with Cephalosporin use.  . Codeine Rash  . Cortisone Rash  . Diphenhydramine Rash  . Hydrocodone-Acetaminophen Rash    dizzy  . Vicodin [Hydrocodone-Acetaminophen] Rash and Other (See Comments)    Reaction:  Dizziness      VITALS:  Blood pressure (!) 110/54, pulse 75, temperature (!) 97.3 F (36.3 C), temperature source Axillary, resp. rate 20, height 5\' 4"  (1.626 m), weight 73.3 kg (161 lb 9.6 oz), SpO2 100 %.  PHYSICAL EXAMINATION:  GENERAL:  62 y.o.-year-old patient lying in the bed with no acute distress.  EYES: Pupils equal, round, reactive to light and accommodation. No scleral icterus.  HEENT: Head atraumatic, normocephalic. OT intact NECK:  Supple, no jugular venous distention. No thyroid enlargement, no tenderness.  LUNGS: Mod breath sounds bilaterally, no wheezing, rales,rhonchi or crepitation. No use of accessory muscles of respiration.  CARDIOVASCULAR: S1, S2 normal. No murmurs, rubs, or gallops.  ABDOMEN: Soft, nontender, nondistended. Bowel sounds present.  EXTREMITIES: No pedal edema, cyanosis, or clubbing.  NEUROLOGIC: Intubated PSYCHIATRIC: The patient is sedated  sKIN: No obvious rash, lesion, or ulcer.   Physical Exam LABORATORY PANEL:   CBC Recent Labs  Lab 05/02/17 0731  WBC 9.4  HGB 7.5*  HCT 24.8*  PLT 66*   ------------------------------------------------------------------------------------------------------------------  Chemistries  Recent Labs  Lab 04/27/17 0414  05/01/17 0458 05/02/17 0731  NA 140   < > 149* 154*  K 3.8   < > 3.8 3.4*  CL 106   < > 119* 125*  CO2 21*   < > 20* 20*  GLUCOSE 197*   < > 180* 193*  BUN 73*   < > 94* 95*  CREATININE 1.72*   < > 2.22* 2.27*   CALCIUM 8.1*   < > 8.0* 8.3*  MG  --    < > 2.4  --   AST 71*  --   --   --   ALT 51  --   --   --   ALKPHOS 101  --   --   --   BILITOT 1.8*  --   --   --    < > = values in this interval not displayed.   ------------------------------------------------------------------------------------------------------------------  Cardiac Enzymes No results for input(s): TROPONINI in the last 168 hours. ------------------------------------------------------------------------------------------------------------------  RADIOLOGY:  Dg Abd 1 View  Result Date: 04/30/2017 CLINICAL DATA:  Nasal/orogastric tube placement. EXAM: ABDOMEN - 1 VIEW COMPARISON:  None. FINDINGS: Nasal/orogastric tube tip projects in the proximal stomach chest through the GE junction. Recommend further insertion, 10 cm, to allow the side hole of the tube to fully into the stomach. Normal bowel gas pattern. There are densities which project over the right kidney reflecting intrarenal stones noted on a CT dated 03/07/2016. IMPRESSION: 1. Nasal/orogastric tube tip passes chest through the GE junction. Recommend further insertion of 10 cm. 2. No acute findings in the abdomen. 3. Left intrarenal stones. Electronically Signed   By: Lajean Manes M.D.   On: 04/30/2017 13:35   US Venous Img Lower Bilateral  Result Date: 04/30/2017 CLINICAL DATA:  Bilateral lower extremity color changes.  Edema. EXAM: BILATERAL LOWER EXTREMITY VENOUS DOPPLER ULTRASOUND TECHNIQUE: Gray-scale sonography with graded compression, as well as color Doppler and duplex ultrasound were performed to evaluate the lower extremity deep venous systems from the level of the common femoral vein and including the common femoral, femoral, profunda femoral, popliteal and calf veins including the posterior tibial, peroneal and gastrocnemius veins when visible. The superficial great saphenous vein was also interrogated. Spectral Doppler was utilized to evaluate flow at rest and with  distal augmentation maneuvers in the common femoral, femoral and popliteal veins. COMPARISON:  None. FINDINGS: RIGHT LOWER EXTREMITY Common Femoral Vein: No evidence of thrombus. Normal compressibility, respiratory phasicity and response to augmentation. Saphenofemoral Junction: No evidence of thrombus. Normal compressibility and flow on color Doppler imaging. Profunda Femoral Vein: No evidence of thrombus. Normal compressibility and flow on color Doppler imaging. Femoral Vein: No evidence of thrombus. Normal compressibility, respiratory phasicity and response to augmentation. Popliteal Vein: No evidence of thrombus. Normal compressibility, respiratory phasicity and response to augmentation. Calf Veins: Visualized right deep calf veins are patent without thrombus. LEFT LOWER EXTREMITY Common Femoral Vein: No evidence of thrombus. Normal compressibility, respiratory phasicity and response to augmentation. Saphenofemoral Junction: No evidence of thrombus. Normal compressibility and flow on color Doppler imaging. Profunda Femoral Vein: No evidence of thrombus. Normal compressibility and flow on color Doppler imaging. Femoral Vein: No evidence of thrombus. Normal compressibility, respiratory phasicity and response to augmentation. Popliteal Vein: No evidence of thrombus. Normal compressibility, respiratory phasicity and response to augmentation. Calf Veins: Visualized left deep calf veins are patent without thrombus. IMPRESSION: No evidence of deep venous thrombosis in the lower extremities. Electronically Signed   By: Scherrie Gerlach.D.  On: 04/30/2017 17:09   Dg Chest Port 1 View  Result Date: 05/02/2017 CLINICAL DATA:  Ventilator EXAM: PORTABLE CHEST 1 VIEW COMPARISON:  05/01/2017 FINDINGS: Endotracheal tube, left PICC line and NG tube are unchanged. Heart is upper limits normal in size. Continued interstitial prominence in the lungs, right greater than left, unchanged. No effusions or acute bony abnormality.  IMPRESSION: Stable interstitial prominence within the lungs, right greater than left. Electronically Signed   By: Rolm Baptise M.D.   On: 05/02/2017 09:59   Dg Chest Port 1 View  Result Date: 05/01/2017 CLINICAL DATA:  Acute respiratory failure EXAM: PORTABLE CHEST 1 VIEW COMPARISON:  05/01/2007 FINDINGS: Endotracheal tube and NG tube are stable. Interval placement of left PICC line with the tip in the upper right atrium. Diffuse interstitial opacities throughout the lungs are stable. Cardiomegaly. Low lung volumes. No visible effusions or acute bony abnormality. IMPRESSION: Diffuse interstitial prominence throughout the lungs, stable. Cardiomegaly. Electronically Signed   By: Rolm Baptise M.D.   On: 05/01/2017 07:45   Dg Chest Port 1 View  Result Date: 04/30/2017 CLINICAL DATA:  Status post intubation and nasal/orogastric tube placement. EXAM: PORTABLE CHEST 1 VIEW COMPARISON:  04/28/2017 FINDINGS: The endotracheal tube tip projects 2.7 cm above the Carina, well positioned. Nasal/orogastric tube tip projects in the proximal stomach, with the side hole in the distal esophagus. Recommend further insertion of approximately 10 cm for more optimal positioning. Cardiac silhouette is normal in size. No mediastinal or hilar masses. Irregularly thickened bronchovascular interstitial markings are noted bilaterally. No new lung abnormalities. No convincing pleural effusion or pneumothorax. IMPRESSION: 1. Endotracheal tube is well positioned. 2. Nasal/orogastric tube tip projects in the proximal stomach. Recommend further insertion of 10 cm to allow the side hole to fully into the stomach. 3. No change in the appearance of the lungs. Electronically Signed   By: Lajean Manes M.D.   On: 04/30/2017 13:34    ASSESSMENT AND PLAN:  62 yo female w/ hx of DM, HTN, Hyperlipidemia, hx of Cirrhosis, Chronic Bronchitis, Anxiety, Anemia, GERD, Depression, PTSD, Chronic Diastolic CHF, hx of Panic Attack who presented to the  hospital due to a fall and noted to have a Left Hip Fracture.   1. Acute hypoxic respiratory failure-secondary to grown bronchospasm Intubated 3/8 Vent management per protocol, weaning trials as tolerated today by pulmonology Secondary to bilateralHCAP,COPD,and CHFexacerbation  CT chest negative for PE, noted extensive bilateral groundglass opacities with broad differential, elevated pro-calcitonin level  Continue BTs, IV Solu-Medrol, mucolytic agents,RTfollowing,  pulmonologyfollowing, echo for EF 30-35%, cardiology input appreciated, continueLasix, ST following  2.Acute on chronic diastolic CHF with hypokalemia Replete electrolites and repeat BMP in a.m. Acute hypoxia noted this morning April 24, 2017 requiringtransfer to ICU for closer monitoring, transferred back to regular nursing floor bed on April 29, 2010,  was transferred back to the ICU on April 29, 2017 for acute respiratory failure/hypoxia,chest x-ray noted for edema versus pneumonia/repeat was unchanged, CT chest noted ContinueCHFprotocol, Lasix,Aldactone, metoprolol,echo noted above, cardiology input appreciated  Echo from 2016with normal ejection fraction  3.AcutebilateralHCAP Resolving Chest x-ray, CT chest noted Continue pneumonia protocol, empiricCefepime for 5-7 day course  4.Acute on COPD exacerbation Stable  ContinueIV Solu-Medrol with tapering as tolerated, inhaled corticosteroids, supplemental oxygenprn,mucolytic agents  5.S/p Fall and Left Hip Fracture s/pleft hip hemiarthroplastyby orthopedic surgery  6. Hxof gout Stable  Continue allopurinol  7.Hypertension,chronic Stable on current regiment  8.Chronic diabetes mellitus type 2 Stable on current regiment  9.Chronic tobacco smoking abuse/dependency Cessation  counseling and nicotine patch daily  10.Noncompliant with medical management  Long-term prognosis is poor-palliative care input  appreciated Discussed with pulmonology  All the records are reviewed and case discussed with Care Management/Social Workerr. Management plans discussed with the patient's x-husband at bed side  CODE STATUS: full  TOTAL TIME TAKING CARE OF THIS PATIENT: 35 minutes.     POSSIBLE D/C IN 2-5 DAYS, DEPENDING ON CLINICAL CONDITION.   Nicholes Mango M.D on 05/02/2017   Between 7am to 6pm - Pager - [phone]  After 6pm go to [email or web address] - password [email or web address] [facility]  Office  [phone]  CC: Primary care physician; Casilda Carls, MD  Note: This dictation was prepared with Dragon dictation along with smaller phrase technology. Any transcriptional errors that result from this process are unintentional.

## 2017-05-02 NOTE — Progress Notes (Addendum)
Nutrition Follow-up  DOCUMENTATION CODES:   Non-severe (moderate) malnutrition in context of chronic illness  INTERVENTION:  Initiate Vital High Protein at 15 mL/hr via OGT and advance by 20 mL every 8 hours to goal rate of Vital High Protein at 55 mL/hr (1320 mL goal daily volume). Provides 1320 kcal, 116 grams of protein, 1109 mL H2O daily.  Provide liquid MVI daily per tube as goal tube feeding regimen does not meet 100% RDIs for vitamins/minerals.  Continue vitamin C 500 mg BID.  Monitor magnesium, potassium, and phosphorus twice daily for 4 occurences, MD to replete as needed, as pt is at risk for refeeding syndrome given inadequate oral intake for 12 days.  NUTRITION DIAGNOSIS:   Moderate Malnutrition related to chronic illness(cirrhosis, COPD) as evidenced by mild fat depletion, mild muscle depletion, moderate muscle depletion.  Ongoing.  GOAL:   Patient will meet greater than or equal to 90% of their needs  Not met - addressing with initiation of tube feeds.  MONITOR:   Diet advancement, Supplement acceptance, Labs, Weight trends, Skin, I & O's  REASON FOR ASSESSMENT:   Consult, Ventilator Assessment of nutrition requirement/status  ASSESSMENT:   62 year old female with PMHx of GERD, cirrhosis, HTN, depression, COPD, IBS, chronic bronchitis, DM type 2, iron deficiency anemia, anxiety, CHF, OCD, panic attacks, PTSD who presented after fall found to have left hip fracture now s/p left hemiarthroplasty 2/27, also having acute hypoxic respiratory failure from acute on chronic CHF exacerbation, acute exacerbation of COPD, pulmonary edema, and PNA.   -On 3/7 patient was transferred to stepdown unit with increased work of breathing and lethargy. -She was made NPO on 3/8 and calorie count was cancelled. -She was emergently intubated on 3/8 in setting of respiratory failure. -Rectal tube was removed on 3/9 and then replaced later on 3/9.  Family intubated and sedated.  Rectal tube in place. Patient has allergy listed to fish-derived products so cannot use Vital AF 1.2 Cal.  Access: 18 Fr. OGT placed 3/8; per MD terminates in stomach on chest x-ray from today (has been advanced since abdominal x-ray 3/8); 55 cm at corner of mouth after being advanced 10 cm  MAP: 61-95 mmHg  Patient is currently intubated on ventilator support MV: 7.9 L/min Temp (24hrs), Avg:97.4 F (36.3 C), Min:97.2 F (36.2 C), Max:97.6 F (36.4 C)  Propofol: N/A  Medications reviewed and include: Lasix 20 mg daily IV, Novolog 0-9 units Q4hrs, Lantus 25 units BID, lactulose 30 grams TID, Solu-Medrol 40 mg Q12hrs IV, pantoprazole, vitamin C 500 mg BID, fentanyl gtt, LR @ 40 mL/hr, phenylephrine gtt at 80 mcg/min (dose decreasing/stable), vasopressin gtt at 0.04 units/min.  Labs reviewed: CBG 126-175 past 24 hrs, Sodium 154, Potassium 3.4, Chloride 125, CO2 20, BUN 95, Creatinine 2.27, Ammonia now 31. Last phosphorus was 4.6 on 3/9.  I/O: 1080 mL UOP yesterday (0.6 mL/kg/hr); 700 mL output in rectal tube yesterday  Weight trend: 73.3 kg on 3/9; + 2.1 kg from admission  Discussed with RN and MD. As patient was not ready for extubation today okay to start tube feeds.  Diet Order:  Aspiration precautions Diet NPO time specified  EDUCATION NEEDS:   Not appropriate for education at this time  Skin:  Skin Assessment: Skin Integrity Issues: Skin Integrity Issues:: Incisions, Other (Comment) Incisions: closed incision left hip Other: MSAD to abdomen and groin; ecchymosis to bilateral arms and legs  Last BM:  05/02/2017 - large type 7 in rectal tube  Height:  Ht Readings from Last 1 Encounters:  04/25/17 _0  (1.626 m)    Weight:   Wt Readings from Last 1 Encounters:  05/01/17 161 lb 9.6 oz (73.3 kg)    Ideal Body Weight:  54.5 kg  BMI:  Body mass index is 27.74 kg/m.  Estimated Nutritional Needs:   Kcal:  1318 (PSU 2003b w/ MSJ 1257, Ve 7.9, Tmax 36.4)  Protein:   92-115 grams (1.3-1.6 grams/kg)  Fluid:  1.6 L/day (30 mL/kg IBW)  Willey Blade, MS, RD, LDN Office: [phone] Pager: [phone] After Hours/Weekend Pager: [phone]

## 2017-05-02 NOTE — Progress Notes (Signed)
[facility] [place] Critical Care Medicine Progess Note    SYNOPSIS   [name] is a 62 year old female with past medical history remarkable for COPD, status post hip fracture with left hip hemiarthroplasty with acute on chronic respiratory failure with superimposed pulmonary edema was transferred to the intensive care unit.   ASSESSMENT/PLAN   Respiratory failure.  Patient's bronchospasm is improved.  On exam some bronchial breath sounds noted on the right side along with some atelectasis and bronchial breath sounds.  Continue albuterol, Atrovent, Solu-Medrol, cefepime.  Will attempt spontaneous awakening and breathing trial today  Renal insufficiency.95 / 2.27  Leukocytosis. 19.4 on broad-spectrum coverage  Anemia. Hemoglobin 7.7 will follow closely  Status post repair of hip fracture  Critical care time 35 minutes   VENTILATOR SETTINGS: Vent Mode: PRVC FiO2 (%):  [28 %] 28 % Set Rate:  [20 bmp] 20 bmp Vt Set:  [500 mL] 500 mL PEEP:  [5 cmH20] 5 cmH20  INTAKE / OUTPUT:  Intake/Output Summary (Last 24 hours) at 05/02/2017 0901 Last data filed at 05/02/2017 2585 Gross per 24 hour  Intake 1716.69 ml  Output 1780 ml  Net -63.31 ml    Name: Samantha Mcmillan MRN: [id number] DOB: [birth year]    ADMISSION DATE:  03/31/2017  SUBJECTIVE:   Pt currently on the ventilator, can not provide history or review of systems.   VITAL SIGNS: Temp:  [97 F (36.1 C)-97.6 F (36.4 C)] 97.3 F (36.3 C) (03/10 0430) Pulse Rate:  [65-97] 74 (03/10 0700) Resp:  [16-23] 20 (03/10 0700) BP: (88-120)/(48-73) 99/51 (03/10 0700) SpO2:  [100 %] 100 % (03/10 0803) FiO2 (%):  [28 %] 28 % (03/10 0803)  PHYSICAL EXAMINATION: Physical Examination:   VS: BP (!) 99/51   Pulse 74   Temp (!) 97.3 F (36.3 C) (Axillary)   Resp 20   Ht 5\' 4"  (1.626 m)   Wt 73.3 kg (161 lb 9.6 oz)   LMP  (LMP Unknown)   SpO2 100%   BMI 27.74 kg/m   General Appearance: patient is sedated on mechanical  ventilation Neuro:Limited exam HEENT: patient is orally intubated, oral gastric tube in place, trachea is midline Pulmonary: diffuse bronchospasm appreciated on exam CardiovascularNormal S1,S2.   Abdomen: positive bowel sounds, soft exam. Skin:   warm, no rashes, no ecchymosis  Extremities: normal, no cyanosis, clubbing.    LABORATORY PANEL:   CBC Recent Labs  Lab 05/01/17 0458  WBC 19.4*  HGB 7.7*  HCT 25.3*  PLT 117*    Chemistries  Recent Labs  Lab 04/27/17 0414  05/01/17 0458 05/02/17 0731  NA 140   < > 149* 154*  K 3.8   < > 3.8 3.4*  CL 106   < > 119* 125*  CO2 21*   < > 20* 20*  GLUCOSE 197*   < > 180* 193*  BUN 73*   < > 94* 95*  CREATININE 1.72*   < > 2.22* 2.27*  CALCIUM 8.1*   < > 8.0* 8.3*  MG  --    < > 2.4  --   PHOS  --    < > 4.6  --   AST 71*  --   --   --   ALT 51  --   --   --   ALKPHOS 101  --   --   --   BILITOT 1.8*  --   --   --    < > = values in this interval not  displayed.    Recent Labs  Lab 05/01/17 1542 05/01/17 2047 05/01/17 2246 05/02/17 0048 05/02/17 0350 05/02/17 0750  GLUCAP 161* 165* 163* 163* 175* 126*   Recent Labs  Lab 04/25/17 1130 04/30/17 1446  PHART 7.39 7.28*  PCO2ART 39 52*  PO2ART 51* 127*   Recent Labs  Lab 04/26/17 0513 04/27/17 0414  AST 80* 71*  ALT 42 51  ALKPHOS 114 101  BILITOT 2.0* 1.8*  ALBUMIN 1.9* 1.9*    Cardiac Enzymes No results for input(s): TROPONINI in the last 168 hours.  RADIOLOGY:  Dg Abd 1 View  Result Date: 04/30/2017 CLINICAL DATA:  Nasal/orogastric tube placement. EXAM: ABDOMEN - 1 VIEW COMPARISON:  None. FINDINGS: Nasal/orogastric tube tip projects in the proximal stomach chest through the GE junction. Recommend further insertion, 10 cm, to allow the side hole of the tube to fully into the stomach. Normal bowel gas pattern. There are densities which project over the right kidney reflecting intrarenal stones noted on a CT dated 03/07/2016. IMPRESSION: 1.  Nasal/orogastric tube tip passes chest through the GE junction. Recommend further insertion of 10 cm. 2. No acute findings in the abdomen. 3. Left intrarenal stones. Electronically Signed   By: Lajean Manes M.D.   On: 04/30/2017 13:35   US Venous Img Lower Bilateral  Result Date: 04/30/2017 CLINICAL DATA:  Bilateral lower extremity color changes.  Edema. EXAM: BILATERAL LOWER EXTREMITY VENOUS DOPPLER ULTRASOUND TECHNIQUE: Gray-scale sonography with graded compression, as well as color Doppler and duplex ultrasound were performed to evaluate the lower extremity deep venous systems from the level of the common femoral vein and including the common femoral, femoral, profunda femoral, popliteal and calf veins including the posterior tibial, peroneal and gastrocnemius veins when visible. The superficial great saphenous vein was also interrogated. Spectral Doppler was utilized to evaluate flow at rest and with distal augmentation maneuvers in the common femoral, femoral and popliteal veins. COMPARISON:  None. FINDINGS: RIGHT LOWER EXTREMITY Common Femoral Vein: No evidence of thrombus. Normal compressibility, respiratory phasicity and response to augmentation. Saphenofemoral Junction: No evidence of thrombus. Normal compressibility and flow on color Doppler imaging. Profunda Femoral Vein: No evidence of thrombus. Normal compressibility and flow on color Doppler imaging. Femoral Vein: No evidence of thrombus. Normal compressibility, respiratory phasicity and response to augmentation. Popliteal Vein: No evidence of thrombus. Normal compressibility, respiratory phasicity and response to augmentation. Calf Veins: Visualized right deep calf veins are patent without thrombus. LEFT LOWER EXTREMITY Common Femoral Vein: No evidence of thrombus. Normal compressibility, respiratory phasicity and response to augmentation. Saphenofemoral Junction: No evidence of thrombus. Normal compressibility and flow on color Doppler imaging.  Profunda Femoral Vein: No evidence of thrombus. Normal compressibility and flow on color Doppler imaging. Femoral Vein: No evidence of thrombus. Normal compressibility, respiratory phasicity and response to augmentation. Popliteal Vein: No evidence of thrombus. Normal compressibility, respiratory phasicity and response to augmentation. Calf Veins: Visualized left deep calf veins are patent without thrombus. IMPRESSION: No evidence of deep venous thrombosis in the lower extremities. Electronically Signed   By: Markus Daft M.D.   On: 04/30/2017 17:09   Dg Chest Port 1 View  Result Date: 05/01/2017 CLINICAL DATA:  Acute respiratory failure EXAM: PORTABLE CHEST 1 VIEW COMPARISON:  05/01/2007 FINDINGS: Endotracheal tube and NG tube are stable. Interval placement of left PICC line with the tip in the upper right atrium. Diffuse interstitial opacities throughout the lungs are stable. Cardiomegaly. Low lung volumes. No visible effusions or acute bony abnormality. IMPRESSION: Diffuse interstitial  prominence throughout the lungs, stable. Cardiomegaly. Electronically Signed   By: Rolm Baptise M.D.   On: 05/01/2017 07:45   Dg Chest Port 1 View  Result Date: 04/30/2017 CLINICAL DATA:  Status post intubation and nasal/orogastric tube placement. EXAM: PORTABLE CHEST 1 VIEW COMPARISON:  04/28/2017 FINDINGS: The endotracheal tube tip projects 2.7 cm above the [place], well positioned. Nasal/orogastric tube tip projects in the proximal stomach, with the side hole in the distal esophagus. Recommend further insertion of approximately 10 cm for more optimal positioning. Cardiac silhouette is normal in size. No mediastinal or hilar masses. Irregularly thickened bronchovascular interstitial markings are noted bilaterally. No new lung abnormalities. No convincing pleural effusion or pneumothorax. IMPRESSION: 1. Endotracheal tube is well positioned. 2. Nasal/orogastric tube tip projects in the proximal stomach. Recommend further  insertion of 10 cm to allow the side hole to fully into the stomach. 3. No change in the appearance of the lungs. Electronically Signed   By: Lajean Manes M.D.   On: 04/30/2017 13:34     Hermelinda Dellen, DO  05/02/2017

## 2017-05-03 ENCOUNTER — Inpatient Hospital Stay: Payer: Medicare Other

## 2017-05-03 LAB — BLOOD GAS, ARTERIAL
ACID-BASE DEFICIT: 6.6 mmol/L — AB (ref 0.0–2.0)
Acid-base deficit: 8.5 mmol/L — ABNORMAL HIGH (ref 0.0–2.0)
BICARBONATE: 19.7 mmol/L — AB (ref 20.0–28.0)
BICARBONATE: 17.9 mmol/L — AB (ref 20.0–28.0)
FIO2: 0.28
FIO2: 0.28
Mode: POSITIVE
O2 SAT: 86.2 %
O2 Saturation: 83.7 %
PATIENT TEMPERATURE: 37
PCO2 ART: 42 mmHg (ref 32.0–48.0)
PEEP: 5 cmH2O
PEEP: 5 cmH2O
PH ART: 7.28 — AB (ref 7.350–7.450)
PO2 ART: 55 mmHg — AB (ref 83.0–108.0)
PO2 ART: 60 mmHg — AB (ref 83.0–108.0)
PRESSURE SUPPORT: 5 cmH2O
Patient temperature: 37
Pressure support: 5 cmH2O
pCO2 arterial: 40 mmHg (ref 32.0–48.0)
pH, Arterial: 7.26 — ABNORMAL LOW (ref 7.350–7.450)

## 2017-05-03 LAB — GLUCOSE, CAPILLARY
GLUCOSE-CAPILLARY: 276 mg/dL — AB (ref 65–99)
GLUCOSE-CAPILLARY: 301 mg/dL — AB (ref 65–99)
Glucose-Capillary: 209 mg/dL — ABNORMAL HIGH (ref 65–99)
Glucose-Capillary: 219 mg/dL — ABNORMAL HIGH (ref 65–99)
Glucose-Capillary: 231 mg/dL — ABNORMAL HIGH (ref 65–99)
Glucose-Capillary: 287 mg/dL — ABNORMAL HIGH (ref 65–99)

## 2017-05-03 LAB — BASIC METABOLIC PANEL
ANION GAP: 12 (ref 5–15)
BUN: 100 mg/dL — ABNORMAL HIGH (ref 6–20)
CALCIUM: 8.3 mg/dL — AB (ref 8.9–10.3)
CO2: 17 mmol/L — AB (ref 22–32)
CREATININE: 2.54 mg/dL — AB (ref 0.44–1.00)
Chloride: 127 mmol/L — ABNORMAL HIGH (ref 101–111)
GFR, EST AFRICAN AMERICAN: 22 mL/min — AB (ref >60)
GFR, EST NON AFRICAN AMERICAN: 19 mL/min — AB (ref >60)
Glucose, Bld: 233 mg/dL — ABNORMAL HIGH (ref 65–99)
Potassium: 3.5 mmol/L (ref 3.5–5.1)
SODIUM: 156 mmol/L — AB (ref 135–145)

## 2017-05-03 LAB — PHOSPHORUS
PHOSPHORUS: 4.2 mg/dL (ref 2.5–4.6)
Phosphorus: 4.5 mg/dL (ref 2.5–4.6)

## 2017-05-03 LAB — MAGNESIUM
MAGNESIUM: 2.5 mg/dL — AB (ref 1.7–2.4)
MAGNESIUM: 2.6 mg/dL — AB (ref 1.7–2.4)

## 2017-05-03 LAB — AMMONIA: Ammonia: 48 umol/L — ABNORMAL HIGH (ref 9–35)

## 2017-05-03 MED ORDER — FREE WATER
60.0000 mL | Status: DC
  Administered 2017-05-03 – 2017-05-04 (×6): 60 mL

## 2017-05-03 MED ORDER — VITAL HIGH PROTEIN PO LIQD
1000.0000 mL | ORAL | Status: DC
  Administered 2017-05-03: 1000 mL

## 2017-05-03 MED ORDER — SODIUM CHLORIDE 0.9% FLUSH: 10.0000 mL | INTRAVENOUS | Status: DC | PRN

## 2017-05-03 MED ORDER — PANTOPRAZOLE SODIUM 40 MG PO PACK
40.0000 mg | PACK | Freq: Every day | ORAL | Status: DC
  Administered 2017-05-03 – 2017-05-06 (×4): 40 mg
  Filled 2017-05-03 (×4): qty 20

## 2017-05-03 MED ORDER — ENOXAPARIN SODIUM 30 MG/0.3ML ~~LOC~~ SOLN: 30.0000 mg | SUBCUTANEOUS | Status: DC

## 2017-05-03 MED ORDER — DEXTROSE 5 % IV SOLN
INTRAVENOUS | Status: DC
  Administered 2017-05-03: 10:00:00 via INTRAVENOUS

## 2017-05-03 MED ORDER — PRO-STAT SUGAR FREE PO LIQD
30.0000 mL | Freq: Two times a day (BID) | ORAL | Status: DC
  Administered 2017-05-03 – 2017-05-07 (×9): 30 mL

## 2017-05-03 MED ORDER — INSULIN ASPART 100 UNIT/ML ~~LOC~~ SOLN
0.0000 [IU] | SUBCUTANEOUS | Status: DC
  Administered 2017-05-03: 5 [IU] via SUBCUTANEOUS
  Administered 2017-05-03: 11 [IU] via SUBCUTANEOUS
  Administered 2017-05-03: 8 [IU] via SUBCUTANEOUS
  Administered 2017-05-04: 11 [IU] via SUBCUTANEOUS
  Administered 2017-05-04: 8 [IU] via SUBCUTANEOUS
  Filled 2017-05-03 (×5): qty 1

## 2017-05-03 MED ORDER — ADULT MULTIVITAMIN LIQUID CH
15.0000 mL | Freq: Every day | ORAL | Status: DC
  Administered 2017-05-04 – 2017-05-06 (×3): 15 mL
  Filled 2017-05-03 (×4): qty 15

## 2017-05-03 MED ORDER — SODIUM CHLORIDE 0.9% FLUSH
10.0000 mL | Freq: Two times a day (BID) | INTRAVENOUS | Status: DC
  Administered 2017-05-03 – 2017-05-04 (×3): 10 mL
  Administered 2017-05-05: 20 mL
  Administered 2017-05-05 – 2017-05-06 (×3): 10 mL
  Administered 2017-05-07: 40 mL

## 2017-05-03 MED ORDER — FUROSEMIDE 10 MG/ML IJ SOLN
40.0000 mg | Freq: Two times a day (BID) | INTRAMUSCULAR | Status: DC
  Administered 2017-05-03 – 2017-05-04 (×3): 40 mg via INTRAVENOUS
  Filled 2017-05-03 (×3): qty 4

## 2017-05-03 MED ORDER — VITAL HIGH PROTEIN PO LIQD
1000.0000 mL | ORAL | Status: DC
  Administered 2017-05-03 – 2017-05-06 (×4): 1000 mL

## 2017-05-03 NOTE — Progress Notes (Signed)
   05/03/17 1144  Clinical Encounter Type  Visited With Family  Visit Type Follow-up  Spiritual Encounters  Spiritual Needs Emotional   Chaplain met with patient's son in patient room.  Conversation regarding self-care of son.  Son shared thoughts and feelings regarding patient's current health condition and 'baby steps' he has seen over the weekend.  Open to ongoing chaplain support.

## 2017-05-03 NOTE — Progress Notes (Signed)
[place] [facility] [place] NAME: Samantha Mcmillan    MR#:  [id number]  DATE OF BIRTH:  Apr 07, 1955  SUBJECTIVE:  CHIEF COMPLAINT:   Chief Complaint  Patient presents with  . Hip Pain  . Fall  Patient became short of breath and was intubated 04/30/2017.  Patient's wife at bed side  Pulmonology is going to do weaning trials today  REVIEW OF SYSTEMS:    ROS not obtainable as the patient is intubated  DRUG ALLERGIES:   Allergies  Allergen Reactions  . Iodine Shortness Of Breath  . Latex Anaphylaxis  . Other Rash, Other (See Comments), Shortness Of Breath, Anaphylaxis and Swelling    Pt states that she is allergic to Darvocet.   . Oxycodone Shortness Of Breath, Rash and Swelling    Other reaction(s): Other (See Comments) wheezing  . Oxycodone-Acetaminophen Other (See Comments), Rash, Swelling and Shortness Of Breath    wheezing wheezing  . Shellfish-Derived Products Anaphylaxis, Rash, Shortness Of Breath and Swelling    Throat closes.  . Amitriptyline Other (See Comments)    Other reaction(s): Other (See Comments) Mental changes Other reaction(s): Other (See Comments) Altered mental status, agitation Reaction:  Mental changes   . Fish-Derived Products Other (See Comments) and Swelling    Other reaction(s): Other (See Comments) "respiratory distress and wheezing" "respiratory distress and wheezing"  . Tape Other (See Comments)    Other reaction(s): Other (See Comments) Pulls skin off Pt states that it pulls her skin off.  Pt states that it pulls her skin off.   . Wellbutrin [Bupropion] Other (See Comments)    Other reaction(s): Other (See Comments) Mental changes Reaction:  Mental changes   . Augmentin [Amoxicillin-Pot Clavulanate] Rash and Other (See Comments)    Has patient had a PCN reaction causing immediate rash, facial/tongue/throat swelling, SOB or lightheadedness with hypotension: No Has patient had a PCN reaction causing  severe rash involving mucus membranes or skin necrosis: No Has patient had a PCN reaction that required hospitalization No Has patient had a PCN reaction occurring within the last 10 years: No If all of the above answers are "NO", then may proceed with Cephalosporin use.  . Codeine Rash  . Cortisone Rash  . Diphenhydramine Rash  . Hydrocodone-Acetaminophen Rash    dizzy  . Vicodin [Hydrocodone-Acetaminophen] Rash and Other (See Comments)    Reaction:  Dizziness      VITALS:  Blood pressure 126/74, pulse (!) 121, temperature 99.6 F (37.6 C), temperature source Axillary, resp. rate (!) 22, height 5\' 4"  (1.626 m), weight 73.7 kg (162 lb 7.7 oz), SpO2 100 %.  PHYSICAL EXAMINATION:  GENERAL:  62 y.o.-year-old patient lying in the bed with no acute distress.  EYES: Pupils equal, round, reactive to light and accommodation. No scleral icterus.  HEENT: Head atraumatic, normocephalic. OT intact NECK:  Supple, no jugular venous distention. No thyroid enlargement, no tenderness.  LUNGS: Mod breath sounds bilaterally, no wheezing, rales,rhonchi or crepitation. No use of accessory muscles of respiration.  CARDIOVASCULAR: S1, S2 normal. No murmurs, rubs, or gallops.  ABDOMEN: Soft, nontender, nondistended. Bowel sounds present.  EXTREMITIES: No pedal edema, cyanosis, or clubbing.  NEUROLOGIC: Intubated PSYCHIATRIC: The patient is sedated  sKIN: No obvious rash, lesion, or ulcer.   Physical Exam LABORATORY PANEL:   CBC Recent Labs  Lab 05/02/17 0731  WBC 9.4  HGB 7.5*  HCT 24.8*  PLT 66*   ------------------------------------------------------------------------------------------------------------------  Chemistries  Recent Labs  Lab 04/27/17 0414  05/03/17 0558  NA 140   < > 156*  K 3.8   < > 3.5  CL 106   < > 127*  CO2 21*   < > 17*  GLUCOSE 197*   < > 233*  BUN 73*   < > 100*  CREATININE 1.72*   < > 2.54*  CALCIUM 8.1*   < > 8.3*  MG  --    < > 2.5*  AST 71*  --   --    ALT 51  --   --   ALKPHOS 101  --   --   BILITOT 1.8*  --   --    < > = values in this interval not displayed.   ------------------------------------------------------------------------------------------------------------------  Cardiac Enzymes No results for input(s): TROPONINI in the last 168 hours. ------------------------------------------------------------------------------------------------------------------  RADIOLOGY:  Dg Chest Port 1 View  Result Date: 05/03/2017 CLINICAL DATA:  Respiratory failure EXAM: PORTABLE CHEST 1 VIEW COMPARISON:  05/02/2017 FINDINGS: Endotracheal tube, NG tube, left PICC are stable. Interstitial edema has improved. Low lung volumes. No pneumothorax. No pleural effusion. IMPRESSION: Improving interstitial edema.  Stable support apparatus. Electronically Signed   By: Marybelle Killings M.D.   On: 05/03/2017 07:34   Dg Chest Port 1 View  Result Date: 05/02/2017 CLINICAL DATA:  Ventilator EXAM: PORTABLE CHEST 1 VIEW COMPARISON:  05/01/2017 FINDINGS: Endotracheal tube, left PICC line and NG tube are unchanged. Heart is upper limits normal in size. Continued interstitial prominence in the lungs, right greater than left, unchanged. No effusions or acute bony abnormality. IMPRESSION: Stable interstitial prominence within the lungs, right greater than left. Electronically Signed   By: Rolm Baptise M.D.   On: 05/02/2017 09:59    ASSESSMENT AND PLAN:  62 yo female w/ hx of DM, HTN, Hyperlipidemia, hx of Cirrhosis, Chronic Bronchitis, Anxiety, Anemia, GERD, Depression, PTSD, Chronic Diastolic CHF, hx of Panic Attack who presented to the hospital due to a fall and noted to have a Left Hip Fracture.   1. Acute hypoxic respiratory failure-secondary to grown bronchospasm Intubated 3/8 Vent management per protocol, weaning trials as tolerated today by pulmonology Secondary to bilateralHCAP,COPD,and CHFexacerbation  CT chest negative for PE, noted extensive bilateral  groundglass opacities with broad differential, elevated pro-calcitonin level  Continue BTs, IV Solu-Medrol, mucolytic agents,RTfollowing,  pulmonologyfollowing, echo for EF 30-35%, cardiology input appreciated, continueLasix, ST following  2.Acute on chronic diastolic CHF with hypokalemia Replete electrolites and repeat BMP in a.m. Acute hypoxia noted this morning April 24, 2017 requiringtransfer to ICU for closer monitoring, transferred back to regular nursing floor bed on April 29, 2010,  was transferred back to the ICU on April 29, 2017 for acute respiratory failure/hypoxia,chest x-ray noted for edema versus pneumonia/repeat was unchanged, CT chest noted ContinueCHFprotocol, Lasix,Aldactone, metoprolol,echo noted above, cardiology input appreciated  Echo from 2016with normal ejection fraction  3.AcutebilateralHCAP Resolving Chest x-ray, CT chest noted Continue pneumonia protocol, empiricCefepime for 5-7 day course  4.Acute on COPD exacerbation Stable  ContinueIV Solu-Medrol with tapering as tolerated, inhaled corticosteroids, supplemental oxygenprn,mucolytic agents  5.S/p Fall and Left Hip Fracture s/pleft hip hemiarthroplastyby orthopedic surgery  6. Hxof gout Stable  Continue allopurinol  7.Hypertension,chronic Stable on current regiment  8.Chronic diabetes mellitus type 2 Stable on current regiment  9.Chronic tobacco smoking abuse/dependency Cessation counseling and nicotine patch daily  10.Noncompliant with medical management  Long-term prognosis is poor-palliative care input appreciated Discussed with pulmonology  All the records are reviewed and case discussed with Care Management/Social Workerr. Management plans discussed  with the patient's x-husband at bed side  CODE STATUS: full  TOTAL TIME TAKING CARE OF THIS PATIENT: 35 minutes.     POSSIBLE D/C IN 2-5 DAYS, DEPENDING ON CLINICAL CONDITION.   Nicholes Mango M.D  on 05/03/2017   Between 7am to 6pm - Pager - [phone]  After 6pm go to [email or web address] - password [email or web address] [facility]  Office  [phone]  CC: Primary care physician; Casilda Carls, MD  Note: This dictation was prepared with Dragon dictation along with smaller phrase technology. Any transcriptional errors that result from this process are unintentional.

## 2017-05-03 NOTE — Progress Notes (Signed)
Nutrition Follow-up  DOCUMENTATION CODES:   Non-severe (moderate) malnutrition in context of chronic illness  INTERVENTION:  Initiate new goal regimen of Vital High Protein at 40 mL/hr (960 mL goal daily volume) + Pro-Stat 30 mL BID via OGT. Provides 1160 kcal, 114 grams of protein, 806 mL H2O daily. With current rate of D5 provides 1364 kcal.  Continue liquid MVI daily per tube.  Continue vitamin C 500 mg BID.  With free water flush of 60 mL Q4hrs patient is receiving a total of 1166 mL H2O daily.  NUTRITION DIAGNOSIS:   Moderate Malnutrition related to chronic illness(cirrhosis, COPD) as evidenced by mild fat depletion, mild muscle depletion, moderate muscle depletion.  Ongoing - addressing with TF regimen.  GOAL:   Patient will meet greater than or equal to 90% of their needs  Met with TF regimen.  MONITOR:   Diet advancement, Supplement acceptance, Labs, Weight trends, Skin, I & O's  REASON FOR ASSESSMENT:   Consult, Ventilator Assessment of nutrition requirement/status  ASSESSMENT:   62 year old female with PMHx of GERD, cirrhosis, HTN, depression, COPD, IBS, chronic bronchitis, DM type 2, iron deficiency anemia, anxiety, CHF, OCD, panic attacks, PTSD who presented after fall found to have left hip fracture now s/p left hemiarthroplasty 2/27, also having acute hypoxic respiratory failure from acute on chronic CHF exacerbation, acute exacerbation of COPD, pulmonary edema, and PNA.   -On 3/7 patient was transferred to stepdown unit with increased work of breathing and lethargy. -She was made NPO on 3/8 and calorie count was cancelled. -She was emergently intubated on 3/8 in setting of respiratory failure. -Rectal tube was removed on 3/9 and then replaced later on 3/9.  Patient remains intubated and sedated. Failed SBT yesterday and today.  Access: 18 Fr. OGT placed 3/8; was advanced 10 cm and terminates in stomach per chest x-ray 3/10; 55 cm at corner of mouth  TF:  pt tolerating Vital High Protein at goal rate of 55 mL/hr  Patient is currently intubated on ventilator support MV: 10.6 L/min Temp (24hrs), Avg:99.1 F (37.3 C), Min:98.2 F (36.8 C), Max:99.6 F (37.6 C)  Propofol: N/A  Medications reviewed and include: free water flush 60 mL Q4hrs, Lasix 40 mg BID IV, Novolog 0-15 units Q4hrs, Lantus 25 units QHS, Solu-Medrol 40 mg Q12hrs IV, MVI daily per tube, pantoprazole, vitamin C 500 mg BID, D5 @ 50 mL/hr (60 grams dextrose, 204 kcal daily), phenylephrine gtt at 20 mcg/min, vasopressin gtt at 0.04 units/min.  Labs reviewed: CBG 185-276, Sodium 156, Chloride 127, CO2 17, BUN 100, Creatinine 2.54, Magnesium 2.5.  I/O: 685 mL UOP yesterday (0.4 mL/kg/hr)  Discussed with RN and on rounds. Started on D5 today and free water flush of 60 mL Q4hrs. Palliative medicine met with family today and they would like to continue aggressive care. They are hopeful she will improve.  Diet Order:  Aspiration precautions Diet NPO time specified  EDUCATION NEEDS:   Not appropriate for education at this time  Skin:  Skin Assessment: Skin Integrity Issues: Skin Integrity Issues:: Incisions, Other (Comment) Incisions: closed incision left hip Other: MSAD to abdomen and groin; ecchymosis to bilateral arms and legs  Last BM:  05/02/2017 - large type 7 in rectal tube  Height:   Ht Readings from Last 1 Encounters:  04/25/17 _0  (1.626 m)    Weight:   Wt Readings from Last 1 Encounters:  05/03/17 162 lb 7.7 oz (73.7 kg)    Ideal Body Weight:  54.5 kg  BMI:  Body mass index is 27.89 kg/m.  Estimated Nutritional Needs:   Kcal:  1318 (PSU 2003b w/ MSJ 1257, Ve 7.9, Tmax 36.4)  Protein:  92-115 grams (1.3-1.6 grams/kg)  Fluid:  1.6 L/day (30 mL/kg IBW)  Willey Blade, MS, RD, LDN Office: [phone] Pager: [phone] After Hours/Weekend Pager: [phone]

## 2017-05-03 NOTE — Progress Notes (Signed)
[facility] Critical Care Medicine Progess Note    SYNOPSIS   Samantha Mcmillan is a 62 year old female with past medical history remarkable for COPD, status post hip fracture with left hip hemiarthroplasty with acute on chronic respiratory failure with superimposed pulmonary edema was transferred to the intensive care unit.   ASSESSMENT/PLAN   Respiratory failure.  Patient's bronchospasm is improved.  Continue albuterol, Atrovent, Solu-Medrol, cefepime.  Will attempt spontaneous awakening and breathing trial today  Renal insufficiency. 100 / 2.54  Leukocytosis. 19.4 on broad-spectrum coverage  Anemia. Pending CBC from this morning  Status post repair of hip fracture  Critical care time 35 minutes   VENTILATOR SETTINGS: Vent Mode: PRVC FiO2 (%):  [28 %] 28 % Set Rate:  [20 bmp] 20 bmp Vt Set:  [500 mL] 500 mL PEEP:  [5 cmH20] 5 cmH20 Pressure Support:  [5 cmH20] 5 cmH20  INTAKE / OUTPUT:  Intake/Output Summary (Last 24 hours) at 05/03/2017 0847 Last data filed at 05/03/2017 0810 Gross per 24 hour  Intake 2467.3 ml  Output 2260 ml  Net 207.3 ml    Name: Samantha Mcmillan MRN: [id number] DOB: 1955/03/26    ADMISSION DATE:  04/07/2017  SUBJECTIVE:   Pt currently on the ventilator, can not provide history or review of systems.   VITAL SIGNS: Temp:  [98.2 F (36.8 C)-99.6 F (37.6 C)] 99.4 F (37.4 C) (03/11 0800) Pulse Rate:  [72-120] 120 (03/11 0830) Resp:  [11-30] 20 (03/11 0830) BP: (83-148)/(46-82) 141/82 (03/11 0830) SpO2:  [100 %] 100 % (03/11 0830) FiO2 (%):  [28 %] 28 % (03/11 0807) Weight:  [73.7 kg (162 lb 7.7 oz)] 73.7 kg (162 lb 7.7 oz) (03/11 0530)  PHYSICAL EXAMINATION: Physical Examination:   VS: BP (!) 141/82   Pulse (!) 120   Temp 99.4 F (37.4 C) (Axillary)   Resp 20   Ht 5\' 4"  (1.626 m)   Wt 73.7 kg (162 lb 7.7 oz)   LMP  (LMP Unknown)   SpO2 100%   BMI 27.89 kg/m   General Appearance: patient is sedated on mechanical  ventilation Neuro:Limited exam HEENT: patient is orally intubated, oral gastric tube in place, trachea is midline Pulmonary: diffuse bronchospasm appreciated on exam CardiovascularNormal S1,S2.   Abdomen: positive bowel sounds, soft exam. Skin:   warm, no rashes, no ecchymosis  Extremities: normal, no cyanosis, clubbing.    LABORATORY PANEL:   CBC Recent Labs  Lab 05/02/17 0731  WBC 9.4  HGB 7.5*  HCT 24.8*  PLT 66*    Chemistries  Recent Labs  Lab 04/27/17 0414  05/03/17 0558  NA 140   < > 156*  K 3.8   < > 3.5  CL 106   < > 127*  CO2 21*   < > 17*  GLUCOSE 197*   < > 233*  BUN 73*   < > 100*  CREATININE 1.72*   < > 2.54*  CALCIUM 8.1*   < > 8.3*  MG  --    < > 2.5*  PHOS  --    < > 4.2  AST 71*  --   --   ALT 51  --   --   ALKPHOS 101  --   --   BILITOT 1.8*  --   --    < > = values in this interval not displayed.    Recent Labs  Lab 05/02/17 1134 05/02/17 1542 05/02/17 2022 05/02/17 2311 05/03/17 0413 05/03/17 0755  GLUCAP 171* 179*  196* 185* 219* 209*   Recent Labs  Lab 04/30/17 1446 05/02/17 1340  PHART 7.28* 7.28*  PCO2ART 52* 42  PO2ART 127* 55*   Recent Labs  Lab 04/27/17 0414  AST 71*  ALT 51  ALKPHOS 101  BILITOT 1.8*  ALBUMIN 1.9*    Cardiac Enzymes No results for input(s): TROPONINI in the last 168 hours.  RADIOLOGY:  Dg Chest Port 1 View  Result Date: 05/03/2017 CLINICAL DATA:  Respiratory failure EXAM: PORTABLE CHEST 1 VIEW COMPARISON:  05/02/2017 FINDINGS: Endotracheal tube, NG tube, left PICC are stable. Interstitial edema has improved. Low lung volumes. No pneumothorax. No pleural effusion. IMPRESSION: Improving interstitial edema.  Stable support apparatus. Electronically Signed   By: Marybelle Killings M.D.   On: 05/03/2017 07:34   Dg Chest Port 1 View  Result Date: 05/02/2017 CLINICAL DATA:  Ventilator EXAM: PORTABLE CHEST 1 VIEW COMPARISON:  05/01/2017 FINDINGS: Endotracheal tube, left PICC line and NG tube are  unchanged. Heart is upper limits normal in size. Continued interstitial prominence in the lungs, right greater than left, unchanged. No effusions or acute bony abnormality. IMPRESSION: Stable interstitial prominence within the lungs, right greater than left. Electronically Signed   By: Rolm Baptise M.D.   On: 05/02/2017 09:59   Samantha Dellen, DO  05/03/2017

## 2017-05-03 NOTE — Progress Notes (Signed)
Daily Progress Note   Patient Name: Samantha Mcmillan       Date: 05/03/2017 DOB: Apr 02, 1955  Age: 62 y.o. MRN#: [id number] Attending Physician: Nicholes Mango, MD Primary Care Physician: Casilda Carls, MD Admit Date: 04/16/2017  Reason for Consultation/Follow-up: Establishing goals of care  Subjective:  Patient initially on BIPAP and transition to cannula and moved to regular bed. Patient was intubated on the 8th following bed level exercises. Son [name] at bedside, he states he has not been home in 12 days. He states she has been sick before, but perhaps not this critical.    Discussed QOL, GOC, and her wishes. He states his mother indicated in the past she would not want to be placed in a facility, and he knows she will be angry, but he may need to place her. He believes from what the doctors are saying that his mother's status is improving. He understands team is working to correct her labs and increase her O2 levels.  Introduced question of reintubation vs one way extubation with comfort care upon extubation.  He states as long as the doctors and nurses say she is improving, he would like to continue care. He states as long as she is willing to fight, he will. He states if it comes to a point she is no longer showing improvement, he will discuss "the second part of our conversation."  David's father is patient's caregiver and ex-husband. [name] is POA. David's father to bedside at this time with [name]. When broaching the subject of what her wishes would be or what an acceptable quality of life would be for her, he states as long as she can blink or speak, he will care for her. He states they have been told she could be re intubated if needed, and would want her re intubated if it were necessary  following this extubation. He stated that he understands palliative care services can discuss all options, but is not needed right now as a plan is in place to continue current care.       Length of Stay: 13  Current Medications: Scheduled Meds:  . budesonide (PULMICORT) nebulizer solution  0.25 mg Nebulization Q12H  . chlorhexidine gluconate (MEDLINE KIT)  15 mL Mouth Rinse BID  . enoxaparin (LOVENOX) injection  30 mg Subcutaneous Q24H  .  free water  60 mL Per Tube Q4H  . furosemide  40 mg Intravenous BID  . insulin aspart  0-9 Units Subcutaneous Q4H  . insulin glargine  25 Units Subcutaneous QHS  . lactulose  30 g Per Tube TID  . levalbuterol  1.25 mg Nebulization Q6H  . mouth rinse  15 mL Mouth Rinse 10 times per day  . methylPREDNISolone (SOLU-MEDROL) injection  40 mg Intravenous Q12H  . multivitamin with minerals  1 tablet Per Tube Daily  . nicotine  14 mg Transdermal Daily  . pantoprazole sodium  40 mg Per Tube Daily  . spironolactone  25 mg Oral Daily  . vitamin C  500 mg Oral BID    Continuous Infusions: . dextrose    . feeding supplement (VITAL HIGH PROTEIN) Stopped (05/03/17 0810)  . fentaNYL infusion INTRAVENOUS Stopped (05/02/17 1015)  . phenylephrine (NEO-SYNEPHRINE) Adult infusion 20 mcg/min (05/03/17 0905)  . vasopressin (PITRESSIN) infusion - *FOR SHOCK* 0.04 Units/min (05/03/17 0800)    PRN Meds: acetaminophen **OR** acetaminophen, alum & mag hydroxide-simeth, bisacodyl, fentaNYL (SUBLIMAZE) injection, magnesium citrate, metoprolol tartrate, midazolam, morphine injection, ondansetron **OR** ondansetron (ZOFRAN) IV, polyethylene glycol, senna-docusate  Physical Exam  Constitutional: No distress.  Pulmonary/Chest:  Intubated  Skin: Skin is warm and dry.            Vital Signs: BP (!) 141/82   Pulse (!) 120   Temp 99.4 F (37.4 C) (Axillary)   Resp 20   Ht 5' 4" (1.626 m)   Wt 73.7 kg (162 lb 7.7 oz)   LMP  (LMP Unknown)   SpO2 100%   BMI 27.89  kg/m  SpO2: SpO2: 100 % O2 Device: O2 Device: Ventilator O2 Flow Rate: O2 Flow Rate (L/min): 14 L/min  Intake/output summary:   Intake/Output Summary (Last 24 hours) at 05/03/2017 1031 Last data filed at 05/03/2017 0810 Gross per 24 hour  Intake 2204.14 ml  Output 2160 ml  Net 44.14 ml   LBM: Last BM Date: 05/03/17 Baseline Weight: Weight: 70.3 kg (155 lb) Most recent weight: Weight: 73.7 kg (162 lb 7.7 oz)       Palliative Assessment/Data:    Flowsheet Rows     Most Recent Value  Intake Tab  Referral Department  Hospitalist  Unit at Time of Referral  Med/Surg Unit  Palliative Care Primary Diagnosis  Pulmonary  Date Notified  04/25/17  Palliative Care Type  New Palliative care  Reason for referral  Clarify Goals of Care  Date of Admission  04/17/2017  Date first seen by Palliative Care  04/26/17  # of days Palliative referral response time  1 Day(s)  # of days IP prior to Palliative referral  5  Clinical Assessment  Psychosocial & Spiritual Assessment  Palliative Care Outcomes      Patient Active Problem List   Diagnosis Date Noted  . Malnutrition of moderate degree 04/29/2017  . Acute respiratory failure with hypoxia ([place])   . Acute pulmonary edema (HCC)   . Hip fracture ([place]) 04/11/2017  . History of colonic polyps   . Benign neoplasm of transverse colon   . Benign neoplasm of descending colon   . Gastritis without bleeding   . Venous stasis 08/10/2016  . Hypokalemia 12/27/2015  . Acute on chronic diastolic heart failure ([place]) 12/27/2015  . Abdominal pain 11/13/2015  . Chronic pain 11/13/2015  . Closed nondisplaced fracture of seventh cervical vertebra with routine healing 11/13/2015  . Essential hypertension 11/13/2015  . Gallstone pancreatitis 11/13/2015  .  Jaundice 11/13/2015  . Closed nondisplaced fracture of seventh cervical vertebra (HCC) 11/01/2015  . ARF (acute renal failure) (HCC) 07/06/2015  . Hyperkalemia 07/06/2015  . Dehydration 07/06/2015    . Personal history of tobacco use, presenting hazards to health 06/21/2015  . Bilateral carotid artery stenosis 06/04/2015  . Mixed hyperlipidemia 06/04/2015  . Fall 04/16/2015  . Bacterial skin infection of leg 10/24/2014  . Edema leg 10/08/2014  . Hypertensive pulmonary vascular disease (HCC) 10/08/2014  . Mixed obsessional thoughts and acts 09/27/2014  . Chronic diastolic heart failure (HCC) 09/21/2014  . Tobacco use 09/21/2014  . Thrombocytopenia (HCC) 09/19/2014  . Leukopenia 09/19/2014  . Stasis dermatitis of left lower extremity with venous ulcer due to chronic peripheral venous hypertension (HCC) 08/15/2014  . IDA (iron deficiency anemia) 08/14/2014  . Ascites 07/25/2014  . Cellulitis and abscess of lower extremity 07/07/2014  . Encephalopathy, hepatic (HCC) 05/18/2014  . Polypharmacy 05/18/2014  . Failure to thrive in adult 05/16/2014  . Hepatic cirrhosis (HCC) 05/16/2014  . Protein-calorie malnutrition, severe (HCC) 05/16/2014  . COPD with acute exacerbation (HCC) 05/16/2014  . Acute on chronic diastolic CHF (congestive heart failure) (HCC) 05/16/2014  . Chronic back pain 05/16/2014  . Weakness 05/15/2014  . Hyponatremia 05/15/2014  . HCAP (healthcare-associated pneumonia) 05/09/2014  . Urinary retention 05/09/2014  . DM2 (diabetes mellitus, type 2) (HCC) 05/09/2014  . Hypothyroidism 05/05/2014  . Cervical stenosis of spine 03/11/2013  . Major depressive disorder, recurrent episode, severe (HCC) 12/05/2012  . Posttraumatic stress disorder 12/05/2012    Palliative Care Assessment & Plan   Patient Profile: SamanthaMcmillanis a61 y.o.femalewith a known history of chronic diastolic heart failure, cirrhosis of liver, bronchial asthma, COPD, GERD, hyperlipidemia, hypertension, irritable bowel syndrome, iron deficiency anemia, type 2 diabetes mellitus presented to the emergency room for fall.    Assessment/Recommendations/Plan:  Intubated  Continue aggressive  care as indicated.    Code Status:    Code Status Orders  (From admission, onward)        Start     Ordered   04/01/2017 0009  Full code  Continuous     04/11/2017 0008    Code Status History    Date Active Date Inactive Code Status Order ID Comments User Context   12/27/2015 23:03 12/30/2015 17:05 Full Code [id number]  Willis, David, MD Inpatient   07/06/2015 19:57 07/09/2015 17:14 Full Code [id number]  Sparks, Jeffrey D, MD Inpatient   04/16/2015 06:01 04/17/2015 17:27 Full Code [id number]  Pyreddy, Pavan, MD Inpatient   09/06/2014 16:01 09/08/2014 14:39 Full Code [id number]  Chen, Qing, MD Inpatient   08/26/2014 22:01 08/29/2014 15:33 Full Code [id number]  Hower, David K, MD ED   07/07/2014 01:38 07/09/2014 15:57 Full Code [id number]  Reddy, Edavally N, MD Inpatient   05/15/2014 22:45 05/18/2014 18:07 Full Code [id number]  Khan, Saadat A, MD Inpatient   05/04/2014 16:03 05/09/2014 16:55 Full Code [id number]  Alva, Rakesh V, MD ED    Advance Directive Documentation     Most Recent Value  Type of Advance Directive  Healthcare Power of Attorney, Living will  Pre-existing out of facility DNR order (yellow form or pink MOST form)  No data  "MOST" Form in Place?  No data       Prognosis:  Poor long term. Reintubated. COPD, PNA, cirrhosis. S/p hip fracture and repair.   Discharge Planning:  Family anticipating discharge to rehab.     Thank you for allowing the Palliative Medicine Team to assist in the care of   this patient.   Time In: 10:10 Time Out: 11:00 Total Time 50 min Prolonged Time Billed yes      Greater than 50%  of this time was spent counseling and coordinating care related to the above assessment and plan.   , NP  Please contact Palliative Medicine Team phone at [phone] for questions and concerns.      

## 2017-05-03 NOTE — Progress Notes (Signed)
[facility]  ROUNDING NOTE   Subjective:   Son at bedside. Meeting with palliative care today.   Patient critically ill. Intubated, sedated.   Phenylephrine gtt Vasopressin gtt  Na 156  UOP 685   Objective:  Vital signs in last 24 hours:  Temp:  [98.2 F (36.8 C)-99.6 F (37.6 C)] 99.4 F (37.4 C) (03/11 0800) Pulse Rate:  [73-120] 120 (03/11 0830) Resp:  [11-30] 20 (03/11 0830) BP: (83-148)/(46-82) 141/82 (03/11 0830) SpO2:  [100 %] 100 % (03/11 0830) FiO2 (%):  [28 %] 28 % (03/11 0807) Weight:  [73.7 kg (162 lb 7.7 oz)] 73.7 kg (162 lb 7.7 oz) (03/11 0530)  Weight change:  Filed Weights   04/30/17 0435 05/01/17 0415 05/03/17 0530  Weight: 70.9 kg (156 lb 4.9 oz) 73.3 kg (161 lb 9.6 oz) 73.7 kg (162 lb 7.7 oz)    Intake/Output: I/O last 3 completed shifts: In: 3416.3 [I.V.:3181.3; NG/GT:35; IV Piggyback:200] Out: 0175 [Urine:1215; Stool:2275]   Intake/Output this shift:  Total I/O In: 533.2 [I.V.:154; NG/GT:379.2] Out: -   Physical Exam: General: Critically ill  Head: ETT OGT  Eyes: Anicteric, PERRL  Neck: Supple   Lungs:  PRVC FiO2 28%  Heart: Tachycardia, sinus  Abdomen:  Soft, nontender  Extremities: no peripheral edema.  Neurologic: Intubated, sedated  Skin: No lesions  Access: none    Basic Metabolic Panel: Recent Labs  Lab 04/28/17 0753 04/30/17 1703 05/01/17 0458 05/02/17 0731 05/02/17 1700 05/03/17 0558  NA 143 146* 149* 154*  --  156*  K 3.6 4.2 3.8 3.4*  --  3.5  CL 110 115* 119* 125*  --  127*  CO2 22 23 20* 20*  --  17*  GLUCOSE 262* 124* 180* 193*  --  233*  BUN 80* 91* 94* 95*  --  100*  CREATININE 1.63* 2.08* 2.22* 2.27*  --  2.54*  CALCIUM 8.4* 8.3* 8.0* 8.3*  --  8.3*  MG  --  2.6* 2.4  --  2.5* 2.5*  PHOS  --  5.0* 4.6  --  4.5 4.2    Liver Function Tests: Recent Labs  Lab 04/27/17 0414  AST 71*  ALT 51  ALKPHOS 101  BILITOT 1.8*  PROT 5.2*  ALBUMIN 1.9*   No results for input(s): LIPASE,  AMYLASE in the last 168 hours. Recent Labs  Lab 04/30/17 1043 05/02/17 0731 05/02/17 0842  AMMONIA 52* 48* 31    CBC: Recent Labs  Lab 04/27/17 0414 04/28/17 0753 05/01/17 0458 05/02/17 0731  WBC 9.4 11.2* 19.4* 9.4  NEUTROABS 8.7* 10.7*  --   --   HGB 8.1* 8.1* 7.7* 7.5*  HCT 24.2* 26.2* 25.3* 24.8*  MCV 95.2 97.9 100.6* 100.5*  PLT 89* 106* 117* 66*    Cardiac Enzymes: No results for input(s): CKTOTAL, CKMB, CKMBINDEX, TROPONINI in the last 168 hours.  BNP: Invalid input(s): POCBNP  CBG: Recent Labs  Lab 05/02/17 1542 05/02/17 2022 05/02/17 2311 05/03/17 0413 05/03/17 0755  GLUCAP 179* 196* 185* 32* 209*    Microbiology: Results for orders placed or performed during the hospital encounter of 03/29/2017  Surgical PCR screen     Status: None   Collection Time: 04/22/2017 12:09 AM  Result Value Ref Range Status   MRSA, PCR NEGATIVE NEGATIVE Final   Staphylococcus aureus NEGATIVE NEGATIVE Final    Comment: (NOTE) The Xpert SA Assay (FDA approved for NASAL specimens in patients 46 years of age and older), is one component of a comprehensive  surveillance program. It is not intended to diagnose infection nor to guide or monitor treatment. Performed at [facility], [address] [place], [place] [address]   Culture, blood (routine x 2) Call MD if unable to obtain prior to antibiotics being given     Status: None   Collection Time: 04/24/17 10:40 AM  Result Value Ref Range Status   Specimen Description BLOOD BLOOD RIGHT ARM  Final   Special Requests   Final    BOTTLES DRAWN AEROBIC AND ANAEROBIC Blood Culture adequate volume   Culture   Final    NO GROWTH 5 DAYS Performed at [facility], [address] [place], [place] [address]    Report Status 04/29/2017 FINAL  Final  Culture, blood (routine x 2) Call MD if unable to obtain prior to antibiotics being given     Status: None   Collection Time: 04/24/17 10:55 AM  Result Value Ref  Range Status   Specimen Description BLOOD BLOOD LEFT HAND  Final   Special Requests   Final    BOTTLES DRAWN AEROBIC AND ANAEROBIC Blood Culture adequate volume   Culture   Final    NO GROWTH 5 DAYS Performed at [facility], [address] [place], [place] [address]    Report Status 04/29/2017 FINAL  Final    Coagulation Studies: No results for input(s): LABPROT, INR in the last 72 hours.  Urinalysis: No results for input(s): COLORURINE, LABSPEC, PHURINE, GLUCOSEU, HGBUR, BILIRUBINUR, KETONESUR, PROTEINUR, UROBILINOGEN, NITRITE, LEUKOCYTESUR in the last 72 hours.  Invalid input(s): APPERANCEUR    Imaging: Dg Chest Port 1 View  Result Date: 05/03/2017 CLINICAL DATA:  Respiratory failure EXAM: PORTABLE CHEST 1 VIEW COMPARISON:  05/02/2017 FINDINGS: Endotracheal tube, NG tube, left PICC are stable. Interstitial edema has improved. Low lung volumes. No pneumothorax. No pleural effusion. IMPRESSION: Improving interstitial edema.  Stable support apparatus. Electronically Signed   By: Marybelle Killings M.D.   On: 05/03/2017 07:34   Dg Chest Port 1 View  Result Date: 05/02/2017 CLINICAL DATA:  Ventilator EXAM: PORTABLE CHEST 1 VIEW COMPARISON:  05/01/2017 FINDINGS: Endotracheal tube, left PICC line and NG tube are unchanged. Heart is upper limits normal in size. Continued interstitial prominence in the lungs, right greater than left, unchanged. No effusions or acute bony abnormality. IMPRESSION: Stable interstitial prominence within the lungs, right greater than left. Electronically Signed   By: Rolm Baptise M.D.   On: 05/02/2017 09:59     Medications:   . dextrose    . feeding supplement (VITAL HIGH PROTEIN) Stopped (05/03/17 0810)  . fentaNYL infusion INTRAVENOUS Stopped (05/02/17 1015)  . phenylephrine (NEO-SYNEPHRINE) Adult infusion 20 mcg/min (05/03/17 0905)  . vasopressin (PITRESSIN) infusion - *FOR SHOCK* 0.04 Units/min (05/03/17 0800)   . budesonide (PULMICORT) nebulizer  solution  0.25 mg Nebulization Q12H  . chlorhexidine gluconate (MEDLINE KIT)  15 mL Mouth Rinse BID  . enoxaparin (LOVENOX) injection  30 mg Subcutaneous Q24H  . furosemide  20 mg Intravenous Daily  . insulin aspart  0-9 Units Subcutaneous Q4H  . insulin glargine  25 Units Subcutaneous QHS  . lactulose  30 g Per Tube TID  . levalbuterol  1.25 mg Nebulization Q6H  . mouth rinse  15 mL Mouth Rinse 10 times per day  . methylPREDNISolone (SOLU-MEDROL) injection  40 mg Intravenous Q12H  . multivitamin with minerals  1 tablet Per Tube Daily  . nicotine  14 mg Transdermal Daily  . pantoprazole sodium  40 mg Per  Tube Daily  . spironolactone  25 mg Oral Daily  . vitamin C  500 mg Oral BID   acetaminophen **OR** acetaminophen, alum & mag hydroxide-simeth, bisacodyl, fentaNYL (SUBLIMAZE) injection, magnesium citrate, metoprolol tartrate, midazolam, morphine injection, ondansetron **OR** ondansetron (ZOFRAN) IV, polyethylene glycol, senna-docusate  Assessment/ Plan:  [name] is a 62 y.o. white female with anemia, hypothyroidism, hyponatremia, COPD, gout, hypertension, chronic edema, hyperlipidemia, diabetes mellitus type 2, GERD admitted to [facility] on 04/08/2017 for Closed fracture of left hip, initial encounter ([place]) [S72.002A] Fall, initial encounter [W19.XXXA]   Prolonged hospitalization with acute respiratory failure on 3/8 and intubated and sedated.   1. 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  Acute renal failure with ATN - Continue to renally dose medications.  - Hold lisinopril.  - Increase furosemide to '40mg'$  IV  - Continue spironolactone - Monitor volume status. Not a candidate for long term dialysis  2. Hypernatremia: free water deficit 4.2  liters - Discontinue LR - Start D5W - Increase furosemide as above - Increase free water flushes to 56m q 4 with tube feeds  3. Acute exacerbation of systolic congestive heart failure and respiratory failure requiring mechanical ventilation Echo 30-35%. Reviewed with family     LOS: 1[place]3/11/201910:16 AM

## 2017-05-03 NOTE — Progress Notes (Signed)
PHARMACIST - PHYSICIAN COMMUNICATION  CONCERNING: IV to Oral Route Change Policy  RECOMMENDATION: This patient is receiving pantoprazole by the intravenous route.  Based on criteria approved by the Pharmacy and Therapeutics Committee, the intravenous medication(s) is/are being converted to the equivalent oral dose form(s).   DESCRIPTION: These criteria include:  The patient is eating (either orally or via tube) and/or has been taking other orally administered medications for a least 24 hours  The patient has no evidence of active gastrointestinal bleeding or impaired GI absorption (gastrectomy, short bowel, patient on TNA or NPO).  If you have questions about this conversion, please contact the Pharmacy Department  []   [phone] )  [facility] [x]   [phone] )  [facility] []   [phone] )  [facility] []   [phone] )  [facility] []   [phone] )  [place], [facility] 05/03/2017 9:03 AM

## 2017-05-04 LAB — RENAL FUNCTION PANEL
ANION GAP: 8 (ref 5–15)
Albumin: 1.8 g/dL — ABNORMAL LOW (ref 3.5–5.0)
BUN: 107 mg/dL — ABNORMAL HIGH (ref 6–20)
CHLORIDE: 127 mmol/L — AB (ref 101–111)
CO2: 19 mmol/L — AB (ref 22–32)
Calcium: 8.1 mg/dL — ABNORMAL LOW (ref 8.9–10.3)
Creatinine, Ser: 3.09 mg/dL — ABNORMAL HIGH (ref 0.44–1.00)
GFR calc Af Amer: 17 mL/min — ABNORMAL LOW (ref >60)
GFR calc non Af Amer: 15 mL/min — ABNORMAL LOW (ref >60)
GLUCOSE: 381 mg/dL — AB (ref 65–99)
POTASSIUM: 3.4 mmol/L — AB (ref 3.5–5.1)
Phosphorus: 4.9 mg/dL — ABNORMAL HIGH (ref 2.5–4.6)
SODIUM: 154 mmol/L — AB (ref 135–145)

## 2017-05-04 LAB — BLOOD GAS, ARTERIAL
ACID-BASE DEFICIT: 12 mmol/L — AB (ref 0.0–2.0)
Bicarbonate: 16 mmol/L — ABNORMAL LOW (ref 20.0–28.0)
FIO2: 0.28
O2 Saturation: 72.5 %
PCO2 ART: 45 mmHg (ref 32.0–48.0)
PEEP/CPAP: 5 cmH2O
PH ART: 7.16 — AB (ref 7.350–7.450)
PO2 ART: 50 mmHg — AB (ref 83.0–108.0)
Patient temperature: 37
Pressure support: 5 cmH2O

## 2017-05-04 LAB — GLUCOSE, CAPILLARY
GLUCOSE-CAPILLARY: 193 mg/dL — AB (ref 65–99)
GLUCOSE-CAPILLARY: 124 mg/dL — AB (ref 65–99)
GLUCOSE-CAPILLARY: 132 mg/dL — AB (ref 65–99)
GLUCOSE-CAPILLARY: 149 mg/dL — AB (ref 65–99)
GLUCOSE-CAPILLARY: 222 mg/dL — AB (ref 65–99)
Glucose-Capillary: 307 mg/dL — ABNORMAL HIGH (ref 65–99)
Glucose-Capillary: 353 mg/dL — ABNORMAL HIGH (ref 65–99)
Glucose-Capillary: 295 mg/dL — ABNORMAL HIGH (ref 65–99)
Glucose-Capillary: 309 mg/dL — ABNORMAL HIGH (ref 65–99)
Glucose-Capillary: 314 mg/dL — ABNORMAL HIGH (ref 65–99)
Glucose-Capillary: 219 mg/dL — ABNORMAL HIGH (ref 65–99)
Glucose-Capillary: 227 mg/dL — ABNORMAL HIGH (ref 65–99)
Glucose-Capillary: 147 mg/dL — ABNORMAL HIGH (ref 65–99)
Glucose-Capillary: 132 mg/dL — ABNORMAL HIGH (ref 65–99)
Glucose-Capillary: 133 mg/dL — ABNORMAL HIGH (ref 65–99)

## 2017-05-04 LAB — MAGNESIUM: Magnesium: 2.6 mg/dL — ABNORMAL HIGH (ref 1.7–2.4)

## 2017-05-04 LAB — PHOSPHORUS: Phosphorus: 4.9 mg/dL — ABNORMAL HIGH (ref 2.5–4.6)

## 2017-05-04 LAB — PLATELET COUNT: Platelets: 45 10*3/uL — ABNORMAL LOW (ref 150–440)

## 2017-05-04 MED ORDER — STERILE WATER FOR INJECTION IV SOLN
INTRAVENOUS | Status: DC
  Administered 2017-05-04 – 2017-05-06 (×4): via INTRAVENOUS
  Filled 2017-05-04 (×5): qty 850

## 2017-05-04 MED ORDER — INSULIN DETEMIR 100 UNIT/ML ~~LOC~~ SOLN: 5.0000 [IU] | Freq: Two times a day (BID) | SUBCUTANEOUS | Status: DC

## 2017-05-04 MED ORDER — POTASSIUM CHLORIDE 20 MEQ/15ML (10%) PO SOLN
60.0000 meq | Freq: Once | ORAL | Status: DC
  Filled 2017-05-04: qty 45

## 2017-05-04 MED ORDER — INSULIN DETEMIR 100 UNIT/ML ~~LOC~~ SOLN
5.0000 [IU] | Freq: Two times a day (BID) | SUBCUTANEOUS | Status: DC
  Administered 2017-05-04: 5 [IU] via SUBCUTANEOUS
  Filled 2017-05-04 (×2): qty 0.05

## 2017-05-04 MED ORDER — INSULIN ASPART 100 UNIT/ML ~~LOC~~ SOLN
0.0000 [IU] | SUBCUTANEOUS | Status: DC
  Administered 2017-05-04: 20 [IU] via SUBCUTANEOUS
  Filled 2017-05-04: qty 1

## 2017-05-04 MED ORDER — INSULIN ASPART 100 UNIT/ML ~~LOC~~ SOLN
1.0000 [IU] | SUBCUTANEOUS | Status: DC
  Administered 2017-05-05 (×2): 3 [IU] via SUBCUTANEOUS
  Filled 2017-05-04: qty 1

## 2017-05-04 MED ORDER — INSULIN DETEMIR 100 UNIT/ML ~~LOC~~ SOLN
5.0000 [IU] | Freq: Two times a day (BID) | SUBCUTANEOUS | Status: DC
  Filled 2017-05-04 (×2): qty 0.05

## 2017-05-04 MED ORDER — SODIUM CHLORIDE 0.9 % IV SOLN
INTRAVENOUS | Status: DC
  Administered 2017-05-04: 2.4 [IU]/h via INTRAVENOUS
  Filled 2017-05-04: qty 1

## 2017-05-04 MED ORDER — FREE WATER
200.0000 mL | Freq: Four times a day (QID) | Status: DC
  Administered 2017-05-04 – 2017-05-07 (×13): 200 mL

## 2017-05-04 MED ORDER — POTASSIUM CHLORIDE 20 MEQ PO PACK
20.0000 meq | PACK | Freq: Once | ORAL | Status: AC
  Administered 2017-05-04: 20 meq
  Filled 2017-05-04: qty 1

## 2017-05-04 NOTE — Progress Notes (Signed)
Pt tolerated 12 hrs ps wean without any incident , placed back on prior settings.

## 2017-05-04 NOTE — Progress Notes (Signed)
[facility]  ROUNDING NOTE   Subjective:   Son at bedside.   Off vasopressors.   Oliguric to anuric.   Na 154 (156)   Objective:  Vital signs in last 24 hours:  Temp:  [97.6 F (36.4 C)-98.8 F (37.1 C)] 98.8 F (37.1 C) (03/12 1200) Pulse Rate:  [94-148] 120 (03/12 1500) Resp:  [11-30] 13 (03/12 1500) BP: (95-124)/(52-96) 102/60 (03/12 1500) SpO2:  [98 %-100 %] 100 % (03/12 1500) FiO2 (%):  [28 %-35 %] 35 % (03/12 1413) Weight:  [76.3 kg (168 lb 3.4 oz)] 76.3 kg (168 lb 3.4 oz) (03/12 0406)  Weight change: 2.6 kg (5 lb 11.7 oz) Filed Weights   05/01/17 0415 05/03/17 0530 05/04/17 0406  Weight: 73.3 kg (161 lb 9.6 oz) 73.7 kg (162 lb 7.7 oz) 76.3 kg (168 lb 3.4 oz)    Intake/Output: I/O last 3 completed shifts: In: 3834.3 [I.V.:2473.9; NG/GT:1360.3] Out: 2160 [Urine:685; HLKTG:2563]   Intake/Output this shift:  Total I/O In: 887.4 [I.V.:487.4; NG/GT:400] Out: 330 [Urine:30; Stool:300]  Physical Exam: General: Critically ill  Head: ETT OGT  Eyes: Anicteric, PERRL  Neck: Supple   Lungs:  PRVC FiO2 30%  Heart: Tachycardia, sinus  Abdomen:  Soft, nontender  Extremities: no peripheral edema.  Neurologic: Intubated, sedated  Skin: No lesions  Access: none    Basic Metabolic Panel: Recent Labs  Lab 04/30/17 1703 05/01/17 0458 05/02/17 0731 05/02/17 1700 05/03/17 0558 05/03/17 1832 05/04/17 0432  NA 146* 149* 154*  --  156*  --  154*  K 4.2 3.8 3.4*  --  3.5  --  3.4*  CL 115* 119* 125*  --  127*  --  127*  CO2 23 20* 20*  --  17*  --  19*  GLUCOSE 124* 180* 193*  --  233*  --  381*  BUN 91* 94* 95*  --  100*  --  107*  CREATININE 2.08* 2.22* 2.27*  --  2.54*  --  3.09*  CALCIUM 8.3* 8.0* 8.3*  --  8.3*  --  8.1*  MG 2.6* 2.4  --  2.5* 2.5* 2.6* 2.6*  PHOS 5.0* 4.6  --  4.5 4.2 4.5 4.9*  4.9*    Liver Function Tests: Recent Labs  Lab 05/04/17 0432  ALBUMIN 1.8*   No results for input(s): LIPASE, AMYLASE in the last 168  hours. Recent Labs  Lab 04/30/17 1043 05/02/17 0731 05/02/17 0842  AMMONIA 52* 48* 31    CBC: Recent Labs  Lab 04/28/17 0753 05/01/17 0458 05/02/17 0731 05/04/17 0432  WBC 11.2* 19.4* 9.4  --   NEUTROABS 10.7*  --   --   --   HGB 8.1* 7.7* 7.5*  --   HCT 26.2* 25.3* 24.8*  --   MCV 97.9 100.6* 100.5*  --   PLT 106* 117* 66* 45*    Cardiac Enzymes: No results for input(s): CKTOTAL, CKMB, CKMBINDEX, TROPONINI in the last 168 hours.  BNP: Invalid input(s): POCBNP  CBG: Recent Labs  Lab 05/04/17 1118 05/04/17 1217 05/04/17 1317 05/04/17 1425 05/04/17 1519  GLUCAP 295* 309* 314* 219* 227*    Microbiology: Results for orders placed or performed during the hospital encounter of 04/02/2017  Surgical PCR screen     Status: None   Collection Time: 04/05/2017 12:09 AM  Result Value Ref Range Status   MRSA, PCR NEGATIVE NEGATIVE Final   Staphylococcus aureus NEGATIVE NEGATIVE Final    Comment: (NOTE) The Xpert SA Assay (FDA approved  for NASAL specimens in patients 57 years of age and older), is one component of a comprehensive surveillance program. It is not intended to diagnose infection nor to guide or monitor treatment. Performed at [facility], [address] [place], [place] [address]   Culture, blood (routine x 2) Call MD if unable to obtain prior to antibiotics being given     Status: None   Collection Time: 04/24/17 10:40 AM  Result Value Ref Range Status   Specimen Description BLOOD BLOOD RIGHT ARM  Final   Special Requests   Final    BOTTLES DRAWN AEROBIC AND ANAEROBIC Blood Culture adequate volume   Culture   Final    NO GROWTH 5 DAYS Performed at [facility], [address] [place], [place] [address]    Report Status 04/29/2017 FINAL  Final  Culture, blood (routine x 2) Call MD if unable to obtain prior to antibiotics being given     Status: None   Collection Time: 04/24/17 10:55 AM  Result Value Ref Range Status   Specimen  Description BLOOD BLOOD LEFT HAND  Final   Special Requests   Final    BOTTLES DRAWN AEROBIC AND ANAEROBIC Blood Culture adequate volume   Culture   Final    NO GROWTH 5 DAYS Performed at [facility], [address], [place], [place] [address]    Report Status 04/29/2017 FINAL  Final    Coagulation Studies: No results for input(s): LABPROT, INR in the last 72 hours.  Urinalysis: No results for input(s): COLORURINE, LABSPEC, PHURINE, GLUCOSEU, HGBUR, BILIRUBINUR, KETONESUR, PROTEINUR, UROBILINOGEN, NITRITE, LEUKOCYTESUR in the last 72 hours.  Invalid input(s): APPERANCEUR    Imaging: Dg Chest Port 1 View  Result Date: 05/03/2017 CLINICAL DATA:  Respiratory failure EXAM: PORTABLE CHEST 1 VIEW COMPARISON:  05/02/2017 FINDINGS: Endotracheal tube, NG tube, left PICC are stable. Interstitial edema has improved. Low lung volumes. No pneumothorax. No pleural effusion. IMPRESSION: Improving interstitial edema.  Stable support apparatus. Electronically Signed   By: Marybelle Killings M.D.   On: 05/03/2017 07:34     Medications:   . feeding supplement (VITAL HIGH PROTEIN) 1,000 mL (05/04/17 1400)  . fentaNYL infusion INTRAVENOUS Stopped (05/04/17 0801)  . insulin (NOVOLIN-R) infusion 6.7 Units/hr (05/04/17 1521)  . phenylephrine (NEO-SYNEPHRINE) Adult infusion Stopped (05/03/17 1115)  .  sodium bicarbonate (isotonic) infusion in sterile water 75 mL/hr at 05/04/17 1400  . vasopressin (PITRESSIN) infusion - *FOR SHOCK* Stopped (05/04/17 6808)   . budesonide (PULMICORT) nebulizer solution  0.25 mg Nebulization Q12H  . chlorhexidine gluconate (MEDLINE KIT)  15 mL Mouth Rinse BID  . feeding supplement (PRO-STAT SUGAR FREE 64)  30 mL Per Tube BID  . free water  200 mL Per Tube Q6H  . furosemide  40 mg Intravenous BID  . levalbuterol  1.25 mg Nebulization Q6H  . mouth rinse  15 mL Mouth Rinse 10 times per day  . methylPREDNISolone (SOLU-MEDROL) injection  40 mg Intravenous Q12H  .  multivitamin  15 mL Per Tube Daily  . nicotine  14 mg Transdermal Daily  . pantoprazole sodium  40 mg Per Tube Daily  . sodium chloride flush  10-40 mL Intracatheter Q12H  . spironolactone  25 mg Oral Daily  . vitamin C  500 mg Oral BID   acetaminophen **OR** acetaminophen, alum & mag hydroxide-simeth, bisacodyl, fentaNYL (SUBLIMAZE) injection, magnesium citrate, metoprolol tartrate, midazolam, morphine injection, ondansetron **OR** ondansetron (ZOFRAN) IV, polyethylene glycol, senna-docusate, sodium chloride flush  Assessment/ Plan:  [name] is a 62 y.o. white female with anemia, hypothyroidism, hyponatremia, COPD, gout, hypertension, chronic edema, hyperlipidemia, diabetes mellitus type 2, GERD admitted to [facility] on 04/11/2017 for Closed fracture of left hip, initial encounter ([place]) [S72.002A] Fall, initial encounter [W19.XXXA]   Prolonged hospitalization with acute respiratory failure on 3/8 and intubated and sedated.   1. 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  Acute renal failure with ATN - Continue to renally dose medications.  - Hold lisinopril.  - Continue spironolactone and furosemide.  - Monitor volume status. Not a candidate for long term dialysis. Discussed dialysis with patient's son. Overall prognosis is poor.   2. Hypernatremia: free water deficit 4.6 liters Continue furosemide as above - Continue free water flushes to 69m q 4 with tube feeds  3. Acute exacerbation of systolic congestive heart failure and respiratory failure requiring mechanical ventilation Echo 30-35%.     LOS: 1[place]3/12/20193:55 PM

## 2017-05-04 NOTE — Progress Notes (Signed)
Inpatient Diabetes Program Recommendations  AACE/ADA: New Consensus Statement on Inpatient Glycemic Control (2015)  Target Ranges:  Prepandial:   less than 140 mg/dL      Peak postprandial:   less than 180 mg/dL (1-2 hours)      Critically ill patients:  140 - 180 mg/dL   Results for DEMARI, KROPP (MRN [id number]) as of 05/04/2017 09:10  Ref. Range 05/03/2017 07:55 05/03/2017 12:24 05/03/2017 15:55 05/03/2017 19:55 05/03/2017 23:35 05/04/2017 04:03 05/04/2017 07:53  Glucose-Capillary Latest Ref Range: 65 - 99 mg/dL 209 (H) 276 (H) 231 (H) 301 (H) 287 (H) 307 (H) 353 (H)   Review of Glycemic Control  Diabetes history: DM2 Outpatient Diabetes medications: Lantus 30 units daily Current orders for Inpatient glycemic control: Lantus 25 units QHS, Novolog 0-20 units Q4H; Solumedrol 40 mg Q12H; Vital @ 40 ml/hr  Inpatient Diabetes Program Recommendations:  Insulin - Basal: If steroids are continued, please consider increasing Lantus to 30 units QHS. Insulin - Tube Feeding Coverage: Please consider ordering Novolog 4 units Q4H for tube feeding coverage. If tube feeding is stopped or held then Novolog tube feeding coverage would be as well.  Correction: If glucose does not improve with Lantus increase and tube feeding coverage (as recommended), please consider discontinuing all insulin orders and order ICU Glycemic Control Phase 2 (IV insulin) to improve glycemic control and determine insulin needs.  Thanks, Barnie Alderman, RN, MSN, CDE Diabetes Coordinator Inpatient Diabetes Program [phone] (Team Pager from 8am to 5pm)

## 2017-05-04 NOTE — Progress Notes (Signed)
Notified Dr Jenetta Downer with [facility] of transition of ICU Glycemic control to Phase 3.

## 2017-05-04 NOTE — Progress Notes (Signed)
Subjective:  Patient remains intubated.   Family at the bedside.    Objective:   VITALS:   Vitals:   05/04/17 0500 05/04/17 0600 05/04/17 0700 05/04/17 0800  BP: (!) 101/58 (!) 96/55 (!) 107/52 124/72  Pulse: (!) 101 (!) 101 (!) 101 (!) 118  Resp: 15 14 13 18   Temp:    97.9 F (36.6 C)  TempSrc:    Axillary  SpO2: 100% 100% 100% 100%  Weight:      Height:        PHYSICAL EXAM: Left lower extremity:  Bandages on left heal and calf for pressure ulcers.  Bandage overlying left hip remains C/D/I.   LABS  Results for orders placed or performed during the hospital encounter of 04/12/2017 (from the past 24 hour(s))  Glucose, capillary     Status: Abnormal   Collection Time: 05/03/17 12:24 PM  Result Value Ref Range   Glucose-Capillary 276 (H) 65 - 99 mg/dL  Glucose, capillary     Status: Abnormal   Collection Time: 05/03/17  3:55 PM  Result Value Ref Range   Glucose-Capillary 231 (H) 65 - 99 mg/dL  Magnesium     Status: Abnormal   Collection Time: 05/03/17  6:32 PM  Result Value Ref Range   Magnesium 2.6 (H) 1.7 - 2.4 mg/dL  Phosphorus     Status: None   Collection Time: 05/03/17  6:32 PM  Result Value Ref Range   Phosphorus 4.5 2.5 - 4.6 mg/dL  Glucose, capillary     Status: Abnormal   Collection Time: 05/03/17  7:55 PM  Result Value Ref Range   Glucose-Capillary 301 (H) 65 - 99 mg/dL  Glucose, capillary     Status: Abnormal   Collection Time: 05/03/17 11:35 PM  Result Value Ref Range   Glucose-Capillary 287 (H) 65 - 99 mg/dL  Glucose, capillary     Status: Abnormal   Collection Time: 05/04/17  4:03 AM  Result Value Ref Range   Glucose-Capillary 307 (H) 65 - 99 mg/dL  Magnesium     Status: Abnormal   Collection Time: 05/04/17  4:32 AM  Result Value Ref Range   Magnesium 2.6 (H) 1.7 - 2.4 mg/dL  Phosphorus     Status: Abnormal   Collection Time: 05/04/17  4:32 AM  Result Value Ref Range   Phosphorus 4.9 (H) 2.5 - 4.6 mg/dL  Renal function panel     Status:  Abnormal   Collection Time: 05/04/17  4:32 AM  Result Value Ref Range   Sodium 154 (H) 135 - 145 mmol/L   Potassium 3.4 (L) 3.5 - 5.1 mmol/L   Chloride 127 (H) 101 - 111 mmol/L   CO2 19 (L) 22 - 32 mmol/L   Glucose, Bld 381 (H) 65 - 99 mg/dL   BUN 107 (H) 6 - 20 mg/dL   Creatinine, Ser 3.09 (H) 0.44 - 1.00 mg/dL   Calcium 8.1 (L) 8.9 - 10.3 mg/dL   Phosphorus 4.9 (H) 2.5 - 4.6 mg/dL   Albumin 1.8 (L) 3.5 - 5.0 g/dL   GFR calc non Af Amer 15 (L) >60 mL/min   GFR calc Af Amer 17 (L) >60 mL/min   Anion gap 8 5 - 15  Platelet count     Status: Abnormal   Collection Time: 05/04/17  4:32 AM  Result Value Ref Range   Platelets 45 (L) 150 - 440 K/uL  Glucose, capillary     Status: Abnormal   Collection Time: 05/04/17  7:53 AM  Result Value Ref Range   Glucose-Capillary 353 (H) 65 - 99 mg/dL    Dg Chest Port 1 View  Result Date: 05/03/2017 CLINICAL DATA:  Respiratory failure EXAM: PORTABLE CHEST 1 VIEW COMPARISON:  05/02/2017 FINDINGS: Endotracheal tube, NG tube, left PICC are stable. Interstitial edema has improved. Low lung volumes. No pneumothorax. No pleural effusion. IMPRESSION: Improving interstitial edema.  Stable support apparatus. Electronically Signed   By: Marybelle Killings M.D.   On: 05/03/2017 07:34    Assessment/Plan: 13 Days Post-Op   Active Problems:   COPD with acute exacerbation (HCC)   Hip fracture (HCC)   Acute respiratory failure with hypoxia (HCC)   Acute pulmonary edema (HCC)   Malnutrition of moderate degree  PT recommended when medically stable and breathing improved.  Change dressings prn.  DVT prophylaxis per medical team.      Thornton Park , MD 05/04/2017, 10:40 AM

## 2017-05-04 NOTE — Progress Notes (Signed)
[facility] Critical Care Medicine Progess Note    SYNOPSIS   Samantha Mcmillan is a 62 year old female with past medical history remarkable for COPD, status post hip fracture with left hip hemiarthroplasty with acute on chronic respiratory failure with superimposed pulmonary edema was transferred to the intensive care unit.   ASSESSMENT/PLAN   Respiratory failure.  Patient's bronchospasm is improved.  Continue albuterol, Atrovent, Solu-Medrol, cefepime.  Has failed SBTs over the past several days. Family wishes [place] to continue trying and not ready to go comfort care. I related to them that I'm not sure much more is reversible at this time but will continue to try daily spontaneous awakening and breathing trials. Is being treated for pneumonia, bronchospasm has significantly improved  Renal failure. BUN and creatinine continue to increase, being followed by nephrology, BUN 107, creatinine 3.09  Hypernatremia. Slowly decreasing, was 156 now down to 154  Hypokalemia. Being replaced  Leukocytosis. Pending CBC from this morning  Status post hip fracture repair  Critical care time 35 minutes   VENTILATOR SETTINGS: Vent Mode: PRVC FiO2 (%):  [28 %] 28 % Set Rate:  [20 bmp] 20 bmp Vt Set:  [500 mL] 500 mL PEEP:  [5 cmH20] 5 cmH20 Plateau Pressure:  [19 cmH20-26 cmH20] 26 cmH20  INTAKE / OUTPUT:  Intake/Output Summary (Last 24 hours) at 05/04/2017 0902 Last data filed at 05/04/2017 0800 Gross per 24 hour  Intake 2807.68 ml  Output 1405 ml  Net 1402.68 ml    Name: Samantha Mcmillan MRN: [id number] DOB: 1955-06-25    ADMISSION DATE:  04/07/2017  SUBJECTIVE:   Pt currently on the ventilator, can not provide history or review of systems.   VITAL SIGNS: Temp:  [97.6 F (36.4 C)-99.6 F (37.6 C)] 97.9 F (36.6 C) (03/12 0800) Pulse Rate:  [94-124] 118 (03/12 0800) Resp:  [11-30] 18 (03/12 0800) BP: (89-141)/(52-96) 124/72 (03/12 0800) SpO2:  [100 %] 100 % (03/12 0800) FiO2  (%):  [28 %] 28 % (03/12 0755) Weight:  [76.3 kg (168 lb 3.4 oz)] 76.3 kg (168 lb 3.4 oz) (03/12 0406)  PHYSICAL EXAMINATION: Physical Examination:   VS: BP 124/72 (BP Location: Right Arm)   Pulse (!) 118   Temp 97.9 F (36.6 C) (Axillary)   Resp 18   Ht 5\' 4"  (1.626 m)   Wt 76.3 kg (168 lb 3.4 oz)   LMP  (LMP Unknown)   SpO2 100%   BMI 28.87 kg/m   General Appearance: patient is sedated on mechanical ventilation Neuro:Limited exam HEENT: patient is orally intubated, oral gastric tube in place, trachea is midline Pulmonary: diffuse bronchospasm appreciated on exam CardiovascularNormal S1,S2.   Abdomen: positive bowel sounds, soft exam. Skin:   warm, no rashes, no ecchymosis  Extremities: normal, no cyanosis, clubbing.    LABORATORY PANEL:   CBC Recent Labs  Lab 05/02/17 0731  WBC 9.4  HGB 7.5*  HCT 24.8*  PLT 66*    Chemistries  Recent Labs  Lab 05/04/17 0432  NA 154*  K 3.4*  CL 127*  CO2 19*  GLUCOSE 381*  BUN 107*  CREATININE 3.09*  CALCIUM 8.1*  MG 2.6*  PHOS 4.9*  4.9*    Recent Labs  Lab 05/03/17 1224 05/03/17 1555 05/03/17 1955 05/03/17 2335 05/04/17 0403 05/04/17 0753  GLUCAP 276* 231* 301* 287* 307* 353*   Recent Labs  Lab 04/30/17 1446 05/02/17 1340 05/03/17 0906  PHART 7.28* 7.28* 7.26*  PCO2ART 52* 42 40  PO2ART 127* 55* 60*  Recent Labs  Lab 05/04/17 0432  ALBUMIN 1.8*    Cardiac Enzymes No results for input(s): TROPONINI in the last 168 hours.  RADIOLOGY:  Dg Chest Port 1 View  Result Date: 05/03/2017 CLINICAL DATA:  Respiratory failure EXAM: PORTABLE CHEST 1 VIEW COMPARISON:  05/02/2017 FINDINGS: Endotracheal tube, NG tube, left PICC are stable. Interstitial edema has improved. Low lung volumes. No pneumothorax. No pleural effusion. IMPRESSION: Improving interstitial edema.  Stable support apparatus. Electronically Signed   By: Marybelle Killings M.D.   On: 05/03/2017 07:34   Dg Chest Port 1 View  Result Date:  05/02/2017 CLINICAL DATA:  Ventilator EXAM: PORTABLE CHEST 1 VIEW COMPARISON:  05/01/2017 FINDINGS: Endotracheal tube, left PICC line and NG tube are unchanged. Heart is upper limits normal in size. Continued interstitial prominence in the lungs, right greater than left, unchanged. No effusions or acute bony abnormality. IMPRESSION: Stable interstitial prominence within the lungs, right greater than left. Electronically Signed   By: Rolm Baptise M.D.   On: 05/02/2017 09:59   Hermelinda Dellen, DO  05/04/2017

## 2017-05-04 NOTE — Progress Notes (Signed)
[facility] [facility] [place] NAME: Samantha Mcmillan    MR#:  [id number]  DATE OF BIRTH:  Mar 21, 1955  SUBJECTIVE:  CHIEF COMPLAINT:   Chief Complaint  Patient presents with  . Hip Pain  . Fall  Patient became short of breath and was intubated 04/30/2017.  Patient's daughter at bed side  Pulmonology is going to do weaning trials again today.  Blood gases done patient is tachycardic  REVIEW OF SYSTEMS:    ROS not obtainable as the patient is intubated  DRUG ALLERGIES:   Allergies  Allergen Reactions  . Iodine Shortness Of Breath  . Latex Anaphylaxis  . Other Rash, Other (See Comments), Shortness Of Breath, Anaphylaxis and Swelling    Pt states that she is allergic to Darvocet.   . Oxycodone Shortness Of Breath, Rash and Swelling    Other reaction(s): Other (See Comments) wheezing  . Oxycodone-Acetaminophen Other (See Comments), Rash, Swelling and Shortness Of Breath    wheezing wheezing  . Shellfish-Derived Products Anaphylaxis, Rash, Shortness Of Breath and Swelling    Throat closes.  . Amitriptyline Other (See Comments)    Other reaction(s): Other (See Comments) Mental changes Other reaction(s): Other (See Comments) Altered mental status, agitation Reaction:  Mental changes   . Fish-Derived Products Other (See Comments) and Swelling    Other reaction(s): Other (See Comments) "respiratory distress and wheezing" "respiratory distress and wheezing"  . Tape Other (See Comments)    Other reaction(s): Other (See Comments) Pulls skin off Pt states that it pulls her skin off.  Pt states that it pulls her skin off.   . Wellbutrin [Bupropion] Other (See Comments)    Other reaction(s): Other (See Comments) Mental changes Reaction:  Mental changes   . Augmentin [Amoxicillin-Pot Clavulanate] Rash and Other (See Comments)    Has patient had a PCN reaction causing immediate rash, facial/tongue/throat swelling, SOB or lightheadedness with  hypotension: No Has patient had a PCN reaction causing severe rash involving mucus membranes or skin necrosis: No Has patient had a PCN reaction that required hospitalization No Has patient had a PCN reaction occurring within the last 10 years: No If all of the above answers are "NO", then may proceed with Cephalosporin use.  . Codeine Rash  . Cortisone Rash  . Diphenhydramine Rash  . Hydrocodone-Acetaminophen Rash    dizzy  . Vicodin [Hydrocodone-Acetaminophen] Rash and Other (See Comments)    Reaction:  Dizziness      VITALS:  Blood pressure 114/68, pulse (!) 121, temperature 98.8 F (37.1 C), temperature source Axillary, resp. rate 15, height 5\' 4"  (1.626 m), weight 76.3 kg (168 lb 3.4 oz), SpO2 98 %.  PHYSICAL EXAMINATION:  GENERAL:  62 y.o.-year-old patient lying in the bed with no acute distress.  EYES: Pupils equal, round, reactive to light and accommodation. No scleral icterus.  HEENT: Head atraumatic, normocephalic. OT intact NECK:  Supple, no jugular venous distention. No thyroid enlargement, no tenderness.  LUNGS: Mod breath sounds bilaterally, no wheezing, rales,rhonchi or crepitation. No use of accessory muscles of respiration.  CARDIOVASCULAR: S1, S2 normal. No murmurs, rubs, or gallops.  ABDOMEN: Soft, nontender, nondistended. Bowel sounds present.  EXTREMITIES: No pedal edema, cyanosis, or clubbing.  NEUROLOGIC: Intubated PSYCHIATRIC: The patient is sedated  sKIN: No obvious rash, lesion, or ulcer.   Physical Exam LABORATORY PANEL:   CBC Recent Labs  Lab 05/02/17 0731 05/04/17 0432  WBC 9.4  --   HGB 7.5*  --   HCT 24.8*  --  PLT 66* 45*   ------------------------------------------------------------------------------------------------------------------  Chemistries  Recent Labs  Lab 05/04/17 0432  NA 154*  K 3.4*  CL 127*  CO2 19*  GLUCOSE 381*  BUN 107*  CREATININE 3.09*  CALCIUM 8.1*  MG 2.6*    ------------------------------------------------------------------------------------------------------------------  Cardiac Enzymes No results for input(s): TROPONINI in the last 168 hours. ------------------------------------------------------------------------------------------------------------------  RADIOLOGY:  Dg Chest Port 1 View  Result Date: 05/03/2017 CLINICAL DATA:  Respiratory failure EXAM: PORTABLE CHEST 1 VIEW COMPARISON:  05/02/2017 FINDINGS: Endotracheal tube, NG tube, left PICC are stable. Interstitial edema has improved. Low lung volumes. No pneumothorax. No pleural effusion. IMPRESSION: Improving interstitial edema.  Stable support apparatus. Electronically Signed   By: Marybelle Killings M.D.   On: 05/03/2017 07:34    ASSESSMENT AND PLAN:  62 yo female w/ hx of DM, HTN, Hyperlipidemia, hx of Cirrhosis, Chronic Bronchitis, Anxiety, Anemia, GERD, Depression, PTSD, Chronic Diastolic CHF, hx of Panic Attack who presented to the hospital due to a fall and noted to have a Left Hip Fracture.   1. Acute hypoxic respiratory failure-secondary to  bronchospasm Intubated 3/8 Vent management per protocol, weaning trials as tolerated today by pulmonology, patient failed weaning trials in the past 2 days Secondary to bilateralHCAP,COPD,and CHFexacerbation  CT chest negative for PE, noted extensive bilateral groundglass opacities with broad differential, elevated pro-calcitonin level  Continue BTs, IV Solu-Medrol, mucolytic agents,RTfollowing,  pulmonologyfollowing, echo for EF 30-35%, cardiology input appreciated, continueLasix, ST following  2.Acute on chronic diastolic CHF with hypokalemia Replete electrolites and repeat BMP in a.m. Acute hypoxia noted this morning April 24, 2017 requiringtransfer to ICU for closer monitoring, transferred back to regular nursing floor bed on April 29, 2010,  was transferred back to the ICU on April 29, 2017 for acute respiratory  failure/hypoxia,chest x-ray noted for edema versus pneumonia/repeat was unchanged, CT chest noted ContinueCHFprotocol, Lasix,Aldactone, metoprolol,echo noted above, cardiology input appreciated  Echo from 2016with normal ejection fraction  3.AcutebilateralHCAP Resolving Chest x-ray, CT chest noted Continue pneumonia protocol,  patient was given cefepime   4.Acute on COPD exacerbation Stable  IV Solu-Medrol  tapered, inhaled corticosteroids, supplemental oxygenprn,mucolytic agents  5.S/p Fall and Left Hip Fracture s/pleft hip hemiarthroplastyby orthopedic surgery  6. Hxof gout Stable  Continue allopurinol  7.Hypertension,chronic Stable on current regiment  8.Chronic diabetes mellitus type 2 Stable on current regiment  9.Chronic tobacco smoking abuse/dependency Cessation counseling and nicotine patch daily  10.Noncompliant with medical management  Long-term prognosis is poor-palliative care input appreciated Discussed with pulmonology  All the records are reviewed and case discussed with Care Management/Social Workerr. Management plans discussed with the patient's x-husband at bed side  CODE STATUS: full  TOTAL TIME TAKING CARE OF THIS PATIENT: 33 minutes.     POSSIBLE D/C IN 2-5 DAYS, DEPENDING ON CLINICAL CONDITION.   Nicholes Mango M.D on 05/04/2017   Between 7am to 6pm - Pager - [phone]  After 6pm go to [email or web address] - password [email or web address] [facility]  Office  [phone]  CC: Primary care physician; Casilda Carls, MD  Note: This dictation was prepared with Dragon dictation along with smaller phrase technology. Any transcriptional errors that result from this process are unintentional.

## 2017-05-04 NOTE — Progress Notes (Signed)
Pharmacy ICU Medication Monitoring Consult:  Pharmacy consulted to assist in monitoring and replacing electrolytes in this 62 y.o. female admitted on 04/22/2017 with Hip Pain and Fall  Patient currently requiring mechanical ventilation.   Labs:  Sodium (mmol/L)  Date Value  05/04/2017 154 (H)  05/31/2014 128 (A)  03/15/2014 136   Potassium (mmol/L)  Date Value  05/04/2017 3.4 (L)  03/15/2014 3.1 (L)   Magnesium (mg/dL)  Date Value  05/04/2017 2.6 (H)  03/15/2014 1.6 (L)   Phosphorus (mg/dL)  Date Value  05/04/2017 4.9 (H)  05/04/2017 4.9 (H)   Calcium (mg/dL)  Date Value  05/04/2017 8.1 (L)   Calcium, Total (mg/dL)  Date Value  03/15/2014 7.7 (L)   Albumin (g/dL)  Date Value  05/04/2017 1.8 (L)  03/15/2014 2.1 (L)    Plan:  1. Electrolytes:  Will order Potassium chloride 20 meq PO x 1.  Patient on furosemide 40 IV BID.  2. Glucose: Phase 2 Hyperglycemia protocol.   3. Constipation: Lactulose stopped, Will order follow up ammonia for 3/13.   Pharmacy will continue to monitor and adjust per consult.   Annice Jolly L 05/04/2017 5:26 PM

## 2017-05-05 ENCOUNTER — Inpatient Hospital Stay: Payer: Medicare Other

## 2017-05-05 DIAGNOSIS — L8990 Pressure ulcer of unspecified site, unspecified stage: Secondary | ICD-10-CM

## 2017-05-05 LAB — BLOOD GAS, ARTERIAL
Acid-base deficit: 6.1 mmol/L — ABNORMAL HIGH (ref 0.0–2.0)
Bicarbonate: 21.7 mmol/L (ref 20.0–28.0)
Expiratory PAP: 5
FIO2: 0.4
INSPIRATORY PAP: 5
O2 Saturation: 89.8 %
PO2 ART: 70 mmHg — AB (ref 83.0–108.0)
Patient temperature: 37
pCO2 arterial: 53 mmHg — ABNORMAL HIGH (ref 32.0–48.0)
pH, Arterial: 7.22 — ABNORMAL LOW (ref 7.350–7.450)

## 2017-05-05 LAB — CBC
HEMATOCRIT: 27.5 % — AB (ref 35.0–47.0)
HEMATOCRIT: 26.8 % — AB (ref 35.0–47.0)
HEMOGLOBIN: 7.9 g/dL — AB (ref 12.0–16.0)
Hemoglobin: 8.4 g/dL — ABNORMAL LOW (ref 12.0–16.0)
MCH: 30.5 pg (ref 26.0–34.0)
MCH: 29.8 pg (ref 26.0–34.0)
MCHC: 30.5 g/dL — ABNORMAL LOW (ref 32.0–36.0)
MCHC: 29.3 g/dL — AB (ref 32.0–36.0)
MCV: 100.1 fL — AB (ref 80.0–100.0)
MCV: 101.8 fL — ABNORMAL HIGH (ref 80.0–100.0)
PLATELETS: 66 10*3/uL — AB (ref 150–440)
Platelets: 88 10*3/uL — ABNORMAL LOW (ref 150–440)
RBC: 2.75 MIL/uL — AB (ref 3.80–5.20)
RBC: 2.64 MIL/uL — ABNORMAL LOW (ref 3.80–5.20)
RDW: 17 % — ABNORMAL HIGH (ref 11.5–14.5)
RDW: 17.9 % — ABNORMAL HIGH (ref 11.5–14.5)
WBC: 19.4 10*3/uL — AB (ref 3.6–11.0)
WBC: 24.3 10*3/uL — ABNORMAL HIGH (ref 3.6–11.0)

## 2017-05-05 LAB — RENAL FUNCTION PANEL
ALBUMIN: 1.9 g/dL — AB (ref 3.5–5.0)
ANION GAP: 10 (ref 5–15)
BUN: 136 mg/dL — ABNORMAL HIGH (ref 6–20)
CO2: 24 mmol/L (ref 22–32)
Calcium: 7.9 mg/dL — ABNORMAL LOW (ref 8.9–10.3)
Chloride: 115 mmol/L — ABNORMAL HIGH (ref 101–111)
Creatinine, Ser: 4.02 mg/dL — ABNORMAL HIGH (ref 0.44–1.00)
GFR calc Af Amer: 13 mL/min — ABNORMAL LOW (ref >60)
GFR calc non Af Amer: 11 mL/min — ABNORMAL LOW (ref >60)
GLUCOSE: 295 mg/dL — AB (ref 65–99)
PHOSPHORUS: 5.5 mg/dL — AB (ref 2.5–4.6)
POTASSIUM: 4.2 mmol/L (ref 3.5–5.1)
Sodium: 149 mmol/L — ABNORMAL HIGH (ref 135–145)

## 2017-05-05 LAB — BASIC METABOLIC PANEL
Anion gap: 9 (ref 5–15)
BUN: 119 mg/dL — ABNORMAL HIGH (ref 6–20)
CHLORIDE: 118 mmol/L — AB (ref 101–111)
CO2: 22 mmol/L (ref 22–32)
Calcium: 8.3 mg/dL — ABNORMAL LOW (ref 8.9–10.3)
Creatinine, Ser: 3.54 mg/dL — ABNORMAL HIGH (ref 0.44–1.00)
GFR calc non Af Amer: 13 mL/min — ABNORMAL LOW (ref >60)
GFR, EST AFRICAN AMERICAN: 15 mL/min — AB (ref >60)
Glucose, Bld: 255 mg/dL — ABNORMAL HIGH (ref 65–99)
POTASSIUM: 3.9 mmol/L (ref 3.5–5.1)
Sodium: 149 mmol/L — ABNORMAL HIGH (ref 135–145)

## 2017-05-05 LAB — GLUCOSE, CAPILLARY
GLUCOSE-CAPILLARY: 198 mg/dL — AB (ref 65–99)
Glucose-Capillary: 225 mg/dL — ABNORMAL HIGH (ref 65–99)
Glucose-Capillary: 235 mg/dL — ABNORMAL HIGH (ref 65–99)
Glucose-Capillary: 241 mg/dL — ABNORMAL HIGH (ref 65–99)
Glucose-Capillary: 281 mg/dL — ABNORMAL HIGH (ref 65–99)
Glucose-Capillary: 286 mg/dL — ABNORMAL HIGH (ref 65–99)

## 2017-05-05 LAB — HEMOGLOBIN AND HEMATOCRIT, BLOOD
HEMATOCRIT: 27.7 % — AB (ref 35.0–47.0)
Hemoglobin: 8.3 g/dL — ABNORMAL LOW (ref 12.0–16.0)

## 2017-05-05 LAB — AMMONIA: Ammonia: 47 umol/L — ABNORMAL HIGH (ref 9–35)

## 2017-05-05 MED ORDER — SODIUM CHLORIDE 0.9 % IV SOLN
0.0000 ug/min | INTRAVENOUS | Status: DC
  Administered 2017-05-05: 350 ug/min via INTRAVENOUS
  Administered 2017-05-05: 20 ug/min via INTRAVENOUS
  Administered 2017-05-05: 250 ug/min via INTRAVENOUS
  Filled 2017-05-05: qty 4
  Filled 2017-05-05 (×2): qty 40

## 2017-05-05 MED ORDER — SODIUM CHLORIDE 0.9 % IV SOLN
0.0000 ug/min | INTRAVENOUS | Status: DC
  Administered 2017-05-06: 20 ug/min via INTRAVENOUS
  Filled 2017-05-05: qty 4

## 2017-05-05 MED ORDER — INSULIN ASPART 100 UNIT/ML ~~LOC~~ SOLN
0.0000 [IU] | SUBCUTANEOUS | Status: DC
  Administered 2017-05-05 (×2): 8 [IU] via SUBCUTANEOUS
  Administered 2017-05-05: 5 [IU] via SUBCUTANEOUS
  Administered 2017-05-05 – 2017-05-06 (×7): 3 [IU] via SUBCUTANEOUS
  Administered 2017-05-06 – 2017-05-07 (×2): 5 [IU] via SUBCUTANEOUS
  Filled 2017-05-05 (×12): qty 1

## 2017-05-05 MED ORDER — VASOPRESSIN 20 UNIT/ML IV SOLN
0.0300 [IU]/min | INTRAVENOUS | Status: DC
  Administered 2017-05-05 – 2017-05-06 (×2): 0.03 [IU]/min via INTRAVENOUS
  Filled 2017-05-05 (×2): qty 2

## 2017-05-05 MED ORDER — NOREPINEPHRINE BITARTRATE 1 MG/ML IV SOLN
0.0000 ug/min | INTRAVENOUS | Status: DC
  Filled 2017-05-05: qty 16

## 2017-05-05 MED ORDER — HEPARIN SODIUM (PORCINE) 1000 UNIT/ML DIALYSIS
1000.0000 [IU] | INTRAMUSCULAR | Status: DC | PRN
  Filled 2017-05-05: qty 6

## 2017-05-05 MED ORDER — PUREFLOW DIALYSIS SOLUTION: INTRAVENOUS | Status: DC

## 2017-05-05 NOTE — Progress Notes (Signed)
Pt completed 3 hr of PSV 5/5 vent wean. Pt placed back on resting settings of RR 20 Vt 450 Peep 5 FiO2 35% based on abg results

## 2017-05-05 NOTE — Progress Notes (Signed)
[facility] Critical Care Medicine Progess Note    SYNOPSIS   Samantha Mcmillan is a 62 year old female with past medical history remarkable for COPD, status post hip fracture with left hip hemiarthroplasty with acute on chronic respiratory failure with superimposed pulmonary edema was transferred to the intensive care unit.   ASSESSMENT/PLAN   Respiratory failure.  Patient's bronchospasm is improved.  Continue albuterol, Atrovent, Solu-Medrol, cefepime.  Has failed SBTs over the past several days. Family wishes [place] to continue trying and not ready to go comfort care. I related to them that I'm not sure much more is reversible at this time but will continue to try daily spontaneous awakening and breathing trials. Is being treated for pneumonia, bronchospasm has significantly improved  Renal failure. BUN and creatinine continue to increase, being followed by nephrology, 119 / 3.54  Leukocytosis. Off of antibiotics at this time will follow closely  Status post hip fracture repair  Will need to discuss with family. Worsening BUN/creatinine.  Critical care time 35 minutes   VENTILATOR SETTINGS: Vent Mode: PSV FiO2 (%):  [35 %] 35 % Set Rate:  [20 bmp] 20 bmp Vt Set:  [500 mL] 500 mL PEEP:  [5 cmH20] 5 cmH20 Pressure Support:  [5 cmH20-10 cmH20] 5 cmH20 Plateau Pressure:  [14 cmH20-23 cmH20] 17 cmH20  INTAKE / OUTPUT:  Intake/Output Summary (Last 24 hours) at 05/05/2017 0800 Last data filed at 05/05/2017 0600 Gross per 24 hour  Intake 2536.4 ml  Output 755 ml  Net 1781.4 ml    Name: Samantha Mcmillan MRN: [id number] DOB: 09/07/1955    ADMISSION DATE:  03/29/2017  SUBJECTIVE:   Pt currently on the ventilator, can not provide history or review of systems.   VITAL SIGNS: Temp:  [97.4 F (36.3 C)-98.8 F (37.1 C)] 97.4 F (36.3 C) (03/12 2000) Pulse Rate:  [101-148] 123 (03/13 0726) Resp:  [11-24] 22 (03/13 0726) BP: (87-129)/(50-78) 107/62 (03/13 0700) SpO2:  [98 %-100 %]  100 % (03/13 0726) FiO2 (%):  [35 %] 35 % (03/13 0714) Weight:  [77.2 kg (170 lb 3.1 oz)] 77.2 kg (170 lb 3.1 oz) (03/13 0442)  PHYSICAL EXAMINATION: Physical Examination:   VS: BP 107/62   Pulse (!) 123   Temp (!) 97.4 F (36.3 C) (Oral)   Resp (!) 22   Ht 5\' 4"  (1.626 m)   Wt 77.2 kg (170 lb 3.1 oz)   LMP  (LMP Unknown)   SpO2 100%   BMI 29.21 kg/m   General Appearance: patient is sedated on mechanical ventilation Neuro:Limited exam HEENT: patient is orally intubated, oral gastric tube in place, trachea is midline Pulmonary: diffuse bronchospasm appreciated on exam CardiovascularNormal S1,S2.   Abdomen: positive bowel sounds, soft exam. Skin:   warm, no rashes, no ecchymosis  Extremities: normal, no cyanosis, clubbing.    LABORATORY PANEL:   CBC Recent Labs  Lab 05/05/17 0510  WBC 19.4*  HGB 8.4*  HCT 27.5*  PLT 66*    Chemistries  Recent Labs  Lab 05/04/17 0432 05/05/17 0510  NA 154* 149*  K 3.4* 3.9  CL 127* 118*  CO2 19* 22  GLUCOSE 381* 255*  BUN 107* 119*  CREATININE 3.09* 3.54*  CALCIUM 8.1* 8.3*  MG 2.6*  --   PHOS 4.9*  4.9*  --     Recent Labs  Lab 05/04/17 2123 05/04/17 2227 05/04/17 2327 05/05/17 0027 05/05/17 0353 05/05/17 0728  GLUCAP 133* 149* 222* 225* 235* 241*   Recent Labs  Lab 05/03/17  4996 05/04/17 1130 05/05/17 0631  PHART 7.26* 7.16* 7.30*  PCO2ART 40 45 41  PO2ART 60* 50* 73*   Recent Labs  Lab 05/04/17 0432  ALBUMIN 1.8*    Cardiac Enzymes No results for input(s): TROPONINI in the last 168 hours.  RADIOLOGY:  No results found. Hermelinda Dellen, DO  05/05/2017

## 2017-05-05 NOTE — Progress Notes (Signed)
Pharmacy ICU Medication Monitoring Consult:  Pharmacy consulted to assist in monitoring and replacing electrolytes in this 62 y.o. female admitted on 04/22/2017 with Hip Pain and Fall  Patient currently requiring mechanical ventilation. Patient to undergo trial of dialysis 3/13 after access obtained.   Labs:  Sodium (mmol/L)  Date Value  05/05/2017 149 (H)  05/31/2014 128 (A)  03/15/2014 136   Potassium (mmol/L)  Date Value  05/05/2017 3.9  03/15/2014 3.1 (L)   Magnesium (mg/dL)  Date Value  05/04/2017 2.6 (H)  03/15/2014 1.6 (L)   Phosphorus (mg/dL)  Date Value  05/04/2017 4.9 (H)  05/04/2017 4.9 (H)   Calcium (mg/dL)  Date Value  05/05/2017 8.3 (L)   Calcium, Total (mg/dL)  Date Value  03/15/2014 7.7 (L)   Albumin (g/dL)  Date Value  05/04/2017 1.8 (L)  03/15/2014 2.1 (L)    Plan:  1. Electrolytes:  No replacement warranted. As patient is progressing to dialysis, will defer to nephrology for future replacement.   2. Glucose: Lantus 30 units Q24r, Novolog 4 units q4hr while on tube feeds, SSI.   3. Constipation: Will continue to monitor.   Pharmacy will continue to monitor and adjust per consult.   Jacquie Lukes L 05/05/2017 4:43 PM

## 2017-05-05 NOTE — Progress Notes (Signed)
[facility]  ROUNDING NOTE   Subjective:   Family at bedside. Family meeting this morning.   Patient off vasopressors. Anuric.   Na 149 (154)  BUN 119 (107)  Bicarb gtt  Objective:  Vital signs in last 24 hours:  Temp:  [97.4 F (36.3 C)-100.3 F (37.9 C)] 100.3 F (37.9 C) (03/13 0800) Pulse Rate:  [101-125] 125 (03/13 0800) Resp:  [11-24] 23 (03/13 0800) BP: (87-129)/(50-71) 112/68 (03/13 0800) SpO2:  [100 %] 100 % (03/13 1054) FiO2 (%):  [35 %] 35 % (03/13 1054) Weight:  [77.2 kg (170 lb 3.1 oz)] 77.2 kg (170 lb 3.1 oz) (03/13 0442)  Weight change: 0.9 kg (1 lb 15.7 oz) Filed Weights   05/03/17 0530 05/04/17 0406 05/05/17 0442  Weight: 73.7 kg (162 lb 7.7 oz) 76.3 kg (168 lb 3.4 oz) 77.2 kg (170 lb 3.1 oz)    Intake/Output: I/O last 3 completed shifts: In: 4197.2 [I.V.:2417.2; NG/GT:1780] Out: 1310 [Urine:310; Stool:1000]   Intake/Output this shift:  Total I/O In: 271.6 [I.V.:191.6; NG/GT:80] Out: -   Physical Exam: General: Critically ill  Head: ETT OGT  Eyes: Anicteric, PERRL  Neck: Supple   Lungs:  PRVC FiO2 35%  Heart: Tachycardia, sinus  Abdomen:  Soft, nontender  Extremities: no peripheral edema.  Neurologic: Intubated, sedated  Skin: No lesions  Access: none    Basic Metabolic Panel: Recent Labs  Lab 05/01/17 0458 05/02/17 0731 05/02/17 1700 05/03/17 0558 05/03/17 1832 05/04/17 0432 05/05/17 0510  NA 149* 154*  --  156*  --  154* 149*  K 3.8 3.4*  --  3.5  --  3.4* 3.9  CL 119* 125*  --  127*  --  127* 118*  CO2 20* 20*  --  17*  --  19* 22  GLUCOSE 180* 193*  --  233*  --  381* 255*  BUN 94* 95*  --  100*  --  107* 119*  CREATININE 2.22* 2.27*  --  2.54*  --  3.09* 3.54*  CALCIUM 8.0* 8.3*  --  8.3*  --  8.1* 8.3*  MG 2.4  --  2.5* 2.5* 2.6* 2.6*  --   PHOS 4.6  --  4.5 4.2 4.5 4.9*  4.9*  --     Liver Function Tests: Recent Labs  Lab 05/04/17 0432  ALBUMIN 1.8*   No results for input(s): LIPASE, AMYLASE  in the last 168 hours. Recent Labs  Lab 05/02/17 0731 05/02/17 0842 05/05/17 0824  AMMONIA 48* 31 47*    CBC: Recent Labs  Lab 05/01/17 0458 05/02/17 0731 05/04/17 0432 05/05/17 0510  WBC 19.4* 9.4  --  19.4*  HGB 7.7* 7.5*  --  8.4*  HCT 25.3* 24.8*  --  27.5*  MCV 100.6* 100.5*  --  100.1*  PLT 117* 66* 45* 66*    Cardiac Enzymes: No results for input(s): CKTOTAL, CKMB, CKMBINDEX, TROPONINI in the last 168 hours.  BNP: Invalid input(s): POCBNP  CBG: Recent Labs  Lab 05/04/17 2227 05/04/17 2327 05/05/17 0027 05/05/17 0353 05/05/17 0728  GLUCAP 149* 222* 225* 235* 241*    Microbiology: Results for orders placed or performed during the hospital encounter of 04/07/2017  Surgical PCR screen     Status: None   Collection Time: 04/07/2017 12:09 AM  Result Value Ref Range Status   MRSA, PCR NEGATIVE NEGATIVE Final   Staphylococcus aureus NEGATIVE NEGATIVE Final    Comment: (NOTE) The Xpert SA Assay (FDA approved for NASAL specimens in  patients 35 years of age and older), is one component of a comprehensive surveillance program. It is not intended to diagnose infection nor to guide or monitor treatment. Performed at [facility], [address] [place], [place] [address]   Culture, blood (routine x 2) Call MD if unable to obtain prior to antibiotics being given     Status: None   Collection Time: 04/24/17 10:40 AM  Result Value Ref Range Status   Specimen Description BLOOD BLOOD RIGHT ARM  Final   Special Requests   Final    BOTTLES DRAWN AEROBIC AND ANAEROBIC Blood Culture adequate volume   Culture   Final    NO GROWTH 5 DAYS Performed at [facility], [address] [place], [place] [address]    Report Status 04/29/2017 FINAL  Final  Culture, blood (routine x 2) Call MD if unable to obtain prior to antibiotics being given     Status: None   Collection Time: 04/24/17 10:55 AM  Result Value Ref Range Status   Specimen Description BLOOD  BLOOD LEFT HAND  Final   Special Requests   Final    BOTTLES DRAWN AEROBIC AND ANAEROBIC Blood Culture adequate volume   Culture   Final    NO GROWTH 5 DAYS Performed at [facility], [address] [place], [place] [address]    Report Status 04/29/2017 FINAL  Final    Coagulation Studies: No results for input(s): LABPROT, INR in the last 72 hours.  Urinalysis: No results for input(s): COLORURINE, LABSPEC, PHURINE, GLUCOSEU, HGBUR, BILIRUBINUR, KETONESUR, PROTEINUR, UROBILINOGEN, NITRITE, LEUKOCYTESUR in the last 72 hours.  Invalid input(s): APPERANCEUR    Imaging: Dg Chest Port 1 View  Result Date: 05/05/2017 CLINICAL DATA:  Status post intubation EXAM: PORTABLE CHEST 1 VIEW COMPARISON:  05/03/2017 FINDINGS: Endotracheal tube and nasogastric catheter are again noted in satisfactory position. Left-sided PICC line is stable in position. The cardiac shadow is unremarkable. The lungs demonstrate some diffuse interstitial changes stable from the prior exam. No new focal abnormality is seen. IMPRESSION: Stable interstitial changes when compared with the prior study. Tubes and lines as described above. Electronically Signed   By: Inez Catalina M.D.   On: 05/05/2017 09:59     Medications:   . feeding supplement (VITAL HIGH PROTEIN) 1,000 mL (05/05/17 0800)  . fentaNYL infusion INTRAVENOUS Stopped (05/04/17 0801)  .  sodium bicarbonate (isotonic) infusion in sterile water 75 mL/hr at 05/05/17 0800   . budesonide (PULMICORT) nebulizer solution  0.25 mg Nebulization Q12H  . chlorhexidine gluconate (MEDLINE KIT)  15 mL Mouth Rinse BID  . feeding supplement (PRO-STAT SUGAR FREE 64)  30 mL Per Tube BID  . free water  200 mL Per Tube Q6H  . insulin aspart  0-15 Units Subcutaneous Q4H  . levalbuterol  1.25 mg Nebulization Q6H  . mouth rinse  15 mL Mouth Rinse 10 times per day  . methylPREDNISolone (SOLU-MEDROL) injection  40 mg Intravenous Q12H  . multivitamin  15 mL Per Tube Daily   . nicotine  14 mg Transdermal Daily  . pantoprazole sodium  40 mg Per Tube Daily  . sodium chloride flush  10-40 mL Intracatheter Q12H  . vitamin C  500 mg Oral BID   acetaminophen **OR** acetaminophen, alum & mag hydroxide-simeth, bisacodyl, fentaNYL (SUBLIMAZE) injection, magnesium citrate, metoprolol tartrate, midazolam, morphine injection, ondansetron **OR** ondansetron (ZOFRAN) IV, polyethylene glycol, senna-docusate, sodium chloride flush  Assessment/ Plan:  Samantha Mcmillan is a 62 y.o. white  female with anemia, hypothyroidism, hyponatremia, COPD, gout, hypertension, chronic edema, hyperlipidemia, diabetes mellitus type 2, GERD admitted to [facility] on 04/08/2017 for Closed fracture of left hip, initial encounter (Philip) [S72.002A] Fall, initial encounter [W19.XXXA]   Prolonged hospitalization with acute respiratory failure on 3/8 and intubated and sedated.   1. 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  Acute renal failure with ATN - Continue to renally dose medications.  - Hold lisinopril. Hold spironolactone and furosemide.  - Discussed dialysis with patient's family. Overall prognosis is poor. They want to do a trial of dialysis.  - Critical care to place dialysis catheter. Will plan on short dialysis treatment later today.   2. Hypernatremia: free water deficit improving Continue furosemide as above - Continue free water flushes to 12m q 4 with tube feeds  3. Acute exacerbation of systolic congestive heart failure and respiratory failure requiring mechanical ventilation Echo EF 30-35%.     LOS: 15 Donae Kueker 3/13/201912:12 PM

## 2017-05-05 NOTE — Progress Notes (Signed)
[facility] [place] NAME: Samantha Mcmillan    MR#:  [id number]  DATE OF BIRTH:  May 30, 1955  SUBJECTIVE:  CHIEF COMPLAINT:   Chief Complaint  Patient presents with  . Hip Pain  . Fall  Patient remains intubated   REVIEW OF SYSTEMS:    ROS not obtainable as the patient is intubated  DRUG ALLERGIES:   Allergies  Allergen Reactions  . Iodine Shortness Of Breath  . Latex Anaphylaxis  . Other Rash, Other (See Comments), Shortness Of Breath, Anaphylaxis and Swelling    Pt states that she is allergic to Darvocet.   . Oxycodone Shortness Of Breath, Rash and Swelling    Other reaction(s): Other (See Comments) wheezing  . Oxycodone-Acetaminophen Other (See Comments), Rash, Swelling and Shortness Of Breath    wheezing wheezing  . Shellfish-Derived Products Anaphylaxis, Rash, Shortness Of Breath and Swelling    Throat closes.  . Amitriptyline Other (See Comments)    Other reaction(s): Other (See Comments) Mental changes Other reaction(s): Other (See Comments) Altered mental status, agitation Reaction:  Mental changes   . Fish-Derived Products Other (See Comments) and Swelling    Other reaction(s): Other (See Comments) "respiratory distress and wheezing" "respiratory distress and wheezing"  . Tape Other (See Comments)    Other reaction(s): Other (See Comments) Pulls skin off Pt states that it pulls her skin off.  Pt states that it pulls her skin off.   . Wellbutrin [Bupropion] Other (See Comments)    Other reaction(s): Other (See Comments) Mental changes Reaction:  Mental changes   . Augmentin [Amoxicillin-Pot Clavulanate] Rash and Other (See Comments)    Has patient had a PCN reaction causing immediate rash, facial/tongue/throat swelling, SOB or lightheadedness with hypotension: No Has patient had a PCN reaction causing severe rash involving mucus membranes or skin necrosis: No Has patient had a PCN reaction that required  hospitalization No Has patient had a PCN reaction occurring within the last 10 years: No If all of the above answers are "NO", then may proceed with Cephalosporin use.  . Codeine Rash  . Cortisone Rash  . Diphenhydramine Rash  . Hydrocodone-Acetaminophen Rash    dizzy  . Vicodin [Hydrocodone-Acetaminophen] Rash and Other (See Comments)    Reaction:  Dizziness      VITALS:  Blood pressure (!) 86/63, pulse (!) 123, temperature 100.1 F (37.8 C), temperature source Axillary, resp. rate 19, height 5\' 4"  (1.626 m), weight 174 lb 2.6 oz (79 kg), SpO2 100 %.  PHYSICAL EXAMINATION:  GENERAL:  62 y.o.-year-old patient lying in the bed intubated and sedated EYES: Pupils equal, round, reactive to light and accommodation. No scleral icterus.  HEENT: Head atraumatic, normocephalic. OT intact NECK:  Supple, no jugular venous distention. No thyroid enlargement, no tenderness.  LUNGS: Mod breath sounds bilaterally, no wheezing, rales,rhonchi or crepitation. No use of accessory muscles of respiration.  CARDIOVASCULAR: S1, S2 normal. No murmurs, rubs, or gallops.  ABDOMEN: Soft, nontender, nondistended. Bowel sounds present.  EXTREMITIES: No pedal edema, cyanosis, or clubbing.  NEUROLOGIC: Intubated PSYCHIATRIC: The patient is sedated  sKIN: No obvious rash, lesion, or ulcer.   Physical Exam LABORATORY PANEL:   CBC Recent Labs  Lab 05/05/17 1541  WBC 24.3*  HGB 7.9*  HCT 26.8*  PLT 88*   ------------------------------------------------------------------------------------------------------------------  Chemistries  Recent Labs  Lab 05/04/17 0432  05/05/17 1541  NA 154*   < > 149*  K 3.4*   < > 4.2  CL 127*   < >  115*  CO2 19*   < > 24  GLUCOSE 381*   < > 295*  BUN 107*   < > 136*  CREATININE 3.09*   < > 4.02*  CALCIUM 8.1*   < > 7.9*  MG 2.6*  --   --    < > = values in this interval not displayed.    ------------------------------------------------------------------------------------------------------------------  Cardiac Enzymes No results for input(s): TROPONINI in the last 168 hours. ------------------------------------------------------------------------------------------------------------------  RADIOLOGY:  Dg Chest Port 1 View  Result Date: 05/05/2017 CLINICAL DATA:  Unsuccessful central line placement. Evaluate for pneumothorax. EXAM: PORTABLE CHEST 1 VIEW COMPARISON:  Chest x-ray from same day at 9:31 a.m. FINDINGS: Unchanged endotracheal tube, enteric tube, and left upper extremity PICC line. The heart size and mediastinal contours are within normal limits. Unchanged interstitial edema. No focal consolidation, pleural effusion, or pneumothorax. No acute osseous abnormality. IMPRESSION: 1. No pneumothorax. 2. Unchanged interstitial pulmonary edema. Electronically Signed   By: Titus Dubin M.D.   On: 05/05/2017 13:02   Dg Chest Port 1 View  Result Date: 05/05/2017 CLINICAL DATA:  Status post intubation EXAM: PORTABLE CHEST 1 VIEW COMPARISON:  05/03/2017 FINDINGS: Endotracheal tube and nasogastric catheter are again noted in satisfactory position. Left-sided PICC line is stable in position. The cardiac shadow is unremarkable. The lungs demonstrate some diffuse interstitial changes stable from the prior exam. No new focal abnormality is seen. IMPRESSION: Stable interstitial changes when compared with the prior study. Tubes and lines as described above. Electronically Signed   By: Inez Catalina M.D.   On: 05/05/2017 09:59    ASSESSMENT AND PLAN:  62 yo female w/ hx of DM, HTN, Hyperlipidemia, hx of Cirrhosis, Chronic Bronchitis, Anxiety, Anemia, GERD, Depression, PTSD, Chronic Diastolic CHF, hx of Panic Attack who presented to the hospital due to a fall and noted to have a Left Hip Fracture.   1. Acute hypoxic respiratory failure-secondary to  bronchospasm Intubated 3/8 Vent  management per protocol, weaning trials as tolerated today by pulmonology, patient failed weaning trials in the past 2 days Secondary to bilateralHCAP,COPD,and CHFexacerbation  CT chest negative for PE, noted extensive bilateral groundglass opacities with broad differential, elevated pro-calcitonin level  Continue antibiotics, IV Solu-Medrol, mucolytic agents,RTfollowing,  pulmonologyfollowing, echo for EF 30-35%, cardiology input appreciated, continueLasix,   2.Acute on chronic diastolic CHF with hypokalemia Continue current care  3.AcutebilateralHCAP Resolving Chest x-ray, CT chest noted Continue pneumonia protocol,  patient was given cefepime   4.Acute on COPD exacerbation Stable  IV Solu-Medrol  tapered, inhaled corticosteroids, supplemental oxygenprn,mucolytic agents  5.S/p Fall and Left Hip Fracture s/pleft hip hemiarthroplastyby orthopedic surgery  6. Hxof gout Stable  Continue allopurinol  7.Hypertension,chronic Stable on current regiment  8.Chronic diabetes mellitus type 2 Stable on current regiment  9.Chronic tobacco smoking abuse/dependency Cessation counseling and nicotine patch daily  10.Noncompliant with medical management  Long-term prognosis is poor-palliative care input appreciated Discussed with pulmonology  All the records are reviewed and case discussed with Care Management/Social Workerr. Management plans discussed with the patient's x-husband at bed side  CODE STATUS: full  TOTAL TIME TAKING CARE OF THIS PATIENT: 33 minutes.     POSSIBLE D/C pending extubation   Dustin Flock M.D on 05/05/2017   Between 7am to 6pm - Pager - [phone]  After 6pm go to [email or web address] - password [email or web address] [facility]  Office  [phone]  CC: Primary care physician; Casilda Carls, MD  Note: This dictation was prepared with Dragon dictation along with  smaller phrase technology. Any  transcriptional errors that result from this process are unintentional.

## 2017-05-05 NOTE — Care Management (Addendum)
LTAC screening requested from [employer]. [facility] can offer. Waiting for [employer] to review/respond. 1516: per [employer] patient meets criteria however [employer] requires trach and potentially a 21 day stay.

## 2017-05-05 NOTE — Progress Notes (Signed)
HD tx end. Tx terminated early r/t decreased BP despite pt being on a NEO drip. MD made aware, 18 minutes left in tx.    05/05/17 1855  Vital Signs  Pulse Rate (!) 147  Pulse Rate Source Monitor  Resp (!) 22  BP (!) 113/59  BP Location Right Arm  BP Method Automatic  Patient Position (if appropriate) Lying  Oxygen Therapy  SpO2 100 %  O2 Device Ventilator  Pulse Oximetry Type Continuous  End Tidal CO2 (EtCO2) 22  During Hemodialysis Assessment  Dialysis Fluid Bolus Normal Saline  Bolus Amount (mL) 250 mL  Intra-Hemodialysis Comments Tx completed

## 2017-05-05 NOTE — Progress Notes (Signed)
Post HD assessment   05/05/17 1912  Neurological  Level of Consciousness Responds to Pain  Orientation Level Intubated/Tracheostomy - Unable to assess  Respiratory  Respiratory Pattern Regular;Unlabored  Chest Assessment Chest expansion symmetrical  Cardiac  Cardiac Rhythm ST  Vascular  R Radial Pulse +2  L Radial Pulse +2  Integumentary  Integumentary (WDL) X  Skin Color Appropriate for ethnicity  Musculoskeletal  Musculoskeletal (WDL) X  Generalized Weakness Yes  Assistive Device None  GU Assessment  Genitourinary (WDL) X  Genitourinary Symptoms (HD)  Psychosocial  Psychosocial (WDL) X  Patient Behaviors Not interactive (pt sedated, on vent)

## 2017-05-05 NOTE — Progress Notes (Signed)
Pulmonary/critical care  Procedure note Dialysis Catheter Placement  Appreciate nephrology's assistance. Patient is to go on hemodialysis. Discussed with family who is agreeable to try hemodialysis.  Performed on ultrasound guidance Left internal jugular approach attempted first. Complete contact barrier precautions utilized Topical Hibiclens over Site Patient given additional doses of Versed and fentanyl for sedation and pain Under ultrasound guidance the left internal jugular vein was cannulated. The vein was very collapsible and was subsequently repositioned with the patient in Trendelenburg A guidewire was passed and the needle was removed. Trying to dilate with sequential dilators met with significant amount resistance so procedure was aborted Converted over to right femoral vein approach Complete contact barrier precaution again utilized Topical Hibiclens utilized Under ultrasound guidance the right femoral vein was cannulated without difficulty A guidewire was placed and the needle removed Sequential dilation and a double-lumen dialysis catheter was placed without difficulty The guidewire was removed intact Both ports flushed and withdrew easily Sutured into place We will obtain chest x-ray to evaluate left hemithorax  Hermelinda Dellen, DO

## 2017-05-05 NOTE — Progress Notes (Signed)
Pre HD assessment   05/05/17 1640  Vital Signs  Temp 100.1 F (37.8 C)  Temp Source Axillary  Pulse Rate (!) 121  Pulse Rate Source Monitor  Resp (!) 21  BP (!) 100/56  BP Location Right Arm  BP Method Automatic  Patient Position (if appropriate) Lying  Oxygen Therapy  SpO2 100 %  O2 Device Ventilator  Pulse Oximetry Type Continuous  End Tidal CO2 (EtCO2) 17  Dialysis Weight  Weight 79 kg (174 lb 2.6 oz)  Type of Weight Pre-Dialysis  Time-Out for Hemodialysis  What Procedure? HD  Pt Identifiers(min of two) First/Last Name;MRN/Account#  Correct Site? Yes  Correct Side? Yes  Correct Procedure? Yes  Consents Verified? Yes  Rad Studies Available? N/A  Safety Precautions Reviewed? Yes  Engineer, civil (consulting) Number (6A)  Station Number (bedside, ICU 14)  UF/Alarm Test Passed  Conductivity: Meter 13.8  Conductivity: Machine  14.1  pH 7.4  Reverse Osmosis (WRO [phone])  Normal Saline Lot Number [id number]  Dialyzer Lot Number [id number]  Disposable Set Lot Number [id number]  Machine Temperature 98.6 F (37 C)  Musician and Audible Yes  Blood Lines Intact and Secured Yes  Pre Treatment Patient Checks  Vascular access used during treatment Catheter  Hepatitis B Surface Antigen Results (unk)  Hepatitis B Surface Antibody (unk)  Date Hepatitis B Surface Antibody Drawn 05/05/17  Hemodialysis Consent Verified Yes  Hemodialysis Standing Orders Initiated Yes  ECG (Telemetry) Monitor On Yes  Prime Ordered Normal Saline  Length of  DialysisTreatment -hour(s) 2 Hour(s)  Dialyzer Elisio 17H NR  Dialysate 2K, 2.5 Ca  Dialysis Anticoagulant None  Dialysate Flow Ordered 300  Blood Flow Rate Ordered 200 mL/min  Ultrafiltration Goal 0 Liters  Pre Treatment Labs Hepatitis B Surface Antigen;CBC;Renal panel (PTH, HBSAB)  Dialysis Blood Pressure Support Ordered Normal Saline  Education / Care Plan  Dialysis Education Provided No (Comment) (pt sedated, on vent, unable to answer  questions)

## 2017-05-05 NOTE — Progress Notes (Signed)
HD tx start   05/05/17 1710  Vital Signs  Pulse Rate (!) 120  Pulse Rate Source Monitor  Resp 20  BP (!) 109/57  BP Location Right Arm  BP Method Automatic  Patient Position (if appropriate) Lying  Oxygen Therapy  SpO2 100 %  O2 Device Ventilator  Pulse Oximetry Type Continuous  End Tidal CO2 (EtCO2) 25  During Hemodialysis Assessment  Blood Flow Rate (mL/min) 200 mL/min  Arterial Pressure (mmHg) -40 mmHg  Venous Pressure (mmHg) 40 mmHg  Transmembrane Pressure (mmHg) 40 mmHg  Ultrafiltration Rate (mL/min) 230 mL/min  Dialysate Flow Rate (mL/min) 300 ml/min  Conductivity: Machine  13.6  HD Safety Checks Performed Yes  Dialysis Fluid Bolus Normal Saline  Bolus Amount (mL) 250 mL  Intra-Hemodialysis Comments Tx initiated

## 2017-05-05 NOTE — Progress Notes (Signed)
Pre HD assessment   05/05/17 1642  Neurological  Level of Consciousness Responds to Pain  Orientation Level Intubated/Tracheostomy - Unable to assess  Respiratory  Respiratory Pattern Regular;Unlabored  Chest Assessment Chest expansion symmetrical  Cardiac  Cardiac Rhythm ST  Vascular  R Radial Pulse +2  L Radial Pulse +2  Integumentary  Integumentary (WDL) X  Skin Color Appropriate for ethnicity  Musculoskeletal  Musculoskeletal (WDL) X  Generalized Weakness Yes  Assistive Device None  GU Assessment  Genitourinary (WDL) X  Genitourinary Symptoms (HD)  Psychosocial  Psychosocial (WDL) X  Patient Behaviors Not interactive (pt vented/sedated)

## 2017-05-05 NOTE — Progress Notes (Signed)
   05/05/17 1905  Vital Signs  Temp 98.2 F (36.8 C)  Temp Source Axillary  Pulse Rate (!) 102  Pulse Rate Source Monitor  Resp 18  BP 115/62  BP Location Right Arm  BP Method Automatic  Patient Position (if appropriate) Lying  Oxygen Therapy  SpO2 100 %  O2 Device Ventilator  Pulse Oximetry Type Continuous  End Tidal CO2 (EtCO2) 33  Dialysis Weight  Weight 79.7 kg (175 lb 11.3 oz)  Type of Weight Post-Dialysis  Post-Hemodialysis Assessment  Rinseback Volume (mL) 250 mL  KECN 20.5 V  Dialyzer Clearance Lightly streaked  Duration of HD Treatment -hour(s) 1.8 hour(s)  Hemodialysis Intake (mL) 600 mL  UF Total -Machine (mL) 74 mL  Net UF (mL) -526 mL  Tolerated HD Treatment No (Comment) (Tx terminated early R/T decreased BP, pt on NEO drip)  Education / Care Plan  Dialysis Education Provided No (Comment) (pt unable to answer questions, on vent, sedated)

## 2017-05-05 NOTE — Progress Notes (Signed)
Post HD assessment. Pt did not tolerate tx well. Tx terminated early r/t decreased BP. Pt at max dose for NEO drip. MD made aware. Net UF 526

## 2017-05-05 NOTE — Progress Notes (Addendum)
Inpatient Diabetes Program Recommendations  AACE/ADA: New Consensus Statement on Inpatient Glycemic Control (2015)  Target Ranges:  Prepandial:   less than 140 mg/dL      Peak postprandial:   less than 180 mg/dL (1-2 hours)      Critically ill patients:  140 - 180 mg/dL   Lab Results  Component Value Date   GLUCAP 241 (H) 05/05/2017   HGBA1C 8.7 (H) 12/28/2015    Review of Glycemic Control Diabetes history: DM2 Outpatient Diabetes medications: Lantus 30 units daily Current orders for Inpatient glycemic control:  Novolog 0-15 units Q4H; Solumedrol 40 mg Q12H; Vital @ 40 ml/hr  Inpatient Diabetes Program Recommendations:  Insulin - Basal: If steroids are continued, please consider ordering Lantus to 30 units Q24H (starting now). Insulin - Tube Feeding Coverage: Please consider ordering Novolog 4 units Q4H for tube feeding coverage. If tube feeding is stopped or held then Novolog tube feeding coverage would be as well.   NOTE: In reviewing chart, note patient was started on IV insulin yesterday around 11:30am and was transitioned off IV insulin last night. Received Levemir 5 units at 23:36 pm and glucose has ranged from 225-241 mg/dl since then.  Steroids are still ordered which is contributing to hyperglycemia. Noted ICU Glycemic control order set Phase 3 was discontinued this am and Novolog moderate correction scale was ordered. Patient will require more basal insulin than what was given last night and tube feedings need to be covered with insulin.  Thanks, Barnie Alderman, RN, MSN, CDE Diabetes Coordinator Inpatient Diabetes Program [phone] (Team Pager from 8am to 5pm)

## 2017-05-06 ENCOUNTER — Inpatient Hospital Stay: Payer: Medicare Other

## 2017-05-06 DIAGNOSIS — J9601 Acute respiratory failure with hypoxia: Secondary | ICD-10-CM

## 2017-05-06 DIAGNOSIS — I639 Cerebral infarction, unspecified: Secondary | ICD-10-CM

## 2017-05-06 LAB — BASIC METABOLIC PANEL
Anion gap: 13 (ref 5–15)
BUN: 103 mg/dL — AB (ref 6–20)
CO2: 24 mmol/L (ref 22–32)
Calcium: 7.5 mg/dL — ABNORMAL LOW (ref 8.9–10.3)
Chloride: 106 mmol/L (ref 101–111)
Creatinine, Ser: 3.46 mg/dL — ABNORMAL HIGH (ref 0.44–1.00)
GFR calc Af Amer: 15 mL/min — ABNORMAL LOW (ref >60)
GFR, EST NON AFRICAN AMERICAN: 13 mL/min — AB (ref >60)
GLUCOSE: 205 mg/dL — AB (ref 65–99)
POTASSIUM: 4.2 mmol/L (ref 3.5–5.1)
SODIUM: 143 mmol/L (ref 135–145)

## 2017-05-06 LAB — CBC WITH DIFFERENTIAL/PLATELET
Basophils Absolute: 0 10*3/uL (ref 0–0.1)
Basophils Relative: 0 %
EOS ABS: 0 10*3/uL (ref 0–0.7)
Eosinophils Relative: 0 %
HCT: 26.4 % — ABNORMAL LOW (ref 35.0–47.0)
Hemoglobin: 8.1 g/dL — ABNORMAL LOW (ref 12.0–16.0)
LYMPHS ABS: 1 10*3/uL (ref 1.0–3.6)
Lymphocytes Relative: 4 %
MCH: 30.2 pg (ref 26.0–34.0)
MCHC: 30.7 g/dL — AB (ref 32.0–36.0)
MCV: 98.4 fL (ref 80.0–100.0)
MONO ABS: 0.5 10*3/uL (ref 0.2–0.9)
Monocytes Relative: 2 %
NEUTROS ABS: 23.8 10*3/uL — AB (ref 1.4–6.5)
NRBC: 5 /100{WBCs} — AB
Neutrophils Relative %: 94 %
PLATELETS: 68 10*3/uL — AB (ref 150–440)
RBC: 2.68 MIL/uL — AB (ref 3.80–5.20)
RDW: 17.5 % — AB (ref 11.5–14.5)
WBC: 25.3 10*3/uL — AB (ref 3.6–11.0)

## 2017-05-06 LAB — PATHOLOGIST SMEAR REVIEW

## 2017-05-06 LAB — GLUCOSE, CAPILLARY
GLUCOSE-CAPILLARY: 181 mg/dL — AB (ref 65–99)
GLUCOSE-CAPILLARY: 186 mg/dL — AB (ref 65–99)
GLUCOSE-CAPILLARY: 198 mg/dL — AB (ref 65–99)
GLUCOSE-CAPILLARY: 239 mg/dL — AB (ref 65–99)
Glucose-Capillary: 180 mg/dL — ABNORMAL HIGH (ref 65–99)
Glucose-Capillary: 188 mg/dL — ABNORMAL HIGH (ref 65–99)
Glucose-Capillary: 200 mg/dL — ABNORMAL HIGH (ref 65–99)

## 2017-05-06 LAB — HEPATITIS C ANTIBODY

## 2017-05-06 LAB — PARATHYROID HORMONE, INTACT (NO CA): PTH: 173 pg/mL — ABNORMAL HIGH (ref 15–65)

## 2017-05-06 LAB — HEPATITIS B CORE ANTIBODY, TOTAL: HEP B C TOTAL AB: NEGATIVE

## 2017-05-06 LAB — HEPATITIS B SURFACE ANTIBODY,QUALITATIVE: Hep B S Ab: NONREACTIVE

## 2017-05-06 LAB — HEPATITIS B SURFACE ANTIGEN: Hepatitis B Surface Ag: NEGATIVE

## 2017-05-06 NOTE — Care Management (Signed)
Both LTAC facilities updated on patient status of no extraordinary measure.

## 2017-05-06 NOTE — Consult Note (Signed)
Reason for Consult:Stroke Referring Physician: [place]  CC: Stroke  HPI: Samantha Mcmillan is an 62 y.o. female admitted on 2/26 after a fall with a hip fracture.  Patient has had multiple complications during this hospitalization leading to her being intubated.  Son in the room today and reports that the patient has not been conversant for over a week.  A couple of days ago was the last time that she possibly followed some simple commands like squeezing someone's hands.  Today ws noted to have dilated pupils and head CT was ordered.    Past Medical History:  Diagnosis Date  . Acute on chronic diastolic CHF (congestive heart failure) ([place]) 05/16/2014  . Anemia   . Anxiety   . Arthritis    "some in my feet" (05/04/2014)  . Asthma   . Chronic bronchitis ([place])    "get it pretty much q yr"   . Chronic lower back pain   . Chronic respiratory failure ([place])   . Cirrhosis of liver ([place])   . Cirrhosis of liver ([place])   . Colon polyp   . COPD (chronic obstructive pulmonary disease) ([place])   . Depression   . Family history of adverse reaction to anesthesia    "my sister doesn't wake up good when she's put deep to sleep"  . GERD (gastroesophageal reflux disease)   . Headache    "more than a couple times/wk" (05/04/2014)  . Heart murmur   . High cholesterol   . History of blood transfusion    "related to my anemia"  . Hypertension   . IBS (irritable bowel syndrome)   . IDA (iron deficiency anemia) 08/14/2014  . Iron deficiency anemia   . OCD (obsessive compulsive disorder)   . On home oxygen therapy    "2L; 24/7" (05/04/2014)  . Panic attack   . Personal history of tobacco use, presenting hazards to health 06/21/2015  . Pneumonia "several times"  . PTSD (post-traumatic stress disorder)   . Sleep apnea    "can't tolerate CPAP" (05/04/2014)  . Type II diabetes mellitus ([place])     Past Surgical History:  Procedure Laterality Date  . BACK SURGERY    . CATARACT EXTRACTION W/ INTRAOCULAR LENS   IMPLANT, BILATERAL Bilateral 2005  . CESAREAN SECTION  1979  . COLONOSCOPY  2014  . COLONOSCOPY WITH PROPOFOL N/A 12/08/2016   Procedure: COLONOSCOPY WITH PROPOFOL;  Surgeon: Lucilla Lame, MD;  Location: [facility] ENDOSCOPY;  Service: Endoscopy;  Laterality: N/A;  . DILATION AND CURETTAGE OF UTERUS    . ESOPHAGOGASTRODUODENOSCOPY (EGD) WITH PROPOFOL N/A 12/08/2016   Procedure: ESOPHAGOGASTRODUODENOSCOPY (EGD) WITH PROPOFOL;  Surgeon: Lucilla Lame, MD;  Location: [facility] ENDOSCOPY;  Service: Endoscopy;  Laterality: N/A;  . HIP ARTHROPLASTY Left 04/05/2017   Procedure: ARTHROPLASTY BIPOLAR HIP (HEMIARTHROPLASTY);  Surgeon: Thornton Park, MD;  Location: [facility] ORS;  Service: Orthopedics;  Laterality: Left;  . INCISION AND DRAINAGE OF WOUND Right 2009   "leg mauled  by dog"  . PERIPHERAL VASCULAR CATHETERIZATION N/A 08/16/2014   Procedure: PICC Line Insertion;  Surgeon: Algernon Huxley, MD;  Location: [facility] CV LAB;  Service: Cardiovascular;  Laterality: N/A;  . POSTERIOR FUSION CERVICAL SPINE  2015   "rebuilt 3 of my neck vertebrae"  . TUBAL LIGATION  1979  . UPPER GASTROINTESTINAL ENDOSCOPY      Family History  Problem Relation Age of Onset  . Cancer Father        pancreatic  . Cancer Brother   . Breast  cancer Maternal Aunt     Social History:  reports that she has been smoking cigarettes.  She has a 24.00 pack-year smoking history. she has never used smokeless tobacco. She reports that she does not drink alcohol or use drugs.  Allergies  Allergen Reactions  . Iodine Shortness Of Breath  . Latex Anaphylaxis  . Other Rash, Other (See Comments), Shortness Of Breath, Anaphylaxis and Swelling    Pt states that she is allergic to Darvocet.   . Oxycodone Shortness Of Breath, Rash and Swelling    Other reaction(s): Other (See Comments) wheezing  . Oxycodone-Acetaminophen Other (See Comments), Rash, Swelling and Shortness Of Breath    wheezing wheezing  . Shellfish-Derived Products  Anaphylaxis, Rash, Shortness Of Breath and Swelling    Throat closes.  . Amitriptyline Other (See Comments)    Other reaction(s): Other (See Comments) Mental changes Other reaction(s): Other (See Comments) Altered mental status, agitation Reaction:  Mental changes   . Fish-Derived Products Other (See Comments) and Swelling    Other reaction(s): Other (See Comments) "respiratory distress and wheezing" "respiratory distress and wheezing"  . Tape Other (See Comments)    Other reaction(s): Other (See Comments) Pulls skin off Pt states that it pulls her skin off.  Pt states that it pulls her skin off.   . Wellbutrin [Bupropion] Other (See Comments)    Other reaction(s): Other (See Comments) Mental changes Reaction:  Mental changes   . Augmentin [Amoxicillin-Pot Clavulanate] Rash and Other (See Comments)    Has patient had a PCN reaction causing immediate rash, facial/tongue/throat swelling, SOB or lightheadedness with hypotension: No Has patient had a PCN reaction causing severe rash involving mucus membranes or skin necrosis: No Has patient had a PCN reaction that required hospitalization No Has patient had a PCN reaction occurring within the last 10 years: No If all of the above answers are "NO", then may proceed with Cephalosporin use.  . Codeine Rash  . Cortisone Rash  . Diphenhydramine Rash  . Hydrocodone-Acetaminophen Rash    dizzy  . Vicodin [Hydrocodone-Acetaminophen] Rash and Other (See Comments)    Reaction:  Dizziness      Medications:  I have reviewed the patient's current medications. Prior to Admission:  Medications Prior to Admission  Medication Sig Dispense Refill Last Dose  . albuterol (PROAIR HFA) 108 (90 Base) MCG/ACT inhaler ProAir HFA 90 mcg/actuation aerosol inhaler   Not Taking  . allopurinol (ZYLOPRIM) 100 MG tablet Take 100 mg by mouth daily.    04/07/2017 at Unknown time  . ARIPiprazole (ABILIFY) 5 MG tablet Take 5 mg by mouth at bedtime.    04/11/2017 at  Unknown time  . atorvastatin (LIPITOR) 20 MG tablet Take 20 mg by mouth daily.   04/15/2017 at Unknown time  . budesonide-formoterol (SYMBICORT) 80-4.5 MCG/ACT inhaler Inhale 2 puffs into the lungs 2 (two) times daily.    03/31/2017 at Unknown time  . bumetanide (BUMEX) 1 MG tablet Take 2 mg by mouth 2 (two) times daily.    04/17/2017 at Unknown time  . Calcium Carbonate-Vitamin D (CALTRATE 600+D PO) Take 1 tablet by mouth daily.   03/31/2017 at Unknown time  . ferrous fumarate-iron polysaccharide complex (TANDEM) 162-115.2 MG CAPS capsule Take 1 capsule by mouth 2 (two) times daily after a meal. 60 capsule 11 04/06/2017 at Unknown time  . HYDROmorphone (DILAUDID) 2 MG tablet Take 1 tablet (2 mg total) by mouth every 6 (six) hours as needed for severe pain. (Patient taking differently: Take 2  mg by mouth every 8 (eight) hours as needed for severe pain. ) 30 tablet 0 04/18/2017 at Unknown time  . insulin glargine (LANTUS) 100 UNIT/ML injection Inject 30 Units into the skin every morning.    03/31/2017 at Unknown time  . ipratropium-albuterol (DUONEB) 0.5-2.5 (3) MG/3ML SOLN Take 3 mLs by nebulization every 4 (four) hours as needed.   prn  . lamoTRIgine (LAMICTAL) 150 MG tablet Take 150 mg by mouth at bedtime.    04/19/2017 at Unknown time  . levothyroxine (SYNTHROID, LEVOTHROID) 88 MCG tablet Take 88 mcg by mouth daily before breakfast.    04/22/2017 at Unknown time  . linaclotide (LINZESS) 290 MCG CAPS capsule Take 1 capsule (290 mcg total) daily before breakfast by mouth. 30 capsule 6 04/11/2017 at Unknown time  . lisinopril (PRINIVIL,ZESTRIL) 20 MG tablet Take 20 mg by mouth daily.   04/04/2017 at Unknown time  . magnesium oxide (MAG-OX) 400 MG tablet Take 1 tablet by mouth 2 (two) times daily.    04/04/2017 at Unknown time  . metolazone (ZAROXOLYN) 5 MG tablet Take 5 mg by mouth daily.   04/19/2017 at Unknown time  . metoprolol (LOPRESSOR) 50 MG tablet Take 50 mg by mouth 2 (two) times daily.   03/26/2017 at  Unknown time  . Multiple Vitamin (MULTIVITAMIN WITH MINERALS) TABS tablet Take 1 tablet by mouth daily.   04/18/2017 at Unknown time  . pantoprazole (PROTONIX) 40 MG tablet Take 40 mg by mouth daily.   04/08/2017 at Unknown time  . sertraline (ZOLOFT) 100 MG tablet Take 200 mg by mouth daily.    04/17/2017 at Unknown time  . spironolactone (ALDACTONE) 25 MG tablet Take 25 mg by mouth daily.   04/14/2017 at Unknown time  . albuterol (PROVENTIL) (2.5 MG/3ML) 0.083% nebulizer solution Take 2.5 mg by nebulization every 6 (six) hours as needed for wheezing or shortness of breath.   Taking  . spironolactone (ALDACTONE) 50 MG tablet Take 1 tablet (50 mg total) by mouth daily. 30 tablet 11 Taking   Scheduled: . budesonide (PULMICORT) nebulizer solution  0.25 mg Nebulization Q12H  . chlorhexidine gluconate (MEDLINE KIT)  15 mL Mouth Rinse BID  . feeding supplement (PRO-STAT SUGAR FREE 64)  30 mL Per Tube BID  . free water  200 mL Per Tube Q6H  . insulin aspart  0-15 Units Subcutaneous Q4H  . levalbuterol  1.25 mg Nebulization Q6H  . mouth rinse  15 mL Mouth Rinse 10 times per day  . methylPREDNISolone (SOLU-MEDROL) injection  40 mg Intravenous Q12H  . multivitamin  15 mL Per Tube Daily  . nicotine  14 mg Transdermal Daily  . pantoprazole sodium  40 mg Per Tube Daily  . sodium chloride flush  10-40 mL Intracatheter Q12H  . vitamin C  500 mg Oral BID    ROS: Unable to obtain due to mental status  Physical Examination: Blood pressure (!) 99/51, pulse (!) 118, temperature 98.6 F (37 C), temperature source Axillary, resp. rate (!) 25, height '5\' 4"'$  (1.626 m), weight 78.3 kg (172 lb 9.9 oz), SpO2 100 %.  HEENT-  Normocephalic, no lesions, without obvious abnormality.  Normal external eye and conjunctiva.  Normal TM's bilaterally.  Normal auditory canals and external ears. Normal external nose, mucus membranes and septum.  Normal pharynx. Cardiovascular- S1, S2 normal, pulses palpable throughout    Lungs- chest clear, no wheezing, rales, normal symmetric air entry Abdomen- soft, non-tender; bowel sounds normal; no masses,  no organomegaly Extremities- no  edema Lymph-no adenopathy palpable Musculoskeletal-no joint tenderness, deformity or swelling Skin-warm and dry, no hyperpigmentation, vitiligo, or suspicious lesions  Neurological Examination   Mental Status: Patient does not respond to verbal stimuli.  Does not respond to deep sternal rub.  Does not follow commands.  No verbalizations noted.  Cranial Nerves: II: patient does not respond confrontation bilaterally, pupils right 4 mm, left 3 mm,and reactive bilaterally III,IV,VI: doll's response present bilaterally.  V,VII: corneal reflex reduced bilaterally  VIII: patient does not respond to verbal stimuli IX,X: gag reflex reduced, XI: trapezius strength unable to test bilaterally XII: tongue strength unable to test Motor: Extremities flaccid throughout.  No spontaneous movement noted.  No purposeful movements noted. Sensory: Does not respond to noxious stimuli in any extremity. Deep Tendon Reflexes:  1+ in the upper extremities and absent in the lower extremities Plantars: Mute bilaterally Cerebellar: Unable to perform        Laboratory Studies:   Basic Metabolic Panel: Recent Labs  Lab 05/01/17 0458  05/02/17 1700 05/03/17 0558 05/03/17 1832 05/04/17 0432 05/05/17 0510 05/05/17 1541 05/06/17 0417  NA 149*   < >  --  156*  --  154* 149* 149* 143  K 3.8   < >  --  3.5  --  3.4* 3.9 4.2 4.2  CL 119*   < >  --  127*  --  127* 118* 115* 106  CO2 20*   < >  --  17*  --  19* '22 24 24  '$ GLUCOSE 180*   < >  --  233*  --  381* 255* 295* 205*  BUN 94*   < >  --  100*  --  107* 119* 136* 103*  CREATININE 2.22*   < >  --  2.54*  --  3.09* 3.54* 4.02* 3.46*  CALCIUM 8.0*   < >  --  8.3*  --  8.1* 8.3* 7.9* 7.5*  MG 2.4  --  2.5* 2.5* 2.6* 2.6*  --   --   --   PHOS 4.6  --  4.5 4.2 4.5 4.9*  4.9*  --  5.5*  --    < >  = values in this interval not displayed.    Liver Function Tests: Recent Labs  Lab 05/04/17 0432 05/05/17 1541  ALBUMIN 1.8* 1.9*   No results for input(s): LIPASE, AMYLASE in the last 168 hours. Recent Labs  Lab 05/02/17 0731 05/02/17 0842 05/05/17 0824  AMMONIA 48* 31 47*    CBC: Recent Labs  Lab 05/01/17 0458 05/02/17 0731 05/04/17 0432 05/05/17 0510 05/05/17 1541 05/05/17 2254 05/06/17 0417  WBC 19.4* 9.4  --  19.4* 24.3*  --  25.3*  NEUTROABS  --   --   --   --   --   --  23.8*  HGB 7.7* 7.5*  --  8.4* 7.9* 8.3* 8.1*  HCT 25.3* 24.8*  --  27.5* 26.8* 27.7* 26.4*  MCV 100.6* 100.5*  --  100.1* 101.8*  --  98.4  PLT 117* 66* 45* 66* 88*  --  68*    Cardiac Enzymes: No results for input(s): CKTOTAL, CKMB, CKMBINDEX, TROPONINI in the last 168 hours.  BNP: Invalid input(s): POCBNP  CBG: Recent Labs  Lab 05/05/17 1928 05/06/17 0025 05/06/17 0400 05/06/17 0733 05/06/17 1242  GLUCAP 198* 181* 198* 180* 24*    Microbiology: Results for orders placed or performed during the hospital encounter of 03/30/2017  Surgical PCR screen     Status:  None   Collection Time: 04/19/2017 12:09 AM  Result Value Ref Range Status   MRSA, PCR NEGATIVE NEGATIVE Final   Staphylococcus aureus NEGATIVE NEGATIVE Final    Comment: (NOTE) The Xpert SA Assay (FDA approved for NASAL specimens in patients 36 years of age and older), is one component of a comprehensive surveillance program. It is not intended to diagnose infection nor to guide or monitor treatment. Performed at [facility], [address] [place], [place] [address]   Culture, blood (routine x 2) Call MD if unable to obtain prior to antibiotics being given     Status: None   Collection Time: 04/24/17 10:40 AM  Result Value Ref Range Status   Specimen Description BLOOD BLOOD RIGHT ARM  Final   Special Requests   Final    BOTTLES DRAWN AEROBIC AND ANAEROBIC Blood Culture adequate volume   Culture   Final     NO GROWTH 5 DAYS Performed at [facility], [address] [place], [place] [address]    Report Status 04/29/2017 FINAL  Final  Culture, blood (routine x 2) Call MD if unable to obtain prior to antibiotics being given     Status: None   Collection Time: 04/24/17 10:55 AM  Result Value Ref Range Status   Specimen Description BLOOD BLOOD LEFT HAND  Final   Special Requests   Final    BOTTLES DRAWN AEROBIC AND ANAEROBIC Blood Culture adequate volume   Culture   Final    NO GROWTH 5 DAYS Performed at [facility], [address] [place], [place] [address]    Report Status 04/29/2017 FINAL  Final    Coagulation Studies: No results for input(s): LABPROT, INR in the last 72 hours.  Urinalysis: No results for input(s): COLORURINE, LABSPEC, PHURINE, GLUCOSEU, HGBUR, BILIRUBINUR, KETONESUR, PROTEINUR, UROBILINOGEN, NITRITE, LEUKOCYTESUR in the last 168 hours.  Invalid input(s): APPERANCEUR  Lipid Panel:  No results found for: CHOL, TRIG, HDL, CHOLHDL, VLDL, LDLCALC  HgbA1C:  Lab Results  Component Value Date   HGBA1C 8.7 (H) 12/28/2015    Urine Drug Screen:  No results found for: LABOPIA, COCAINSCRNUR, LABBENZ, AMPHETMU, THCU, LABBARB  Alcohol Level: No results for input(s): ETH in the last 168 hours.  Other results: EKG: sinus tachycardia at 134 bpm.  Imaging: Ct Head Wo Contrast  Result Date: 05/06/2017 CLINICAL DATA:  Respiratory failure.  Suspected cerebral hemorrhage. EXAM: CT HEAD WITHOUT CONTRAST TECHNIQUE: Contiguous axial images were obtained from the base of the skull through the vertex without intravenous contrast. COMPARISON:  April 20, 2017 FINDINGS: Brain: There is low-density with loss of gray-white differentiation in the left occipital lobe. There is no hydrocephalus or midline shift. No acute hemorrhage is identified. There is chronic small vessel ischemic change in the bilateral periventricular white matter more prominently in the left  frontal white matter. Vascular: No hyperdense vessel or unexpected calcification. Skull: Normal. Negative for fracture or focal lesion. Sinuses/Orbits: No acute finding. Other: None. 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 No acute intracranial hemorrhage is noted. Electronically Signed   By: Abelardo Diesel M.D.   On: 05/06/2017 08:48   Dg Chest Port 1 View  Result Date: 05/05/2017 CLINICAL DATA:  Unsuccessful central line placement. Evaluate for pneumothorax. EXAM: PORTABLE CHEST 1 VIEW COMPARISON:  Chest x-ray from same day at 9:31 a.m. FINDINGS: Unchanged endotracheal tube, enteric tube, and left upper extremity PICC line. The heart size and mediastinal contours are within normal  limits. Unchanged interstitial edema. No focal consolidation, pleural effusion, or pneumothorax. No acute osseous abnormality. IMPRESSION: 1. No pneumothorax. 2. Unchanged interstitial pulmonary edema. Electronically Signed   By: Titus Dubin M.D.   On: 05/05/2017 13:02   Dg Chest Port 1 View  Result Date: 05/05/2017 CLINICAL DATA:  Status post intubation EXAM: PORTABLE CHEST 1 VIEW COMPARISON:  05/03/2017 FINDINGS: Endotracheal tube and nasogastric catheter are again noted in satisfactory position. Left-sided PICC line is stable in position. The cardiac shadow is unremarkable. The lungs demonstrate some diffuse interstitial changes stable from the prior exam. No new focal abnormality is seen. IMPRESSION: Stable interstitial changes when compared with the prior study. Tubes and lines as described above. Electronically Signed   By: Inez Catalina M.D.   On: 05/05/2017 09:59     Assessment/Plan: 62 year old female presenting with hip fracture, now intubated due to multiple complications.  Recent change in neurological status prompted a head CT.  Head CT reviewed and shows an area on hypodensity in the left occipital lobe likely representing an acute infarct.  Patient's  current neurological examination suggests that there may be more of an ischemic burden that we can not pick up on CT imaging.  Discussed condition and prognosis with son.  He does not want to proceed with more aggressive care therefore we will not embark upon a stroke work up at this time.  Recommendations: 1. ASA daily  Alexis Goodell, MD Neurology [phone] 05/06/2017, 2:24 PM

## 2017-05-06 NOTE — Progress Notes (Signed)
Chaplain encountered the patient during rounding and offered silent prayer.

## 2017-05-06 NOTE — Progress Notes (Signed)
[facility]  ROUNDING NOTE   Subjective:   Son at bedside.   Vasopressin  Unable to tolerate hemodialysis intermittent yesterday.   CT with acute left occipital stroke  Objective:  Vital signs in last 24 hours:  Temp:  [98.2 F (36.8 C)-100.1 F (37.8 C)] 98.5 F (36.9 C) (03/14 0700) Pulse Rate:  [101-147] 124 (03/14 0800) Resp:  [18-26] 21 (03/14 0800) BP: (81-128)/(50-70) 107/60 (03/14 0800) SpO2:  [100 %] 100 % (03/14 0800) FiO2 (%):  [35 %] 35 % (03/14 0408) Weight:  [78.3 kg (172 lb 9.9 oz)-79.7 kg (175 lb 11.3 oz)] 78.3 kg (172 lb 9.9 oz) (03/14 0349)  Weight change: 1.8 kg (3 lb 15.5 oz) Filed Weights   05/05/17 1640 05/05/17 1905 05/06/17 0349  Weight: 79 kg (174 lb 2.6 oz) 79.7 kg (175 lb 11.3 oz) 78.3 kg (172 lb 9.9 oz)    Intake/Output: I/O last 3 completed shifts: In: 5687.8 [I.V.:3683.8; NG/GT:2004] Out: 1039 [Urine:65; Stool:1500]   Intake/Output this shift:  No intake/output data recorded.  Physical Exam: General: Critically ill  Head: ETT OGT  Eyes: Right eye not reactive  Neck: Supple   Lungs:  PRVC FiO2 35%  Heart: Tachycardia,    Abdomen:  Soft, nontender  Extremities: ++ peripheral edema.  Neurologic: Intubated, sedated  Skin: No lesions  Access: Right femoral temp HD catheter. Dr. Jefferson Fuel 0/38    Basic Metabolic Panel: Recent Labs  Lab 05/01/17 0458  05/02/17 1700 05/03/17 0558 05/03/17 1832 05/04/17 0432 05/05/17 0510 05/05/17 1541 05/06/17 0417  NA 149*   < >  --  156*  --  154* 149* 149* 143  K 3.8   < >  --  3.5  --  3.4* 3.9 4.2 4.2  CL 119*   < >  --  127*  --  127* 118* 115* 106  CO2 20*   < >  --  17*  --  19* _0 GLUCOSE 180*   < >  --  233*  --  381* 255* 295* 205*  BUN 94*   < >  --  100*  --  107* 119* 136* 103*  CREATININE 2.22*   < >  --  2.54*  --  3.09* 3.54* 4.02* 3.46*  CALCIUM 8.0*   < >  --  8.3*  --  8.1* 8.3* 7.9* 7.5*  MG 2.4  --  2.5* 2.5* 2.6* 2.6*  --   --   --   PHOS 4.6  --   4.5 4.2 4.5 4.9*  4.9*  --  5.5*  --    < > = values in this interval not displayed.    Liver Function Tests: Recent Labs  Lab 05/04/17 0432 05/05/17 1541  ALBUMIN 1.8* 1.9*   No results for input(s): LIPASE, AMYLASE in the last 168 hours. Recent Labs  Lab 05/02/17 0731 05/02/17 0842 05/05/17 0824  AMMONIA 48* 31 47*    CBC: Recent Labs  Lab 05/01/17 0458 05/02/17 0731 05/04/17 0432 05/05/17 0510 05/05/17 1541 05/05/17 2254 05/06/17 0417  WBC 19.4* 9.4  --  19.4* 24.3*  --  25.3*  NEUTROABS  --   --   --   --   --   --  23.8*  HGB 7.7* 7.5*  --  8.4* 7.9* 8.3* 8.1*  HCT 25.3* 24.8*  --  27.5* 26.8* 27.7* 26.4*  MCV 100.6* 100.5*  --  100.1* 101.8*  --  98.4  PLT 117* 66*  45* 66* 88*  --  68*    Cardiac Enzymes: No results for input(s): CKTOTAL, CKMB, CKMBINDEX, TROPONINI in the last 168 hours.  BNP: Invalid input(s): POCBNP  CBG: Recent Labs  Lab 05/05/17 1625 05/05/17 1928 05/06/17 0025 05/06/17 0400 05/06/17 0733  GLUCAP 286* 198* 181* 198* 180*    Microbiology: Results for orders placed or performed during the hospital encounter of 03/30/2017  Surgical PCR screen     Status: None   Collection Time: 03/28/2017 12:09 AM  Result Value Ref Range Status   MRSA, PCR NEGATIVE NEGATIVE Final   Staphylococcus aureus NEGATIVE NEGATIVE Final    Comment: (NOTE) The Xpert SA Assay (FDA approved for NASAL specimens in patients 43 years of age and older), is one component of a comprehensive surveillance program. It is not intended to diagnose infection nor to guide or monitor treatment. Performed at [facility], [address] [place], [place] [address]   Culture, blood (routine x 2) Call MD if unable to obtain prior to antibiotics being given     Status: None   Collection Time: 04/24/17 10:40 AM  Result Value Ref Range Status   Specimen Description BLOOD BLOOD RIGHT ARM  Final   Special Requests   Final    BOTTLES DRAWN AEROBIC AND ANAEROBIC  Blood Culture adequate volume   Culture   Final    NO GROWTH 5 DAYS Performed at [facility], [address] [place], [place] [address]    Report Status 04/29/2017 FINAL  Final  Culture, blood (routine x 2) Call MD if unable to obtain prior to antibiotics being given     Status: None   Collection Time: 04/24/17 10:55 AM  Result Value Ref Range Status   Specimen Description BLOOD BLOOD LEFT HAND  Final   Special Requests   Final    BOTTLES DRAWN AEROBIC AND ANAEROBIC Blood Culture adequate volume   Culture   Final    NO GROWTH 5 DAYS Performed at [facility], [address] [place], [place] [address]    Report Status 04/29/2017 FINAL  Final    Coagulation Studies: No results for input(s): LABPROT, INR in the last 72 hours.  Urinalysis: No results for input(s): COLORURINE, LABSPEC, PHURINE, GLUCOSEU, HGBUR, BILIRUBINUR, KETONESUR, PROTEINUR, UROBILINOGEN, NITRITE, LEUKOCYTESUR in the last 72 hours.  Invalid input(s): APPERANCEUR    Imaging: Ct Head Wo Contrast  Result Date: 05/06/2017 CLINICAL DATA:  Respiratory failure.  Suspected cerebral hemorrhage. EXAM: CT HEAD WITHOUT CONTRAST TECHNIQUE: Contiguous axial images were obtained from the base of the skull through the vertex without intravenous contrast. COMPARISON:  April 20, 2017 FINDINGS: Brain: There is low-density with loss of gray-white differentiation in the left occipital lobe. There is no hydrocephalus or midline shift. No acute hemorrhage is identified. There is chronic small vessel ischemic change in the bilateral periventricular white matter more prominently in the left frontal white matter. Vascular: No hyperdense vessel or unexpected calcification. Skull: Normal. Negative for fracture or focal lesion. Sinuses/Orbits: No acute finding. Other: None. 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 No acute intracranial hemorrhage is noted.  Electronically Signed   By: Abelardo Diesel M.D.   On: 05/06/2017 08:48   Dg Chest Port 1 View  Result Date: 05/05/2017 CLINICAL DATA:  Unsuccessful central line placement. Evaluate for pneumothorax. EXAM: PORTABLE CHEST 1 VIEW COMPARISON:  Chest x-ray from same day at 9:31 a.m. FINDINGS: Unchanged endotracheal tube, enteric tube, and left upper  extremity PICC line. The heart size and mediastinal contours are within normal limits. Unchanged interstitial edema. No focal consolidation, pleural effusion, or pneumothorax. No acute osseous abnormality. IMPRESSION: 1. No pneumothorax. 2. Unchanged interstitial pulmonary edema. Electronically Signed   By: Titus Dubin M.D.   On: 05/05/2017 13:02   Dg Chest Port 1 View  Result Date: 05/05/2017 CLINICAL DATA:  Status post intubation EXAM: PORTABLE CHEST 1 VIEW COMPARISON:  05/03/2017 FINDINGS: Endotracheal tube and nasogastric catheter are again noted in satisfactory position. Left-sided PICC line is stable in position. The cardiac shadow is unremarkable. The lungs demonstrate some diffuse interstitial changes stable from the prior exam. No new focal abnormality is seen. IMPRESSION: Stable interstitial changes when compared with the prior study. Tubes and lines as described above. Electronically Signed   By: Inez Catalina M.D.   On: 05/05/2017 09:59     Medications:   . feeding supplement (VITAL HIGH PROTEIN) 1,000 mL (05/06/17 0600)  . fentaNYL infusion INTRAVENOUS Stopped (05/04/17 0801)  . phenylephrine (NEO-SYNEPHRINE) Adult infusion Stopped (05/06/17 0516)  . vasopressin (PITRESSIN) infusion - *FOR SHOCK* 0.03 Units/min (05/06/17 0600)   . budesonide (PULMICORT) nebulizer solution  0.25 mg Nebulization Q12H  . chlorhexidine gluconate (MEDLINE KIT)  15 mL Mouth Rinse BID  . feeding supplement (PRO-STAT SUGAR FREE 64)  30 mL Per Tube BID  . free water  200 mL Per Tube Q6H  . insulin aspart  0-15 Units Subcutaneous Q4H  . levalbuterol  1.25 mg  Nebulization Q6H  . mouth rinse  15 mL Mouth Rinse 10 times per day  . methylPREDNISolone (SOLU-MEDROL) injection  40 mg Intravenous Q12H  . multivitamin  15 mL Per Tube Daily  . nicotine  14 mg Transdermal Daily  . pantoprazole sodium  40 mg Per Tube Daily  . sodium chloride flush  10-40 mL Intracatheter Q12H  . vitamin C  500 mg Oral BID   acetaminophen **OR** acetaminophen, alum & mag hydroxide-simeth, bisacodyl, fentaNYL (SUBLIMAZE) injection, magnesium citrate, metoprolol tartrate, midazolam, morphine injection, ondansetron **OR** ondansetron (ZOFRAN) IV, polyethylene glycol, senna-docusate, sodium chloride flush  Assessment/ Plan:  Samantha Mcmillan is a 62 y.o. white female with anemia, hypothyroidism, hyponatremia, COPD, gout, hypertension, chronic edema, hyperlipidemia, diabetes mellitus type 2, GERD admitted to [facility] on 04/17/2017 for Closed fracture of left hip, initial encounter ([place]) [S72.002A] Fall, initial encounter [W19.XXXA]   Prolonged hospitalization with acute respiratory failure on 3/8 and intubated and sedated.   1. 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  Acute renal failure with ATN - Continue to renally dose medications.  - Hold lisinopril. Hold spironolactone and furosemide.  - Discussed dialysis with patient's family. Overall prognosis is poor. Did not tolerate intermittent hemodialysis.  Hesitant to try continuous dialysis.  - Family meeting today  2. Hypernatremia: free water deficit improved - Discontinue sodium bicarb gtt - Continue free water flushes    3. Acute exacerbation of systolic congestive heart failure and respiratory failure requiring mechanical ventilation Echo EF 30-35%.     LOS: 16 Kamrie Fanton 3/14/20199:30 AM

## 2017-05-06 NOTE — Plan of Care (Addendum)
Spoke with CCM, per CCM palliative will sign off. Please reconsult if needed.

## 2017-05-06 NOTE — Progress Notes (Signed)
Pharmacy ICU Medication Monitoring Consult:  Pharmacy consulted to assist in monitoring and replacing electrolytes in this 62 y.o. female admitted on 04/13/2017 with Hip Pain and Fall  Patient currently requiring mechanical ventilation. Patient to undergo trial of dialysis 3/13 after access obtained.   Labs:  Sodium (mmol/L)  Date Value  05/06/2017 143  05/31/2014 128 (A)  03/15/2014 136   Potassium (mmol/L)  Date Value  05/06/2017 4.2  03/15/2014 3.1 (L)   Magnesium (mg/dL)  Date Value  05/04/2017 2.6 (H)  03/15/2014 1.6 (L)   Phosphorus (mg/dL)  Date Value  05/05/2017 5.5 (H)   Calcium (mg/dL)  Date Value  05/06/2017 7.5 (L)   Calcium, Total (mg/dL)  Date Value  03/15/2014 7.7 (L)   Albumin (g/dL)  Date Value  05/05/2017 1.9 (L)  03/15/2014 2.1 (L)    Plan:  1. Electrolytes:  No replacement warranted. As patient is progressing to dialysis, will defer to nephrology for future replacement.   2. Glucose: Lantus 30 units Q24r, Novolog 4 units q4hr while on tube feeds, SSI.   3. Constipation: Will continue to monitor.   Pharmacy will continue to monitor and adjust per consult.   Wah Sabic L 05/06/2017 8:32 PM

## 2017-05-06 NOTE — Progress Notes (Signed)
   05/06/17 1040  Clinical Encounter Type  Visited With Patient not available  Visit Type Follow-up  Spiritual Encounters  Spiritual Needs Prayer   While rounding on unit, chaplain attempted to follow up with patient family.  Patient the only one in the room; chaplain offered silent prayers.

## 2017-05-06 NOTE — Progress Notes (Signed)
   05/06/17 1340  Clinical Encounter Type  Visited With Family  Visit Type Follow-up   Chaplain checked in to offer emotional support, met family that chaplain had not met before.  Family has no needs at present, chaplain encouraged them to have staff page chaplain if needs changed.

## 2017-05-06 NOTE — Progress Notes (Signed)
[place] [facility] [place] NAME: Samantha Mcmillan    MR#:  [id number]  DATE OF BIRTH:  04-19-1955  SUBJECTIVE:  CHIEF COMPLAINT:   Chief Complaint  Patient presents with  . Hip Pain  . Fall  Patient remains intubated noted to have stroke now   REVIEW OF SYSTEMS:    ROS not obtainable as the patient is intubated  DRUG ALLERGIES:   Allergies  Allergen Reactions  . Iodine Shortness Of Breath  . Latex Anaphylaxis  . Other Rash, Other (See Comments), Shortness Of Breath, Anaphylaxis and Swelling    Pt states that she is allergic to Darvocet.   . Oxycodone Shortness Of Breath, Rash and Swelling    Other reaction(s): Other (See Comments) wheezing  . Oxycodone-Acetaminophen Other (See Comments), Rash, Swelling and Shortness Of Breath    wheezing wheezing  . Shellfish-Derived Products Anaphylaxis, Rash, Shortness Of Breath and Swelling    Throat closes.  . Amitriptyline Other (See Comments)    Other reaction(s): Other (See Comments) Mental changes Other reaction(s): Other (See Comments) Altered mental status, agitation Reaction:  Mental changes   . Fish-Derived Products Other (See Comments) and Swelling    Other reaction(s): Other (See Comments) "respiratory distress and wheezing" "respiratory distress and wheezing"  . Tape Other (See Comments)    Other reaction(s): Other (See Comments) Pulls skin off Pt states that it pulls her skin off.  Pt states that it pulls her skin off.   . Wellbutrin [Bupropion] Other (See Comments)    Other reaction(s): Other (See Comments) Mental changes Reaction:  Mental changes   . Augmentin [Amoxicillin-Pot Clavulanate] Rash and Other (See Comments)    Has patient had a PCN reaction causing immediate rash, facial/tongue/throat swelling, SOB or lightheadedness with hypotension: No Has patient had a PCN reaction causing severe rash involving mucus membranes or skin necrosis: No Has patient had a PCN  reaction that required hospitalization No Has patient had a PCN reaction occurring within the last 10 years: No If all of the above answers are "NO", then may proceed with Cephalosporin use.  . Codeine Rash  . Cortisone Rash  . Diphenhydramine Rash  . Hydrocodone-Acetaminophen Rash    dizzy  . Vicodin [Hydrocodone-Acetaminophen] Rash and Other (See Comments)    Reaction:  Dizziness      VITALS:  Blood pressure (!) 93/51, pulse (!) 117, temperature 98.6 F (37 C), temperature source Axillary, resp. rate (!) 23, height 5\' 4"  (1.626 m), weight 172 lb 9.9 oz (78.3 kg), SpO2 100 %.  PHYSICAL EXAMINATION:  GENERAL:  62 y.o.-year-old patient lying in the bed intubated and sedated EYES: Pupils equal, round, reactive to light and accommodation. No scleral icterus.  HEENT: Head atraumatic, normocephalic. OT intact NECK:  Supple, no jugular venous distention. No thyroid enlargement, no tenderness.  LUNGS: Mod breath sounds bilaterally, no wheezing, rales,rhonchi or crepitation. No use of accessory muscles of respiration.  CARDIOVASCULAR: S1, S2 normal. No murmurs, rubs, or gallops.  ABDOMEN: Soft, nontender, nondistended. Bowel sounds present.  EXTREMITIES: No pedal edema, cyanosis, or clubbing.  NEUROLOGIC: Intubated PSYCHIATRIC: The patient is sedated  sKIN: No obvious rash, lesion, or ulcer.   Physical Exam LABORATORY PANEL:   CBC Recent Labs  Lab 05/06/17 0417  WBC 25.3*  HGB 8.1*  HCT 26.4*  PLT 68*   ------------------------------------------------------------------------------------------------------------------  Chemistries  Recent Labs  Lab 05/04/17 0432  05/06/17 0417  NA 154*   < > 143  K 3.4*   < >  4.2  CL 127*   < > 106  CO2 19*   < > 24  GLUCOSE 381*   < > 205*  BUN 107*   < > 103*  CREATININE 3.09*   < > 3.46*  CALCIUM 8.1*   < > 7.5*  MG 2.6*  --   --    < > = values in this interval not displayed.    ------------------------------------------------------------------------------------------------------------------  Cardiac Enzymes No results for input(s): TROPONINI in the last 168 hours. ------------------------------------------------------------------------------------------------------------------  RADIOLOGY:  Ct Head Wo Contrast  Result Date: 05/06/2017 CLINICAL DATA:  Respiratory failure.  Suspected cerebral hemorrhage. EXAM: CT HEAD WITHOUT CONTRAST TECHNIQUE: Contiguous axial images were obtained from the base of the skull through the vertex without intravenous contrast. COMPARISON:  April 20, 2017 FINDINGS: Brain: There is low-density with loss of gray-white differentiation in the left occipital lobe. There is no hydrocephalus or midline shift. No acute hemorrhage is identified. There is chronic small vessel ischemic change in the bilateral periventricular white matter more prominently in the left frontal white matter. Vascular: No hyperdense vessel or unexpected calcification. Skull: Normal. Negative for fracture or focal lesion. Sinuses/Orbits: No acute finding. Other: None. 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 No acute intracranial hemorrhage is noted. Electronically Signed   By: Abelardo Diesel M.D.   On: 05/06/2017 08:48   Dg Chest Port 1 View  Result Date: 05/05/2017 CLINICAL DATA:  Unsuccessful central line placement. Evaluate for pneumothorax. EXAM: PORTABLE CHEST 1 VIEW COMPARISON:  Chest x-ray from same day at 9:31 a.m. FINDINGS: Unchanged endotracheal tube, enteric tube, and left upper extremity PICC line. The heart size and mediastinal contours are within normal limits. Unchanged interstitial edema. No focal consolidation, pleural effusion, or pneumothorax. No acute osseous abnormality. IMPRESSION: 1. No pneumothorax. 2. Unchanged interstitial pulmonary edema. Electronically Signed   By: Titus Dubin M.D.   On:  05/05/2017 13:02   Dg Chest Port 1 View  Result Date: 05/05/2017 CLINICAL DATA:  Status post intubation EXAM: PORTABLE CHEST 1 VIEW COMPARISON:  05/03/2017 FINDINGS: Endotracheal tube and nasogastric catheter are again noted in satisfactory position. Left-sided PICC line is stable in position. The cardiac shadow is unremarkable. The lungs demonstrate some diffuse interstitial changes stable from the prior exam. No new focal abnormality is seen. IMPRESSION: Stable interstitial changes when compared with the prior study. Tubes and lines as described above. Electronically Signed   By: Inez Catalina M.D.   On: 05/05/2017 09:59    ASSESSMENT AND PLAN:  62 yo female w/ hx of DM, HTN, Hyperlipidemia, hx of Cirrhosis, Chronic Bronchitis, Anxiety, Anemia, GERD, Depression, PTSD, Chronic Diastolic CHF, hx of Panic Attack who presented to the hospital due to a fall and noted to have a Left Hip Fracture.   1. Acute hypoxic respiratory failure-secondary to  bronchospasm Intubated 3/8 Vent management per protocol, weaning trials as tolerated today by pulmonology, patient failed weaning trials in the past 2 days Secondary to bilateralHCAP,COPD,and CHFexacerbation  CT chest negative for PE, noted extensive bilateral groundglass opacities with broad differential, elevated pro-calcitonin level  Continue antibiotics, IV Solu-Medrol, mucolytic agents,RTfollowing,  pulmonologyfollowing, echo for EF 30-35%, cardiology input appreciated, continueLasix,  No significant improvement  2.Acute on chronic diastolic CHF with hypokalemia Continue current care  3.AcutebilateralHCAP Resolving Chest x-ray, CT chest noted Continue pneumonia protocol,  patient was given cefepime   4.Acute on COPD exacerbation Stable  IV Solu-Medrol  tapered, inhaled corticosteroids, supplemental oxygenprn,mucolytic agents  5.S/p  Fall and Left Hip Fracture s/pleft hip hemiarthroplastyby orthopedic  surgery  6.  Acute CVA Neurology has been consulted  7.Hypertension,chronic Stable on current regiment  8.Chronic diabetes mellitus type 2 Stable on current regiment  9.Chronic tobacco smoking abuse/dependency Cessation counseling and nicotine patch daily  10.Noncompliant with medical management  Prognosis very poor possible comfort care  All the records are reviewed and case discussed with Care Management/Social Workerr. Management plans discussed with the patient's x-husband at bed side  CODE STATUS: full  TOTAL TIME TAKING CARE OF THIS PATIENT: 33 minutes.     POSSIBLE D/C pending extubation   Dustin Flock M.D on 05/06/2017   Between 7am to 6pm - Pager - [phone]  After 6pm go to [email or web address] - password [email or web address] [facility]  Office  [phone]  CC: Primary care physician; Casilda Carls, MD  Note: This dictation was prepared with Dragon dictation along with smaller phrase technology. Any transcriptional errors that result from this process are unintentional.

## 2017-05-06 NOTE — Progress Notes (Signed)
[facility], [place] Critical Care Medicine Progess Note    SYNOPSIS   [name] is a 62 year old female with past medical history remarkable for COPD, status post hip fracture with left hip hemiarthroplasty with acute on chronic respiratory failure with superimposed pulmonary edema was transferred to the intensive care unit.   ASSESSMENT/PLAN   Respiratory failure.  On albuterol, Atrovent, Solu-Medrol.  Has failed SBTs over the past several days. Family wishes [place] to continue trying and not ready to go comfort care.  Emergent CT scan of the head performed revealed acute to subacute stroke left occipital area Consult him to neurology  Renal failure. Started on hemodialysis yesterday  Leukocytosis. Off of antibiotics at this time will follow closely  Anemia H&H is stable  Status post hip fracture repair  Will need to discuss with family. Worsening BUN/creatinine.  Critical care time 40 minutes   VENTILATOR SETTINGS: Vent Mode: PRVC FiO2 (%):  [35 %] 35 % Set Rate:  [20 bmp] 20 bmp Vt Set:  [450 mL] 450 mL PEEP:  [5 cmH20] 5 cmH20 Plateau Pressure:  [16 cmH20-21 cmH20] 16 cmH20  INTAKE / OUTPUT:  Intake/Output Summary (Last 24 hours) at 05/06/2017 0850 Last data filed at 05/06/2017 0600 Gross per 24 hour  Intake 3866.41 ml  Output 299 ml  Net 3567.41 ml    Name: Samantha Mcmillan MRN: [id number] DOB: Oct 18, 1955    ADMISSION DATE:  04/08/2017  SUBJECTIVE:   Over the last 24 hours, vascular catheter was placed for hemodialysis. Also patient this morning was noted to have dilated pupils, sent for stat CT scan of the head, pending report Is still on mechanical ventilation and on and off of pressors for hemodynamic support  VITAL SIGNS: Temp:  [98.2 F (36.8 C)-100.1 F (37.8 C)] 98.5 F (36.9 C) (03/14 0700) Pulse Rate:  [101-147] 124 (03/14 0800) Resp:  [18-26] 21 (03/14 0800) BP: (81-128)/(50-70) 107/60 (03/14 0800) SpO2:  [100 %] 100 % (03/14 0800) FiO2 (%):  [35  %] 35 % (03/14 0408) Weight:  [78.3 kg (172 lb 9.9 oz)-79.7 kg (175 lb 11.3 oz)] 78.3 kg (172 lb 9.9 oz) (03/14 0349)  PHYSICAL EXAMINATION: Physical Examination:   VS: BP 107/60   Pulse (!) 124   Temp 98.5 F (36.9 C) (Axillary)   Resp (!) 21   Ht 5\' 4"  (1.626 m)   Wt 78.3 kg (172 lb 9.9 oz)   LMP  (LMP Unknown)   SpO2 100%   BMI 29.63 kg/m   General Appearance: patient is sedated on mechanical ventilation Neuro:Limited exam HEENT: patient is orally intubated, oral gastric tube in place, trachea is midline Pulmonary: clear to auscultation CardiovascularNormal S1,S2.   Abdomen: positive bowel sounds, soft exam. Skin:   warm, no rashes, no ecchymosis  Extremities: normal, no cyanosis, clubbing.    LABORATORY PANEL:   CBC Recent Labs  Lab 05/06/17 0417  WBC 25.3*  HGB 8.1*  HCT 26.4*  PLT 68*    Chemistries  Recent Labs  Lab 05/04/17 0432  05/05/17 1541 05/06/17 0417  NA 154*   < > 149* 143  K 3.4*   < > 4.2 4.2  CL 127*   < > 115* 106  CO2 19*   < > 24 24  GLUCOSE 381*   < > 295* 205*  BUN 107*   < > 136* 103*  CREATININE 3.09*   < > 4.02* 3.46*  CALCIUM 8.1*   < > 7.9* 7.5*  MG 2.6*  --   --   --  PHOS 4.9*  4.9*  --  5.5*  --    < > = values in this interval not displayed.    Recent Labs  Lab 05/05/17 1224 05/05/17 1625 05/05/17 1928 05/06/17 0025 05/06/17 0400 05/06/17 0733  GLUCAP 281* 286* 198* 181* 198* 180*   Recent Labs  Lab 05/04/17 1130 05/05/17 0631 05/05/17 1038  PHART 7.16* 7.30* 7.22*  PCO2ART 45 41 53*  PO2ART 50* 73* 70*   Recent Labs  Lab 05/04/17 0432 05/05/17 1541  ALBUMIN 1.8* 1.9*    Cardiac Enzymes No results for input(s): TROPONINI in the last 168 hours.  RADIOLOGY:  Dg Chest Port 1 View  Result Date: 05/05/2017 CLINICAL DATA:  Unsuccessful central line placement. Evaluate for pneumothorax. EXAM: PORTABLE CHEST 1 VIEW COMPARISON:  Chest x-ray from same day at 9:31 a.m. FINDINGS: Unchanged endotracheal  tube, enteric tube, and left upper extremity PICC line. The heart size and mediastinal contours are within normal limits. Unchanged interstitial edema. No focal consolidation, pleural effusion, or pneumothorax. No acute osseous abnormality. IMPRESSION: 1. No pneumothorax. 2. Unchanged interstitial pulmonary edema. Electronically Signed   By: Titus Dubin M.D.   On: 05/05/2017 13:02   Dg Chest Port 1 View  Result Date: 05/05/2017 CLINICAL DATA:  Status post intubation EXAM: PORTABLE CHEST 1 VIEW COMPARISON:  05/03/2017 FINDINGS: Endotracheal tube and nasogastric catheter are again noted in satisfactory position. Left-sided PICC line is stable in position. The cardiac shadow is unremarkable. The lungs demonstrate some diffuse interstitial changes stable from the prior exam. No new focal abnormality is seen. IMPRESSION: Stable interstitial changes when compared with the prior study. Tubes and lines as described above. Electronically Signed   By: Inez Catalina M.D.   On: 05/05/2017 09:59   Hermelinda Dellen, DO  05/06/2017

## 2017-05-07 LAB — GLUCOSE, CAPILLARY: GLUCOSE-CAPILLARY: 235 mg/dL — AB (ref 65–99)

## 2017-05-07 MED ORDER — LORAZEPAM 2 MG/ML IJ SOLN
1.0000 mg | INTRAMUSCULAR | Status: DC | PRN
  Administered 2017-05-07: 1 mg via INTRAVENOUS
  Filled 2017-05-07: qty 1

## 2017-05-07 MED ORDER — MORPHINE 100MG IN NS 100ML (1MG/ML) PREMIX INFUSION
1.0000 mg/h | INTRAVENOUS | Status: DC
  Administered 2017-05-07: 1 mg/h via INTRAVENOUS

## 2017-05-10 LAB — QUANTIFERON-TB GOLD PLUS (RQFGPL)
QUANTIFERON TB1 AG VALUE: 0.01 [IU]/mL
QuantiFERON Mitogen Value: 0.06 IU/mL
QuantiFERON Nil Value: 0.02 IU/mL
QuantiFERON TB2 Ag Value: 0.01 IU/mL

## 2017-05-10 LAB — QUANTIFERON-TB GOLD PLUS: QuantiFERON-TB Gold Plus: UNDETERMINED

## 2017-05-12 LAB — BLOOD GAS, ARTERIAL
ACID-BASE DEFICIT: 5.8 mmol/L — AB (ref 0.0–2.0)
BICARBONATE: 20.2 mmol/L (ref 20.0–28.0)
FIO2: 35
MECHVT: 500 mL
Mechanical Rate: 20
O2 SAT: 92.7 %
PCO2 ART: 41 mmHg (ref 32.0–48.0)
PEEP/CPAP: 5 cmH2O
PH ART: 7.3 — AB (ref 7.350–7.450)
Patient temperature: 37
pO2, Arterial: 73 mmHg — ABNORMAL LOW (ref 83.0–108.0)

## 2017-05-24 NOTE — Death Summary Note (Signed)
Death summary Date of admission: 05-13-17 Deceased: 05/30/2017  Time: [facility] course:  Mrs. Kucharski was a 62 year old female with a past medical history to include cirrhosis, anemia, congestive heart failure, COPD, diabetes, chronic renal insufficiency, chronic diastolic heart failure, hypertension, hyperlipidemia who presented to the emergency department after mechanical fall positive for hip fracture, displaced left femoral neck. She was seen by orthopedic surgery and had a left hip hemiarthroplasty. atient developed acute hypoxemia with increase FiO2 requirements. She was treated for both diastolic heart failure along with pneumonia with cefepime and vancomycin and diuretic therapy.she underwent CT scan of the chest which was negative for pulmonary emboli but did show bilateral groundglass opacities.patient was subsequently moved to the intensive care unit for worsening shortness of breath and confusion felt to be secondary to hepatic encephalopathy.her respiratory status deteriorated, initially was started on BiPAP but unfortunately progressed to eventual respiratory failure requiring intubation and mechanical ventilation.she was treated for bronchospasm with Solu-Medrol and bronchodilators, pneumonia with broad-spectrum anabiotic coverage, diuresis as tolerated. Unfortunately patient's renal function deteriorated. Continued and ongoing discussions with family they wish to proceed with hemodialysis. A dialysis catheter was placed and she was started on hemodialysis and did not tolerate it well. Frequent episodes of hypotension and arrhythmias were noted.continued ongoing discussions with family and they came to the decision that she would not want to move further with long-term care such as tracheostomy or PEG tube placement. They wished her to go comfort care, stop dialysis and palliative extubation.  Patient is deceased at 32.  Hermelinda Dellen, D.O.

## 2017-05-24 NOTE — Progress Notes (Signed)
I spoke with the family at the bedside in the ICU.  I have read the neurology report and CT scan and am aware the patient has suffered a stroke.  The family has elected to proceed with extubation and comfort measures.  I have offered the family my condolences.  They may contact me with any questions or concerns.

## 2017-05-24 NOTE — Progress Notes (Signed)
Chaplain prayed silently for patient, family, and care team. The patient's son, Samantha Mcmillan, did not sleep last night. This may be an emotional factor, along with family dynamics, during today's procedure. Chaplain Lavina Hamman will be informed of the status.

## 2017-05-24 NOTE — Progress Notes (Signed)
Pt was suctioned prior to extubation for a no secretions. Per Dr. Council Mechanic order, she was extubated for comfort care.

## 2017-05-24 NOTE — Progress Notes (Signed)
   2017-06-06 1409  Clinical Encounter Type  Visited With Patient and family together  Visit Type Follow-up (unit secretary paged; comfort care begun)  [place] responded to page.  Met with patient and family, maintained pastoral presence and offered emotional support.  Family requested prayer and then space.  Chaplain offered prayer and then left the family to be together.

## 2017-05-24 NOTE — Progress Notes (Signed)
Pulmonary/critical care attending  Patient is presently comfort care. I spoke with the son this morning who was comfortable with the way things are going. He wishes [place] to wait until all family members have visited and then he will proceed with palliative extubation. Patient has adequate pain and anxiety control.  Hermelinda Dellen, D.O.

## 2017-05-24 NOTE — Progress Notes (Signed)
   05-13-2017 1018  Clinical Encounter Type  Visited With Family  Visit Type Follow-up  Spiritual Encounters  Spiritual Needs Emotional;Prayer   Chaplain followed up with patient family.  Patient's son and significant other tearful.  Family engaged in life review of patient's role of mother and how she took care of everyone.  Chaplain offered prayer with family.  Family open to chaplain checking back in with them.

## 2017-05-24 NOTE — Progress Notes (Signed)
[place] [facility] [place] NAME: Samantha Mcmillan    MR#:  [id number]  DATE OF BIRTH:  1955-11-06  SUBJECTIVE:  CHIEF COMPLAINT:   Chief Complaint  Patient presents with  . Hip Pain  . Fall  patient remains on the vent   REVIEW OF SYSTEMS:    ROS not obtainable as the patient is intubated  DRUG ALLERGIES:   Allergies  Allergen Reactions  . Iodine Shortness Of Breath  . Latex Anaphylaxis  . Other Rash, Other (See Comments), Shortness Of Breath, Anaphylaxis and Swelling    Pt states that she is allergic to Darvocet.   . Oxycodone Shortness Of Breath, Rash and Swelling    Other reaction(s): Other (See Comments) wheezing  . Oxycodone-Acetaminophen Other (See Comments), Rash, Swelling and Shortness Of Breath    wheezing wheezing  . Shellfish-Derived Products Anaphylaxis, Rash, Shortness Of Breath and Swelling    Throat closes.  . Amitriptyline Other (See Comments)    Other reaction(s): Other (See Comments) Mental changes Other reaction(s): Other (See Comments) Altered mental status, agitation Reaction:  Mental changes   . Fish-Derived Products Other (See Comments) and Swelling    Other reaction(s): Other (See Comments) "respiratory distress and wheezing" "respiratory distress and wheezing"  . Tape Other (See Comments)    Other reaction(s): Other (See Comments) Pulls skin off Pt states that it pulls her skin off.  Pt states that it pulls her skin off.   . Wellbutrin [Bupropion] Other (See Comments)    Other reaction(s): Other (See Comments) Mental changes Reaction:  Mental changes   . Augmentin [Amoxicillin-Pot Clavulanate] Rash and Other (See Comments)    Has patient had a PCN reaction causing immediate rash, facial/tongue/throat swelling, SOB or lightheadedness with hypotension: No Has patient had a PCN reaction causing severe rash involving mucus membranes or skin necrosis: No Has patient had a PCN reaction that required  hospitalization No Has patient had a PCN reaction occurring within the last 10 years: No If all of the above answers are "NO", then may proceed with Cephalosporin use.  . Codeine Rash  . Cortisone Rash  . Diphenhydramine Rash  . Hydrocodone-Acetaminophen Rash    dizzy  . Vicodin [Hydrocodone-Acetaminophen] Rash and Other (See Comments)    Reaction:  Dizziness      VITALS:  Blood pressure (!) 82/54, pulse (!) 101, temperature 98.2 F (36.8 C), temperature source Axillary, resp. rate (!) 23, height 5\' 4"  (1.626 m), weight 183 lb 6.8 oz (83.2 kg), SpO2 100 %.  PHYSICAL EXAMINATION:  GENERAL:  62 y.o.-year-old patient lying in the bed intubated and sedated EYES: Pupils equal, round, reactive to light and accommodation. No scleral icterus.  HEENT: Head atraumatic, normocephalic. OT intact NECK:  Supple, no jugular venous distention. No thyroid enlargement, no tenderness.  LUNGS: Mod breath sounds bilaterally, no wheezing, rales,rhonchi or crepitation. No use of accessory muscles of respiration.  CARDIOVASCULAR: S1, S2 normal. No murmurs, rubs, or gallops.  ABDOMEN: Soft, nontender, nondistended. Bowel sounds present.  EXTREMITIES: No pedal edema, cyanosis, or clubbing.  NEUROLOGIC: Intubated PSYCHIATRIC: The patient is sedated  sKIN: No obvious rash, lesion, or ulcer.   Physical Exam LABORATORY PANEL:   CBC Recent Labs  Lab 05/06/17 0417  WBC 25.3*  HGB 8.1*  HCT 26.4*  PLT 68*   ------------------------------------------------------------------------------------------------------------------  Chemistries  Recent Labs  Lab 05/04/17 0432  05/06/17 0417  NA 154*   < > 143  K 3.4*   < > 4.2  CL 127*   < > 106  CO2 19*   < > 24  GLUCOSE 381*   < > 205*  BUN 107*   < > 103*  CREATININE 3.09*   < > 3.46*  CALCIUM 8.1*   < > 7.5*  MG 2.6*  --   --    < > = values in this interval not displayed.    ------------------------------------------------------------------------------------------------------------------  Cardiac Enzymes No results for input(s): TROPONINI in the last 168 hours. ------------------------------------------------------------------------------------------------------------------  RADIOLOGY:  Ct Head Wo Contrast  Result Date: 05/06/2017 CLINICAL DATA:  Respiratory failure.  Suspected cerebral hemorrhage. EXAM: CT HEAD WITHOUT CONTRAST TECHNIQUE: Contiguous axial images were obtained from the base of the skull through the vertex without intravenous contrast. COMPARISON:  April 20, 2017 FINDINGS: Brain: There is low-density with loss of gray-white differentiation in the left occipital lobe. There is no hydrocephalus or midline shift. No acute hemorrhage is identified. There is chronic small vessel ischemic change in the bilateral periventricular white matter more prominently in the left frontal white matter. Vascular: No hyperdense vessel or unexpected calcification. Skull: Normal. Negative for fracture or focal lesion. Sinuses/Orbits: No acute finding. Other: None. 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 No acute intracranial hemorrhage is noted. Electronically Signed   By: Abelardo Diesel M.D.   On: 05/06/2017 08:48   Dg Chest Port 1 View  Result Date: 05/05/2017 CLINICAL DATA:  Unsuccessful central line placement. Evaluate for pneumothorax. EXAM: PORTABLE CHEST 1 VIEW COMPARISON:  Chest x-ray from same day at 9:31 a.m. FINDINGS: Unchanged endotracheal tube, enteric tube, and left upper extremity PICC line. The heart size and mediastinal contours are within normal limits. Unchanged interstitial edema. No focal consolidation, pleural effusion, or pneumothorax. No acute osseous abnormality. IMPRESSION: 1. No pneumothorax. 2. Unchanged interstitial pulmonary edema. Electronically Signed   By: Titus Dubin M.D.   On:  05/05/2017 13:02    ASSESSMENT AND PLAN:  62 yo female w/ hx of DM, HTN, Hyperlipidemia, hx of Cirrhosis, Chronic Bronchitis, Anxiety, Anemia, GERD, Depression, PTSD, Chronic Diastolic CHF, hx of Panic Attack who presented to the hospital due to a fall and noted to have a Left Hip Fracture.   1. Acute hypoxic respiratory failure-secondary to  bronchospasm Intubated 3/8 Vent management per protocol, weaning trials as tolerated today by pulmonology, patient failed weaning trials in the past 2 days Secondary to bilateralHCAP,COPD,and CHFexacerbation  CT chest negative for PE, noted extensive bilateral groundglass opacities with broad differential, elevated pro-calcitonin level  Continue antibiotics, IV Solu-Medrol, mucolytic agents,RTfollowing,  pulmonologyfollowing, echo for EF 30-35%, cardiology input appreciated, continueLasix,  No significant improvement Plan for terminal extubation today  2.Acute on chronic diastolic CHF with hypokalemia Continue current care  3.AcutebilateralHCAP Resolving Chest x-ray, CT chest noted Continue pneumonia protocol,  patient was given cefepime   4.Acute on COPD exacerbation Stable  IV Solu-Medrol  tapered, inhaled corticosteroids, supplemental oxygenprn,mucolytic agents Comfort care  5.S/p Fall and Left Hip Fracture s/pleft hip hemiarthroplastyby orthopedic surgery  6.  Acute CVA Neurology has been consulted  7.Hypertension,chronic Stable on current regiment  8.Chronic diabetes mellitus type 2 Stable on current regiment  9.Chronic tobacco smoking abuse/dependency   10.Noncompliant with medical management  Prognosis very poor possible comfort care  All the records are reviewed and case discussed with Care Management/Social Workerr. Management plans discussed with the patient's x-husband at bed side  CODE STATUS: full  TOTAL TIME TAKING CARE OF THIS PATIENT: 33 minutes.  POSSIBLE D/C  pending extubation   Dustin Flock M.D on 2017-05-29   Between 7am to 6pm - Pager - [phone]  After 6pm go to [email or web address] - password [email or web address] [facility]  Office  [phone]  CC: Primary care physician; Casilda Carls, MD  Note: This dictation was prepared with Dragon dictation along with smaller phrase technology. Any transcriptional errors that result from this process are unintentional.

## 2017-05-24 NOTE — Progress Notes (Signed)
Pt extubated to comfort care at 1355-placed on morphine gtt and given 1mg  Ativan. Pt pronounced at 1505. Family is at bedside. CDS & MD made aware. Pt is NOT a candidate for donation.

## 2017-05-24 NOTE — Care Management (Signed)
Dr. Mack Guise updated on patient status and plan to one-way extubate when family arrives.

## 2017-05-24 NOTE — Progress Notes (Signed)
Spoke with Gwenette Greet ME due to patient having a mechanical fall recently  Will see patient.

## 2017-05-24 DEATH — deceased

## 2017-06-11 ENCOUNTER — Ambulatory Visit: Payer: Medicare Other | Admitting: Internal Medicine

## 2017-06-11 ENCOUNTER — Other Ambulatory Visit: Payer: Medicare Other

## 2019-02-12 IMAGING — DX DG CHEST 1V PORT
1 series · 1 of 1 positions shown · non-contrast
Comparison: 04/26/2017, 04/24/2017, 12/27/2015

CLINICAL DATA: Cough

EXAM:
PORTABLE CHEST 1 VIEW

[chest ap]
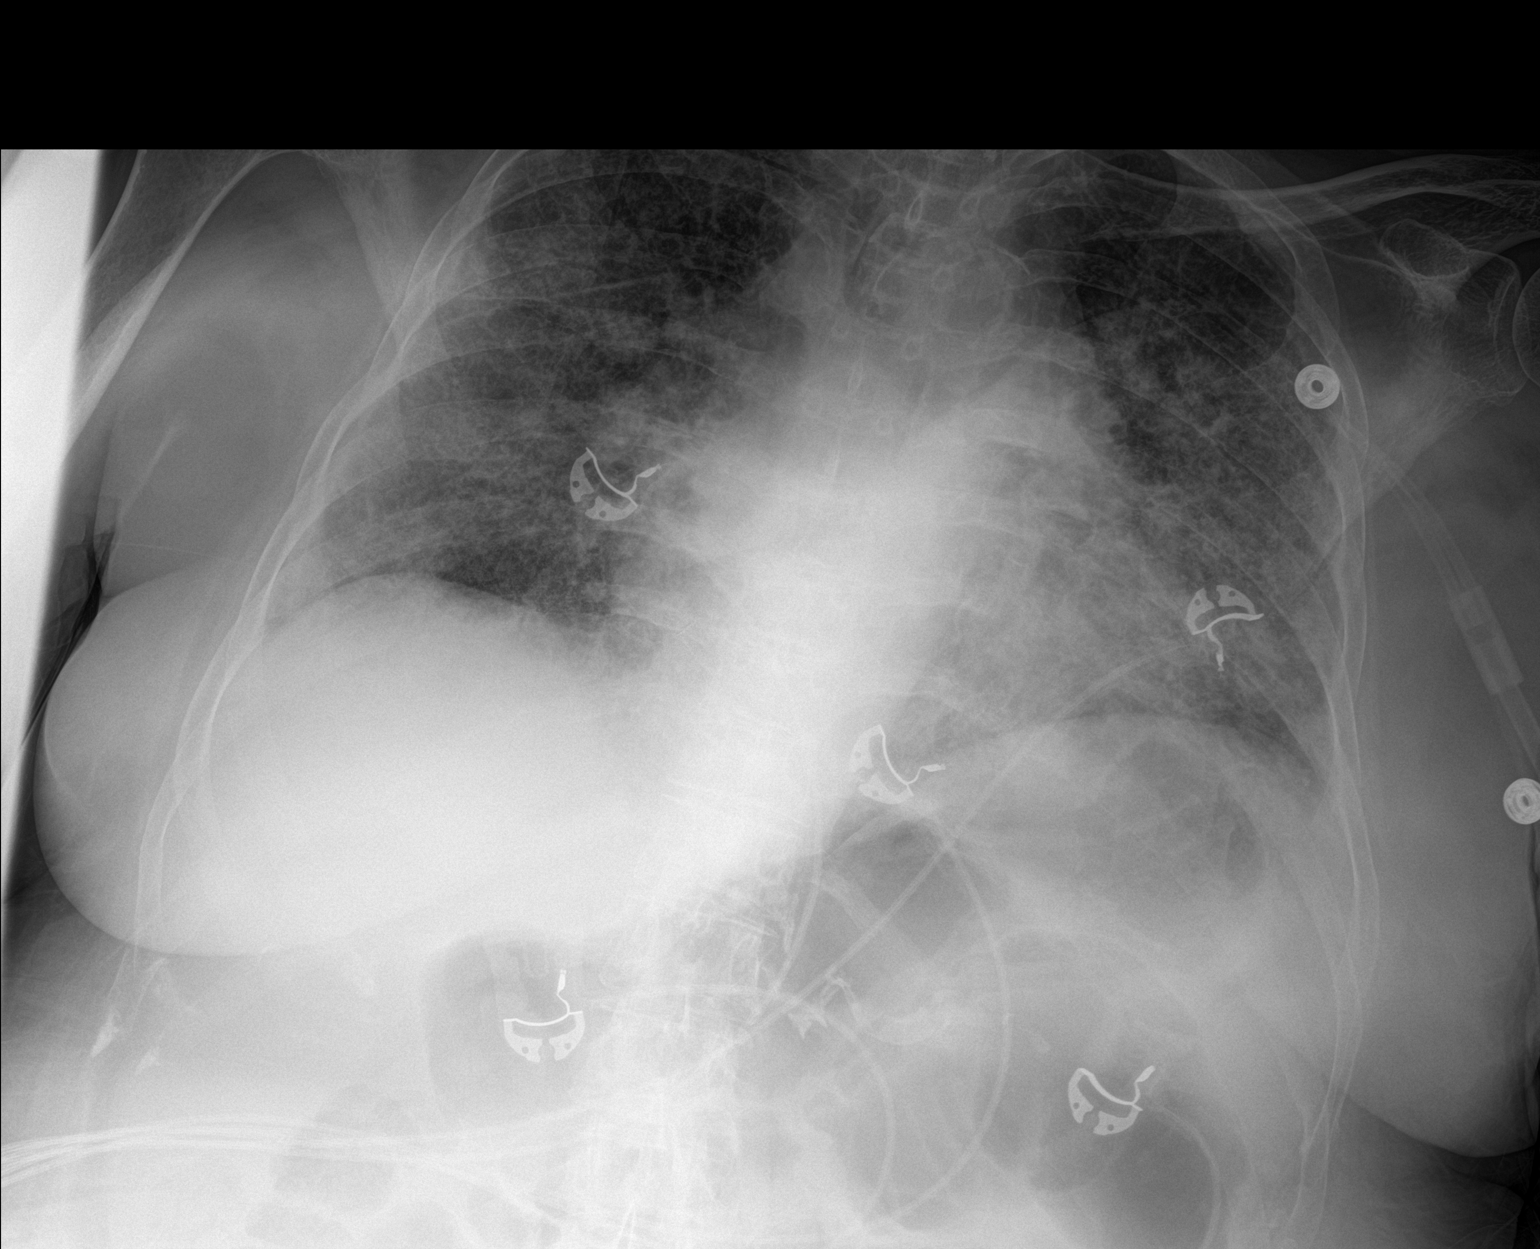

[1 of 1 positions shown; findings below may reference images not displayed]

FINDINGS: Diffuse bilateral interstitial and ground-glass opacity, similar
compared to prior. No pleural effusion. Stable enlarged
cardiomediastinal silhouette with aortic atherosclerosis. No
pneumothorax. Gas-filled enlarged bowel in the upper abdomen.
IMPRESSION: No significant interval change in diffuse interstitial and
ground-glass opacities, suspect for edema or diffuse pneumonia
superimposed on chronic change. Cardiomegaly.

## 2019-02-15 IMAGING — DX DG CHEST 1V PORT
1 series · 1 of 1 positions shown · non-contrast
Comparison: 05/01/2007

CLINICAL DATA: Acute respiratory failure

EXAM:
PORTABLE CHEST 1 VIEW

[chest ap]
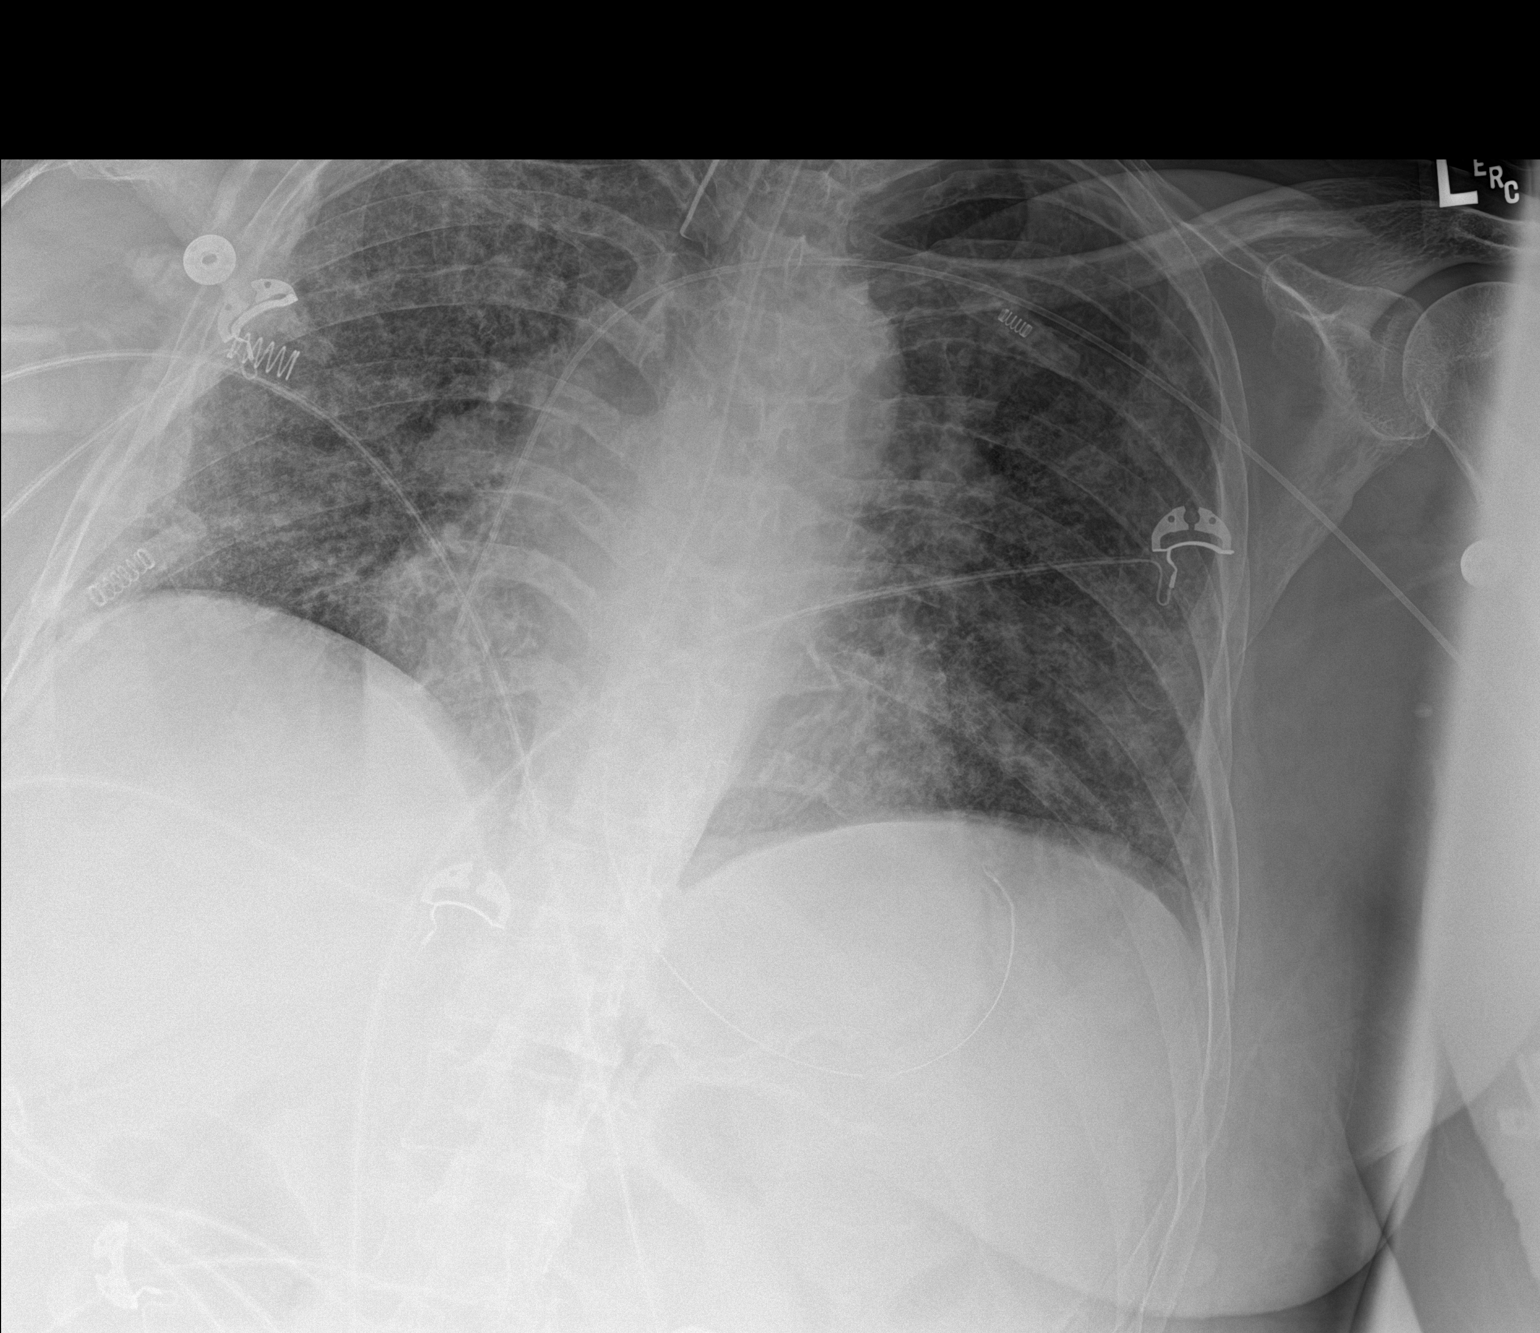

[1 of 1 positions shown; findings below may reference images not displayed]

FINDINGS: Endotracheal tube and NG tube are stable. Interval placement of left
PICC line with the tip in the upper right atrium. Diffuse
interstitial opacities throughout the lungs are stable.
Cardiomegaly. Low lung volumes. No visible effusions or acute bony
abnormality.
IMPRESSION: Diffuse interstitial prominence throughout the lungs, stable.

Cardiomegaly.

## 2019-02-16 IMAGING — DX DG CHEST 1V PORT
1 series · 1 of 1 positions shown · non-contrast
Comparison: 05/01/2017

CLINICAL DATA: Ventilator

EXAM:
PORTABLE CHEST 1 VIEW

[chest ap]
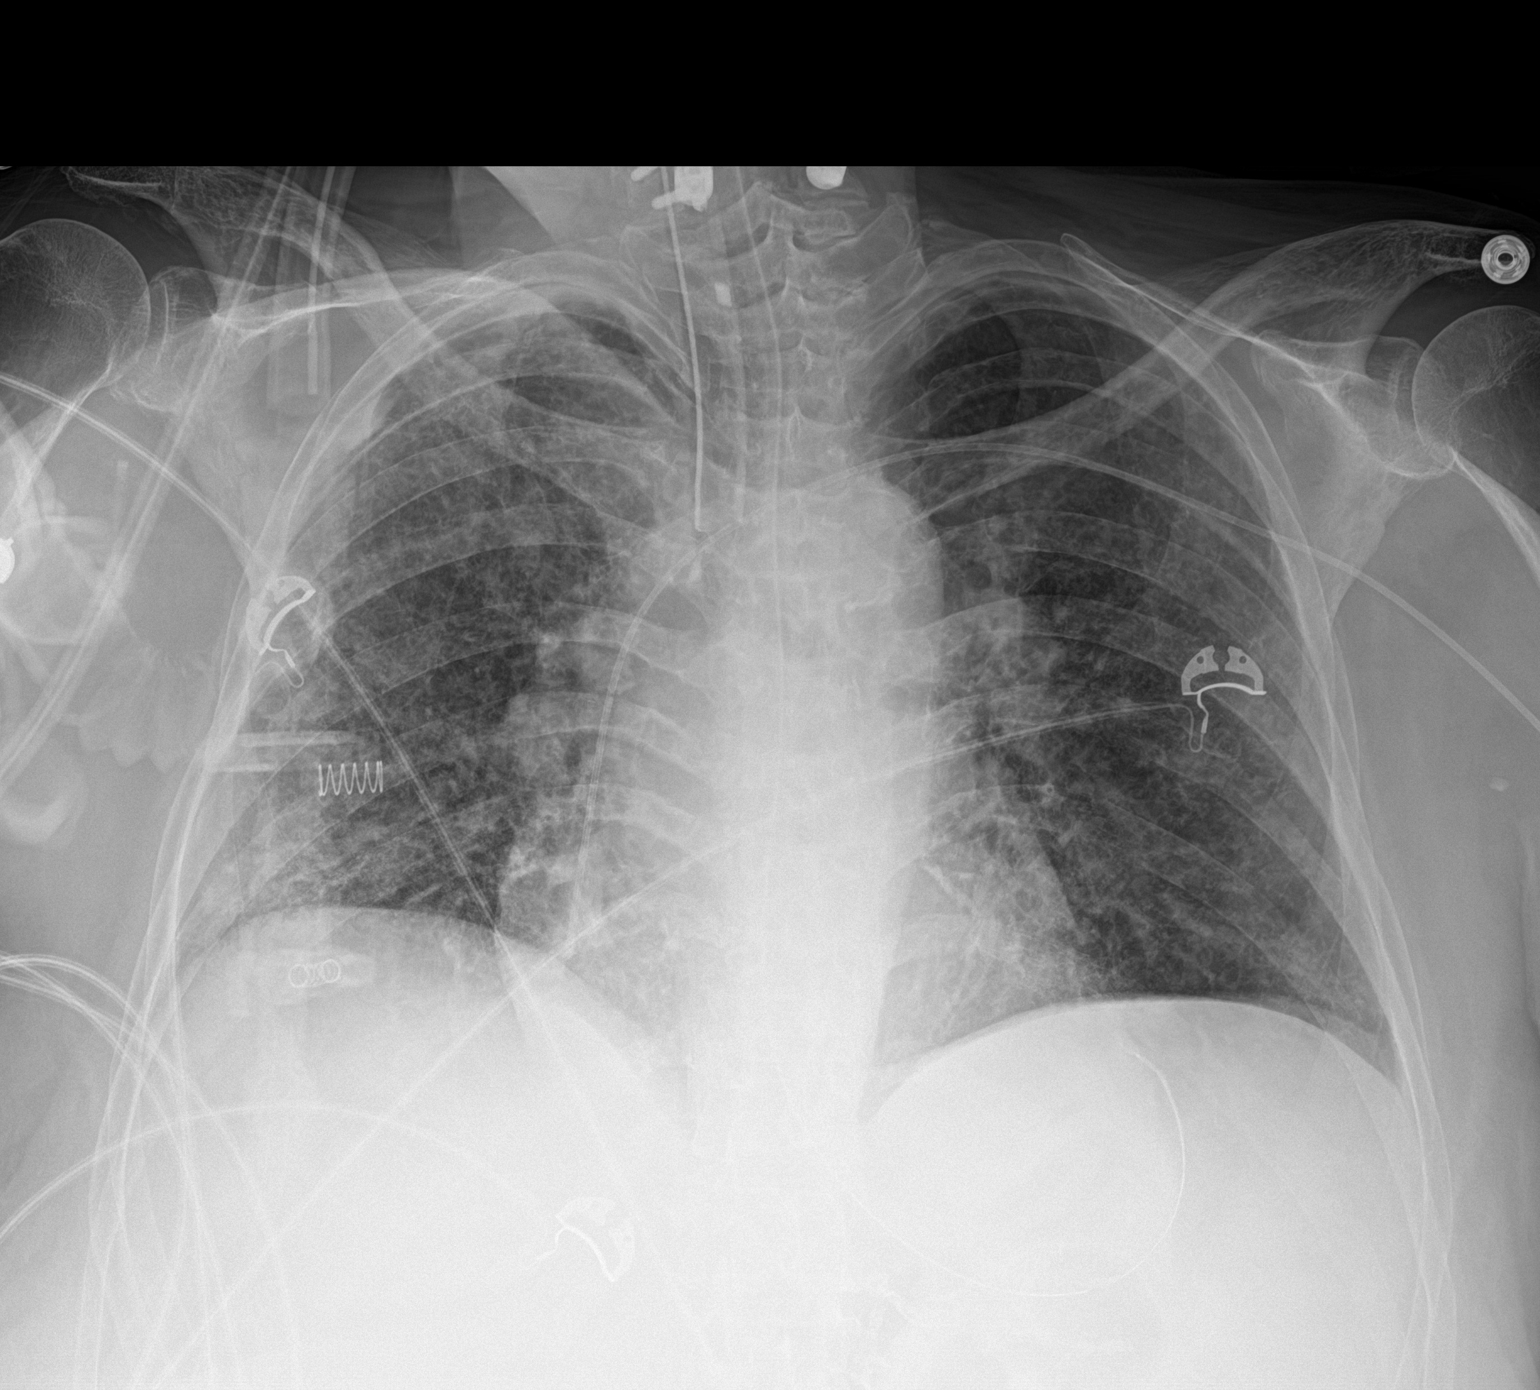

[1 of 1 positions shown; findings below may reference images not displayed]

FINDINGS: Endotracheal tube, left PICC line and NG tube are unchanged. Heart
is upper limits normal in size. Continued interstitial prominence in
the lungs, right greater than left, unchanged. No effusions or acute
bony abnormality.
IMPRESSION: Stable interstitial prominence within the lungs, right greater than
left.

## 2019-02-17 IMAGING — DX DG CHEST 1V PORT
1 series · 1 of 1 positions shown · non-contrast
Comparison: 05/02/2017

CLINICAL DATA: Respiratory failure

EXAM:
PORTABLE CHEST 1 VIEW

[chest ap]
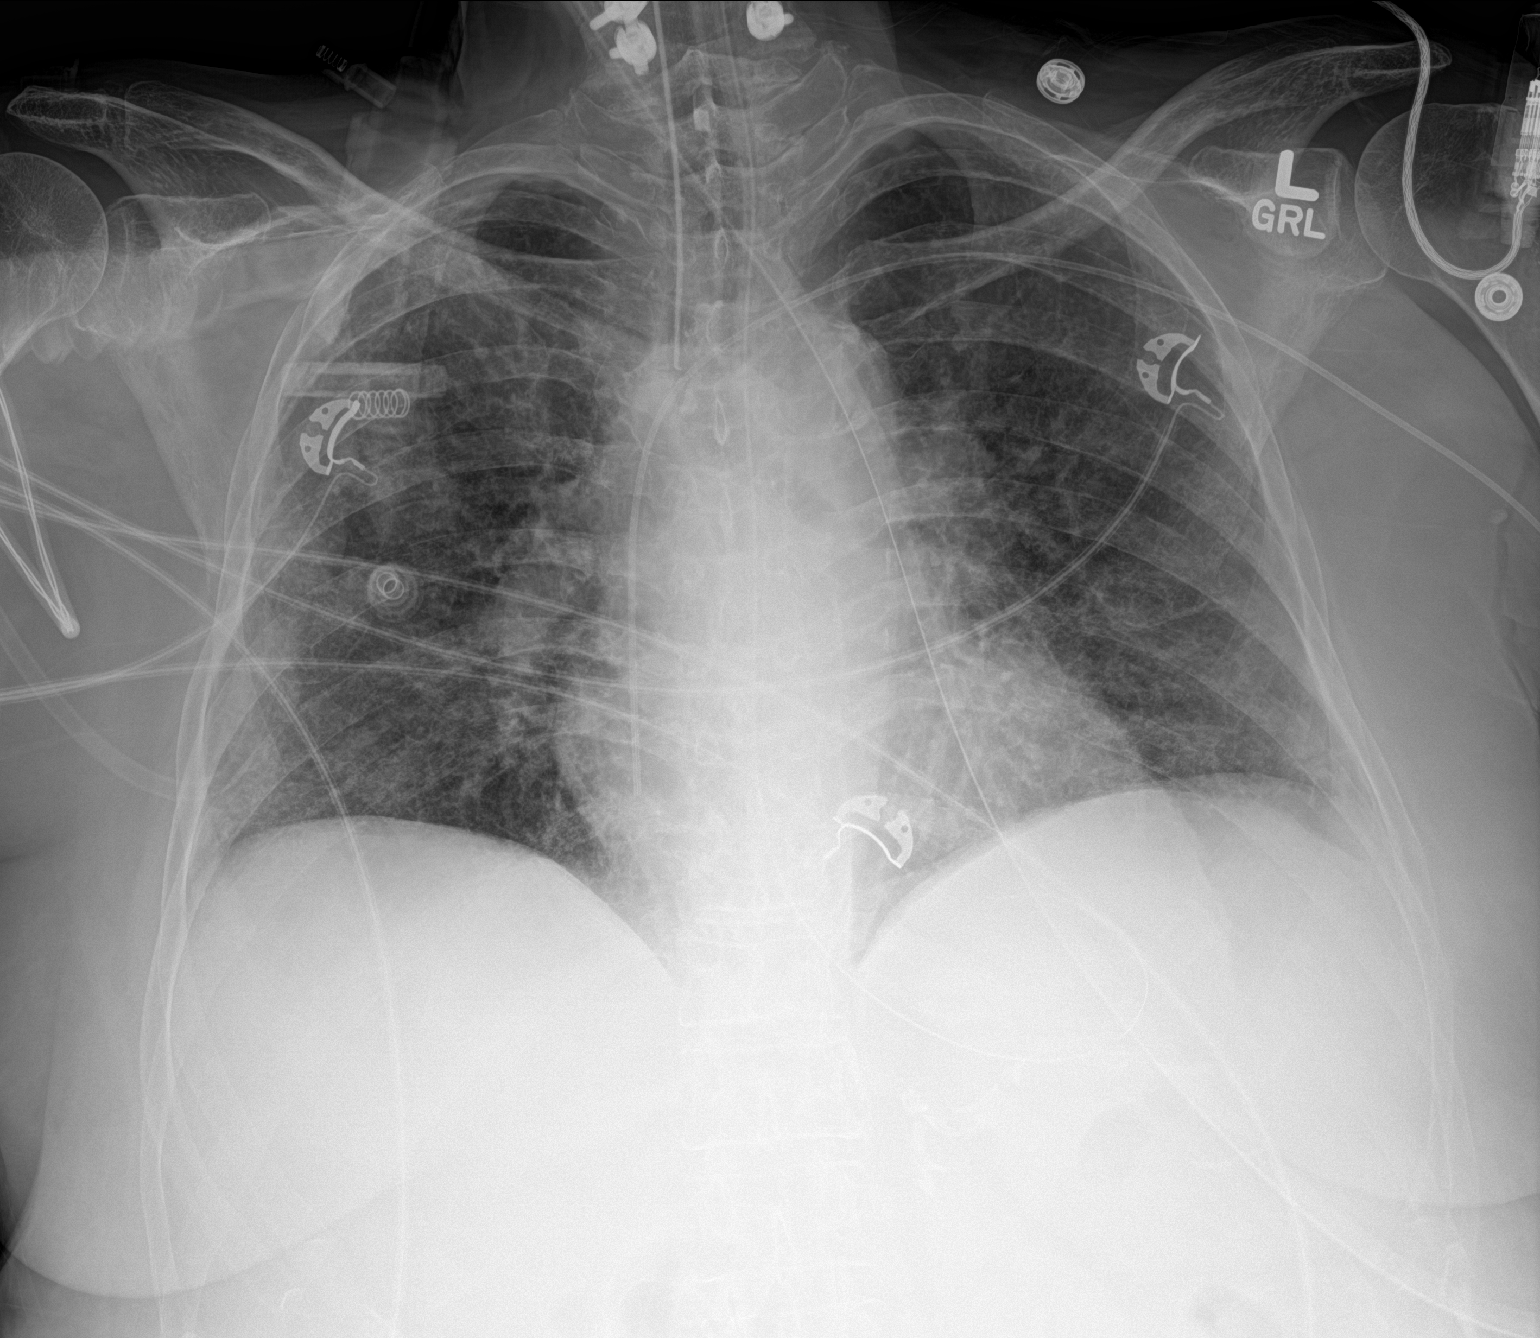

[1 of 1 positions shown; findings below may reference images not displayed]

FINDINGS: Endotracheal tube, NG tube, left PICC are stable. Interstitial edema
has improved. Low lung volumes. No pneumothorax. No pleural
effusion.
IMPRESSION: Improving interstitial edema.  Stable support apparatus.

## 2019-02-19 IMAGING — DX DG CHEST 1V PORT
1 series · 1 of 1 positions shown · non-contrast
Comparison: Chest x-ray from same day at [DATE] a.m..

CLINICAL DATA: Unsuccessful central line placement. Evaluate for
pneumothorax.

EXAM:
PORTABLE CHEST 1 VIEW

[chest ap]
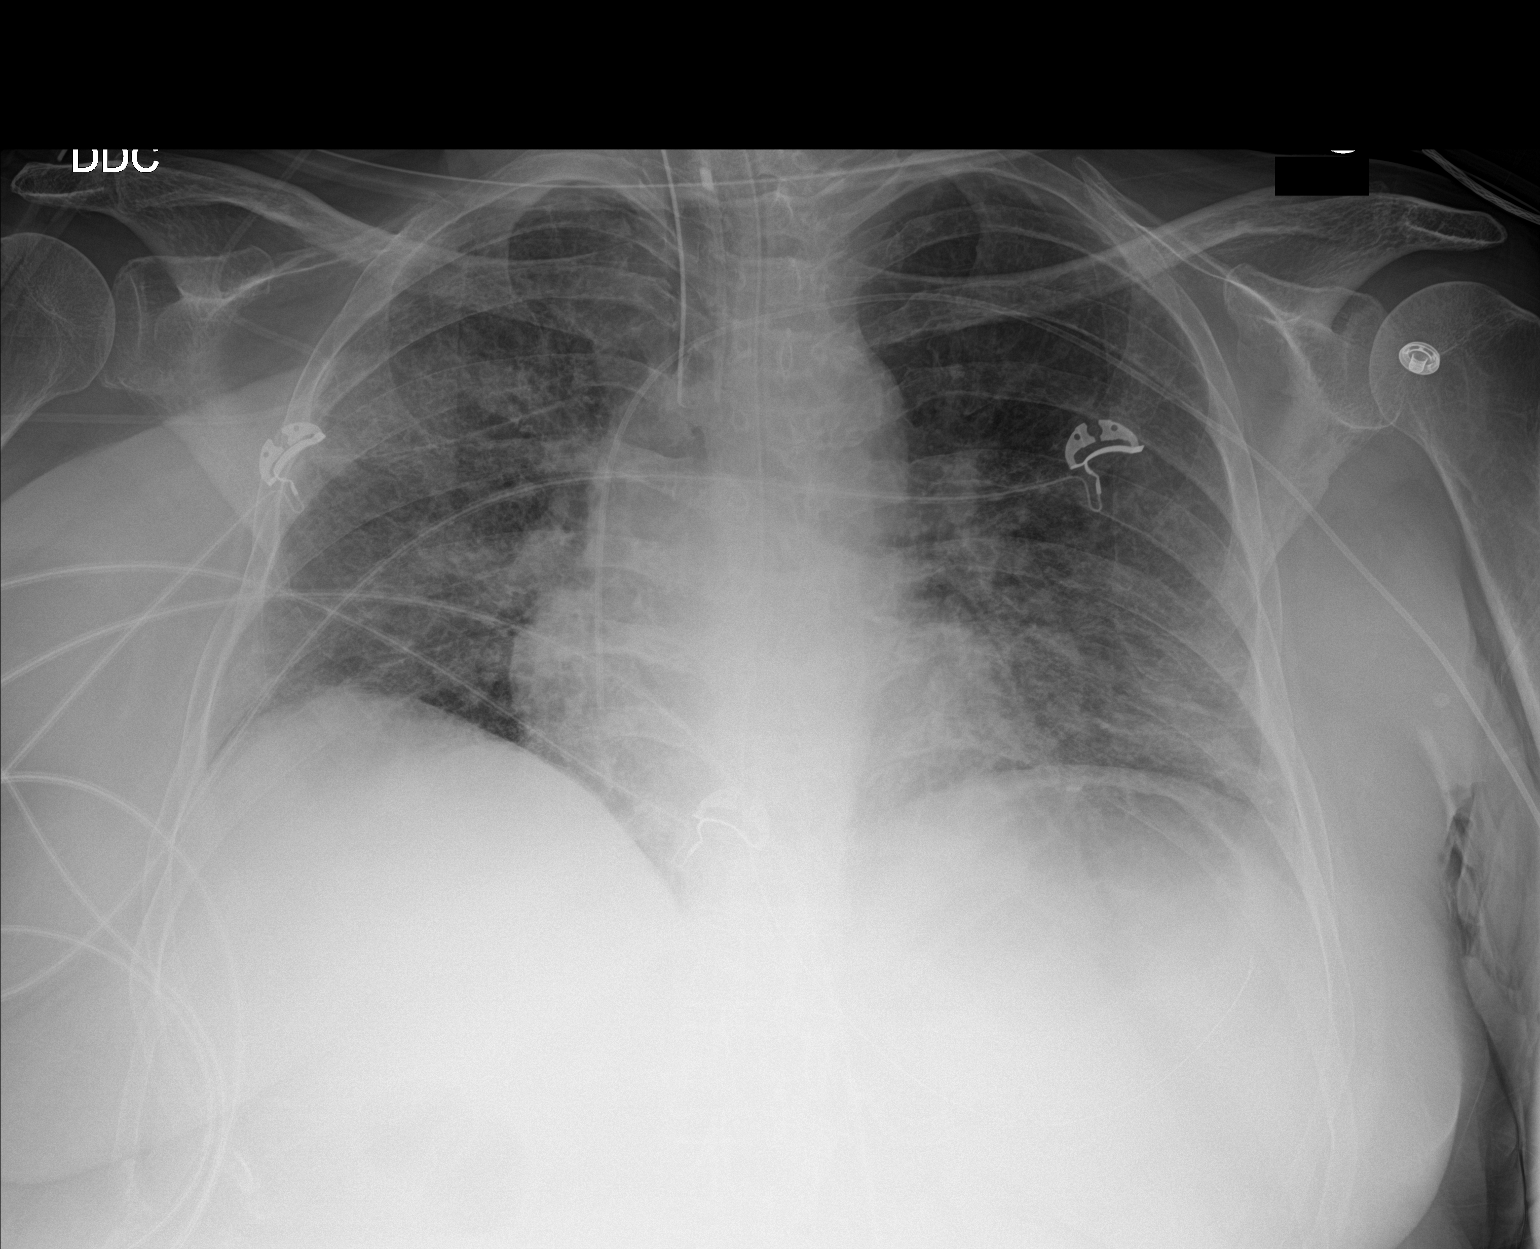

[1 of 1 positions shown; findings below may reference images not displayed]

FINDINGS: Unchanged endotracheal tube, enteric tube, and left upper extremity
PICC line. The heart size and mediastinal contours are within normal
limits. Unchanged interstitial edema. No focal consolidation,
pleural effusion, or pneumothorax. No acute osseous abnormality.
IMPRESSION: 1. No pneumothorax.
2. Unchanged interstitial pulmonary edema.
